# Patient Record
Sex: Male | Born: 1940
Health system: Southern US, Community
[De-identification: ages and names within clinical notes are randomized; demographics above are authoritative.]

## PROBLEM LIST (undated history)

## (undated) DIAGNOSIS — N289 Disorder of kidney and ureter, unspecified: Secondary | ICD-10-CM

## (undated) DIAGNOSIS — M109 Gout, unspecified: Secondary | ICD-10-CM

## (undated) DIAGNOSIS — F1910 Other psychoactive substance abuse, uncomplicated: Secondary | ICD-10-CM

## (undated) DIAGNOSIS — K469 Unspecified abdominal hernia without obstruction or gangrene: Secondary | ICD-10-CM

## (undated) DIAGNOSIS — Z950 Presence of cardiac pacemaker: Secondary | ICD-10-CM

## (undated) DIAGNOSIS — C801 Malignant (primary) neoplasm, unspecified: Secondary | ICD-10-CM

## (undated) DIAGNOSIS — K2270 Barrett's esophagus without dysplasia: Secondary | ICD-10-CM

## (undated) DIAGNOSIS — R011 Cardiac murmur, unspecified: Secondary | ICD-10-CM

## (undated) DIAGNOSIS — I4891 Unspecified atrial fibrillation: Secondary | ICD-10-CM

## (undated) DIAGNOSIS — J449 Chronic obstructive pulmonary disease, unspecified: Secondary | ICD-10-CM

## (undated) DIAGNOSIS — I10 Essential (primary) hypertension: Secondary | ICD-10-CM

## (undated) DIAGNOSIS — R51 Headache: Secondary | ICD-10-CM

## (undated) DIAGNOSIS — Z952 Presence of prosthetic heart valve: Secondary | ICD-10-CM

## (undated) DIAGNOSIS — N403 Nodular prostate with lower urinary tract symptoms: Secondary | ICD-10-CM

## (undated) DIAGNOSIS — E785 Hyperlipidemia, unspecified: Secondary | ICD-10-CM

## (undated) DIAGNOSIS — I499 Cardiac arrhythmia, unspecified: Secondary | ICD-10-CM

## (undated) DIAGNOSIS — R519 Headache, unspecified: Secondary | ICD-10-CM

## (undated) DIAGNOSIS — I509 Heart failure, unspecified: Secondary | ICD-10-CM

## (undated) DIAGNOSIS — I351 Nonrheumatic aortic (valve) insufficiency: Secondary | ICD-10-CM

## (undated) DIAGNOSIS — K5790 Diverticulosis of intestine, part unspecified, without perforation or abscess without bleeding: Secondary | ICD-10-CM

## (undated) DIAGNOSIS — R319 Hematuria, unspecified: Secondary | ICD-10-CM

## (undated) DIAGNOSIS — N138 Other obstructive and reflux uropathy: Secondary | ICD-10-CM

## (undated) DIAGNOSIS — Z95810 Presence of automatic (implantable) cardiac defibrillator: Secondary | ICD-10-CM

## (undated) DIAGNOSIS — K219 Gastro-esophageal reflux disease without esophagitis: Secondary | ICD-10-CM

## (undated) HISTORY — DX: Unspecified atrial fibrillation: I48.91

## (undated) HISTORY — DX: Gout, unspecified: M10.9

## (undated) HISTORY — DX: Essential (primary) hypertension: I10

## (undated) HISTORY — PX: INSERT / REPLACE / REMOVE PACEMAKER: SUR710

## (undated) HISTORY — DX: Barrett's esophagus without dysplasia: K22.70

## (undated) HISTORY — DX: Hematuria, unspecified: R31.9

## (undated) HISTORY — DX: Nodular prostate with lower urinary tract symptoms: N40.3

## (undated) HISTORY — DX: Chronic obstructive pulmonary disease, unspecified: J44.9

## (undated) HISTORY — PX: VASECTOMY: SHX75

## (undated) HISTORY — DX: Gastro-esophageal reflux disease without esophagitis: K21.9

## (undated) HISTORY — PX: CORONARY ANGIOPLASTY: SHX604

## (undated) HISTORY — DX: Diverticulosis of intestine, part unspecified, without perforation or abscess without bleeding: K57.90

## (undated) HISTORY — PX: EYE SURGERY: SHX253

## (undated) HISTORY — PX: FOOT SURGERY: SHX648

## (undated) HISTORY — DX: Other psychoactive substance abuse, uncomplicated: F19.10

## (undated) HISTORY — DX: Other obstructive and reflux uropathy: N13.8

## (undated) HISTORY — PX: FRACTURE SURGERY: SHX138

## (undated) HISTORY — DX: Nonrheumatic aortic (valve) insufficiency: I35.1

## (undated) HISTORY — DX: Hyperlipidemia, unspecified: E78.5

## (undated) HISTORY — DX: Presence of prosthetic heart valve: Z95.2

---

## 2002-11-29 HISTORY — PX: CARDIAC VALVE REPLACEMENT: SHX585

## 2007-02-15 ENCOUNTER — Ambulatory Visit (HOSPITAL_COMMUNITY): Admission: RE | Admit: 2007-02-15 | Discharge: 2007-02-15 | Payer: Self-pay | Admitting: *Deleted

## 2007-08-05 ENCOUNTER — Ambulatory Visit: Payer: Self-pay

## 2007-10-25 ENCOUNTER — Ambulatory Visit: Payer: Self-pay | Admitting: General Practice

## 2007-11-08 ENCOUNTER — Ambulatory Visit: Payer: Self-pay | Admitting: General Practice

## 2008-02-14 ENCOUNTER — Ambulatory Visit: Payer: Self-pay | Admitting: Unknown Physician Specialty

## 2008-09-23 ENCOUNTER — Inpatient Hospital Stay: Payer: Self-pay | Admitting: Cardiology

## 2009-01-06 ENCOUNTER — Inpatient Hospital Stay: Payer: Self-pay | Admitting: Cardiology

## 2009-08-29 ENCOUNTER — Ambulatory Visit: Payer: Self-pay | Admitting: Internal Medicine

## 2009-09-15 ENCOUNTER — Ambulatory Visit: Payer: Self-pay | Admitting: Internal Medicine

## 2009-09-29 ENCOUNTER — Ambulatory Visit: Payer: Self-pay | Admitting: Internal Medicine

## 2010-02-27 ENCOUNTER — Ambulatory Visit: Payer: Self-pay | Admitting: Internal Medicine

## 2010-03-16 ENCOUNTER — Ambulatory Visit: Payer: Self-pay | Admitting: Internal Medicine

## 2010-03-29 ENCOUNTER — Ambulatory Visit: Payer: Self-pay | Admitting: Internal Medicine

## 2011-03-17 ENCOUNTER — Ambulatory Visit: Payer: Self-pay | Admitting: Internal Medicine

## 2011-03-30 ENCOUNTER — Ambulatory Visit: Payer: Self-pay | Admitting: Internal Medicine

## 2011-09-06 ENCOUNTER — Ambulatory Visit: Payer: Self-pay | Admitting: Unknown Physician Specialty

## 2011-09-08 LAB — PATHOLOGY REPORT

## 2011-11-30 HISTORY — PX: PACEMAKER IMPLANT: EP1218

## 2011-12-02 DIAGNOSIS — H2510 Age-related nuclear cataract, unspecified eye: Secondary | ICD-10-CM | POA: Diagnosis not present

## 2011-12-09 DIAGNOSIS — M9981 Other biomechanical lesions of cervical region: Secondary | ICD-10-CM | POA: Diagnosis not present

## 2011-12-09 DIAGNOSIS — M5137 Other intervertebral disc degeneration, lumbosacral region: Secondary | ICD-10-CM | POA: Diagnosis not present

## 2011-12-09 DIAGNOSIS — M999 Biomechanical lesion, unspecified: Secondary | ICD-10-CM | POA: Diagnosis not present

## 2011-12-09 DIAGNOSIS — M5030 Other cervical disc degeneration, unspecified cervical region: Secondary | ICD-10-CM | POA: Diagnosis not present

## 2011-12-10 DIAGNOSIS — R609 Edema, unspecified: Secondary | ICD-10-CM | POA: Diagnosis not present

## 2011-12-10 DIAGNOSIS — D472 Monoclonal gammopathy: Secondary | ICD-10-CM | POA: Diagnosis not present

## 2011-12-10 DIAGNOSIS — I10 Essential (primary) hypertension: Secondary | ICD-10-CM | POA: Diagnosis not present

## 2011-12-13 DIAGNOSIS — M999 Biomechanical lesion, unspecified: Secondary | ICD-10-CM | POA: Diagnosis not present

## 2011-12-13 DIAGNOSIS — M5030 Other cervical disc degeneration, unspecified cervical region: Secondary | ICD-10-CM | POA: Diagnosis not present

## 2011-12-13 DIAGNOSIS — M5137 Other intervertebral disc degeneration, lumbosacral region: Secondary | ICD-10-CM | POA: Diagnosis not present

## 2011-12-13 DIAGNOSIS — M9981 Other biomechanical lesions of cervical region: Secondary | ICD-10-CM | POA: Diagnosis not present

## 2011-12-14 DIAGNOSIS — M9981 Other biomechanical lesions of cervical region: Secondary | ICD-10-CM | POA: Diagnosis not present

## 2011-12-14 DIAGNOSIS — M5030 Other cervical disc degeneration, unspecified cervical region: Secondary | ICD-10-CM | POA: Diagnosis not present

## 2011-12-14 DIAGNOSIS — M999 Biomechanical lesion, unspecified: Secondary | ICD-10-CM | POA: Diagnosis not present

## 2011-12-14 DIAGNOSIS — M5137 Other intervertebral disc degeneration, lumbosacral region: Secondary | ICD-10-CM | POA: Diagnosis not present

## 2011-12-15 DIAGNOSIS — M5030 Other cervical disc degeneration, unspecified cervical region: Secondary | ICD-10-CM | POA: Diagnosis not present

## 2011-12-15 DIAGNOSIS — M999 Biomechanical lesion, unspecified: Secondary | ICD-10-CM | POA: Diagnosis not present

## 2011-12-15 DIAGNOSIS — M5137 Other intervertebral disc degeneration, lumbosacral region: Secondary | ICD-10-CM | POA: Diagnosis not present

## 2011-12-15 DIAGNOSIS — M9981 Other biomechanical lesions of cervical region: Secondary | ICD-10-CM | POA: Diagnosis not present

## 2011-12-21 DIAGNOSIS — I4891 Unspecified atrial fibrillation: Secondary | ICD-10-CM | POA: Diagnosis not present

## 2011-12-21 DIAGNOSIS — Z954 Presence of other heart-valve replacement: Secondary | ICD-10-CM | POA: Diagnosis not present

## 2011-12-23 DIAGNOSIS — M5030 Other cervical disc degeneration, unspecified cervical region: Secondary | ICD-10-CM | POA: Diagnosis not present

## 2011-12-23 DIAGNOSIS — M5137 Other intervertebral disc degeneration, lumbosacral region: Secondary | ICD-10-CM | POA: Diagnosis not present

## 2011-12-23 DIAGNOSIS — M9981 Other biomechanical lesions of cervical region: Secondary | ICD-10-CM | POA: Diagnosis not present

## 2011-12-23 DIAGNOSIS — M999 Biomechanical lesion, unspecified: Secondary | ICD-10-CM | POA: Diagnosis not present

## 2011-12-24 DIAGNOSIS — M9981 Other biomechanical lesions of cervical region: Secondary | ICD-10-CM | POA: Diagnosis not present

## 2011-12-24 DIAGNOSIS — M5030 Other cervical disc degeneration, unspecified cervical region: Secondary | ICD-10-CM | POA: Diagnosis not present

## 2011-12-24 DIAGNOSIS — M5137 Other intervertebral disc degeneration, lumbosacral region: Secondary | ICD-10-CM | POA: Diagnosis not present

## 2011-12-24 DIAGNOSIS — M999 Biomechanical lesion, unspecified: Secondary | ICD-10-CM | POA: Diagnosis not present

## 2011-12-27 DIAGNOSIS — M999 Biomechanical lesion, unspecified: Secondary | ICD-10-CM | POA: Diagnosis not present

## 2011-12-27 DIAGNOSIS — M9981 Other biomechanical lesions of cervical region: Secondary | ICD-10-CM | POA: Diagnosis not present

## 2011-12-27 DIAGNOSIS — M5137 Other intervertebral disc degeneration, lumbosacral region: Secondary | ICD-10-CM | POA: Diagnosis not present

## 2011-12-27 DIAGNOSIS — M5030 Other cervical disc degeneration, unspecified cervical region: Secondary | ICD-10-CM | POA: Diagnosis not present

## 2011-12-29 DIAGNOSIS — M999 Biomechanical lesion, unspecified: Secondary | ICD-10-CM | POA: Diagnosis not present

## 2011-12-29 DIAGNOSIS — M9981 Other biomechanical lesions of cervical region: Secondary | ICD-10-CM | POA: Diagnosis not present

## 2011-12-29 DIAGNOSIS — M5030 Other cervical disc degeneration, unspecified cervical region: Secondary | ICD-10-CM | POA: Diagnosis not present

## 2011-12-29 DIAGNOSIS — M5137 Other intervertebral disc degeneration, lumbosacral region: Secondary | ICD-10-CM | POA: Diagnosis not present

## 2011-12-30 DIAGNOSIS — M5137 Other intervertebral disc degeneration, lumbosacral region: Secondary | ICD-10-CM | POA: Diagnosis not present

## 2011-12-30 DIAGNOSIS — M999 Biomechanical lesion, unspecified: Secondary | ICD-10-CM | POA: Diagnosis not present

## 2011-12-30 DIAGNOSIS — M5030 Other cervical disc degeneration, unspecified cervical region: Secondary | ICD-10-CM | POA: Diagnosis not present

## 2011-12-30 DIAGNOSIS — M9981 Other biomechanical lesions of cervical region: Secondary | ICD-10-CM | POA: Diagnosis not present

## 2012-01-03 DIAGNOSIS — M9981 Other biomechanical lesions of cervical region: Secondary | ICD-10-CM | POA: Diagnosis not present

## 2012-01-03 DIAGNOSIS — M5030 Other cervical disc degeneration, unspecified cervical region: Secondary | ICD-10-CM | POA: Diagnosis not present

## 2012-01-03 DIAGNOSIS — M5137 Other intervertebral disc degeneration, lumbosacral region: Secondary | ICD-10-CM | POA: Diagnosis not present

## 2012-01-03 DIAGNOSIS — M999 Biomechanical lesion, unspecified: Secondary | ICD-10-CM | POA: Diagnosis not present

## 2012-01-05 DIAGNOSIS — M999 Biomechanical lesion, unspecified: Secondary | ICD-10-CM | POA: Diagnosis not present

## 2012-01-05 DIAGNOSIS — M5030 Other cervical disc degeneration, unspecified cervical region: Secondary | ICD-10-CM | POA: Diagnosis not present

## 2012-01-05 DIAGNOSIS — M5137 Other intervertebral disc degeneration, lumbosacral region: Secondary | ICD-10-CM | POA: Diagnosis not present

## 2012-01-05 DIAGNOSIS — M9981 Other biomechanical lesions of cervical region: Secondary | ICD-10-CM | POA: Diagnosis not present

## 2012-01-06 DIAGNOSIS — M9981 Other biomechanical lesions of cervical region: Secondary | ICD-10-CM | POA: Diagnosis not present

## 2012-01-06 DIAGNOSIS — M999 Biomechanical lesion, unspecified: Secondary | ICD-10-CM | POA: Diagnosis not present

## 2012-01-06 DIAGNOSIS — M5137 Other intervertebral disc degeneration, lumbosacral region: Secondary | ICD-10-CM | POA: Diagnosis not present

## 2012-01-06 DIAGNOSIS — M5030 Other cervical disc degeneration, unspecified cervical region: Secondary | ICD-10-CM | POA: Diagnosis not present

## 2012-01-10 DIAGNOSIS — M5137 Other intervertebral disc degeneration, lumbosacral region: Secondary | ICD-10-CM | POA: Diagnosis not present

## 2012-01-10 DIAGNOSIS — M9981 Other biomechanical lesions of cervical region: Secondary | ICD-10-CM | POA: Diagnosis not present

## 2012-01-10 DIAGNOSIS — M5030 Other cervical disc degeneration, unspecified cervical region: Secondary | ICD-10-CM | POA: Diagnosis not present

## 2012-01-10 DIAGNOSIS — M999 Biomechanical lesion, unspecified: Secondary | ICD-10-CM | POA: Diagnosis not present

## 2012-01-10 DIAGNOSIS — Z95810 Presence of automatic (implantable) cardiac defibrillator: Secondary | ICD-10-CM | POA: Diagnosis not present

## 2012-01-12 DIAGNOSIS — M9981 Other biomechanical lesions of cervical region: Secondary | ICD-10-CM | POA: Diagnosis not present

## 2012-01-12 DIAGNOSIS — M999 Biomechanical lesion, unspecified: Secondary | ICD-10-CM | POA: Diagnosis not present

## 2012-01-12 DIAGNOSIS — M5137 Other intervertebral disc degeneration, lumbosacral region: Secondary | ICD-10-CM | POA: Diagnosis not present

## 2012-01-12 DIAGNOSIS — M5030 Other cervical disc degeneration, unspecified cervical region: Secondary | ICD-10-CM | POA: Diagnosis not present

## 2012-01-13 DIAGNOSIS — M5137 Other intervertebral disc degeneration, lumbosacral region: Secondary | ICD-10-CM | POA: Diagnosis not present

## 2012-01-13 DIAGNOSIS — M9981 Other biomechanical lesions of cervical region: Secondary | ICD-10-CM | POA: Diagnosis not present

## 2012-01-13 DIAGNOSIS — M5030 Other cervical disc degeneration, unspecified cervical region: Secondary | ICD-10-CM | POA: Diagnosis not present

## 2012-01-13 DIAGNOSIS — M999 Biomechanical lesion, unspecified: Secondary | ICD-10-CM | POA: Diagnosis not present

## 2012-01-17 DIAGNOSIS — M5030 Other cervical disc degeneration, unspecified cervical region: Secondary | ICD-10-CM | POA: Diagnosis not present

## 2012-01-17 DIAGNOSIS — M5137 Other intervertebral disc degeneration, lumbosacral region: Secondary | ICD-10-CM | POA: Diagnosis not present

## 2012-01-17 DIAGNOSIS — M9981 Other biomechanical lesions of cervical region: Secondary | ICD-10-CM | POA: Diagnosis not present

## 2012-01-17 DIAGNOSIS — M999 Biomechanical lesion, unspecified: Secondary | ICD-10-CM | POA: Diagnosis not present

## 2012-01-18 DIAGNOSIS — I4891 Unspecified atrial fibrillation: Secondary | ICD-10-CM | POA: Diagnosis not present

## 2012-01-18 DIAGNOSIS — Z954 Presence of other heart-valve replacement: Secondary | ICD-10-CM | POA: Diagnosis not present

## 2012-01-19 DIAGNOSIS — M5137 Other intervertebral disc degeneration, lumbosacral region: Secondary | ICD-10-CM | POA: Diagnosis not present

## 2012-01-19 DIAGNOSIS — M9981 Other biomechanical lesions of cervical region: Secondary | ICD-10-CM | POA: Diagnosis not present

## 2012-01-19 DIAGNOSIS — M5030 Other cervical disc degeneration, unspecified cervical region: Secondary | ICD-10-CM | POA: Diagnosis not present

## 2012-01-19 DIAGNOSIS — M999 Biomechanical lesion, unspecified: Secondary | ICD-10-CM | POA: Diagnosis not present

## 2012-01-20 DIAGNOSIS — M999 Biomechanical lesion, unspecified: Secondary | ICD-10-CM | POA: Diagnosis not present

## 2012-01-20 DIAGNOSIS — M5137 Other intervertebral disc degeneration, lumbosacral region: Secondary | ICD-10-CM | POA: Diagnosis not present

## 2012-01-20 DIAGNOSIS — M5030 Other cervical disc degeneration, unspecified cervical region: Secondary | ICD-10-CM | POA: Diagnosis not present

## 2012-01-20 DIAGNOSIS — M9981 Other biomechanical lesions of cervical region: Secondary | ICD-10-CM | POA: Diagnosis not present

## 2012-01-24 DIAGNOSIS — M5030 Other cervical disc degeneration, unspecified cervical region: Secondary | ICD-10-CM | POA: Diagnosis not present

## 2012-01-24 DIAGNOSIS — M9981 Other biomechanical lesions of cervical region: Secondary | ICD-10-CM | POA: Diagnosis not present

## 2012-01-24 DIAGNOSIS — M999 Biomechanical lesion, unspecified: Secondary | ICD-10-CM | POA: Diagnosis not present

## 2012-01-24 DIAGNOSIS — M5137 Other intervertebral disc degeneration, lumbosacral region: Secondary | ICD-10-CM | POA: Diagnosis not present

## 2012-01-26 DIAGNOSIS — M5137 Other intervertebral disc degeneration, lumbosacral region: Secondary | ICD-10-CM | POA: Diagnosis not present

## 2012-01-26 DIAGNOSIS — M9981 Other biomechanical lesions of cervical region: Secondary | ICD-10-CM | POA: Diagnosis not present

## 2012-01-26 DIAGNOSIS — M5030 Other cervical disc degeneration, unspecified cervical region: Secondary | ICD-10-CM | POA: Diagnosis not present

## 2012-01-26 DIAGNOSIS — M999 Biomechanical lesion, unspecified: Secondary | ICD-10-CM | POA: Diagnosis not present

## 2012-01-27 DIAGNOSIS — M5137 Other intervertebral disc degeneration, lumbosacral region: Secondary | ICD-10-CM | POA: Diagnosis not present

## 2012-01-27 DIAGNOSIS — M9981 Other biomechanical lesions of cervical region: Secondary | ICD-10-CM | POA: Diagnosis not present

## 2012-01-27 DIAGNOSIS — M5030 Other cervical disc degeneration, unspecified cervical region: Secondary | ICD-10-CM | POA: Diagnosis not present

## 2012-01-27 DIAGNOSIS — M999 Biomechanical lesion, unspecified: Secondary | ICD-10-CM | POA: Diagnosis not present

## 2012-01-31 DIAGNOSIS — M999 Biomechanical lesion, unspecified: Secondary | ICD-10-CM | POA: Diagnosis not present

## 2012-01-31 DIAGNOSIS — M5030 Other cervical disc degeneration, unspecified cervical region: Secondary | ICD-10-CM | POA: Diagnosis not present

## 2012-01-31 DIAGNOSIS — M9981 Other biomechanical lesions of cervical region: Secondary | ICD-10-CM | POA: Diagnosis not present

## 2012-01-31 DIAGNOSIS — M5137 Other intervertebral disc degeneration, lumbosacral region: Secondary | ICD-10-CM | POA: Diagnosis not present

## 2012-02-02 DIAGNOSIS — M999 Biomechanical lesion, unspecified: Secondary | ICD-10-CM | POA: Diagnosis not present

## 2012-02-02 DIAGNOSIS — M5030 Other cervical disc degeneration, unspecified cervical region: Secondary | ICD-10-CM | POA: Diagnosis not present

## 2012-02-02 DIAGNOSIS — M9981 Other biomechanical lesions of cervical region: Secondary | ICD-10-CM | POA: Diagnosis not present

## 2012-02-02 DIAGNOSIS — M5137 Other intervertebral disc degeneration, lumbosacral region: Secondary | ICD-10-CM | POA: Diagnosis not present

## 2012-02-03 DIAGNOSIS — M9981 Other biomechanical lesions of cervical region: Secondary | ICD-10-CM | POA: Diagnosis not present

## 2012-02-03 DIAGNOSIS — M999 Biomechanical lesion, unspecified: Secondary | ICD-10-CM | POA: Diagnosis not present

## 2012-02-03 DIAGNOSIS — M5137 Other intervertebral disc degeneration, lumbosacral region: Secondary | ICD-10-CM | POA: Diagnosis not present

## 2012-02-03 DIAGNOSIS — M5030 Other cervical disc degeneration, unspecified cervical region: Secondary | ICD-10-CM | POA: Diagnosis not present

## 2012-02-07 DIAGNOSIS — M999 Biomechanical lesion, unspecified: Secondary | ICD-10-CM | POA: Diagnosis not present

## 2012-02-07 DIAGNOSIS — M5030 Other cervical disc degeneration, unspecified cervical region: Secondary | ICD-10-CM | POA: Diagnosis not present

## 2012-02-07 DIAGNOSIS — M5137 Other intervertebral disc degeneration, lumbosacral region: Secondary | ICD-10-CM | POA: Diagnosis not present

## 2012-02-07 DIAGNOSIS — M9981 Other biomechanical lesions of cervical region: Secondary | ICD-10-CM | POA: Diagnosis not present

## 2012-02-09 DIAGNOSIS — M5137 Other intervertebral disc degeneration, lumbosacral region: Secondary | ICD-10-CM | POA: Diagnosis not present

## 2012-02-09 DIAGNOSIS — M999 Biomechanical lesion, unspecified: Secondary | ICD-10-CM | POA: Diagnosis not present

## 2012-02-09 DIAGNOSIS — M5030 Other cervical disc degeneration, unspecified cervical region: Secondary | ICD-10-CM | POA: Diagnosis not present

## 2012-02-09 DIAGNOSIS — M9981 Other biomechanical lesions of cervical region: Secondary | ICD-10-CM | POA: Diagnosis not present

## 2012-02-10 DIAGNOSIS — M5137 Other intervertebral disc degeneration, lumbosacral region: Secondary | ICD-10-CM | POA: Diagnosis not present

## 2012-02-10 DIAGNOSIS — M9981 Other biomechanical lesions of cervical region: Secondary | ICD-10-CM | POA: Diagnosis not present

## 2012-02-10 DIAGNOSIS — M999 Biomechanical lesion, unspecified: Secondary | ICD-10-CM | POA: Diagnosis not present

## 2012-02-10 DIAGNOSIS — M5030 Other cervical disc degeneration, unspecified cervical region: Secondary | ICD-10-CM | POA: Diagnosis not present

## 2012-02-14 DIAGNOSIS — M5137 Other intervertebral disc degeneration, lumbosacral region: Secondary | ICD-10-CM | POA: Diagnosis not present

## 2012-02-14 DIAGNOSIS — M9981 Other biomechanical lesions of cervical region: Secondary | ICD-10-CM | POA: Diagnosis not present

## 2012-02-14 DIAGNOSIS — M5030 Other cervical disc degeneration, unspecified cervical region: Secondary | ICD-10-CM | POA: Diagnosis not present

## 2012-02-14 DIAGNOSIS — M999 Biomechanical lesion, unspecified: Secondary | ICD-10-CM | POA: Diagnosis not present

## 2012-02-15 DIAGNOSIS — Z954 Presence of other heart-valve replacement: Secondary | ICD-10-CM | POA: Diagnosis not present

## 2012-02-15 DIAGNOSIS — I10 Essential (primary) hypertension: Secondary | ICD-10-CM | POA: Diagnosis not present

## 2012-02-16 DIAGNOSIS — M5030 Other cervical disc degeneration, unspecified cervical region: Secondary | ICD-10-CM | POA: Diagnosis not present

## 2012-02-16 DIAGNOSIS — M9981 Other biomechanical lesions of cervical region: Secondary | ICD-10-CM | POA: Diagnosis not present

## 2012-02-16 DIAGNOSIS — M999 Biomechanical lesion, unspecified: Secondary | ICD-10-CM | POA: Diagnosis not present

## 2012-02-16 DIAGNOSIS — M5137 Other intervertebral disc degeneration, lumbosacral region: Secondary | ICD-10-CM | POA: Diagnosis not present

## 2012-02-17 DIAGNOSIS — M999 Biomechanical lesion, unspecified: Secondary | ICD-10-CM | POA: Diagnosis not present

## 2012-02-17 DIAGNOSIS — M9981 Other biomechanical lesions of cervical region: Secondary | ICD-10-CM | POA: Diagnosis not present

## 2012-02-17 DIAGNOSIS — M5137 Other intervertebral disc degeneration, lumbosacral region: Secondary | ICD-10-CM | POA: Diagnosis not present

## 2012-02-17 DIAGNOSIS — M5030 Other cervical disc degeneration, unspecified cervical region: Secondary | ICD-10-CM | POA: Diagnosis not present

## 2012-02-28 DIAGNOSIS — E785 Hyperlipidemia, unspecified: Secondary | ICD-10-CM | POA: Diagnosis not present

## 2012-02-28 DIAGNOSIS — I10 Essential (primary) hypertension: Secondary | ICD-10-CM | POA: Diagnosis not present

## 2012-02-28 DIAGNOSIS — Z125 Encounter for screening for malignant neoplasm of prostate: Secondary | ICD-10-CM | POA: Diagnosis not present

## 2012-02-28 DIAGNOSIS — I4891 Unspecified atrial fibrillation: Secondary | ICD-10-CM | POA: Diagnosis not present

## 2012-02-28 DIAGNOSIS — Z0000 Encounter for general adult medical examination without abnormal findings: Secondary | ICD-10-CM | POA: Diagnosis not present

## 2012-03-15 ENCOUNTER — Ambulatory Visit: Payer: Self-pay | Admitting: Internal Medicine

## 2012-03-15 DIAGNOSIS — Z7982 Long term (current) use of aspirin: Secondary | ICD-10-CM | POA: Diagnosis not present

## 2012-03-15 DIAGNOSIS — I10 Essential (primary) hypertension: Secondary | ICD-10-CM | POA: Diagnosis not present

## 2012-03-15 DIAGNOSIS — D472 Monoclonal gammopathy: Secondary | ICD-10-CM | POA: Diagnosis not present

## 2012-03-15 DIAGNOSIS — E785 Hyperlipidemia, unspecified: Secondary | ICD-10-CM | POA: Diagnosis not present

## 2012-03-15 DIAGNOSIS — Z79899 Other long term (current) drug therapy: Secondary | ICD-10-CM | POA: Diagnosis not present

## 2012-03-15 DIAGNOSIS — Z87891 Personal history of nicotine dependence: Secondary | ICD-10-CM | POA: Diagnosis not present

## 2012-03-15 DIAGNOSIS — I2510 Atherosclerotic heart disease of native coronary artery without angina pectoris: Secondary | ICD-10-CM | POA: Diagnosis not present

## 2012-03-15 DIAGNOSIS — Z7901 Long term (current) use of anticoagulants: Secondary | ICD-10-CM | POA: Diagnosis not present

## 2012-03-15 DIAGNOSIS — Z8673 Personal history of transient ischemic attack (TIA), and cerebral infarction without residual deficits: Secondary | ICD-10-CM | POA: Diagnosis not present

## 2012-03-15 DIAGNOSIS — N289 Disorder of kidney and ureter, unspecified: Secondary | ICD-10-CM | POA: Diagnosis not present

## 2012-03-15 DIAGNOSIS — I4891 Unspecified atrial fibrillation: Secondary | ICD-10-CM | POA: Diagnosis not present

## 2012-03-15 LAB — CBC CANCER CENTER
Basophil #: 0.1 x10 3/mm (ref 0.0–0.1)
HCT: 37.3 % — ABNORMAL LOW (ref 40.0–52.0)
Lymphocyte #: 1.1 x10 3/mm (ref 1.0–3.6)
Lymphocyte %: 18.9 %
MCH: 31 pg (ref 26.0–34.0)
MCV: 97 fL (ref 80–100)
Platelet: 231 x10 3/mm (ref 150–440)
RDW: 14.3 % (ref 11.5–14.5)
WBC: 5.7 x10 3/mm (ref 3.8–10.6)

## 2012-03-29 ENCOUNTER — Ambulatory Visit: Payer: Self-pay | Admitting: Internal Medicine

## 2012-04-11 DIAGNOSIS — Z1159 Encounter for screening for other viral diseases: Secondary | ICD-10-CM | POA: Diagnosis not present

## 2012-04-11 DIAGNOSIS — I4891 Unspecified atrial fibrillation: Secondary | ICD-10-CM | POA: Diagnosis not present

## 2012-04-21 DIAGNOSIS — Z95810 Presence of automatic (implantable) cardiac defibrillator: Secondary | ICD-10-CM | POA: Diagnosis not present

## 2012-05-22 DIAGNOSIS — Z954 Presence of other heart-valve replacement: Secondary | ICD-10-CM | POA: Diagnosis not present

## 2012-05-22 DIAGNOSIS — I10 Essential (primary) hypertension: Secondary | ICD-10-CM | POA: Diagnosis not present

## 2012-05-22 DIAGNOSIS — E785 Hyperlipidemia, unspecified: Secondary | ICD-10-CM | POA: Diagnosis not present

## 2012-05-22 DIAGNOSIS — I4891 Unspecified atrial fibrillation: Secondary | ICD-10-CM | POA: Diagnosis not present

## 2012-06-06 DIAGNOSIS — I4891 Unspecified atrial fibrillation: Secondary | ICD-10-CM | POA: Diagnosis not present

## 2012-06-06 DIAGNOSIS — I10 Essential (primary) hypertension: Secondary | ICD-10-CM | POA: Diagnosis not present

## 2012-06-06 DIAGNOSIS — Z954 Presence of other heart-valve replacement: Secondary | ICD-10-CM | POA: Diagnosis not present

## 2012-06-06 DIAGNOSIS — E785 Hyperlipidemia, unspecified: Secondary | ICD-10-CM | POA: Diagnosis not present

## 2012-06-20 DIAGNOSIS — I4891 Unspecified atrial fibrillation: Secondary | ICD-10-CM | POA: Diagnosis not present

## 2012-06-20 DIAGNOSIS — Z954 Presence of other heart-valve replacement: Secondary | ICD-10-CM | POA: Diagnosis not present

## 2012-07-04 DIAGNOSIS — Z954 Presence of other heart-valve replacement: Secondary | ICD-10-CM | POA: Diagnosis not present

## 2012-07-04 DIAGNOSIS — I4891 Unspecified atrial fibrillation: Secondary | ICD-10-CM | POA: Diagnosis not present

## 2012-07-26 DIAGNOSIS — I519 Heart disease, unspecified: Secondary | ICD-10-CM | POA: Diagnosis not present

## 2012-07-26 DIAGNOSIS — I2510 Atherosclerotic heart disease of native coronary artery without angina pectoris: Secondary | ICD-10-CM | POA: Diagnosis not present

## 2012-07-26 DIAGNOSIS — I4891 Unspecified atrial fibrillation: Secondary | ICD-10-CM | POA: Diagnosis not present

## 2012-07-26 DIAGNOSIS — I359 Nonrheumatic aortic valve disorder, unspecified: Secondary | ICD-10-CM | POA: Diagnosis not present

## 2012-07-28 DIAGNOSIS — Z95810 Presence of automatic (implantable) cardiac defibrillator: Secondary | ICD-10-CM | POA: Diagnosis not present

## 2012-08-03 DIAGNOSIS — I4891 Unspecified atrial fibrillation: Secondary | ICD-10-CM | POA: Diagnosis not present

## 2012-08-03 DIAGNOSIS — Z954 Presence of other heart-valve replacement: Secondary | ICD-10-CM | POA: Diagnosis not present

## 2012-08-31 DIAGNOSIS — E785 Hyperlipidemia, unspecified: Secondary | ICD-10-CM | POA: Diagnosis not present

## 2012-08-31 DIAGNOSIS — Z125 Encounter for screening for malignant neoplasm of prostate: Secondary | ICD-10-CM | POA: Diagnosis not present

## 2012-08-31 DIAGNOSIS — Z954 Presence of other heart-valve replacement: Secondary | ICD-10-CM | POA: Diagnosis not present

## 2012-08-31 DIAGNOSIS — Z0000 Encounter for general adult medical examination without abnormal findings: Secondary | ICD-10-CM | POA: Diagnosis not present

## 2012-08-31 DIAGNOSIS — Z23 Encounter for immunization: Secondary | ICD-10-CM | POA: Diagnosis not present

## 2012-08-31 DIAGNOSIS — I10 Essential (primary) hypertension: Secondary | ICD-10-CM | POA: Diagnosis not present

## 2012-09-11 DIAGNOSIS — Z954 Presence of other heart-valve replacement: Secondary | ICD-10-CM | POA: Diagnosis not present

## 2012-09-25 DIAGNOSIS — Z954 Presence of other heart-valve replacement: Secondary | ICD-10-CM | POA: Diagnosis not present

## 2012-10-10 DIAGNOSIS — I10 Essential (primary) hypertension: Secondary | ICD-10-CM | POA: Diagnosis not present

## 2012-10-10 DIAGNOSIS — I4891 Unspecified atrial fibrillation: Secondary | ICD-10-CM | POA: Diagnosis not present

## 2012-10-10 DIAGNOSIS — Z954 Presence of other heart-valve replacement: Secondary | ICD-10-CM | POA: Diagnosis not present

## 2012-11-03 DIAGNOSIS — Z95810 Presence of automatic (implantable) cardiac defibrillator: Secondary | ICD-10-CM | POA: Diagnosis not present

## 2012-11-09 DIAGNOSIS — I4891 Unspecified atrial fibrillation: Secondary | ICD-10-CM | POA: Diagnosis not present

## 2012-11-13 DIAGNOSIS — Z954 Presence of other heart-valve replacement: Secondary | ICD-10-CM | POA: Diagnosis not present

## 2012-11-13 DIAGNOSIS — I4891 Unspecified atrial fibrillation: Secondary | ICD-10-CM | POA: Diagnosis not present

## 2012-11-15 DIAGNOSIS — L570 Actinic keratosis: Secondary | ICD-10-CM | POA: Diagnosis not present

## 2012-11-15 DIAGNOSIS — L219 Seborrheic dermatitis, unspecified: Secondary | ICD-10-CM | POA: Diagnosis not present

## 2012-11-15 DIAGNOSIS — Z85828 Personal history of other malignant neoplasm of skin: Secondary | ICD-10-CM | POA: Diagnosis not present

## 2012-11-20 DIAGNOSIS — I4891 Unspecified atrial fibrillation: Secondary | ICD-10-CM | POA: Diagnosis not present

## 2012-11-23 DIAGNOSIS — I4891 Unspecified atrial fibrillation: Secondary | ICD-10-CM | POA: Diagnosis not present

## 2012-11-23 DIAGNOSIS — Z954 Presence of other heart-valve replacement: Secondary | ICD-10-CM | POA: Diagnosis not present

## 2012-11-29 HISTORY — PX: COLONOSCOPY WITH PROPOFOL: SHX5780

## 2012-11-30 DIAGNOSIS — I4891 Unspecified atrial fibrillation: Secondary | ICD-10-CM | POA: Diagnosis not present

## 2012-12-01 DIAGNOSIS — I10 Essential (primary) hypertension: Secondary | ICD-10-CM | POA: Diagnosis not present

## 2012-12-01 DIAGNOSIS — R609 Edema, unspecified: Secondary | ICD-10-CM | POA: Diagnosis not present

## 2012-12-07 DIAGNOSIS — Z954 Presence of other heart-valve replacement: Secondary | ICD-10-CM | POA: Diagnosis not present

## 2012-12-07 DIAGNOSIS — I4891 Unspecified atrial fibrillation: Secondary | ICD-10-CM | POA: Diagnosis not present

## 2012-12-07 DIAGNOSIS — I10 Essential (primary) hypertension: Secondary | ICD-10-CM | POA: Diagnosis not present

## 2012-12-08 DIAGNOSIS — I4891 Unspecified atrial fibrillation: Secondary | ICD-10-CM | POA: Diagnosis not present

## 2012-12-08 DIAGNOSIS — I10 Essential (primary) hypertension: Secondary | ICD-10-CM | POA: Diagnosis not present

## 2013-01-10 DIAGNOSIS — I4891 Unspecified atrial fibrillation: Secondary | ICD-10-CM | POA: Diagnosis not present

## 2013-01-18 DIAGNOSIS — I2510 Atherosclerotic heart disease of native coronary artery without angina pectoris: Secondary | ICD-10-CM | POA: Diagnosis not present

## 2013-01-31 DIAGNOSIS — Z954 Presence of other heart-valve replacement: Secondary | ICD-10-CM | POA: Diagnosis not present

## 2013-02-06 DIAGNOSIS — Z954 Presence of other heart-valve replacement: Secondary | ICD-10-CM | POA: Diagnosis not present

## 2013-02-12 DIAGNOSIS — Z8673 Personal history of transient ischemic attack (TIA), and cerebral infarction without residual deficits: Secondary | ICD-10-CM | POA: Insufficient documentation

## 2013-02-13 DIAGNOSIS — Z954 Presence of other heart-valve replacement: Secondary | ICD-10-CM | POA: Diagnosis not present

## 2013-02-20 DIAGNOSIS — Z954 Presence of other heart-valve replacement: Secondary | ICD-10-CM | POA: Diagnosis not present

## 2013-02-27 DIAGNOSIS — E7800 Pure hypercholesterolemia, unspecified: Secondary | ICD-10-CM | POA: Diagnosis not present

## 2013-02-27 DIAGNOSIS — I2510 Atherosclerotic heart disease of native coronary artery without angina pectoris: Secondary | ICD-10-CM | POA: Diagnosis not present

## 2013-02-27 DIAGNOSIS — Z954 Presence of other heart-valve replacement: Secondary | ICD-10-CM | POA: Diagnosis not present

## 2013-02-27 DIAGNOSIS — I10 Essential (primary) hypertension: Secondary | ICD-10-CM | POA: Diagnosis not present

## 2013-02-27 DIAGNOSIS — E785 Hyperlipidemia, unspecified: Secondary | ICD-10-CM | POA: Diagnosis not present

## 2013-03-20 ENCOUNTER — Ambulatory Visit: Payer: Self-pay | Admitting: Internal Medicine

## 2013-03-26 DIAGNOSIS — Z954 Presence of other heart-valve replacement: Secondary | ICD-10-CM | POA: Diagnosis not present

## 2013-03-26 DIAGNOSIS — I10 Essential (primary) hypertension: Secondary | ICD-10-CM | POA: Diagnosis not present

## 2013-03-29 DIAGNOSIS — I4891 Unspecified atrial fibrillation: Secondary | ICD-10-CM | POA: Diagnosis not present

## 2013-03-29 DIAGNOSIS — I10 Essential (primary) hypertension: Secondary | ICD-10-CM | POA: Diagnosis not present

## 2013-04-12 DIAGNOSIS — I4891 Unspecified atrial fibrillation: Secondary | ICD-10-CM | POA: Diagnosis not present

## 2013-04-12 DIAGNOSIS — Z954 Presence of other heart-valve replacement: Secondary | ICD-10-CM | POA: Diagnosis not present

## 2013-04-19 ENCOUNTER — Ambulatory Visit: Payer: Self-pay | Admitting: Family Medicine

## 2013-04-19 DIAGNOSIS — J984 Other disorders of lung: Secondary | ICD-10-CM | POA: Diagnosis not present

## 2013-04-19 DIAGNOSIS — R0602 Shortness of breath: Secondary | ICD-10-CM | POA: Diagnosis not present

## 2013-04-19 DIAGNOSIS — J449 Chronic obstructive pulmonary disease, unspecified: Secondary | ICD-10-CM | POA: Diagnosis not present

## 2013-04-19 DIAGNOSIS — R911 Solitary pulmonary nodule: Secondary | ICD-10-CM | POA: Diagnosis not present

## 2013-04-19 DIAGNOSIS — I4891 Unspecified atrial fibrillation: Secondary | ICD-10-CM | POA: Diagnosis not present

## 2013-04-19 DIAGNOSIS — I10 Essential (primary) hypertension: Secondary | ICD-10-CM | POA: Diagnosis not present

## 2013-04-20 ENCOUNTER — Ambulatory Visit: Payer: Self-pay | Admitting: Family Medicine

## 2013-04-20 DIAGNOSIS — R918 Other nonspecific abnormal finding of lung field: Secondary | ICD-10-CM | POA: Diagnosis not present

## 2013-04-20 DIAGNOSIS — J90 Pleural effusion, not elsewhere classified: Secondary | ICD-10-CM | POA: Diagnosis not present

## 2013-04-24 DIAGNOSIS — R0989 Other specified symptoms and signs involving the circulatory and respiratory systems: Secondary | ICD-10-CM | POA: Diagnosis not present

## 2013-04-24 DIAGNOSIS — R609 Edema, unspecified: Secondary | ICD-10-CM | POA: Diagnosis not present

## 2013-04-24 DIAGNOSIS — I519 Heart disease, unspecified: Secondary | ICD-10-CM | POA: Diagnosis not present

## 2013-04-24 DIAGNOSIS — I359 Nonrheumatic aortic valve disorder, unspecified: Secondary | ICD-10-CM | POA: Diagnosis not present

## 2013-04-24 DIAGNOSIS — R0609 Other forms of dyspnea: Secondary | ICD-10-CM | POA: Diagnosis not present

## 2013-04-30 ENCOUNTER — Ambulatory Visit: Payer: Self-pay | Admitting: Internal Medicine

## 2013-04-30 DIAGNOSIS — K219 Gastro-esophageal reflux disease without esophagitis: Secondary | ICD-10-CM | POA: Diagnosis not present

## 2013-04-30 DIAGNOSIS — Z8673 Personal history of transient ischemic attack (TIA), and cerebral infarction without residual deficits: Secondary | ICD-10-CM | POA: Diagnosis not present

## 2013-04-30 DIAGNOSIS — N289 Disorder of kidney and ureter, unspecified: Secondary | ICD-10-CM | POA: Diagnosis not present

## 2013-04-30 DIAGNOSIS — I059 Rheumatic mitral valve disease, unspecified: Secondary | ICD-10-CM | POA: Diagnosis not present

## 2013-04-30 DIAGNOSIS — E785 Hyperlipidemia, unspecified: Secondary | ICD-10-CM | POA: Diagnosis not present

## 2013-04-30 DIAGNOSIS — I4891 Unspecified atrial fibrillation: Secondary | ICD-10-CM | POA: Diagnosis not present

## 2013-04-30 DIAGNOSIS — D472 Monoclonal gammopathy: Secondary | ICD-10-CM | POA: Diagnosis not present

## 2013-04-30 DIAGNOSIS — Z79899 Other long term (current) drug therapy: Secondary | ICD-10-CM | POA: Diagnosis not present

## 2013-04-30 DIAGNOSIS — I2510 Atherosclerotic heart disease of native coronary artery without angina pectoris: Secondary | ICD-10-CM | POA: Diagnosis not present

## 2013-04-30 DIAGNOSIS — Z954 Presence of other heart-valve replacement: Secondary | ICD-10-CM | POA: Diagnosis not present

## 2013-05-01 DIAGNOSIS — Z79899 Other long term (current) drug therapy: Secondary | ICD-10-CM | POA: Diagnosis not present

## 2013-05-01 DIAGNOSIS — N289 Disorder of kidney and ureter, unspecified: Secondary | ICD-10-CM | POA: Diagnosis not present

## 2013-05-01 DIAGNOSIS — I2510 Atherosclerotic heart disease of native coronary artery without angina pectoris: Secondary | ICD-10-CM | POA: Diagnosis not present

## 2013-05-01 DIAGNOSIS — E785 Hyperlipidemia, unspecified: Secondary | ICD-10-CM | POA: Diagnosis not present

## 2013-05-01 DIAGNOSIS — K219 Gastro-esophageal reflux disease without esophagitis: Secondary | ICD-10-CM | POA: Diagnosis not present

## 2013-05-01 DIAGNOSIS — I059 Rheumatic mitral valve disease, unspecified: Secondary | ICD-10-CM | POA: Diagnosis not present

## 2013-05-01 DIAGNOSIS — I4891 Unspecified atrial fibrillation: Secondary | ICD-10-CM | POA: Diagnosis not present

## 2013-05-01 DIAGNOSIS — D472 Monoclonal gammopathy: Secondary | ICD-10-CM | POA: Diagnosis not present

## 2013-05-01 LAB — CBC CANCER CENTER
MCHC: 32.7 g/dL (ref 32.0–36.0)
Neutrophil #: 4.2 x10 3/mm (ref 1.4–6.5)
RDW: 17 % — ABNORMAL HIGH (ref 11.5–14.5)
WBC: 6.3 x10 3/mm (ref 3.8–10.6)

## 2013-05-01 LAB — CREATININE, SERUM: Creatinine: 1.32 mg/dL — ABNORMAL HIGH (ref 0.60–1.30)

## 2013-05-04 DIAGNOSIS — I4891 Unspecified atrial fibrillation: Secondary | ICD-10-CM | POA: Diagnosis not present

## 2013-05-22 DIAGNOSIS — I472 Ventricular tachycardia: Secondary | ICD-10-CM | POA: Diagnosis not present

## 2013-05-29 ENCOUNTER — Ambulatory Visit: Payer: Self-pay | Admitting: Internal Medicine

## 2013-05-29 DIAGNOSIS — R0602 Shortness of breath: Secondary | ICD-10-CM | POA: Diagnosis not present

## 2013-05-31 DIAGNOSIS — Z954 Presence of other heart-valve replacement: Secondary | ICD-10-CM | POA: Diagnosis not present

## 2013-05-31 DIAGNOSIS — I4891 Unspecified atrial fibrillation: Secondary | ICD-10-CM | POA: Diagnosis not present

## 2013-06-05 DIAGNOSIS — I359 Nonrheumatic aortic valve disorder, unspecified: Secondary | ICD-10-CM | POA: Diagnosis not present

## 2013-06-05 DIAGNOSIS — I4891 Unspecified atrial fibrillation: Secondary | ICD-10-CM | POA: Diagnosis not present

## 2013-06-05 DIAGNOSIS — I2510 Atherosclerotic heart disease of native coronary artery without angina pectoris: Secondary | ICD-10-CM | POA: Diagnosis not present

## 2013-06-05 DIAGNOSIS — I519 Heart disease, unspecified: Secondary | ICD-10-CM | POA: Diagnosis not present

## 2013-06-28 DIAGNOSIS — Z954 Presence of other heart-valve replacement: Secondary | ICD-10-CM | POA: Diagnosis not present

## 2013-06-28 DIAGNOSIS — N401 Enlarged prostate with lower urinary tract symptoms: Secondary | ICD-10-CM | POA: Diagnosis not present

## 2013-06-28 DIAGNOSIS — I10 Essential (primary) hypertension: Secondary | ICD-10-CM | POA: Diagnosis not present

## 2013-07-06 DIAGNOSIS — Z954 Presence of other heart-valve replacement: Secondary | ICD-10-CM | POA: Diagnosis not present

## 2013-07-26 DIAGNOSIS — Z954 Presence of other heart-valve replacement: Secondary | ICD-10-CM | POA: Diagnosis not present

## 2013-07-27 DIAGNOSIS — J449 Chronic obstructive pulmonary disease, unspecified: Secondary | ICD-10-CM | POA: Diagnosis not present

## 2013-07-27 DIAGNOSIS — I10 Essential (primary) hypertension: Secondary | ICD-10-CM | POA: Diagnosis not present

## 2013-08-13 DIAGNOSIS — Z79899 Other long term (current) drug therapy: Secondary | ICD-10-CM | POA: Diagnosis not present

## 2013-08-13 DIAGNOSIS — I472 Ventricular tachycardia, unspecified: Secondary | ICD-10-CM | POA: Diagnosis not present

## 2013-08-13 DIAGNOSIS — Z954 Presence of other heart-valve replacement: Secondary | ICD-10-CM | POA: Diagnosis not present

## 2013-08-13 DIAGNOSIS — R9431 Abnormal electrocardiogram [ECG] [EKG]: Secondary | ICD-10-CM | POA: Diagnosis not present

## 2013-08-13 DIAGNOSIS — I4729 Other ventricular tachycardia: Secondary | ICD-10-CM | POA: Diagnosis not present

## 2013-08-13 DIAGNOSIS — Z4502 Encounter for adjustment and management of automatic implantable cardiac defibrillator: Secondary | ICD-10-CM | POA: Diagnosis not present

## 2013-08-13 DIAGNOSIS — I4891 Unspecified atrial fibrillation: Secondary | ICD-10-CM | POA: Diagnosis not present

## 2013-08-13 DIAGNOSIS — Z7901 Long term (current) use of anticoagulants: Secondary | ICD-10-CM | POA: Diagnosis not present

## 2013-08-13 DIAGNOSIS — I4510 Unspecified right bundle-branch block: Secondary | ICD-10-CM | POA: Diagnosis not present

## 2013-08-22 DIAGNOSIS — J449 Chronic obstructive pulmonary disease, unspecified: Secondary | ICD-10-CM | POA: Diagnosis not present

## 2013-08-22 DIAGNOSIS — I10 Essential (primary) hypertension: Secondary | ICD-10-CM | POA: Diagnosis not present

## 2013-08-22 DIAGNOSIS — I4891 Unspecified atrial fibrillation: Secondary | ICD-10-CM | POA: Diagnosis not present

## 2013-09-21 DIAGNOSIS — Z125 Encounter for screening for malignant neoplasm of prostate: Secondary | ICD-10-CM | POA: Diagnosis not present

## 2013-09-21 DIAGNOSIS — I10 Essential (primary) hypertension: Secondary | ICD-10-CM | POA: Diagnosis not present

## 2013-09-21 DIAGNOSIS — Z23 Encounter for immunization: Secondary | ICD-10-CM | POA: Diagnosis not present

## 2013-09-21 DIAGNOSIS — Z954 Presence of other heart-valve replacement: Secondary | ICD-10-CM | POA: Diagnosis not present

## 2013-09-21 DIAGNOSIS — I4891 Unspecified atrial fibrillation: Secondary | ICD-10-CM | POA: Diagnosis not present

## 2013-09-21 DIAGNOSIS — E785 Hyperlipidemia, unspecified: Secondary | ICD-10-CM | POA: Diagnosis not present

## 2013-09-21 DIAGNOSIS — Z0000 Encounter for general adult medical examination without abnormal findings: Secondary | ICD-10-CM | POA: Diagnosis not present

## 2013-09-21 DIAGNOSIS — J449 Chronic obstructive pulmonary disease, unspecified: Secondary | ICD-10-CM | POA: Diagnosis not present

## 2013-10-22 DIAGNOSIS — I4891 Unspecified atrial fibrillation: Secondary | ICD-10-CM | POA: Diagnosis not present

## 2013-10-22 DIAGNOSIS — Z954 Presence of other heart-valve replacement: Secondary | ICD-10-CM | POA: Diagnosis not present

## 2013-10-22 DIAGNOSIS — R7989 Other specified abnormal findings of blood chemistry: Secondary | ICD-10-CM | POA: Diagnosis not present

## 2013-10-26 ENCOUNTER — Other Ambulatory Visit: Payer: Self-pay | Admitting: Family Medicine

## 2013-10-26 DIAGNOSIS — D649 Anemia, unspecified: Secondary | ICD-10-CM | POA: Diagnosis not present

## 2013-10-26 DIAGNOSIS — Z952 Presence of prosthetic heart valve: Secondary | ICD-10-CM | POA: Diagnosis not present

## 2013-10-26 DIAGNOSIS — R5381 Other malaise: Secondary | ICD-10-CM | POA: Diagnosis not present

## 2013-10-26 LAB — CBC WITH DIFFERENTIAL/PLATELET
Basophil %: 1.7 %
HGB: 11.2 g/dL — ABNORMAL LOW (ref 13.0–18.0)
MCH: 26.7 pg (ref 26.0–34.0)
MCHC: 31.8 g/dL — ABNORMAL LOW (ref 32.0–36.0)
Monocyte %: 21 %
Neutrophil %: 56 %
Platelet: 231 10*3/uL (ref 150–440)
WBC: 5.5 10*3/uL (ref 3.8–10.6)

## 2013-10-26 LAB — PROTIME-INR: INR: 1.2

## 2013-10-29 DIAGNOSIS — I4891 Unspecified atrial fibrillation: Secondary | ICD-10-CM | POA: Diagnosis not present

## 2013-10-29 DIAGNOSIS — Z954 Presence of other heart-valve replacement: Secondary | ICD-10-CM | POA: Diagnosis not present

## 2013-10-30 DIAGNOSIS — Z954 Presence of other heart-valve replacement: Secondary | ICD-10-CM | POA: Diagnosis not present

## 2013-10-30 DIAGNOSIS — I4891 Unspecified atrial fibrillation: Secondary | ICD-10-CM | POA: Diagnosis not present

## 2013-10-31 ENCOUNTER — Other Ambulatory Visit: Payer: Self-pay | Admitting: Family Medicine

## 2013-10-31 DIAGNOSIS — Z7901 Long term (current) use of anticoagulants: Secondary | ICD-10-CM | POA: Diagnosis not present

## 2013-10-31 DIAGNOSIS — Z954 Presence of other heart-valve replacement: Secondary | ICD-10-CM | POA: Diagnosis not present

## 2013-10-31 LAB — PROTIME-INR
INR: 1.7
Prothrombin Time: 19.8 secs — ABNORMAL HIGH (ref 11.5–14.7)

## 2013-11-01 DIAGNOSIS — I4891 Unspecified atrial fibrillation: Secondary | ICD-10-CM | POA: Diagnosis not present

## 2013-11-08 DIAGNOSIS — I4891 Unspecified atrial fibrillation: Secondary | ICD-10-CM | POA: Diagnosis not present

## 2013-11-15 DIAGNOSIS — L570 Actinic keratosis: Secondary | ICD-10-CM | POA: Diagnosis not present

## 2013-11-15 DIAGNOSIS — Z85828 Personal history of other malignant neoplasm of skin: Secondary | ICD-10-CM | POA: Diagnosis not present

## 2013-11-16 DIAGNOSIS — I4891 Unspecified atrial fibrillation: Secondary | ICD-10-CM | POA: Diagnosis not present

## 2013-11-16 DIAGNOSIS — Z954 Presence of other heart-valve replacement: Secondary | ICD-10-CM | POA: Diagnosis not present

## 2013-11-26 DIAGNOSIS — I4891 Unspecified atrial fibrillation: Secondary | ICD-10-CM | POA: Diagnosis not present

## 2013-11-26 DIAGNOSIS — Z954 Presence of other heart-valve replacement: Secondary | ICD-10-CM | POA: Diagnosis not present

## 2013-11-27 DIAGNOSIS — I472 Ventricular tachycardia: Secondary | ICD-10-CM | POA: Diagnosis not present

## 2013-11-29 HISTORY — PX: CARPAL TUNNEL RELEASE: SHX101

## 2013-12-11 DIAGNOSIS — R0989 Other specified symptoms and signs involving the circulatory and respiratory systems: Secondary | ICD-10-CM | POA: Diagnosis not present

## 2013-12-11 DIAGNOSIS — I519 Heart disease, unspecified: Secondary | ICD-10-CM | POA: Diagnosis not present

## 2013-12-11 DIAGNOSIS — I5022 Chronic systolic (congestive) heart failure: Secondary | ICD-10-CM | POA: Diagnosis not present

## 2013-12-11 DIAGNOSIS — R0609 Other forms of dyspnea: Secondary | ICD-10-CM | POA: Diagnosis not present

## 2013-12-11 DIAGNOSIS — N183 Chronic kidney disease, stage 3 unspecified: Secondary | ICD-10-CM | POA: Diagnosis not present

## 2013-12-11 DIAGNOSIS — I359 Nonrheumatic aortic valve disorder, unspecified: Secondary | ICD-10-CM | POA: Diagnosis not present

## 2013-12-13 DIAGNOSIS — Z954 Presence of other heart-valve replacement: Secondary | ICD-10-CM | POA: Diagnosis not present

## 2013-12-13 DIAGNOSIS — I4891 Unspecified atrial fibrillation: Secondary | ICD-10-CM | POA: Diagnosis not present

## 2013-12-18 DIAGNOSIS — N182 Chronic kidney disease, stage 2 (mild): Secondary | ICD-10-CM | POA: Diagnosis not present

## 2013-12-18 DIAGNOSIS — I2510 Atherosclerotic heart disease of native coronary artery without angina pectoris: Secondary | ICD-10-CM | POA: Diagnosis not present

## 2013-12-18 DIAGNOSIS — I10 Essential (primary) hypertension: Secondary | ICD-10-CM | POA: Diagnosis not present

## 2014-01-14 DIAGNOSIS — I4891 Unspecified atrial fibrillation: Secondary | ICD-10-CM | POA: Diagnosis not present

## 2014-02-11 DIAGNOSIS — Z954 Presence of other heart-valve replacement: Secondary | ICD-10-CM | POA: Diagnosis not present

## 2014-02-11 DIAGNOSIS — I4891 Unspecified atrial fibrillation: Secondary | ICD-10-CM | POA: Diagnosis not present

## 2014-02-12 DIAGNOSIS — I10 Essential (primary) hypertension: Secondary | ICD-10-CM | POA: Diagnosis not present

## 2014-02-12 DIAGNOSIS — R609 Edema, unspecified: Secondary | ICD-10-CM | POA: Diagnosis not present

## 2014-02-12 DIAGNOSIS — N183 Chronic kidney disease, stage 3 unspecified: Secondary | ICD-10-CM | POA: Diagnosis not present

## 2014-02-12 DIAGNOSIS — D472 Monoclonal gammopathy: Secondary | ICD-10-CM | POA: Diagnosis not present

## 2014-02-12 DIAGNOSIS — Z954 Presence of other heart-valve replacement: Secondary | ICD-10-CM | POA: Diagnosis not present

## 2014-02-13 DIAGNOSIS — Z5181 Encounter for therapeutic drug level monitoring: Secondary | ICD-10-CM | POA: Diagnosis not present

## 2014-02-13 DIAGNOSIS — I4729 Other ventricular tachycardia: Secondary | ICD-10-CM | POA: Diagnosis not present

## 2014-02-13 DIAGNOSIS — I472 Ventricular tachycardia: Secondary | ICD-10-CM | POA: Diagnosis not present

## 2014-02-13 DIAGNOSIS — I452 Bifascicular block: Secondary | ICD-10-CM | POA: Diagnosis not present

## 2014-02-13 DIAGNOSIS — Z79899 Other long term (current) drug therapy: Secondary | ICD-10-CM | POA: Diagnosis not present

## 2014-02-13 DIAGNOSIS — Z954 Presence of other heart-valve replacement: Secondary | ICD-10-CM | POA: Diagnosis not present

## 2014-02-13 DIAGNOSIS — Z4502 Encounter for adjustment and management of automatic implantable cardiac defibrillator: Secondary | ICD-10-CM | POA: Diagnosis not present

## 2014-02-13 DIAGNOSIS — I4891 Unspecified atrial fibrillation: Secondary | ICD-10-CM | POA: Diagnosis not present

## 2014-02-19 DIAGNOSIS — I4891 Unspecified atrial fibrillation: Secondary | ICD-10-CM | POA: Diagnosis not present

## 2014-02-25 DIAGNOSIS — Z954 Presence of other heart-valve replacement: Secondary | ICD-10-CM | POA: Diagnosis not present

## 2014-02-25 DIAGNOSIS — N183 Chronic kidney disease, stage 3 unspecified: Secondary | ICD-10-CM | POA: Diagnosis not present

## 2014-02-25 DIAGNOSIS — I4891 Unspecified atrial fibrillation: Secondary | ICD-10-CM | POA: Diagnosis not present

## 2014-03-11 DIAGNOSIS — I4891 Unspecified atrial fibrillation: Secondary | ICD-10-CM | POA: Diagnosis not present

## 2014-03-13 DIAGNOSIS — I4891 Unspecified atrial fibrillation: Secondary | ICD-10-CM | POA: Diagnosis not present

## 2014-03-15 DIAGNOSIS — H2510 Age-related nuclear cataract, unspecified eye: Secondary | ICD-10-CM | POA: Diagnosis not present

## 2014-03-20 DIAGNOSIS — E785 Hyperlipidemia, unspecified: Secondary | ICD-10-CM | POA: Diagnosis not present

## 2014-03-20 DIAGNOSIS — I10 Essential (primary) hypertension: Secondary | ICD-10-CM | POA: Diagnosis not present

## 2014-03-20 DIAGNOSIS — Z954 Presence of other heart-valve replacement: Secondary | ICD-10-CM | POA: Diagnosis not present

## 2014-03-20 DIAGNOSIS — I4891 Unspecified atrial fibrillation: Secondary | ICD-10-CM | POA: Diagnosis not present

## 2014-04-15 DIAGNOSIS — I471 Supraventricular tachycardia: Secondary | ICD-10-CM | POA: Diagnosis not present

## 2014-04-15 DIAGNOSIS — I4891 Unspecified atrial fibrillation: Secondary | ICD-10-CM | POA: Diagnosis not present

## 2014-04-16 DIAGNOSIS — Z954 Presence of other heart-valve replacement: Secondary | ICD-10-CM | POA: Diagnosis not present

## 2014-04-17 DIAGNOSIS — Z954 Presence of other heart-valve replacement: Secondary | ICD-10-CM | POA: Diagnosis not present

## 2014-04-19 DIAGNOSIS — Z954 Presence of other heart-valve replacement: Secondary | ICD-10-CM | POA: Diagnosis not present

## 2014-04-23 DIAGNOSIS — Z954 Presence of other heart-valve replacement: Secondary | ICD-10-CM | POA: Diagnosis not present

## 2014-04-30 DIAGNOSIS — Z954 Presence of other heart-valve replacement: Secondary | ICD-10-CM | POA: Diagnosis not present

## 2014-05-01 ENCOUNTER — Ambulatory Visit: Payer: Self-pay | Admitting: Internal Medicine

## 2014-05-01 DIAGNOSIS — Z7982 Long term (current) use of aspirin: Secondary | ICD-10-CM | POA: Diagnosis not present

## 2014-05-01 DIAGNOSIS — Z79899 Other long term (current) drug therapy: Secondary | ICD-10-CM | POA: Diagnosis not present

## 2014-05-01 DIAGNOSIS — Z8673 Personal history of transient ischemic attack (TIA), and cerebral infarction without residual deficits: Secondary | ICD-10-CM | POA: Diagnosis not present

## 2014-05-01 DIAGNOSIS — E785 Hyperlipidemia, unspecified: Secondary | ICD-10-CM | POA: Diagnosis not present

## 2014-05-01 DIAGNOSIS — D472 Monoclonal gammopathy: Secondary | ICD-10-CM | POA: Diagnosis not present

## 2014-05-01 DIAGNOSIS — K219 Gastro-esophageal reflux disease without esophagitis: Secondary | ICD-10-CM | POA: Diagnosis not present

## 2014-05-01 DIAGNOSIS — I10 Essential (primary) hypertension: Secondary | ICD-10-CM | POA: Diagnosis not present

## 2014-05-01 DIAGNOSIS — I2510 Atherosclerotic heart disease of native coronary artery without angina pectoris: Secondary | ICD-10-CM | POA: Diagnosis not present

## 2014-05-01 LAB — CBC CANCER CENTER
BASOS ABS: 0.1 x10 3/mm (ref 0.0–0.1)
Basophil %: 2 %
EOS PCT: 1.6 %
Eosinophil #: 0.1 x10 3/mm (ref 0.0–0.7)
HCT: 44.6 % (ref 40.0–52.0)
HGB: 14.8 g/dL (ref 13.0–18.0)
LYMPHS ABS: 1.1 x10 3/mm (ref 1.0–3.6)
Lymphocyte %: 20.2 %
MCH: 36 pg — AB (ref 26.0–34.0)
MCHC: 33.2 g/dL (ref 32.0–36.0)
MCV: 108 fL — AB (ref 80–100)
MONO ABS: 0.7 x10 3/mm (ref 0.2–1.0)
MONOS PCT: 13.3 %
Neutrophil #: 3.4 x10 3/mm (ref 1.4–6.5)
Neutrophil %: 62.9 %
PLATELETS: 212 x10 3/mm (ref 150–440)
RBC: 4.11 10*6/uL — ABNORMAL LOW (ref 4.40–5.90)
RDW: 15.1 % — ABNORMAL HIGH (ref 11.5–14.5)
WBC: 5.5 x10 3/mm (ref 3.8–10.6)

## 2014-05-01 LAB — CREATININE, SERUM: CREATININE: 1.02 mg/dL (ref 0.60–1.30)

## 2014-05-01 LAB — CALCIUM: Calcium, Total: 9.6 mg/dL (ref 8.5–10.1)

## 2014-05-06 LAB — PROT IMMUNOELECTROPHORES(ARMC)

## 2014-05-14 DIAGNOSIS — Z954 Presence of other heart-valve replacement: Secondary | ICD-10-CM | POA: Diagnosis not present

## 2014-05-15 DIAGNOSIS — I4729 Other ventricular tachycardia: Secondary | ICD-10-CM | POA: Diagnosis not present

## 2014-05-15 DIAGNOSIS — I472 Ventricular tachycardia: Secondary | ICD-10-CM | POA: Diagnosis not present

## 2014-05-23 DIAGNOSIS — I4891 Unspecified atrial fibrillation: Secondary | ICD-10-CM | POA: Diagnosis not present

## 2014-05-23 DIAGNOSIS — Z954 Presence of other heart-valve replacement: Secondary | ICD-10-CM | POA: Diagnosis not present

## 2014-05-29 ENCOUNTER — Ambulatory Visit: Payer: Self-pay | Admitting: Internal Medicine

## 2014-06-05 DIAGNOSIS — Z954 Presence of other heart-valve replacement: Secondary | ICD-10-CM | POA: Diagnosis not present

## 2014-06-13 DIAGNOSIS — Z954 Presence of other heart-valve replacement: Secondary | ICD-10-CM | POA: Diagnosis not present

## 2014-06-24 DIAGNOSIS — Z954 Presence of other heart-valve replacement: Secondary | ICD-10-CM | POA: Diagnosis not present

## 2014-07-04 DIAGNOSIS — I4891 Unspecified atrial fibrillation: Secondary | ICD-10-CM | POA: Diagnosis not present

## 2014-07-05 DIAGNOSIS — I5020 Unspecified systolic (congestive) heart failure: Secondary | ICD-10-CM | POA: Insufficient documentation

## 2014-07-05 DIAGNOSIS — I639 Cerebral infarction, unspecified: Secondary | ICD-10-CM | POA: Insufficient documentation

## 2014-07-08 DIAGNOSIS — I5032 Chronic diastolic (congestive) heart failure: Secondary | ICD-10-CM | POA: Diagnosis not present

## 2014-07-08 DIAGNOSIS — I4891 Unspecified atrial fibrillation: Secondary | ICD-10-CM | POA: Diagnosis not present

## 2014-07-08 DIAGNOSIS — I10 Essential (primary) hypertension: Secondary | ICD-10-CM | POA: Diagnosis not present

## 2014-07-08 DIAGNOSIS — I2510 Atherosclerotic heart disease of native coronary artery without angina pectoris: Secondary | ICD-10-CM | POA: Diagnosis not present

## 2014-07-11 DIAGNOSIS — Z954 Presence of other heart-valve replacement: Secondary | ICD-10-CM | POA: Diagnosis not present

## 2014-07-11 DIAGNOSIS — Z7901 Long term (current) use of anticoagulants: Secondary | ICD-10-CM | POA: Diagnosis not present

## 2014-07-22 DIAGNOSIS — Z954 Presence of other heart-valve replacement: Secondary | ICD-10-CM | POA: Diagnosis not present

## 2014-07-26 DIAGNOSIS — N183 Chronic kidney disease, stage 3 unspecified: Secondary | ICD-10-CM | POA: Diagnosis not present

## 2014-08-01 DIAGNOSIS — I10 Essential (primary) hypertension: Secondary | ICD-10-CM | POA: Diagnosis not present

## 2014-08-01 DIAGNOSIS — R809 Proteinuria, unspecified: Secondary | ICD-10-CM | POA: Diagnosis not present

## 2014-08-01 DIAGNOSIS — N183 Chronic kidney disease, stage 3 unspecified: Secondary | ICD-10-CM | POA: Diagnosis not present

## 2014-08-01 DIAGNOSIS — D472 Monoclonal gammopathy: Secondary | ICD-10-CM | POA: Diagnosis not present

## 2014-08-01 DIAGNOSIS — I4891 Unspecified atrial fibrillation: Secondary | ICD-10-CM | POA: Diagnosis not present

## 2014-08-01 DIAGNOSIS — R609 Edema, unspecified: Secondary | ICD-10-CM | POA: Diagnosis not present

## 2014-08-06 DIAGNOSIS — Z954 Presence of other heart-valve replacement: Secondary | ICD-10-CM | POA: Diagnosis not present

## 2014-08-06 DIAGNOSIS — Z23 Encounter for immunization: Secondary | ICD-10-CM | POA: Diagnosis not present

## 2014-08-06 DIAGNOSIS — IMO0002 Reserved for concepts with insufficient information to code with codable children: Secondary | ICD-10-CM | POA: Diagnosis not present

## 2014-08-20 DIAGNOSIS — Z95810 Presence of automatic (implantable) cardiac defibrillator: Secondary | ICD-10-CM | POA: Diagnosis not present

## 2014-08-20 DIAGNOSIS — I472 Ventricular tachycardia: Secondary | ICD-10-CM | POA: Diagnosis not present

## 2014-08-20 DIAGNOSIS — I4891 Unspecified atrial fibrillation: Secondary | ICD-10-CM | POA: Diagnosis not present

## 2014-08-20 DIAGNOSIS — I4729 Other ventricular tachycardia: Secondary | ICD-10-CM | POA: Diagnosis not present

## 2014-08-27 DIAGNOSIS — M25539 Pain in unspecified wrist: Secondary | ICD-10-CM | POA: Diagnosis not present

## 2014-08-27 DIAGNOSIS — M5412 Radiculopathy, cervical region: Secondary | ICD-10-CM | POA: Diagnosis not present

## 2014-08-27 DIAGNOSIS — G5600 Carpal tunnel syndrome, unspecified upper limb: Secondary | ICD-10-CM | POA: Diagnosis not present

## 2014-08-30 DIAGNOSIS — G5601 Carpal tunnel syndrome, right upper limb: Secondary | ICD-10-CM | POA: Diagnosis not present

## 2014-08-30 DIAGNOSIS — G5602 Carpal tunnel syndrome, left upper limb: Secondary | ICD-10-CM | POA: Diagnosis not present

## 2014-09-03 DIAGNOSIS — Z95810 Presence of automatic (implantable) cardiac defibrillator: Secondary | ICD-10-CM | POA: Diagnosis not present

## 2014-09-03 DIAGNOSIS — I481 Persistent atrial fibrillation: Secondary | ICD-10-CM | POA: Diagnosis not present

## 2014-09-05 DIAGNOSIS — Z953 Presence of xenogenic heart valve: Secondary | ICD-10-CM | POA: Diagnosis not present

## 2014-09-05 DIAGNOSIS — Z7901 Long term (current) use of anticoagulants: Secondary | ICD-10-CM | POA: Diagnosis not present

## 2014-09-05 DIAGNOSIS — Z952 Presence of prosthetic heart valve: Secondary | ICD-10-CM | POA: Diagnosis not present

## 2014-09-17 DIAGNOSIS — G5601 Carpal tunnel syndrome, right upper limb: Secondary | ICD-10-CM | POA: Diagnosis not present

## 2014-09-19 DIAGNOSIS — I4891 Unspecified atrial fibrillation: Secondary | ICD-10-CM | POA: Diagnosis not present

## 2014-09-26 DIAGNOSIS — Z953 Presence of xenogenic heart valve: Secondary | ICD-10-CM | POA: Diagnosis not present

## 2014-10-02 DIAGNOSIS — E785 Hyperlipidemia, unspecified: Secondary | ICD-10-CM | POA: Diagnosis not present

## 2014-10-02 DIAGNOSIS — Z125 Encounter for screening for malignant neoplasm of prostate: Secondary | ICD-10-CM | POA: Diagnosis not present

## 2014-10-02 DIAGNOSIS — Z7901 Long term (current) use of anticoagulants: Secondary | ICD-10-CM | POA: Diagnosis not present

## 2014-10-02 DIAGNOSIS — N183 Chronic kidney disease, stage 3 (moderate): Secondary | ICD-10-CM | POA: Diagnosis not present

## 2014-10-02 DIAGNOSIS — Z0000 Encounter for general adult medical examination without abnormal findings: Secondary | ICD-10-CM | POA: Diagnosis not present

## 2014-10-02 DIAGNOSIS — I10 Essential (primary) hypertension: Secondary | ICD-10-CM | POA: Diagnosis not present

## 2014-10-02 DIAGNOSIS — Z23 Encounter for immunization: Secondary | ICD-10-CM | POA: Diagnosis not present

## 2014-10-02 DIAGNOSIS — I4891 Unspecified atrial fibrillation: Secondary | ICD-10-CM | POA: Diagnosis not present

## 2014-10-09 DIAGNOSIS — M109 Gout, unspecified: Secondary | ICD-10-CM | POA: Diagnosis not present

## 2014-10-09 DIAGNOSIS — I4891 Unspecified atrial fibrillation: Secondary | ICD-10-CM | POA: Diagnosis not present

## 2014-10-09 DIAGNOSIS — Z952 Presence of prosthetic heart valve: Secondary | ICD-10-CM | POA: Diagnosis not present

## 2014-10-22 ENCOUNTER — Ambulatory Visit: Payer: Self-pay | Admitting: General Practice

## 2014-10-22 DIAGNOSIS — Z01812 Encounter for preprocedural laboratory examination: Secondary | ICD-10-CM | POA: Diagnosis not present

## 2014-10-22 DIAGNOSIS — G5601 Carpal tunnel syndrome, right upper limb: Secondary | ICD-10-CM | POA: Diagnosis not present

## 2014-10-22 LAB — CBC WITH DIFFERENTIAL/PLATELET
BASOS PCT: 0.5 %
Basophil #: 0.1 10*3/uL (ref 0.0–0.1)
Eosinophil #: 0 10*3/uL (ref 0.0–0.7)
Eosinophil %: 0.1 %
HCT: 47.2 % (ref 40.0–52.0)
HGB: 15.6 g/dL (ref 13.0–18.0)
LYMPHS ABS: 0.4 10*3/uL — AB (ref 1.0–3.6)
LYMPHS PCT: 3.1 %
MCH: 34.7 pg — AB (ref 26.0–34.0)
MCHC: 33 g/dL (ref 32.0–36.0)
MCV: 105 fL — AB (ref 80–100)
MONO ABS: 1.1 x10 3/mm — AB (ref 0.2–1.0)
Monocyte %: 8 %
NEUTROS ABS: 12.6 10*3/uL — AB (ref 1.4–6.5)
NEUTROS PCT: 88.3 %
Platelet: 203 10*3/uL (ref 150–440)
RBC: 4.49 10*6/uL (ref 4.40–5.90)
RDW: 14.5 % (ref 11.5–14.5)
WBC: 14.3 10*3/uL — AB (ref 3.8–10.6)

## 2014-10-22 LAB — BASIC METABOLIC PANEL
ANION GAP: 7 (ref 7–16)
BUN: 21 mg/dL — AB (ref 7–18)
CALCIUM: 9 mg/dL (ref 8.5–10.1)
CO2: 32 mmol/L (ref 21–32)
Chloride: 96 mmol/L — ABNORMAL LOW (ref 98–107)
Creatinine: 1.21 mg/dL (ref 0.60–1.30)
EGFR (Non-African Amer.): >60
Glucose: 99 mg/dL (ref 65–99)
OSMOLALITY: 273 (ref 275–301)
POTASSIUM: 4.3 mmol/L (ref 3.5–5.1)
Sodium: 135 mmol/L — ABNORMAL LOW (ref 136–145)

## 2014-10-22 LAB — PROTIME-INR
INR: 6.7 — AB
PROTHROMBIN TIME: 55.9 s — AB (ref 11.5–14.7)

## 2014-10-25 ENCOUNTER — Ambulatory Visit: Payer: Self-pay | Admitting: Family Medicine

## 2014-10-25 DIAGNOSIS — R140 Abdominal distension (gaseous): Secondary | ICD-10-CM | POA: Diagnosis not present

## 2014-10-25 DIAGNOSIS — R6883 Chills (without fever): Secondary | ICD-10-CM | POA: Diagnosis not present

## 2014-10-25 DIAGNOSIS — I4891 Unspecified atrial fibrillation: Secondary | ICD-10-CM | POA: Diagnosis not present

## 2014-10-25 DIAGNOSIS — R1010 Upper abdominal pain, unspecified: Secondary | ICD-10-CM | POA: Diagnosis not present

## 2014-10-25 DIAGNOSIS — R1012 Left upper quadrant pain: Secondary | ICD-10-CM | POA: Diagnosis not present

## 2014-10-28 DIAGNOSIS — I4891 Unspecified atrial fibrillation: Secondary | ICD-10-CM | POA: Diagnosis not present

## 2014-10-31 ENCOUNTER — Ambulatory Visit: Payer: Self-pay | Admitting: Family Medicine

## 2014-10-31 DIAGNOSIS — R945 Abnormal results of liver function studies: Secondary | ICD-10-CM | POA: Diagnosis not present

## 2014-10-31 DIAGNOSIS — R740 Nonspecific elevation of levels of transaminase and lactic acid dehydrogenase [LDH]: Secondary | ICD-10-CM | POA: Diagnosis not present

## 2014-10-31 DIAGNOSIS — R32 Unspecified urinary incontinence: Secondary | ICD-10-CM | POA: Diagnosis not present

## 2014-10-31 DIAGNOSIS — R748 Abnormal levels of other serum enzymes: Secondary | ICD-10-CM | POA: Diagnosis not present

## 2014-10-31 DIAGNOSIS — R7989 Other specified abnormal findings of blood chemistry: Secondary | ICD-10-CM | POA: Diagnosis not present

## 2014-10-31 DIAGNOSIS — K7689 Other specified diseases of liver: Secondary | ICD-10-CM | POA: Diagnosis not present

## 2014-10-31 DIAGNOSIS — I4891 Unspecified atrial fibrillation: Secondary | ICD-10-CM | POA: Diagnosis not present

## 2014-10-31 DIAGNOSIS — K7460 Unspecified cirrhosis of liver: Secondary | ICD-10-CM | POA: Diagnosis not present

## 2014-11-04 ENCOUNTER — Ambulatory Visit: Payer: Self-pay | Admitting: General Practice

## 2014-11-04 ENCOUNTER — Inpatient Hospital Stay: Payer: Self-pay | Admitting: Internal Medicine

## 2014-11-04 DIAGNOSIS — R748 Abnormal levels of other serum enzymes: Secondary | ICD-10-CM | POA: Diagnosis present

## 2014-11-04 DIAGNOSIS — K859 Acute pancreatitis, unspecified: Secondary | ICD-10-CM | POA: Diagnosis not present

## 2014-11-04 DIAGNOSIS — K219 Gastro-esophageal reflux disease without esophagitis: Secondary | ICD-10-CM | POA: Diagnosis present

## 2014-11-04 DIAGNOSIS — Z7901 Long term (current) use of anticoagulants: Secondary | ICD-10-CM | POA: Diagnosis not present

## 2014-11-04 DIAGNOSIS — E876 Hypokalemia: Secondary | ICD-10-CM | POA: Diagnosis present

## 2014-11-04 DIAGNOSIS — I4891 Unspecified atrial fibrillation: Secondary | ICD-10-CM | POA: Diagnosis present

## 2014-11-04 DIAGNOSIS — G5601 Carpal tunnel syndrome, right upper limb: Secondary | ICD-10-CM | POA: Diagnosis not present

## 2014-11-04 DIAGNOSIS — M1990 Unspecified osteoarthritis, unspecified site: Secondary | ICD-10-CM | POA: Diagnosis present

## 2014-11-04 DIAGNOSIS — N390 Urinary tract infection, site not specified: Secondary | ICD-10-CM | POA: Diagnosis present

## 2014-11-04 DIAGNOSIS — D472 Monoclonal gammopathy: Secondary | ICD-10-CM | POA: Diagnosis not present

## 2014-11-04 DIAGNOSIS — I340 Nonrheumatic mitral (valve) insufficiency: Secondary | ICD-10-CM | POA: Diagnosis present

## 2014-11-04 DIAGNOSIS — I129 Hypertensive chronic kidney disease with stage 1 through stage 4 chronic kidney disease, or unspecified chronic kidney disease: Secondary | ICD-10-CM | POA: Diagnosis present

## 2014-11-04 DIAGNOSIS — Z801 Family history of malignant neoplasm of trachea, bronchus and lung: Secondary | ICD-10-CM | POA: Diagnosis not present

## 2014-11-04 DIAGNOSIS — Z952 Presence of prosthetic heart valve: Secondary | ICD-10-CM | POA: Diagnosis not present

## 2014-11-04 DIAGNOSIS — N182 Chronic kidney disease, stage 2 (mild): Secondary | ICD-10-CM | POA: Diagnosis not present

## 2014-11-04 DIAGNOSIS — Z8249 Family history of ischemic heart disease and other diseases of the circulatory system: Secondary | ICD-10-CM | POA: Diagnosis not present

## 2014-11-04 DIAGNOSIS — J449 Chronic obstructive pulmonary disease, unspecified: Secondary | ICD-10-CM | POA: Diagnosis present

## 2014-11-04 DIAGNOSIS — I2510 Atherosclerotic heart disease of native coronary artery without angina pectoris: Secondary | ICD-10-CM | POA: Diagnosis present

## 2014-11-04 DIAGNOSIS — E871 Hypo-osmolality and hyponatremia: Secondary | ICD-10-CM | POA: Diagnosis present

## 2014-11-04 DIAGNOSIS — B179 Acute viral hepatitis, unspecified: Secondary | ICD-10-CM | POA: Diagnosis not present

## 2014-11-04 DIAGNOSIS — K7030 Alcoholic cirrhosis of liver without ascites: Secondary | ICD-10-CM | POA: Diagnosis not present

## 2014-11-04 DIAGNOSIS — D759 Disease of blood and blood-forming organs, unspecified: Secondary | ICD-10-CM | POA: Diagnosis present

## 2014-11-04 DIAGNOSIS — N19 Unspecified kidney failure: Secondary | ICD-10-CM | POA: Diagnosis not present

## 2014-11-04 DIAGNOSIS — N179 Acute kidney failure, unspecified: Secondary | ICD-10-CM | POA: Diagnosis not present

## 2014-11-04 DIAGNOSIS — N281 Cyst of kidney, acquired: Secondary | ICD-10-CM | POA: Diagnosis not present

## 2014-11-04 DIAGNOSIS — D7589 Other specified diseases of blood and blood-forming organs: Secondary | ICD-10-CM | POA: Diagnosis present

## 2014-11-04 DIAGNOSIS — H269 Unspecified cataract: Secondary | ICD-10-CM | POA: Diagnosis present

## 2014-11-04 DIAGNOSIS — Z8673 Personal history of transient ischemic attack (TIA), and cerebral infarction without residual deficits: Secondary | ICD-10-CM | POA: Diagnosis not present

## 2014-11-04 DIAGNOSIS — R739 Hyperglycemia, unspecified: Secondary | ICD-10-CM | POA: Diagnosis present

## 2014-11-04 DIAGNOSIS — E785 Hyperlipidemia, unspecified: Secondary | ICD-10-CM | POA: Diagnosis present

## 2014-11-04 LAB — CBC WITH DIFFERENTIAL/PLATELET
BANDS NEUTROPHIL: 1 %
Eosinophil: 1 %
HCT: 37.1 % — ABNORMAL LOW (ref 40.0–52.0)
HGB: 12.4 g/dL — AB (ref 13.0–18.0)
LYMPHS PCT: 7 %
MCH: 35.2 pg — ABNORMAL HIGH (ref 26.0–34.0)
MCHC: 33.6 g/dL (ref 32.0–36.0)
MCV: 105 fL — ABNORMAL HIGH (ref 80–100)
MONOS PCT: 8 %
MYELOCYTE: 2 %
Metamyelocyte: 1 %
PLATELETS: 278 10*3/uL (ref 150–440)
RBC: 3.54 10*6/uL — ABNORMAL LOW (ref 4.40–5.90)
RDW: 14 % (ref 11.5–14.5)
Segmented Neutrophils: 79 %
VARIANT LYMPHOCYTE - H1-RLYMPH: 1 %
WBC: 9.4 10*3/uL (ref 3.8–10.6)

## 2014-11-04 LAB — URINALYSIS, COMPLETE
BILIRUBIN, UR: NEGATIVE
Glucose,UR: NEGATIVE mg/dL (ref 0–75)
KETONE: NEGATIVE
NITRITE: NEGATIVE
PH: 5 (ref 4.5–8.0)
PROTEIN: NEGATIVE
SPECIFIC GRAVITY: 1.011 (ref 1.003–1.030)
Squamous Epithelial: 1

## 2014-11-04 LAB — COMPREHENSIVE METABOLIC PANEL
AST: 33 U/L (ref 15–37)
Albumin: 2.5 g/dL — ABNORMAL LOW (ref 3.4–5.0)
Alkaline Phosphatase: 500 U/L — ABNORMAL HIGH
Anion Gap: 14 (ref 7–16)
BUN: 109 mg/dL — AB (ref 7–18)
Bilirubin,Total: 1 mg/dL (ref 0.2–1.0)
CHLORIDE: 94 mmol/L — AB (ref 98–107)
CREATININE: 6.61 mg/dL — AB (ref 0.60–1.30)
Calcium, Total: 8.4 mg/dL — ABNORMAL LOW (ref 8.5–10.1)
Co2: 22 mmol/L (ref 21–32)
EGFR (Non-African Amer.): 9 — ABNORMAL LOW
GFR CALC AF AMER: 11 — AB
GLUCOSE: 204 mg/dL — AB (ref 65–99)
OSMOLALITY: 301 (ref 275–301)
POTASSIUM: 3.2 mmol/L — AB (ref 3.5–5.1)
SGPT (ALT): 54 U/L
SODIUM: 130 mmol/L — AB (ref 136–145)
Total Protein: 6.7 g/dL (ref 6.4–8.2)

## 2014-11-04 LAB — LIPASE, BLOOD: LIPASE: 428 U/L — AB (ref 73–393)

## 2014-11-04 LAB — PROTIME-INR
INR: 1.1
Prothrombin Time: 14.2 secs (ref 11.5–14.7)

## 2014-11-04 LAB — CREATININE, URINE, RANDOM: CREATININE, URINE RANDOM: 78.6 mg/dL (ref 30.0–125.0)

## 2014-11-04 LAB — SODIUM, URINE, RANDOM: SODIUM, URINE RANDOM: 32 mmol/L (ref 20–110)

## 2014-11-05 LAB — CBC WITH DIFFERENTIAL/PLATELET
BASOS PCT: 0.4 %
Basophil #: 0 10*3/uL (ref 0.0–0.1)
Eosinophil #: 0.1 10*3/uL (ref 0.0–0.7)
Eosinophil %: 0.5 %
HCT: 35.5 % — AB (ref 40.0–52.0)
HGB: 11.7 g/dL — AB (ref 13.0–18.0)
LYMPHS ABS: 0.5 10*3/uL — AB (ref 1.0–3.6)
LYMPHS PCT: 4.7 %
MCH: 34.7 pg — AB (ref 26.0–34.0)
MCHC: 32.9 g/dL (ref 32.0–36.0)
MCV: 105 fL — ABNORMAL HIGH (ref 80–100)
MONO ABS: 1 x10 3/mm (ref 0.2–1.0)
MONOS PCT: 9.2 %
Neutrophil #: 9.4 10*3/uL — ABNORMAL HIGH (ref 1.4–6.5)
Neutrophil %: 85.2 %
PLATELETS: 244 10*3/uL (ref 150–440)
RBC: 3.37 10*6/uL — ABNORMAL LOW (ref 4.40–5.90)
RDW: 14.2 % (ref 11.5–14.5)
WBC: 11 10*3/uL — ABNORMAL HIGH (ref 3.8–10.6)

## 2014-11-05 LAB — BASIC METABOLIC PANEL
ANION GAP: 10 (ref 7–16)
BUN: 93 mg/dL — AB (ref 7–18)
CALCIUM: 8 mg/dL — AB (ref 8.5–10.1)
CHLORIDE: 102 mmol/L (ref 98–107)
CO2: 24 mmol/L (ref 21–32)
CREATININE: 5.07 mg/dL — AB (ref 0.60–1.30)
EGFR (African American): 14 — ABNORMAL LOW
EGFR (Non-African Amer.): 12 — ABNORMAL LOW
Glucose: 91 mg/dL (ref 65–99)
OSMOLALITY: 300 (ref 275–301)
POTASSIUM: 3.4 mmol/L — AB (ref 3.5–5.1)
Sodium: 136 mmol/L (ref 136–145)

## 2014-11-05 LAB — LIPASE, BLOOD: Lipase: 126 U/L (ref 73–393)

## 2014-11-05 LAB — APTT: ACTIVATED PTT: 33.8 s (ref 23.6–35.9)

## 2014-11-05 LAB — MAGNESIUM: Magnesium: 1.7 mg/dL — ABNORMAL LOW

## 2014-11-06 LAB — HEPARIN LEVEL (UNFRACTIONATED)
Anti-Xa(Unfractionated): 0.57 IU/mL (ref 0.30–0.70)
Anti-Xa(Unfractionated): 0.68 IU/mL (ref 0.30–0.70)

## 2014-11-06 LAB — URINALYSIS, COMPLETE
Bacteria: NONE SEEN
Bilirubin,UR: NEGATIVE
Glucose,UR: 50 mg/dL (ref 0–75)
Ketone: NEGATIVE
Leukocyte Esterase: NEGATIVE
Nitrite: NEGATIVE
Ph: 5 (ref 4.5–8.0)
Protein: NEGATIVE
RBC,UR: 1 /HPF (ref 0–5)
Specific Gravity: 1.005 (ref 1.003–1.030)
Squamous Epithelial: <1
WBC UR: 1 /HPF (ref 0–5)

## 2014-11-06 LAB — CBC WITH DIFFERENTIAL/PLATELET
Bands: 1 %
Basophil: 1 %
Eosinophil: 1 %
HCT: 34.5 % — AB (ref 40.0–52.0)
HGB: 11.3 g/dL — AB (ref 13.0–18.0)
Lymphocytes: 8 %
MCH: 34.6 pg — ABNORMAL HIGH (ref 26.0–34.0)
MCHC: 32.9 g/dL (ref 32.0–36.0)
MCV: 105 fL — AB (ref 80–100)
MONOS PCT: 10 %
MYELOCYTE: 3 %
Metamyelocyte: 3 %
PLATELETS: 265 10*3/uL (ref 150–440)
RBC: 3.28 10*6/uL — ABNORMAL LOW (ref 4.40–5.90)
RDW: 14.2 % (ref 11.5–14.5)
SEGMENTED NEUTROPHILS: 73 %
WBC: 9.5 10*3/uL (ref 3.8–10.6)

## 2014-11-06 LAB — PROTIME-INR
INR: 1.1
Prothrombin Time: 13.9 secs (ref 11.5–14.7)

## 2014-11-06 LAB — BASIC METABOLIC PANEL
ANION GAP: 10 (ref 7–16)
BUN: 66 mg/dL — AB (ref 7–18)
CALCIUM: 7.8 mg/dL — AB (ref 8.5–10.1)
CO2: 24 mmol/L (ref 21–32)
Chloride: 100 mmol/L (ref 98–107)
Creatinine: 3.79 mg/dL — ABNORMAL HIGH (ref 0.60–1.30)
EGFR (African American): 20 — ABNORMAL LOW
EGFR (Non-African Amer.): 17 — ABNORMAL LOW
GLUCOSE: 93 mg/dL (ref 65–99)
OSMOLALITY: 287 (ref 275–301)
Potassium: 4.1 mmol/L (ref 3.5–5.1)
Sodium: 134 mmol/L — ABNORMAL LOW (ref 136–145)

## 2014-11-06 LAB — MAGNESIUM: MAGNESIUM: 1.7 mg/dL — AB

## 2014-11-06 LAB — URINE CULTURE

## 2014-11-07 LAB — CBC WITH DIFFERENTIAL/PLATELET
BANDS NEUTROPHIL: 0 %
COMMENT - H1-COM1: NORMAL
Comment - H1-Com2: NORMAL
EOS PCT: 2 %
HCT: 35.1 % — AB (ref 40.0–52.0)
HGB: 11.6 g/dL — ABNORMAL LOW (ref 13.0–18.0)
Lymphocytes: 12 %
MCH: 35 pg — ABNORMAL HIGH (ref 26.0–34.0)
MCHC: 33 g/dL (ref 32.0–36.0)
MCV: 106 fL — ABNORMAL HIGH (ref 80–100)
METAMYELOCYTE: 3 %
Monocytes: 9 %
Myelocyte: 1 %
Platelet: 280 10*3/uL (ref 150–440)
RBC: 3.31 10*6/uL — AB (ref 4.40–5.90)
RDW: 14.2 % (ref 11.5–14.5)
Segmented Neutrophils: 73 %
WBC: 7.7 10*3/uL (ref 3.8–10.6)

## 2014-11-07 LAB — BASIC METABOLIC PANEL
Anion Gap: 7 (ref 7–16)
BUN: 42 mg/dL — AB (ref 7–18)
CREATININE: 2.5 mg/dL — AB (ref 0.60–1.30)
Calcium, Total: 8.2 mg/dL — ABNORMAL LOW (ref 8.5–10.1)
Chloride: 107 mmol/L (ref 98–107)
Co2: 24 mmol/L (ref 21–32)
EGFR (Non-African Amer.): 27 — ABNORMAL LOW
GFR CALC AF AMER: 33 — AB
GLUCOSE: 98 mg/dL (ref 65–99)
Osmolality: 286 (ref 275–301)
Potassium: 3.7 mmol/L (ref 3.5–5.1)
Sodium: 138 mmol/L (ref 136–145)

## 2014-11-07 LAB — PROTIME-INR
INR: 1.1
PROTHROMBIN TIME: 13.6 s (ref 11.5–14.7)

## 2014-11-07 LAB — HEPARIN LEVEL (UNFRACTIONATED): Anti-Xa(Unfractionated): 0.37 IU/mL (ref 0.30–0.70)

## 2014-11-07 LAB — MAGNESIUM: Magnesium: 2 mg/dL

## 2014-11-11 DIAGNOSIS — N179 Acute kidney failure, unspecified: Secondary | ICD-10-CM | POA: Diagnosis not present

## 2014-11-11 DIAGNOSIS — Z952 Presence of prosthetic heart valve: Secondary | ICD-10-CM | POA: Diagnosis not present

## 2014-11-14 DIAGNOSIS — N183 Chronic kidney disease, stage 3 (moderate): Secondary | ICD-10-CM | POA: Diagnosis not present

## 2014-11-14 DIAGNOSIS — I4891 Unspecified atrial fibrillation: Secondary | ICD-10-CM | POA: Diagnosis not present

## 2014-11-15 DIAGNOSIS — N183 Chronic kidney disease, stage 3 (moderate): Secondary | ICD-10-CM | POA: Diagnosis not present

## 2014-11-15 DIAGNOSIS — D472 Monoclonal gammopathy: Secondary | ICD-10-CM | POA: Diagnosis not present

## 2014-11-16 ENCOUNTER — Inpatient Hospital Stay: Payer: Self-pay | Admitting: Internal Medicine

## 2014-11-16 DIAGNOSIS — R0602 Shortness of breath: Secondary | ICD-10-CM | POA: Diagnosis not present

## 2014-11-16 DIAGNOSIS — Z4502 Encounter for adjustment and management of automatic implantable cardiac defibrillator: Secondary | ICD-10-CM | POA: Diagnosis not present

## 2014-11-16 DIAGNOSIS — I4891 Unspecified atrial fibrillation: Secondary | ICD-10-CM | POA: Diagnosis not present

## 2014-11-16 DIAGNOSIS — I472 Ventricular tachycardia: Secondary | ICD-10-CM | POA: Diagnosis not present

## 2014-11-16 DIAGNOSIS — R0600 Dyspnea, unspecified: Secondary | ICD-10-CM | POA: Diagnosis not present

## 2014-11-16 DIAGNOSIS — I214 Non-ST elevation (NSTEMI) myocardial infarction: Secondary | ICD-10-CM | POA: Diagnosis not present

## 2014-11-16 DIAGNOSIS — I482 Chronic atrial fibrillation: Secondary | ICD-10-CM | POA: Diagnosis not present

## 2014-11-16 DIAGNOSIS — Z9889 Other specified postprocedural states: Secondary | ICD-10-CM | POA: Diagnosis not present

## 2014-11-16 DIAGNOSIS — I10 Essential (primary) hypertension: Secondary | ICD-10-CM | POA: Diagnosis not present

## 2014-11-16 DIAGNOSIS — R079 Chest pain, unspecified: Secondary | ICD-10-CM | POA: Diagnosis not present

## 2014-11-16 LAB — CK-MB
CK-MB: 110.4 ng/mL — AB (ref 0.5–3.6)
CK-MB: 6.8 ng/mL — ABNORMAL HIGH (ref 0.5–3.6)
CK-MB: 69.8 ng/mL — ABNORMAL HIGH (ref 0.5–3.6)

## 2014-11-16 LAB — COMPREHENSIVE METABOLIC PANEL
ALBUMIN: 3.1 g/dL — AB (ref 3.4–5.0)
Alkaline Phosphatase: 614 U/L — ABNORMAL HIGH
Anion Gap: 7 (ref 7–16)
BUN: 14 mg/dL (ref 7–18)
Bilirubin,Total: 0.6 mg/dL (ref 0.2–1.0)
CO2: 27 mmol/L (ref 21–32)
CREATININE: 1.46 mg/dL — AB (ref 0.60–1.30)
Calcium, Total: 8.4 mg/dL — ABNORMAL LOW (ref 8.5–10.1)
Chloride: 106 mmol/L (ref 98–107)
EGFR (African American): >60
EGFR (Non-African Amer.): 50 — ABNORMAL LOW
GLUCOSE: 131 mg/dL — AB (ref 65–99)
Osmolality: 282 (ref 275–301)
Potassium: 4.1 mmol/L (ref 3.5–5.1)
SGOT(AST): 68 U/L — ABNORMAL HIGH (ref 15–37)
SGPT (ALT): 102 U/L — ABNORMAL HIGH
Sodium: 140 mmol/L (ref 136–145)
Total Protein: 7.2 g/dL (ref 6.4–8.2)

## 2014-11-16 LAB — PRO B NATRIURETIC PEPTIDE: B-Type Natriuretic Peptide: 4036 pg/mL — ABNORMAL HIGH (ref 0–125)

## 2014-11-16 LAB — TROPONIN I
Troponin-I: 0.92 ng/mL — ABNORMAL HIGH
Troponin-I: 16 ng/mL — ABNORMAL HIGH
Troponin-I: 29 ng/mL — ABNORMAL HIGH

## 2014-11-16 LAB — CBC
HCT: 35.9 % — ABNORMAL LOW (ref 40.0–52.0)
HGB: 11.8 g/dL — AB (ref 13.0–18.0)
MCH: 34.6 pg — ABNORMAL HIGH (ref 26.0–34.0)
MCHC: 32.9 g/dL (ref 32.0–36.0)
MCV: 105 fL — AB (ref 80–100)
Platelet: 274 10*3/uL (ref 150–440)
RBC: 3.41 10*6/uL — ABNORMAL LOW (ref 4.40–5.90)
RDW: 14.3 % (ref 11.5–14.5)
WBC: 10.1 10*3/uL (ref 3.8–10.6)

## 2014-11-16 LAB — PROTIME-INR
INR: 1.4
Prothrombin Time: 16.6 secs — ABNORMAL HIGH (ref 11.5–14.7)

## 2014-11-16 LAB — MAGNESIUM: MAGNESIUM: 1.4 mg/dL — AB

## 2014-11-17 DIAGNOSIS — I214 Non-ST elevation (NSTEMI) myocardial infarction: Secondary | ICD-10-CM | POA: Diagnosis not present

## 2014-11-17 DIAGNOSIS — I25110 Atherosclerotic heart disease of native coronary artery with unstable angina pectoris: Secondary | ICD-10-CM | POA: Diagnosis not present

## 2014-11-17 DIAGNOSIS — I2541 Coronary artery aneurysm: Secondary | ICD-10-CM | POA: Diagnosis not present

## 2014-11-17 DIAGNOSIS — I482 Chronic atrial fibrillation: Secondary | ICD-10-CM | POA: Diagnosis not present

## 2014-11-17 DIAGNOSIS — I209 Angina pectoris, unspecified: Secondary | ICD-10-CM | POA: Diagnosis not present

## 2014-11-17 DIAGNOSIS — I10 Essential (primary) hypertension: Secondary | ICD-10-CM | POA: Diagnosis not present

## 2014-11-17 LAB — LIPID PANEL
Cholesterol: 145 mg/dL (ref 0–200)
HDL Cholesterol: 33 mg/dL — ABNORMAL LOW (ref 40–60)
LDL CHOLESTEROL, CALC: 88 mg/dL (ref 0–100)
Triglycerides: 119 mg/dL (ref 0–200)
VLDL CHOLESTEROL, CALC: 24 mg/dL (ref 5–40)

## 2014-11-17 LAB — PROTIME-INR
INR: 1.5
Prothrombin Time: 18.2 secs — ABNORMAL HIGH (ref 11.5–14.7)

## 2014-11-17 LAB — MAGNESIUM: Magnesium: 1.8 mg/dL

## 2014-11-18 DIAGNOSIS — I472 Ventricular tachycardia: Secondary | ICD-10-CM | POA: Diagnosis not present

## 2014-11-18 DIAGNOSIS — I482 Chronic atrial fibrillation: Secondary | ICD-10-CM | POA: Diagnosis not present

## 2014-11-18 DIAGNOSIS — I10 Essential (primary) hypertension: Secondary | ICD-10-CM | POA: Diagnosis not present

## 2014-11-18 DIAGNOSIS — I214 Non-ST elevation (NSTEMI) myocardial infarction: Secondary | ICD-10-CM | POA: Diagnosis not present

## 2014-11-18 DIAGNOSIS — I25110 Atherosclerotic heart disease of native coronary artery with unstable angina pectoris: Secondary | ICD-10-CM | POA: Diagnosis not present

## 2014-11-18 LAB — CBC WITH DIFFERENTIAL/PLATELET
BASOS PCT: 1.2 %
Basophil #: 0.1 10*3/uL (ref 0.0–0.1)
EOS PCT: 1.2 %
Eosinophil #: 0.1 10*3/uL (ref 0.0–0.7)
HCT: 36.3 % — ABNORMAL LOW (ref 40.0–52.0)
HGB: 11.6 g/dL — AB (ref 13.0–18.0)
LYMPHS ABS: 1.3 10*3/uL (ref 1.0–3.6)
Lymphocyte %: 18.2 %
MCH: 34 pg (ref 26.0–34.0)
MCHC: 32 g/dL (ref 32.0–36.0)
MCV: 107 fL — AB (ref 80–100)
Monocyte #: 1.1 x10 3/mm — ABNORMAL HIGH (ref 0.2–1.0)
Monocyte %: 15 %
NEUTROS ABS: 4.7 10*3/uL (ref 1.4–6.5)
Neutrophil %: 64.4 %
Platelet: 237 10*3/uL (ref 150–440)
RBC: 3.41 10*6/uL — AB (ref 4.40–5.90)
RDW: 13.9 % (ref 11.5–14.5)
WBC: 7.3 10*3/uL (ref 3.8–10.6)

## 2014-11-18 LAB — PROTIME-INR
INR: 1.4
INR: 1.2
PROTHROMBIN TIME: 14.9 s — AB (ref 11.5–14.7)
Prothrombin Time: 16.7 secs — ABNORMAL HIGH (ref 11.5–14.7)

## 2014-11-18 LAB — BASIC METABOLIC PANEL
Anion Gap: 7 (ref 7–16)
BUN: 17 mg/dL (ref 7–18)
CHLORIDE: 106 mmol/L (ref 98–107)
CREATININE: 1.35 mg/dL — AB (ref 0.60–1.30)
Calcium, Total: 8.5 mg/dL (ref 8.5–10.1)
Co2: 27 mmol/L (ref 21–32)
EGFR (African American): >60
EGFR (Non-African Amer.): 55 — ABNORMAL LOW
Glucose: 93 mg/dL (ref 65–99)
OSMOLALITY: 281 (ref 275–301)
POTASSIUM: 4.2 mmol/L (ref 3.5–5.1)
SODIUM: 140 mmol/L (ref 136–145)

## 2014-11-18 LAB — APTT: Activated PTT: 42.6 secs — ABNORMAL HIGH (ref 23.6–35.9)

## 2014-11-19 DIAGNOSIS — I25110 Atherosclerotic heart disease of native coronary artery with unstable angina pectoris: Secondary | ICD-10-CM | POA: Diagnosis not present

## 2014-11-19 DIAGNOSIS — I472 Ventricular tachycardia: Secondary | ICD-10-CM | POA: Diagnosis not present

## 2014-11-19 DIAGNOSIS — I10 Essential (primary) hypertension: Secondary | ICD-10-CM | POA: Diagnosis not present

## 2014-11-19 DIAGNOSIS — I214 Non-ST elevation (NSTEMI) myocardial infarction: Secondary | ICD-10-CM | POA: Diagnosis not present

## 2014-11-19 DIAGNOSIS — I482 Chronic atrial fibrillation: Secondary | ICD-10-CM | POA: Diagnosis not present

## 2014-11-19 LAB — CBC WITH DIFFERENTIAL/PLATELET
Basophil #: 0.2 10*3/uL — ABNORMAL HIGH (ref 0.0–0.1)
Basophil %: 2.4 %
EOS ABS: 0.2 10*3/uL (ref 0.0–0.7)
Eosinophil %: 2.6 %
HCT: 32.6 % — ABNORMAL LOW (ref 40.0–52.0)
HGB: 10.6 g/dL — ABNORMAL LOW (ref 13.0–18.0)
Lymphocyte #: 1 10*3/uL (ref 1.0–3.6)
Lymphocyte %: 13.7 %
MCH: 35 pg — ABNORMAL HIGH (ref 26.0–34.0)
MCHC: 32.6 g/dL (ref 32.0–36.0)
MCV: 107 fL — AB (ref 80–100)
MONO ABS: 1 x10 3/mm (ref 0.2–1.0)
MONOS PCT: 13.8 %
Neutrophil #: 4.8 10*3/uL (ref 1.4–6.5)
Neutrophil %: 67.5 %
Platelet: 217 10*3/uL (ref 150–440)
RBC: 3.04 10*6/uL — ABNORMAL LOW (ref 4.40–5.90)
RDW: 14 % (ref 11.5–14.5)
WBC: 7.1 10*3/uL (ref 3.8–10.6)

## 2014-11-19 LAB — HEPARIN LEVEL (UNFRACTIONATED)
ANTI-XA(UNFRACTIONATED): 0.52 [IU]/mL (ref 0.30–0.70)
Anti-Xa(Unfractionated): 0.78 IU/mL — ABNORMAL HIGH (ref 0.30–0.70)

## 2014-11-20 DIAGNOSIS — I482 Chronic atrial fibrillation: Secondary | ICD-10-CM | POA: Diagnosis not present

## 2014-11-20 DIAGNOSIS — I214 Non-ST elevation (NSTEMI) myocardial infarction: Secondary | ICD-10-CM | POA: Diagnosis not present

## 2014-11-20 DIAGNOSIS — I10 Essential (primary) hypertension: Secondary | ICD-10-CM | POA: Diagnosis not present

## 2014-11-20 LAB — CBC WITH DIFFERENTIAL/PLATELET
BASOS PCT: 1.7 %
Basophil #: 0.1 10*3/uL (ref 0.0–0.1)
EOS ABS: 0.2 10*3/uL (ref 0.0–0.7)
Eosinophil %: 2.7 %
HCT: 31.3 % — AB (ref 40.0–52.0)
HGB: 10.2 g/dL — ABNORMAL LOW (ref 13.0–18.0)
Lymphocyte #: 1 10*3/uL (ref 1.0–3.6)
Lymphocyte %: 14.2 %
MCH: 34.8 pg — ABNORMAL HIGH (ref 26.0–34.0)
MCHC: 32.7 g/dL (ref 32.0–36.0)
MCV: 106 fL — ABNORMAL HIGH (ref 80–100)
MONO ABS: 1 x10 3/mm (ref 0.2–1.0)
Monocyte %: 14.2 %
Neutrophil #: 4.7 10*3/uL (ref 1.4–6.5)
Neutrophil %: 67.2 %
Platelet: 219 10*3/uL (ref 150–440)
RBC: 2.95 10*6/uL — ABNORMAL LOW (ref 4.40–5.90)
RDW: 13.8 % (ref 11.5–14.5)
WBC: 6.9 10*3/uL (ref 3.8–10.6)

## 2014-11-20 LAB — PROTIME-INR
INR: 1.2
Prothrombin Time: 15.2 secs — ABNORMAL HIGH (ref 11.5–14.7)

## 2014-11-20 LAB — HEPARIN LEVEL (UNFRACTIONATED): ANTI-XA(UNFRACTIONATED): 0.41 [IU]/mL (ref 0.30–0.70)

## 2014-11-25 DIAGNOSIS — Z952 Presence of prosthetic heart valve: Secondary | ICD-10-CM | POA: Diagnosis not present

## 2014-11-28 DIAGNOSIS — I2510 Atherosclerotic heart disease of native coronary artery without angina pectoris: Secondary | ICD-10-CM | POA: Diagnosis not present

## 2014-11-28 DIAGNOSIS — I5032 Chronic diastolic (congestive) heart failure: Secondary | ICD-10-CM | POA: Diagnosis not present

## 2014-11-28 DIAGNOSIS — Z952 Presence of prosthetic heart valve: Secondary | ICD-10-CM | POA: Diagnosis not present

## 2014-11-28 DIAGNOSIS — I10 Essential (primary) hypertension: Secondary | ICD-10-CM | POA: Diagnosis not present

## 2014-12-04 DIAGNOSIS — N183 Chronic kidney disease, stage 3 (moderate): Secondary | ICD-10-CM | POA: Diagnosis not present

## 2014-12-04 DIAGNOSIS — R609 Edema, unspecified: Secondary | ICD-10-CM | POA: Diagnosis not present

## 2014-12-04 DIAGNOSIS — Z7901 Long term (current) use of anticoagulants: Secondary | ICD-10-CM | POA: Diagnosis not present

## 2014-12-04 DIAGNOSIS — I10 Essential (primary) hypertension: Secondary | ICD-10-CM | POA: Diagnosis not present

## 2014-12-04 DIAGNOSIS — R600 Localized edema: Secondary | ICD-10-CM | POA: Diagnosis not present

## 2014-12-04 DIAGNOSIS — Z952 Presence of prosthetic heart valve: Secondary | ICD-10-CM | POA: Diagnosis not present

## 2014-12-04 DIAGNOSIS — J449 Chronic obstructive pulmonary disease, unspecified: Secondary | ICD-10-CM | POA: Diagnosis not present

## 2014-12-05 DIAGNOSIS — I481 Persistent atrial fibrillation: Secondary | ICD-10-CM | POA: Diagnosis not present

## 2014-12-09 DIAGNOSIS — Z952 Presence of prosthetic heart valve: Secondary | ICD-10-CM | POA: Diagnosis not present

## 2014-12-09 DIAGNOSIS — I4891 Unspecified atrial fibrillation: Secondary | ICD-10-CM | POA: Diagnosis not present

## 2014-12-16 DIAGNOSIS — Z952 Presence of prosthetic heart valve: Secondary | ICD-10-CM | POA: Diagnosis not present

## 2014-12-16 DIAGNOSIS — Z953 Presence of xenogenic heart valve: Secondary | ICD-10-CM | POA: Diagnosis not present

## 2014-12-23 DIAGNOSIS — Z953 Presence of xenogenic heart valve: Secondary | ICD-10-CM | POA: Diagnosis not present

## 2014-12-23 DIAGNOSIS — Z952 Presence of prosthetic heart valve: Secondary | ICD-10-CM | POA: Diagnosis not present

## 2014-12-31 ENCOUNTER — Encounter: Payer: Self-pay | Admitting: Cardiology

## 2014-12-31 DIAGNOSIS — Z95810 Presence of automatic (implantable) cardiac defibrillator: Secondary | ICD-10-CM | POA: Diagnosis not present

## 2014-12-31 DIAGNOSIS — I214 Non-ST elevation (NSTEMI) myocardial infarction: Secondary | ICD-10-CM | POA: Diagnosis not present

## 2014-12-31 DIAGNOSIS — J449 Chronic obstructive pulmonary disease, unspecified: Secondary | ICD-10-CM | POA: Diagnosis not present

## 2015-01-01 DIAGNOSIS — J449 Chronic obstructive pulmonary disease, unspecified: Secondary | ICD-10-CM | POA: Diagnosis not present

## 2015-01-01 DIAGNOSIS — I214 Non-ST elevation (NSTEMI) myocardial infarction: Secondary | ICD-10-CM | POA: Diagnosis not present

## 2015-01-01 DIAGNOSIS — Z95810 Presence of automatic (implantable) cardiac defibrillator: Secondary | ICD-10-CM | POA: Diagnosis not present

## 2015-01-02 DIAGNOSIS — Z95810 Presence of automatic (implantable) cardiac defibrillator: Secondary | ICD-10-CM | POA: Diagnosis not present

## 2015-01-02 DIAGNOSIS — I214 Non-ST elevation (NSTEMI) myocardial infarction: Secondary | ICD-10-CM | POA: Diagnosis not present

## 2015-01-02 DIAGNOSIS — J449 Chronic obstructive pulmonary disease, unspecified: Secondary | ICD-10-CM | POA: Diagnosis not present

## 2015-01-06 DIAGNOSIS — I214 Non-ST elevation (NSTEMI) myocardial infarction: Secondary | ICD-10-CM | POA: Diagnosis not present

## 2015-01-06 DIAGNOSIS — Z95810 Presence of automatic (implantable) cardiac defibrillator: Secondary | ICD-10-CM | POA: Diagnosis not present

## 2015-01-06 DIAGNOSIS — J449 Chronic obstructive pulmonary disease, unspecified: Secondary | ICD-10-CM | POA: Diagnosis not present

## 2015-01-06 DIAGNOSIS — Z953 Presence of xenogenic heart valve: Secondary | ICD-10-CM | POA: Diagnosis not present

## 2015-01-08 DIAGNOSIS — I214 Non-ST elevation (NSTEMI) myocardial infarction: Secondary | ICD-10-CM | POA: Diagnosis not present

## 2015-01-08 DIAGNOSIS — J449 Chronic obstructive pulmonary disease, unspecified: Secondary | ICD-10-CM | POA: Diagnosis not present

## 2015-01-08 DIAGNOSIS — Z95810 Presence of automatic (implantable) cardiac defibrillator: Secondary | ICD-10-CM | POA: Diagnosis not present

## 2015-01-09 DIAGNOSIS — Z95810 Presence of automatic (implantable) cardiac defibrillator: Secondary | ICD-10-CM | POA: Diagnosis not present

## 2015-01-09 DIAGNOSIS — I214 Non-ST elevation (NSTEMI) myocardial infarction: Secondary | ICD-10-CM | POA: Diagnosis not present

## 2015-01-09 DIAGNOSIS — J449 Chronic obstructive pulmonary disease, unspecified: Secondary | ICD-10-CM | POA: Diagnosis not present

## 2015-01-15 DIAGNOSIS — J449 Chronic obstructive pulmonary disease, unspecified: Secondary | ICD-10-CM | POA: Diagnosis not present

## 2015-01-15 DIAGNOSIS — I214 Non-ST elevation (NSTEMI) myocardial infarction: Secondary | ICD-10-CM | POA: Diagnosis not present

## 2015-01-15 DIAGNOSIS — Z95810 Presence of automatic (implantable) cardiac defibrillator: Secondary | ICD-10-CM | POA: Diagnosis not present

## 2015-01-16 DIAGNOSIS — I214 Non-ST elevation (NSTEMI) myocardial infarction: Secondary | ICD-10-CM | POA: Diagnosis not present

## 2015-01-16 DIAGNOSIS — Z95810 Presence of automatic (implantable) cardiac defibrillator: Secondary | ICD-10-CM | POA: Diagnosis not present

## 2015-01-16 DIAGNOSIS — J449 Chronic obstructive pulmonary disease, unspecified: Secondary | ICD-10-CM | POA: Diagnosis not present

## 2015-01-20 DIAGNOSIS — I214 Non-ST elevation (NSTEMI) myocardial infarction: Secondary | ICD-10-CM | POA: Diagnosis not present

## 2015-01-20 DIAGNOSIS — Z95810 Presence of automatic (implantable) cardiac defibrillator: Secondary | ICD-10-CM | POA: Diagnosis not present

## 2015-01-20 DIAGNOSIS — J449 Chronic obstructive pulmonary disease, unspecified: Secondary | ICD-10-CM | POA: Diagnosis not present

## 2015-01-22 DIAGNOSIS — Z95810 Presence of automatic (implantable) cardiac defibrillator: Secondary | ICD-10-CM | POA: Diagnosis not present

## 2015-01-22 DIAGNOSIS — I214 Non-ST elevation (NSTEMI) myocardial infarction: Secondary | ICD-10-CM | POA: Diagnosis not present

## 2015-01-22 DIAGNOSIS — J449 Chronic obstructive pulmonary disease, unspecified: Secondary | ICD-10-CM | POA: Diagnosis not present

## 2015-01-23 DIAGNOSIS — J449 Chronic obstructive pulmonary disease, unspecified: Secondary | ICD-10-CM | POA: Diagnosis not present

## 2015-01-23 DIAGNOSIS — Z95810 Presence of automatic (implantable) cardiac defibrillator: Secondary | ICD-10-CM | POA: Diagnosis not present

## 2015-01-23 DIAGNOSIS — I214 Non-ST elevation (NSTEMI) myocardial infarction: Secondary | ICD-10-CM | POA: Diagnosis not present

## 2015-01-27 DIAGNOSIS — Z95810 Presence of automatic (implantable) cardiac defibrillator: Secondary | ICD-10-CM | POA: Diagnosis not present

## 2015-01-27 DIAGNOSIS — I214 Non-ST elevation (NSTEMI) myocardial infarction: Secondary | ICD-10-CM | POA: Diagnosis not present

## 2015-01-27 DIAGNOSIS — J449 Chronic obstructive pulmonary disease, unspecified: Secondary | ICD-10-CM | POA: Diagnosis not present

## 2015-01-28 ENCOUNTER — Encounter
Admit: 2015-01-28 | Disposition: A | Discharge status: Home / Self Care | Payer: Self-pay | Attending: Cardiology | Admitting: Cardiology

## 2015-01-28 DIAGNOSIS — I214 Non-ST elevation (NSTEMI) myocardial infarction: Secondary | ICD-10-CM | POA: Diagnosis not present

## 2015-01-28 DIAGNOSIS — I4891 Unspecified atrial fibrillation: Secondary | ICD-10-CM | POA: Diagnosis not present

## 2015-01-29 DIAGNOSIS — I214 Non-ST elevation (NSTEMI) myocardial infarction: Secondary | ICD-10-CM | POA: Diagnosis not present

## 2015-01-29 DIAGNOSIS — I4891 Unspecified atrial fibrillation: Secondary | ICD-10-CM | POA: Diagnosis not present

## 2015-01-30 DIAGNOSIS — I214 Non-ST elevation (NSTEMI) myocardial infarction: Secondary | ICD-10-CM | POA: Diagnosis not present

## 2015-01-30 DIAGNOSIS — I4891 Unspecified atrial fibrillation: Secondary | ICD-10-CM | POA: Diagnosis not present

## 2015-02-03 DIAGNOSIS — I214 Non-ST elevation (NSTEMI) myocardial infarction: Secondary | ICD-10-CM | POA: Diagnosis not present

## 2015-02-03 DIAGNOSIS — I4891 Unspecified atrial fibrillation: Secondary | ICD-10-CM | POA: Diagnosis not present

## 2015-02-05 DIAGNOSIS — Z952 Presence of prosthetic heart valve: Secondary | ICD-10-CM | POA: Diagnosis not present

## 2015-02-05 DIAGNOSIS — D472 Monoclonal gammopathy: Secondary | ICD-10-CM | POA: Diagnosis not present

## 2015-02-05 DIAGNOSIS — I214 Non-ST elevation (NSTEMI) myocardial infarction: Secondary | ICD-10-CM | POA: Diagnosis not present

## 2015-02-05 DIAGNOSIS — I10 Essential (primary) hypertension: Secondary | ICD-10-CM | POA: Diagnosis not present

## 2015-02-05 DIAGNOSIS — R600 Localized edema: Secondary | ICD-10-CM | POA: Diagnosis not present

## 2015-02-05 DIAGNOSIS — I4891 Unspecified atrial fibrillation: Secondary | ICD-10-CM | POA: Diagnosis not present

## 2015-02-05 DIAGNOSIS — N183 Chronic kidney disease, stage 3 (moderate): Secondary | ICD-10-CM | POA: Diagnosis not present

## 2015-02-06 DIAGNOSIS — I214 Non-ST elevation (NSTEMI) myocardial infarction: Secondary | ICD-10-CM | POA: Diagnosis not present

## 2015-02-06 DIAGNOSIS — I4891 Unspecified atrial fibrillation: Secondary | ICD-10-CM | POA: Diagnosis not present

## 2015-02-10 DIAGNOSIS — I4891 Unspecified atrial fibrillation: Secondary | ICD-10-CM | POA: Diagnosis not present

## 2015-02-10 DIAGNOSIS — I214 Non-ST elevation (NSTEMI) myocardial infarction: Secondary | ICD-10-CM | POA: Diagnosis not present

## 2015-02-12 DIAGNOSIS — I255 Ischemic cardiomyopathy: Secondary | ICD-10-CM | POA: Diagnosis not present

## 2015-02-12 DIAGNOSIS — I10 Essential (primary) hypertension: Secondary | ICD-10-CM | POA: Diagnosis not present

## 2015-02-12 DIAGNOSIS — I214 Non-ST elevation (NSTEMI) myocardial infarction: Secondary | ICD-10-CM | POA: Diagnosis not present

## 2015-02-12 DIAGNOSIS — Z791 Long term (current) use of non-steroidal anti-inflammatories (NSAID): Secondary | ICD-10-CM | POA: Diagnosis not present

## 2015-02-12 DIAGNOSIS — I2510 Atherosclerotic heart disease of native coronary artery without angina pectoris: Secondary | ICD-10-CM | POA: Diagnosis not present

## 2015-02-12 DIAGNOSIS — I5032 Chronic diastolic (congestive) heart failure: Secondary | ICD-10-CM | POA: Diagnosis not present

## 2015-02-12 DIAGNOSIS — Z952 Presence of prosthetic heart valve: Secondary | ICD-10-CM | POA: Diagnosis not present

## 2015-02-12 DIAGNOSIS — I4891 Unspecified atrial fibrillation: Secondary | ICD-10-CM | POA: Diagnosis not present

## 2015-02-13 DIAGNOSIS — I214 Non-ST elevation (NSTEMI) myocardial infarction: Secondary | ICD-10-CM | POA: Diagnosis not present

## 2015-02-13 DIAGNOSIS — I4891 Unspecified atrial fibrillation: Secondary | ICD-10-CM | POA: Diagnosis not present

## 2015-02-14 ENCOUNTER — Emergency Department: Payer: Self-pay | Admitting: Emergency Medicine

## 2015-02-14 DIAGNOSIS — N179 Acute kidney failure, unspecified: Secondary | ICD-10-CM | POA: Diagnosis not present

## 2015-02-14 DIAGNOSIS — N189 Chronic kidney disease, unspecified: Secondary | ICD-10-CM | POA: Diagnosis not present

## 2015-02-14 DIAGNOSIS — R51 Headache: Secondary | ICD-10-CM | POA: Diagnosis not present

## 2015-02-14 DIAGNOSIS — R42 Dizziness and giddiness: Secondary | ICD-10-CM | POA: Diagnosis not present

## 2015-02-14 DIAGNOSIS — I129 Hypertensive chronic kidney disease with stage 1 through stage 4 chronic kidney disease, or unspecified chronic kidney disease: Secondary | ICD-10-CM | POA: Diagnosis not present

## 2015-02-14 DIAGNOSIS — Z952 Presence of prosthetic heart valve: Secondary | ICD-10-CM | POA: Diagnosis not present

## 2015-02-14 DIAGNOSIS — R791 Abnormal coagulation profile: Secondary | ICD-10-CM | POA: Diagnosis not present

## 2015-02-14 DIAGNOSIS — Z87891 Personal history of nicotine dependence: Secondary | ICD-10-CM | POA: Diagnosis not present

## 2015-02-17 DIAGNOSIS — I214 Non-ST elevation (NSTEMI) myocardial infarction: Secondary | ICD-10-CM | POA: Diagnosis not present

## 2015-02-17 DIAGNOSIS — Z952 Presence of prosthetic heart valve: Secondary | ICD-10-CM | POA: Diagnosis not present

## 2015-02-17 DIAGNOSIS — I4891 Unspecified atrial fibrillation: Secondary | ICD-10-CM | POA: Diagnosis not present

## 2015-02-18 DIAGNOSIS — M65332 Trigger finger, left middle finger: Secondary | ICD-10-CM | POA: Diagnosis not present

## 2015-02-18 DIAGNOSIS — Z9889 Other specified postprocedural states: Secondary | ICD-10-CM | POA: Diagnosis not present

## 2015-02-19 DIAGNOSIS — I214 Non-ST elevation (NSTEMI) myocardial infarction: Secondary | ICD-10-CM | POA: Diagnosis not present

## 2015-02-19 DIAGNOSIS — I4891 Unspecified atrial fibrillation: Secondary | ICD-10-CM | POA: Diagnosis not present

## 2015-02-20 DIAGNOSIS — Z952 Presence of prosthetic heart valve: Secondary | ICD-10-CM | POA: Diagnosis not present

## 2015-02-20 DIAGNOSIS — I4891 Unspecified atrial fibrillation: Secondary | ICD-10-CM | POA: Diagnosis not present

## 2015-02-20 DIAGNOSIS — I214 Non-ST elevation (NSTEMI) myocardial infarction: Secondary | ICD-10-CM | POA: Diagnosis not present

## 2015-02-24 DIAGNOSIS — Z952 Presence of prosthetic heart valve: Secondary | ICD-10-CM | POA: Diagnosis not present

## 2015-02-24 DIAGNOSIS — I214 Non-ST elevation (NSTEMI) myocardial infarction: Secondary | ICD-10-CM | POA: Diagnosis not present

## 2015-02-24 DIAGNOSIS — I4891 Unspecified atrial fibrillation: Secondary | ICD-10-CM | POA: Diagnosis not present

## 2015-02-28 ENCOUNTER — Encounter
Admit: 2015-02-28 | Disposition: A | Discharge status: Home / Self Care | Payer: Self-pay | Attending: Cardiology | Admitting: Cardiology

## 2015-02-28 DIAGNOSIS — Z953 Presence of xenogenic heart valve: Secondary | ICD-10-CM | POA: Diagnosis not present

## 2015-02-28 DIAGNOSIS — Z952 Presence of prosthetic heart valve: Secondary | ICD-10-CM | POA: Diagnosis not present

## 2015-03-10 DIAGNOSIS — Z953 Presence of xenogenic heart valve: Secondary | ICD-10-CM | POA: Diagnosis not present

## 2015-03-22 NOTE — Op Note (Signed)
PATIENT NAME:  Kirk Jones, Kirk Jones MR#:  [id number] DATE OF BIRTH:  02-Feb-1941  DATE OF PROCEDURE:  11/04/2014  PREOPERATIVE DIAGNOSIS: Right carpal tunnel syndrome.   POSTOPERATIVE DIAGNOSIS: Right carpal tunnel syndrome.   PROCEDURE PERFORMED: Right carpal tunnel release.   SURGEON: Laurice Record. Holley Bouche., MD   ANESTHESIA: General.   ESTIMATED BLOOD LOSS: Minimal.   FLUIDS REPLACED: 500 mL of crystalloid.   TOURNIQUET TIME: 24 minutes.   DRAINS: None.   INDICATIONS FOR SURGERY: The patient is a 74 year old male, who has been seen for complaints of numbness and paresthesias in both hands with the right more symptomatic than the left. EMG nerve conduction studies demonstrated findings consistent with severe carpal tunnel syndrome to the right and moderately severe carpal tunnel syndrome to the left. After discussion of the risks and benefits of surgical intervention, the patient expressed understanding of the risks, benefits, and agreed with plans for surgical intervention.   PROCEDURE IN DETAIL: The patient was brought to the operating room and after adequate general anesthesia was achieved, a tourniquet was placed on the patient's upper right arm. The patient's right hand and arm were cleaned and prepped with alcohol and DuraPrep, draped in the usual sterile fashion. A "timeout" was performed as per usual protocol. Loupe magnification was used throughout the procedure. The right upper extremity was exsanguinated using an Esmarch, and the tourniquet was inflated to 250 mmHg. A curvilinear incision was made just ulnar to the thenar palmar crease. Dissection was carried down through the palmar fascia to the transverse carpal ligament. The transverse carpal ligament was sharply incised, taking care to protect the underlying structures within the carpal tunnel. Complete release of the transverse carpal ligament was achieved. The median nerve initially had a fusiform appearance consistent with a  compressive neuropathy, No evidence of lipoma or ganglion cyst within the tunnel. The wound was irrigated with copious amounts of normal saline with antibiotic solution. The skin was reapproximated using interrupted sutures of 5-0 nylon. Then, 10 mL of 0.25% Marcaine was injected along the incision site. A sterile dressing was applied followed by application of a volar splint. Tourniquet was deflated after total tourniquet time of 24 minutes.   The patient tolerated the procedure well. He was transported to the recovery room in stable condition.    ____________________________ Laurice Record. Holley Bouche., MD jph:ap D: 11/04/2014 12:39:14 ET T: 11/04/2014 12:58:16 ET JOB#: [id number]  cc: Jeneen Rinks P. Holley Bouche., MD, <Dictator> Laurice Record Holley Bouche MD ELECTRONICALLY SIGNED 11/10/2014 21:53

## 2015-03-22 NOTE — Discharge Summary (Signed)
PATIENT NAME:  Kirk Jones, Kirk Jones MR#:  [id number] DATE OF BIRTH:  1941/09/20  DATE OF ADMISSION:  11/04/2014 DATE OF DISCHARGE:  11/07/2014  PRIMARY CARE PHYSICIAN: Dr. Jeananne Rama  DISCHARGE DIAGNOSES:  1.  Acute renal failure/chronic kidney disease.  2.  Acute pancreatitis.  3.  Alcohol abuse.  4.  Atrial fibrillation.  5.  Coronary artery disease.  6.  Monoclonal gammopathies of undetermined significance.  CONDITION: Stable.   CODE STATUS: Full code.   HOME MEDICATIONS: Please refer to the medication reconciliation list.   DIET: Low-sodium, low-fat, low-cholesterol diet.   ACTIVITY: As tolerated.   FOLLOW-UP CARE: Follow up with PCP within 1 to 2 days and follow up with Dr. Juleen China  within 1 to 2 weeks. The patient needs to follow up BMP with Dr. Juleen China or PCP. Patient's INR is subtherapeutic. He needs to continue Coumadin and his home medication Lovenox.   REASON FOR ADMISSION: Abnormal laboratories, sent by primary care physician.   HOSPITAL COURSE: The patient is a 74 years old Caucasian male with a history of atrial fibrillation on Coumadin, hypertension, CAD. Was sent by primary care physician due to elevated creatinine of 6. The patient had abdominal pain and diarrhea 1 week prior to this admission. The patient was also treated with Cipro for  UTI. His creatinine increased from 1.2 to 6.6. For detailed history and physical examination, please refer to the admission note dictated by Dr. Volanda Napoleon. Laboratory data on admission date showed sodium 130, potassium 3.2, chloride 94, bicarbonate 22, BUN 109, creatinine 6.61, glucose 204, lipase 428, AST and ALT normal, bilirubin 1. WBC 9.4, hemoglobin 12.4, INR 1.1.   1.  Acute renal failure, possibly due to dehydration and acute tubular necrosis. After admission, the patient has been treated with aggressive IV fluid support. The patient's creatinine gradually decreased to 2.5 today.  BUN decreased to 42. The patient has no complaints. Urine  output is fine. Dr. Juleen China evaluated the patient and suggested since the patient has MGUS patient needs SPEP and UPEP as outpatient.  2.  Acute pancreatitis, mild, resolved. The patient has no complains of abdominal pain, nausea, vomiting.  3.  Alcohol abuse. The patient has been on CPAP protocol. No signs of alcohol withdrawal.  4.  History of atrial fibrillation on Coumadin. The patient's INR is subtherapeutic. Has been treated with a heparin drip and Coumadin for the past 3 days. INR is still 1.1. The patient is taking Lovenox for bridging as outpatient. The patient needs to continue Lovenox and Coumadin. Follow up INR with PCP.   The patient has no complaints. Vital signs are stable. He is clinically stable. Will be discharged to home today. I discussed the patient's discharge plan with the patient, nurse, case manager.   TIME SPENT: About 38 minutes.   ____________________________ Demetrios Loll, MD qc:am D: 11/07/2014 11:53:41 ET T: 11/07/2014 22:24:19 ET JOB#: [id number]  cc: Demetrios Loll, MD, <Dictator> Demetrios Loll MD ELECTRONICALLY SIGNED 11/08/2014 14:04

## 2015-03-22 NOTE — H&P (Signed)
PATIENT NAME:  Kirk Jones, Kirk Jones MR#:  [id number] DATE OF BIRTH:  1941/08/02  DATE OF ADMISSION:  11/04/2014  REFERRING EMERGENCY ROOM PHYSICIAN: Dr. Archie Balboa   PRIMARY CARE PHYSICIAN: Guadalupe Maple, MD    CHIEF COMPLAINT: Abnormal laboratories, sent by primary care physician.   HISTORY OF PRESENT ILLNESS: This very pleasant 74 year old man with past medical history of atrial fibrillation on Coumadin, hypertension, coronary artery disease presents today at the request of his primary care provider upon receiving elevated creatinine of 6 from laboratories drawn on Friday, December 4th. The patient reports that he saw his primary care provider early last week for symptoms of abdominal pain and diarrhea. At that time he was given a medication to slow down the diarrhea, which seemed to help. He continues to have loose stool, and abdominal pain has improved. One week ago his appetite was very decreased, he had no nausea or vomiting. Currently his appetite has improved. On Friday, when he went in for further evaluation, he had blood work drawn, results of which have returned today. He also had a urinalysis concerning for urinary tract infection and at that time he was started on ciprofloxacin. Additionally, he reports that a few weeks ago he an episode of gout in the left hand and took a course of prednisone. On presentation to the Emergency Room, his creatinine is found to be 6.6, up from 1.2 on November 24th. He is admitted for further evaluation of acute renal failure.   PAST MEDICAL HISTORY:  1.  Hyperlipidemia.  2.  Gastroesophageal reflux disease.  3.  History of CVA with no residual defect.  4.  Atrial fibrillation, on Coumadin. 5.  Mitral regurgitation.  6.  Arthritis.  7.  Cataracts.  8.  History of migraines. 9.  Hypertension.  10.  Coronary artery disease, status post stent placement.  11.  History of monoclonal gammopathy of undetermined significance, released from oncology followup in  June 2015. 12.  Status post mechanical aortic valve replacement 2004. 13.  History of chronic renal insufficiency.  14.  COPD.  SOCIAL HISTORY: The patient lives with his wife. He does not use a cane or a walker for mobility. He does not smoke cigarettes. He does drink alcohol. He quit 1 week ago, but reports that prior to that he was drinking upwards of 5 alcoholic beverages daily. No illicit substance abuse.   FAMILY MEDICAL HISTORY: Positive for coronary artery disease in his mother and father. Congestive heart failure in his father. One sister with lung cancer.   ALLERGIES: No known allergies.   HOME MEDICATIONS:  1.  Ventolin CFC free 90 mcg/inhalations 2 puffs inhaled 4 times a day as needed for shortness of breath.  2.  Spiriva 18 mcg inhalation 1 capsule inhaled daily.  3.  Omeprazole 20 mg 1 capsule twice a day. 4.  Metoprolol succinate 50 mg extended release 1 tablet twice a day. 5.  Magnesium oxide 400 mg 1 tablet once a day. 6.  Lovenox 80 mg/0.8 mL injectable solution 1 injection twice a day for 5 days prior to carpal tunnel release surgery on December 7th. 7.  Losartan 25 mg 1 tablet once a day. 8.  Ketoconazole topical 2% shampoo twice a week.  9.  Furosemide 20 mg 1 tablet twice a day. 10.  Fluticasone nasal 50 mcg/inhalation 1 spray once a day. 11.  Fexofenadine 180 mg 1 tablet once a day. 12.  Ferrous sulfate 325 mg 1 tablet once a day. 13.  Diltiazem  180 mg 24 hour capsule 1 tablet once a day. 14.  Crestor 20 mg, 0.5 tablets once a day. 15.  Coumadin 4 mg 1 tablet once a day.  16.  Ciprofloxacin 500 mg 1 tablet twice a day. 17.  Centrum multivitamin once a day. 18.  Aspirin 81 mg 1 tablet once a day. 19.  Aleve sodium 220 mg 1 tablet twice a day. 20.  Acetaminophen/hydrocodone 325/5 mg 1 to 2 tablets every 4 hours as needed for pain.  21.  Acetaminophen 500 mg 1 tablet every 6 hours as needed for pain.   REVIEW OF SYSTEMS:  CONSTITUTIONAL: Negative for fevers,  chills, weakness or weight change.  HEENT: He does have a history of monocular vision and esotropia, no recent changes, no changes in hearing, no difficulty swallowing, no oral lesions, no sore throat.  PULMONARY: No recent shortness of breath, wheezing, cough, pleuritic-type chest pain.  CARDIOVASCULAR: No recent chest pain, palpitations, syncope. Possible trace edema over the past 2 to 3 weeks.  GASTROINTESTINAL: Positive as mentioned before for decreased appetite and diarrhea as well as abdominal pain. No nausea and vomiting, no hematochezia or melena.  MUSCULOSKELETAL: Negative for joint pain. Positive for episode of gout in the left hand within the past month. No current myalgias or arthralgias.  NEUROLOGIC: No recent focal numbness or weakness. Does have a history of CVA, but no residual defect, no seizure, confusion, dementia, headache.  PSYCHIATRIC: No recent uncontrolled anxiety or depression but positive for heavy alcohol use.   PHYSICAL EXAMINATION:  VITAL SIGNS: Temperature 97.6, pulse 78, respirations 17, blood pressure 101/72, oxygenation 97% on room air.  GENERAL: No acute distress.  HEENT: Pupils are equal, round and reactive to light. Eyes do not track together, but he has vision in both eyes. Conjunctivae are clear with no icterus or injection. Extraocular motion is intact. Oral mucous membranes are pink and moist. Posterior oropharynx is clear with no exudate, edema or erythema.  NECK: Trachea is midline. No cervical lymphadenopathy. Thyroid nontender.  PULMONARY: Lungs clear to auscultation bilaterally with good air movement.  CARDIOVASCULAR: Irregular, rate controlled. No murmurs, rubs or gallops. There is trace pedal edema bilaterally. Peripheral pulses +1.  ABDOMEN: Soft. Mild tenderness in the right upper quadrant. No guarding, no rebound. Bowel sounds are decreased.  MUSCULOSKELETAL: No joint effusion. Range of motion is normal. Strength is 5/5 throughout.  NEUROLOGIC:  Cranial nerves II-XII grossly intact with the exception of discord in vision, strength and sensation intact, nonfocal examination.  PSYCHIATRIC: The patient alert and oriented. No signs of uncontrolled depression or anxiety.   LABORATORY DATA: Sodium 130, potassium 3.2, chloride 94, bicarbonate 22, BUN 109, creatinine 6.61, glucose 204. Lipase 428, calcium 8.4, total protein 6.2, albumin 2.5, bilirubin 1.0, alkaline phosphatase 500. AST/ALT normal. White blood cells 9.4, hemoglobin 12.4, platelets 278,000, MCV 105. UA shows 49 white blood cells, 12 red blood cells. INR is 1.1.   IMAGING: None.   ASSESSMENT AND PLAN:  1.  Acute renal failure: Creatinine going from 1.2 to 6.6 over the past 3 weeks. This may be due to acute tubular necrosis with hypovolemia after diarrhea, decreased p.o. intake and continued administration of antihypertensive medications. He does have white blood cells in his urine and we will send UA for culture. This may also be acute interstitial nephritis. Ciprofloxacin was started on Friday, initial laboratory results are from Friday so this is not likely the culprit. I do not see any other medications on his list that would  have caused AIN. We will get a renal ultrasound. Hydrate. Order a nephrology consultation. Of note, the patient reports that he has had acute renal failure due to medications in the past with resolution, the cause was an antibiotic but the family does not remember which one. He does have a history of monoclonal gammopathy of undetermined significance, which may be contributing. If renal function does not improve, could consider SPEP and UPEP. 2.  Pancreatitis: This is likely alcoholic pancreatitis, which is resolving. His episode last week of acute abdominal pain and diarrhea is very suggestive of pancreatitis. He reports drinking greater than 5 alcoholic beverages daily prior to last week. He has since stopped drinking alcohol and I am guessing that his lipase is  actually lower today than it was prior. He is n.p.o. at this time. Recheck lipase in the morning.  3.  Alcohol abuse: Start CIWA protocol. The patient reports that it has been 7 days since his last drink. He understands that alcohol consumption is causing some of his medical conditions.  4.  Hypokalemia: We will replete and recheck.  5.  Macrocytosis: Likely due to chronic bone marrow suppression due to alcohol use.  6.  Urinary tract infection: For now, we will hydrate and send urine for culture. He does not have any dysuria, fevers or leukocytosis. If urine culture is positive for bacterial pathogen, we will start antibiotics at that time. For now, we will hold off.  7.  Carpal tunnel release surgery on the right today: He will have IV morphine p.r.n. for pain control.  8.  Hyponatremia: Likely due to decreased p.o. intake. We will start normal saline and recheck in the morning.  9.  Hyperglycemia: Check a hemoglobin A1c. 10.  History of coronary artery disease: He has no chest pain at this time, no other anginal equivalent. No ischemic workup at this time. We will continue with aspirin. Hold off on statin therapy. Continue beta blocker.  11.  Atrial fibrillation: Rate is controlled. Continue diltiazem and metoprolol.  12.  Gastroesophageal reflux disease: Continue PPI.  13.  Prophylaxis: Heparin for deep venous thrombosis prophylaxis as he resumes Coumadin therapy. Proton pump inhibitor for gastrointestinal prophylaxis.   TIME SPENT ON ADMISSION: Fifty-five minutes.   ____________________________ Earleen Newport. Volanda Napoleon, MD cpw:TT D: 11/04/2014 19:32:54 ET T: 11/04/2014 20:09:36 ET JOB#: [id number]  cc: Earleen Newport. Volanda Napoleon, MD, <Dictator> Aldean Jewett MD ELECTRONICALLY SIGNED 11/05/2014 12:40

## 2015-03-22 NOTE — H&P (Signed)
PATIENT NAME:  Kirk Jones, Kirk Jones MR#:  [id number] DATE OF BIRTH:  02/02/1941  DATE OF ADMISSION:  11/16/2014  REFERRING PHYSICIAN: Valli Glance. Owens Shark, MD   PRIMARY CARE PHYSICIAN: Guadalupe Maple, MD  CARDIOLOGIST: Isaias Cowman, MD, at [facility]   CHIEF COMPLAINT: Defibrillator discharge.   HISTORY OF PRESENT ILLNESS: A 74 year old Caucasian gentleman with history of coronary artery disease status post PCI and stent placement as well as atrial fibrillation,  mechanical aortic valve, AICD placement presenting after defibrillator discharge. He was recently discharged from [facility] on 11/07/2014  with discharge diagnoses of acute kidney on chronic kidney disease as well as acute alcoholic pancreatitis. States he has had no further alcohol for about 2 weeks now. He states he was in his usual state of health today with normal level of activity. While at rest at home states he had a "cold feeling running through my chest." Later described as pressure followed by his AICD discharge. After that he felt retrosternal chest pain; however, currently, he is symptom free. Of note, in the Emergency Department, he was started on amiodarone drip and a repeat run ventricular tachycardia lasting about 3 seconds in total. However, he is asymptomatic at this time.   REVIEW OF SYSTEMS:  CONSTITUTIONAL: No fevers, chills, fatigue.  EYES: Denies blurred vision, double vision, or eye pain.  EAR, NOSE, THROAT: Denies tinnitus, ear pain, or hearing loss. RESPIRATORY: Denies cough, wheeze, shortness of breath.  CARDIOVASCULAR: Positive for chest pain as described above. Denies any palpitations or edema.  GASTROINTESTINAL: Vomiting, diarrhea, abdominal pain. GENITOURINARY: Denies dysuria or hematuria.  ENDOCRINE: Denies nocturia or thyroid problems.  HEMATOLOGIC AND LYMPHATIC: Denies easy bruising and bleeding.  SKIN: Denies rashes, lesions.  MUSCULOSKELETAL: Denies pain in neck, back, shoulder,  knees, hips or arthritic symptoms.  NEUROLOGIC: Denies paralysis, paresthesias.  PSYCHIATRIC: Denies anxiety or depressive symptoms. Other a full review of systems performed by me is negative.   PAST MEDICAL HISTORY: Hyperlipidemia, unspecified; CVA without residual neurological deficit; chronic atrial fibrillation on warfarin for anticoagulation; history of TIA; hypertension, essential; coronary artery disease status post PCI and stent placement; mechanical aortic valve; COPD, non-oxygen dependent; as well as history of AICD placement.   SOCIAL HISTORY: Remote tobacco use. Recently stopped drinking alcohol as well, no alcohol in greater than 1 week.   FAMILY HISTORY: Positive for coronary artery disease.   ALLERGIES: No known drug allergies.   HOME MEDICATIONS: Include: Acetaminophen 500 mg p.o. q.6 hours as needed for pain; aspirin 81 mg p.o. daily; losartan 25 mg p.o. daily; diltiazem 180 mg p.o. daily; warfarin 4 mg p.o. daily; Lovenox 80 mg injections p.o. b.i.d. until warfarin therapeutic; fexofenadine 180 mg p.o. daily; Crestor 20 mg half tablet p.o. daily; metoprolol 50 mg p.o. b.i.d.; Spiriva 18 mcg inhalation once daily; Ventolin 90 mcg inhalation 2 puffs 4 times a day as needed for shortness of breath; Lasix 20 mg p.o. b.i.d.; Feosol 325 mg p.o. daily; magnesium 400 mg p.o. daily; Flonase 50 mcg inhalation nasal spray daily; Prilosec 20 mg p.o. b.i.d.; multivitamin 1 tablet p.o. daily.   PHYSICAL EXAMINATION:  VITAL SIGNS: Temperature 98, heart rate currently 93, respirations 16, blood pressure 142/86, saturating 92%. Currently in atrial fibrillation. Weight 78.7 kg, BMI of 28.9.  GENERAL: Chronically ill-appearing Caucasian gentleman, currently in no acute distress.  HEAD: Normocephalic, atraumatic.  EYES: Pupils equal, round, reactive to light. Extraocular muscles intact. No scleral icterus.  MOUTH: Moist mucosal membrane. Dentition intact. No abscess noted.  EAR, NOSE,  THROAT: Clear  without exudates. No external lesions.  NECK: Supple. No thyromegaly. No nodules. No JVD.  PULMONARY: Clear to auscultation bilaterally without wheezes, rales, rhonchi. No use of accessory muscle. Good respiratory effort.  CHEST: Nontender to palpation.  CARDIOVASCULAR: S1, S2, irregular rate, irregular rhythm without murmurs, rubs, or gallops. No edema. Pedal pulses 2+ bilaterally.  GASTROINTESTINAL: Soft, nontender, nondistended. No masses. Positive bowel sounds. No hepatosplenomegaly.  MUSCULOSKELETAL: No swelling, clubbing, or edema. Range of motion full in all extremities.  NEUROLOGIC: Cranial nerves II-XII intact. No gross focal neurological deficits. Sensation intact. Reflexes intact.  SKIN: No ulceration, lesions, rashes, or cyanosis. Skin warm, dry. Turgor intact.  PSYCHIATRIC: Mood and affect within normal limits. Patient awake, alert, oriented x 3. Insight and judgment intact.   LABORATORY AND RADIOGRAPHIC DATA: Sodium 140; potassium 4.1; chloride 106; bicarbonate 27; BUN 14; creatinine 1.46, which is trending downward from prior admission; glucose 131; magnesium of 1.4. LFTs: Albumin of 3.1, alkaline phosphatase 614, AST 68, ALT 102. Troponin 0.92. WBC 10.1, hemoglobin 11.8, platelets of 274,000. INR of 1.4. Chest x-ray performed reveals slight interstitial prominence, however, without any pulmonary edema. EKG performed: Multiple PACs, PVCs, however, appears to have underlying sinus rhythm despite the initial EKG. Currently, he is in atrial fibrillation on telemetry.   ASSESSMENT AND PLAN: A 74 year old Caucasian male with a history of coronary artery disease status post percutaneous coronary intervention and stent placement, atrial fibrillation on warfarin therapy, mechanical aortic valve, automatic implantable cardiac defibrillator placement presenting after his defibrillator discharged. 1.  Defibrillator discharge. Place on telemetry. We will initiate aspirin and statin therapy.  Cardiology consult; we will consult Dr. Saralyn Pilar. Correct electrolytes, potassium to 4-5, magnesium to 2. Continue with amiodarone drip as started in the Emergency Department.  2.  Hypomagnesemia replace to a magnesium goal of 2.  3.  Atrial fibrillation, chronic. Continue warfarin therapy. He currently is subtherapeutic. We will continue with Lovenox as well as Cardizem for rate control.  4.  Hypertension. Losartan and Toprol.  5.  Gastroesophageal reflux disease. Proton pump inhibitor therapy. 6.  Venous thromboembolism prophylaxis. Continue Lovenox while subtherapeutic on warfarin.  CODE STATUS: Full code.   TIME SPENT: 45 minutes.    ____________________________ Aaron Mose. Hower, MD dkh:bm D: 11/16/2014 02:45:33 ET T: 11/16/2014 03:13:58 ET JOB#: [id number]  cc: Aaron Mose. Hower, MD, <Dictator> DAVID Woodfin Ganja MD ELECTRONICALLY SIGNED 11/16/2014 20:28

## 2015-03-26 NOTE — Consult Note (Signed)
PATIENT NAME:  Kirk Jones MR#:  [id number] DATE OF BIRTH:  02/02/1941  DATE OF CONSULTATION:  11/16/2014  CONSULTING PHYSICIAN:  Dwayne D. Callwood, MD  INDICATION:  Non-Q-wave myocardial infarction with palpable ventricular tachycardia, AICD   PRIMARY CARE PHYSICIAN:  Dr. Jeananne Rama    CARDIOLOGIST:   HISTORY OF PRESENT ILLNESS:  [name] is a 74 year old white male with history of coronary artery disease, PCI and stent, atrial fibrillation and aortic valve replacement with a mechanical valve AICD placement after presented with a defibrillator discharge. The patient states he was at home the defibrillator went off.  The patient was recently in the hospital for acute kidney disease as well as alcoholic pancreatitis.  He states that began about 2 weeks ago now and he is doing better. He had some medication changes, which seemed to help. He was taken off of Crestor and at some point his aspirin was held.  is normal with activity at home. He had a feeling going to his chest followed by pressure and he  to be discharged. After that he  of substernal pain then felt reasonably well.  He called [employer] to have his defibrillator analyzed. He also called physician on call and the patient was sent to the Emergency Room for evaluation. The patient was placed on amiodarone and has had some short runs of ventricular tachycardia. The AICD was analyzed and he was found to be in  which accounted for the shock.  The patient has had dyspnea, shortness of breath with any activity and now presents for further evaluation. Troponins were elevated up to 16.    REVIEW OF SYSTEMS:  He has had no blackout spells or syncope. No nausea or vomiting. Denies fever, chills, sweats. Denies weight loss or weight gain.   discharge.  PAST MEDICAL HISTORY: Hyperlipidemia, possible CVA in the past, atrial fibrillation, aortic valve replacement with mechanical valve transient  , TIA, hypertension,  and COPD. Marland Kitchen     SOCIAL  HISTORY: Remote history of smoking, recently quit. Denies significant alcohol consumption, states he quit about a week ago which may have prompted his liver disease.   FAMILY HISTORY:  Coronary artery disease.  ALLERGIES:  None.  MEDICATIONS:  He was on Tylenol p.r.n., aspirin, which was discontinued, Losartan 25 daily, diltiazem 180 daily, Coumadin 4 mg daily, Lovenox mg injection twice a day   880 mg daily  which is 20 mg a day, metoprolol 50 mg twice a day, Spiriva inhalers once a day, Ventolin 2 puffs 4 times a day, Lasix 40 mg twice a day, iron 325 daily, magnesium 400 daily, Flonase  nasal spray daily, Prilosec 20 mg twice a day, multivitamin once a day.   PHYSICAL EXAMINATION:  VITAL SIGNS: Blood pressure 140/80, pulse rate of 90, respiratory rate of 16, afebrile.   HEENT:  Normocephalic, atraumatic LUNGS:  Bilateral rhonchi, no significant rales.  No significant wheezing.  HEART: Regular rate and rhythm without murmur at the left sternal border and aortic valve  mechanical valve.  ABDOMEN: Benign.   EXTREMITIES:  wnl NEUROLOGIC: Intact.  SKIN: Normal.  LABORATORY DATA:  Sodium 147  chloride of 106, bicarbonate 27, BUN 14, creatinine 1.46, glucose of 131, magnesium 1.4. LFTs: AST 68, ALT 102, troponin was 0.9, , white count 10, hemoglobin 11.8, platelet count OF 274.   CHEST X-RAY:  Essentially unremarkable.   EKG: Atrial fibrillation and multiple PVCs, nonspecific EKG changes.  IMPRESSION:  .  Atrial fibrillation.  ASSESSMENT:  AICD discharge,  myocardial infarction, hypomagnesemia, atrial fibrillation, coronary artery disease ventricular tachycardia with torsade, and hypertension, acute on chronic renal insufficiency, reflux, mild obesity, and history of alcohol abuse.   PLAN:  1.  He is on Lovenox therapy instead of heparin for anticoagulation. We will probably hold Coumadin for now. I recommend cardiac catheterization.  I do not believe a component of 16 is indicative just from  1 defibrillator discharge and this probably represents some sort of coronary event.  2.  I recommend cardiac catheterization prior to discharge.  We will bridge his anticoagulation with Lovenox because of the mechanical valve and switch to Coumadin after catheterization  agree that amiodarone to  torsades for now, switch to p.o. therapy to help with atrial fibrillation.   electrolytes, I doubt is possible, echocardiogram for overall LV function.  4.  Continue anticoagulation for atrial fibrillation, rate  control with metoprolol and Cardizem.  5.  Hypertension, continue blood pressure control on Losartan, Toprol and Cardizem as well, reflux therapy with Protonix. We will consider loading the patient with Plavix prior to cardiac catheterization.  6.  Continue to have the patient refrain from alcohol abuse.  7.  Continue inhalers for chronic obstructive pulmonary disease.  8.  We will recommend EP evaluation if no intervention needed for coronary standpoint.  Continue amiodarone  proceed with cardiac catheterization prior to discharge.    ____________________________ [name], MD ddc:at D: 11/17/2014 09:38:49 ET T: 11/17/2014 15:34:35 ET JOB#: [id number]  cc: Dwayne D. Clayborn Bigness, MD, <Dictator> Yolonda Kida MD ELECTRONICALLY SIGNED 12/06/2014 22:26

## 2015-03-30 NOTE — Consult Note (Signed)
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 with elevated troponins known history of coronary disease PCI and stent will prior proceed with cardiac catheterization on Monday   VITAL SIGNS/ANCILLARY NOTES: **Vital Signs.:   20-Dec-15 11:30  Vital Signs Type Routine  Temperature Temperature (F) 98.8  Celsius 37.1  Temperature Source oral  Pulse Pulse 85  Respirations Respirations 20  Systolic BP Systolic BP 259  Diastolic BP (mmHg) Diastolic BP (mmHg) 68  Mean BP 79  Pulse Ox % Pulse Ox % 92  Pulse Ox Activity Level  At rest  Oxygen Delivery Room Air/ 21 %  *Intake and Output.:   20-Dec-15 11:52  Grand Totals Intake:   Output:  0    Net:  0 24 Hr.:  -240  Urine ml     Out:  0   Brief Assessment:  GEN well developed, well nourished, no acute distress   Cardiac Irregular  murmur present  -- thrills  -- LE edema   Respiratory normal resp effort   Gastrointestinal Normal   Gastrointestinal details normal Soft  Nontender  Nondistended  No masses palpable   EXTR negative cyanosis/clubbing, negative edema   Lab Results: Routine Chem:  20-Dec-15 04:22   Magnesium, Serum 1.8 (1.8-2.4 THERAPEUTIC RANGE: 4-7 mg/dL TOXIC: > 10 mg/dL  -----------------------)  Cholesterol, Serum 145  Triglycerides, Serum 119  HDL (INHOUSE)  33  VLDL Cholesterol Calculated 24  LDL Cholesterol Calculated 88 (Result(s) reported on 17 Nov 2014 at 05:34AM.)  Routine Coag:  20-Dec-15 04:22   Prothrombin  18.2  INR 1.5 (INR reference interval applies to patients on anticoagulant therapy. A single INR therapeutic range for coumarins is not optimal for all indications; however, the suggested range for most indications is 2.0 - 3.0. Exceptions to the INR Reference Range may include: Prosthetic heart valves, acute myocardial infarction, prevention of myocardial infarction, and combinations of aspirin and  anticoagulant. The need for a higher or lower target INR must be assessed individually. Reference: The Pharmacology and Management of the Vitamin K  antagonists: the seventh ACCP Conference on Antithrombotic and Thrombolytic Therapy. DGLOV.5643 Sept:126 (3suppl): N9146842. A HCT value >55% may artifactually increase the PT.  In one study,  the increase was an average of 25%. Reference:  "Effect on Routine and Special Coagulation Testing Values of Citrate Anticoagulant Adjustment in Patients with High HCT Values." American Journal of Clinical Pathology 2006;126:400-405.)   Radiology Results: XRay:    19-Dec-15 00:28, Chest Portable Single View  Chest Portable Single View   REASON FOR EXAM:    chest pressure  COMMENTS:       PROCEDURE: DXR - DXR PORTABLE CHEST SINGLE VIEW  - Nov 16 2014 12:28AM     CLINICAL DATA:  Acute onset of chest pain and dyspnea. Status post  discharge of defibrillator tonight at 10:45 p.m. Initial encounter.    EXAM:  PORTABLE CHEST - 1 VIEW    COMPARISON:  Chest radiograph performed 10/25/2014    FINDINGS:  The lungs are well-aerated. Blunting is noted at the costophrenic  angles bilaterally. There is slight interstitial prominence. There  is no evidence of pleural effusion or pneumothorax.    The cardiomediastinal silhouette is borderline normal in size. The  patient is status post median sternotomy. An AICD is noted overlying  the left chest wall, with a single lead ending overlying the right  ventricle. No acute osseous abnormalities are seen.  IMPRESSION:  Slight interstitial prominence, without definite pulmonary edema.      Electronically Signed    By: Garald Balding M.D.    On: 11/16/2014 00:40     Verified By: JEFFREY . CHANG, M.D.,  Cardiology:    19-Dec-15 00:03, ED ECG  Ventricular Rate 99  Atrial Rate 99  P-R Interval 192  QRS Duration 128  QT 376  QTc 482  R Axis -39  T Axis 59  ECG interpretation   Sinus rhythm with  occasional , and consecutive Premature ventricular complexes and Premature atrial complexes  Left axis deviation  Non-specific intra-ventricular conduction block  Abnormal ECG  When compared with ECG of 06-Jan-2009 10:06,  Sinus rhythm has replaced Atrial fibrillation  Non-specific intra-ventricular conduction block has replaced Right bundle branch block  ----------unconfirmed----------  Confirmed by OVERREAD, NOT ([id number]), editor PEARSON, BARBARA ([id number]) on 11/18/2014 2:45:40 PM  ED ECG     19-Dec-15 13:45, Echo Doppler  Echo Doppler   REASON FOR EXAM:      COMMENTS:       PROCEDURE: [facility] - ECHO DOPPLER COMPLETE(TRANSTHOR)  - Nov 16 2014  1:45PM     RESULT: Echocardiogram Report    Patient Name:   Kirk Jones Date of Exam: 11/16/2014  Medical Rec #:  [id number]              Custom1:  Date of Birth:  02/02/1941            Height:       65.0 in  Patient Age:    74 years            Weight:       173.0 lb  Patient Gender: M                   BSA:          1.86 m??    Indications: MI  Sonographer:    Arville Go RDCS  Referring Phys: Valentino Nose, K    Sonographer Comments: Technically difficult study due to poor echo   windows.    Summary:   1. Left ventricular ejection fraction, by visual estimation, is 50 to   55%.   2. Mildly decreased global left ventricular systolic function.   3. Mildly increased left ventricular septal thickness.   4. Mildly dilated left atrium.   5. Mildly dilated right atrium.   6. Moderate mitral valve regurgitation.   7. Mild aortic valve stenosis.   8. Moderate to severe tricuspid regurgitation.   9. Pulmonary hypertension is present.  10. Severely elevated pulmonary artery systolic pressure.  11. Mildly increased left ventricular posterior wall thickness.  LV DIASTOLIC FUNCTION:  MV Peak E: 1.41 m/s  SPECTRAL DOPPLER ANALYSIS (where applicable):  Aortic Valve: AoV Max Vel: 2.57 m/s AoV Peak PG: 26.4 mmHg AoV Mean PG:   13.5 mmHg  Tricuspid  Valve and PA/RV Systolic Pressure: TR Max Velocity: 3.98 m/s RA   Pressure: 10 mmHg RVSP/PASP: 73.4 mmHg    PHYSICIAN INTERPRETATION:  Left Ventricle: Not well seen. LV septal wall thickness was mildly   increased. LV posterior wall thickness was mildly increased. Global LV   systolic function was mildly decreased. Left ventricular ejection     fraction, by visual estimation, is 50 to 55%.  Right Ventricle: The right ventricle was not well seen. The right   ventricular size is normal. Global RV systolic function is low normal.  Left Atrium: The left atrium is  mildly dilated.  Right Atrium: The right atrium is mildly dilated.  Pericardium: There is no evidence of pericardial effusion.  Mitral Valve: The mitral valve is normal in structure. Moderate mitral   valve regurgitation is seen.  Tricuspid Valve: The tricuspid valve is normal. Moderate to severe   tricuspid regurgitation is visualized. The tricuspid regurgitant velocity   is 3.98 m/s, and with an assumed right atrial pressure of 10 mmHg, the   estimated right ventricular systolic pressure is severely elevated at   73.4 mmHg.  Aortic Valve: The aortic valve is normal. Mild aortic stenosis is   present. No evidence of aortic valve regurgitation is seen.  Pulmonic Valve: The pulmonic valve is normal.  Pulmonary Artery: The pulmonary artery is not well visualized. Pulmonary   hypertension is present.  Additional Comments: A pacer wire is visualized.    Severy MD  Electronically signed by [place] MD  Signature Date/Time: 11/16/2014/2:36:13 PM    *** Final ***    IMPRESSION: .        Verified By: Yolonda Kida, M.D., MD   Assessment/Plan:  Assessment/Plan:  Assessment IMP  non STEMI  elevated troponin  hypertension  coronary disease   AFib  Chronic obstructive pulmonary disease  cardiomyopathy  history of coronary bypass surgery  AICD   hypermagnesemia  unstable angina .   Plan  PLAN  and continued telemetry  hold Coumadin in anticipation of the cardiac catheterization  continue Lovenox therapy for anticoagulation  AICD in place has not discharged  inhalers for Chronic obstructive pulmonary disease therapy  renal insufficiency continue to follow  possible and I could myocardial infarction proceed with invasive strategy  will have Dr. AP proceed with cardiac catheterization on Monday   Electronic Signatures: Yolonda Kida (MD)  (Signed 23-Jan-16 09:53)  Authored: Chief Complaint, VITAL SIGNS/ANCILLARY NOTES, Brief Assessment, Lab Results, Radiology Results, Assessment/Plan   Last Updated: 23-Jan-16 09:53 by Lujean Amel D (MD)

## 2015-03-30 NOTE — Discharge Summary (Signed)
PATIENT NAME:  Kirk Jones, Kirk Jones MR#:  [id number] DATE OF BIRTH:  March 01, 1941  DATE OF ADMISSION:  11/16/2014 DATE OF DISCHARGE:  11/20/2014  DISCHARGE DIAGNOSES:  1. Non-ST elevation myocardial infarction with a history of coronary artery disease  and stent.  2. Implantable cardiac defibrillator shock for ventricular tachycardia and ventricular fibrillation.  3. Atrial fibrillation. 4. History of cerebrovascular accident.  5. Chronic obstructive pulmonary disease.  6. Chronic kidney disease stage 3.  7. Monoclonal gammopathy of undetermined significance.   CONDITION: Stable.   CODE STATUS: Full code.   HOME MEDICATIONS: Please refer to the medication reconciliation list.   DIET: Low-sodium, low-fat, low-cholesterol diet.   ACTIVITY: As tolerated.   FOLLOW-UP CARE: Follow with PCP and Dr. Saralyn Pilar within 1 to 2 weeks. The patient also needs follow-up INR with PCP or cardiologist within 2 days.   REASON FOR ADMISSION: Defibrillator discharge.   HOSPITAL COURSE: The patient is a 75 year old Caucasian gentleman with a history of CAD status post PCI and stent placement and atrial fibrillation with a mechanical aortic wall ICD placement, presented to the ED with defibrillator discharge. For the details of the history and physical examination, please refer to the admission note dictated by Dr. Lavetta Nielsen on admission date. The patient had EKG that showed multiple PACs and PVCs and also in atrial fibrillation on telemetry.   1. Defibrillator discharge:  The patient was placed on telemetry monitor, was started with aspirin and statin. The patient was treated with amiodarone. The patient's troponin level was elevated from 0.92 to 16 to 29. Dr. Clayborn Bigness suggested the patient has non-STEMI heparin drip. The patient got cardiac catheterization which showed insignificant CAD with 60% stenosis, mid-RCA with a negative FFR.  2. The patient's ICD shocks for V. tach and V. fib, no recurrence, converted. Dr.  Saralyn Pilar suggested change to p.o. amiodarone 40 mg p.o. b.i.d. for 10 days, then reduce 40 mg daily. The patient was restarted on Coumadin before discharge, Since the patient's INR was nontherapeutic, the patient was discharged with Lovenox and Coumadin the patient needs follow-up INR as outpatient.  3. The patient's atrial fibrillation has been treated with heparin drip, Cardizem and Coumadin.  4. Hypertension, controlled with losartan and Toprol.  5. The patient has no complaints. The patient's vital signs were stable. He was clinically stable and discharged to home on 11/20/2014.   I discussed the patient's discharge plan with the patient, nurse, case manager.   TIME SPENT: About 39 minutes.    ____________________________ Demetrios Loll, MD qc:kl D: 12/18/2014 13:34:05 ET T: 12/18/2014 13:52:47 ET JOB#: [id number]  cc: Demetrios Loll, MD, <Dictator> Demetrios Loll MD ELECTRONICALLY SIGNED 12/18/2014 16:56

## 2015-03-30 NOTE — Consult Note (Signed)
Chief Complaint:  Subjective/Chief Complaint Patient states to be doing okay no further chest pain symptoms ready for cardiac catheterization tomorrow   VITAL SIGNS/ANCILLARY NOTES: **Vital Signs.:   21-Dec-15 08:51  Vital Signs Type Pre Medication  Pulse Pulse 89  Systolic BP Systolic BP 782  Diastolic BP (mmHg) Diastolic BP (mmHg) 76  Mean BP 89  *Intake and Output.:   21-Dec-15 09:00  Grand Totals Intake:   Output:      Net:   60 Hr.:  -800  Dietary Restrictions  NPO   Brief Assessment:  GEN well developed, well nourished, no acute distress   Cardiac Irregular  murmur present  -- JVD   Respiratory normal resp effort  clear BS  no use of accessory muscles   Gastrointestinal Normal   Gastrointestinal details normal Soft  Nontender  Nondistended  No masses palpable   EXTR negative cyanosis/clubbing, negative edema   Lab Results: Cardiac Catherization:  21-Dec-15 14:39   Cardiac Catheterization  [facility] [place], [place] [address] [phone]   Cardiovascular Catheterization Comprehensive Report   Patient: Kirk Jones Study date: 11/18/2014 MR number: [id number] Account number: [id number]   DOB: 02/02/1941 Age: 74 years Gender: Male Race: White Height: Weight:   Diagnostic Cardiologist:  Miquel Dunn, PhD, MD Interventional Cardiologist:  Miquel Dunn, PhD, MD   SUMMARY:   -Summary: Insiginificant CAD with 60-70% eccentric stenosis mid RCA with negative FFR 0.91.   CORONARY CIRCULATION: The coronary circulation is right dominant. Proximal LAD: There was a stenosis at the site of a prior stent. Mid LAD: There was a tubular 20 % stenosis. Proximal RCA: There was a 40 % stenosis. Mid RCA: There was a 60 % stenosis.   PROCEDURE: The risks and alternatives of the procedures and conscious sedation were explained to the patient and informed consent was obtained. The patient was brought to the cath lab and placed on  the table. The planned puncture sites were prepped and draped in the usual sterile fashion.   Prepared and signed by   Miquel Dunn, PhD, MD Signed 11/18/2014 16:05:02   STUDY DIAGRAM   Angiographic findings Native coronary lesions:  Mid UXL:KGMWNU 1: tubular, 20 % stenosis.  Proximal RCA: Lesion 1: 40 % stenosis.  Mid RCA: Lesion 1: 60 % stenosis.   HEMODYNAMIC TABLES   Outputs:  Baseline Outputs:  -- CALCULATIONS: Body Surface Area: 1.87  Routine Chem:  21-Dec-15 05:14   Glucose, Serum 93  BUN 17  Creatinine (comp)  1.35  Sodium, Serum 140  Potassium, Serum 4.2  Chloride, Serum 106  CO2, Serum 27  Calcium (Total), Serum 8.5  Anion Gap 7  Osmolality (calc) 281  eGFR (African American) >60  eGFR (Non-African American)  55 (eGFR values <72m/min/1.73 m2 may be an indication of chronic kidney disease (CKD). Calculated eGFR, using the MRDR Study equation, is useful in  patients with stable renal function. The eGFR calculation will not be reliable in acutely ill patients when serum creatinine is changing rapidly. It is not useful in patients on dialysis. The eGFR calculation may not be applicable to patients at the low and high extremes of body sizes, pregnant women, and vegetarians.)  Routine Coag:  21-Dec-15 05:14   Prothrombin  16.7  INR 1.4 (INR reference interval applies to patients on anticoagulant therapy. A single INR therapeutic range for coumarins is not optimal for all indications; however, the suggested range for most indications is 2.0 - 3.0. Exceptions to the INR Reference  Range may include: Prosthetic heart valves, acute myocardial infarction, prevention of myocardial infarction, and combinations of aspirin and anticoagulant. The need for a higher or lower target INR must be assessed individually. Reference: The Pharmacology and Management of the Vitamin K  antagonists: the seventh ACCP Conference on Antithrombotic and Thrombolytic Therapy.  [id number].1191 Sept:126 (3suppl): [id number]. A HCT value >55% may artifactually increase the PT.  In one study,  the increase was an average of 25%. Reference:  "Effect on Routine and Special Coagulation Testing Values of Citrate Anticoagulant Adjustment in Patients with High HCT Values." American Journal of Clinical Pathology 2006;126:400-405.)    18:00   Prothrombin  14.9  INR 1.2 (INR reference interval applies to patients on anticoagulant therapy. A single INR therapeutic range for coumarins is not optimal for all indications; however, the suggested range for most indications is 2.0 - 3.0. Exceptions to the INR Reference Range may include: Prosthetic heart valves, acute myocardial infarction, prevention of myocardial infarction, and combinations of aspirin and anticoagulant. The need for a higher or lower target INR must be assessed individually. Reference: The Pharmacology and Management of the Vitamin K  antagonists: the seventh ACCP Conference on Antithrombotic and Thrombolytic Therapy. YNWGN.5621 Sept:126 (3suppl): [id number]. A HCT value >55% may artifactually increase the PT.  In one study,  the increase was an average of 25%. Reference:  "Effect on Routine and Special Coagulation Testing Values of Citrate Anticoagulant Adjustment in Patients with High HCT Values." American Journal of Clinical Pathology 2006;126:400-405.)  Activated PTT (APTT)  42.6 (A HCT value >55% may artifactually increase the APTT. In one study, the increase was an average of 19%. Reference: "Effect on Routine and Special Coagulation Testing Values of Citrate Anticoagulant Adjustment in Patients with High HCT Values." American Journal of Clinical Pathology 2006;126:400-405.)  Routine Hem:  21-Dec-15 18:00   WBC (CBC) 7.3  RBC (CBC)  3.41  Hemoglobin (CBC)  11.6  Hematocrit (CBC)  36.3  Platelet Count (CBC) 237  MCV  107  MCH 34.0  MCHC 32.0  RDW 13.9  Neutrophil % 64.4  Lymphocyte % 18.2  Monocyte  % 15.0  Eosinophil % 1.2  Basophil % 1.2  Neutrophil # 4.7  Lymphocyte # 1.3  Monocyte #  1.1  Eosinophil # 0.1  Basophil # 0.1 (Result(s) reported on 18 Nov 2014 at 06:17PM.)   Radiology Results: XRay:    19-Dec-15 00:28, Chest Portable Single View  Chest Portable Single View   REASON FOR EXAM:    chest pressure  COMMENTS:       PROCEDURE: DXR - DXR PORTABLE CHEST SINGLE VIEW  - Nov 16 2014 12:28AM     CLINICAL DATA:  Acute onset of chest pain and dyspnea. Status post  discharge of defibrillator tonight at 10:45 p.m. Initial encounter.    EXAM:  PORTABLE CHEST - 1 VIEW    COMPARISON:  Chest radiograph performed 10/25/2014    FINDINGS:  The lungs are well-aerated. Blunting is noted at the costophrenic  angles bilaterally. There is slight interstitial prominence. There  is no evidence of pleural effusion or pneumothorax.    The cardiomediastinal silhouette is borderline normal in size. The  patient is status post median sternotomy. An AICD is noted overlying  the left chest wall, with a single lead ending overlying the right  ventricle. No acute osseous abnormalities are seen.     IMPRESSION:  Slight interstitial prominence, without definite pulmonary edema.      Electronically Signed    By: Jacqulynn Cadet  Chang M.D.    On: 11/16/2014 00:40     Verified By: JEFFREY . CHANG, M.D.,  Cardiology:    19-Dec-15 00:03, ED ECG  Ventricular Rate 99  Atrial Rate 99  P-R Interval 192  QRS Duration 128  QT 376  QTc 482  R Axis -39  T Axis 59  ECG interpretation   Sinus rhythm with occasional , and consecutive Premature ventricular complexes and Premature atrial complexes  Left axis deviation  Non-specific intra-ventricular conduction block  Abnormal ECG  When compared with ECG of 06-Jan-2009 10:06,  Sinus rhythm has replaced Atrial fibrillation  Non-specific intra-ventricular conduction block has replaced Right bundle branch  block  ----------unconfirmed----------  Confirmed by OVERREAD, NOT ([id number]), editor PEARSON, BARBARA ([id number]) on 11/18/2014 2:45:40 PM  ED ECG     19-Dec-15 13:45, Echo Doppler  Echo Doppler   REASON FOR EXAM:      COMMENTS:       PROCEDURE: [facility] - ECHO DOPPLER COMPLETE(TRANSTHOR)  - Nov 16 2014  1:45PM     RESULT: Echocardiogram Report    Patient Name:   Kirk Jones Date of Exam: 11/16/2014  Medical Rec #:  [id number]              Custom1:  Date of Birth:  02/02/1941            Height:       65.0 in  Patient Age:    69 years            Weight:       173.0 lb  Patient Gender: M                   BSA:          1.86 m??    Indications: MI  Sonographer:    Arville Go RDCS  Referring Phys: Valentino Nose, K    Sonographer Comments: Technically difficult study due to poor echo   windows.    Summary:   1. Left ventricular ejection fraction, by visual estimation, is 50 to   55%.   2. Mildly decreased global left ventricular systolic function.   3. Mildly increased left ventricular septal thickness.   4. Mildly dilated left atrium.   5. Mildly dilated right atrium.   6. Moderate mitral valve regurgitation.   7. Mild aortic valve stenosis.   8. Moderate to severe tricuspid regurgitation.   9. Pulmonary hypertension is present.  10. Severely elevated pulmonary artery systolic pressure.  11. Mildly increased left ventricular posterior wall thickness.  LV DIASTOLIC FUNCTION:  MV Peak E: 1.41 m/s  SPECTRAL DOPPLER ANALYSIS (where applicable):  Aortic Valve: AoV Max Vel: 2.57 m/s AoV Peak PG: 26.4 mmHg AoV Mean PG:   13.5 mmHg  Tricuspid Valve and PA/RV Systolic Pressure: TR Max Velocity: 3.98 m/s RA   Pressure: 10 mmHg RVSP/PASP: 73.4 mmHg    PHYSICIAN INTERPRETATION:  Left Ventricle: Not well seen. LV septal wall thickness was mildly   increased. LV posterior wall thickness was mildly increased. Global LV   systolic function was mildly decreased. Left ventricular ejection      fraction, by visual estimation, is 50 to 55%.  Right Ventricle: The right ventricle was not well seen. The right   ventricular size is normal. Global RV systolic function is low normal.  Left Atrium: The left atrium is mildly dilated.  Right Atrium: The right atrium is mildly dilated.  Pericardium: There is no evidence of pericardial effusion.  Mitral  Valve: The mitral valve is normal in structure. Moderate mitral   valve regurgitation is seen.  Tricuspid Valve: The tricuspid valve is normal. Moderate to severe   tricuspid regurgitation is visualized. The tricuspid regurgitant velocity   is 3.98 m/s, and with an assumed right atrial pressure of 10 mmHg, the   estimated right ventricular systolic pressure is severely elevated at   73.4 mmHg.  Aortic Valve: The aortic valve is normal. Mild aortic stenosis is   present. No evidence of aortic valve regurgitation is seen.  Pulmonic Valve: The pulmonic valve is normal.  Pulmonary Artery: The pulmonary artery is not well visualized. Pulmonary   hypertension is present.  Additional Comments: A pacer wire is visualized.    [place] MD  Electronically signed by [place] MD  Signature Date/Time: 11/16/2014/2:36:13 PM    *** Final ***    IMPRESSION: .        Verified By: Yolonda Kida, M.D., MD   Assessment/Plan:  Assessment/Plan:  Assessment IMP  non STEMI  coronary disease  elevated troponin  ischemic cardiomyopathy  hypertension  AFib  chronic renal insufficiency  Chronic obstructive pulmonary disease  history of CVA  AICD  history of coronary bypass surgery. .   Plan PLAN  cardiac catheterization for evaluation of elevated troponin  continue to hold Coumadin  medical therapy with Lovenox for anticoagulation until cardiac catheterization  rate control for atrial fibrillation with Coreg diltiazem p.r.n.  follow up renal insufficiency which is improved  continue hypertension control   Chronic obstructive pulmonary disease inhalers as necessary  correct electrolytes accordingly  stable for cardiac catheterization with AP   Electronic Signatures: Yolonda Kida (MD)  (Signed 23-Jan-16 09:58)  Authored: Chief Complaint, VITAL SIGNS/ANCILLARY NOTES, Brief Assessment, Lab Results, Radiology Results, Assessment/Plan   Last Updated: 23-Jan-16 09:58 by Lujean Amel D (MD)

## 2015-03-31 DIAGNOSIS — M109 Gout, unspecified: Secondary | ICD-10-CM | POA: Diagnosis not present

## 2015-03-31 DIAGNOSIS — Z952 Presence of prosthetic heart valve: Secondary | ICD-10-CM | POA: Diagnosis not present

## 2015-03-31 DIAGNOSIS — Z953 Presence of xenogenic heart valve: Secondary | ICD-10-CM | POA: Diagnosis not present

## 2015-03-31 DIAGNOSIS — E785 Hyperlipidemia, unspecified: Secondary | ICD-10-CM | POA: Diagnosis not present

## 2015-03-31 DIAGNOSIS — I10 Essential (primary) hypertension: Secondary | ICD-10-CM | POA: Diagnosis not present

## 2015-03-31 DIAGNOSIS — Z7901 Long term (current) use of anticoagulants: Secondary | ICD-10-CM | POA: Diagnosis not present

## 2015-03-31 DIAGNOSIS — R945 Abnormal results of liver function studies: Secondary | ICD-10-CM | POA: Diagnosis not present

## 2015-04-14 DIAGNOSIS — Z952 Presence of prosthetic heart valve: Secondary | ICD-10-CM | POA: Diagnosis not present

## 2015-04-14 DIAGNOSIS — I4891 Unspecified atrial fibrillation: Secondary | ICD-10-CM | POA: Diagnosis not present

## 2015-04-18 DIAGNOSIS — I70213 Atherosclerosis of native arteries of extremities with intermittent claudication, bilateral legs: Secondary | ICD-10-CM | POA: Diagnosis not present

## 2015-04-18 DIAGNOSIS — I10 Essential (primary) hypertension: Secondary | ICD-10-CM | POA: Diagnosis not present

## 2015-04-18 DIAGNOSIS — M79609 Pain in unspecified limb: Secondary | ICD-10-CM | POA: Diagnosis not present

## 2015-04-18 DIAGNOSIS — I509 Heart failure, unspecified: Secondary | ICD-10-CM | POA: Diagnosis not present

## 2015-04-18 DIAGNOSIS — M7989 Other specified soft tissue disorders: Secondary | ICD-10-CM | POA: Diagnosis not present

## 2015-04-18 DIAGNOSIS — E785 Hyperlipidemia, unspecified: Secondary | ICD-10-CM | POA: Diagnosis not present

## 2015-04-18 DIAGNOSIS — I2510 Atherosclerotic heart disease of native coronary artery without angina pectoris: Secondary | ICD-10-CM | POA: Diagnosis not present

## 2015-04-29 DIAGNOSIS — Z952 Presence of prosthetic heart valve: Secondary | ICD-10-CM | POA: Diagnosis not present

## 2015-04-29 DIAGNOSIS — Z7901 Long term (current) use of anticoagulants: Secondary | ICD-10-CM | POA: Diagnosis not present

## 2015-05-06 ENCOUNTER — Other Ambulatory Visit: Payer: Self-pay | Admitting: Family Medicine

## 2015-05-13 ENCOUNTER — Ambulatory Visit (INDEPENDENT_AMBULATORY_CARE_PROVIDER_SITE_OTHER): Payer: Medicare Other | Admitting: Family Medicine

## 2015-05-13 ENCOUNTER — Encounter: Payer: Self-pay | Admitting: Family Medicine

## 2015-05-13 VITALS — BP 114/80 | HR 81 | Temp 97.7°F | Ht 64.3 in | Wt 171.0 lb

## 2015-05-13 DIAGNOSIS — I4891 Unspecified atrial fibrillation: Secondary | ICD-10-CM | POA: Insufficient documentation

## 2015-05-13 DIAGNOSIS — Z952 Presence of prosthetic heart valve: Secondary | ICD-10-CM

## 2015-05-13 DIAGNOSIS — I481 Persistent atrial fibrillation: Secondary | ICD-10-CM

## 2015-05-13 DIAGNOSIS — I4819 Other persistent atrial fibrillation: Secondary | ICD-10-CM

## 2015-05-13 LAB — COAGUCHEK XS/INR WAIVED
INR: 4.4 — ABNORMAL HIGH (ref 0.9–1.1)
Prothrombin Time: 53.2 s

## 2015-05-13 NOTE — Progress Notes (Signed)
   BP 114/80 mmHg  Pulse 81  Temp(Src) 97.7 F (36.5 C)  Ht 5' 4.3" (1.633 m)  Wt 171 lb (77.565 kg)  BMI 29.09 kg/m2  SpO2 99%   Subjective:    [name], male    DOB: 04/12/1941, 74 y.o.   MRN: [id number]  HPI: Kirk Jones is a 74 y.o. male  Chief Complaint  Patient presents with  . Anticoagulation    This patient is a 74 year old male who presents for coumadin management. The expected duration of coumadin treatment is lifelong .    The reason for anticoagulation is  Mechanical heart valve .    Present Coumadin dose: '7mg'$  qd Goal:  2.5-3.5  Excessive bruising:   no   Nose bleeding:   no   Rectal bleeding:   no   Eating diet with consistent amounts of foods containing Vitamin K: yes    Any recent antibiotic use?  no   Relevant past medical, surgical, family and social history reviewed and updated as indicated. Interim medical history since our last visit reviewed. Allergies and medications reviewed and updated.  Review of Systems  Constitutional: Negative.   Respiratory: Negative.   Cardiovascular: Negative.     Per HPI unless specifically indicated above     Objective:    BP 114/80 mmHg  Pulse 81  Temp(Src) 97.7 F (36.5 C)  Ht 5' 4.3" (1.633 m)  Wt 171 lb (77.565 kg)  BMI 29.09 kg/m2  SpO2 99%  Wt Readings from Last 3 Encounters:  05/13/15 171 lb (77.565 kg)    Physical Exam  Constitutional: He is oriented to person, place, and time. He appears well-developed and well-nourished. No distress.  HENT:  Head: Normocephalic and atraumatic.  Right Ear: Hearing normal.  Left Ear: Hearing normal.  Nose: Nose normal.  Eyes: Conjunctivae and lids are normal. Right eye exhibits no discharge. Left eye exhibits no discharge. No scleral icterus.  Pulmonary/Chest: Effort normal. No respiratory distress.  Musculoskeletal: Normal range of motion.  Neurological: He is alert and oriented to person, place, and time.  Skin: Skin is intact.  No rash noted.  No brusing  Psychiatric: He has a normal mood and affect. His speech is normal and behavior is normal. Judgment and thought content normal. Cognition and memory are normal.        Assessment & Plan:   Problem List Items Addressed This Visit      Cardiovascular and Mediastinum   Atrial fibrillation (Chronic)    Discussed with diet modification INR not  Coming down as had in past. Will reduce dose to '6mg'$        Relevant Orders   Protime-INR     Other   History of prosthetic heart valve - Primary (Chronic)   Relevant Orders   CoaguChek XS/INR Waived   Protime-INR       Follow up plan: Return in about 2 weeks (around 05/27/2015) for INR check.

## 2015-05-13 NOTE — Assessment & Plan Note (Signed)
Discussed with diet modification INR not  Coming down as had in past. Will reduce dose to '6mg'$ 

## 2015-05-26 ENCOUNTER — Other Ambulatory Visit: Payer: Self-pay

## 2015-05-27 ENCOUNTER — Ambulatory Visit: Payer: Medicare Other | Admitting: Family Medicine

## 2015-05-28 ENCOUNTER — Ambulatory Visit (INDEPENDENT_AMBULATORY_CARE_PROVIDER_SITE_OTHER): Payer: Medicare Other | Admitting: Unknown Physician Specialty

## 2015-05-28 ENCOUNTER — Encounter: Payer: Self-pay | Admitting: Unknown Physician Specialty

## 2015-05-28 VITALS — BP 132/69 | HR 70 | Temp 97.8°F | Ht 64.5 in | Wt 175.4 lb

## 2015-05-28 DIAGNOSIS — I4819 Other persistent atrial fibrillation: Secondary | ICD-10-CM

## 2015-05-28 DIAGNOSIS — Z952 Presence of prosthetic heart valve: Secondary | ICD-10-CM | POA: Diagnosis not present

## 2015-05-28 DIAGNOSIS — E785 Hyperlipidemia, unspecified: Secondary | ICD-10-CM | POA: Diagnosis not present

## 2015-05-28 DIAGNOSIS — M109 Gout, unspecified: Secondary | ICD-10-CM | POA: Insufficient documentation

## 2015-05-28 DIAGNOSIS — J449 Chronic obstructive pulmonary disease, unspecified: Secondary | ICD-10-CM | POA: Insufficient documentation

## 2015-05-28 DIAGNOSIS — I509 Heart failure, unspecified: Secondary | ICD-10-CM | POA: Diagnosis not present

## 2015-05-28 DIAGNOSIS — I10 Essential (primary) hypertension: Secondary | ICD-10-CM | POA: Insufficient documentation

## 2015-05-28 DIAGNOSIS — M7989 Other specified soft tissue disorders: Secondary | ICD-10-CM | POA: Diagnosis not present

## 2015-05-28 DIAGNOSIS — N183 Chronic kidney disease, stage 3 unspecified: Secondary | ICD-10-CM | POA: Insufficient documentation

## 2015-05-28 DIAGNOSIS — M79609 Pain in unspecified limb: Secondary | ICD-10-CM | POA: Diagnosis not present

## 2015-05-28 DIAGNOSIS — I481 Persistent atrial fibrillation: Secondary | ICD-10-CM

## 2015-05-28 DIAGNOSIS — I70213 Atherosclerosis of native arteries of extremities with intermittent claudication, bilateral legs: Secondary | ICD-10-CM | POA: Diagnosis not present

## 2015-05-28 DIAGNOSIS — K2270 Barrett's esophagus without dysplasia: Secondary | ICD-10-CM | POA: Insufficient documentation

## 2015-05-28 DIAGNOSIS — I2510 Atherosclerotic heart disease of native coronary artery without angina pectoris: Secondary | ICD-10-CM | POA: Diagnosis not present

## 2015-05-28 DIAGNOSIS — K5790 Diverticulosis of intestine, part unspecified, without perforation or abscess without bleeding: Secondary | ICD-10-CM | POA: Insufficient documentation

## 2015-05-28 LAB — COAGUCHEK XS/INR WAIVED
INR: 3.3 — AB (ref 0.9–1.1)
Prothrombin Time: 39.6 s

## 2015-05-28 NOTE — Progress Notes (Signed)
   BP 132/69 mmHg  Pulse 70  Temp(Src) 97.8 F (36.6 C)  Ht 5' 4.5" (1.638 m)  Wt 175 lb 6.4 oz (79.561 kg)  BMI 29.65 kg/m2  SpO2 98%   Subjective:    [name], male    DOB: Jan 07, 1941, 74 y.o.   MRN: [id number]  CC: Coumadin management  HPI: This patient is a 74 y.o. male who presents for coumadin management. The expected duration of coumadin treatment is lifelong The reason for anticoagulation is  A. Fib.  Present Coumadin dose: Goal: 2.5-3.5 Excessive bruising: no Nose bleeding: no Rectal bleeding: no Prolonged menstrual cycles: N/A Eating diet with consistent amounts of foods containing Vitamin K:yes Any recent antibiotic use? no  ROS: Per HPI unless specifically indicated above     Objective:    BP 132/69 mmHg  Pulse 70  Temp(Src) 97.8 F (36.6 C)  Ht 5' 4.5" (1.638 m)  Wt 175 lb 6.4 oz (79.561 kg)  BMI 29.65 kg/m2  SpO2 98%  Wt Readings from Last 3 Encounters:  05/28/15 175 lb 6.4 oz (79.561 kg)  05/13/15 171 lb (77.565 kg)     General: Well appearing, well nourished in no distress.  Normal mood and affect. Skin: No excessive bruising or rash  Last INR: 3.8    Last CBC:  Lab Results  Component Value Date   WBC 6.9 11/20/2014   HGB 10.2* 11/20/2014   HCT 31.3* 11/20/2014   MCV 106* 11/20/2014   PLT 219 11/20/2014    Results for orders placed or performed in visit on 05/13/15  CoaguChek XS/INR Waived  Result Value Ref Range   INR 4.4 (H) 0.9 - 1.1   Prothrombin Time 53.2 sec       Assessment:     ICD-9-CM ICD-10-CM   1. History of prosthetic heart valve V43.3 Z95.2   2. Persistent atrial fibrillation 427.31 I48.1 Protime-INR     CoaguChek XS/INR Waived    Plan:   Discussed current plan face-to-face with patient. For coumadin dosing, elected to continue current dose. Will plan to recheck INR in 1 month.

## 2015-06-04 DIAGNOSIS — E119 Type 2 diabetes mellitus without complications: Secondary | ICD-10-CM | POA: Diagnosis not present

## 2015-06-04 DIAGNOSIS — N183 Chronic kidney disease, stage 3 (moderate): Secondary | ICD-10-CM | POA: Diagnosis not present

## 2015-06-04 DIAGNOSIS — R600 Localized edema: Secondary | ICD-10-CM | POA: Diagnosis not present

## 2015-06-04 DIAGNOSIS — D472 Monoclonal gammopathy: Secondary | ICD-10-CM | POA: Diagnosis not present

## 2015-06-04 DIAGNOSIS — I10 Essential (primary) hypertension: Secondary | ICD-10-CM | POA: Diagnosis not present

## 2015-06-04 DIAGNOSIS — R609 Edema, unspecified: Secondary | ICD-10-CM | POA: Diagnosis not present

## 2015-06-04 DIAGNOSIS — H2513 Age-related nuclear cataract, bilateral: Secondary | ICD-10-CM | POA: Diagnosis not present

## 2015-06-05 DIAGNOSIS — I480 Paroxysmal atrial fibrillation: Secondary | ICD-10-CM | POA: Diagnosis not present

## 2015-06-11 DIAGNOSIS — M79609 Pain in unspecified limb: Secondary | ICD-10-CM | POA: Diagnosis not present

## 2015-06-11 DIAGNOSIS — R609 Edema, unspecified: Secondary | ICD-10-CM | POA: Diagnosis not present

## 2015-06-11 DIAGNOSIS — I70213 Atherosclerosis of native arteries of extremities with intermittent claudication, bilateral legs: Secondary | ICD-10-CM | POA: Diagnosis not present

## 2015-06-11 DIAGNOSIS — I10 Essential (primary) hypertension: Secondary | ICD-10-CM | POA: Diagnosis not present

## 2015-06-11 DIAGNOSIS — M7989 Other specified soft tissue disorders: Secondary | ICD-10-CM | POA: Diagnosis not present

## 2015-06-11 DIAGNOSIS — E785 Hyperlipidemia, unspecified: Secondary | ICD-10-CM | POA: Diagnosis not present

## 2015-06-11 DIAGNOSIS — I2510 Atherosclerotic heart disease of native coronary artery without angina pectoris: Secondary | ICD-10-CM | POA: Diagnosis not present

## 2015-06-11 DIAGNOSIS — I509 Heart failure, unspecified: Secondary | ICD-10-CM | POA: Diagnosis not present

## 2015-06-11 DIAGNOSIS — I890 Lymphedema, not elsewhere classified: Secondary | ICD-10-CM | POA: Diagnosis not present

## 2015-06-11 DIAGNOSIS — N189 Chronic kidney disease, unspecified: Secondary | ICD-10-CM | POA: Diagnosis not present

## 2015-06-17 DIAGNOSIS — M9904 Segmental and somatic dysfunction of sacral region: Secondary | ICD-10-CM | POA: Diagnosis not present

## 2015-06-17 DIAGNOSIS — Q72811 Congenital shortening of right lower limb: Secondary | ICD-10-CM | POA: Diagnosis not present

## 2015-06-17 DIAGNOSIS — M170 Bilateral primary osteoarthritis of knee: Secondary | ICD-10-CM | POA: Diagnosis not present

## 2015-06-17 DIAGNOSIS — M9905 Segmental and somatic dysfunction of pelvic region: Secondary | ICD-10-CM | POA: Diagnosis not present

## 2015-06-17 DIAGNOSIS — M9903 Segmental and somatic dysfunction of lumbar region: Secondary | ICD-10-CM | POA: Diagnosis not present

## 2015-06-17 DIAGNOSIS — M4126 Other idiopathic scoliosis, lumbar region: Secondary | ICD-10-CM | POA: Diagnosis not present

## 2015-06-17 DIAGNOSIS — M25531 Pain in right wrist: Secondary | ICD-10-CM | POA: Diagnosis not present

## 2015-06-17 DIAGNOSIS — M5137 Other intervertebral disc degeneration, lumbosacral region: Secondary | ICD-10-CM | POA: Diagnosis not present

## 2015-06-17 DIAGNOSIS — M25562 Pain in left knee: Secondary | ICD-10-CM | POA: Diagnosis not present

## 2015-06-19 DIAGNOSIS — M170 Bilateral primary osteoarthritis of knee: Secondary | ICD-10-CM | POA: Diagnosis not present

## 2015-06-19 DIAGNOSIS — M25531 Pain in right wrist: Secondary | ICD-10-CM | POA: Diagnosis not present

## 2015-06-19 DIAGNOSIS — M9904 Segmental and somatic dysfunction of sacral region: Secondary | ICD-10-CM | POA: Diagnosis not present

## 2015-06-19 DIAGNOSIS — M5137 Other intervertebral disc degeneration, lumbosacral region: Secondary | ICD-10-CM | POA: Diagnosis not present

## 2015-06-19 DIAGNOSIS — M9903 Segmental and somatic dysfunction of lumbar region: Secondary | ICD-10-CM | POA: Diagnosis not present

## 2015-06-19 DIAGNOSIS — M9905 Segmental and somatic dysfunction of pelvic region: Secondary | ICD-10-CM | POA: Diagnosis not present

## 2015-06-19 DIAGNOSIS — M25562 Pain in left knee: Secondary | ICD-10-CM | POA: Diagnosis not present

## 2015-06-19 DIAGNOSIS — Q72811 Congenital shortening of right lower limb: Secondary | ICD-10-CM | POA: Diagnosis not present

## 2015-06-19 DIAGNOSIS — M4126 Other idiopathic scoliosis, lumbar region: Secondary | ICD-10-CM | POA: Diagnosis not present

## 2015-06-23 DIAGNOSIS — Q72811 Congenital shortening of right lower limb: Secondary | ICD-10-CM | POA: Diagnosis not present

## 2015-06-23 DIAGNOSIS — M4126 Other idiopathic scoliosis, lumbar region: Secondary | ICD-10-CM | POA: Diagnosis not present

## 2015-06-23 DIAGNOSIS — M25562 Pain in left knee: Secondary | ICD-10-CM | POA: Diagnosis not present

## 2015-06-23 DIAGNOSIS — M9904 Segmental and somatic dysfunction of sacral region: Secondary | ICD-10-CM | POA: Diagnosis not present

## 2015-06-23 DIAGNOSIS — M9903 Segmental and somatic dysfunction of lumbar region: Secondary | ICD-10-CM | POA: Diagnosis not present

## 2015-06-23 DIAGNOSIS — M5137 Other intervertebral disc degeneration, lumbosacral region: Secondary | ICD-10-CM | POA: Diagnosis not present

## 2015-06-23 DIAGNOSIS — M170 Bilateral primary osteoarthritis of knee: Secondary | ICD-10-CM | POA: Diagnosis not present

## 2015-06-23 DIAGNOSIS — M9905 Segmental and somatic dysfunction of pelvic region: Secondary | ICD-10-CM | POA: Diagnosis not present

## 2015-06-23 DIAGNOSIS — M25531 Pain in right wrist: Secondary | ICD-10-CM | POA: Diagnosis not present

## 2015-06-24 DIAGNOSIS — M4126 Other idiopathic scoliosis, lumbar region: Secondary | ICD-10-CM | POA: Diagnosis not present

## 2015-06-24 DIAGNOSIS — D472 Monoclonal gammopathy: Secondary | ICD-10-CM | POA: Diagnosis not present

## 2015-06-24 DIAGNOSIS — I10 Essential (primary) hypertension: Secondary | ICD-10-CM | POA: Diagnosis not present

## 2015-06-24 DIAGNOSIS — M5137 Other intervertebral disc degeneration, lumbosacral region: Secondary | ICD-10-CM | POA: Diagnosis not present

## 2015-06-24 DIAGNOSIS — M25562 Pain in left knee: Secondary | ICD-10-CM | POA: Diagnosis not present

## 2015-06-24 DIAGNOSIS — M170 Bilateral primary osteoarthritis of knee: Secondary | ICD-10-CM | POA: Diagnosis not present

## 2015-06-24 DIAGNOSIS — M9903 Segmental and somatic dysfunction of lumbar region: Secondary | ICD-10-CM | POA: Diagnosis not present

## 2015-06-24 DIAGNOSIS — M25531 Pain in right wrist: Secondary | ICD-10-CM | POA: Diagnosis not present

## 2015-06-24 DIAGNOSIS — Q72811 Congenital shortening of right lower limb: Secondary | ICD-10-CM | POA: Diagnosis not present

## 2015-06-24 DIAGNOSIS — N183 Chronic kidney disease, stage 3 (moderate): Secondary | ICD-10-CM | POA: Diagnosis not present

## 2015-06-24 DIAGNOSIS — R600 Localized edema: Secondary | ICD-10-CM | POA: Diagnosis not present

## 2015-06-24 DIAGNOSIS — M9904 Segmental and somatic dysfunction of sacral region: Secondary | ICD-10-CM | POA: Diagnosis not present

## 2015-06-24 DIAGNOSIS — M9905 Segmental and somatic dysfunction of pelvic region: Secondary | ICD-10-CM | POA: Diagnosis not present

## 2015-06-25 DIAGNOSIS — M170 Bilateral primary osteoarthritis of knee: Secondary | ICD-10-CM | POA: Diagnosis not present

## 2015-06-25 DIAGNOSIS — M9905 Segmental and somatic dysfunction of pelvic region: Secondary | ICD-10-CM | POA: Diagnosis not present

## 2015-06-25 DIAGNOSIS — M4126 Other idiopathic scoliosis, lumbar region: Secondary | ICD-10-CM | POA: Diagnosis not present

## 2015-06-25 DIAGNOSIS — M25562 Pain in left knee: Secondary | ICD-10-CM | POA: Diagnosis not present

## 2015-06-25 DIAGNOSIS — M9903 Segmental and somatic dysfunction of lumbar region: Secondary | ICD-10-CM | POA: Diagnosis not present

## 2015-06-25 DIAGNOSIS — M25531 Pain in right wrist: Secondary | ICD-10-CM | POA: Diagnosis not present

## 2015-06-25 DIAGNOSIS — M5137 Other intervertebral disc degeneration, lumbosacral region: Secondary | ICD-10-CM | POA: Diagnosis not present

## 2015-06-25 DIAGNOSIS — Q72811 Congenital shortening of right lower limb: Secondary | ICD-10-CM | POA: Diagnosis not present

## 2015-06-25 DIAGNOSIS — M9904 Segmental and somatic dysfunction of sacral region: Secondary | ICD-10-CM | POA: Diagnosis not present

## 2015-06-30 ENCOUNTER — Encounter: Payer: Self-pay | Admitting: Family Medicine

## 2015-06-30 ENCOUNTER — Ambulatory Visit (INDEPENDENT_AMBULATORY_CARE_PROVIDER_SITE_OTHER): Payer: Medicare Other | Admitting: Family Medicine

## 2015-06-30 VITALS — BP 103/57 | HR 41 | Temp 98.2°F | Wt 175.8 lb

## 2015-06-30 DIAGNOSIS — M9903 Segmental and somatic dysfunction of lumbar region: Secondary | ICD-10-CM | POA: Diagnosis not present

## 2015-06-30 DIAGNOSIS — Z85828 Personal history of other malignant neoplasm of skin: Secondary | ICD-10-CM | POA: Diagnosis not present

## 2015-06-30 DIAGNOSIS — M9905 Segmental and somatic dysfunction of pelvic region: Secondary | ICD-10-CM | POA: Diagnosis not present

## 2015-06-30 DIAGNOSIS — M5137 Other intervertebral disc degeneration, lumbosacral region: Secondary | ICD-10-CM | POA: Diagnosis not present

## 2015-06-30 DIAGNOSIS — R202 Paresthesia of skin: Secondary | ICD-10-CM | POA: Diagnosis not present

## 2015-06-30 DIAGNOSIS — L821 Other seborrheic keratosis: Secondary | ICD-10-CM | POA: Diagnosis not present

## 2015-06-30 DIAGNOSIS — Z954 Presence of other heart-valve replacement: Secondary | ICD-10-CM | POA: Diagnosis not present

## 2015-06-30 DIAGNOSIS — X32XXXA Exposure to sunlight, initial encounter: Secondary | ICD-10-CM | POA: Diagnosis not present

## 2015-06-30 DIAGNOSIS — Z952 Presence of prosthetic heart valve: Secondary | ICD-10-CM

## 2015-06-30 DIAGNOSIS — L570 Actinic keratosis: Secondary | ICD-10-CM | POA: Diagnosis not present

## 2015-06-30 DIAGNOSIS — M25531 Pain in right wrist: Secondary | ICD-10-CM | POA: Diagnosis not present

## 2015-06-30 DIAGNOSIS — M170 Bilateral primary osteoarthritis of knee: Secondary | ICD-10-CM | POA: Diagnosis not present

## 2015-06-30 DIAGNOSIS — Q72811 Congenital shortening of right lower limb: Secondary | ICD-10-CM | POA: Diagnosis not present

## 2015-06-30 DIAGNOSIS — D2272 Melanocytic nevi of left lower limb, including hip: Secondary | ICD-10-CM | POA: Diagnosis not present

## 2015-06-30 DIAGNOSIS — D225 Melanocytic nevi of trunk: Secondary | ICD-10-CM | POA: Diagnosis not present

## 2015-06-30 DIAGNOSIS — M25562 Pain in left knee: Secondary | ICD-10-CM | POA: Diagnosis not present

## 2015-06-30 DIAGNOSIS — M4126 Other idiopathic scoliosis, lumbar region: Secondary | ICD-10-CM | POA: Diagnosis not present

## 2015-06-30 DIAGNOSIS — D485 Neoplasm of uncertain behavior of skin: Secondary | ICD-10-CM | POA: Diagnosis not present

## 2015-06-30 DIAGNOSIS — M9904 Segmental and somatic dysfunction of sacral region: Secondary | ICD-10-CM | POA: Diagnosis not present

## 2015-06-30 LAB — COAGUCHEK XS/INR WAIVED
INR: 4.1 — ABNORMAL HIGH (ref 0.9–1.1)
PROTHROMBIN TIME: 49.5 s

## 2015-06-30 MED ORDER — WARFARIN SODIUM 5 MG PO TABS: 5.0000 mg | ORAL_TABLET | Freq: Every day | ORAL | Status: DC

## 2015-06-30 NOTE — Progress Notes (Signed)
HPI  BP 103/57 mmHg  Pulse 41  Temp(Src) 98.2 F (36.8 C)  Wt 175 lb 12.8 oz (79.742 kg)  SpO2 96%   Subjective:    [name], male    DOB: [birth year], 74 y.o.   MRN: [id number]  CC: Coumadin management  HPI: This patient is a 74 y.o. male who presents for coumadin management. The expected duration of coumadin treatment is lifelong The reason for anticoagulation is  mechanical heart valve.  Present Coumadin dose: '6mg'$  daily Goal: 2.5-3.5  Excessive bruising: no Nose bleeding: no Rectal bleeding: no Prolonged menstrual cycles: N/A Eating diet with consistent amounts of foods containing Vitamin K:no Any recent antibiotic use? no  ROS: Per HPI unless specifically indicated above     Objective:    BP 103/57 mmHg  Pulse 41  Temp(Src) 98.2 F (36.8 C)  Wt 175 lb 12.8 oz (79.742 kg)  SpO2 96%  Wt Readings from Last 3 Encounters:  06/30/15 175 lb 12.8 oz (79.742 kg)  05/28/15 175 lb 6.4 oz (79.561 kg)  05/13/15 171 lb (77.565 kg)     General: Well appearing, well nourished in no distress.  Normal mood and affect. Skin: No excessive bruising or rash  Last INR: 4.1 PT: 49.5    Last CBC:  Lab Results  Component Value Date   WBC 6.9 11/20/2014   HGB 10.2* 11/20/2014   HCT 31.3* 11/20/2014   MCV 106* 11/20/2014   PLT 219 11/20/2014    Results for orders placed or performed in visit on 05/28/15  CoaguChek XS/INR Waived  Result Value Ref Range   INR 3.3 (H) 0.9 - 1.1   Prothrombin Time 39.6 sec       Assessment:     ICD-9-CM ICD-10-CM   1. Mechanical heart valve present V43.3 Z95.4 CoaguChek XS/INR Waived    Plan:   Discussed current plan face-to-face with patient. For coumadin dosing, elected to change dose to 5 mg daily. Will plan to recheck INR in 3 days.   Review of Systems        Physical Exam

## 2015-07-01 DIAGNOSIS — M5137 Other intervertebral disc degeneration, lumbosacral region: Secondary | ICD-10-CM | POA: Diagnosis not present

## 2015-07-01 DIAGNOSIS — M25562 Pain in left knee: Secondary | ICD-10-CM | POA: Diagnosis not present

## 2015-07-01 DIAGNOSIS — M9904 Segmental and somatic dysfunction of sacral region: Secondary | ICD-10-CM | POA: Diagnosis not present

## 2015-07-01 DIAGNOSIS — Q72811 Congenital shortening of right lower limb: Secondary | ICD-10-CM | POA: Diagnosis not present

## 2015-07-01 DIAGNOSIS — M25531 Pain in right wrist: Secondary | ICD-10-CM | POA: Diagnosis not present

## 2015-07-01 DIAGNOSIS — M4126 Other idiopathic scoliosis, lumbar region: Secondary | ICD-10-CM | POA: Diagnosis not present

## 2015-07-01 DIAGNOSIS — M9903 Segmental and somatic dysfunction of lumbar region: Secondary | ICD-10-CM | POA: Diagnosis not present

## 2015-07-01 DIAGNOSIS — M170 Bilateral primary osteoarthritis of knee: Secondary | ICD-10-CM | POA: Diagnosis not present

## 2015-07-01 DIAGNOSIS — M9905 Segmental and somatic dysfunction of pelvic region: Secondary | ICD-10-CM | POA: Diagnosis not present

## 2015-07-02 ENCOUNTER — Ambulatory Visit (INDEPENDENT_AMBULATORY_CARE_PROVIDER_SITE_OTHER): Payer: Medicare Other | Admitting: Family Medicine

## 2015-07-02 ENCOUNTER — Encounter: Payer: Self-pay | Admitting: Family Medicine

## 2015-07-02 VITALS — BP 113/59 | HR 78 | Temp 98.7°F | Wt 175.0 lb

## 2015-07-02 DIAGNOSIS — I10 Essential (primary) hypertension: Secondary | ICD-10-CM | POA: Diagnosis not present

## 2015-07-02 DIAGNOSIS — I482 Chronic atrial fibrillation, unspecified: Secondary | ICD-10-CM

## 2015-07-02 DIAGNOSIS — D472 Monoclonal gammopathy: Secondary | ICD-10-CM | POA: Diagnosis not present

## 2015-07-02 DIAGNOSIS — N183 Chronic kidney disease, stage 3 (moderate): Secondary | ICD-10-CM | POA: Diagnosis not present

## 2015-07-02 DIAGNOSIS — R600 Localized edema: Secondary | ICD-10-CM | POA: Diagnosis not present

## 2015-07-02 LAB — COAGUCHEK XS/INR WAIVED
INR: 2.8 — AB (ref 0.9–1.1)
PROTHROMBIN TIME: 34.1 s

## 2015-07-02 NOTE — Progress Notes (Signed)
   Subjective:    [name], male    DOB: [birth year], 74 y.o.   MRN: [id number]  HPI    BP 113/59 mmHg  Pulse 78  Temp(Src) 98.7 F (37.1 C)  Wt 175 lb (79.379 kg)  SpO2 96%   Subjective:    [name], male    DOB: 04/14/1941, 74 y.o.   MRN: [id number]  CC: Coumadin management  HPI: This patient is a 74 y.o. male who presents for coumadin management. The expected duration of coumadin treatment is lifelong The reason for anticoagulation is  mechanical heart valve.  Present Coumadin dose:'5mg'$  daily, had held for 2 days due to elevated INR Goal: 2.5-3.5  Excessive bruising: no Nose bleeding: no Rectal bleeding: no Prolonged menstrual cycles: N/A Eating diet with consistent amounts of foods containing Vitamin K:yes Any recent antibiotic use? no  ROS: Per HPI unless specifically indicated above     Objective:    BP 113/59 mmHg  Pulse 78  Temp(Src) 98.7 F (37.1 C)  Wt 175 lb (79.379 kg)  SpO2 96%  Wt Readings from Last 3 Encounters:  07/02/15 175 lb (79.379 kg)  06/30/15 175 lb 12.8 oz (79.742 kg)  05/28/15 175 lb 6.4 oz (79.561 kg)     General: Well appearing, well nourished in no distress.  Normal mood and affect. Skin: No excessive bruising or rash  Last INR: 2.8 PT: 34.1    Last CBC:  Lab Results  Component Value Date   WBC 6.9 11/20/2014   HGB 10.2* 11/20/2014   HCT 31.3* 11/20/2014   MCV 106* 11/20/2014   PLT 219 11/20/2014    Results for orders placed or performed in visit on 06/30/15  CoaguChek XS/INR Waived  Result Value Ref Range   INR 4.1 (H) 0.9 - 1.1   Prothrombin Time 49.5 sec       Assessment:     ICD-9-CM ICD-10-CM   1. Chronic atrial fibrillation 427.31 I48.2 CoaguChek XS/INR Waived    Plan:   Discussed current plan face-to-face with patient. For coumadin dosing, elected to continue current dose. Will plan to recheck INR in Monday.   Review of Systems    Physical Exam

## 2015-07-03 DIAGNOSIS — Q72811 Congenital shortening of right lower limb: Secondary | ICD-10-CM | POA: Diagnosis not present

## 2015-07-03 DIAGNOSIS — M170 Bilateral primary osteoarthritis of knee: Secondary | ICD-10-CM | POA: Diagnosis not present

## 2015-07-03 DIAGNOSIS — M5137 Other intervertebral disc degeneration, lumbosacral region: Secondary | ICD-10-CM | POA: Diagnosis not present

## 2015-07-03 DIAGNOSIS — M4126 Other idiopathic scoliosis, lumbar region: Secondary | ICD-10-CM | POA: Diagnosis not present

## 2015-07-03 DIAGNOSIS — M9904 Segmental and somatic dysfunction of sacral region: Secondary | ICD-10-CM | POA: Diagnosis not present

## 2015-07-03 DIAGNOSIS — M25562 Pain in left knee: Secondary | ICD-10-CM | POA: Diagnosis not present

## 2015-07-03 DIAGNOSIS — M9903 Segmental and somatic dysfunction of lumbar region: Secondary | ICD-10-CM | POA: Diagnosis not present

## 2015-07-03 DIAGNOSIS — M9905 Segmental and somatic dysfunction of pelvic region: Secondary | ICD-10-CM | POA: Diagnosis not present

## 2015-07-03 DIAGNOSIS — M25531 Pain in right wrist: Secondary | ICD-10-CM | POA: Diagnosis not present

## 2015-07-07 ENCOUNTER — Ambulatory Visit (INDEPENDENT_AMBULATORY_CARE_PROVIDER_SITE_OTHER): Payer: Medicare Other | Admitting: Family Medicine

## 2015-07-07 ENCOUNTER — Encounter: Payer: Self-pay | Admitting: Family Medicine

## 2015-07-07 VITALS — BP 148/66 | HR 80 | Temp 98.4°F | Wt 174.4 lb

## 2015-07-07 DIAGNOSIS — M9904 Segmental and somatic dysfunction of sacral region: Secondary | ICD-10-CM | POA: Diagnosis not present

## 2015-07-07 DIAGNOSIS — M25531 Pain in right wrist: Secondary | ICD-10-CM | POA: Diagnosis not present

## 2015-07-07 DIAGNOSIS — Q72811 Congenital shortening of right lower limb: Secondary | ICD-10-CM | POA: Diagnosis not present

## 2015-07-07 DIAGNOSIS — Z954 Presence of other heart-valve replacement: Secondary | ICD-10-CM

## 2015-07-07 DIAGNOSIS — M9905 Segmental and somatic dysfunction of pelvic region: Secondary | ICD-10-CM | POA: Diagnosis not present

## 2015-07-07 DIAGNOSIS — I4891 Unspecified atrial fibrillation: Secondary | ICD-10-CM

## 2015-07-07 DIAGNOSIS — M170 Bilateral primary osteoarthritis of knee: Secondary | ICD-10-CM | POA: Diagnosis not present

## 2015-07-07 DIAGNOSIS — M4126 Other idiopathic scoliosis, lumbar region: Secondary | ICD-10-CM | POA: Diagnosis not present

## 2015-07-07 DIAGNOSIS — Z952 Presence of prosthetic heart valve: Secondary | ICD-10-CM

## 2015-07-07 DIAGNOSIS — M9903 Segmental and somatic dysfunction of lumbar region: Secondary | ICD-10-CM | POA: Diagnosis not present

## 2015-07-07 DIAGNOSIS — M5137 Other intervertebral disc degeneration, lumbosacral region: Secondary | ICD-10-CM | POA: Diagnosis not present

## 2015-07-07 DIAGNOSIS — M25562 Pain in left knee: Secondary | ICD-10-CM | POA: Diagnosis not present

## 2015-07-07 LAB — COAGUCHEK XS/INR WAIVED
INR: 3.8 — ABNORMAL HIGH (ref 0.9–1.1)
Prothrombin Time: 45.6 s

## 2015-07-07 NOTE — Progress Notes (Signed)
  BP 148/66 mmHg  Pulse 80  Temp(Src) 98.4 F (36.9 C)  Wt 174 lb 6.4 oz (79.107 kg)  SpO2 97%   Subjective:    [name], male    DOB: 1941-05-26, 74 y.o.   MRN: [id number]  CC: Coumadin management  HPI: This patient is a 74 y.o. male who presents for coumadin management. The expected duration of coumadin treatment is lifelong The reason for anticoagulation is  mechanical heart valve.  Present Coumadin dose: 5 mg daily Goal: 2.5-3.5  Excessive bruising: no Nose bleeding: no Rectal bleeding: no Prolonged menstrual cycles: N/A Eating diet with consistent amounts of foods containing Vitamin K:yes Any recent antibiotic use? no  ROS: Per HPI unless specifically indicated above     Objective:    BP 148/66 mmHg  Pulse 80  Temp(Src) 98.4 F (36.9 C)  Wt 174 lb 6.4 oz (79.107 kg)  SpO2 97%  Wt Readings from Last 3 Encounters:  07/07/15 174 lb 6.4 oz (79.107 kg)  07/02/15 175 lb (79.379 kg)  06/30/15 175 lb 12.8 oz (79.742 kg)     General: Well appearing, well nourished in no distress.  Normal mood and affect. Skin: No excessive bruising or rash  Last INR: 3.8 PT: 45.6    Last CBC:  Lab Results  Component Value Date   WBC 6.9 11/20/2014   HGB 10.2* 11/20/2014   HCT 31.3* 11/20/2014   MCV 106* 11/20/2014   PLT 219 11/20/2014    Results for orders placed or performed in visit on 07/02/15  CoaguChek XS/INR Waived  Result Value Ref Range   INR 2.8 (H) 0.9 - 1.1   Prothrombin Time 34.1 sec       Assessment:     ICD-9-CM ICD-10-CM   1. Mechanical heart valve present V43.3 Z95.4 CoaguChek XS/INR Waived (STAT)  2. Atrial fibrillation, unspecified 427.31 I48.91 CoaguChek XS/INR Waived (STAT)    Plan:   Discussed current plan face-to-face with patient. For coumadin dosing, elected to change dose to 4.5 daily. Will plan to recheck INR in 1 week.

## 2015-07-08 DIAGNOSIS — M9904 Segmental and somatic dysfunction of sacral region: Secondary | ICD-10-CM | POA: Diagnosis not present

## 2015-07-08 DIAGNOSIS — M9903 Segmental and somatic dysfunction of lumbar region: Secondary | ICD-10-CM | POA: Diagnosis not present

## 2015-07-08 DIAGNOSIS — M5137 Other intervertebral disc degeneration, lumbosacral region: Secondary | ICD-10-CM | POA: Diagnosis not present

## 2015-07-08 DIAGNOSIS — M170 Bilateral primary osteoarthritis of knee: Secondary | ICD-10-CM | POA: Diagnosis not present

## 2015-07-08 DIAGNOSIS — M9905 Segmental and somatic dysfunction of pelvic region: Secondary | ICD-10-CM | POA: Diagnosis not present

## 2015-07-08 DIAGNOSIS — M25562 Pain in left knee: Secondary | ICD-10-CM | POA: Diagnosis not present

## 2015-07-08 DIAGNOSIS — M25531 Pain in right wrist: Secondary | ICD-10-CM | POA: Diagnosis not present

## 2015-07-08 DIAGNOSIS — Q72811 Congenital shortening of right lower limb: Secondary | ICD-10-CM | POA: Diagnosis not present

## 2015-07-08 DIAGNOSIS — M4126 Other idiopathic scoliosis, lumbar region: Secondary | ICD-10-CM | POA: Diagnosis not present

## 2015-07-10 DIAGNOSIS — M25562 Pain in left knee: Secondary | ICD-10-CM | POA: Diagnosis not present

## 2015-07-10 DIAGNOSIS — M4126 Other idiopathic scoliosis, lumbar region: Secondary | ICD-10-CM | POA: Diagnosis not present

## 2015-07-10 DIAGNOSIS — M9904 Segmental and somatic dysfunction of sacral region: Secondary | ICD-10-CM | POA: Diagnosis not present

## 2015-07-10 DIAGNOSIS — M25531 Pain in right wrist: Secondary | ICD-10-CM | POA: Diagnosis not present

## 2015-07-10 DIAGNOSIS — M5137 Other intervertebral disc degeneration, lumbosacral region: Secondary | ICD-10-CM | POA: Diagnosis not present

## 2015-07-10 DIAGNOSIS — M9903 Segmental and somatic dysfunction of lumbar region: Secondary | ICD-10-CM | POA: Diagnosis not present

## 2015-07-10 DIAGNOSIS — M9905 Segmental and somatic dysfunction of pelvic region: Secondary | ICD-10-CM | POA: Diagnosis not present

## 2015-07-10 DIAGNOSIS — Q72811 Congenital shortening of right lower limb: Secondary | ICD-10-CM | POA: Diagnosis not present

## 2015-07-10 DIAGNOSIS — M170 Bilateral primary osteoarthritis of knee: Secondary | ICD-10-CM | POA: Diagnosis not present

## 2015-07-14 ENCOUNTER — Encounter: Payer: Self-pay | Admitting: Family Medicine

## 2015-07-14 ENCOUNTER — Ambulatory Visit (INDEPENDENT_AMBULATORY_CARE_PROVIDER_SITE_OTHER): Payer: Medicare Other | Admitting: Family Medicine

## 2015-07-14 VITALS — BP 150/81 | HR 78 | Temp 98.2°F | Wt 173.0 lb

## 2015-07-14 DIAGNOSIS — M9905 Segmental and somatic dysfunction of pelvic region: Secondary | ICD-10-CM | POA: Diagnosis not present

## 2015-07-14 DIAGNOSIS — Q72811 Congenital shortening of right lower limb: Secondary | ICD-10-CM | POA: Diagnosis not present

## 2015-07-14 DIAGNOSIS — I482 Chronic atrial fibrillation, unspecified: Secondary | ICD-10-CM

## 2015-07-14 DIAGNOSIS — M25531 Pain in right wrist: Secondary | ICD-10-CM | POA: Diagnosis not present

## 2015-07-14 DIAGNOSIS — M5137 Other intervertebral disc degeneration, lumbosacral region: Secondary | ICD-10-CM | POA: Diagnosis not present

## 2015-07-14 DIAGNOSIS — M25562 Pain in left knee: Secondary | ICD-10-CM | POA: Diagnosis not present

## 2015-07-14 DIAGNOSIS — M9904 Segmental and somatic dysfunction of sacral region: Secondary | ICD-10-CM | POA: Diagnosis not present

## 2015-07-14 DIAGNOSIS — M9903 Segmental and somatic dysfunction of lumbar region: Secondary | ICD-10-CM | POA: Diagnosis not present

## 2015-07-14 DIAGNOSIS — Z952 Presence of prosthetic heart valve: Secondary | ICD-10-CM | POA: Diagnosis not present

## 2015-07-14 DIAGNOSIS — M4126 Other idiopathic scoliosis, lumbar region: Secondary | ICD-10-CM | POA: Diagnosis not present

## 2015-07-14 DIAGNOSIS — M170 Bilateral primary osteoarthritis of knee: Secondary | ICD-10-CM | POA: Diagnosis not present

## 2015-07-14 LAB — COAGUCHEK XS/INR WAIVED
INR: 4.4 — ABNORMAL HIGH (ref 0.9–1.1)
PROTHROMBIN TIME: 52.3 s

## 2015-07-14 MED ORDER — WARFARIN SODIUM 4 MG PO TABS: 4.0000 mg | ORAL_TABLET | Freq: Every day | ORAL | Status: DC

## 2015-07-14 NOTE — Progress Notes (Signed)
   Subjective:    [name], male    DOB: 05-Apr-1941, 74 y.o.   MRN: [id number]  HPI    Review of Systems     Objective:   Physical Exam        Assessment & Plan:

## 2015-07-14 NOTE — Progress Notes (Signed)
  BP 150/81 mmHg  Pulse 78  Temp(Src) 98.2 F (36.8 C)  Wt 173 lb (78.472 kg)  SpO2 97%   Subjective:    [name], male    DOB: Apr 17, 1941, 74 y.o.   MRN: [id number]  CC: Coumadin management  HPI: This patient is a 74 y.o. male who presents for coumadin management. The expected duration of coumadin treatment is lifelong The reason for anticoagulation is  mechanical heart valve.  Present Coumadin dose: didn't get his 4s, so stayed on '5mg'$  daily Goal: 2.5-3.5  Excessive bruising: no Nose bleeding: no Rectal bleeding: no Prolonged menstrual cycles: N/A Eating diet with consistent amounts of foods containing Vitamin K:yes Any recent antibiotic use? no  ROS: Per HPI unless specifically indicated above     Objective:    BP 150/81 mmHg  Pulse 78  Temp(Src) 98.2 F (36.8 C)  Wt 173 lb (78.472 kg)  SpO2 97%  Wt Readings from Last 3 Encounters:  07/14/15 173 lb (78.472 kg)  07/07/15 174 lb 6.4 oz (79.107 kg)  07/02/15 175 lb (79.379 kg)     General: Well appearing, well nourished in no distress.  Normal mood and affect. Skin: No excessive bruising or rash  Last INR: 4.4 PT: 52.3    Last CBC:  Lab Results  Component Value Date   WBC 6.9 11/20/2014   HGB 10.2* 11/20/2014   HCT 31.3* 11/20/2014   MCV 106* 11/20/2014   PLT 219 11/20/2014    Results for orders placed or performed in visit on 07/07/15  CoaguChek XS/INR Waived (STAT)  Result Value Ref Range   INR 3.8 (H) 0.9 - 1.1   Prothrombin Time 45.6 sec       Assessment:     ICD-9-CM ICD-10-CM   1. History of prosthetic heart valve V43.3 Z95.2 CoaguChek XS/INR Waived  2. Chronic atrial fibrillation 427.31 I48.2 CoaguChek XS/INR Waived     CoaguChek XS/INR Waived    Plan:   Discussed current plan face-to-face with patient. For coumadin dosing, elected to decrease dose to 4.'5mg'$  daily. Will plan to recheck INR in 1 week.

## 2015-07-15 DIAGNOSIS — M25531 Pain in right wrist: Secondary | ICD-10-CM | POA: Diagnosis not present

## 2015-07-15 DIAGNOSIS — M4126 Other idiopathic scoliosis, lumbar region: Secondary | ICD-10-CM | POA: Diagnosis not present

## 2015-07-15 DIAGNOSIS — Q72811 Congenital shortening of right lower limb: Secondary | ICD-10-CM | POA: Diagnosis not present

## 2015-07-15 DIAGNOSIS — M9903 Segmental and somatic dysfunction of lumbar region: Secondary | ICD-10-CM | POA: Diagnosis not present

## 2015-07-15 DIAGNOSIS — M9905 Segmental and somatic dysfunction of pelvic region: Secondary | ICD-10-CM | POA: Diagnosis not present

## 2015-07-15 DIAGNOSIS — M170 Bilateral primary osteoarthritis of knee: Secondary | ICD-10-CM | POA: Diagnosis not present

## 2015-07-15 DIAGNOSIS — M5137 Other intervertebral disc degeneration, lumbosacral region: Secondary | ICD-10-CM | POA: Diagnosis not present

## 2015-07-15 DIAGNOSIS — M25562 Pain in left knee: Secondary | ICD-10-CM | POA: Diagnosis not present

## 2015-07-15 DIAGNOSIS — M9904 Segmental and somatic dysfunction of sacral region: Secondary | ICD-10-CM | POA: Diagnosis not present

## 2015-07-16 DIAGNOSIS — Q72811 Congenital shortening of right lower limb: Secondary | ICD-10-CM | POA: Diagnosis not present

## 2015-07-16 DIAGNOSIS — M9904 Segmental and somatic dysfunction of sacral region: Secondary | ICD-10-CM | POA: Diagnosis not present

## 2015-07-16 DIAGNOSIS — M9905 Segmental and somatic dysfunction of pelvic region: Secondary | ICD-10-CM | POA: Diagnosis not present

## 2015-07-16 DIAGNOSIS — M5137 Other intervertebral disc degeneration, lumbosacral region: Secondary | ICD-10-CM | POA: Diagnosis not present

## 2015-07-16 DIAGNOSIS — M9903 Segmental and somatic dysfunction of lumbar region: Secondary | ICD-10-CM | POA: Diagnosis not present

## 2015-07-16 DIAGNOSIS — M25531 Pain in right wrist: Secondary | ICD-10-CM | POA: Diagnosis not present

## 2015-07-16 DIAGNOSIS — M4126 Other idiopathic scoliosis, lumbar region: Secondary | ICD-10-CM | POA: Diagnosis not present

## 2015-07-16 DIAGNOSIS — M170 Bilateral primary osteoarthritis of knee: Secondary | ICD-10-CM | POA: Diagnosis not present

## 2015-07-16 DIAGNOSIS — M25562 Pain in left knee: Secondary | ICD-10-CM | POA: Diagnosis not present

## 2015-07-21 ENCOUNTER — Ambulatory Visit: Payer: Medicare Other | Admitting: Family Medicine

## 2015-07-23 DIAGNOSIS — M25562 Pain in left knee: Secondary | ICD-10-CM | POA: Diagnosis not present

## 2015-07-23 DIAGNOSIS — M9903 Segmental and somatic dysfunction of lumbar region: Secondary | ICD-10-CM | POA: Diagnosis not present

## 2015-07-23 DIAGNOSIS — M9904 Segmental and somatic dysfunction of sacral region: Secondary | ICD-10-CM | POA: Diagnosis not present

## 2015-07-23 DIAGNOSIS — M25531 Pain in right wrist: Secondary | ICD-10-CM | POA: Diagnosis not present

## 2015-07-23 DIAGNOSIS — Q72811 Congenital shortening of right lower limb: Secondary | ICD-10-CM | POA: Diagnosis not present

## 2015-07-23 DIAGNOSIS — M4126 Other idiopathic scoliosis, lumbar region: Secondary | ICD-10-CM | POA: Diagnosis not present

## 2015-07-23 DIAGNOSIS — M9905 Segmental and somatic dysfunction of pelvic region: Secondary | ICD-10-CM | POA: Diagnosis not present

## 2015-07-23 DIAGNOSIS — M170 Bilateral primary osteoarthritis of knee: Secondary | ICD-10-CM | POA: Diagnosis not present

## 2015-07-23 DIAGNOSIS — M5137 Other intervertebral disc degeneration, lumbosacral region: Secondary | ICD-10-CM | POA: Diagnosis not present

## 2015-07-24 ENCOUNTER — Encounter: Payer: Self-pay | Admitting: Family Medicine

## 2015-07-24 ENCOUNTER — Ambulatory Visit (INDEPENDENT_AMBULATORY_CARE_PROVIDER_SITE_OTHER): Payer: Medicare Other | Admitting: Family Medicine

## 2015-07-24 DIAGNOSIS — Z952 Presence of prosthetic heart valve: Secondary | ICD-10-CM | POA: Diagnosis not present

## 2015-07-24 DIAGNOSIS — I482 Chronic atrial fibrillation, unspecified: Secondary | ICD-10-CM

## 2015-07-24 DIAGNOSIS — Q72811 Congenital shortening of right lower limb: Secondary | ICD-10-CM | POA: Diagnosis not present

## 2015-07-24 DIAGNOSIS — M9903 Segmental and somatic dysfunction of lumbar region: Secondary | ICD-10-CM | POA: Diagnosis not present

## 2015-07-24 DIAGNOSIS — M9905 Segmental and somatic dysfunction of pelvic region: Secondary | ICD-10-CM | POA: Diagnosis not present

## 2015-07-24 DIAGNOSIS — M170 Bilateral primary osteoarthritis of knee: Secondary | ICD-10-CM | POA: Diagnosis not present

## 2015-07-24 DIAGNOSIS — M9904 Segmental and somatic dysfunction of sacral region: Secondary | ICD-10-CM | POA: Diagnosis not present

## 2015-07-24 DIAGNOSIS — M25531 Pain in right wrist: Secondary | ICD-10-CM | POA: Diagnosis not present

## 2015-07-24 DIAGNOSIS — M4126 Other idiopathic scoliosis, lumbar region: Secondary | ICD-10-CM | POA: Diagnosis not present

## 2015-07-24 DIAGNOSIS — M5137 Other intervertebral disc degeneration, lumbosacral region: Secondary | ICD-10-CM | POA: Diagnosis not present

## 2015-07-24 DIAGNOSIS — M25562 Pain in left knee: Secondary | ICD-10-CM | POA: Diagnosis not present

## 2015-07-24 LAB — COAGUCHEK XS/INR WAIVED
INR: 2.1 — AB (ref 0.9–1.1)
Prothrombin Time: 24.7 s

## 2015-07-24 NOTE — Progress Notes (Signed)
   Subjective:    [name], male    DOB: March 03, 1941, 74 y.o.   MRN: [id number]    BP 155/75 mmHg  Pulse 84  Temp(Src) 98.4 F (36.9 C)  Wt 175 lb (79.379 kg)  SpO2 96%   Subjective:    [name], male    DOB: 1941/07/30, 74 y.o.   MRN: [id number]  CC: Coumadin management  HPI: This patient is a 74 y.o. male who presents for coumadin management. The expected duration of coumadin treatment is lifelong The reason for anticoagulation is  mechanical heart valve.  Present Coumadin dose: 3 days of '5mg'$ , 4 days of '4mg'$  Goal: 2.5-3.5  Excessive bruising: yes Nose bleeding: yes Rectal bleeding: yes Prolonged menstrual cycles: N/A Eating diet with consistent amounts of foods containing Vitamin K:no Any recent antibiotic use? no  ROS: Per HPI unless specifically indicated above     Objective:    BP 155/75 mmHg  Pulse 84  Temp(Src) 98.4 F (36.9 C)  Wt 175 lb (79.379 kg)  SpO2 96%  Wt Readings from Last 3 Encounters:  07/24/15 175 lb (79.379 kg)  07/14/15 173 lb (78.472 kg)  07/07/15 174 lb 6.4 oz (79.107 kg)     General: Well appearing, well nourished in no distress.  Normal mood and affect. Skin: No excessive bruising or rash  Last INR: 2.1 Last PT: 24.7    Last CBC:  Lab Results  Component Value Date   WBC 6.9 11/20/2014   HGB 10.2* 11/20/2014   HCT 31.3* 11/20/2014   MCV 106* 11/20/2014   PLT 219 11/20/2014    Results for orders placed or performed in visit on 07/14/15  CoaguChek XS/INR Waived  Result Value Ref Range   INR 4.4 (H) 0.9 - 1.1   Prothrombin Time 52.3 sec       Assessment:     ICD-9-CM ICD-10-CM   1. Chronic atrial fibrillation 427.31 I48.2 CoaguChek XS/INR Waived  2. History of prosthetic heart valve V43.3 Z95.2 CoaguChek XS/INR Waived    Plan:   Discussed current plan face-to-face with patient. For coumadin dosing, elected to change dose to 4 days of '5mg'$  and 3 days of '4mg'$ . Will plan to recheck  INR in 1 week.  HPI    Review of Systems    Physical Exam

## 2015-07-25 DIAGNOSIS — M4126 Other idiopathic scoliosis, lumbar region: Secondary | ICD-10-CM | POA: Diagnosis not present

## 2015-07-25 DIAGNOSIS — M170 Bilateral primary osteoarthritis of knee: Secondary | ICD-10-CM | POA: Diagnosis not present

## 2015-07-25 DIAGNOSIS — M9905 Segmental and somatic dysfunction of pelvic region: Secondary | ICD-10-CM | POA: Diagnosis not present

## 2015-07-25 DIAGNOSIS — M9904 Segmental and somatic dysfunction of sacral region: Secondary | ICD-10-CM | POA: Diagnosis not present

## 2015-07-25 DIAGNOSIS — M25531 Pain in right wrist: Secondary | ICD-10-CM | POA: Diagnosis not present

## 2015-07-25 DIAGNOSIS — M5137 Other intervertebral disc degeneration, lumbosacral region: Secondary | ICD-10-CM | POA: Diagnosis not present

## 2015-07-25 DIAGNOSIS — Q72811 Congenital shortening of right lower limb: Secondary | ICD-10-CM | POA: Diagnosis not present

## 2015-07-25 DIAGNOSIS — M9903 Segmental and somatic dysfunction of lumbar region: Secondary | ICD-10-CM | POA: Diagnosis not present

## 2015-07-25 DIAGNOSIS — M25562 Pain in left knee: Secondary | ICD-10-CM | POA: Diagnosis not present

## 2015-07-28 DIAGNOSIS — M4126 Other idiopathic scoliosis, lumbar region: Secondary | ICD-10-CM | POA: Diagnosis not present

## 2015-07-28 DIAGNOSIS — M5137 Other intervertebral disc degeneration, lumbosacral region: Secondary | ICD-10-CM | POA: Diagnosis not present

## 2015-07-28 DIAGNOSIS — M25562 Pain in left knee: Secondary | ICD-10-CM | POA: Diagnosis not present

## 2015-07-28 DIAGNOSIS — M9905 Segmental and somatic dysfunction of pelvic region: Secondary | ICD-10-CM | POA: Diagnosis not present

## 2015-07-28 DIAGNOSIS — M25531 Pain in right wrist: Secondary | ICD-10-CM | POA: Diagnosis not present

## 2015-07-28 DIAGNOSIS — M9903 Segmental and somatic dysfunction of lumbar region: Secondary | ICD-10-CM | POA: Diagnosis not present

## 2015-07-28 DIAGNOSIS — M170 Bilateral primary osteoarthritis of knee: Secondary | ICD-10-CM | POA: Diagnosis not present

## 2015-07-28 DIAGNOSIS — M9904 Segmental and somatic dysfunction of sacral region: Secondary | ICD-10-CM | POA: Diagnosis not present

## 2015-07-28 DIAGNOSIS — Q72811 Congenital shortening of right lower limb: Secondary | ICD-10-CM | POA: Diagnosis not present

## 2015-07-29 DIAGNOSIS — Q72811 Congenital shortening of right lower limb: Secondary | ICD-10-CM | POA: Diagnosis not present

## 2015-07-29 DIAGNOSIS — M4126 Other idiopathic scoliosis, lumbar region: Secondary | ICD-10-CM | POA: Diagnosis not present

## 2015-07-29 DIAGNOSIS — M9904 Segmental and somatic dysfunction of sacral region: Secondary | ICD-10-CM | POA: Diagnosis not present

## 2015-07-29 DIAGNOSIS — M9905 Segmental and somatic dysfunction of pelvic region: Secondary | ICD-10-CM | POA: Diagnosis not present

## 2015-07-29 DIAGNOSIS — M25531 Pain in right wrist: Secondary | ICD-10-CM | POA: Diagnosis not present

## 2015-07-29 DIAGNOSIS — M170 Bilateral primary osteoarthritis of knee: Secondary | ICD-10-CM | POA: Diagnosis not present

## 2015-07-29 DIAGNOSIS — M5137 Other intervertebral disc degeneration, lumbosacral region: Secondary | ICD-10-CM | POA: Diagnosis not present

## 2015-07-29 DIAGNOSIS — M25562 Pain in left knee: Secondary | ICD-10-CM | POA: Diagnosis not present

## 2015-07-29 DIAGNOSIS — M9903 Segmental and somatic dysfunction of lumbar region: Secondary | ICD-10-CM | POA: Diagnosis not present

## 2015-07-31 ENCOUNTER — Encounter: Payer: Self-pay | Admitting: Family Medicine

## 2015-07-31 ENCOUNTER — Ambulatory Visit (INDEPENDENT_AMBULATORY_CARE_PROVIDER_SITE_OTHER): Payer: Medicare Other | Admitting: Family Medicine

## 2015-07-31 VITALS — BP 142/65 | HR 77 | Temp 98.7°F | Wt 174.0 lb

## 2015-07-31 DIAGNOSIS — M4126 Other idiopathic scoliosis, lumbar region: Secondary | ICD-10-CM | POA: Diagnosis not present

## 2015-07-31 DIAGNOSIS — I482 Chronic atrial fibrillation, unspecified: Secondary | ICD-10-CM

## 2015-07-31 DIAGNOSIS — Q72811 Congenital shortening of right lower limb: Secondary | ICD-10-CM | POA: Diagnosis not present

## 2015-07-31 DIAGNOSIS — M170 Bilateral primary osteoarthritis of knee: Secondary | ICD-10-CM | POA: Diagnosis not present

## 2015-07-31 DIAGNOSIS — M9904 Segmental and somatic dysfunction of sacral region: Secondary | ICD-10-CM | POA: Diagnosis not present

## 2015-07-31 DIAGNOSIS — Z952 Presence of prosthetic heart valve: Secondary | ICD-10-CM

## 2015-07-31 DIAGNOSIS — M9905 Segmental and somatic dysfunction of pelvic region: Secondary | ICD-10-CM | POA: Diagnosis not present

## 2015-07-31 DIAGNOSIS — M5137 Other intervertebral disc degeneration, lumbosacral region: Secondary | ICD-10-CM | POA: Diagnosis not present

## 2015-07-31 DIAGNOSIS — M25562 Pain in left knee: Secondary | ICD-10-CM | POA: Diagnosis not present

## 2015-07-31 DIAGNOSIS — M25531 Pain in right wrist: Secondary | ICD-10-CM | POA: Diagnosis not present

## 2015-07-31 DIAGNOSIS — M9903 Segmental and somatic dysfunction of lumbar region: Secondary | ICD-10-CM | POA: Diagnosis not present

## 2015-07-31 LAB — COAGUCHEK XS/INR WAIVED
INR: 3.6 — ABNORMAL HIGH (ref 0.9–1.1)
Prothrombin Time: 43.7 s

## 2015-07-31 NOTE — Progress Notes (Signed)
   BP 142/65 mmHg  Pulse 77  Temp(Src) 98.7 F (37.1 C)  Wt 174 lb (78.926 kg)  SpO2 99%   Subjective:    [name], male    DOB: 03/07/1941, 74 y.o.   MRN: [id number]  HPI: Kirk Jones is a 74 y.o. male  Chief Complaint  Patient presents with  . Anticoagulation   Patient taking warfarin 4 mg 1 day 5 mg in next doing well no complaints or problems. No unusual bruising or bleeding symptoms Patient's arthritis in his back is doing better has been going to a chiropractor.  Relevant past medical, surgical, family and social history reviewed and updated as indicated. Interim medical history since our last visit reviewed. Allergies and medications reviewed and updated.  Review of Systems  Per HPI unless specifically indicated above     Objective:    BP 142/65 mmHg  Pulse 77  Temp(Src) 98.7 F (37.1 C)  Wt 174 lb (78.926 kg)  SpO2 99%  Wt Readings from Last 3 Encounters:  07/31/15 174 lb (78.926 kg)  07/24/15 175 lb (79.379 kg)  07/14/15 173 lb (78.472 kg)    Physical Exam  Results for orders placed or performed in visit on 07/24/15  CoaguChek XS/INR Waived  Result Value Ref Range   INR 2.1 (H) 0.9 - 1.1   Prothrombin Time 24.7 sec      Assessment & Plan:   Problem List Items Addressed This Visit      Cardiovascular and Mediastinum   Atrial fibrillation - Primary (Chronic)   Relevant Orders   CoaguChek XS/INR Waived       Follow up plan: Return in about 4 weeks (around 08/28/2015), or if symptoms worsen or fail to improve, for Repeat INR 1 months.

## 2015-07-31 NOTE — Assessment & Plan Note (Signed)
INR today of 3.6 will continue current dosing of medications patient will increase his green foods slightly

## 2015-08-05 DIAGNOSIS — M25531 Pain in right wrist: Secondary | ICD-10-CM | POA: Diagnosis not present

## 2015-08-05 DIAGNOSIS — Q72811 Congenital shortening of right lower limb: Secondary | ICD-10-CM | POA: Diagnosis not present

## 2015-08-05 DIAGNOSIS — M170 Bilateral primary osteoarthritis of knee: Secondary | ICD-10-CM | POA: Diagnosis not present

## 2015-08-05 DIAGNOSIS — M25562 Pain in left knee: Secondary | ICD-10-CM | POA: Diagnosis not present

## 2015-08-05 DIAGNOSIS — M9905 Segmental and somatic dysfunction of pelvic region: Secondary | ICD-10-CM | POA: Diagnosis not present

## 2015-08-05 DIAGNOSIS — M5137 Other intervertebral disc degeneration, lumbosacral region: Secondary | ICD-10-CM | POA: Diagnosis not present

## 2015-08-05 DIAGNOSIS — M9904 Segmental and somatic dysfunction of sacral region: Secondary | ICD-10-CM | POA: Diagnosis not present

## 2015-08-05 DIAGNOSIS — M9903 Segmental and somatic dysfunction of lumbar region: Secondary | ICD-10-CM | POA: Diagnosis not present

## 2015-08-05 DIAGNOSIS — M4126 Other idiopathic scoliosis, lumbar region: Secondary | ICD-10-CM | POA: Diagnosis not present

## 2015-08-07 DIAGNOSIS — Q72811 Congenital shortening of right lower limb: Secondary | ICD-10-CM | POA: Diagnosis not present

## 2015-08-07 DIAGNOSIS — M25562 Pain in left knee: Secondary | ICD-10-CM | POA: Diagnosis not present

## 2015-08-07 DIAGNOSIS — M9903 Segmental and somatic dysfunction of lumbar region: Secondary | ICD-10-CM | POA: Diagnosis not present

## 2015-08-07 DIAGNOSIS — M9905 Segmental and somatic dysfunction of pelvic region: Secondary | ICD-10-CM | POA: Diagnosis not present

## 2015-08-07 DIAGNOSIS — M5137 Other intervertebral disc degeneration, lumbosacral region: Secondary | ICD-10-CM | POA: Diagnosis not present

## 2015-08-07 DIAGNOSIS — M25531 Pain in right wrist: Secondary | ICD-10-CM | POA: Diagnosis not present

## 2015-08-07 DIAGNOSIS — M170 Bilateral primary osteoarthritis of knee: Secondary | ICD-10-CM | POA: Diagnosis not present

## 2015-08-07 DIAGNOSIS — M4126 Other idiopathic scoliosis, lumbar region: Secondary | ICD-10-CM | POA: Diagnosis not present

## 2015-08-07 DIAGNOSIS — M9904 Segmental and somatic dysfunction of sacral region: Secondary | ICD-10-CM | POA: Diagnosis not present

## 2015-08-11 DIAGNOSIS — M25531 Pain in right wrist: Secondary | ICD-10-CM | POA: Diagnosis not present

## 2015-08-11 DIAGNOSIS — Q72811 Congenital shortening of right lower limb: Secondary | ICD-10-CM | POA: Diagnosis not present

## 2015-08-11 DIAGNOSIS — M25562 Pain in left knee: Secondary | ICD-10-CM | POA: Diagnosis not present

## 2015-08-11 DIAGNOSIS — M4126 Other idiopathic scoliosis, lumbar region: Secondary | ICD-10-CM | POA: Diagnosis not present

## 2015-08-11 DIAGNOSIS — M5137 Other intervertebral disc degeneration, lumbosacral region: Secondary | ICD-10-CM | POA: Diagnosis not present

## 2015-08-11 DIAGNOSIS — M170 Bilateral primary osteoarthritis of knee: Secondary | ICD-10-CM | POA: Diagnosis not present

## 2015-08-11 DIAGNOSIS — M9904 Segmental and somatic dysfunction of sacral region: Secondary | ICD-10-CM | POA: Diagnosis not present

## 2015-08-11 DIAGNOSIS — M9903 Segmental and somatic dysfunction of lumbar region: Secondary | ICD-10-CM | POA: Diagnosis not present

## 2015-08-11 DIAGNOSIS — M9905 Segmental and somatic dysfunction of pelvic region: Secondary | ICD-10-CM | POA: Diagnosis not present

## 2015-08-13 DIAGNOSIS — M9904 Segmental and somatic dysfunction of sacral region: Secondary | ICD-10-CM | POA: Diagnosis not present

## 2015-08-13 DIAGNOSIS — M170 Bilateral primary osteoarthritis of knee: Secondary | ICD-10-CM | POA: Diagnosis not present

## 2015-08-13 DIAGNOSIS — M9903 Segmental and somatic dysfunction of lumbar region: Secondary | ICD-10-CM | POA: Diagnosis not present

## 2015-08-13 DIAGNOSIS — Q72811 Congenital shortening of right lower limb: Secondary | ICD-10-CM | POA: Diagnosis not present

## 2015-08-13 DIAGNOSIS — M25531 Pain in right wrist: Secondary | ICD-10-CM | POA: Diagnosis not present

## 2015-08-13 DIAGNOSIS — M9905 Segmental and somatic dysfunction of pelvic region: Secondary | ICD-10-CM | POA: Diagnosis not present

## 2015-08-13 DIAGNOSIS — M25562 Pain in left knee: Secondary | ICD-10-CM | POA: Diagnosis not present

## 2015-08-13 DIAGNOSIS — M4126 Other idiopathic scoliosis, lumbar region: Secondary | ICD-10-CM | POA: Diagnosis not present

## 2015-08-13 DIAGNOSIS — M5137 Other intervertebral disc degeneration, lumbosacral region: Secondary | ICD-10-CM | POA: Diagnosis not present

## 2015-08-28 DIAGNOSIS — M9904 Segmental and somatic dysfunction of sacral region: Secondary | ICD-10-CM | POA: Diagnosis not present

## 2015-08-28 DIAGNOSIS — M5137 Other intervertebral disc degeneration, lumbosacral region: Secondary | ICD-10-CM | POA: Diagnosis not present

## 2015-08-28 DIAGNOSIS — M9903 Segmental and somatic dysfunction of lumbar region: Secondary | ICD-10-CM | POA: Diagnosis not present

## 2015-08-28 DIAGNOSIS — M25562 Pain in left knee: Secondary | ICD-10-CM | POA: Diagnosis not present

## 2015-08-28 DIAGNOSIS — M4126 Other idiopathic scoliosis, lumbar region: Secondary | ICD-10-CM | POA: Diagnosis not present

## 2015-08-28 DIAGNOSIS — M9905 Segmental and somatic dysfunction of pelvic region: Secondary | ICD-10-CM | POA: Diagnosis not present

## 2015-08-28 DIAGNOSIS — M25531 Pain in right wrist: Secondary | ICD-10-CM | POA: Diagnosis not present

## 2015-08-28 DIAGNOSIS — Q72811 Congenital shortening of right lower limb: Secondary | ICD-10-CM | POA: Diagnosis not present

## 2015-08-28 DIAGNOSIS — M170 Bilateral primary osteoarthritis of knee: Secondary | ICD-10-CM | POA: Diagnosis not present

## 2015-09-02 ENCOUNTER — Encounter: Payer: Self-pay | Admitting: Family Medicine

## 2015-09-02 ENCOUNTER — Ambulatory Visit (INDEPENDENT_AMBULATORY_CARE_PROVIDER_SITE_OTHER): Payer: Medicare Other | Admitting: Family Medicine

## 2015-09-02 ENCOUNTER — Other Ambulatory Visit: Payer: Medicare Other

## 2015-09-02 VITALS — BP 120/73 | HR 84 | Temp 98.5°F | Wt 171.0 lb

## 2015-09-02 DIAGNOSIS — Z23 Encounter for immunization: Secondary | ICD-10-CM

## 2015-09-02 DIAGNOSIS — I482 Chronic atrial fibrillation, unspecified: Secondary | ICD-10-CM

## 2015-09-02 DIAGNOSIS — Z952 Presence of prosthetic heart valve: Secondary | ICD-10-CM | POA: Diagnosis not present

## 2015-09-02 LAB — COAGUCHEK XS/INR WAIVED
INR: 3.4 — ABNORMAL HIGH (ref 0.9–1.1)
Prothrombin Time: 41 s

## 2015-09-02 NOTE — Assessment & Plan Note (Signed)
The current medical regimen is effective;  continue present plan and medications.  

## 2015-09-02 NOTE — Progress Notes (Signed)
   BP 120/73 mmHg  Pulse 84  Temp(Src) 98.5 F (36.9 C)  Wt 171 lb (77.565 kg)  SpO2 99%   Subjective:    [name], male    DOB: 14-May-1941, 74 y.o.   MRN: [id number]  HPI: Kirk Jones is a 74 y.o. male  Chief Complaint  Patient presents with  . Anticoagulation   Patient follow-up doing well no bruising no complaints from medications takes warfarin faithfully Relevant past medical, surgical, family and social history reviewed and updated as indicated. Interim medical history since our last visit reviewed. Allergies and medications reviewed and updated.  Review of Systems  Per HPI unless specifically indicated above     Objective:    BP 120/73 mmHg  Pulse 84  Temp(Src) 98.5 F (36.9 C)  Wt 171 lb (77.565 kg)  SpO2 99%  Wt Readings from Last 3 Encounters:  09/02/15 171 lb (77.565 kg)  07/31/15 174 lb (78.926 kg)  07/24/15 175 lb (79.379 kg)    Physical Exam  Results for orders placed or performed in visit on 09/02/15  CoaguChek XS/INR Waived  Result Value Ref Range   INR 3.4 (H) 0.9 - 1.1   Prothrombin Time 41.0 sec      Assessment & Plan:   Problem List Items Addressed This Visit      Cardiovascular and Mediastinum   Atrial fibrillation ([place]) - Primary (Chronic)    The current medical regimen is effective;  continue present plan and medications.       Relevant Orders   CoaguChek XS/INR Waived (Completed)   Type and screen     Other   History of prosthetic heart valve (Chronic)    The current medical regimen is effective;  continue present plan and medications.        Other Visit Diagnoses    Immunization due        Relevant Orders    Flu Vaccine QUAD 36+ mos PF IM (Fluarix & Fluzone Quad PF) (Completed)        Follow up plan: Return in about 4 weeks (around 09/30/2015) for PT INR, Physical Exam.

## 2015-09-03 ENCOUNTER — Encounter: Payer: Self-pay | Admitting: Family Medicine

## 2015-09-03 LAB — ABO/RH: Rh Factor: POSITIVE

## 2015-09-04 DIAGNOSIS — I472 Ventricular tachycardia: Secondary | ICD-10-CM | POA: Diagnosis not present

## 2015-09-04 DIAGNOSIS — N183 Chronic kidney disease, stage 3 (moderate): Secondary | ICD-10-CM | POA: Diagnosis not present

## 2015-09-04 DIAGNOSIS — Z8673 Personal history of transient ischemic attack (TIA), and cerebral infarction without residual deficits: Secondary | ICD-10-CM | POA: Diagnosis not present

## 2015-09-04 DIAGNOSIS — I5032 Chronic diastolic (congestive) heart failure: Secondary | ICD-10-CM | POA: Diagnosis not present

## 2015-09-04 DIAGNOSIS — Z9861 Coronary angioplasty status: Secondary | ICD-10-CM | POA: Diagnosis not present

## 2015-09-04 DIAGNOSIS — I255 Ischemic cardiomyopathy: Secondary | ICD-10-CM | POA: Diagnosis not present

## 2015-09-04 DIAGNOSIS — I129 Hypertensive chronic kidney disease with stage 1 through stage 4 chronic kidney disease, or unspecified chronic kidney disease: Secondary | ICD-10-CM | POA: Diagnosis not present

## 2015-09-04 DIAGNOSIS — I481 Persistent atrial fibrillation: Secondary | ICD-10-CM | POA: Diagnosis not present

## 2015-09-04 DIAGNOSIS — I10 Essential (primary) hypertension: Secondary | ICD-10-CM | POA: Diagnosis not present

## 2015-09-04 DIAGNOSIS — I2510 Atherosclerotic heart disease of native coronary artery without angina pectoris: Secondary | ICD-10-CM | POA: Diagnosis not present

## 2015-09-04 DIAGNOSIS — Z952 Presence of prosthetic heart valve: Secondary | ICD-10-CM | POA: Diagnosis not present

## 2015-09-04 DIAGNOSIS — Z7901 Long term (current) use of anticoagulants: Secondary | ICD-10-CM | POA: Diagnosis not present

## 2015-09-04 DIAGNOSIS — R600 Localized edema: Secondary | ICD-10-CM | POA: Diagnosis not present

## 2015-09-04 DIAGNOSIS — I480 Paroxysmal atrial fibrillation: Secondary | ICD-10-CM | POA: Diagnosis not present

## 2015-09-04 DIAGNOSIS — Z95810 Presence of automatic (implantable) cardiac defibrillator: Secondary | ICD-10-CM | POA: Diagnosis not present

## 2015-09-04 DIAGNOSIS — E7800 Pure hypercholesterolemia, unspecified: Secondary | ICD-10-CM | POA: Diagnosis not present

## 2015-09-05 ENCOUNTER — Other Ambulatory Visit: Payer: Self-pay

## 2015-09-05 MED ORDER — WARFARIN SODIUM 5 MG PO TABS: 5.0000 mg | ORAL_TABLET | Freq: Every day | ORAL | Status: DC

## 2015-09-05 NOTE — Telephone Encounter (Signed)
PATIENTMAHKAI Jones DOB: October 29, 1941 MRN: [id number]  Patient request refill for warfarin sodium 5 mg tab # 30. Pharmacist noted on request form "Patient states he is on both 4 mg and 5 mg alternating".

## 2015-10-06 ENCOUNTER — Ambulatory Visit (INDEPENDENT_AMBULATORY_CARE_PROVIDER_SITE_OTHER): Payer: Medicare Other | Admitting: Family Medicine

## 2015-10-06 ENCOUNTER — Telehealth: Payer: Self-pay | Admitting: Family Medicine

## 2015-10-06 ENCOUNTER — Encounter: Payer: Self-pay | Admitting: Family Medicine

## 2015-10-06 VITALS — BP 134/73 | HR 79 | Temp 98.0°F | Ht 63.1 in | Wt 174.0 lb

## 2015-10-06 DIAGNOSIS — Z125 Encounter for screening for malignant neoplasm of prostate: Secondary | ICD-10-CM

## 2015-10-06 DIAGNOSIS — M109 Gout, unspecified: Secondary | ICD-10-CM

## 2015-10-06 DIAGNOSIS — Z23 Encounter for immunization: Secondary | ICD-10-CM

## 2015-10-06 DIAGNOSIS — K22719 Barrett's esophagus with dysplasia, unspecified: Secondary | ICD-10-CM

## 2015-10-06 DIAGNOSIS — I10 Essential (primary) hypertension: Secondary | ICD-10-CM

## 2015-10-06 DIAGNOSIS — Z95810 Presence of automatic (implantable) cardiac defibrillator: Secondary | ICD-10-CM | POA: Diagnosis not present

## 2015-10-06 DIAGNOSIS — N403 Nodular prostate with lower urinary tract symptoms: Secondary | ICD-10-CM

## 2015-10-06 DIAGNOSIS — N401 Enlarged prostate with lower urinary tract symptoms: Secondary | ICD-10-CM | POA: Diagnosis not present

## 2015-10-06 DIAGNOSIS — I2510 Atherosclerotic heart disease of native coronary artery without angina pectoris: Secondary | ICD-10-CM | POA: Diagnosis not present

## 2015-10-06 DIAGNOSIS — D692 Other nonthrombocytopenic purpura: Secondary | ICD-10-CM

## 2015-10-06 DIAGNOSIS — N138 Other obstructive and reflux uropathy: Secondary | ICD-10-CM | POA: Insufficient documentation

## 2015-10-06 DIAGNOSIS — Z952 Presence of prosthetic heart valve: Secondary | ICD-10-CM | POA: Diagnosis not present

## 2015-10-06 DIAGNOSIS — Z950 Presence of cardiac pacemaker: Secondary | ICD-10-CM | POA: Insufficient documentation

## 2015-10-06 DIAGNOSIS — N183 Chronic kidney disease, stage 3 unspecified: Secondary | ICD-10-CM

## 2015-10-06 DIAGNOSIS — Z0000 Encounter for general adult medical examination without abnormal findings: Secondary | ICD-10-CM

## 2015-10-06 DIAGNOSIS — J449 Chronic obstructive pulmonary disease, unspecified: Secondary | ICD-10-CM

## 2015-10-06 LAB — URINALYSIS, ROUTINE W REFLEX MICROSCOPIC
Bilirubin, UA: NEGATIVE
Glucose, UA: NEGATIVE
Ketones, UA: NEGATIVE
LEUKOCYTES UA: NEGATIVE
Nitrite, UA: NEGATIVE
PH UA: 7 (ref 5.0–7.5)
PROTEIN UA: NEGATIVE
Specific Gravity, UA: 1.005 (ref 1.005–1.030)
Urobilinogen, Ur: 0.2 mg/dL (ref 0.2–1.0)

## 2015-10-06 LAB — COAGUCHEK XS/INR WAIVED
INR: 3.3 — ABNORMAL HIGH (ref 0.9–1.1)
Prothrombin Time: 40.2 s

## 2015-10-06 LAB — MICROSCOPIC EXAMINATION: WBC UA: NONE SEEN /HPF (ref 0–?)

## 2015-10-06 MED ORDER — WARFARIN SODIUM 6 MG PO TABS: 6.0000 mg | ORAL_TABLET | Freq: Every day | ORAL | Status: DC

## 2015-10-06 MED ORDER — METOPROLOL SUCCINATE ER 50 MG PO TB24
50.0000 mg | ORAL_TABLET | Freq: Two times a day (BID) | ORAL | Status: DC
Start: 2015-10-06 — End: 2016-10-11

## 2015-10-06 MED ORDER — TIOTROPIUM BROMIDE MONOHYDRATE 18 MCG IN CAPS: 18.0000 ug | ORAL_CAPSULE | Freq: Every day | RESPIRATORY_TRACT | Status: DC

## 2015-10-06 MED ORDER — LOSARTAN POTASSIUM 25 MG PO TABS: 25.0000 mg | ORAL_TABLET | Freq: Every day | ORAL | Status: DC

## 2015-10-06 MED ORDER — DILTIAZEM HCL ER 180 MG PO CP24: 180.0000 mg | ORAL_CAPSULE | Freq: Every day | ORAL | Status: DC

## 2015-10-06 MED ORDER — FLUTICASONE PROPIONATE 50 MCG/ACT NA SUSP: 2.0000 | Freq: Every day | NASAL | Status: DC

## 2015-10-06 MED ORDER — OMEPRAZOLE 20 MG PO CPDR: 20.0000 mg | DELAYED_RELEASE_CAPSULE | Freq: Every day | ORAL | Status: DC

## 2015-10-06 MED ORDER — METOPROLOL SUCCINATE ER 50 MG PO TB24
50.0000 mg | ORAL_TABLET | Freq: Two times a day (BID) | ORAL | Status: DC
Start: 2015-10-06 — End: 2015-10-06

## 2015-10-06 MED ORDER — WARFARIN SODIUM 5 MG PO TABS: 5.0000 mg | ORAL_TABLET | Freq: Every day | ORAL | Status: DC

## 2015-10-06 NOTE — Assessment & Plan Note (Signed)
Followed by cardiology 

## 2015-10-06 NOTE — Assessment & Plan Note (Signed)
The current medical regimen is effective;  continue present plan and medications.  

## 2015-10-06 NOTE — Progress Notes (Signed)
BP 134/73 mmHg  Pulse 79  Temp(Src) 98 F (36.7 C)  Ht 5' 3.1" (1.603 m)  Wt 174 lb (78.926 kg)  BMI 30.72 kg/m2  SpO2 97%   Subjective:    [name], male    DOB: 29-Sep-1941, 74 y.o.   MRN: [id number]  HPI: Kirk Jones is a 75 y.o. male  Chief Complaint  Patient presents with  . Annual Exam   doing well with blood pressure medications no complaints taking medications faithfully No problems with gout No side effects from any medications except for some expected bruising with warfarin that he takes faithfully. Breathing is doing stable Kidneys no issues or concerns   Relevant past medical, surgical, family and social history reviewed and updated as indicated. Interim medical history since our last visit reviewed. Allergies and medications reviewed and updated.  Review of Systems  Constitutional: Negative.   HENT: Negative.   Eyes: Negative.   Respiratory: Negative.   Cardiovascular: Negative.   Gastrointestinal: Negative.   Endocrine: Negative.   Genitourinary: Negative.   Musculoskeletal: Negative.   Skin: Negative.   Allergic/Immunologic: Negative.   Neurological: Negative.   Hematological: Negative.   Psychiatric/Behavioral: Negative.     Per HPI unless specifically indicated above     Objective:    BP 134/73 mmHg  Pulse 79  Temp(Src) 98 F (36.7 C)  Ht 5' 3.1" (1.603 m)  Wt 174 lb (78.926 kg)  BMI 30.72 kg/m2  SpO2 97%  Wt Readings from Last 3 Encounters:  10/06/15 174 lb (78.926 kg)  09/02/15 171 lb (77.565 kg)  07/31/15 174 lb (78.926 kg)    Physical Exam  Skin:  changes of senile purpura both arms    Results for orders placed or performed in visit on 09/02/15  ABO/Rh  Result Value Ref Range   ABO Grouping A    Rh Factor Positive       Assessment & Plan:   Problem List Items Addressed This Visit      Cardiovascular and Mediastinum   Senile purpura (HCC)   Relevant Medications   diltiazem (DILACOR  XR) 180 MG 24 hr capsule   losartan (COZAAR) 25 MG tablet   metoprolol succinate (TOPROL-XL) 50 MG 24 hr tablet   warfarin (COUMADIN) 6 MG tablet   warfarin (COUMADIN) 5 MG tablet   Hypertension    The current medical regimen is effective;  continue present plan and medications.       Relevant Medications   diltiazem (DILACOR XR) 180 MG 24 hr capsule   losartan (COZAAR) 25 MG tablet   metoprolol succinate (TOPROL-XL) 50 MG 24 hr tablet   warfarin (COUMADIN) 6 MG tablet   warfarin (COUMADIN) 5 MG tablet   Other Relevant Orders   Comprehensive metabolic panel   CBC with Differential/Platelet   Urinalysis, Routine w reflex microscopic (not at [facility])   TSH   CAD (coronary artery disease)    The current medical regimen is effective;  continue present plan and medications.       Relevant Medications   diltiazem (DILACOR XR) 180 MG 24 hr capsule   losartan (COZAAR) 25 MG tablet   metoprolol succinate (TOPROL-XL) 50 MG 24 hr tablet   warfarin (COUMADIN) 6 MG tablet   warfarin (COUMADIN) 5 MG tablet   Other Relevant Orders   Lipid panel   CBC with Differential/Platelet   Urinalysis, Routine w reflex microscopic (not at [facility])   TSH     Respiratory  COPD (chronic obstructive pulmonary disease) (HCC)    Has not needed albuterol inhaler for over a year breathing is otherwise okay but not great      Relevant Medications   fluticasone (FLONASE) 50 MCG/ACT nasal spray   tiotropium (SPIRIVA) 18 MCG inhalation capsule     Genitourinary   Nodular prostate with urinary obstruction   Relevant Orders   PSA   CKD (chronic kidney disease), stage III    stable followed by nephrology      Relevant Orders   Comprehensive metabolic panel   CBC with Differential/Platelet   Urinalysis, Routine w reflex microscopic (not at [facility])   TSH     Other   Pacemaker   History of prosthetic heart valve (Chronic)    Followed by cardiology      Relevant Orders   CoaguChek XS/INR Waived    Gout    Stable       Relevant Orders   Uric acid   Automatic implantable cardioverter-defibrillator in situ   Relevant Medications   warfarin (COUMADIN) 6 MG tablet   warfarin (COUMADIN) 5 MG tablet   Other Relevant Orders   CoaguChek XS/INR Waived    Other Visit Diagnoses    Immunization due    -  Primary    Relevant Orders    Pneumococcal conjugate vaccine 13-valent IM (Completed)    PE (physical exam), annual        Barrett's esophagus with dysplasia        Relevant Orders    Ambulatory referral to Gastroenterology        Follow up plan: Return in about 6 months (around 04/04/2016), or if symptoms worsen or fail to improve, for 1 mo INR 6 mo check up CBC, BMP, .

## 2015-10-06 NOTE — Assessment & Plan Note (Signed)
Has not needed albuterol inhaler for over a year breathing is otherwise okay but not great

## 2015-10-06 NOTE — Assessment & Plan Note (Signed)
Stable

## 2015-10-06 NOTE — Telephone Encounter (Signed)
Metoprolol sent in as 45 days instead of 90 days and [facility] wanted to verify if that was suppose to be for a 45 or 90 day supply like the other medications sent in.

## 2015-10-06 NOTE — Addendum Note (Signed)
Addended byGolden Pop on: 10/06/2015 09:59 AM   Modules accepted: Orders, SmartSet

## 2015-10-06 NOTE — Assessment & Plan Note (Signed)
stable followed by nephrology

## 2015-10-07 ENCOUNTER — Other Ambulatory Visit: Payer: Self-pay | Admitting: Family Medicine

## 2015-10-07 DIAGNOSIS — D649 Anemia, unspecified: Secondary | ICD-10-CM

## 2015-10-07 LAB — COMPREHENSIVE METABOLIC PANEL
ALK PHOS: 69 IU/L (ref 39–117)
ALT: 15 IU/L (ref 0–44)
AST: 41 IU/L — AB (ref 0–40)
Albumin/Globulin Ratio: 1.9 (ref 1.1–2.5)
Albumin: 4.4 g/dL (ref 3.5–4.8)
BUN/Creatinine Ratio: 15 (ref 10–22)
BUN: 18 mg/dL (ref 8–27)
Bilirubin Total: 0.4 mg/dL (ref 0.0–1.2)
CALCIUM: 9 mg/dL (ref 8.6–10.2)
CO2: 23 mmol/L (ref 18–29)
CREATININE: 1.21 mg/dL (ref 0.76–1.27)
Chloride: 95 mmol/L — ABNORMAL LOW (ref 97–106)
GFR calc Af Amer: 68 mL/min/{1.73_m2} (ref >59)
GFR, EST NON AFRICAN AMERICAN: 59 mL/min/{1.73_m2} — AB (ref >59)
GLOBULIN, TOTAL: 2.3 g/dL (ref 1.5–4.5)
GLUCOSE: 82 mg/dL (ref 65–99)
Potassium: 5.2 mmol/L (ref 3.5–5.2)
SODIUM: 137 mmol/L (ref 136–144)
Total Protein: 6.7 g/dL (ref 6.0–8.5)

## 2015-10-07 LAB — CBC WITH DIFFERENTIAL/PLATELET
BASOS ABS: 0.1 10*3/uL (ref 0.0–0.2)
BASOS: 2 %
EOS (ABSOLUTE): 0.1 10*3/uL (ref 0.0–0.4)
Eos: 3 %
Hematocrit: 38.2 % (ref 37.5–51.0)
Hemoglobin: 12.3 g/dL — ABNORMAL LOW (ref 12.6–17.7)
Immature Grans (Abs): 0 10*3/uL (ref 0.0–0.1)
Immature Granulocytes: 0 %
LYMPHS ABS: 1 10*3/uL (ref 0.7–3.1)
LYMPHS: 25 %
MCH: 33.7 pg — AB (ref 26.6–33.0)
MCHC: 32.2 g/dL (ref 31.5–35.7)
MCV: 105 fL — AB (ref 79–97)
Monocytes Absolute: 0.5 10*3/uL (ref 0.1–0.9)
Monocytes: 14 %
NEUTROS ABS: 2.1 10*3/uL (ref 1.4–7.0)
Neutrophils: 56 %
PLATELETS: 289 10*3/uL (ref 150–379)
RBC: 3.65 x10E6/uL — ABNORMAL LOW (ref 4.14–5.80)
RDW: 15.2 % (ref 12.3–15.4)
WBC: 3.8 10*3/uL (ref 3.4–10.8)

## 2015-10-07 LAB — TSH: TSH: 1.76 u[IU]/mL (ref 0.450–4.500)

## 2015-10-07 LAB — LIPID PANEL
CHOL/HDL RATIO: 2 ratio (ref 0.0–5.0)
CHOLESTEROL TOTAL: 235 mg/dL — AB (ref 100–199)
HDL: 119 mg/dL (ref >39)
LDL CALC: 102 mg/dL — AB (ref 0–99)
TRIGLYCERIDES: 70 mg/dL (ref 0–149)
VLDL Cholesterol Cal: 14 mg/dL (ref 5–40)

## 2015-10-07 LAB — PSA: Prostate Specific Ag, Serum: 2.9 ng/mL (ref 0.0–4.0)

## 2015-10-07 LAB — URIC ACID: Uric Acid: 10.5 mg/dL — ABNORMAL HIGH (ref 3.7–8.6)

## 2015-10-07 NOTE — Progress Notes (Signed)
Phone call Discussed with patient hemoglobin slightly low Will recheck CBC next office visit

## 2015-11-05 ENCOUNTER — Ambulatory Visit (INDEPENDENT_AMBULATORY_CARE_PROVIDER_SITE_OTHER): Payer: Medicare Other | Admitting: Family Medicine

## 2015-11-05 ENCOUNTER — Encounter: Payer: Self-pay | Admitting: Family Medicine

## 2015-11-05 VITALS — BP 129/73 | HR 96 | Temp 98.7°F | Ht 63.1 in | Wt 177.0 lb

## 2015-11-05 DIAGNOSIS — Z952 Presence of prosthetic heart valve: Secondary | ICD-10-CM

## 2015-11-05 DIAGNOSIS — I2510 Atherosclerotic heart disease of native coronary artery without angina pectoris: Secondary | ICD-10-CM | POA: Diagnosis not present

## 2015-11-05 LAB — COAGUCHEK XS/INR WAIVED
INR: 3 — ABNORMAL HIGH (ref 0.9–1.1)
PROTHROMBIN TIME: 36.4 s

## 2015-11-05 NOTE — Progress Notes (Signed)
BP 129/73 mmHg  Pulse 96  Temp(Src) 98.7 F (37.1 C)  Ht 5' 3.1" (1.603 m)  Wt 177 lb (80.287 kg)  BMI 31.24 kg/m2  SpO2 96%   Subjective:    [name], male    DOB: [birth year], 74 y.o.   MRN: [id number]  HPI: Kirk Jones is a 74 y.o. male  Chief Complaint  Patient presents with  . Atrial Fibrillation   Patient doing well with A. fib and valve replacement no complaints from warfarin no bruising no bleeding no issues with compliance Relevant past medical, surgical, family and social history reviewed and updated as indicated. Interim medical history since our last visit reviewed. Allergies and medications reviewed and updated.  Review of Systems  Per HPI unless specifically indicated above     Objective:    BP 129/73 mmHg  Pulse 96  Temp(Src) 98.7 F (37.1 C)  Ht 5' 3.1" (1.603 m)  Wt 177 lb (80.287 kg)  BMI 31.24 kg/m2  SpO2 96%  Wt Readings from Last 3 Encounters:  11/05/15 177 lb (80.287 kg)  10/06/15 174 lb (78.926 kg)  09/02/15 171 lb (77.565 kg)    Physical Exam  Constitutional: He is oriented to person, place, and time. He appears well-developed and well-nourished. No distress.  HENT:  Head: Normocephalic and atraumatic.  Right Ear: Hearing normal.  Left Ear: Hearing normal.  Nose: Nose normal.  Eyes: Conjunctivae and lids are normal. Right eye exhibits no discharge. Left eye exhibits no discharge. No scleral icterus.  Pulmonary/Chest: Effort normal. No respiratory distress.  Musculoskeletal: Normal range of motion.  Neurological: He is alert and oriented to person, place, and time.  Skin: Skin is intact. No rash noted.  No bruising  Psychiatric: He has a normal mood and affect. His speech is normal and behavior is normal. Judgment and thought content normal. Cognition and memory are normal.    Results for orders placed or performed in visit on 10/06/15  Microscopic Examination  Result Value Ref Range   WBC, UA None  seen 0 -  5 /hpf   RBC, UA 0-2 0 -  2 /hpf   Epithelial Cells (non renal) 0-10 0 - 10 /hpf  Comprehensive metabolic panel  Result Value Ref Range   Glucose 82 65 - 99 mg/dL   BUN 18 8 - 27 mg/dL   Creatinine, Ser 1.21 0.76 - 1.27 mg/dL   GFR calc non Af Amer 59 (L) >59 mL/min/1.73   GFR calc Af Amer 68 >59 mL/min/1.73   BUN/Creatinine Ratio 15 10 - 22   Sodium 137 136 - 144 mmol/L   Potassium 5.2 3.5 - 5.2 mmol/L   Chloride 95 (L) 97 - 106 mmol/L   CO2 23 18 - 29 mmol/L   Calcium 9.0 8.6 - 10.2 mg/dL   Total Protein 6.7 6.0 - 8.5 g/dL   Albumin 4.4 3.5 - 4.8 g/dL   Globulin, Total 2.3 1.5 - 4.5 g/dL   Albumin/Globulin Ratio 1.9 1.1 - 2.5   Bilirubin Total 0.4 0.0 - 1.2 mg/dL   Alkaline Phosphatase 69 39 - 117 IU/L   AST 41 (H) 0 - 40 IU/L   ALT 15 0 - 44 IU/L  Lipid panel  Result Value Ref Range   Cholesterol, Total 235 (H) 100 - 199 mg/dL   Triglycerides 70 0 - 149 mg/dL   HDL 119 >39 mg/dL   VLDL Cholesterol Cal 14 5 - 40 mg/dL   LDL Calculated 102 (  H) 0 - 99 mg/dL   Chol/HDL Ratio 2.0 0.0 - 5.0 ratio units  CBC with Differential/Platelet  Result Value Ref Range   WBC 3.8 3.4 - 10.8 x10E3/uL   RBC 3.65 (L) 4.14 - 5.80 x10E6/uL   Hemoglobin 12.3 (L) 12.6 - 17.7 g/dL   Hematocrit 38.2 37.5 - 51.0 %   MCV 105 (H) 79 - 97 fL   MCH 33.7 (H) 26.6 - 33.0 pg   MCHC 32.2 31.5 - 35.7 g/dL   RDW 15.2 12.3 - 15.4 %   Platelets 289 150 - 379 x10E3/uL   Neutrophils 56 %   Lymphs 25 %   Monocytes 14 %   Eos 3 %   Basos 2 %   Neutrophils Absolute 2.1 1.4 - 7.0 x10E3/uL   Lymphocytes Absolute 1.0 0.7 - 3.1 x10E3/uL   Monocytes Absolute 0.5 0.1 - 0.9 x10E3/uL   EOS (ABSOLUTE) 0.1 0.0 - 0.4 x10E3/uL   Basophils Absolute 0.1 0.0 - 0.2 x10E3/uL   Immature Granulocytes 0 %   Immature Grans (Abs) 0.0 0.0 - 0.1 x10E3/uL  PSA  Result Value Ref Range   Prostate Specific Ag, Serum 2.9 0.0 - 4.0 ng/mL  Urinalysis, Routine w reflex microscopic (not at [facility])  Result Value Ref Range    Specific Gravity, UA 1.005 1.005 - 1.030   pH, UA 7.0 5.0 - 7.5   Color, UA Yellow Yellow   Appearance Ur Clear Clear   Leukocytes, UA Negative Negative   Protein, UA Negative Negative/Trace   Glucose, UA Negative Negative   Ketones, UA Negative Negative   RBC, UA Trace (A) Negative   Bilirubin, UA Negative Negative   Urobilinogen, Ur 0.2 0.2 - 1.0 mg/dL   Nitrite, UA Negative Negative   Microscopic Examination See below:   TSH  Result Value Ref Range   TSH 1.760 0.450 - 4.500 uIU/mL  Uric acid  Result Value Ref Range   Uric Acid 10.5 (H) 3.7 - 8.6 mg/dL  CoaguChek XS/INR Waived  Result Value Ref Range   INR 3.3 (H) 0.9 - 1.1   Prothrombin Time 40.2 sec      Assessment & Plan:   Problem List Items Addressed This Visit      Other   History of prosthetic heart valve (Chronic)    The current medical regimen is effective;  continue present plan and medications.        Other Visit Diagnoses    Heart valve replaced by transplant    -  Primary    Relevant Orders    CoaguChek XS/INR Waived        Follow up plan: Return in about 4 weeks (around 12/03/2015) for PT INR.

## 2015-11-05 NOTE — Assessment & Plan Note (Signed)
The current medical regimen is effective;  continue present plan and medications.  

## 2015-11-10 DIAGNOSIS — D472 Monoclonal gammopathy: Secondary | ICD-10-CM | POA: Diagnosis not present

## 2015-11-10 DIAGNOSIS — R600 Localized edema: Secondary | ICD-10-CM | POA: Diagnosis not present

## 2015-11-10 DIAGNOSIS — N183 Chronic kidney disease, stage 3 (moderate): Secondary | ICD-10-CM | POA: Diagnosis not present

## 2015-11-10 DIAGNOSIS — I10 Essential (primary) hypertension: Secondary | ICD-10-CM | POA: Diagnosis not present

## 2015-11-14 ENCOUNTER — Other Ambulatory Visit: Payer: Self-pay | Admitting: Family Medicine

## 2015-11-27 DIAGNOSIS — I481 Persistent atrial fibrillation: Secondary | ICD-10-CM | POA: Diagnosis not present

## 2015-11-27 DIAGNOSIS — Z86010 Personal history of colonic polyps: Secondary | ICD-10-CM | POA: Diagnosis not present

## 2015-11-27 DIAGNOSIS — Z952 Presence of prosthetic heart valve: Secondary | ICD-10-CM | POA: Diagnosis not present

## 2015-11-27 DIAGNOSIS — R1031 Right lower quadrant pain: Secondary | ICD-10-CM | POA: Diagnosis not present

## 2015-11-27 DIAGNOSIS — Z7901 Long term (current) use of anticoagulants: Secondary | ICD-10-CM | POA: Diagnosis not present

## 2015-11-27 DIAGNOSIS — Z95810 Presence of automatic (implantable) cardiac defibrillator: Secondary | ICD-10-CM | POA: Diagnosis not present

## 2015-11-27 DIAGNOSIS — K2270 Barrett's esophagus without dysplasia: Secondary | ICD-10-CM | POA: Diagnosis not present

## 2015-11-30 HISTORY — PX: CATARACT EXTRACTION: SUR2

## 2015-12-09 ENCOUNTER — Ambulatory Visit: Payer: Medicare Other | Admitting: Family Medicine

## 2015-12-09 ENCOUNTER — Telehealth: Payer: Self-pay

## 2015-12-09 NOTE — Telephone Encounter (Signed)
[facility] and patient wanting clarification, historically Omeprazole '20mg'$  BID  Now written for QD, please change back to BID if appropriate

## 2015-12-10 ENCOUNTER — Encounter: Payer: Self-pay | Admitting: Family Medicine

## 2015-12-10 ENCOUNTER — Ambulatory Visit (INDEPENDENT_AMBULATORY_CARE_PROVIDER_SITE_OTHER): Payer: Medicare Other | Admitting: Family Medicine

## 2015-12-10 VITALS — BP 150/83 | HR 93 | Temp 98.6°F | Ht 63.1 in | Wt 175.0 lb

## 2015-12-10 DIAGNOSIS — Z952 Presence of prosthetic heart valve: Secondary | ICD-10-CM

## 2015-12-10 LAB — COAGUCHEK XS/INR WAIVED
INR: 4.5 — ABNORMAL HIGH (ref 0.9–1.1)
Prothrombin Time: 54.5 s

## 2015-12-10 MED ORDER — OMEPRAZOLE 20 MG PO CPDR: 20.0000 mg | DELAYED_RELEASE_CAPSULE | Freq: Two times a day (BID) | ORAL | Status: DC

## 2015-12-10 NOTE — Progress Notes (Signed)
BP 150/83 mmHg  Pulse 93  Temp(Src) 98.6 F (37 C)  Ht 5' 3.1" (1.603 m)  Wt 175 lb (79.379 kg)  BMI 30.89 kg/m2  SpO2 95%   Subjective:    [name], male    DOB: 07-13-1941, 75 y.o.   MRN: [id number]  CC: Coumadin management  HPI: This patient is a 75 y.o. male who presents for coumadin management. The expected duration of coumadin treatment is lifelong The reason for anticoagulation is  mechanical heart valve.  Present Coumadin dose: 4, 5 alternating Goal: 2.5-3.5  Excessive bruising: no Nose bleeding: yes Rectal bleeding: no Prolonged menstrual cycles: N/A Eating diet with consistent amounts of foods containing Vitamin K:yes Any recent antibiotic use? no  Relevant past medical, surgical, family and social history reviewed and updated as indicated. Interim medical history since our last visit reviewed. Allergies and medications reviewed and updated.  ROS: Per HPI unless specifically indicated above     Objective:    BP 150/83 mmHg  Pulse 93  Temp(Src) 98.6 F (37 C)  Ht 5' 3.1" (1.603 m)  Wt 175 lb (79.379 kg)  BMI 30.89 kg/m2  SpO2 95%  Wt Readings from Last 3 Encounters:  12/10/15 175 lb (79.379 kg)  11/05/15 177 lb (80.287 kg)  10/06/15 174 lb (78.926 kg)     General: Well appearing, well nourished in no distress.  Normal mood and affect. Skin: No excessive bruising or rash  Last INR: 4.5 Last PT: 54.5    Last CBC:  Lab Results  Component Value Date   WBC 3.8 10/06/2015   HGB 10.2* 11/20/2014   HCT 38.2 10/06/2015   MCV 105* 10/06/2015   PLT 289 10/06/2015    Results for orders placed or performed in visit on 11/05/15  CoaguChek XS/INR Waived  Result Value Ref Range   INR 3.0 (H) 0.9 - 1.1   Prothrombin Time 36.4 sec       Assessment:     ICD-9-CM ICD-10-CM   1. Heart valve replaced by transplant V42.2 Z95.2 CoaguChek XS/INR Waived    Plan:   Discussed current plan face-to-face with patient. For coumadin dosing,  elected to continue current dose to 4 mg daily. Will plan to recheck INR in Monday. Not going to have colonoscopy due to risks. Thinks that he might have a hernia, but it might be gas. Had it checked out by the surgeon, didn't feel like it was a hernia. He thinks that it might be gas, got better with gas-x.

## 2015-12-10 NOTE — Telephone Encounter (Signed)
Rx sent to his pharmacy. Dose confirmed with patient at appointment.

## 2015-12-15 ENCOUNTER — Ambulatory Visit (INDEPENDENT_AMBULATORY_CARE_PROVIDER_SITE_OTHER): Payer: Medicare Other | Admitting: Family Medicine

## 2015-12-15 ENCOUNTER — Encounter: Payer: Self-pay | Admitting: Family Medicine

## 2015-12-15 ENCOUNTER — Other Ambulatory Visit: Payer: Self-pay | Admitting: Family Medicine

## 2015-12-15 VITALS — BP 99/64 | HR 78 | Temp 98.4°F | Ht 62.3 in | Wt 173.0 lb

## 2015-12-15 DIAGNOSIS — D649 Anemia, unspecified: Secondary | ICD-10-CM

## 2015-12-15 DIAGNOSIS — Z952 Presence of prosthetic heart valve: Secondary | ICD-10-CM

## 2015-12-15 DIAGNOSIS — I4891 Unspecified atrial fibrillation: Secondary | ICD-10-CM | POA: Diagnosis not present

## 2015-12-15 DIAGNOSIS — I10 Essential (primary) hypertension: Secondary | ICD-10-CM

## 2015-12-15 LAB — CBC WITH DIFFERENTIAL/PLATELET
HEMATOCRIT: 41.6 % (ref 37.5–51.0)
HEMOGLOBIN: 14.4 g/dL (ref 12.6–17.7)
Lymphocytes Absolute: 1.1 10*3/uL (ref 0.7–3.1)
Lymphs: 22 %
MCH: 32.4 pg (ref 26.6–33.0)
MCHC: 34.6 g/dL (ref 31.5–35.7)
MCV: 94 fL (ref 79–97)
MID (Absolute): 0.5 10*3/uL (ref 0.1–1.6)
MID: 9 %
NEUTROS ABS: 3.2 10*3/uL (ref 1.4–7.0)
NEUTROS PCT: 69 %
Platelets: 220 10*3/uL (ref 150–379)
RBC: 4.44 x10E6/uL (ref 4.14–5.80)
RDW: 15.3 % (ref 12.3–15.4)
WBC: 4.8 10*3/uL (ref 3.4–10.8)

## 2015-12-15 LAB — COAGUCHEK XS/INR WAIVED
INR: 2.7 — AB (ref 0.9–1.1)
Prothrombin Time: 31.8 s

## 2015-12-15 NOTE — Assessment & Plan Note (Addendum)
BP very low today, getting dizzy when going from sitting to standing. Will check orthostatics. BP elevated on recheck, but pulse drops significantly when he stands up. Normalized after standing for about a minute Seeing Cardiology on Thursday. He states that this has been going on for a long time. Warning signs to go to ER discussed. Continue to monitor closely.

## 2015-12-15 NOTE — Progress Notes (Signed)
BP 99/64 mmHg  Pulse 78  Temp(Src) 98.4 F (36.9 C)  Ht 5' 2.3" (1.582 m)  Wt 173 lb (78.472 kg)  BMI 31.35 kg/m2  SpO2 98%   Subjective:    [name], male    DOB: [birth year], 75 y.o.   MRN: [id number]  CC: Coumadin management  HPI: This patient is a 75 y.o. male who presents for coumadin management. The expected duration of coumadin treatment is lifelong The reason for anticoagulation is  non-mechanical heart valve.  Present Coumadin dose: '4mg'$  daily Goal: 2.5-3.5  Excessive bruising: no Nose bleeding: no Rectal bleeding: no Prolonged menstrual cycles: N/A Eating diet with consistent amounts of foods containing Vitamin K:yes Any recent antibiotic use? no  HYPERTENSION Hypertension status: over controlled  Satisfied with current treatment? yes Duration of hypertension: chronic BP monitoring frequency:  not checking BP medication side effects:  no Medication compliance: excellent compliance Aspirin: no Recurrent headaches: no Visual changes: yes Palpitations: no Dyspnea: no Chest pain: no Lower extremity edema: no Dizzy/lightheaded: yes   Relevant past medical, surgical, family and social history reviewed and updated as indicated. Interim medical history since our last visit reviewed. Allergies and medications reviewed and updated.  Review of Systems  Constitutional: Negative.   HENT: Negative.   Respiratory: Negative.   Cardiovascular: Negative.   Skin: Negative.   Psychiatric/Behavioral: Negative.        Objective:    BP 99/64 mmHg  Pulse 78  Temp(Src) 98.4 F (36.9 C)  Ht 5' 2.3" (1.582 m)  Wt 173 lb (78.472 kg)  BMI 31.35 kg/m2  SpO2 98%  Wt Readings from Last 3 Encounters:  12/15/15 173 lb (78.472 kg)  12/10/15 175 lb (79.379 kg)  11/05/15 177 lb (80.287 kg)    Orthostatic VS for the past 24 hrs:  BP- Lying Pulse- Lying BP- Sitting Pulse- Sitting BP- Standing at 0 minutes Pulse- Standing at 0 minutes  12/15/15 1008 146/82  mmHg 87 153/80 mmHg 86 153/82 mmHg (!) 47   Physical Exam  Constitutional: He is oriented to person, place, and time. He appears well-developed and well-nourished. No distress.  HENT:  Head: Normocephalic and atraumatic.  Right Ear: External ear normal.  Left Ear: External ear normal.  Nose: Nose normal.  Mouth/Throat: No oropharyngeal exudate.  Eyes: Conjunctivae are normal. Pupils are equal, round, and reactive to light. Right eye exhibits no discharge. Left eye exhibits no discharge. No scleral icterus.  Neck: Normal range of motion. Neck supple.  Cardiovascular: Normal rate.  An irregularly irregular rhythm present.  Pulmonary/Chest: Effort normal.  Musculoskeletal: Normal range of motion.  Neurological: He is alert and oriented to person, place, and time.  Skin: Skin is warm and dry. No rash noted. He is not diaphoretic. No erythema. No pallor.  Psychiatric: He has a normal mood and affect. His behavior is normal. Judgment and thought content normal.  Nursing note and vitals reviewed.   Last INR: 2.7 Last PT: 31.8   Last CBC: (drawn today) Lab Results  Component Value Date   WBC 3.8 10/06/2015   HGB 10.2* 11/20/2014   HCT 38.2 10/06/2015   MCV 105* 10/06/2015   PLT 289 10/06/2015    Results for orders placed or performed in visit on 12/10/15  CoaguChek XS/INR Waived  Result Value Ref Range   INR 4.5 (H) 0.9 - 1.1   Prothrombin Time 54.5 sec       Assessment:     ICD-9-CM ICD-10-CM   1. Essential  hypertension 401.9 I10   2. History of prosthetic heart valve V43.3 Z95.2 CoaguChek XS/INR Waived  3. Anemia, unspecified anemia type 285.9 D64.9 CBC With Differential/Platelet   Under good control. Continue current regimen. Continue to monitor.     Plan:   Discussed current plan face-to-face with patient. For coumadin dosing, elected to continue current dose. Will plan to recheck INR in 1 week.  Problem List Items Addressed This Visit      Cardiovascular and  Mediastinum   Hypertension - Primary    BP very low today, getting dizzy when going from sitting to standing. Will check orthostatics. BP elevated on recheck, but pulse drops significantly when he stands up. Normalized after standing for about a minute Seeing Cardiology on Thursday. He states that this has been going on for a long time. Warning signs to go to ER discussed. Continue to monitor closely.        Other   History of prosthetic heart valve (Chronic)    Other Visit Diagnoses    Anemia, unspecified anemia type        Under good control. Continue current regimen. Continue to monitor.

## 2015-12-18 DIAGNOSIS — E7800 Pure hypercholesterolemia, unspecified: Secondary | ICD-10-CM | POA: Diagnosis not present

## 2015-12-18 DIAGNOSIS — I2510 Atherosclerotic heart disease of native coronary artery without angina pectoris: Secondary | ICD-10-CM | POA: Diagnosis not present

## 2015-12-18 DIAGNOSIS — I255 Ischemic cardiomyopathy: Secondary | ICD-10-CM | POA: Diagnosis not present

## 2015-12-18 DIAGNOSIS — I481 Persistent atrial fibrillation: Secondary | ICD-10-CM | POA: Diagnosis not present

## 2015-12-18 DIAGNOSIS — I4891 Unspecified atrial fibrillation: Secondary | ICD-10-CM | POA: Diagnosis not present

## 2015-12-18 DIAGNOSIS — Z952 Presence of prosthetic heart valve: Secondary | ICD-10-CM | POA: Diagnosis not present

## 2015-12-18 DIAGNOSIS — Z8673 Personal history of transient ischemic attack (TIA), and cerebral infarction without residual deficits: Secondary | ICD-10-CM | POA: Diagnosis not present

## 2015-12-18 DIAGNOSIS — I10 Essential (primary) hypertension: Secondary | ICD-10-CM | POA: Diagnosis not present

## 2015-12-18 DIAGNOSIS — I5032 Chronic diastolic (congestive) heart failure: Secondary | ICD-10-CM | POA: Diagnosis not present

## 2015-12-18 DIAGNOSIS — Z95810 Presence of automatic (implantable) cardiac defibrillator: Secondary | ICD-10-CM | POA: Diagnosis not present

## 2015-12-18 DIAGNOSIS — Z7901 Long term (current) use of anticoagulants: Secondary | ICD-10-CM | POA: Diagnosis not present

## 2015-12-18 DIAGNOSIS — I472 Ventricular tachycardia: Secondary | ICD-10-CM | POA: Diagnosis not present

## 2015-12-18 DIAGNOSIS — Z9861 Coronary angioplasty status: Secondary | ICD-10-CM | POA: Diagnosis not present

## 2015-12-22 ENCOUNTER — Other Ambulatory Visit: Payer: Self-pay | Admitting: Family Medicine

## 2015-12-22 ENCOUNTER — Ambulatory Visit: Payer: Medicare Other | Admitting: Family Medicine

## 2015-12-22 DIAGNOSIS — I4891 Unspecified atrial fibrillation: Secondary | ICD-10-CM

## 2015-12-24 ENCOUNTER — Ambulatory Visit (INDEPENDENT_AMBULATORY_CARE_PROVIDER_SITE_OTHER): Payer: Medicare Other | Admitting: Family Medicine

## 2015-12-24 ENCOUNTER — Encounter: Payer: Self-pay | Admitting: Family Medicine

## 2015-12-24 VITALS — BP 145/81 | HR 86 | Temp 98.9°F | Ht 62.3 in | Wt 173.0 lb

## 2015-12-24 DIAGNOSIS — I4891 Unspecified atrial fibrillation: Secondary | ICD-10-CM

## 2015-12-24 DIAGNOSIS — Z952 Presence of prosthetic heart valve: Secondary | ICD-10-CM

## 2015-12-24 LAB — COAGUCHEK XS/INR WAIVED
INR: 4.2 — AB (ref 0.9–1.1)
PROTHROMBIN TIME: 50.7 s

## 2015-12-24 MED ORDER — WARFARIN SODIUM 1 MG PO TABS: 1.0000 mg | ORAL_TABLET | Freq: Every day | ORAL | Status: DC

## 2015-12-24 MED ORDER — WARFARIN SODIUM 3 MG PO TABS: 3.0000 mg | ORAL_TABLET | Freq: Every day | ORAL | Status: DC

## 2015-12-24 NOTE — Progress Notes (Signed)
BP 145/81 mmHg  Pulse 86  Temp(Src) 98.9 F (37.2 C)  Ht 5' 2.3" (1.582 m)  Wt 173 lb (78.472 kg)  BMI 31.35 kg/m2  SpO2 97%   Subjective:    [name], male    DOB: 05-Sep-1941, 75 y.o.   MRN: [id number]  CC: Coumadin management  HPI: This patient is a 75 y.o. male who presents for coumadin management. The expected duration of coumadin treatment is lifelong The reason for anticoagulation is  mechanical heart valve.  Present Coumadin dose: 4.0 Goal: 2.5-3.5  Excessive bruising: no Nose bleeding: no Rectal bleeding: no Prolonged menstrual cycles: N/A Eating diet with consistent amounts of foods containing Vitamin K:no Any recent antibiotic use? no  Relevant past medical, surgical, family and social history reviewed and updated as indicated. Interim medical history since our last visit reviewed. Allergies and medications reviewed and updated.  ROS: Per HPI unless specifically indicated above     Objective:    BP 145/81 mmHg  Pulse 86  Temp(Src) 98.9 F (37.2 C)  Ht 5' 2.3" (1.582 m)  Wt 173 lb (78.472 kg)  BMI 31.35 kg/m2  SpO2 97%  Wt Readings from Last 3 Encounters:  12/24/15 173 lb (78.472 kg)  12/15/15 173 lb (78.472 kg)  12/10/15 175 lb (79.379 kg)     General: Well appearing, well nourished in no distress.  Normal mood and affect. Skin: No excessive bruising or rash  Last INR: 4.2 PT: 50.7    Last CBC:  Lab Results  Component Value Date   WBC 4.8 12/15/2015   HGB 10.2* 11/20/2014   HCT 41.6 12/15/2015   MCV 94 12/15/2015   PLT 220 12/15/2015    Results for orders placed or performed in visit on 12/15/15  CBC With Differential/Platelet  Result Value Ref Range   WBC 4.8 3.4 - 10.8 x10E3/uL   RBC 4.44 4.14 - 5.80 x10E6/uL   Hemoglobin 14.4 12.6 - 17.7 g/dL   Hematocrit 41.6 37.5 - 51.0 %   MCV 94 79 - 97 fL   MCH 32.4 26.6 - 33.0 pg   MCHC 34.6 31.5 - 35.7 g/dL   RDW 15.3 12.3 - 15.4 %   Platelets 220 150 - 379 x10E3/uL   Neutrophils 69 %   Lymphs 22 %   MID 9 %   Neutrophils Absolute 3.2 1.4 - 7.0 x10E3/uL   Lymphocytes Absolute 1.1 0.7 - 3.1 x10E3/uL   MID (Absolute) 0.5 0.1 - 1.6 X10E3/uL  CoaguChek XS/INR Waived  Result Value Ref Range   INR 2.7 (H) 0.9 - 1.1   Prothrombin Time 31.8 sec       Assessment:     ICD-9-CM ICD-10-CM   1. Atrial fibrillation, unspecified type ([place]) 427.31 I48.91 CoaguChek XS/INR Waived    Plan:   Discussed current plan face-to-face with patient. For coumadin dosing, elected to change dose to 3.'5mg'$  daily. Will plan to recheck INR in 1 week.

## 2015-12-31 ENCOUNTER — Ambulatory Visit (INDEPENDENT_AMBULATORY_CARE_PROVIDER_SITE_OTHER): Payer: Medicare Other | Admitting: Family Medicine

## 2015-12-31 ENCOUNTER — Encounter: Payer: Self-pay | Admitting: Family Medicine

## 2015-12-31 VITALS — BP 173/85 | HR 93 | Temp 98.4°F | Ht 63.7 in | Wt 169.0 lb

## 2015-12-31 DIAGNOSIS — I4891 Unspecified atrial fibrillation: Secondary | ICD-10-CM

## 2015-12-31 DIAGNOSIS — I10 Essential (primary) hypertension: Secondary | ICD-10-CM | POA: Diagnosis not present

## 2015-12-31 LAB — COAGUCHEK XS/INR WAIVED
INR: 1.4 — ABNORMAL HIGH (ref 0.9–1.1)
Prothrombin Time: 17.3 s

## 2015-12-31 NOTE — Progress Notes (Signed)
   BP 173/85 mmHg  Pulse 93  Temp(Src) 98.4 F (36.9 C)  Ht 5' 3.7" (1.618 m)  Wt 169 lb (76.658 kg)  BMI 29.28 kg/m2  SpO2 99%   Subjective:    [name], male    DOB: 04-Jan-1941, 75 y.o.   MRN: [id number]  HPI: Kirk Jones is a 75 y.o. male  Chief Complaint  Patient presents with  . Atrial Fibrillation   heart valve replacement Patient had been on for alternating 5 and INR was too high drop back to 3.5 INR now to low Patient's blood pressure last week was low with a normal pulse today units high with normal pulse No change in patient's medications  Relevant past medical, surgical, family and social history reviewed and updated as indicated. Interim medical history since our last visit reviewed. Allergies and medications reviewed and updated.  Review of Systems  Constitutional: Negative.   Respiratory: Negative.   Cardiovascular: Negative.     Per HPI unless specifically indicated above     Objective:    BP 173/85 mmHg  Pulse 93  Temp(Src) 98.4 F (36.9 C)  Ht 5' 3.7" (1.618 m)  Wt 169 lb (76.658 kg)  BMI 29.28 kg/m2  SpO2 99%  Wt Readings from Last 3 Encounters:  12/31/15 169 lb (76.658 kg)  12/24/15 173 lb (78.472 kg)  12/15/15 173 lb (78.472 kg)    Physical Exam  Constitutional: He is oriented to person, place, and time. He appears well-developed and well-nourished. No distress.  HENT:  Head: Normocephalic and atraumatic.  Right Ear: Hearing normal.  Left Ear: Hearing normal.  Nose: Nose normal.  Eyes: Conjunctivae and lids are normal. Right eye exhibits no discharge. Left eye exhibits no discharge. No scleral icterus.  Cardiovascular: Exam reveals no gallop and no friction rub.   Murmur heard. Pulmonary/Chest: Effort normal and breath sounds normal. No respiratory distress.  Musculoskeletal: Normal range of motion.  Neurological: He is alert and oriented to person, place, and time.  Skin: Skin is intact. No rash noted.   Psychiatric: He has a normal mood and affect. His speech is normal and behavior is normal. Judgment and thought content normal. Cognition and memory are normal.    Results for orders placed or performed in visit on 12/24/15  CoaguChek XS/INR Waived  Result Value Ref Range   INR 4.2 (H) 0.9 - 1.1   Prothrombin Time 50.7 sec      Assessment & Plan:   Problem List Items Addressed This Visit      Cardiovascular and Mediastinum   Hypertension    Blood pressure up and down for now because of risk of falling was getting too low will leave blood pressure medications alone just observe      Relevant Medications   warfarin (COUMADIN) 5 MG tablet   Atrial fibrillation ([facility]) - Primary (Chronic)    INR too low will increase warfarin to 4 mg discussed consistent diet with patient and recheck INR 2 weeks      Relevant Medications   warfarin (COUMADIN) 5 MG tablet   Other Relevant Orders   CoaguChek XS/INR Waived       Follow up plan: Return in about 2 weeks (around 01/14/2016) for PT INR.

## 2015-12-31 NOTE — Assessment & Plan Note (Signed)
INR too low will increase warfarin to 4 mg discussed consistent diet with patient and recheck INR 2 weeks

## 2015-12-31 NOTE — Assessment & Plan Note (Signed)
Blood pressure up and down for now because of risk of falling was getting too low will leave blood pressure medications alone just observe

## 2016-01-05 ENCOUNTER — Ambulatory Visit: Admit: 2016-01-05 | Payer: Self-pay | Admitting: Unknown Physician Specialty

## 2016-01-05 SURGERY — COLONOSCOPY WITH PROPOFOL
Anesthesia: General

## 2016-01-14 ENCOUNTER — Ambulatory Visit (INDEPENDENT_AMBULATORY_CARE_PROVIDER_SITE_OTHER): Payer: Medicare Other | Admitting: Family Medicine

## 2016-01-14 ENCOUNTER — Encounter: Payer: Self-pay | Admitting: Family Medicine

## 2016-01-14 VITALS — BP 120/61 | HR 57 | Temp 98.1°F | Ht 63.0 in | Wt 173.0 lb

## 2016-01-14 DIAGNOSIS — Z952 Presence of prosthetic heart valve: Secondary | ICD-10-CM

## 2016-01-14 DIAGNOSIS — I10 Essential (primary) hypertension: Secondary | ICD-10-CM

## 2016-01-14 DIAGNOSIS — I4891 Unspecified atrial fibrillation: Secondary | ICD-10-CM | POA: Diagnosis not present

## 2016-01-14 LAB — COAGUCHEK XS/INR WAIVED
INR: 2.5 — AB (ref 0.9–1.1)
Prothrombin Time: 29.9 s

## 2016-01-14 NOTE — Progress Notes (Signed)
   BP 120/61 mmHg  Pulse 57  Temp(Src) 98.1 F (36.7 C)  Ht '5\' 3"'$  (1.6 m)  Wt 173 lb (78.472 kg)  BMI 30.65 kg/m2  SpO2 97%   Subjective:    [name], male    DOB: 1941/07/05, 75 y.o.   MRN: [id number]  HPI: Kirk Jones is a 75 y.o. male  Chief Complaint  Patient presents with  . Atrial Fibrillation   is in follow-up atrial fibrillation valve replacement with INR goal 2.5-3.5 patient's now taking 4 mg a day no other changes. Patient doing well with no excessive bleeding bruising or complaints. Blood pressure doing well first time in a long time weight stable no side effects from medications and takes faithfully.  Relevant past medical, surgical, family and social history reviewed and updated as indicated. Interim medical history since our last visit reviewed. Allergies and medications reviewed and updated.  Review of Systems  Constitutional: Negative.   Respiratory: Negative.   Cardiovascular: Negative.   Skin: Negative.     Per HPI unless specifically indicated above     Objective:    BP 120/61 mmHg  Pulse 57  Temp(Src) 98.1 F (36.7 C)  Ht '5\' 3"'$  (1.6 m)  Wt 173 lb (78.472 kg)  BMI 30.65 kg/m2  SpO2 97%  Wt Readings from Last 3 Encounters:  01/14/16 173 lb (78.472 kg)  12/31/15 169 lb (76.658 kg)  12/24/15 173 lb (78.472 kg)    Physical Exam  Constitutional: He is oriented to person, place, and time. He appears well-developed and well-nourished. No distress.  HENT:  Head: Normocephalic and atraumatic.  Right Ear: Hearing normal.  Left Ear: Hearing normal.  Nose: Nose normal.  Eyes: Conjunctivae and lids are normal. Right eye exhibits no discharge. Left eye exhibits no discharge. No scleral icterus.  Cardiovascular: Normal heart sounds.   Pulmonary/Chest: Effort normal and breath sounds normal. No respiratory distress.  Musculoskeletal: Normal range of motion.  Neurological: He is alert and oriented to person, place, and time.   Skin: Skin is intact. No rash noted.  Psychiatric: He has a normal mood and affect. His speech is normal and behavior is normal. Judgment and thought content normal. Cognition and memory are normal.    Results for orders placed or performed in visit on 12/31/15  CoaguChek XS/INR Waived  Result Value Ref Range   INR 1.4 (H) 0.9 - 1.1   Prothrombin Time 17.3 sec      Assessment & Plan:   Problem List Items Addressed This Visit      Cardiovascular and Mediastinum   Hypertension    The current medical regimen is effective;  continue present plan and medications.       Atrial fibrillation ([place]) - Primary (Chronic)    Patient doing stable we'll continue 4 mg recheck INR in 1 month      Relevant Orders   CoaguChek XS/INR Waived     Other   History of prosthetic heart valve (Chronic)   Relevant Orders   CoaguChek XS/INR Waived       Follow up plan: Return in about 4 weeks (around 02/11/2016) for PT INR.

## 2016-01-14 NOTE — Assessment & Plan Note (Signed)
Patient doing stable we'll continue 4 mg recheck INR in 1 month

## 2016-01-14 NOTE — Assessment & Plan Note (Signed)
The current medical regimen is effective;  continue present plan and medications.  

## 2016-02-05 ENCOUNTER — Encounter: Payer: Self-pay | Admitting: *Deleted

## 2016-02-11 ENCOUNTER — Ambulatory Visit (INDEPENDENT_AMBULATORY_CARE_PROVIDER_SITE_OTHER): Payer: Medicare Other | Admitting: Family Medicine

## 2016-02-11 ENCOUNTER — Encounter: Payer: Self-pay | Admitting: Family Medicine

## 2016-02-11 VITALS — BP 139/82 | HR 97 | Temp 98.1°F | Ht 62.3 in | Wt 171.8 lb

## 2016-02-11 DIAGNOSIS — I4891 Unspecified atrial fibrillation: Secondary | ICD-10-CM

## 2016-02-11 DIAGNOSIS — Z952 Presence of prosthetic heart valve: Secondary | ICD-10-CM

## 2016-02-11 LAB — COAGUCHEK XS/INR WAIVED
INR: 3.8 — AB (ref 0.9–1.1)
Prothrombin Time: 45.6 s

## 2016-02-11 NOTE — Progress Notes (Signed)
   BP 139/82 mmHg  Pulse 97  Temp(Src) 98.1 F (36.7 C)  Ht 5' 2.3" (1.582 m)  Wt 171 lb 12.8 oz (77.928 kg)  BMI 31.14 kg/m2  SpO2 99%   Subjective:    [name], male    DOB: 31-Jul-1941, 75 y.o.   MRN: [id number]  HPI: Kirk Jones is a 75 y.o. male  Chief Complaint  Patient presents with  . Atrial Fibrillation   And valve replacement patient with some extra bruising INR of 3.8 today Patient's INR was increased to '4mg'$  from alternating 4 and 3 mg a month ago with INR of 1.4 then 2 weeks ago INR increased to 2.5 and 4 mg was continued. Patient's has maintained 4 mg but diet is changed somewhat with less vegetables and green stuff Also is noted some extra bruising.  Relevant past medical, surgical, family and social history reviewed and updated as indicated. Interim medical history since our last visit reviewed. Allergies and medications reviewed and updated.  Review of Systems  Constitutional: Negative.   Respiratory: Negative.   Cardiovascular: Negative.     Per HPI unless specifically indicated above     Objective:    BP 139/82 mmHg  Pulse 97  Temp(Src) 98.1 F (36.7 C)  Ht 5' 2.3" (1.582 m)  Wt 171 lb 12.8 oz (77.928 kg)  BMI 31.14 kg/m2  SpO2 99%  Wt Readings from Last 3 Encounters:  02/11/16 171 lb 12.8 oz (77.928 kg)  01/14/16 173 lb (78.472 kg)  12/31/15 169 lb (76.658 kg)    Physical Exam  Constitutional: He is oriented to person, place, and time. He appears well-developed and well-nourished. No distress.  HENT:  Head: Normocephalic and atraumatic.  Right Ear: Hearing normal.  Left Ear: Hearing normal.  Nose: Nose normal.  Eyes: Conjunctivae and lids are normal. Right eye exhibits no discharge. Left eye exhibits no discharge. No scleral icterus.  Cardiovascular: Normal rate, regular rhythm and normal heart sounds.   Pulmonary/Chest: Effort normal and breath sounds normal. No respiratory distress.  Musculoskeletal: Normal  range of motion.  Neurological: He is alert and oriented to person, place, and time.  Skin: Skin is intact. No rash noted.  Psychiatric: He has a normal mood and affect. His speech is normal and behavior is normal. Judgment and thought content normal. Cognition and memory are normal.    Results for orders placed or performed in visit on 01/14/16  CoaguChek XS/INR Waived  Result Value Ref Range   INR 2.5 (H) 0.9 - 1.1   Prothrombin Time 29.9 sec      Assessment & Plan:   Problem List Items Addressed This Visit      Cardiovascular and Mediastinum   Atrial fibrillation ([place]) - Primary (Chronic)   Relevant Orders   CoaguChek XS/INR Waived     Other   History of prosthetic heart valve (Chronic)    Continue current dose of 4 mg a day we will recheck INR 2 weeks Patient will increase his vegetables green stuff Patient will hold warfarin for today.        because of high risk of high-dose anticoagulation will recheck in 2 weeks for repeat INR.  Follow up plan: Return in about 2 weeks (around 02/25/2016) for PT INR.

## 2016-02-11 NOTE — Assessment & Plan Note (Signed)
Continue current dose of 4 mg a day we will recheck INR 2 weeks Patient will increase his vegetables green stuff Patient will hold warfarin for today.

## 2016-02-25 ENCOUNTER — Encounter: Payer: Self-pay | Admitting: Family Medicine

## 2016-02-25 ENCOUNTER — Ambulatory Visit (INDEPENDENT_AMBULATORY_CARE_PROVIDER_SITE_OTHER): Payer: Medicare Other | Admitting: Family Medicine

## 2016-02-25 VITALS — BP 148/69 | HR 94 | Temp 98.4°F | Ht 63.0 in | Wt 175.0 lb

## 2016-02-25 DIAGNOSIS — I4891 Unspecified atrial fibrillation: Secondary | ICD-10-CM

## 2016-02-25 DIAGNOSIS — I10 Essential (primary) hypertension: Secondary | ICD-10-CM

## 2016-02-25 DIAGNOSIS — Z952 Presence of prosthetic heart valve: Secondary | ICD-10-CM

## 2016-02-25 LAB — COAGUCHEK XS/INR WAIVED
INR: 4 — AB (ref 0.9–1.1)
PROTHROMBIN TIME: 48.1 s

## 2016-02-25 MED ORDER — WARFARIN SODIUM 2.5 MG PO TABS: ORAL_TABLET | ORAL | Status: DC

## 2016-02-25 MED ORDER — WARFARIN SODIUM 1 MG PO TABS: ORAL_TABLET | ORAL | Status: DC

## 2016-02-25 MED ORDER — WARFARIN SODIUM 4 MG PO TABS: ORAL_TABLET | ORAL | Status: DC

## 2016-02-25 NOTE — Assessment & Plan Note (Signed)
Discuss INR remaining high in spite of dietary changes will hold warfarin for one day changed to 3.5 alternating 4 mg recheck INR 2 weeks Cautions about bleeding

## 2016-02-25 NOTE — Assessment & Plan Note (Signed)
Discuss elevated blood pressure will observe if remains high will need to adjust medications.

## 2016-02-25 NOTE — Progress Notes (Signed)
   BP 148/69 mmHg  Pulse 94  Temp(Src) 98.4 F (36.9 C)  Ht '5\' 3"'$  (1.6 m)  Wt 175 lb (79.379 kg)  BMI 31.01 kg/m2  SpO2 94%   Subjective:    [name], male    DOB: 07-01-1941, 75 y.o.   MRN: [id number]  HPI: Kirk Jones is a 75 y.o. male  Chief Complaint  Patient presents with  . Atrial Fibrillation   doing well no complaints still has bruising on his arms but otherwise doing well taking warfarin trying to increase his greens held warfarin for one day. No blood in stool or urine  Relevant past medical, surgical, family and social history reviewed and updated as indicated. Interim medical history since our last visit reviewed. Allergies and medications reviewed and updated.  Review of Systems  Constitutional: Negative.   Respiratory: Negative.   Cardiovascular: Negative.     Per HPI unless specifically indicated above     Objective:    BP 148/69 mmHg  Pulse 94  Temp(Src) 98.4 F (36.9 C)  Ht '5\' 3"'$  (1.6 m)  Wt 175 lb (79.379 kg)  BMI 31.01 kg/m2  SpO2 94%  Wt Readings from Last 3 Encounters:  02/25/16 175 lb (79.379 kg)  02/11/16 171 lb 12.8 oz (77.928 kg)  01/14/16 173 lb (78.472 kg)    Physical Exam  Constitutional: He is oriented to person, place, and time. He appears well-developed and well-nourished. No distress.  HENT:  Head: Normocephalic and atraumatic.  Right Ear: Hearing normal.  Left Ear: Hearing normal.  Nose: Nose normal.  Eyes: Conjunctivae and lids are normal. Right eye exhibits no discharge. Left eye exhibits no discharge. No scleral icterus.  Pulmonary/Chest: Effort normal. No respiratory distress.  Musculoskeletal: Normal range of motion.  Neurological: He is alert and oriented to person, place, and time.  Skin: Skin is intact. No rash noted.  A lot of bruising on arms  Psychiatric: He has a normal mood and affect. His speech is normal and behavior is normal. Judgment and thought content normal. Cognition and  memory are normal.    Results for orders placed or performed in visit on 02/11/16  CoaguChek XS/INR Waived  Result Value Ref Range   INR 3.8 (H) 0.9 - 1.1   Prothrombin Time 45.6 sec      Assessment & Plan:   Problem List Items Addressed This Visit      Cardiovascular and Mediastinum   Atrial fibrillation ([facility]) (Chronic)   Relevant Medications   warfarin (COUMADIN) 4 MG tablet   warfarin (COUMADIN) 2.5 MG tablet   warfarin (COUMADIN) 1 MG tablet   Other Relevant Orders   CoaguChek XS/INR Waived   Hypertension    Discuss elevated blood pressure will observe if remains high will need to adjust medications.      Relevant Medications   warfarin (COUMADIN) 4 MG tablet   warfarin (COUMADIN) 2.5 MG tablet   warfarin (COUMADIN) 1 MG tablet     Other   History of prosthetic heart valve - Primary (Chronic)    Discuss INR remaining high in spite of dietary changes will hold warfarin for one day changed to 3.5 alternating 4 mg recheck INR 2 weeks Cautions about bleeding      Relevant Orders   CoaguChek XS/INR Waived       Follow up plan: Return in about 2 weeks (around 03/10/2016) for PT INR.

## 2016-03-10 ENCOUNTER — Ambulatory Visit (INDEPENDENT_AMBULATORY_CARE_PROVIDER_SITE_OTHER): Payer: Medicare Other | Admitting: Family Medicine

## 2016-03-10 ENCOUNTER — Encounter: Payer: Self-pay | Admitting: Family Medicine

## 2016-03-10 VITALS — BP 127/70 | HR 98 | Temp 98.1°F | Ht 63.0 in | Wt 172.0 lb

## 2016-03-10 DIAGNOSIS — Z952 Presence of prosthetic heart valve: Secondary | ICD-10-CM

## 2016-03-10 DIAGNOSIS — I4891 Unspecified atrial fibrillation: Secondary | ICD-10-CM

## 2016-03-10 LAB — COAGUCHEK XS/INR WAIVED
INR: 2.8 — AB (ref 0.9–1.1)
Prothrombin Time: 33.5 s

## 2016-03-10 NOTE — Assessment & Plan Note (Signed)
Patient doing very well on current regimen will continue which is 3-1/2 mg alternating with 4 mg

## 2016-03-10 NOTE — Progress Notes (Signed)
   BP 127/70 mmHg  Pulse 98  Temp(Src) 98.1 F (36.7 C)  Ht '5\' 3"'$  (1.6 m)  Wt 172 lb (78.019 kg)  BMI 30.48 kg/m2  SpO2 97%   Subjective:    [name], male    DOB: 16-Sep-1941, 75 y.o.   MRN: [id number]  HPI: Kirk Jones is a 75 y.o. male  Chief Complaint  Patient presents with  . Anticoagulation  Patient feeling much better bruising is starting to clear up no further new bruising taking medications faithfully is trying to eat a consistent diet  Relevant past medical, surgical, family and social history reviewed and updated as indicated. Interim medical history since our last visit reviewed. Allergies and medications reviewed and updated.  Review of Systems  Per HPI unless specifically indicated above     Objective:    BP 127/70 mmHg  Pulse 98  Temp(Src) 98.1 F (36.7 C)  Ht '5\' 3"'$  (1.6 m)  Wt 172 lb (78.019 kg)  BMI 30.48 kg/m2  SpO2 97%  Wt Readings from Last 3 Encounters:  03/10/16 172 lb (78.019 kg)  02/25/16 175 lb (79.379 kg)  02/11/16 171 lb 12.8 oz (77.928 kg)    Physical Exam  Results for orders placed or performed in visit on 02/25/16  CoaguChek XS/INR Waived  Result Value Ref Range   INR 4.0 (H) 0.9 - 1.1   Prothrombin Time 48.1 sec      Assessment & Plan:   Problem List Items Addressed This Visit      Cardiovascular and Mediastinum   Atrial fibrillation ([place]) - Primary (Chronic)   Relevant Orders   CoaguChek XS/INR Waived     Other   History of prosthetic heart valve (Chronic)    Patient doing very well on current regimen will continue which is 3-1/2 mg alternating with 4 mg      Relevant Orders   CoaguChek XS/INR Waived       Follow up plan: Return in about 4 weeks (around 04/07/2016) for PT INR.

## 2016-03-15 DIAGNOSIS — I10 Essential (primary) hypertension: Secondary | ICD-10-CM | POA: Diagnosis not present

## 2016-03-15 DIAGNOSIS — R600 Localized edema: Secondary | ICD-10-CM | POA: Diagnosis not present

## 2016-03-15 DIAGNOSIS — N183 Chronic kidney disease, stage 3 (moderate): Secondary | ICD-10-CM | POA: Diagnosis not present

## 2016-03-15 DIAGNOSIS — D472 Monoclonal gammopathy: Secondary | ICD-10-CM | POA: Diagnosis not present

## 2016-04-05 ENCOUNTER — Encounter: Payer: Self-pay | Admitting: Family Medicine

## 2016-04-05 ENCOUNTER — Ambulatory Visit (INDEPENDENT_AMBULATORY_CARE_PROVIDER_SITE_OTHER): Payer: Medicare Other | Admitting: Family Medicine

## 2016-04-05 VITALS — BP 142/72 | HR 98 | Temp 98.2°F | Wt 172.0 lb

## 2016-04-05 DIAGNOSIS — N183 Chronic kidney disease, stage 3 unspecified: Secondary | ICD-10-CM

## 2016-04-05 DIAGNOSIS — I10 Essential (primary) hypertension: Secondary | ICD-10-CM | POA: Diagnosis not present

## 2016-04-05 DIAGNOSIS — I482 Chronic atrial fibrillation, unspecified: Secondary | ICD-10-CM

## 2016-04-05 DIAGNOSIS — M25512 Pain in left shoulder: Secondary | ICD-10-CM | POA: Diagnosis not present

## 2016-04-05 DIAGNOSIS — Z952 Presence of prosthetic heart valve: Secondary | ICD-10-CM

## 2016-04-05 LAB — COAGUCHEK XS/INR WAIVED
INR: 4 — AB (ref 0.9–1.1)
Prothrombin Time: 47.6 s

## 2016-04-05 NOTE — Assessment & Plan Note (Signed)
The current medical regimen is effective;  continue present plan and medications.  

## 2016-04-05 NOTE — Assessment & Plan Note (Signed)
INR elevated today but will not change medications will continue 3-1/2 alternating 4 and patient will do better with diet is that's what's controlled the best on chart review. When lowering dose gets too low and states too low.

## 2016-04-05 NOTE — Progress Notes (Signed)
BP 142/72 mmHg  Pulse 98  Temp(Src) 98.2 F (36.8 C)  Wt 172 lb (78.019 kg)  SpO2 99%   Subjective:    [name], male    DOB: Oct 13, 1941, 75 y.o.   MRN: [id number]  HPI: Kirk Jones is a 75 y.o. male  Chief Complaint  Patient presents with  . Hypertension    follow up  . Atrial Fibrillation    currently on '4mg'$  x 4 days a week; 3.'5mg'$  x 3 days a week.  . Gout    he finished the colcrys  Pressure doing well no complaints takes medications faithfully without side effects Atrial fibrillation taken 3-1/2 alternating 4 mg with INR values elevated today but patient's been on a unusual diet is been at the beach. On review patient's mostly controlled on 3-1/2 alternating 4 with sometimes elevated excursions with change in diet. No bruising bleeding issues Patient concerned he might have some gout symptoms in his left shoulder over the last 3 months of left shoulder pain that on reflections more likely torn rotator cuff he had on his right shoulder years ago. Hasn't gotten better wakes him at night feels that it's time to do something as his not improving.   Relevant past medical, surgical, family and social history reviewed and updated as indicated. Interim medical history since our last visit reviewed. Allergies and medications reviewed and updated.  Other than noted above Review of Systems  Constitutional: Negative.   Respiratory: Negative.   Cardiovascular: Negative.     Per HPI unless specifically indicated above     Objective:    BP 142/72 mmHg  Pulse 98  Temp(Src) 98.2 F (36.8 C)  Wt 172 lb (78.019 kg)  SpO2 99%  Wt Readings from Last 3 Encounters:  04/05/16 172 lb (78.019 kg)  03/10/16 172 lb (78.019 kg)  02/25/16 175 lb (79.379 kg)    Physical Exam  Constitutional: He is oriented to person, place, and time. He appears well-developed and well-nourished. No distress.  HENT:  Head: Normocephalic and atraumatic.  Right Ear: Hearing  normal.  Left Ear: Hearing normal.  Nose: Nose normal.  Eyes: Conjunctivae and lids are normal. Right eye exhibits no discharge. Left eye exhibits no discharge. No scleral icterus.  Cardiovascular: Normal rate and normal heart sounds.   Pulmonary/Chest: Effort normal and breath sounds normal. No respiratory distress.  Musculoskeletal: Normal range of motion.  Left shoulder obvious tenderness with range of motion  Neurological: He is alert and oriented to person, place, and time.  Skin: Skin is intact. No rash noted.  Psychiatric: He has a normal mood and affect. His speech is normal and behavior is normal. Judgment and thought content normal. Cognition and memory are normal.    Results for orders placed or performed in visit on 03/10/16  CoaguChek XS/INR Waived  Result Value Ref Range   INR 2.8 (H) 0.9 - 1.1   Prothrombin Time 33.5 sec      Assessment & Plan:   Problem List Items Addressed This Visit      Cardiovascular and Mediastinum   Atrial fibrillation ([facility]) (Chronic)    INR elevated today but will not change medications will continue 3-1/2 alternating 4 and patient will do better with diet is that's what's controlled the best on chart review. When lowering dose gets too low and states too low.      Relevant Orders   CBC with Differential/Platelet   CoaguChek XS/INR Waived (STAT)   Hypertension -  Primary    The current medical regimen is effective;  continue present plan and medications.       Relevant Orders   Basic Metabolic Panel (BMET)     Genitourinary   CKD (chronic kidney disease), stage III   Relevant Orders   Basic Metabolic Panel (BMET)     Other   History of prosthetic heart valve (Chronic)   Relevant Orders   CBC with Differential/Platelet   CoaguChek XS/INR Waived (STAT)    Other Visit Diagnoses    Shoulder pain, acute, left        Because of persistent nature of shoulder pain will refer to orthopedics to further evaluate    Relevant Orders     Ambulatory referral to Orthopedic Surgery        Follow up plan: Return in about 4 weeks (around 05/03/2016) for PT INR.

## 2016-04-06 ENCOUNTER — Encounter: Payer: Self-pay | Admitting: Family Medicine

## 2016-04-06 DIAGNOSIS — I480 Paroxysmal atrial fibrillation: Secondary | ICD-10-CM | POA: Diagnosis not present

## 2016-04-06 LAB — CBC WITH DIFFERENTIAL/PLATELET
Basophils Absolute: 0.1 10*3/uL (ref 0.0–0.2)
Basos: 3 %
EOS (ABSOLUTE): 0.1 10*3/uL (ref 0.0–0.4)
EOS: 2 %
HEMATOCRIT: 41.6 % (ref 37.5–51.0)
HEMOGLOBIN: 13.8 g/dL (ref 12.6–17.7)
IMMATURE GRANS (ABS): 0 10*3/uL (ref 0.0–0.1)
IMMATURE GRANULOCYTES: 0 %
LYMPHS ABS: 1 10*3/uL (ref 0.7–3.1)
Lymphs: 22 %
MCH: 33.3 pg — ABNORMAL HIGH (ref 26.6–33.0)
MCHC: 33.2 g/dL (ref 31.5–35.7)
MCV: 100 fL — ABNORMAL HIGH (ref 79–97)
MONOS ABS: 0.5 10*3/uL (ref 0.1–0.9)
Monocytes: 13 %
Neutrophils Absolute: 2.6 10*3/uL (ref 1.4–7.0)
Neutrophils: 60 %
Platelets: 280 10*3/uL (ref 150–379)
RBC: 4.15 x10E6/uL (ref 4.14–5.80)
RDW: 14.4 % (ref 12.3–15.4)
WBC: 4.3 10*3/uL (ref 3.4–10.8)

## 2016-04-06 LAB — BASIC METABOLIC PANEL
BUN / CREAT RATIO: 16 (ref 10–24)
BUN: 17 mg/dL (ref 8–27)
CALCIUM: 8.8 mg/dL (ref 8.6–10.2)
CHLORIDE: 98 mmol/L (ref 96–106)
CO2: 26 mmol/L (ref 18–29)
CREATININE: 1.04 mg/dL (ref 0.76–1.27)
GFR calc non Af Amer: 70 mL/min/{1.73_m2} (ref >59)
GFR, EST AFRICAN AMERICAN: 81 mL/min/{1.73_m2} (ref >59)
GLUCOSE: 133 mg/dL — AB (ref 65–99)
POTASSIUM: 4.2 mmol/L (ref 3.5–5.2)
Sodium: 142 mmol/L (ref 134–144)

## 2016-04-15 DIAGNOSIS — I472 Ventricular tachycardia: Secondary | ICD-10-CM | POA: Diagnosis not present

## 2016-04-15 DIAGNOSIS — Z9861 Coronary angioplasty status: Secondary | ICD-10-CM | POA: Diagnosis not present

## 2016-04-15 DIAGNOSIS — I481 Persistent atrial fibrillation: Secondary | ICD-10-CM | POA: Diagnosis not present

## 2016-04-15 DIAGNOSIS — Z952 Presence of prosthetic heart valve: Secondary | ICD-10-CM | POA: Diagnosis not present

## 2016-04-15 DIAGNOSIS — I5022 Chronic systolic (congestive) heart failure: Secondary | ICD-10-CM | POA: Diagnosis not present

## 2016-04-15 DIAGNOSIS — I255 Ischemic cardiomyopathy: Secondary | ICD-10-CM | POA: Diagnosis not present

## 2016-04-15 DIAGNOSIS — Z8673 Personal history of transient ischemic attack (TIA), and cerebral infarction without residual deficits: Secondary | ICD-10-CM | POA: Diagnosis not present

## 2016-04-15 DIAGNOSIS — I10 Essential (primary) hypertension: Secondary | ICD-10-CM | POA: Diagnosis not present

## 2016-04-15 DIAGNOSIS — Z95810 Presence of automatic (implantable) cardiac defibrillator: Secondary | ICD-10-CM | POA: Diagnosis not present

## 2016-04-15 DIAGNOSIS — J410 Simple chronic bronchitis: Secondary | ICD-10-CM | POA: Diagnosis not present

## 2016-04-15 DIAGNOSIS — E7800 Pure hypercholesterolemia, unspecified: Secondary | ICD-10-CM | POA: Diagnosis not present

## 2016-04-15 DIAGNOSIS — I2510 Atherosclerotic heart disease of native coronary artery without angina pectoris: Secondary | ICD-10-CM | POA: Diagnosis not present

## 2016-04-16 DIAGNOSIS — M7542 Impingement syndrome of left shoulder: Secondary | ICD-10-CM | POA: Diagnosis not present

## 2016-04-19 ENCOUNTER — Telehealth: Payer: Self-pay | Admitting: Family Medicine

## 2016-04-19 ENCOUNTER — Encounter: Payer: Self-pay | Admitting: Family Medicine

## 2016-04-19 ENCOUNTER — Ambulatory Visit (INDEPENDENT_AMBULATORY_CARE_PROVIDER_SITE_OTHER): Payer: Medicare Other | Admitting: Family Medicine

## 2016-04-19 VITALS — BP 145/78 | HR 108 | Temp 98.2°F | Ht 63.0 in | Wt 166.0 lb

## 2016-04-19 DIAGNOSIS — I482 Chronic atrial fibrillation, unspecified: Secondary | ICD-10-CM

## 2016-04-19 DIAGNOSIS — Z952 Presence of prosthetic heart valve: Secondary | ICD-10-CM | POA: Diagnosis not present

## 2016-04-19 LAB — COAGUCHEK XS/INR WAIVED
INR: 4.9 — AB (ref 0.9–1.1)
PROTHROMBIN TIME: 58.7 s

## 2016-04-19 NOTE — Assessment & Plan Note (Signed)
Discuss warfarin will hold today and tomorrow [3.5 mg a day discuss bruising injections check an INR prior to any future injections. Recheck INR 2 weeks

## 2016-04-19 NOTE — Telephone Encounter (Signed)
Pt called stated he would like to have his PT INR ASAP. Pt stated he is bruising all down his arm and it's getting worse. Pt stated he got an injection in his shoulder and he is now bruised from his shoulder to his elbow. Please call pt ASAP. Pt received injection Friday afternoon. Thanks.

## 2016-04-19 NOTE — Telephone Encounter (Signed)
Have pt come in today plz  Pt called stated he would like to have his PT INR ASAP. Pt stated he is bruising all down his arm and it's getting worse. Pt stated he got an injection in his shoulder and he is now bruised from his shoulder to his elbow. Please call pt ASAP. Pt received injection Friday afternoon. Thanks.

## 2016-04-19 NOTE — Progress Notes (Signed)
BP 145/78 mmHg  Pulse 108  Temp(Src) 98.2 F (36.8 C)  Ht '5\' 3"'$  (1.6 m)  Wt 166 lb (75.297 kg)  BMI 29.41 kg/m2  SpO2 99%   Subjective:    [name], male    DOB: [birth year], 75 y.o.   MRN: [id number]  HPI: Kirk Jones is a 75 y.o. male  Chief Complaint  Patient presents with  . Atrial Fibrillation   Patient with atrial fibrillation taken taken warfarin with INR goal 2-1/2-3-1/2 got a shot in his left shoulder 3 days ago and INR was unknowingly high patient with marked bruising and pain developed shortly afterwards spreading down into his left medial elbow all through his left triceps area. Patient also with much more bruising on both lower arms. No noticed bleeding bowel or her bladder Patient with no change in warfarin taken 3-1/2 alternating 4 mg daily No change in diet Relevant past medical, surgical, family and social history reviewed and updated as indicated. Interim medical history since our last visit reviewed. Allergies and medications reviewed and updated.  Review of Systems  Constitutional: Negative.   Respiratory: Negative.   Cardiovascular: Negative.     Per HPI unless specifically indicated above     Objective:    BP 145/78 mmHg  Pulse 108  Temp(Src) 98.2 F (36.8 C)  Ht '5\' 3"'$  (1.6 m)  Wt 166 lb (75.297 kg)  BMI 29.41 kg/m2  SpO2 99%  Wt Readings from Last 3 Encounters:  04/19/16 166 lb (75.297 kg)  04/05/16 172 lb (78.019 kg)  03/10/16 172 lb (78.019 kg)    Physical Exam  Constitutional: He is oriented to person, place, and time. He appears well-developed and well-nourished. No distress.  HENT:  Head: Normocephalic and atraumatic.  Right Ear: Hearing normal.  Left Ear: Hearing normal.  Nose: Nose normal.  Eyes: Conjunctivae and lids are normal. Right eye exhibits no discharge. Left eye exhibits no discharge. No scleral icterus.  Pulmonary/Chest: Effort normal. No respiratory distress.  Musculoskeletal: Normal  range of motion.  Neurological: He is alert and oriented to person, place, and time.  Skin: Skin is intact. No rash noted.  Bruising is noted above  Psychiatric: He has a normal mood and affect. His speech is normal and behavior is normal. Judgment and thought content normal. Cognition and memory are normal.    Results for orders placed or performed in visit on 04/05/16  CBC with Differential/Platelet  Result Value Ref Range   WBC 4.3 3.4 - 10.8 x10E3/uL   RBC 4.15 4.14 - 5.80 x10E6/uL   Hemoglobin 13.8 12.6 - 17.7 g/dL   Hematocrit 41.6 37.5 - 51.0 %   MCV 100 (H) 79 - 97 fL   MCH 33.3 (H) 26.6 - 33.0 pg   MCHC 33.2 31.5 - 35.7 g/dL   RDW 14.4 12.3 - 15.4 %   Platelets 280 150 - 379 x10E3/uL   Neutrophils 60 %   Lymphs 22 %   Monocytes 13 %   Eos 2 %   Basos 3 %   Neutrophils Absolute 2.6 1.4 - 7.0 x10E3/uL   Lymphocytes Absolute 1.0 0.7 - 3.1 x10E3/uL   Monocytes Absolute 0.5 0.1 - 0.9 x10E3/uL   EOS (ABSOLUTE) 0.1 0.0 - 0.4 x10E3/uL   Basophils Absolute 0.1 0.0 - 0.2 x10E3/uL   Immature Granulocytes 0 %   Immature Grans (Abs) 0.0 0.0 - 0.1 G29B2/WU  Basic Metabolic Panel (BMET)  Result Value Ref Range   Glucose 133 (  H) 65 - 99 mg/dL   BUN 17 8 - 27 mg/dL   Creatinine, Ser 1.04 0.76 - 1.27 mg/dL   GFR calc non Af Amer 70 >59 mL/min/1.73   GFR calc Af Amer 81 >59 mL/min/1.73   BUN/Creatinine Ratio 16 10 - 24   Sodium 142 134 - 144 mmol/L   Potassium 4.2 3.5 - 5.2 mmol/L   Chloride 98 96 - 106 mmol/L   CO2 26 18 - 29 mmol/L   Calcium 8.8 8.6 - 10.2 mg/dL  CoaguChek XS/INR Waived (STAT)  Result Value Ref Range   INR 4.0 (H) 0.9 - 1.1   Prothrombin Time 47.6 sec      Assessment & Plan:   Problem List Items Addressed This Visit      Cardiovascular and Mediastinum   Atrial fibrillation (HCC) - Primary (Chronic)    Discuss warfarin will hold today and tomorrow [3.5 mg a day discuss bruising injections check an INR prior to any future injections. Recheck INR 2  weeks       Relevant Orders   CoaguChek XS/INR Waived     Other   History of prosthetic heart valve (Chronic)   Relevant Orders   CoaguChek XS/INR Waived       Follow up plan: Return in about 2 weeks (around 05/03/2016) for PT INR.

## 2016-05-05 ENCOUNTER — Ambulatory Visit (INDEPENDENT_AMBULATORY_CARE_PROVIDER_SITE_OTHER): Payer: Medicare Other | Admitting: Family Medicine

## 2016-05-05 ENCOUNTER — Encounter: Payer: Self-pay | Admitting: Family Medicine

## 2016-05-05 VITALS — BP 131/65 | HR 90 | Temp 98.6°F | Wt 168.0 lb

## 2016-05-05 DIAGNOSIS — I5022 Chronic systolic (congestive) heart failure: Secondary | ICD-10-CM | POA: Diagnosis not present

## 2016-05-05 DIAGNOSIS — Z952 Presence of prosthetic heart valve: Secondary | ICD-10-CM | POA: Diagnosis not present

## 2016-05-05 DIAGNOSIS — I482 Chronic atrial fibrillation, unspecified: Secondary | ICD-10-CM

## 2016-05-05 DIAGNOSIS — I255 Ischemic cardiomyopathy: Secondary | ICD-10-CM | POA: Diagnosis not present

## 2016-05-05 DIAGNOSIS — I10 Essential (primary) hypertension: Secondary | ICD-10-CM

## 2016-05-05 DIAGNOSIS — R0602 Shortness of breath: Secondary | ICD-10-CM | POA: Diagnosis not present

## 2016-05-05 DIAGNOSIS — I2510 Atherosclerotic heart disease of native coronary artery without angina pectoris: Secondary | ICD-10-CM | POA: Diagnosis not present

## 2016-05-05 LAB — COAGUCHEK XS/INR WAIVED
INR: 3.7 — AB (ref 0.9–1.1)
PROTHROMBIN TIME: 44.9 s

## 2016-05-05 NOTE — Assessment & Plan Note (Signed)
Patient doing well and much better INR down to 3.7 with medicine change down to 3.5 mg of warfarin a day we will continue 3.5 mg continue dietary modification and recheck 1 month sooner if problems.

## 2016-05-05 NOTE — Assessment & Plan Note (Signed)
The current medical regimen is effective;  continue present plan and medications.  

## 2016-05-05 NOTE — Progress Notes (Signed)
   BP 131/65 mmHg  Pulse 90  Temp(Src) 98.6 F (37 C)  Wt 168 lb (76.204 kg)  SpO2 95%   Subjective:    [name], male    DOB: November 21, 1941, 75 y.o.   MRN: [id number]  HPI: Kirk Jones is a 75 y.o. male  Chief Complaint  Patient presents with  . Atrial Fibrillation    pt/inr check   Patient all in all doing well bruising is largely resolved INR down below for further first time in several visits. Taking 3-1/2 mg of warfarin a day. Bruising is better and resolving. Patient with no known other bleeding issues. Blood pressure doing well no complaints from medications taking medications faithfully without problems side effects  Relevant past medical, surgical, family and social history reviewed and updated as indicated. Interim medical history since our last visit reviewed. Allergies and medications reviewed and updated.  Review of Systems  Constitutional: Negative.   Respiratory: Negative.   Cardiovascular: Negative.     Per HPI unless specifically indicated above     Objective:    BP 131/65 mmHg  Pulse 90  Temp(Src) 98.6 F (37 C)  Wt 168 lb (76.204 kg)  SpO2 95%  Wt Readings from Last 3 Encounters:  05/05/16 168 lb (76.204 kg)  04/19/16 166 lb (75.297 kg)  04/05/16 172 lb (78.019 kg)    Physical Exam  Constitutional: He is oriented to person, place, and time. He appears well-developed and well-nourished. No distress.  HENT:  Head: Normocephalic and atraumatic.  Right Ear: Hearing normal.  Left Ear: Hearing normal.  Nose: Nose normal.  Eyes: Conjunctivae and lids are normal. Right eye exhibits no discharge. Left eye exhibits no discharge. No scleral icterus.  Cardiovascular: Normal rate.   Pulmonary/Chest: Effort normal and breath sounds normal. No respiratory distress.  Musculoskeletal: Normal range of motion.  Neurological: He is alert and oriented to person, place, and time.  Skin: Skin is intact. No rash noted.  Psychiatric: He  has a normal mood and affect. His speech is normal and behavior is normal. Judgment and thought content normal. Cognition and memory are normal.    Results for orders placed or performed in visit on 04/19/16  CoaguChek XS/INR Waived  Result Value Ref Range   INR 4.9 (H) 0.9 - 1.1   Prothrombin Time 58.7 sec      Assessment & Plan:   Problem List Items Addressed This Visit      Cardiovascular and Mediastinum   Atrial fibrillation ([place]) - Primary (Chronic)   Relevant Orders   CoaguChek XS/INR Waived   Hypertension    The current medical regimen is effective;  continue present plan and medications.         Other   History of prosthetic heart valve (Chronic)    Patient doing well and much better INR down to 3.7 with medicine change down to 3.5 mg of warfarin a day we will continue 3.5 mg continue dietary modification and recheck 1 month sooner if problems.          Follow up plan: Return in about 4 weeks (around 06/02/2016) for PT INR.

## 2016-05-24 ENCOUNTER — Other Ambulatory Visit: Payer: Self-pay | Admitting: Family Medicine

## 2016-05-24 MED ORDER — WARFARIN SODIUM 1 MG PO TABS: ORAL_TABLET | ORAL | Status: DC

## 2016-05-24 NOTE — Telephone Encounter (Signed)
1 mg tablets ordered in separate encounter

## 2016-06-02 ENCOUNTER — Other Ambulatory Visit: Payer: Self-pay | Admitting: Family Medicine

## 2016-06-02 ENCOUNTER — Ambulatory Visit (INDEPENDENT_AMBULATORY_CARE_PROVIDER_SITE_OTHER): Payer: Medicare Other | Admitting: Family Medicine

## 2016-06-02 ENCOUNTER — Encounter: Payer: Self-pay | Admitting: Family Medicine

## 2016-06-02 DIAGNOSIS — Z952 Presence of prosthetic heart valve: Secondary | ICD-10-CM

## 2016-06-02 LAB — COAGUCHEK XS/INR WAIVED
INR: 3 — ABNORMAL HIGH (ref 0.9–1.1)
PROTHROMBIN TIME: 35.6 s

## 2016-06-02 NOTE — Progress Notes (Signed)
BP 149/80 mmHg  Pulse 98  Temp(Src) 98.2 F (36.8 C)  Ht '5\' 3"'$  (1.6 m)  Wt 168 lb (76.204 kg)  BMI 29.77 kg/m2  SpO2 99%   Subjective:    [name], male    DOB: 21-Jul-1941, 75 y.o.   MRN: [id number]  CC: Coumadin management  HPI: This patient is a 75 y.o. male who presents for coumadin management. The expected duration of coumadin treatment is lifelong The reason for anticoagulation is  mechanical heart valve.  Present Coumadin dose: 3.'5mg'$  daily Goal: 2.5-3.5  Excessive bruising: no Nose bleeding: no Rectal bleeding: no Prolonged menstrual cycles: N/A Eating diet with consistent amounts of foods containing Vitamin K:yes Any recent antibiotic use? no  Relevant past medical, surgical, family and social history reviewed and updated as indicated. Interim medical history since our last visit reviewed. Allergies and medications reviewed and updated.  ROS: Per HPI unless specifically indicated above     Objective:    BP 149/80 mmHg  Pulse 98  Temp(Src) 98.2 F (36.8 C)  Ht '5\' 3"'$  (1.6 m)  Wt 168 lb (76.204 kg)  BMI 29.77 kg/m2  SpO2 99%  Wt Readings from Last 3 Encounters:  06/02/16 168 lb (76.204 kg)  05/05/16 168 lb (76.204 kg)  04/19/16 166 lb (75.297 kg)    General: Well appearing, well nourished in no distress.  Normal mood and affect. Skin: No excessive bruising or rash  Last INR: 3.0 Last PT:  35.6    Last CBC:  Lab Results  Component Value Date   WBC 4.3 04/05/2016   HGB 10.2* 11/20/2014   HCT 41.6 04/05/2016   MCV 100* 04/05/2016   PLT 280 04/05/2016    Results for orders placed or performed in visit on 05/05/16  CoaguChek XS/INR Waived  Result Value Ref Range   INR 3.7 (H) 0.9 - 1.1   Prothrombin Time 44.9 sec       Assessment:     ICD-9-CM ICD-10-CM   1. History of prosthetic heart valve V43.3 Z95.2 CoaguChek XS/INR Waived    Plan:   Discussed current plan face-to-face with patient. For coumadin dosing, elected to  continue current dose. Will plan to recheck INR in 1 month.

## 2016-06-11 DIAGNOSIS — H401130 Primary open-angle glaucoma, bilateral, stage unspecified: Secondary | ICD-10-CM | POA: Diagnosis not present

## 2016-06-11 DIAGNOSIS — H2513 Age-related nuclear cataract, bilateral: Secondary | ICD-10-CM | POA: Diagnosis not present

## 2016-06-22 ENCOUNTER — Other Ambulatory Visit: Payer: Self-pay | Admitting: Family Medicine

## 2016-06-25 DIAGNOSIS — H25041 Posterior subcapsular polar age-related cataract, right eye: Secondary | ICD-10-CM | POA: Diagnosis not present

## 2016-06-25 DIAGNOSIS — H2511 Age-related nuclear cataract, right eye: Secondary | ICD-10-CM | POA: Diagnosis not present

## 2016-06-25 DIAGNOSIS — H25012 Cortical age-related cataract, left eye: Secondary | ICD-10-CM | POA: Diagnosis not present

## 2016-06-25 DIAGNOSIS — H25011 Cortical age-related cataract, right eye: Secondary | ICD-10-CM | POA: Diagnosis not present

## 2016-06-25 DIAGNOSIS — H2512 Age-related nuclear cataract, left eye: Secondary | ICD-10-CM | POA: Diagnosis not present

## 2016-06-25 DIAGNOSIS — H40013 Open angle with borderline findings, low risk, bilateral: Secondary | ICD-10-CM | POA: Diagnosis not present

## 2016-07-01 ENCOUNTER — Encounter: Payer: Self-pay | Admitting: Family Medicine

## 2016-07-01 ENCOUNTER — Ambulatory Visit (INDEPENDENT_AMBULATORY_CARE_PROVIDER_SITE_OTHER): Payer: Medicare Other | Admitting: Family Medicine

## 2016-07-01 VITALS — BP 121/74 | HR 97 | Wt 169.0 lb

## 2016-07-01 DIAGNOSIS — Z952 Presence of prosthetic heart valve: Secondary | ICD-10-CM

## 2016-07-01 DIAGNOSIS — I480 Paroxysmal atrial fibrillation: Secondary | ICD-10-CM

## 2016-07-01 DIAGNOSIS — I10 Essential (primary) hypertension: Secondary | ICD-10-CM

## 2016-07-01 LAB — COAGUCHEK XS/INR WAIVED
INR: 3.1 — AB (ref 0.9–1.1)
Prothrombin Time: 37.6 s

## 2016-07-01 NOTE — Assessment & Plan Note (Signed)
The current medical regimen is effective;  continue present plan and medications.  

## 2016-07-01 NOTE — Progress Notes (Signed)
   BP 121/74 (BP Location: Left Arm, Patient Position: Sitting, Cuff Size: Normal)   Pulse 97   Wt 169 lb (76.7 kg) Comment: with shoes  SpO2 97%   BMI 29.94 kg/m    Subjective:    [name], male    DOB: 28-Dec-1940, 75 y.o.   MRN: [id number]  HPI: Kirk Jones is a 75 y.o. male  Chief Complaint  Patient presents with  . Anticoagulation   Patient doing well with no excessive bruising no complaints from medications no obvious bleeding and taking medications faithfully without problems  Discussed patient with a lot of stress with his wife some memory loss and combative behavior discussed strategies HIPA form signed and have full evaluation for memory  Hypertension good control good reports from cardiology Relevant past medical, surgical, family and social history reviewed and updated as indicated. Interim medical history since our last visit reviewed. Allergies and medications reviewed and updated.  Review of Systems  Constitutional: Negative.   Respiratory: Negative.   Cardiovascular: Negative.     Per HPI unless specifically indicated above     Objective:    BP 121/74 (BP Location: Left Arm, Patient Position: Sitting, Cuff Size: Normal)   Pulse 97   Wt 169 lb (76.7 kg) Comment: with shoes  SpO2 97%   BMI 29.94 kg/m   Wt Readings from Last 3 Encounters:  07/01/16 169 lb (76.7 kg)  06/02/16 168 lb (76.2 kg)  05/05/16 168 lb (76.2 kg)    Physical Exam  Constitutional: He is oriented to person, place, and time. He appears well-developed and well-nourished. No distress.  HENT:  Head: Normocephalic and atraumatic.  Right Ear: Hearing normal.  Left Ear: Hearing normal.  Nose: Nose normal.  Eyes: Conjunctivae and lids are normal. Right eye exhibits no discharge. Left eye exhibits no discharge. No scleral icterus.  Cardiovascular: Exam reveals no gallop and no friction rub.   Pulmonary/Chest: Effort normal and breath sounds normal.    Musculoskeletal: Normal range of motion.  Neurological: He is alert and oriented to person, place, and time.  Skin: Skin is intact. No rash noted.  Psychiatric: He has a normal mood and affect. His speech is normal and behavior is normal. Judgment and thought content normal. Cognition and memory are normal.    Results for orders placed or performed in visit on 07/01/16  CoaguChek XS/INR Waived  Result Value Ref Range   INR 3.1 (H) 0.9 - 1.1   Prothrombin Time 37.6 sec      Assessment & Plan:   Problem List Items Addressed This Visit      Cardiovascular and Mediastinum   Atrial fibrillation ([place]) (Chronic)    The current medical regimen is effective;  continue present plan and medications.       Hypertension    The current medical regimen is effective;  continue present plan and medications.         Other   History of prosthetic heart valve - Primary (Chronic)   Relevant Orders   CoaguChek XS/INR Waived (Completed)    Other Visit Diagnoses   None.      Follow up plan: Return in about 4 weeks (around 07/29/2016) for PT INR.

## 2016-07-06 DIAGNOSIS — H25011 Cortical age-related cataract, right eye: Secondary | ICD-10-CM | POA: Diagnosis not present

## 2016-07-06 DIAGNOSIS — I480 Paroxysmal atrial fibrillation: Secondary | ICD-10-CM | POA: Diagnosis not present

## 2016-07-06 DIAGNOSIS — H25041 Posterior subcapsular polar age-related cataract, right eye: Secondary | ICD-10-CM | POA: Diagnosis not present

## 2016-07-06 DIAGNOSIS — H2511 Age-related nuclear cataract, right eye: Secondary | ICD-10-CM | POA: Diagnosis not present

## 2016-07-19 ENCOUNTER — Other Ambulatory Visit: Payer: Self-pay | Admitting: Unknown Physician Specialty

## 2016-07-19 NOTE — Telephone Encounter (Signed)
Your patient 

## 2016-07-30 ENCOUNTER — Encounter: Payer: Self-pay | Admitting: Family Medicine

## 2016-07-30 ENCOUNTER — Ambulatory Visit (INDEPENDENT_AMBULATORY_CARE_PROVIDER_SITE_OTHER): Payer: Medicare Other | Admitting: Family Medicine

## 2016-07-30 VITALS — BP 153/87 | HR 92 | Temp 98.8°F | Wt 166.0 lb

## 2016-07-30 DIAGNOSIS — I480 Paroxysmal atrial fibrillation: Secondary | ICD-10-CM

## 2016-07-30 DIAGNOSIS — Z952 Presence of prosthetic heart valve: Secondary | ICD-10-CM | POA: Diagnosis not present

## 2016-07-30 LAB — COAGUCHEK XS/INR WAIVED
INR: 4.2 — AB (ref 0.9–1.1)
Prothrombin Time: 49.9 s

## 2016-07-30 NOTE — Progress Notes (Signed)
BP (!) 153/87   Pulse 92   Temp 98.8 F (37.1 C)   Wt 166 lb (75.3 kg)   SpO2 96%   BMI 29.41 kg/m    Subjective:    [name], male    DOB: 10/13/1941, 75 y.o.   MRN: [id number]  HPI: Kirk Jones is a 75 y.o. male  Chief Complaint  Patient presents with  . Anticoagulation    taking 3.'5mg'$  qd   Patient presents for 1 month f/u PT/INR. On chronic coumadin therapy for mechanical heart valve.Taking 3.5 mg daily with no change in his bleeding/bruising. Had a recent episode where INR was too high and had a fair amount of bruising at that time. Was working in the yard yesterday and got a few new cuts on his extremities, but was able to control the bleeding with liquid bandaid. Only new medication is eye drops s/p cataract surgery a few weeks ago. Feels like diet has been stable.   Last INR was 3.1. Today it is 4.2.   Relevant past medical, surgical, family and social history reviewed and updated as indicated. Interim medical history since our last visit reviewed. Allergies and medications reviewed and updated.  Review of Systems  Constitutional: Negative.   HENT: Negative.   Respiratory: Negative.   Cardiovascular: Negative.   Gastrointestinal: Negative.   Genitourinary: Negative.   Musculoskeletal: Negative.   Skin: Negative.   Neurological: Negative.   Hematological: Bruises/bleeds easily (no more than normal).  Psychiatric/Behavioral: Negative.     Per HPI unless specifically indicated above     Objective:    BP (!) 153/87   Pulse 92   Temp 98.8 F (37.1 C)   Wt 166 lb (75.3 kg)   SpO2 96%   BMI 29.41 kg/m   Wt Readings from Last 3 Encounters:  07/30/16 166 lb (75.3 kg)  07/01/16 169 lb (76.7 kg)  06/02/16 168 lb (76.2 kg)    Physical Exam  Constitutional: He is oriented to person, place, and time. He appears well-developed and well-nourished.  HENT:  Head: Atraumatic.  Eyes: Conjunctivae are normal. No scleral icterus.    Neck: Normal range of motion. Neck supple.  Cardiovascular: Normal rate.   Pulmonary/Chest: Effort normal and breath sounds normal. No respiratory distress.  Musculoskeletal: Normal range of motion.  Neurological: He is alert and oriented to person, place, and time.  Skin: Skin is warm and dry.  Multiple large bruises on b/l arms, several stable healing cuts on legs. No active bleeding  Psychiatric: He has a normal mood and affect. His behavior is normal.  Nursing note and vitals reviewed.   Results for orders placed or performed in visit on 07/30/16  CoaguChek XS/INR Waived  Result Value Ref Range   INR 4.2 (H) 0.9 - 1.1   Prothrombin Time 49.9 sec      Assessment & Plan:   Problem List Items Addressed This Visit      Cardiovascular and Mediastinum   Atrial fibrillation ([place]) (Chronic)    lab      Relevant Orders   CoaguChek XS/INR Waived (Completed)     Other   History of prosthetic heart valve - Primary (Chronic)    Other Visit Diagnoses   None.     Will slightly modify dosing, 3.5 mg 5 days a week with 3 mg 2 days a week. Hold dose tonight, restart tomorrow. Follow up for recheck in 2 weeks to make sure there isn't overcorrection.  Follow up plan: Return in about 2 weeks (around 08/13/2016) for INR.

## 2016-07-30 NOTE — Assessment & Plan Note (Signed)
lab

## 2016-07-30 NOTE — Patient Instructions (Signed)
Follow up in 2 weeks, take 3.5 mg 5 days per week and 3 mg 2 days per week.

## 2016-08-04 DIAGNOSIS — H25012 Cortical age-related cataract, left eye: Secondary | ICD-10-CM | POA: Diagnosis not present

## 2016-08-04 DIAGNOSIS — H2512 Age-related nuclear cataract, left eye: Secondary | ICD-10-CM | POA: Diagnosis not present

## 2016-08-05 DIAGNOSIS — X32XXXA Exposure to sunlight, initial encounter: Secondary | ICD-10-CM | POA: Diagnosis not present

## 2016-08-05 DIAGNOSIS — R202 Paresthesia of skin: Secondary | ICD-10-CM | POA: Diagnosis not present

## 2016-08-05 DIAGNOSIS — Z85828 Personal history of other malignant neoplasm of skin: Secondary | ICD-10-CM | POA: Diagnosis not present

## 2016-08-05 DIAGNOSIS — D2261 Melanocytic nevi of right upper limb, including shoulder: Secondary | ICD-10-CM | POA: Diagnosis not present

## 2016-08-05 DIAGNOSIS — D225 Melanocytic nevi of trunk: Secondary | ICD-10-CM | POA: Diagnosis not present

## 2016-08-05 DIAGNOSIS — L570 Actinic keratosis: Secondary | ICD-10-CM | POA: Diagnosis not present

## 2016-08-09 ENCOUNTER — Ambulatory Visit (INDEPENDENT_AMBULATORY_CARE_PROVIDER_SITE_OTHER): Payer: Medicare Other | Admitting: Family Medicine

## 2016-08-09 ENCOUNTER — Encounter: Payer: Self-pay | Admitting: Family Medicine

## 2016-08-09 VITALS — BP 135/90 | HR 96 | Temp 98.3°F | Ht 64.6 in | Wt 165.6 lb

## 2016-08-09 DIAGNOSIS — Z952 Presence of prosthetic heart valve: Secondary | ICD-10-CM

## 2016-08-09 DIAGNOSIS — I480 Paroxysmal atrial fibrillation: Secondary | ICD-10-CM | POA: Diagnosis not present

## 2016-08-09 LAB — COAGUCHEK XS/INR WAIVED
INR: 2.2 — ABNORMAL HIGH (ref 0.9–1.1)
Prothrombin Time: 26.3 s

## 2016-08-09 NOTE — Assessment & Plan Note (Signed)
Continue 3.5 mg daily, limit greens. Recheck INR in 1 month

## 2016-08-09 NOTE — Progress Notes (Signed)
BP 135/90 (BP Location: Right Arm, Cuff Size: Normal)   Pulse 96   Temp 98.3 F (36.8 C)   Ht 5' 4.6" (1.641 m) Comment: pt had shoes on  Wt 165 lb 9.6 oz (75.1 kg) Comment: pt had shoes on  SpO2 92%   BMI 27.90 kg/m    Subjective:    [name], male    DOB: [birth year], 76 y.o.   MRN: [id number]  HPI: Cuthbert Turton is a 75 y.o. male  Chief Complaint  Patient presents with  . history of prosthetic heart valve  . Atrial Fibrillation    pt states he did not take warfarin the last day he was here, took 3 mg for the next 2 days, and has been taking 3.5 mg per day since then    Patient presents for 2 week INR f/u. INR previously was 4.2, so recommendation was to skip that night's dose and then begin 3 mg twice weekly and 3.5 mg daily the other 5 days. He misunderstood and did two days at 3 mg and since has been on 3.5 mg daily. Admits to eating much more greens than usual the past few weeks as well. No other medication or lifestyle changes. Bruising on forearms has improved some. No bleeding issues reported.   Relevant past medical, surgical, family and social history reviewed and updated as indicated. Interim medical history since our last visit reviewed. Allergies and medications reviewed and updated.  Review of Systems  Constitutional: Negative.   HENT: Negative.   Respiratory: Negative.   Cardiovascular: Negative.   Gastrointestinal: Negative.   Genitourinary: Negative.   Musculoskeletal: Negative.   Neurological: Negative.   Hematological: Does not bruise/bleed easily.  Psychiatric/Behavioral: Negative.     Per HPI unless specifically indicated above     Objective:    BP 135/90 (BP Location: Right Arm, Cuff Size: Normal)   Pulse 96   Temp 98.3 F (36.8 C)   Ht 5' 4.6" (1.641 m) Comment: pt had shoes on  Wt 165 lb 9.6 oz (75.1 kg) Comment: pt had shoes on  SpO2 92%   BMI 27.90 kg/m   Wt Readings from Last 3 Encounters:  08/09/16 165  lb 9.6 oz (75.1 kg)  07/30/16 166 lb (75.3 kg)  07/01/16 169 lb (76.7 kg)    Physical Exam  Constitutional: He is oriented to person, place, and time. He appears well-developed and well-nourished. No distress.  HENT:  Head: Atraumatic.  Eyes: Conjunctivae are normal. No scleral icterus.  Neck: Normal range of motion. Neck supple.  Cardiovascular: Normal rate.   Pulmonary/Chest: Effort normal. No respiratory distress.  Musculoskeletal: Normal range of motion.  Neurological: He is alert and oriented to person, place, and time.  Skin: Skin is warm and dry.  Psychiatric: He has a normal mood and affect. His behavior is normal.  Nursing note and vitals reviewed.   Results for orders placed or performed in visit on 08/09/16  CoaguChek XS/INR Waived  Result Value Ref Range   INR 2.2 (H) 0.9 - 1.1   Prothrombin Time 26.3 sec      Assessment & Plan:   Problem List Items Addressed This Visit      Cardiovascular and Mediastinum   Atrial fibrillation ([place]) (Chronic)   Relevant Orders   CoaguChek XS/INR Waived (Completed)     Other   History of prosthetic heart valve - Primary (Chronic)    Continue 3.5 mg daily, limit greens. Recheck INR in 1  month      Relevant Orders   CoaguChek XS/INR Waived (Completed)    Other Visit Diagnoses   None.      Follow up plan: Return in about 4 weeks (around 09/06/2016) for INRF.

## 2016-08-09 NOTE — Patient Instructions (Signed)
Follow up as needed. Reduce amount of greens in diet.

## 2016-08-10 DIAGNOSIS — H25812 Combined forms of age-related cataract, left eye: Secondary | ICD-10-CM | POA: Diagnosis not present

## 2016-08-10 DIAGNOSIS — H25012 Cortical age-related cataract, left eye: Secondary | ICD-10-CM | POA: Diagnosis not present

## 2016-08-10 DIAGNOSIS — H2512 Age-related nuclear cataract, left eye: Secondary | ICD-10-CM | POA: Diagnosis not present

## 2016-08-18 DIAGNOSIS — I255 Ischemic cardiomyopathy: Secondary | ICD-10-CM | POA: Diagnosis not present

## 2016-08-18 DIAGNOSIS — I10 Essential (primary) hypertension: Secondary | ICD-10-CM | POA: Diagnosis not present

## 2016-08-18 DIAGNOSIS — E7800 Pure hypercholesterolemia, unspecified: Secondary | ICD-10-CM | POA: Diagnosis not present

## 2016-08-18 DIAGNOSIS — Z95810 Presence of automatic (implantable) cardiac defibrillator: Secondary | ICD-10-CM | POA: Diagnosis not present

## 2016-08-18 DIAGNOSIS — Z9861 Coronary angioplasty status: Secondary | ICD-10-CM | POA: Diagnosis not present

## 2016-08-18 DIAGNOSIS — I472 Ventricular tachycardia: Secondary | ICD-10-CM | POA: Diagnosis not present

## 2016-08-18 DIAGNOSIS — Z8673 Personal history of transient ischemic attack (TIA), and cerebral infarction without residual deficits: Secondary | ICD-10-CM | POA: Diagnosis not present

## 2016-08-18 DIAGNOSIS — I2510 Atherosclerotic heart disease of native coronary artery without angina pectoris: Secondary | ICD-10-CM | POA: Diagnosis not present

## 2016-08-18 DIAGNOSIS — I481 Persistent atrial fibrillation: Secondary | ICD-10-CM | POA: Diagnosis not present

## 2016-08-18 DIAGNOSIS — Z952 Presence of prosthetic heart valve: Secondary | ICD-10-CM | POA: Diagnosis not present

## 2016-08-18 DIAGNOSIS — I5022 Chronic systolic (congestive) heart failure: Secondary | ICD-10-CM | POA: Diagnosis not present

## 2016-08-18 DIAGNOSIS — H269 Unspecified cataract: Secondary | ICD-10-CM | POA: Insufficient documentation

## 2016-09-06 ENCOUNTER — Ambulatory Visit (INDEPENDENT_AMBULATORY_CARE_PROVIDER_SITE_OTHER): Payer: Medicare Other | Admitting: Family Medicine

## 2016-09-06 ENCOUNTER — Encounter: Payer: Self-pay | Admitting: Family Medicine

## 2016-09-06 VITALS — BP 144/87 | HR 101 | Temp 98.4°F | Wt 168.4 lb

## 2016-09-06 DIAGNOSIS — I4891 Unspecified atrial fibrillation: Secondary | ICD-10-CM | POA: Diagnosis not present

## 2016-09-06 DIAGNOSIS — Z952 Presence of prosthetic heart valve: Secondary | ICD-10-CM | POA: Diagnosis not present

## 2016-09-06 LAB — COAGUCHEK XS/INR WAIVED
INR: 6.5 — AB (ref 0.9–1.1)
PROTHROMBIN TIME: 78.3 s

## 2016-09-06 NOTE — Progress Notes (Signed)
   BP (!) 144/87 (BP Location: Left Arm, Patient Position: Sitting, Cuff Size: Normal)   Pulse (!) 101   Temp 98.4 F (36.9 C)   Wt 168 lb 6.4 oz (76.4 kg)   SpO2 97%   BMI 28.37 kg/m    Subjective:    [name], male    DOB: [birth year], 75 y.o.   MRN: [id number]  HPI: Nymir Ringler is a 75 y.o. male  Chief Complaint  Patient presents with  . Coagulation Disorder   Doing well taking warfarin for artificial valves. Patient's noticed increased bruising these last couple weeks. There's been no change in diet except he has noticed this time of year or difficult to control anticoagulation. Is also noticed a little excessive bleeding when scraping himself while shaving.  Relevant past medical, surgical, family and social history reviewed and updated as indicated. Interim medical history since our last visit reviewed. Allergies and medications reviewed and updated.  Review of Systems  Constitutional: Negative.   Respiratory: Negative.   Cardiovascular: Negative.     Per HPI unless specifically indicated above     Objective:    BP (!) 144/87 (BP Location: Left Arm, Patient Position: Sitting, Cuff Size: Normal)   Pulse (!) 101   Temp 98.4 F (36.9 C)   Wt 168 lb 6.4 oz (76.4 kg)   SpO2 97%   BMI 28.37 kg/m   Wt Readings from Last 3 Encounters:  09/06/16 168 lb 6.4 oz (76.4 kg)  08/09/16 165 lb 9.6 oz (75.1 kg)  07/30/16 166 lb (75.3 kg)    Physical Exam  Constitutional: He is oriented to person, place, and time. He appears well-developed and well-nourished. No distress.  HENT:  Head: Normocephalic and atraumatic.  Right Ear: Hearing normal.  Left Ear: Hearing normal.  Nose: Nose normal.  Eyes: Conjunctivae and lids are normal. Right eye exhibits no discharge. Left eye exhibits no discharge. No scleral icterus.  Cardiovascular: Normal rate.   Pulmonary/Chest: Effort normal and breath sounds normal. No respiratory distress.  Musculoskeletal:  Normal range of motion.  Neurological: He is alert and oriented to person, place, and time.  Skin: Skin is intact. No rash noted.  Psychiatric: He has a normal mood and affect. His speech is normal and behavior is normal. Judgment and thought content normal. Cognition and memory are normal.    Results for orders placed or performed in visit on 08/09/16  CoaguChek XS/INR Waived  Result Value Ref Range   INR 2.2 (H) 0.9 - 1.1   Prothrombin Time 26.3 sec      Assessment & Plan:   Problem List Items Addressed This Visit      Cardiovascular and Mediastinum   Atrial fibrillation ([place]) - Primary (Chronic)   Relevant Orders   CoaguChek XS/INR Waived     Other   History of prosthetic heart valve (Chronic)    Gave written directions on warfarin and gave directions on cautions with activity and safety. If any trauma must proceed to the emergency room. Patient will eat lots of green to this week Not taking warfarin today and tomorrow then start with 3 mg alternating with 3.5 mg recheck INR 2 weeks.       Other Visit Diagnoses   None.      Follow up plan: Return in about 2 weeks (around 09/20/2016) for PT INR.

## 2016-09-06 NOTE — Assessment & Plan Note (Signed)
Gave written directions on warfarin and gave directions on cautions with activity and safety. If any trauma must proceed to the emergency room. Patient will eat lots of green to this week Not taking warfarin today and tomorrow then start with 3 mg alternating with 3.5 mg recheck INR 2 weeks.

## 2016-09-13 DIAGNOSIS — N183 Chronic kidney disease, stage 3 (moderate): Secondary | ICD-10-CM | POA: Diagnosis not present

## 2016-09-13 DIAGNOSIS — I10 Essential (primary) hypertension: Secondary | ICD-10-CM | POA: Diagnosis not present

## 2016-09-13 DIAGNOSIS — D472 Monoclonal gammopathy: Secondary | ICD-10-CM | POA: Diagnosis not present

## 2016-09-13 DIAGNOSIS — R600 Localized edema: Secondary | ICD-10-CM | POA: Diagnosis not present

## 2016-09-20 ENCOUNTER — Ambulatory Visit (INDEPENDENT_AMBULATORY_CARE_PROVIDER_SITE_OTHER): Payer: Medicare Other | Admitting: Family Medicine

## 2016-09-20 ENCOUNTER — Encounter: Payer: Self-pay | Admitting: Family Medicine

## 2016-09-20 VITALS — BP 137/79 | HR 97 | Temp 98.0°F | Wt 169.0 lb

## 2016-09-20 DIAGNOSIS — Z23 Encounter for immunization: Secondary | ICD-10-CM | POA: Diagnosis not present

## 2016-09-20 DIAGNOSIS — I4891 Unspecified atrial fibrillation: Secondary | ICD-10-CM

## 2016-09-20 DIAGNOSIS — Z952 Presence of prosthetic heart valve: Secondary | ICD-10-CM | POA: Diagnosis not present

## 2016-09-20 LAB — COAGUCHEK XS/INR WAIVED
INR: 3.1 — ABNORMAL HIGH (ref 0.9–1.1)
PROTHROMBIN TIME: 37.1 s

## 2016-09-20 NOTE — Assessment & Plan Note (Signed)
The current medical regimen is effective;  continue present plan and medications.  

## 2016-09-20 NOTE — Progress Notes (Signed)
   BP 137/79   Pulse 97   Temp 98 F (36.7 C)   Wt 169 lb (76.7 kg)   SpO2 99%   BMI 28.47 kg/m    Subjective:    [name], male    DOB: 05/08/1941, 75 y.o.   MRN: [id number]  HPI: Kirk Jones is a 75 y.o. male  Chief Complaint  Patient presents with  . Anticoagulation    alternating between 2.'5mg'$  and 3.'5mg'$ .  Marland Kitchen Immunizations    he wants to make sure it's safe for him to get his flu shot today with his INR level.   Patient doing well started in a consistent diet of vegetables and green stuff has quit going up and down. Is feeling  Better Discussed torn rotator cuff on the left and observing without Relevant past medical, surgical, family and social history reviewed and updated as indicated. Interim medical history since our last visit reviewed. Allergies and medications reviewed and updated.  Review of Systems  Constitutional: Negative.   Respiratory: Negative.   Cardiovascular: Negative.     Per HPI unless specifically indicated above     Objective:    BP 137/79   Pulse 97   Temp 98 F (36.7 C)   Wt 169 lb (76.7 kg)   SpO2 99%   BMI 28.47 kg/m   Wt Readings from Last 3 Encounters:  09/20/16 169 lb (76.7 kg)  09/06/16 168 lb 6.4 oz (76.4 kg)  08/09/16 165 lb 9.6 oz (75.1 kg)    Physical Exam  Constitutional: He is oriented to person, place, and time. He appears well-developed and well-nourished. No distress.  HENT:  Head: Normocephalic and atraumatic.  Right Ear: Hearing normal.  Left Ear: Hearing normal.  Nose: Nose normal.  Eyes: Conjunctivae and lids are normal. Right eye exhibits no discharge. Left eye exhibits no discharge. No scleral icterus.  Cardiovascular: Normal rate.   Pulmonary/Chest: Effort normal and breath sounds normal. No respiratory distress.  Musculoskeletal: Normal range of motion.  Neurological: He is alert and oriented to person, place, and time.  Skin: Skin is intact. No rash noted.  Psychiatric: He  has a normal mood and affect. His speech is normal and behavior is normal. Judgment and thought content normal. Cognition and memory are normal.    Results for orders placed or performed in visit on 09/06/16  CoaguChek XS/INR Waived  Result Value Ref Range   INR 6.5 (>) 0.9 - 1.1   Prothrombin Time 78.3 sec      Assessment & Plan:   Problem List Items Addressed This Visit      Cardiovascular and Mediastinum   Atrial fibrillation ([place]) - Primary (Chronic)    The current medical regimen is effective;  continue present plan and medications.       Relevant Orders   CoaguChek XS/INR Waived     Other   History of prosthetic heart valve (Chronic)   Relevant Orders   CoaguChek XS/INR Waived    Other Visit Diagnoses   None.     Reviewed consistent diet again with patient port and to maintain proper dosing Follow up plan: Return for As scheduled.

## 2016-09-30 ENCOUNTER — Other Ambulatory Visit: Payer: Self-pay | Admitting: Family Medicine

## 2016-09-30 NOTE — Telephone Encounter (Signed)
Routing to provider  

## 2016-10-01 ENCOUNTER — Telehealth: Payer: Self-pay | Admitting: Family Medicine

## 2016-10-01 NOTE — Telephone Encounter (Signed)
Patient states that he is going to see an oral surgeon on Monday, 10/04/16 at 1:30 and would like to know if it is okay to skip a dose of Warfarin prior to his appointment. Patient states that he is willing to come in for a PTINR before his appt if required. Please call patient to advise.

## 2016-10-04 NOTE — Telephone Encounter (Signed)
Routing to provider  

## 2016-10-04 NOTE — Telephone Encounter (Signed)
Patient is sick, his appointment has been pushed back for a couple weeks

## 2016-10-04 NOTE — Telephone Encounter (Signed)
Ok to skip until after visit

## 2016-10-05 DIAGNOSIS — I480 Paroxysmal atrial fibrillation: Secondary | ICD-10-CM | POA: Diagnosis not present

## 2016-10-06 ENCOUNTER — Encounter: Payer: Medicare Other | Admitting: Family Medicine

## 2016-10-06 ENCOUNTER — Encounter: Payer: Self-pay | Admitting: Family Medicine

## 2016-10-06 ENCOUNTER — Ambulatory Visit (INDEPENDENT_AMBULATORY_CARE_PROVIDER_SITE_OTHER): Payer: Medicare Other | Admitting: Family Medicine

## 2016-10-06 ENCOUNTER — Telehealth: Payer: Self-pay | Admitting: Family Medicine

## 2016-10-06 VITALS — BP 154/87 | HR 108 | Temp 99.1°F | Wt 167.0 lb

## 2016-10-06 DIAGNOSIS — J0100 Acute maxillary sinusitis, unspecified: Secondary | ICD-10-CM | POA: Diagnosis not present

## 2016-10-06 MED ORDER — AZITHROMYCIN 250 MG PO TABS: ORAL_TABLET | ORAL | 0 refills | Status: DC

## 2016-10-06 MED ORDER — BENZONATATE 100 MG PO CAPS: 200.0000 mg | ORAL_CAPSULE | Freq: Three times a day (TID) | ORAL | 0 refills | Status: DC | PRN

## 2016-10-06 NOTE — Progress Notes (Signed)
   BP (!) 154/87   Pulse (!) 108   Temp 99.1 F (37.3 C)   Wt 167 lb (75.8 kg)   SpO2 96%   BMI 28.14 kg/m    Subjective:    [name], male    DOB: [birth year], 75 y.o.   MRN: [id number]  HPI: Kirk Jones is a 75 y.o. male  Chief Complaint  Patient presents with  . URI    since Saturday, head congestion, some chest congestion, productive cough, drainage, runny nose, sore throat. No ear ache or fever.     Pressure in b/l maxillary sinuses, nasal congestion, sore throat, productive cough, low grade fever x 5 days. Denies ear pain, body aches. Has not been taking anything OTC for sxs. No sick contacts. Has a hernia, and states the constant coughing is really flaring up his pain related to that.   Relevant past medical, surgical, family and social history reviewed and updated as indicated. Interim medical history since our last visit reviewed. Allergies and medications reviewed and updated.  Review of Systems  Constitutional: Positive for fever.  HENT: Positive for congestion, rhinorrhea, sinus pain, sinus pressure and sore throat.   Respiratory: Positive for cough and wheezing.   Cardiovascular: Negative.   Gastrointestinal: Negative.   Genitourinary: Negative.   Musculoskeletal: Negative.   Skin: Negative.   Neurological: Negative.   Psychiatric/Behavioral: Negative.     Per HPI unless specifically indicated above     Objective:    BP (!) 154/87   Pulse (!) 108   Temp 99.1 F (37.3 C)   Wt 167 lb (75.8 kg)   SpO2 96%   BMI 28.14 kg/m   Wt Readings from Last 3 Encounters:  10/06/16 167 lb (75.8 kg)  09/20/16 169 lb (76.7 kg)  09/06/16 168 lb 6.4 oz (76.4 kg)    Physical Exam  Constitutional: He is oriented to person, place, and time. He appears well-developed and well-nourished. No distress.  HENT:  Head: Atraumatic.  Right Ear: External ear normal.  Left Ear: External ear normal.  Nose: Nose normal.  B/l maxillary sinuses  TTP Oropharynx erythematous  Eyes: Conjunctivae are normal. Pupils are equal, round, and reactive to light. No scleral icterus.  Neck: Normal range of motion. Neck supple.  Pulmonary/Chest: Effort normal. No respiratory distress. He has wheezes (mild wheezes R lower ). He has no rales.  Musculoskeletal: Normal range of motion.  Lymphadenopathy:    He has no cervical adenopathy.  Neurological: He is alert and oriented to person, place, and time.  Skin: Skin is warm and dry.  Psychiatric: He has a normal mood and affect. His behavior is normal.  Nursing note and vitals reviewed.     Assessment & Plan:   Problem List Items Addressed This Visit    None    Visit Diagnoses    Acute non-recurrent maxillary sinusitis    -  Primary   Azithromycin and tessalon perles sent. Use albuterol inhaler prn. Follow up if no improvement   Relevant Medications   azithromycin (ZITHROMAX) 250 MG tablet   benzonatate (TESSALON) 100 MG capsule       Follow up plan: Return if symptoms worsen or fail to improve.

## 2016-10-06 NOTE — Patient Instructions (Signed)
Follow up as needed

## 2016-10-06 NOTE — Telephone Encounter (Signed)
Pt would like to come in early Monday morning for his labs for his CPE. Can this be added before his appointment Monday. Thanks.

## 2016-10-07 ENCOUNTER — Other Ambulatory Visit: Payer: Self-pay | Admitting: Family Medicine

## 2016-10-07 DIAGNOSIS — D692 Other nonthrombocytopenic purpura: Secondary | ICD-10-CM

## 2016-10-07 DIAGNOSIS — I4891 Unspecified atrial fibrillation: Secondary | ICD-10-CM

## 2016-10-07 DIAGNOSIS — I10 Essential (primary) hypertension: Secondary | ICD-10-CM

## 2016-10-07 DIAGNOSIS — M1A9XX0 Chronic gout, unspecified, without tophus (tophi): Secondary | ICD-10-CM

## 2016-10-07 DIAGNOSIS — N183 Chronic kidney disease, stage 3 unspecified: Secondary | ICD-10-CM

## 2016-10-07 DIAGNOSIS — E7800 Pure hypercholesterolemia, unspecified: Secondary | ICD-10-CM

## 2016-10-11 ENCOUNTER — Encounter: Payer: Self-pay | Admitting: Family Medicine

## 2016-10-11 ENCOUNTER — Ambulatory Visit (INDEPENDENT_AMBULATORY_CARE_PROVIDER_SITE_OTHER): Payer: Medicare Other | Admitting: Family Medicine

## 2016-10-11 DIAGNOSIS — N403 Nodular prostate with lower urinary tract symptoms: Secondary | ICD-10-CM | POA: Diagnosis not present

## 2016-10-11 DIAGNOSIS — N138 Other obstructive and reflux uropathy: Secondary | ICD-10-CM | POA: Diagnosis not present

## 2016-10-11 DIAGNOSIS — I10 Essential (primary) hypertension: Secondary | ICD-10-CM

## 2016-10-11 DIAGNOSIS — N183 Chronic kidney disease, stage 3 unspecified: Secondary | ICD-10-CM

## 2016-10-11 DIAGNOSIS — M1A9XX0 Chronic gout, unspecified, without tophus (tophi): Secondary | ICD-10-CM | POA: Diagnosis not present

## 2016-10-11 DIAGNOSIS — E7800 Pure hypercholesterolemia, unspecified: Secondary | ICD-10-CM | POA: Diagnosis not present

## 2016-10-11 DIAGNOSIS — J449 Chronic obstructive pulmonary disease, unspecified: Secondary | ICD-10-CM | POA: Diagnosis not present

## 2016-10-11 DIAGNOSIS — I4891 Unspecified atrial fibrillation: Secondary | ICD-10-CM

## 2016-10-11 DIAGNOSIS — D692 Other nonthrombocytopenic purpura: Secondary | ICD-10-CM | POA: Diagnosis not present

## 2016-10-11 LAB — MICROSCOPIC EXAMINATION

## 2016-10-11 LAB — URINALYSIS, ROUTINE W REFLEX MICROSCOPIC
Bilirubin, UA: NEGATIVE
Glucose, UA: NEGATIVE
Ketones, UA: NEGATIVE
Leukocytes, UA: NEGATIVE
NITRITE UA: NEGATIVE
PH UA: 6 (ref 5.0–7.5)
Specific Gravity, UA: 1.01 (ref 1.005–1.030)
UUROB: 0.2 mg/dL (ref 0.2–1.0)

## 2016-10-11 LAB — COAGUCHEK XS/INR WAIVED
INR: 4.2 — ABNORMAL HIGH (ref 0.9–1.1)
PROTHROMBIN TIME: 50.4 s

## 2016-10-11 MED ORDER — TAMSULOSIN HCL 0.4 MG PO CAPS: 0.4000 mg | ORAL_CAPSULE | Freq: Every day | ORAL | 4 refills | Status: DC

## 2016-10-11 MED ORDER — TIOTROPIUM BROMIDE MONOHYDRATE 18 MCG IN CAPS: 18.0000 ug | ORAL_CAPSULE | Freq: Every day | RESPIRATORY_TRACT | 4 refills | Status: DC

## 2016-10-11 MED ORDER — FINASTERIDE 5 MG PO TABS: 5.0000 mg | ORAL_TABLET | Freq: Every day | ORAL | 4 refills | Status: DC

## 2016-10-11 MED ORDER — OMEPRAZOLE 20 MG PO CPDR: 20.0000 mg | DELAYED_RELEASE_CAPSULE | Freq: Two times a day (BID) | ORAL | 4 refills | Status: DC

## 2016-10-11 MED ORDER — DILTIAZEM HCL ER 180 MG PO CP24: 180.0000 mg | ORAL_CAPSULE | Freq: Every day | ORAL | 4 refills | Status: DC

## 2016-10-11 MED ORDER — FLUTICASONE PROPIONATE 50 MCG/ACT NA SUSP: 2.0000 | Freq: Every day | NASAL | 12 refills | Status: DC

## 2016-10-11 MED ORDER — LOSARTAN POTASSIUM 25 MG PO TABS: 25.0000 mg | ORAL_TABLET | Freq: Every day | ORAL | 4 refills | Status: DC

## 2016-10-11 MED ORDER — METOPROLOL SUCCINATE ER 50 MG PO TB24
50.0000 mg | ORAL_TABLET | Freq: Two times a day (BID) | ORAL | 4 refills | Status: DC
Start: 2016-10-11 — End: 2017-10-13

## 2016-10-11 MED ORDER — WARFARIN SODIUM 3 MG PO TABS: 3.0000 mg | ORAL_TABLET | Freq: Every day | ORAL | 1 refills | Status: DC

## 2016-10-11 NOTE — Assessment & Plan Note (Signed)
Poor control off medications otherwise is been good control

## 2016-10-11 NOTE — Assessment & Plan Note (Signed)
Discussed Will start Flomax and Proscar.

## 2016-10-11 NOTE — Assessment & Plan Note (Signed)
The current medical regimen is effective;  continue present plan and medications.  

## 2016-10-11 NOTE — Assessment & Plan Note (Signed)
Change warfarin to 3 mg constant diet recheck INR 2 weeks

## 2016-10-11 NOTE — Progress Notes (Signed)
BP (!) 180/76   Pulse 99   Temp 98.2 F (36.8 C)   Ht 5' 3.5" (1.613 m)   Wt 163 lb (73.9 kg)   SpO2 99%   BMI 28.42 kg/m    Subjective:    [name], male    DOB: Aug 14, 1941, 75 y.o.   MRN: [id number]  HPI: Chetan Mehring is a 75 y.o. male  Chief Complaint  Patient presents with  . Annual Exam  . Anticoagulation    3.63m, 2/524malternating  AWV metrics met  Patient chart review still having trouble adjusting warfarin taking 3-1/2 alternating with 2-1/2. Patient's been trying to eat more greens which he has been but his INR today was 4.2. Has had more misses. low. So will decrease dose to 3 mg a day.  Cold is better. Patient did not take his medications today this his blood pressures elevated but otherwise has good blood pressure. No issues with medications which he takes faithfully. Patient also having trouble with urinary hesitancy getting urine stream started and continuing flow. Used dialysis 5 mg a day which helped but was too expensive will stress something else. Relevant past medical, surgical, family and social history reviewed and updated as indicated. Interim medical history since our last visit reviewed. Allergies and medications reviewed and updated.  Review of Systems  Constitutional: Negative.   HENT: Negative.   Eyes: Negative.   Respiratory: Negative.   Cardiovascular: Negative.   Gastrointestinal: Negative.   Endocrine: Negative.   Genitourinary: Negative.   Musculoskeletal: Negative.   Skin: Negative.   Allergic/Immunologic: Negative.   Neurological: Negative.   Hematological: Negative.   Psychiatric/Behavioral: Negative.     Per HPI unless specifically indicated above     Objective:    BP (!) 180/76   Pulse 99   Temp 98.2 F (36.8 C)   Ht 5' 3.5" (1.613 m)   Wt 163 lb (73.9 kg)   SpO2 99%   BMI 28.42 kg/m   Wt Readings from Last 3 Encounters:  10/11/16 163 lb (73.9 kg)  10/06/16 167 lb (75.8 kg)  09/20/16  169 lb (76.7 kg)    Physical Exam  Constitutional: He is oriented to person, place, and time. He appears well-developed and well-nourished.  HENT:  Head: Normocephalic and atraumatic.  Right Ear: External ear normal.  Left Ear: External ear normal.  Eyes: Conjunctivae and EOM are normal. Pupils are equal, round, and reactive to light.  Neck: Normal range of motion. Neck supple.  Cardiovascular: Normal rate, regular rhythm, normal heart sounds and intact distal pulses.   Pulmonary/Chest: Effort normal and breath sounds normal.  Abdominal: Soft. Bowel sounds are normal. There is no splenomegaly or hepatomegaly.  Genitourinary: Rectum normal and penis normal.  Genitourinary Comments: Prostate enlarged  Musculoskeletal: Normal range of motion.  Neurological: He is alert and oriented to person, place, and time. He has normal reflexes.  Skin: No rash noted. No erythema.  Psychiatric: He has a normal mood and affect. His behavior is normal. Judgment and thought content normal.    Results for orders placed or performed in visit on 09/20/16  CoaguChek XS/INR Waived  Result Value Ref Range   INR 3.1 (H) 0.9 - 1.1   Prothrombin Time 37.1 sec      Assessment & Plan:   Problem List Items Addressed This Visit      Cardiovascular and Mediastinum   Atrial fibrillation ([facility]) (Chronic)    Change warfarin to 3 mg constant  diet recheck INR 2 weeks      Relevant Medications   diltiazem (DILACOR XR) 180 MG 24 hr capsule   losartan (COZAAR) 25 MG tablet   metoprolol succinate (TOPROL-XL) 50 MG 24 hr tablet   warfarin (COUMADIN) 3 MG tablet   Hypertension    Poor control off medications otherwise is been good control      Relevant Medications   diltiazem (DILACOR XR) 180 MG 24 hr capsule   losartan (COZAAR) 25 MG tablet   metoprolol succinate (TOPROL-XL) 50 MG 24 hr tablet   warfarin (COUMADIN) 3 MG tablet   Senile purpura (HCC)   Relevant Medications   diltiazem (DILACOR XR) 180 MG 24  hr capsule   losartan (COZAAR) 25 MG tablet   metoprolol succinate (TOPROL-XL) 50 MG 24 hr tablet   warfarin (COUMADIN) 3 MG tablet     Respiratory   COPD (chronic obstructive pulmonary disease) (HCC)   Relevant Medications   fluticasone (FLONASE) 50 MCG/ACT nasal spray   tiotropium (SPIRIVA) 18 MCG inhalation capsule     Genitourinary   CKD (chronic kidney disease), stage III   Nodular prostate with urinary obstruction    Discussed Will start Flomax and Proscar.      Relevant Medications   tamsulosin (FLOMAX) 0.4 MG CAPS capsule   finasteride (PROSCAR) 5 MG tablet     Other   Hyperlipidemia    The current medical regimen is effective;  continue present plan and medications.       Relevant Medications   diltiazem (DILACOR XR) 180 MG 24 hr capsule   losartan (COZAAR) 25 MG tablet   metoprolol succinate (TOPROL-XL) 50 MG 24 hr tablet   warfarin (COUMADIN) 3 MG tablet   Gout       Follow up plan: Return for PT INR and in 6 mo BMP PSA.

## 2016-10-12 ENCOUNTER — Encounter: Payer: Self-pay | Admitting: Family Medicine

## 2016-10-12 LAB — CBC WITH DIFFERENTIAL/PLATELET
BASOS: 1 %
Basophils Absolute: 0.1 10*3/uL (ref 0.0–0.2)
EOS (ABSOLUTE): 0.1 10*3/uL (ref 0.0–0.4)
EOS: 1 %
HEMATOCRIT: 42.6 % (ref 37.5–51.0)
HEMOGLOBIN: 14.4 g/dL (ref 12.6–17.7)
IMMATURE GRANS (ABS): 0 10*3/uL (ref 0.0–0.1)
Immature Granulocytes: 1 %
LYMPHS: 24 %
Lymphocytes Absolute: 1.2 10*3/uL (ref 0.7–3.1)
MCH: 32.7 pg (ref 26.6–33.0)
MCHC: 33.8 g/dL (ref 31.5–35.7)
MCV: 97 fL (ref 79–97)
MONOCYTES: 12 %
Monocytes Absolute: 0.6 10*3/uL (ref 0.1–0.9)
NEUTROS ABS: 3.1 10*3/uL (ref 1.4–7.0)
Neutrophils: 61 %
Platelets: 238 10*3/uL (ref 150–379)
RBC: 4.41 x10E6/uL (ref 4.14–5.80)
RDW: 15.6 % — ABNORMAL HIGH (ref 12.3–15.4)
WBC: 5.1 10*3/uL (ref 3.4–10.8)

## 2016-10-12 LAB — LIPID PANEL
CHOLESTEROL TOTAL: 228 mg/dL — AB (ref 100–199)
Chol/HDL Ratio: 2 ratio units (ref 0.0–5.0)
HDL: 116 mg/dL (ref >39)
LDL CALC: 97 mg/dL (ref 0–99)
TRIGLYCERIDES: 74 mg/dL (ref 0–149)
VLDL Cholesterol Cal: 15 mg/dL (ref 5–40)

## 2016-10-12 LAB — COMPREHENSIVE METABOLIC PANEL
ALK PHOS: 67 IU/L (ref 39–117)
ALT: 25 IU/L (ref 0–44)
AST: 47 IU/L — AB (ref 0–40)
Albumin/Globulin Ratio: 1.8 (ref 1.2–2.2)
Albumin: 4.4 g/dL (ref 3.5–4.8)
BUN/Creatinine Ratio: 15 (ref 10–24)
BUN: 17 mg/dL (ref 8–27)
Bilirubin Total: 0.6 mg/dL (ref 0.0–1.2)
CHLORIDE: 93 mmol/L — AB (ref 96–106)
CO2: 28 mmol/L (ref 18–29)
CREATININE: 1.12 mg/dL (ref 0.76–1.27)
Calcium: 9.7 mg/dL (ref 8.6–10.2)
GFR calc Af Amer: 74 mL/min/{1.73_m2} (ref >59)
GFR calc non Af Amer: 64 mL/min/{1.73_m2} (ref >59)
GLOBULIN, TOTAL: 2.5 g/dL (ref 1.5–4.5)
GLUCOSE: 94 mg/dL (ref 65–99)
POTASSIUM: 4.7 mmol/L (ref 3.5–5.2)
SODIUM: 139 mmol/L (ref 134–144)
Total Protein: 6.9 g/dL (ref 6.0–8.5)

## 2016-10-12 LAB — TSH: TSH: 1.28 u[IU]/mL (ref 0.450–4.500)

## 2016-10-12 LAB — URIC ACID: Uric Acid: 9.8 mg/dL — ABNORMAL HIGH (ref 3.7–8.6)

## 2016-10-13 ENCOUNTER — Encounter: Payer: Self-pay | Admitting: Family Medicine

## 2016-10-13 ENCOUNTER — Ambulatory Visit (INDEPENDENT_AMBULATORY_CARE_PROVIDER_SITE_OTHER): Payer: Medicare Other | Admitting: Family Medicine

## 2016-10-13 VITALS — BP 150/92 | HR 64 | Temp 98.2°F | Wt 167.0 lb

## 2016-10-13 DIAGNOSIS — N138 Other obstructive and reflux uropathy: Secondary | ICD-10-CM

## 2016-10-13 DIAGNOSIS — Z952 Presence of prosthetic heart valve: Secondary | ICD-10-CM | POA: Diagnosis not present

## 2016-10-13 DIAGNOSIS — I4891 Unspecified atrial fibrillation: Secondary | ICD-10-CM

## 2016-10-13 DIAGNOSIS — N403 Nodular prostate with lower urinary tract symptoms: Secondary | ICD-10-CM

## 2016-10-13 LAB — COAGUCHEK XS/INR WAIVED
INR: 3.2 — ABNORMAL HIGH (ref 0.9–1.1)
Prothrombin Time: 37.8 s

## 2016-10-13 MED ORDER — WARFARIN SODIUM 3 MG PO TABS: 3.0000 mg | ORAL_TABLET | Freq: Every day | ORAL | 1 refills | Status: DC

## 2016-10-13 NOTE — Progress Notes (Signed)
BP (!) 150/92   Pulse 64   Temp 98.2 F (36.8 C)   Wt 167 lb (75.8 kg)   SpO2 95%   BMI 29.12 kg/m    Subjective:    [name], male    DOB: November 02, 1941, 75 y.o.   MRN: [id number]  HPI: Kirk Jones is a 75 y.o. male  Chief Complaint  Patient presents with  . Anticoagulation    recheck. needs approval to get his tooth pulled.  Patient recheck INR has been too high has been holding his warfarin. Otherwise is been doing well. Did not start finasteride because of concern about side effects. Patient with no bleeding bruising issues or concerns.   Relevant past medical, surgical, family and social history reviewed and updated as indicated. Interim medical history since our last visit reviewed. Allergies and medications reviewed and updated.  Review of Systems  Constitutional: Negative.   Respiratory: Negative.   Cardiovascular: Negative.     Per HPI unless specifically indicated above     Objective:    BP (!) 150/92   Pulse 64   Temp 98.2 F (36.8 C)   Wt 167 lb (75.8 kg)   SpO2 95%   BMI 29.12 kg/m   Wt Readings from Last 3 Encounters:  10/13/16 167 lb (75.8 kg)  10/11/16 163 lb (73.9 kg)  10/06/16 167 lb (75.8 kg)    Physical Exam  Constitutional: He is oriented to person, place, and time. He appears well-developed and well-nourished. No distress.  HENT:  Head: Normocephalic and atraumatic.  Right Ear: Hearing normal.  Left Ear: Hearing normal.  Nose: Nose normal.  Eyes: Conjunctivae and lids are normal. Right eye exhibits no discharge. Left eye exhibits no discharge. No scleral icterus.  Cardiovascular: Normal rate.   Pulmonary/Chest: Effort normal and breath sounds normal. No respiratory distress.  Musculoskeletal: Normal range of motion.  Neurological: He is alert and oriented to person, place, and time.  Skin: Skin is intact. No rash noted.  Psychiatric: He has a normal mood and affect. His speech is normal and behavior is  normal. Judgment and thought content normal. Cognition and memory are normal.    Results for orders placed or performed in visit on 10/11/16  Microscopic Examination  Result Value Ref Range   WBC, UA 0-5 0 - 5 /hpf   RBC, UA 3-10 (A) 0 - 2 /hpf   Epithelial Cells (non renal) CANCELED    Bacteria, UA Few (A) None seen/Few  Comprehensive metabolic panel  Result Value Ref Range   Glucose 94 65 - 99 mg/dL   BUN 17 8 - 27 mg/dL   Creatinine, Ser 1.12 0.76 - 1.27 mg/dL   GFR calc non Af Amer 64 >59 mL/min/1.73   GFR calc Af Amer 74 >59 mL/min/1.73   BUN/Creatinine Ratio 15 10 - 24   Sodium 139 134 - 144 mmol/L   Potassium 4.7 3.5 - 5.2 mmol/L   Chloride 93 (L) 96 - 106 mmol/L   CO2 28 18 - 29 mmol/L   Calcium 9.7 8.6 - 10.2 mg/dL   Total Protein 6.9 6.0 - 8.5 g/dL   Albumin 4.4 3.5 - 4.8 g/dL   Globulin, Total 2.5 1.5 - 4.5 g/dL   Albumin/Globulin Ratio 1.8 1.2 - 2.2   Bilirubin Total 0.6 0.0 - 1.2 mg/dL   Alkaline Phosphatase 67 39 - 117 IU/L   AST 47 (H) 0 - 40 IU/L   ALT 25 0 - 44 IU/L  Lipid panel  Result Value Ref Range   Cholesterol, Total 228 (H) 100 - 199 mg/dL   Triglycerides 74 0 - 149 mg/dL   HDL 116 >39 mg/dL   VLDL Cholesterol Cal 15 5 - 40 mg/dL   LDL Calculated 97 0 - 99 mg/dL   Chol/HDL Ratio 2.0 0.0 - 5.0 ratio units  CBC with Differential/Platelet  Result Value Ref Range   WBC 5.1 3.4 - 10.8 x10E3/uL   RBC 4.41 4.14 - 5.80 x10E6/uL   Hemoglobin 14.4 12.6 - 17.7 g/dL   Hematocrit 42.6 37.5 - 51.0 %   MCV 97 79 - 97 fL   MCH 32.7 26.6 - 33.0 pg   MCHC 33.8 31.5 - 35.7 g/dL   RDW 15.6 (H) 12.3 - 15.4 %   Platelets 238 150 - 379 x10E3/uL   Neutrophils 61 Not Estab. %   Lymphs 24 Not Estab. %   Monocytes 12 Not Estab. %   Eos 1 Not Estab. %   Basos 1 Not Estab. %   Neutrophils Absolute 3.1 1.4 - 7.0 x10E3/uL   Lymphocytes Absolute 1.2 0.7 - 3.1 x10E3/uL   Monocytes Absolute 0.6 0.1 - 0.9 x10E3/uL   EOS (ABSOLUTE) 0.1 0.0 - 0.4 x10E3/uL   Basophils  Absolute 0.1 0.0 - 0.2 x10E3/uL   Immature Granulocytes 1 Not Estab. %   Immature Grans (Abs) 0.0 0.0 - 0.1 x10E3/uL  TSH  Result Value Ref Range   TSH 1.280 0.450 - 4.500 uIU/mL  Urinalysis, Routine w reflex microscopic (not at [facility])  Result Value Ref Range   Specific Gravity, UA 1.010 1.005 - 1.030   pH, UA 6.0 5.0 - 7.5   Color, UA Yellow Yellow   Appearance Ur Clear Clear   Leukocytes, UA Negative Negative   Protein, UA 1+ (A) Negative/Trace   Glucose, UA Negative Negative   Ketones, UA Negative Negative   RBC, UA 1+ (A) Negative   Bilirubin, UA Negative Negative   Urobilinogen, Ur 0.2 0.2 - 1.0 mg/dL   Nitrite, UA Negative Negative   Microscopic Examination See below:   CoaguChek XS/INR Waived  Result Value Ref Range   INR 4.2 (H) 0.9 - 1.1   Prothrombin Time 50.4 sec  Uric acid  Result Value Ref Range   Uric Acid 9.8 (H) 3.7 - 8.6 mg/dL      Assessment & Plan:   Problem List Items Addressed This Visit      Cardiovascular and Mediastinum   Atrial fibrillation (HCC) - Primary (Chronic)   Relevant Medications   warfarin (COUMADIN) 3 MG tablet   Other Relevant Orders   CoaguChek XS/INR Waived     Genitourinary   Nodular prostate with urinary obstruction    On review of side effects patient's ongoing symptoms and overall health we'll not add Proscar. And discontinue.        Other   History of prosthetic heart valve (Chronic)    Okay for dental surgery today.          Follow up plan: Return in about 2 weeks (around 10/27/2016) for PT INR.

## 2016-10-13 NOTE — Assessment & Plan Note (Signed)
Okay for dental surgery today.

## 2016-10-13 NOTE — Assessment & Plan Note (Signed)
On review of side effects patient's ongoing symptoms and overall health we'll not add Proscar. And discontinue.

## 2016-11-11 ENCOUNTER — Encounter: Payer: Self-pay | Admitting: Family Medicine

## 2016-11-11 ENCOUNTER — Ambulatory Visit (INDEPENDENT_AMBULATORY_CARE_PROVIDER_SITE_OTHER): Payer: Medicare Other | Admitting: Family Medicine

## 2016-11-11 VITALS — BP 151/77 | HR 93 | Temp 98.1°F | Ht 63.0 in | Wt 168.2 lb

## 2016-11-11 DIAGNOSIS — I4891 Unspecified atrial fibrillation: Secondary | ICD-10-CM

## 2016-11-11 DIAGNOSIS — Z952 Presence of prosthetic heart valve: Secondary | ICD-10-CM | POA: Diagnosis not present

## 2016-11-11 DIAGNOSIS — Z95810 Presence of automatic (implantable) cardiac defibrillator: Secondary | ICD-10-CM | POA: Diagnosis not present

## 2016-11-11 LAB — COAGUCHEK XS/INR WAIVED
INR: 1.9 — ABNORMAL HIGH (ref 0.9–1.1)
PROTHROMBIN TIME: 23.1 s

## 2016-11-11 NOTE — Assessment & Plan Note (Signed)
Reviewed risks benefits of warfarin therapy with valve replacement with goal of INR 2.5-3.5. Reviewed stroke risk will continue current dose of warfarin 3 mg a day recheck INR 1 week patient will eat a standard diet. Minimal greens especially this week. If INR not up next week will increase to 3-1/2 alternating 3.

## 2016-11-11 NOTE — Progress Notes (Signed)
   BP (!) 151/77 (BP Location: Left Arm, Patient Position: Sitting, Cuff Size: Normal)   Pulse 93   Temp 98.1 F (36.7 C)   Ht '5\' 3"'$  (1.6 m)   Wt 168 lb 3.2 oz (76.3 kg)   SpO2 97%   BMI 29.80 kg/m    Subjective:    [name], male    DOB: 05-22-1941, 75 y.o.   MRN: [id number]  HPI: Kirk Jones is a 75 y.o. male  Chief Complaint  Patient presents with  . Coagulation Disorder   Patient's remains on 30 g warfarin a day was taking that last time when INR was just fine. Patient's concerned has continued on 3 and not having any bruising on his arms but hasn't been outside and is wearing long sleeves. Chart review patient H times seems to be a big swing and warfarin patient's had dietary indiscretion with either a lot of green to very little greens. Patient did have salad yesterday.  Relevant past medical, surgical, family and social history reviewed and updated as indicated. Interim medical history since our last visit reviewed. Allergies and medications reviewed and updated.  Review of Systems  Constitutional: Negative.   Respiratory: Negative.   Cardiovascular: Negative.     Per HPI unless specifically indicated above     Objective:    BP (!) 151/77 (BP Location: Left Arm, Patient Position: Sitting, Cuff Size: Normal)   Pulse 93   Temp 98.1 F (36.7 C)   Ht '5\' 3"'$  (1.6 m)   Wt 168 lb 3.2 oz (76.3 kg)   SpO2 97%   BMI 29.80 kg/m   Wt Readings from Last 3 Encounters:  11/11/16 168 lb 3.2 oz (76.3 kg)  10/13/16 167 lb (75.8 kg)  10/11/16 163 lb (73.9 kg)    Physical Exam  Constitutional: He is oriented to person, place, and time. He appears well-developed and well-nourished. No distress.  HENT:  Head: Normocephalic and atraumatic.  Right Ear: Hearing normal.  Left Ear: Hearing normal.  Nose: Nose normal.  Eyes: Conjunctivae and lids are normal. Right eye exhibits no discharge. Left eye exhibits no discharge. No scleral icterus.    Pulmonary/Chest: Effort normal and breath sounds normal. No respiratory distress.  Musculoskeletal: Normal range of motion.  Neurological: He is alert and oriented to person, place, and time.  Skin: Skin is intact. No rash noted.  Psychiatric: He has a normal mood and affect. His speech is normal and behavior is normal. Judgment and thought content normal. Cognition and memory are normal.    Results for orders placed or performed in visit on 10/13/16  CoaguChek XS/INR Waived  Result Value Ref Range   INR 3.2 (H) 0.9 - 1.1   Prothrombin Time 37.8 sec      Assessment & Plan:   Problem List Items Addressed This Visit      Cardiovascular and Mediastinum   Atrial fibrillation ([place]) - Primary (Chronic)    Reviewed risks benefits of warfarin therapy with valve replacement with goal of INR 2.5-3.5. Reviewed stroke risk will continue current dose of warfarin 3 mg a day recheck INR 1 week patient will eat a standard diet. Minimal greens especially this week. If INR not up next week will increase to 3-1/2 alternating 3.      Relevant Orders   CoaguChek XS/INR Waived       Follow up plan: Return for One week, PT INR.

## 2016-11-16 ENCOUNTER — Ambulatory Visit (INDEPENDENT_AMBULATORY_CARE_PROVIDER_SITE_OTHER): Payer: Medicare Other | Admitting: Family Medicine

## 2016-11-16 ENCOUNTER — Encounter: Payer: Self-pay | Admitting: Family Medicine

## 2016-11-16 VITALS — BP 182/106 | HR 116 | Temp 98.4°F | Wt 167.0 lb

## 2016-11-16 DIAGNOSIS — I4891 Unspecified atrial fibrillation: Secondary | ICD-10-CM

## 2016-11-16 DIAGNOSIS — Z952 Presence of prosthetic heart valve: Secondary | ICD-10-CM | POA: Diagnosis not present

## 2016-11-16 LAB — COAGUCHEK XS/INR WAIVED
INR: 2.8 — AB (ref 0.9–1.1)
PROTHROMBIN TIME: 34 s

## 2016-11-16 NOTE — Progress Notes (Signed)
   BP (!) 182/106 (BP Location: Left Arm, Patient Position: Sitting, Cuff Size: Normal)   Pulse (!) 116   Temp 98.4 F (36.9 C)   Wt 167 lb (75.8 kg)   SpO2 95%   BMI 29.58 kg/m    Subjective:    [name], male    DOB: 06/04/1941, 75 y.o.   MRN: [id number]  HPI: Kirk Jones is a 75 y.o. male  Chief Complaint  Patient presents with  . Anticoagulation  Patient follow-up doing well with warfarin no issues INR of 2.8 today no bleeding bruising issues taking medicines faithfully and trying to moderate greens.  Relevant past medical, surgical, family and social history reviewed and updated as indicated. Interim medical history since our last visit reviewed. Allergies and medications reviewed and updated.  Review of Systems  Per HPI unless specifically indicated above     Objective:    BP (!) 182/106 (BP Location: Left Arm, Patient Position: Sitting, Cuff Size: Normal)   Pulse (!) 116   Temp 98.4 F (36.9 C)   Wt 167 lb (75.8 kg)   SpO2 95%   BMI 29.58 kg/m   Wt Readings from Last 3 Encounters:  11/16/16 167 lb (75.8 kg)  11/11/16 168 lb 3.2 oz (76.3 kg)  10/13/16 167 lb (75.8 kg)    Physical Exam  Constitutional: He is oriented to person, place, and time. He appears well-developed and well-nourished. No distress.  HENT:  Head: Normocephalic and atraumatic.  Right Ear: Hearing normal.  Left Ear: Hearing normal.  Nose: Nose normal.  Eyes: Conjunctivae and lids are normal. Right eye exhibits no discharge. Left eye exhibits no discharge. No scleral icterus.  Pulmonary/Chest: Effort normal. No respiratory distress.  Musculoskeletal: Normal range of motion.  Neurological: He is alert and oriented to person, place, and time.  Skin: Skin is intact. No rash noted.  Psychiatric: He has a normal mood and affect. His speech is normal and behavior is normal. Judgment and thought content normal. Cognition and memory are normal.    Results for orders  placed or performed in visit on 11/11/16  CoaguChek XS/INR Waived  Result Value Ref Range   INR 1.9 (H) 0.9 - 1.1   Prothrombin Time 23.1 sec      Assessment & Plan:   Problem List Items Addressed This Visit      Cardiovascular and Mediastinum   Atrial fibrillation ([place]) - Primary (Chronic)   Relevant Orders   CoaguChek XS/INR Waived     Other   History of prosthetic heart valve (Chronic)    The current medical regimen is effective;  continue present plan and medications.           Follow up plan: Return in about 4 weeks (around 12/14/2016) for PT INR.

## 2016-11-16 NOTE — Assessment & Plan Note (Signed)
The current medical regimen is effective;  continue present plan and medications.  

## 2016-11-25 ENCOUNTER — Other Ambulatory Visit: Payer: Self-pay | Admitting: Family Medicine

## 2016-11-25 DIAGNOSIS — I10 Essential (primary) hypertension: Secondary | ICD-10-CM

## 2016-12-14 ENCOUNTER — Ambulatory Visit (INDEPENDENT_AMBULATORY_CARE_PROVIDER_SITE_OTHER): Payer: Medicare Other | Admitting: Family Medicine

## 2016-12-14 ENCOUNTER — Encounter: Payer: Self-pay | Admitting: Family Medicine

## 2016-12-14 VITALS — BP 142/100 | HR 91 | Ht 64.0 in | Wt 169.0 lb

## 2016-12-14 DIAGNOSIS — I4891 Unspecified atrial fibrillation: Secondary | ICD-10-CM

## 2016-12-14 DIAGNOSIS — Z952 Presence of prosthetic heart valve: Secondary | ICD-10-CM

## 2016-12-14 LAB — COAGUCHEK XS/INR WAIVED
INR: 1.8 — AB (ref 0.9–1.1)
Prothrombin Time: 21.5 s

## 2016-12-14 MED ORDER — WARFARIN SODIUM 3 MG PO TABS: 3.0000 mg | ORAL_TABLET | Freq: Every day | ORAL | 1 refills | Status: DC

## 2016-12-14 NOTE — Progress Notes (Signed)
   BP (!) 142/100   Pulse 91   Ht '5\' 4"'$  (1.626 m)   Wt 169 lb (76.7 kg)   SpO2 99%   BMI 29.01 kg/m    Subjective:    [name], male    DOB: 03/06/1941, 77 y.o.   MRN: [id number]  HPI: Kirk Jones is a 76 y.o. male  Chief Complaint  Patient presents with  . Follow-up  . Coagulation Disorder  a fib with valve replacement Taking warfarin faithfully without problems has not really very his diet but is through the holiday season. Patient continues to take 3 mg a day and INR are consistently between 4 and 1.8 today. Patient seems to go up and down in spite of diet and nutrition.  Relevant past medical, surgical, family and social history reviewed and updated as indicated. Interim medical history since our last visit reviewed. Allergies and medications reviewed and updated.  Review of Systems  Per HPI unless specifically indicated above     Objective:    BP (!) 142/100   Pulse 91   Ht '5\' 4"'$  (1.626 m)   Wt 169 lb (76.7 kg)   SpO2 99%   BMI 29.01 kg/m   Wt Readings from Last 3 Encounters:  12/14/16 169 lb (76.7 kg)  11/16/16 167 lb (75.8 kg)  11/11/16 168 lb 3.2 oz (76.3 kg)    Physical Exam  Constitutional: He is oriented to person, place, and time. He appears well-developed and well-nourished. No distress.  HENT:  Head: Normocephalic and atraumatic.  Right Ear: Hearing normal.  Left Ear: Hearing normal.  Nose: Nose normal.  Eyes: Conjunctivae and lids are normal. Right eye exhibits no discharge. Left eye exhibits no discharge. No scleral icterus.  Pulmonary/Chest: Effort normal and breath sounds normal. No respiratory distress.  Musculoskeletal: Normal range of motion.  Neurological: He is alert and oriented to person, place, and time.  Skin: Skin is intact. No rash noted.  Psychiatric: He has a normal mood and affect. His speech is normal and behavior is normal. Judgment and thought content normal. Cognition and memory are normal.     Results for orders placed or performed in visit on 11/16/16  CoaguChek XS/INR Waived  Result Value Ref Range   INR 2.8 (H) 0.9 - 1.1   Prothrombin Time 34.0 sec      Assessment & Plan:   Problem List Items Addressed This Visit      Cardiovascular and Mediastinum   Atrial fibrillation (HCC) - Primary (Chronic)   Relevant Medications   warfarin (COUMADIN) 3 MG tablet   Other Relevant Orders   CoaguChek XS/INR Waived (STAT)     Other   History of prosthetic heart valve (Chronic)    Reviewed will continue current medication recheck 2 weeks for INR discussed diet and nutrition patient will be consistent.      Relevant Orders   CoaguChek XS/INR Waived (STAT)       Follow up plan: Return in about 2 weeks (around 12/28/2016) for PT INR.

## 2016-12-14 NOTE — Assessment & Plan Note (Signed)
Reviewed will continue current medication recheck 2 weeks for INR discussed diet and nutrition patient will be consistent.

## 2016-12-29 ENCOUNTER — Ambulatory Visit: Payer: Medicare Other | Admitting: Family Medicine

## 2016-12-30 ENCOUNTER — Encounter: Payer: Self-pay | Admitting: Family Medicine

## 2016-12-30 ENCOUNTER — Ambulatory Visit (INDEPENDENT_AMBULATORY_CARE_PROVIDER_SITE_OTHER): Payer: Medicare Other | Admitting: Family Medicine

## 2016-12-30 VITALS — BP 147/73 | HR 96 | Ht 64.0 in | Wt 166.4 lb

## 2016-12-30 DIAGNOSIS — I10 Essential (primary) hypertension: Secondary | ICD-10-CM

## 2016-12-30 DIAGNOSIS — I4891 Unspecified atrial fibrillation: Secondary | ICD-10-CM

## 2016-12-30 DIAGNOSIS — Z952 Presence of prosthetic heart valve: Secondary | ICD-10-CM

## 2016-12-30 LAB — COAGUCHEK XS/INR WAIVED
INR: 2.6 — ABNORMAL HIGH (ref 0.9–1.1)
PROTHROMBIN TIME: 30.9 s

## 2016-12-30 NOTE — Assessment & Plan Note (Signed)
The current medical regimen is effective;  continue present plan and medications.  

## 2016-12-30 NOTE — Progress Notes (Signed)
   BP (!) 147/73   Pulse 96   Ht '5\' 4"'$  (1.626 m)   Wt 166 lb 6.4 oz (75.5 kg)   SpO2 99%   BMI 28.56 kg/m    Subjective:    [name], male    DOB: 19-Aug-1941, 76 y.o.   MRN: [id number]  HPI: Kirk Jones is a 76 y.o. male  Chief Complaint  Patient presents with  . Follow-up  . Coagulation Disorder    2.6  30.9  Patient doing well bruising his back as he hopes for with INRs between 2.5 and 3.5. Patient's INR today 2.6. Patient with no other complaints issues isn't doing anything differently from other INR 7 to low to high. Our plan is been to trying to quit micromanage and discontinue this current dose as it swings through good control and doesn't get too bad or too low. HTN elevated but OK Cont meds Relevant past medical, surgical, family and social history reviewed and updated as indicated. Interim medical history since our last visit reviewed. Allergies and medications reviewed and updated.  Review of Systems  Constitutional: Negative.   Respiratory: Negative.   Cardiovascular: Negative.     Per HPI unless specifically indicated above     Objective:    BP (!) 147/73   Pulse 96   Ht '5\' 4"'$  (1.626 m)   Wt 166 lb 6.4 oz (75.5 kg)   SpO2 99%   BMI 28.56 kg/m   Wt Readings from Last 3 Encounters:  12/30/16 166 lb 6.4 oz (75.5 kg)  12/14/16 169 lb (76.7 kg)  11/16/16 167 lb (75.8 kg)    Physical Exam  Constitutional: He is oriented to person, place, and time. He appears well-developed and well-nourished. No distress.  HENT:  Head: Normocephalic and atraumatic.  Right Ear: Hearing normal.  Left Ear: Hearing normal.  Nose: Nose normal.  Eyes: Conjunctivae and lids are normal. Right eye exhibits no discharge. Left eye exhibits no discharge. No scleral icterus.  Cardiovascular:  stable  Pulmonary/Chest: Effort normal and breath sounds normal. No respiratory distress.  Musculoskeletal: Normal range of motion.  Neurological: He is alert  and oriented to person, place, and time.  Skin: Skin is intact. No rash noted.  Psychiatric: He has a normal mood and affect. His speech is normal and behavior is normal. Judgment and thought content normal. Cognition and memory are normal.    Results for orders placed or performed in visit on 12/14/16  CoaguChek XS/INR Waived (STAT)  Result Value Ref Range   INR 1.8 (H) 0.9 - 1.1   Prothrombin Time 21.5 sec      Assessment & Plan:   Problem List Items Addressed This Visit      Cardiovascular and Mediastinum   Atrial fibrillation ([place]) - Primary (Chronic)    The current medical regimen is effective;  continue present plan and medications.       Relevant Orders   CoaguChek XS/INR Waived   Hypertension    The current medical regimen is effective;  continue present plan and medications.         Other   History of prosthetic heart valve (Chronic)   Relevant Orders   CoaguChek XS/INR Waived       Follow up plan: Return in about 4 weeks (around 01/27/2017) for PT INR.

## 2017-01-04 DIAGNOSIS — I480 Paroxysmal atrial fibrillation: Secondary | ICD-10-CM | POA: Diagnosis not present

## 2017-01-27 ENCOUNTER — Encounter: Payer: Self-pay | Admitting: Family Medicine

## 2017-01-27 ENCOUNTER — Ambulatory Visit (INDEPENDENT_AMBULATORY_CARE_PROVIDER_SITE_OTHER): Payer: Medicare Other | Admitting: Family Medicine

## 2017-01-27 VITALS — BP 168/92 | HR 81 | Ht 64.0 in | Wt 166.0 lb

## 2017-01-27 DIAGNOSIS — I4891 Unspecified atrial fibrillation: Secondary | ICD-10-CM | POA: Diagnosis not present

## 2017-01-27 LAB — COAGUCHEK XS/INR WAIVED
INR: 3.5 — ABNORMAL HIGH (ref 0.9–1.1)
Prothrombin Time: 42.4 s

## 2017-01-27 NOTE — Progress Notes (Signed)
   BP (!) 168/92   Pulse 81   Ht '5\' 4"'$  (1.626 m)   Wt 166 lb (75.3 kg)   SpO2 97%   BMI 28.49 kg/m    Subjective:    [name], male    DOB: December 23, 1940, 76 y.o.   MRN: [id number]  HPI: Kirk Jones is a 76 y.o. male  Chief Complaint  Patient presents with  . Coagulation Disorder  . Leg Injury    Left leg, right below knee.    Small rash-like area on left lateral leg not inflamed. Patient's INR is in the normal range starting to have some bruising as he usually does when it's in the normal range. Taking medications faithfully without problems Relevant past medical, surgical, family and social history reviewed and updated as indicated. Interim medical history since our last visit reviewed. Allergies and medications reviewed and updated.  Review of Systems  Constitutional: Negative.   Respiratory: Negative.   Cardiovascular: Negative.     Per HPI unless specifically indicated above     Objective:    BP (!) 168/92   Pulse 81   Ht '5\' 4"'$  (1.626 m)   Wt 166 lb (75.3 kg)   SpO2 97%   BMI 28.49 kg/m   Wt Readings from Last 3 Encounters:  01/27/17 166 lb (75.3 kg)  12/30/16 166 lb 6.4 oz (75.5 kg)  12/14/16 169 lb (76.7 kg)    Physical Exam  Constitutional: He is oriented to person, place, and time. He appears well-developed and well-nourished.  HENT:  Head: Normocephalic and atraumatic.  Eyes: Conjunctivae and EOM are normal.  Neck: Normal range of motion.  Cardiovascular: Normal rate and regular rhythm.   Pulmonary/Chest: Effort normal and breath sounds normal.  Musculoskeletal: Normal range of motion.  Neurological: He is alert and oriented to person, place, and time.  Skin: No erythema.  Small eczema patch left leg  Psychiatric: He has a normal mood and affect. His behavior is normal. Judgment and thought content normal.    Results for orders placed or performed in visit on 12/30/16  CoaguChek XS/INR Waived  Result Value Ref Range    INR 2.6 (H) 0.9 - 1.1   Prothrombin Time 30.9 sec      Assessment & Plan:   Problem List Items Addressed This Visit      Cardiovascular and Mediastinum   Atrial fibrillation ([place]) - Primary (Chronic)    The current medical regimen is effective;  continue present plan and medications.       Relevant Orders   CoaguChek XS/INR Waived       Follow up plan: Return in about 4 weeks (around 02/24/2017) for PT INR.

## 2017-01-27 NOTE — Assessment & Plan Note (Signed)
The current medical regimen is effective;  continue present plan and medications.  

## 2017-02-10 DIAGNOSIS — Z9889 Other specified postprocedural states: Secondary | ICD-10-CM | POA: Diagnosis not present

## 2017-02-10 DIAGNOSIS — Z9861 Coronary angioplasty status: Secondary | ICD-10-CM | POA: Diagnosis not present

## 2017-02-10 DIAGNOSIS — I5022 Chronic systolic (congestive) heart failure: Secondary | ICD-10-CM | POA: Diagnosis not present

## 2017-02-10 DIAGNOSIS — I482 Chronic atrial fibrillation: Secondary | ICD-10-CM | POA: Diagnosis not present

## 2017-02-10 DIAGNOSIS — Z7901 Long term (current) use of anticoagulants: Secondary | ICD-10-CM | POA: Diagnosis not present

## 2017-02-10 DIAGNOSIS — Z95810 Presence of automatic (implantable) cardiac defibrillator: Secondary | ICD-10-CM | POA: Diagnosis not present

## 2017-02-10 DIAGNOSIS — I255 Ischemic cardiomyopathy: Secondary | ICD-10-CM | POA: Diagnosis not present

## 2017-02-10 DIAGNOSIS — I2510 Atherosclerotic heart disease of native coronary artery without angina pectoris: Secondary | ICD-10-CM | POA: Diagnosis not present

## 2017-02-10 DIAGNOSIS — I10 Essential (primary) hypertension: Secondary | ICD-10-CM | POA: Diagnosis not present

## 2017-02-10 DIAGNOSIS — E782 Mixed hyperlipidemia: Secondary | ICD-10-CM | POA: Diagnosis not present

## 2017-02-10 DIAGNOSIS — Z8673 Personal history of transient ischemic attack (TIA), and cerebral infarction without residual deficits: Secondary | ICD-10-CM | POA: Diagnosis not present

## 2017-02-10 DIAGNOSIS — N183 Chronic kidney disease, stage 3 (moderate): Secondary | ICD-10-CM | POA: Diagnosis not present

## 2017-02-18 DIAGNOSIS — Z86010 Personal history of colonic polyps: Secondary | ICD-10-CM | POA: Insufficient documentation

## 2017-02-18 DIAGNOSIS — K2270 Barrett's esophagus without dysplasia: Secondary | ICD-10-CM | POA: Diagnosis not present

## 2017-02-18 DIAGNOSIS — K219 Gastro-esophageal reflux disease without esophagitis: Secondary | ICD-10-CM | POA: Diagnosis not present

## 2017-02-22 ENCOUNTER — Other Ambulatory Visit: Payer: Self-pay | Admitting: Family Medicine

## 2017-02-22 NOTE — Telephone Encounter (Signed)
Pt requesting refill of Colchicine. Has appt with Merrie Roof tomorrow.

## 2017-02-22 NOTE — Telephone Encounter (Signed)
Phone call Discussed potential of drug interaction with colchicine and diltiazem. Patient also relates pain more above his knee and in the arch of his foot which he did atypical for gout. Patient also relates she's taken 5 pills over the last 2 days with no real relief of symptoms. Discussed not to take anymore colchicine keep appointment here tomorrow and observe symptoms.

## 2017-02-22 NOTE — Telephone Encounter (Signed)
Call pt 

## 2017-02-23 ENCOUNTER — Encounter: Payer: Self-pay | Admitting: Family Medicine

## 2017-02-23 ENCOUNTER — Ambulatory Visit (INDEPENDENT_AMBULATORY_CARE_PROVIDER_SITE_OTHER): Payer: Medicare Other | Admitting: Family Medicine

## 2017-02-23 VITALS — BP 119/79 | HR 121 | Temp 98.3°F | Wt 163.0 lb

## 2017-02-23 DIAGNOSIS — M79672 Pain in left foot: Secondary | ICD-10-CM | POA: Diagnosis not present

## 2017-02-23 DIAGNOSIS — Z952 Presence of prosthetic heart valve: Secondary | ICD-10-CM

## 2017-02-23 DIAGNOSIS — I4891 Unspecified atrial fibrillation: Secondary | ICD-10-CM

## 2017-02-23 LAB — COAGUCHEK XS/INR WAIVED
INR: 1.2 — ABNORMAL HIGH (ref 0.9–1.1)
PROTHROMBIN TIME: 13.9 s

## 2017-02-23 NOTE — Progress Notes (Signed)
BP 119/79   Pulse (!) 121   Temp 98.3 F (36.8 C)   Wt 163 lb (73.9 kg)   SpO2 96%   BMI 27.98 kg/m    Subjective:    [name], male    DOB: 11-11-1941, 76 y.o.   MRN: [id number]  HPI: Kirk Jones is a 76 y.o. male  Chief Complaint  Patient presents with  . Anticoagulation    taking '3mg'$  daily, skipped 2 doses last week due to bruising. Has been eating spinach.   Patient presents for INR f/u. Taking 3 mg daily, but started to have some significant forearm bruising so d/c'd his coumadin Thursday and Friday last week. Restarted on Saturday, but to help reduce the bruising he has been eating lots of spinach since Thursday.   Also c/o left foot pain x 2 days. Denies injury, hx of gout. Has taken a total of 6 colchicine tablets the past 2 days with fairly good relief. Still having some lingering pain.   Past Medical History:  Diagnosis Date  . Aortic valve regurgitation   . Atrial fibrillation ([place])   . Barrett's esophagus   . COPD (chronic obstructive pulmonary disease) ([place])   . Diverticulosis   . Gout   . Hematuria   . History of prosthetic heart valve   . Hyperlipidemia   . Hypertension   . Nodular prostate with urinary obstruction   . Substance abuse    alcohol   Social History   Social History  . Marital status: Married    Spouse name: N/A  . Number of children: N/A  . Years of education: N/A   Occupational History  . Not on file.   Social History Main Topics  . Smoking status: Former Smoker    Types: Cigarettes    Quit date: 04/24/1991  . Smokeless tobacco: Never Used  . Alcohol use Yes     Comment: quit December 2015/restarted  . Drug use: No  . Sexual activity: No   Other Topics Concern  . Not on file   Social History Narrative  . No narrative on file    Relevant past medical, surgical, family and social history reviewed and updated as indicated. Interim medical history since our last visit reviewed. Allergies and  medications reviewed and updated.  Review of Systems  Constitutional: Negative.   HENT: Negative.   Eyes: Negative.   Respiratory: Negative.   Cardiovascular: Negative.   Gastrointestinal: Negative.   Genitourinary: Negative.   Musculoskeletal: Positive for arthralgias.  Neurological: Negative.   Hematological: Bruises/bleeds easily.  Psychiatric/Behavioral: Negative.     Per HPI unless specifically indicated above     Objective:    BP 119/79   Pulse (!) 121   Temp 98.3 F (36.8 C)   Wt 163 lb (73.9 kg)   SpO2 96%   BMI 27.98 kg/m   Wt Readings from Last 3 Encounters:  02/23/17 163 lb (73.9 kg)  01/27/17 166 lb (75.3 kg)  12/30/16 166 lb 6.4 oz (75.5 kg)    Physical Exam  Constitutional: He is oriented to person, place, and time. He appears well-developed and well-nourished. No distress.  HENT:  Head: Atraumatic.  Eyes: Conjunctivae are normal. Pupils are equal, round, and reactive to light.  Neck: Normal range of motion. Neck supple.  Cardiovascular:  Tachycardic  Pulmonary/Chest: Breath sounds normal. No respiratory distress.  Musculoskeletal: Normal range of motion. He exhibits no edema.  Antalgic gait  Neurological: He is alert and  oriented to person, place, and time.  Skin: Skin is warm and dry.  Moderate bruising b/l forearms  Psychiatric: He has a normal mood and affect. His behavior is normal.    Results for orders placed or performed in visit on 01/27/17  CoaguChek XS/INR Waived  Result Value Ref Range   INR 3.5 (H) 0.9 - 1.1   Prothrombin Time 42.4 sec      Assessment & Plan:   Problem List Items Addressed This Visit      Cardiovascular and Mediastinum   Atrial fibrillation ([place]) - Primary (Chronic)    INR today not at goal, discussed resuming consistent use of 3 mg tablets and cutting out greens. Will have him f/u in 2 weeks to make sure things are returning to goal      Relevant Orders   CoaguChek XS/INR Waived     Other   History of  prosthetic heart valve (Chronic)   Relevant Orders   CoaguChek XS/INR Waived    Other Visit Diagnoses    Left foot pain       Will check uric acid level today, may need to consider some imaging if normal to r/o other potential causes. Continue 1-3 colchicine tabs daily in meantime   Relevant Orders   Uric acid       Follow up plan: Return in about 2 weeks (around 03/09/2017) for INR recheck.

## 2017-02-23 NOTE — Patient Instructions (Signed)
Take 3 mg daily

## 2017-02-23 NOTE — Assessment & Plan Note (Signed)
INR today not at goal, discussed resuming consistent use of 3 mg tablets and cutting out greens. Will have him f/u in 2 weeks to make sure things are returning to goal

## 2017-02-24 ENCOUNTER — Telehealth: Payer: Self-pay | Admitting: Family Medicine

## 2017-02-24 ENCOUNTER — Ambulatory Visit: Payer: Medicare Other | Admitting: Family Medicine

## 2017-02-24 LAB — URIC ACID: URIC ACID: 8.1 mg/dL (ref 3.7–8.6)

## 2017-02-24 MED ORDER — FEBUXOSTAT 40 MG PO TABS: 40.0000 mg | ORAL_TABLET | Freq: Every day | ORAL | 0 refills | Status: DC

## 2017-02-24 MED ORDER — COLCHICINE 0.6 MG PO TABS: ORAL_TABLET | ORAL | 0 refills | Status: DC

## 2017-02-24 NOTE — Telephone Encounter (Signed)
Patient notified of lab results and new medication and medication directions. He understood. Advised him we will recheck his labs in 2 weeks.

## 2017-02-24 NOTE — Telephone Encounter (Signed)
Please call pt and let him know that his uric acid is still elevated, so I want to start him on a new medicine called Uloric which is safe with all of his other medications. He should take 1 tablet daily of the 40 mg dose and when he comes back for his repeat INR in 2 weeks we will recheck uric acid to see if we need to increase to 80 mg. He should take 2 colchicine tablets daily for the next week while his body adjusts to the uloric.

## 2017-02-25 ENCOUNTER — Telehealth: Payer: Self-pay | Admitting: Family Medicine

## 2017-02-25 NOTE — Telephone Encounter (Signed)
Pharmacy called to let Dr Jeananne Rama know that the medications Colchicine and  Uloric had been approved for 1 year  Thanks

## 2017-02-25 NOTE — Telephone Encounter (Signed)
Routing to provider, FYI.  

## 2017-03-01 ENCOUNTER — Other Ambulatory Visit: Payer: Self-pay | Admitting: Family Medicine

## 2017-03-02 ENCOUNTER — Telehealth: Payer: Self-pay

## 2017-03-02 ENCOUNTER — Other Ambulatory Visit: Payer: Self-pay

## 2017-03-02 MED ORDER — COLCHICINE 0.6 MG PO TABS: ORAL_TABLET | ORAL | 0 refills | Status: DC

## 2017-03-02 NOTE — Telephone Encounter (Signed)
Uloric 40 mg approved until 02/24/18.

## 2017-03-02 NOTE — Telephone Encounter (Signed)
Patient just got his prescription for his Uloric today.  Needs a refill on his colchicine for 1 week to start with Uloric.

## 2017-03-03 ENCOUNTER — Other Ambulatory Visit: Payer: Self-pay | Admitting: Family Medicine

## 2017-03-09 ENCOUNTER — Encounter: Payer: Self-pay | Admitting: Family Medicine

## 2017-03-09 ENCOUNTER — Ambulatory Visit (INDEPENDENT_AMBULATORY_CARE_PROVIDER_SITE_OTHER): Payer: Medicare Other | Admitting: Family Medicine

## 2017-03-09 VITALS — BP 163/94 | HR 101 | Temp 98.4°F | Wt 159.0 lb

## 2017-03-09 DIAGNOSIS — I10 Essential (primary) hypertension: Secondary | ICD-10-CM

## 2017-03-09 DIAGNOSIS — M1A0720 Idiopathic chronic gout, left ankle and foot, without tophus (tophi): Secondary | ICD-10-CM

## 2017-03-09 DIAGNOSIS — I4891 Unspecified atrial fibrillation: Secondary | ICD-10-CM | POA: Diagnosis not present

## 2017-03-09 LAB — COAGUCHEK XS/INR WAIVED
INR: 3 — AB (ref 0.9–1.1)
PROTHROMBIN TIME: 36.5 s

## 2017-03-09 NOTE — Progress Notes (Signed)
BP (!) 163/94   Pulse (!) 101   Temp 98.4 F (36.9 C)   Wt 159 lb (72.1 kg)   SpO2 96%   BMI 27.29 kg/m    Subjective:    [name], male    DOB: 03/24/1941, 76 y.o.   MRN: [id number]  HPI: Kirk Jones is a 76 y.o. male  Chief Complaint  Patient presents with  . Coagulation Disorder   Patient presents for 2 week f/u INR. Has resumed daily 3 mg coumadin with no missed days, doing well with minimal bruising issues. Has been keeping diet stable. Previous INR was 1.2, and pt had been skipping doses and eating lots of greens to help with the increased bruising he was experiencing.   Notes that BP and pulse have been elevated the past 2 weeks despite faithful use of his heart medications. Pulse is consistently 110-120. Dr. Josefa Half had doubled the losartan to 50 mg several weeks ago but he became dizzy and fell shortly after this change, so the dose was backed down to 25 mg again. Not scheduled to go back for f/u for another 5 months or so.   Doing much better since starting Uloric for the left foot gout flare. Completed his 1 week colchicine bridge. No further pain or swelling in this area, and no noted side effects on the new medicine.   Past Medical History:  Diagnosis Date  . Aortic valve regurgitation   . Atrial fibrillation ([place])   . Barrett's esophagus   . COPD (chronic obstructive pulmonary disease) ([place])   . Diverticulosis   . Gout   . Hematuria   . History of prosthetic heart valve   . Hyperlipidemia   . Hypertension   . Nodular prostate with urinary obstruction   . Substance abuse    alcohol   Social History   Social History  . Marital status: Married    Spouse name: N/A  . Number of children: N/A  . Years of education: N/A   Occupational History  . Not on file.   Social History Main Topics  . Smoking status: Former Smoker    Types: Cigarettes    Quit date: 04/24/1991  . Smokeless tobacco: Never Used  . Alcohol use Yes   Comment: quit December 2015/restarted  . Drug use: No  . Sexual activity: No   Other Topics Concern  . Not on file   Social History Narrative  . No narrative on file    Relevant past medical, surgical, family and social history reviewed and updated as indicated. Interim medical history since our last visit reviewed. Allergies and medications reviewed and updated.  Review of Systems  Constitutional: Negative.   HENT: Negative.   Respiratory: Negative.  Negative for shortness of breath.   Cardiovascular: Negative.  Negative for palpitations.  Gastrointestinal: Negative.   Genitourinary: Negative.   Musculoskeletal: Negative.   Skin: Negative.   Neurological: Negative.   Hematological: Bruises/bleeds easily (stable, slightly improved from visit 2 weeks ago).  Psychiatric/Behavioral: Negative.     Per HPI unless specifically indicated above     Objective:    BP (!) 163/94   Pulse (!) 101   Temp 98.4 F (36.9 C)   Wt 159 lb (72.1 kg)   SpO2 96%   BMI 27.29 kg/m   Wt Readings from Last 3 Encounters:  03/09/17 159 lb (72.1 kg)  02/23/17 163 lb (73.9 kg)  01/27/17 166 lb (75.3 kg)    Physical  Exam  Constitutional: He is oriented to person, place, and time. He appears well-developed and well-nourished. No distress.  HENT:  Head: Atraumatic.  Eyes: Conjunctivae are normal. Pupils are equal, round, and reactive to light.  Neck: Normal range of motion. Neck supple.  Cardiovascular:  Tachycardic  Pulmonary/Chest: Effort normal and breath sounds normal. No respiratory distress.  Musculoskeletal: Normal range of motion. He exhibits no edema or tenderness.  Neurological: He is alert and oriented to person, place, and time.  Skin: Skin is warm and dry. No erythema.  Psychiatric: He has a normal mood and affect. His behavior is normal.  Nursing note and vitals reviewed.   Results for orders placed or performed in visit on 03/09/17  CoaguChek XS/INR Waived  Result Value  Ref Range   INR 3.0 (H) 0.9 - 1.1   Prothrombin Time 36.5 sec  Uric acid  Result Value Ref Range   Uric Acid 5.9 3.7 - 8.6 mg/dL  Basic Metabolic Panel (BMET)  Result Value Ref Range   Glucose 83 65 - 99 mg/dL   BUN 14 8 - 27 mg/dL   Creatinine, Ser 1.17 0.76 - 1.27 mg/dL   GFR calc non Af Amer 60 >59 mL/min/1.73   GFR calc Af Amer 70 >59 mL/min/1.73   BUN/Creatinine Ratio 12 10 - 24   Sodium 139 134 - 144 mmol/L   Potassium 5.3 (H) 3.5 - 5.2 mmol/L   Chloride 94 (L) 96 - 106 mmol/L   CO2 26 18 - 29 mmol/L   Calcium 10.0 8.6 - 10.2 mg/dL      Assessment & Plan:   Problem List Items Addressed This Visit      Cardiovascular and Mediastinum   Atrial fibrillation ([facility]CC) - Primary (Chronic)    INR back to goal at 3.0 today. Continue 3 mgs warfarin daily. Discussed elevated BP and HR, pt to call Cardiology and see about being evaluated soon given his recent dizziness and fall with medication adjustment and now persistently elevated pulse despite diltiazem and metoprolol. He will call if they cannot see him in the next week or two.       Relevant Orders   CoaguChek XS/INR Waived (Completed)   Hypertension    Not to goal, but pt fell when dose was increased recently. He will follow up with his Cardiologist in near future for this. Continue current regimen in meantime.         Other   Gout    Much improved, continue Uloric 40 mg. Recheck uric acid today. Continue lifestyle modifications to lower risks of gout flares additionally      Relevant Orders   Uric acid (Completed)   Basic Metabolic Panel (BMET) (Completed)       Follow up plan: Return in about 4 weeks (around 04/06/2017) for INR.

## 2017-03-10 ENCOUNTER — Telehealth: Payer: Self-pay | Admitting: Family Medicine

## 2017-03-10 LAB — BASIC METABOLIC PANEL
BUN / CREAT RATIO: 12 (ref 10–24)
BUN: 14 mg/dL (ref 8–27)
CO2: 26 mmol/L (ref 18–29)
CREATININE: 1.17 mg/dL (ref 0.76–1.27)
Calcium: 10 mg/dL (ref 8.6–10.2)
Chloride: 94 mmol/L — ABNORMAL LOW (ref 96–106)
GFR calc Af Amer: 70 mL/min/{1.73_m2} (ref >59)
GFR, EST NON AFRICAN AMERICAN: 60 mL/min/{1.73_m2} (ref >59)
GLUCOSE: 83 mg/dL (ref 65–99)
Potassium: 5.3 mmol/L — ABNORMAL HIGH (ref 3.5–5.2)
SODIUM: 139 mmol/L (ref 134–144)

## 2017-03-10 LAB — URIC ACID: Uric Acid: 5.9 mg/dL (ref 3.7–8.6)

## 2017-03-10 NOTE — Telephone Encounter (Signed)
Please call patient and let him know his labs came back great, his uric acid is now at goal on the uloric. Keep going with the 40 mg uloric daily

## 2017-03-11 NOTE — Patient Instructions (Signed)
Follow up with Cardiology as soon as possible

## 2017-03-11 NOTE — Assessment & Plan Note (Signed)
Much improved, continue Uloric 40 mg. Recheck uric acid today. Continue lifestyle modifications to lower risks of gout flares additionally

## 2017-03-11 NOTE — Assessment & Plan Note (Signed)
INR back to goal at 3.0 today. Continue 3 mgs warfarin daily. Discussed elevated BP and HR, pt to call Cardiology and see about being evaluated soon given his recent dizziness and fall with medication adjustment and now persistently elevated pulse despite diltiazem and metoprolol. He will call if they cannot see him in the next week or two.

## 2017-03-11 NOTE — Telephone Encounter (Signed)
Message relayed to patient. Verbalized understanding and denied questions.   

## 2017-03-11 NOTE — Assessment & Plan Note (Signed)
Not to goal, but pt fell when dose was increased recently. He will follow up with his Cardiologist in near future for this. Continue current regimen in meantime.

## 2017-03-13 ENCOUNTER — Other Ambulatory Visit: Payer: Self-pay | Admitting: Family Medicine

## 2017-03-23 DIAGNOSIS — I10 Essential (primary) hypertension: Secondary | ICD-10-CM | POA: Diagnosis not present

## 2017-03-23 DIAGNOSIS — R600 Localized edema: Secondary | ICD-10-CM | POA: Diagnosis not present

## 2017-03-23 DIAGNOSIS — N182 Chronic kidney disease, stage 2 (mild): Secondary | ICD-10-CM | POA: Diagnosis not present

## 2017-03-30 DIAGNOSIS — H401130 Primary open-angle glaucoma, bilateral, stage unspecified: Secondary | ICD-10-CM | POA: Diagnosis not present

## 2017-04-01 DIAGNOSIS — I5032 Chronic diastolic (congestive) heart failure: Secondary | ICD-10-CM | POA: Diagnosis not present

## 2017-04-01 DIAGNOSIS — I255 Ischemic cardiomyopathy: Secondary | ICD-10-CM | POA: Diagnosis not present

## 2017-04-01 DIAGNOSIS — I480 Paroxysmal atrial fibrillation: Secondary | ICD-10-CM | POA: Diagnosis not present

## 2017-04-01 DIAGNOSIS — Z952 Presence of prosthetic heart valve: Secondary | ICD-10-CM | POA: Diagnosis not present

## 2017-04-01 DIAGNOSIS — Z95810 Presence of automatic (implantable) cardiac defibrillator: Secondary | ICD-10-CM | POA: Diagnosis not present

## 2017-04-01 DIAGNOSIS — I2510 Atherosclerotic heart disease of native coronary artery without angina pectoris: Secondary | ICD-10-CM | POA: Diagnosis not present

## 2017-04-05 DIAGNOSIS — I480 Paroxysmal atrial fibrillation: Secondary | ICD-10-CM | POA: Diagnosis not present

## 2017-04-06 ENCOUNTER — Ambulatory Visit (INDEPENDENT_AMBULATORY_CARE_PROVIDER_SITE_OTHER): Payer: Medicare Other | Admitting: Family Medicine

## 2017-04-06 ENCOUNTER — Encounter: Payer: Self-pay | Admitting: Family Medicine

## 2017-04-06 VITALS — BP 159/87 | HR 107 | Temp 98.3°F | Wt 158.0 lb

## 2017-04-06 DIAGNOSIS — M1A9XX0 Chronic gout, unspecified, without tophus (tophi): Secondary | ICD-10-CM | POA: Diagnosis not present

## 2017-04-06 DIAGNOSIS — I4891 Unspecified atrial fibrillation: Secondary | ICD-10-CM

## 2017-04-06 DIAGNOSIS — Z952 Presence of prosthetic heart valve: Secondary | ICD-10-CM | POA: Diagnosis not present

## 2017-04-06 LAB — COAGUCHEK XS/INR WAIVED
INR: 2.2 — ABNORMAL HIGH (ref 0.9–1.1)
Prothrombin Time: 26.6 s

## 2017-04-06 MED ORDER — FEBUXOSTAT 40 MG PO TABS: 40.0000 mg | ORAL_TABLET | Freq: Every day | ORAL | 3 refills | Status: DC

## 2017-04-06 NOTE — Patient Instructions (Signed)
Alternate daily from 3 mg of coumadin to 3.5 mg.

## 2017-04-06 NOTE — Progress Notes (Signed)
BP (!) 159/87   Pulse (!) 107   Temp 98.3 F (36.8 C)   Wt 158 lb (71.7 kg)   SpO2 97%   BMI 27.12 kg/m    Subjective:    [name], male    DOB: Mar 07, 1941, 76 y.o.   MRN: [id number]  HPI: Kirk Jones is a 76 y.o. male  Chief Complaint  Patient presents with  . Anticoagulation    Patient taking '3mg'$  every day  . Medication Refill    He needs a refill on Uloric if he is supposed to take it everyday.   Patient presents for 1 month INR recheck. Taking 3 mg daily. No missed doses, bleeding or bruising issues, diet changes, or new medicines.   No gout flares since starting uloric. Doing well on the medication. No reported side effects, taking faithfully. Uric acid checked last month and at goal.   Relevant past medical, surgical, family and social history reviewed and updated as indicated. Interim medical history since our last visit reviewed. Allergies and medications reviewed and updated.  Review of Systems  Constitutional: Negative.   HENT: Negative.   Eyes: Negative.   Respiratory: Negative.   Cardiovascular: Negative.   Genitourinary: Negative.   Musculoskeletal: Negative.   Neurological: Negative.   Hematological: Does not bruise/bleed easily.  Psychiatric/Behavioral: Negative.    Per HPI unless specifically indicated above     Objective:    BP (!) 159/87   Pulse (!) 107   Temp 98.3 F (36.8 C)   Wt 158 lb (71.7 kg)   SpO2 97%   BMI 27.12 kg/m   Wt Readings from Last 3 Encounters:  04/06/17 158 lb (71.7 kg)  03/09/17 159 lb (72.1 kg)  02/23/17 163 lb (73.9 kg)    Physical Exam  Constitutional: He is oriented to person, place, and time. He appears well-developed and well-nourished. No distress.  HENT:  Head: Atraumatic.  Eyes: Conjunctivae are normal. No scleral icterus.  Neck: Normal range of motion. Neck supple.  Cardiovascular: Normal rate.   Pulmonary/Chest: Effort normal. No respiratory distress.  Musculoskeletal:  Normal range of motion.  Neurological: He is alert and oriented to person, place, and time.  Skin: Skin is warm and dry.  Psychiatric: He has a normal mood and affect. His behavior is normal.  Nursing note and vitals reviewed.   Results for orders placed or performed in visit on 04/06/17  CoaguChek XS/INR Waived  Result Value Ref Range   INR 2.2 (H) 0.9 - 1.1   Prothrombin Time 26.6 sec      Assessment & Plan:   Problem List Items Addressed This Visit      Cardiovascular and Mediastinum   Atrial fibrillation ([place]) - Primary (Chronic)   Relevant Orders   CoaguChek XS/INR Waived (Completed)     Other   History of prosthetic heart valve (Chronic)    INR not at goal today, 2.2 down from 3.0 previously. Will increase slightly to alternating doses of 3 mg and 3.5 mg daily. Pt states he has 1 mg tabs at home to split in half. Recheck in 2 weeks. Counseled to not adjust his greens in his diet and to let us know if any medication changes otherwise in meantime.       Gout    Continue uloric, no recurrences since being on it and uric acid is a goal.          Follow up plan: Return in about 2 weeks (  around 04/20/2017) for INR.

## 2017-04-06 NOTE — Assessment & Plan Note (Signed)
INR not at goal today, 2.2 down from 3.0 previously. Will increase slightly to alternating doses of 3 mg and 3.5 mg daily. Pt states he has 1 mg tabs at home to split in half. Recheck in 2 weeks. Counseled to not adjust his greens in his diet and to let us know if any medication changes otherwise in meantime.

## 2017-04-06 NOTE — Assessment & Plan Note (Signed)
Continue uloric, no recurrences since being on it and uric acid is a goal.

## 2017-04-18 DIAGNOSIS — Z86010 Personal history of colonic polyps: Secondary | ICD-10-CM | POA: Diagnosis not present

## 2017-04-20 ENCOUNTER — Ambulatory Visit (INDEPENDENT_AMBULATORY_CARE_PROVIDER_SITE_OTHER): Payer: Medicare Other | Admitting: Family Medicine

## 2017-04-20 ENCOUNTER — Encounter: Payer: Self-pay | Admitting: Family Medicine

## 2017-04-20 VITALS — BP 127/72 | HR 100 | Temp 98.5°F | Ht 63.0 in | Wt 157.0 lb

## 2017-04-20 DIAGNOSIS — Z952 Presence of prosthetic heart valve: Secondary | ICD-10-CM

## 2017-04-20 DIAGNOSIS — I4891 Unspecified atrial fibrillation: Secondary | ICD-10-CM | POA: Diagnosis not present

## 2017-04-20 LAB — COAGUCHEK XS/INR WAIVED
INR: 2.5 — AB (ref 0.9–1.1)
PROTHROMBIN TIME: 30.4 s

## 2017-04-20 NOTE — Progress Notes (Signed)
   BP 127/72   Pulse 100   Temp 98.5 F (36.9 C)   Ht '5\' 3"'$  (1.6 m)   Wt 157 lb (71.2 kg)   SpO2 94%   BMI 27.81 kg/m    Subjective:    [name], male    DOB: 06-29-1941, 76 y.o.   MRN: [id number]  HPI: Kirk Jones is a 76 y.o. male  Chief Complaint  Patient presents with  . Anticoagulation    alternating between '3mg'$  and 3.'5mg'$ .   Patient presents for 2 week f/u INR. Previous INR was slightly low at 2.2, dose changed to alternating 3 mg and 3.5 mg. Doing well with this dose, no bleeding or bruising issues noted. No recent medication changes, diet changes, CP, palpitations.   Relevant past medical, surgical, family and social history reviewed and updated as indicated. Interim medical history since our last visit reviewed. Allergies and medications reviewed and updated.  Review of Systems  Constitutional: Negative.   HENT: Negative.   Eyes: Negative.   Respiratory: Negative.   Cardiovascular: Negative.   Gastrointestinal: Negative.   Genitourinary: Negative.   Musculoskeletal: Negative.   Neurological: Negative.   Hematological: Does not bruise/bleed easily.  Psychiatric/Behavioral: Negative.     Per HPI unless specifically indicated above     Objective:    BP 127/72   Pulse 100   Temp 98.5 F (36.9 C)   Ht '5\' 3"'$  (1.6 m)   Wt 157 lb (71.2 kg)   SpO2 94%   BMI 27.81 kg/m   Wt Readings from Last 3 Encounters:  04/20/17 157 lb (71.2 kg)  04/06/17 158 lb (71.7 kg)  03/09/17 159 lb (72.1 kg)    Physical Exam  Constitutional: He is oriented to person, place, and time. He appears well-developed and well-nourished. No distress.  HENT:  Head: Atraumatic.  Eyes: Conjunctivae are normal. No scleral icterus.  Neck: Normal range of motion. Neck supple.  Cardiovascular: Normal rate and normal heart sounds.   Pulmonary/Chest: Effort normal and breath sounds normal. No respiratory distress.  Musculoskeletal: Normal range of motion.    Neurological: He is alert and oriented to person, place, and time.  Skin: Skin is warm and dry.  Psychiatric: He has a normal mood and affect. His behavior is normal.  Nursing note and vitals reviewed.   Results for orders placed or performed in visit on 04/20/17  CoaguChek XS/INR Waived  Result Value Ref Range   INR 2.5 (H) 0.9 - 1.1   Prothrombin Time 30.4 sec      Assessment & Plan:   Problem List Items Addressed This Visit      Cardiovascular and Mediastinum   Atrial fibrillation ([place]) - Primary (Chronic)    INR at goal today at 2.5. Continue current regimen of 3 mg and 3.5 mg alternating. F/u in 1 month for recheck      Relevant Orders   CoaguChek XS/INR Waived (Completed)     Other   History of prosthetic heart valve (Chronic)   Relevant Orders   CoaguChek XS/INR Waived (Completed)       Follow up plan: Return in about 4 weeks (around 05/18/2017) for INR.

## 2017-04-21 NOTE — Assessment & Plan Note (Signed)
INR at goal today at 2.5. Continue current regimen of 3 mg and 3.5 mg alternating. F/u in 1 month for recheck

## 2017-05-12 ENCOUNTER — Ambulatory Visit (INDEPENDENT_AMBULATORY_CARE_PROVIDER_SITE_OTHER): Payer: Medicare Other | Admitting: Family Medicine

## 2017-05-12 ENCOUNTER — Encounter: Payer: Self-pay | Admitting: Family Medicine

## 2017-05-12 ENCOUNTER — Ambulatory Visit: Payer: Medicare Other | Admitting: Family Medicine

## 2017-05-12 VITALS — BP 166/91 | HR 105 | Temp 98.5°F | Wt 155.0 lb

## 2017-05-12 DIAGNOSIS — I4891 Unspecified atrial fibrillation: Secondary | ICD-10-CM

## 2017-05-12 DIAGNOSIS — Z952 Presence of prosthetic heart valve: Secondary | ICD-10-CM | POA: Diagnosis not present

## 2017-05-12 DIAGNOSIS — R600 Localized edema: Secondary | ICD-10-CM | POA: Diagnosis not present

## 2017-05-12 LAB — COAGUCHEK XS/INR WAIVED
INR: 2 — ABNORMAL HIGH (ref 0.9–1.1)
Prothrombin Time: 23.8 s

## 2017-05-12 NOTE — Patient Instructions (Signed)
Take 3.5 mg coumadin daily Increase your furosemide to 40 mg twice daily for 3 days then return to regular dosing

## 2017-05-12 NOTE — Assessment & Plan Note (Signed)
Discussed to keep a close eye on his HR, which the past few days has been high 90s low 100s and to call Cardiology if not improving. INR is down to 2.0 today. Will increase to 3.5 daily and recheck in 2 weeks. Reviewed keeping diet as stable as possible in meantime. F/u with bleeding/bruising issues prior to next appt.

## 2017-05-12 NOTE — Progress Notes (Signed)
BP (!) 166/91   Pulse (!) 105   Temp 98.5 F (36.9 C)   Wt 155 lb (70.3 kg)   SpO2 99%   BMI 27.46 kg/m    Subjective:    [name], male    DOB: 05-11-1941, 76 y.o.   MRN: [id number]  HPI: Kirk Jones is a 76 y.o. male  Chief Complaint  Patient presents with  . Anticoagulation    alternating between 3mg  and 3.5mg .  . Foot Pain    bilateral, swelling and painful all over all the way up to his ankles. When he stands on it, he has tingling.   Patient presents for INR recheck. Alternating 3 mg and 3.5 mg with his coumadin. Diet has not been steady lately, but no medication changes. No bleeding or bruising issues noted. Previous INR was 2.5.   Having several days of b/l foot pain and swelling, much worse on the right. Trying foot soaks, elevation, and tylenol extra strength with some relief. Taking 40 mg lasix daily. This has happened to him before in the past and went away with time. Does have hx of gout but under good control with uloric.   Past Medical History:  Diagnosis Date  . Aortic valve regurgitation   . Atrial fibrillation ([place])   . Barrett's esophagus   . COPD (chronic obstructive pulmonary disease) ([place])   . Diverticulosis   . Gout   . Hematuria   . History of prosthetic heart valve   . Hyperlipidemia   . Hypertension   . Nodular prostate with urinary obstruction   . Substance abuse    alcohol   Social History   Social History  . Marital status: Married    Spouse name: N/A  . Number of children: N/A  . Years of education: N/A   Occupational History  . Not on file.   Social History Main Topics  . Smoking status: Former Smoker    Types: Cigarettes    Quit date: 04/24/1991  . Smokeless tobacco: Never Used  . Alcohol use Yes     Comment: quit December 2015/restarted  . Drug use: No  . Sexual activity: No   Other Topics Concern  . Not on file   Social History Narrative  . No narrative on file    Relevant past  medical, surgical, family and social history reviewed and updated as indicated. Interim medical history since our last visit reviewed. Allergies and medications reviewed and updated.  Review of Systems  Constitutional: Negative.   Respiratory: Negative.   Cardiovascular: Negative.   Gastrointestinal: Negative.   Genitourinary: Negative.   Musculoskeletal: Positive for arthralgias and joint swelling.  Skin: Positive for color change (tops of feet red - pt states he used too hot of water when doing a foot soak).  Neurological: Negative.   Psychiatric/Behavioral: Negative.    Per HPI unless specifically indicated above     Objective:    BP (!) 166/91   Pulse (!) 105   Temp 98.5 F (36.9 C)   Wt 155 lb (70.3 kg)   SpO2 99%   BMI 27.46 kg/m   Wt Readings from Last 3 Encounters:  05/12/17 155 lb (70.3 kg)  04/20/17 157 lb (71.2 kg)  04/06/17 158 lb (71.7 kg)    Physical Exam  Constitutional: He is oriented to person, place, and time. He appears well-developed and well-nourished. No distress.  HENT:  Head: Atraumatic.  Eyes: Conjunctivae are normal. Pupils are equal, round,  and reactive to light.  Neck: Normal range of motion. Neck supple.  Cardiovascular:  Tachycardic  Pulmonary/Chest: Effort normal and breath sounds normal. No respiratory distress.  Musculoskeletal: Normal range of motion. He exhibits edema (Right foot worse than left) and tenderness (Mild ttp over b/l feet).  - homans sign b/l LEs  Neurological: He is alert and oriented to person, place, and time.  Skin: Skin is warm and dry. There is erythema (patches of mild erythema on tops of feet - pt explained that he used too hot of water to soak feet).  Psychiatric: He has a normal mood and affect. His behavior is normal.  Nursing note and vitals reviewed.   Results for orders placed or performed in visit on 05/12/17  CoaguChek XS/INR Waived  Result Value Ref Range   INR 2.0 (H) 0.9 - 1.1   Prothrombin Time  23.8 sec      Assessment & Plan:   Problem List Items Addressed This Visit      Cardiovascular and Mediastinum   Atrial fibrillation ([place]) - Primary (Chronic)    Discussed to keep a close eye on his HR, which the past few days has been high 90s low 100s and to call Cardiology if not improving. INR is down to 2.0 today. Will increase to 3.5 daily and recheck in 2 weeks. Reviewed keeping diet as stable as possible in meantime. F/u with bleeding/bruising issues prior to next appt.       Relevant Orders   CoaguChek XS/INR Waived (Completed)     Other   History of prosthetic heart valve (Chronic)   Relevant Orders   CoaguChek XS/INR Waived (Completed)    Other Visit Diagnoses    Edema of foot       Increase lasix to 40 mg BID (as written) x 3 days, elevate legs, compression hose. Avoid salty foods. Tylenol prn. F/u if worsening or no improvement.     Discussed strict return precautions, pt understanding and agreeable to plan.   Follow up plan: Return in about 2 weeks (around 05/26/2017) for INR.

## 2017-05-23 DIAGNOSIS — H401130 Primary open-angle glaucoma, bilateral, stage unspecified: Secondary | ICD-10-CM | POA: Diagnosis not present

## 2017-05-26 ENCOUNTER — Encounter: Payer: Self-pay | Admitting: Family Medicine

## 2017-05-26 ENCOUNTER — Ambulatory Visit (INDEPENDENT_AMBULATORY_CARE_PROVIDER_SITE_OTHER): Payer: Medicare Other | Admitting: Family Medicine

## 2017-05-26 VITALS — BP 156/77 | HR 107 | Temp 98.6°F | Wt 157.0 lb

## 2017-05-26 DIAGNOSIS — Z952 Presence of prosthetic heart valve: Secondary | ICD-10-CM | POA: Diagnosis not present

## 2017-05-26 DIAGNOSIS — I4891 Unspecified atrial fibrillation: Secondary | ICD-10-CM | POA: Diagnosis not present

## 2017-05-26 NOTE — Progress Notes (Signed)
   BP (!) 156/77   Pulse (!) 107   Temp 98.6 F (37 C)   Wt 157 lb (71.2 kg)   SpO2 96%   BMI 27.81 kg/m    Subjective:    [name], male    DOB: 1940/12/16, 76 y.o.   MRN: [id number]  HPI: Garl Speigner is a 76 y.o. male  Chief Complaint  Patient presents with  . Anticoagulation    Taking 3.5mg  everyday.  . Foot Pain    recheck, he states they are feeling better   Patient presents today for 2 week INR f/u. Previous INR was 2.0. Taking 3.5 mg daily without bleeding or bruising issues. No diet or medication changes. Denies CP, SOB.   Relevant past medical, surgical, family and social history reviewed and updated as indicated. Interim medical history since our last visit reviewed. Allergies and medications reviewed and updated.  Review of Systems  Constitutional: Negative.   HENT: Negative.   Respiratory: Negative.   Cardiovascular: Negative.   Gastrointestinal: Negative.   Musculoskeletal: Negative.   Neurological: Negative.   Hematological: Bruises/bleeds easily (at baseline).  Psychiatric/Behavioral: Negative.    Per HPI unless specifically indicated above     Objective:    BP (!) 156/77   Pulse (!) 107   Temp 98.6 F (37 C)   Wt 157 lb (71.2 kg)   SpO2 96%   BMI 27.81 kg/m   Wt Readings from Last 3 Encounters:  05/26/17 157 lb (71.2 kg)  05/12/17 155 lb (70.3 kg)  04/20/17 157 lb (71.2 kg)    Physical Exam  Constitutional: He is oriented to person, place, and time. He appears well-developed and well-nourished. No distress.  HENT:  Head: Atraumatic.  Eyes: Conjunctivae are normal. Pupils are equal, round, and reactive to light.  Neck: Normal range of motion. Neck supple.  Cardiovascular:  Slightly tachycardic  Musculoskeletal: Normal range of motion.  Neurological: He is alert and oriented to person, place, and time.  Skin: Skin is warm and dry.  Bruising diffusely over UEs, baseline for patient  Psychiatric: He has a  normal mood and affect. His behavior is normal.  Nursing note and vitals reviewed.   Results for orders placed or performed in visit on 05/12/17  CoaguChek XS/INR Waived  Result Value Ref Range   INR 2.0 (H) 0.9 - 1.1   Prothrombin Time 23.8 sec      Assessment & Plan:   Problem List Items Addressed This Visit      Cardiovascular and Mediastinum   Atrial fibrillation ([place]) - Primary (Chronic)    INR machine down, will draw send out specimen. Await results. Will adjust as needed from there. Continue 3.5 mg daily until then.       Relevant Orders   INR/PT     Other   History of prosthetic heart valve (Chronic)   Relevant Orders   INR/PT       Follow up plan: Return for depending on results.

## 2017-05-26 NOTE — Assessment & Plan Note (Signed)
INR machine down, will draw send out specimen. Await results. Will adjust as needed from there. Continue 3.5 mg daily until then.

## 2017-05-27 ENCOUNTER — Telehealth: Payer: Self-pay | Admitting: Family Medicine

## 2017-05-27 LAB — PROTIME-INR
INR: 3.1 — AB (ref 0.8–1.2)
PROTHROMBIN TIME: 30.6 s — AB (ref 9.1–12.0)

## 2017-05-27 NOTE — Telephone Encounter (Signed)
Patient notified

## 2017-05-27 NOTE — Telephone Encounter (Signed)
Please call and let him know that his INR has normalized to 3.1, continue as he has been and follow up in 1 month unless he needs [place] sooner.

## 2017-06-05 ENCOUNTER — Encounter: Payer: Self-pay | Admitting: *Deleted

## 2017-06-05 ENCOUNTER — Ambulatory Visit: Payer: Medicare Other

## 2017-06-05 ENCOUNTER — Emergency Department
Admission: EM | Admit: 2017-06-05 | Discharge: 2017-06-05 | Disposition: A | Discharge status: Home / Self Care | Payer: Medicare Other | Attending: Emergency Medicine | Admitting: Emergency Medicine

## 2017-06-05 ENCOUNTER — Ambulatory Visit (INDEPENDENT_AMBULATORY_CARE_PROVIDER_SITE_OTHER)
Admission: EM | Admit: 2017-06-05 | Discharge: 2017-06-05 | Disposition: A | Discharge status: Home / Self Care | Payer: Medicare Other | Source: Home / Self Care | Attending: Family Medicine | Admitting: Family Medicine

## 2017-06-05 DIAGNOSIS — Z7982 Long term (current) use of aspirin: Secondary | ICD-10-CM | POA: Insufficient documentation

## 2017-06-05 DIAGNOSIS — S51012D Laceration without foreign body of left elbow, subsequent encounter: Secondary | ICD-10-CM | POA: Insufficient documentation

## 2017-06-05 DIAGNOSIS — W19XXXD Unspecified fall, subsequent encounter: Secondary | ICD-10-CM | POA: Diagnosis not present

## 2017-06-05 DIAGNOSIS — M79632 Pain in left forearm: Secondary | ICD-10-CM | POA: Diagnosis not present

## 2017-06-05 DIAGNOSIS — S50912D Unspecified superficial injury of left forearm, subsequent encounter: Secondary | ICD-10-CM | POA: Diagnosis present

## 2017-06-05 DIAGNOSIS — R936 Abnormal findings on diagnostic imaging of limbs: Secondary | ICD-10-CM

## 2017-06-05 DIAGNOSIS — Z87891 Personal history of nicotine dependence: Secondary | ICD-10-CM | POA: Insufficient documentation

## 2017-06-05 DIAGNOSIS — T148XXA Other injury of unspecified body region, initial encounter: Secondary | ICD-10-CM | POA: Insufficient documentation

## 2017-06-05 DIAGNOSIS — S59912A Unspecified injury of left forearm, initial encounter: Secondary | ICD-10-CM | POA: Diagnosis not present

## 2017-06-05 DIAGNOSIS — S51012A Laceration without foreign body of left elbow, initial encounter: Secondary | ICD-10-CM

## 2017-06-05 DIAGNOSIS — N183 Chronic kidney disease, stage 3 (moderate): Secondary | ICD-10-CM | POA: Insufficient documentation

## 2017-06-05 DIAGNOSIS — W19XXXA Unspecified fall, initial encounter: Secondary | ICD-10-CM

## 2017-06-05 DIAGNOSIS — Z952 Presence of prosthetic heart valve: Secondary | ICD-10-CM | POA: Insufficient documentation

## 2017-06-05 DIAGNOSIS — R937 Abnormal findings on diagnostic imaging of other parts of musculoskeletal system: Secondary | ICD-10-CM

## 2017-06-05 DIAGNOSIS — M25422 Effusion, left elbow: Secondary | ICD-10-CM | POA: Diagnosis not present

## 2017-06-05 DIAGNOSIS — I4891 Unspecified atrial fibrillation: Secondary | ICD-10-CM | POA: Insufficient documentation

## 2017-06-05 DIAGNOSIS — Z79899 Other long term (current) drug therapy: Secondary | ICD-10-CM | POA: Insufficient documentation

## 2017-06-05 DIAGNOSIS — S51812A Laceration without foreign body of left forearm, initial encounter: Secondary | ICD-10-CM

## 2017-06-05 DIAGNOSIS — Z7901 Long term (current) use of anticoagulants: Secondary | ICD-10-CM | POA: Insufficient documentation

## 2017-06-05 DIAGNOSIS — T07XXXA Unspecified multiple injuries, initial encounter: Secondary | ICD-10-CM

## 2017-06-05 DIAGNOSIS — J449 Chronic obstructive pulmonary disease, unspecified: Secondary | ICD-10-CM | POA: Diagnosis not present

## 2017-06-05 DIAGNOSIS — I2510 Atherosclerotic heart disease of native coronary artery without angina pectoris: Secondary | ICD-10-CM | POA: Insufficient documentation

## 2017-06-05 DIAGNOSIS — I129 Hypertensive chronic kidney disease with stage 1 through stage 4 chronic kidney disease, or unspecified chronic kidney disease: Secondary | ICD-10-CM | POA: Diagnosis not present

## 2017-06-05 DIAGNOSIS — S81012A Laceration without foreign body, left knee, initial encounter: Secondary | ICD-10-CM | POA: Diagnosis not present

## 2017-06-05 DIAGNOSIS — S8992XA Unspecified injury of left lower leg, initial encounter: Secondary | ICD-10-CM | POA: Diagnosis not present

## 2017-06-05 MED ORDER — CEPHALEXIN 500 MG PO CAPS: 500.0000 mg | ORAL_CAPSULE | Freq: Three times a day (TID) | ORAL | 0 refills | Status: DC

## 2017-06-05 MED ORDER — CEPHALEXIN 250 MG PO CAPS
250.0000 mg | ORAL_CAPSULE | Freq: Once | ORAL | Status: AC
  Administered 2017-06-05: 250 mg via ORAL
  Filled 2017-06-05: qty 1

## 2017-06-05 MED ORDER — MUPIROCIN 2 % EX OINT: 1.0000 "application " | TOPICAL_OINTMENT | Freq: Two times a day (BID) | CUTANEOUS | 0 refills | Status: DC

## 2017-06-05 NOTE — ED Triage Notes (Signed)
Pt reports having fallen last night after tripping. Pt denies LOC or hitting head. Pt has multiple skin tears to both forearms and knees. Pt is currently on Warfarin and reports he was still bleeding when he changes the dressing today. No deformities noted. Pt ambulatory without assistance.

## 2017-06-05 NOTE — ED Notes (Signed)
S/p fall on concrete. Pt on warfarin. Left forearm wound would not stop bleeding at home. Per pt skin tear. Dressing applied in triage with small amount blood leaking thru. Fingers distal warm. +2 pulse radial. Pt also with dressings left knee and right forearm with bleeding controlled.

## 2017-06-05 NOTE — Discharge Instructions (Signed)
Please see PCP next week for possible MRI of the elbow if is still causing pain and discomfort.

## 2017-06-05 NOTE — ED Provider Notes (Addendum)
[facility]    CSN: [id number] Arrival date & time: 06/05/17  0815     History   Chief Complaint Chief Complaint  Patient presents with  . Fall    HPI Kirk Jones is a 76 y.o. male.   Patient is a 76 year old white male who fell last night outside about 11:00. He states no head injury but he landed on his left knee elbow and left forearm. He had multiple abrasions there which he states that his wife trimmed off since then he is on a blood thinner because he has an artificial valve his PT was checked last 10 days and was therapeutic at 3.1 and he tells me the range was between 2.5 and 3.5. The wound has been oozing since the accident. And he has pain in his elbow and in his knee. He's had a history of COPD gout versus esophagitis 8 fibrillation and aortic valve replacement hyperlipidemia hypertension and hematuria. Surgery includes vasectomy and cataract extraction cough, malaise and open heart surgery.   The history is provided by the patient. No language interpreter was used.  Fall  This is a new problem. The current episode started yesterday. The problem has not changed since onset.Pertinent negatives include no chest pain, no abdominal pain, no headaches and no shortness of breath. Nothing relieves the symptoms. He has tried nothing for the symptoms. The treatment provided no relief.    Past Medical History:  Diagnosis Date  . Aortic valve regurgitation   . Atrial fibrillation ([place])   . Barrett's esophagus   . COPD (chronic obstructive pulmonary disease) ([place])   . Diverticulosis   . Gout   . Hematuria   . History of prosthetic heart valve   . Hyperlipidemia   . Hypertension   . Nodular prostate with urinary obstruction   . Substance abuse    alcohol    Patient Active Problem List   Diagnosis Date Noted  . CAD (coronary artery disease) 10/06/2015  . Pacemaker 10/06/2015  . Automatic implantable cardioverter-defibrillator in situ 10/06/2015  .  Nodular prostate with urinary obstruction 10/06/2015  . Senile purpura ([place]) 10/06/2015  . Barrett's esophagus 05/28/2015  . Hypertension 05/28/2015  . COPD (chronic obstructive pulmonary disease) ([place]) 05/28/2015  . Hyperlipidemia 05/28/2015  . Diverticulosis 05/28/2015  . CKD (chronic kidney disease), stage III 05/28/2015  . Gout 05/28/2015  . History of prosthetic heart valve 05/13/2015  . Atrial fibrillation ([place]) 05/13/2015    Past Surgical History:  Procedure Laterality Date  . CARPAL TUNNEL RELEASE Right 2015  . CATARACT EXTRACTION Right 2017  . VASECTOMY         Home Medications    Prior to Admission medications   Medication Sig Start Date End Date Taking? Authorizing Provider  acetaminophen (TYLENOL) 500 MG tablet Take 500 mg by mouth 3 (three) times daily.   Yes [provider]  aspirin EC 81 MG tablet Take 81 mg by mouth daily.   Yes [provider]  diltiazem (DILACOR XR) 180 MG 24 hr capsule TAKE 1 CAPSULE BY MOUTH ONCE DAILY 11/25/16  Yes Johnson, Megan P, DO  febuxostat (ULORIC) 40 MG tablet Take 1 tablet (40 mg total) by mouth daily. 04/06/17  Yes Volney American, PA-C  ferrous sulfate 325 (65 FE) MG tablet Take 325 mg by mouth once a week. Reported on 04/05/2016   Yes [provider]  fexofenadine (ALLEGRA) 180 MG tablet Take 180 mg by mouth daily.   Yes [provider]  fluticasone (FLONASE) 50 MCG/ACT nasal spray Place 2 sprays into both nostrils daily. 10/11/16  Yes Crissman, Jeannette How, MD  furosemide (LASIX) 40 MG tablet Take 40 mg by mouth daily.    Yes [provider]  losartan (COZAAR) 25 MG tablet Take 1 tablet (25 mg total) by mouth daily. 10/11/16  Yes Crissman, Jeannette How, MD  magnesium 30 MG tablet Take 30 mg by mouth daily.   Yes [provider]  metoprolol succinate (TOPROL-XL) 50 MG 24 hr tablet Take 1 tablet (50 mg total) by mouth 2 (two) times daily. Take with or immediately following a meal.  10/11/16  Yes Crissman, Jeannette How, MD  Multiple Vitamins-Minerals (CENTRUM ADULTS PO) Take by mouth daily.   Yes [provider]  omeprazole (PRILOSEC) 20 MG capsule Take 1 capsule (20 mg total) by mouth 2 (two) times daily before a meal. 10/11/16  Yes Crissman, Jeannette How, MD  tiotropium (SPIRIVA) 18 MCG inhalation capsule Place 1 capsule (18 mcg total) into inhaler and inhale daily. 10/11/16  Yes Crissman, Jeannette How, MD  warfarin (COUMADIN) 3 MG tablet TAKE 1 TABLET BY MOUTH ONCE DAILY 03/13/17  Yes Crissman, Jeannette How, MD  amoxicillin (AMOXIL) 500 MG capsule Take 500 mg by mouth as needed. 04/06/17   [provider]  ketoconazole (NIZORAL) 2 % shampoo Apply 1 application topically as needed for irritation.    [provider]  mupirocin ointment (BACTROBAN) 2 % Apply 1 application topically 2 (two) times daily. 06/05/17   Frederich Cha, MD  tamsulosin (FLOMAX) 0.4 MG CAPS capsule Take 1 capsule (0.4 mg total) by mouth daily. 10/11/16   Guadalupe Maple, MD    Family History Family History  Problem Relation Age of Onset  . Heart attack Mother   . Diabetes Maternal Grandmother   . Cancer Sister        lung    Social History Social History  Substance Use Topics  . Smoking status: Former Smoker    Types: Cigarettes    Quit date: 04/24/1991  . Smokeless tobacco: Never Used  . Alcohol use Yes     Comment: quit December 2015/restarted     Allergies   Prednisone   Review of Systems Review of Systems  Respiratory: Negative for shortness of breath.   Cardiovascular: Negative for chest pain.  Gastrointestinal: Negative for abdominal pain.  Musculoskeletal: Positive for joint swelling and myalgias.  Neurological: Negative for headaches.     Physical Exam Triage Vital Signs ED Triage Vitals  Enc Vitals Group     BP 06/05/17 0839 139/69     Pulse Rate 06/05/17 0839 87     Resp 06/05/17 0839 16     Temp 06/05/17 0839 98.5 F (36.9 C)     Temp Source 06/05/17 0839 Oral      SpO2 06/05/17 0839 98 %     Weight 06/05/17 0841 163 lb (73.9 kg)     Height 06/05/17 0841 5\' 3"  (1.6 m)     Head Circumference --      Peak Flow --      Pain Score --      Pain Loc --      Pain Edu? --      Excl. in [place]? --    No data found.   Updated Vital Signs BP 139/69 (BP Location: Right Arm)   Pulse 87   Temp 98.5 F (36.9 C) (Oral)   Resp 16   Ht 5\' 3"  (1.6 m)  Wt 163 lb (73.9 kg)   SpO2 98%   BMI 28.87 kg/m   Visual Acuity Right Eye Distance:   Left Eye Distance:   Bilateral Distance:    Right Eye Near:   Left Eye Near:    Bilateral Near:     Physical Exam  Constitutional: He appears well-developed. He is active.  Non-toxic appearance. He does not have a sickly appearance. He does not appear ill. No distress.  Eyes: Pupils are equal, round, and reactive to light.  Neck: Normal range of motion.  Pulmonary/Chest: Effort normal.  Musculoskeletal: Normal range of motion.       Left elbow: He exhibits laceration.       Left knee: He exhibits laceration and erythema.       Left forearm: He exhibits tenderness and laceration.       Arms: He had an abrasion over the left knee left forearm left elbow left elbow forearm is still losing blood in the knee is not actively bleeding anymore  Neurological: He is alert.  Skin: There is erythema.  Psychiatric: He has a normal mood and affect.  Vitals reviewed.    UC Treatments / Results  Labs (all labs ordered are listed, but only abnormal results are displayed) Labs Reviewed - No data to display  EKG  EKG Interpretation None       Radiology Dg Elbow Complete Left  Result Date: 06/05/2017 CLINICAL DATA:  Patient fell last night landing on concrete.  Pain. EXAM: LEFT ELBOW - COMPLETE 3+ VIEW COMPARISON:  None. FINDINGS: No acute fracture is identified. No subluxation or dislocation. Lateral film shows elevation of the anterior fat pad consistent with joint effusion. IMPRESSION: Joint effusion without acute  fracture evident. Occult nondisplaced fracture is a consideration. Follow-up MRI may prove helpful to further evaluate. Electronically Signed   By: Misty Stanley M.D.   On: 06/05/2017 09:53   Dg Forearm Left  Result Date: 06/05/2017 CLINICAL DATA:  Fall.  Pain. EXAM: LEFT FOREARM - 2 VIEW COMPARISON:  None. FINDINGS: Bones are diffusely demineralized. No evidence for fracture in the radius or ulna. Advanced degenerative changes are noted in the wrist. IMPRESSION: Degenerative changes in the wrist without acute bony abnormality. Electronically Signed   By: Misty Stanley M.D.   On: 06/05/2017 09:53   Dg Knee Complete 4 Views Left  Result Date: 06/05/2017 CLINICAL DATA:  Patient status post fall.  Initial encounter. EXAM: LEFT KNEE - COMPLETE 4+ VIEW COMPARISON:  None. FINDINGS: Lateral compartment joint space narrowing with subchondral sclerosis and osteophytosis. Normal anatomic alignment. No evidence for acute fracture or dislocation. Medial and patellofemoral compartment osteophytosis. No joint effusion. IMPRESSION: No acute osseous abnormality. Tricompartmental degenerative changes most pronounced within the lateral compartment. Electronically Signed   By: Lovey Newcomer M.D.   On: 06/05/2017 09:55    Procedures Procedures (including critical care time)  Medications Ordered in UC Medications - No data to display   Initial Impression / Assessment and Plan / UC Course  I have reviewed the triage vital signs and the nursing notes.  Pertinent labs & imaging results that were available during my care of the patient were reviewed by me and considered in my medical decision making (see chart for details).    Patient with multiple contusions multiple abrasions compression dressings were placed which stopped the bleeding. Bactroban ointment to apply after wearing the compression dressings for 24-36 hours. I'm not going to recommend he stop his Coumadin I recommend he follow-up with  his PCP early next  week. Also the left elbow shows a fat pad present so I explained to him that he could have an occult elbow fracture and radiologist wanted or concerns recommended possible MRI but as he reminded me he has a valve he cannot have MRIs done so he may need to have a CT scan or repeat x-ray next week if his elbow still bothering him. We'll place him in a sling for protection   Final Clinical Impressions(s) / UC Diagnoses   Final diagnoses:  Fall, initial encounter  Contusion, multiple sites  Abrasions of multiple sites  Abnormal x-ray of extremity    New Prescriptions New Prescriptions   MUPIROCIN OINTMENT (BACTROBAN) 2 %    Apply 1 application topically 2 (two) times daily.   Note: This dictation was prepared with Dragon dictation along with smaller phrase technology. Any transcriptional errors that result from this process are unintentional.   Frederich Cha, MD 06/05/17 1012    Frederich Cha, MD 06/05/17 1013

## 2017-06-05 NOTE — ED Provider Notes (Signed)
[facility] Emergency Department Provider Note  ____________________________________________  Time seen: Approximately 7:03 PM  I have reviewed the triage vital signs and the nursing notes.   HISTORY  Chief Complaint Laceration    HPI Kirk Jones is a 76 y.o. male who presents to emergency department complaining of bleeding to his left forearm. Patient sustained a fall yesterday including significant skin tear to the left forearm/elbow region. Patient is on warfarin and has bleeding through through his bandages. Patient was evaluated at [facility] urgent care this morning where he was evaluated with x-rays and wound care was performed. Patient had bleeding through his bandage to his left arm and presents to the emergency department concerned for continual bleeding. Patient is on warfarin but states that his INR is within therapeutic range with last result being 3.1. She did not hit his head or lose consciousness. X-ray of elbow at urgent care reveals positive fat pad sign consistent with possible elbow fracture. Patient denies any musculoskeletal pain at this time and denies any numbness or tingling of the left hand. Patient's only concern at this time is continual bleeding to the left forearm.  Patient sustained abrasions to bilateral upper extremities and bilateral lower extremities. These were cleaned and dressed at urgent care prior to arrival. No complaints with these abrasions. Patient was not placed on antibiotics prophylactically. He was advised to follow-up with primary care.  The patient does have a history significant for aortic valve regurgitation, A. fib, COPD, hypertension.   Past Medical History:  Diagnosis Date  . Aortic valve regurgitation   . Atrial fibrillation ([place])   . Barrett's esophagus   . COPD (chronic obstructive pulmonary disease) ([place])   . Diverticulosis   . Gout   . Hematuria   . History of prosthetic heart valve   .  Hyperlipidemia   . Hypertension   . Nodular prostate with urinary obstruction   . Substance abuse    alcohol    Patient Active Problem List   Diagnosis Date Noted  . CAD (coronary artery disease) 10/06/2015  . Pacemaker 10/06/2015  . Automatic implantable cardioverter-defibrillator in situ 10/06/2015  . Nodular prostate with urinary obstruction 10/06/2015  . Senile purpura ([place]) 10/06/2015  . Barrett's esophagus 05/28/2015  . Hypertension 05/28/2015  . COPD (chronic obstructive pulmonary disease) ([place]) 05/28/2015  . Hyperlipidemia 05/28/2015  . Diverticulosis 05/28/2015  . CKD (chronic kidney disease), stage III 05/28/2015  . Gout 05/28/2015  . History of prosthetic heart valve 05/13/2015  . Atrial fibrillation ([place]) 05/13/2015    Past Surgical History:  Procedure Laterality Date  . CARPAL TUNNEL RELEASE Right 2015  . CATARACT EXTRACTION Right 2017  . VASECTOMY      Prior to Admission medications   Medication Sig Start Date End Date Taking? Authorizing Provider  acetaminophen (TYLENOL) 500 MG tablet Take 500 mg by mouth 3 (three) times daily.    [provider]  amoxicillin (AMOXIL) 500 MG capsule Take 500 mg by mouth as needed. 04/06/17   [provider]  aspirin EC 81 MG tablet Take 81 mg by mouth daily.    [provider]  cephALEXin (KEFLEX) 500 MG capsule Take 1 capsule (500 mg total) by mouth 3 (three) times daily. 06/05/17   Cuthriell, Charline Bills, PA-C  diltiazem (DILACOR XR) 180 MG 24 hr capsule TAKE 1 CAPSULE BY MOUTH ONCE DAILY 11/25/16   Wynetta Emery, Megan P, DO  febuxostat (ULORIC) 40 MG tablet Take 1 tablet (40 mg total) by mouth  daily. 04/06/17   Volney American, PA-C  ferrous sulfate 325 (65 FE) MG tablet Take 325 mg by mouth once a week. Reported on 04/05/2016    [provider]  fexofenadine (ALLEGRA) 180 MG tablet Take 180 mg by mouth daily.    [provider]  fluticasone (FLONASE) 50 MCG/ACT nasal spray Place 2  sprays into both nostrils daily. 10/11/16   Guadalupe Maple, MD  furosemide (LASIX) 40 MG tablet Take 40 mg by mouth daily.     [provider]  ketoconazole (NIZORAL) 2 % shampoo Apply 1 application topically as needed for irritation.    [provider]  losartan (COZAAR) 25 MG tablet Take 1 tablet (25 mg total) by mouth daily. 10/11/16   Guadalupe Maple, MD  magnesium 30 MG tablet Take 30 mg by mouth daily.    [provider]  metoprolol succinate (TOPROL-XL) 50 MG 24 hr tablet Take 1 tablet (50 mg total) by mouth 2 (two) times daily. Take with or immediately following a meal. 10/11/16   Crissman, Jeannette How, MD  Multiple Vitamins-Minerals (CENTRUM ADULTS PO) Take by mouth daily.    [provider]  mupirocin ointment (BACTROBAN) 2 % Apply 1 application topically 2 (two) times daily. 06/05/17   Frederich Cha, MD  omeprazole (PRILOSEC) 20 MG capsule Take 1 capsule (20 mg total) by mouth 2 (two) times daily before a meal. 10/11/16   Crissman, Jeannette How, MD  tamsulosin (FLOMAX) 0.4 MG CAPS capsule Take 1 capsule (0.4 mg total) by mouth daily. 10/11/16   Guadalupe Maple, MD  tiotropium (SPIRIVA) 18 MCG inhalation capsule Place 1 capsule (18 mcg total) into inhaler and inhale daily. 10/11/16   Guadalupe Maple, MD  warfarin (COUMADIN) 3 MG tablet TAKE 1 TABLET BY MOUTH ONCE DAILY 03/13/17   Guadalupe Maple, MD    Allergies Prednisone  Family History  Problem Relation Age of Onset  . Heart attack Mother   . Diabetes Maternal Grandmother   . Cancer Sister        lung    Social History Social History  Substance Use Topics  . Smoking status: Former Smoker    Types: Cigarettes    Quit date: 04/24/1991  . Smokeless tobacco: Never Used  . Alcohol use Yes     Comment: quit December 2015/restarted     Review of Systems  Constitutional: No fever/chills Eyes: No visual changes.  Cardiovascular: no chest pain. Respiratory: no cough. No SOB. Gastrointestinal: No  abdominal pain.  No nausea, no vomiting.  Musculoskeletal: Negative for musculoskeletal pain. Skin: Positive for abrasion/skin tear. Lacerations to bilateral knees, right forearm, left forearm. Neurological: Negative for headaches, focal weakness or numbness. 10-point ROS otherwise negative.  ____________________________________________   PHYSICAL EXAM:  VITAL SIGNS: ED Triage Vitals [06/05/17 1713]  Enc Vitals Group     BP (!) 159/91     Pulse Rate 92     Resp 16     Temp 98.9 F (37.2 C)     Temp Source Oral     SpO2 96 %     Weight 155 lb (70.3 kg)     Height 5\' 3"  (1.6 m)     Head Circumference      Peak Flow      Pain Score      Pain Loc      Pain Edu?      Excl. in [place]?      Constitutional: Alert and oriented. Well  appearing and in no acute distress. Eyes: Conjunctivae are normal. PERRL. EOMI. Head: Atraumatic. Neck: No stridor.  No cervical spine tenderness to palpation  Cardiovascular: Normal rate, regular rhythm. Normal S1 and S2.  Good peripheral circulation. Respiratory: Normal respiratory effort without tachypnea or retractions. Lungs CTAB. Good air entry to the bases with no decreased or absent breath sounds. Musculoskeletal: Full range of motion to all extremities. No gross deformities appreciated. Neurologic:  Normal speech and language. No gross focal neurologic deficits are appreciated.  Skin:  Skin is warm, dry and intact. No rash noted. Patient has bandages to bilateral knees and right forearm. These are not removed to visualize underlying wounds. Patient does have bleeding through of bandage to the left forearm. This is removed to reveal significant skin tear. Patient has skin tear measuring approximately 12 cm x 5 cm. Wound is oozing blood at this time but no frank bleeding. Skin tear involves damage to the dermal tissue with no overlying epithelial tissue at this time. No surrounding erythema or edema consistent with infection. Psychiatric: Mood and affect  are normal. Speech and behavior are normal. Patient exhibits appropriate insight and judgement.   ____________________________________________   LABS (all labs ordered are listed, but only abnormal results are displayed)  Labs Reviewed - No data to display ____________________________________________  EKG   ____________________________________________  RADIOLOGY Diamantina Providence Cuthriell, personally viewed and evaluated these images from urgent care earlier in the day  as part of my medical decision making, as well as reviewing the written report by the radiologist.  Dg Elbow Complete Left  Result Date: 06/05/2017 CLINICAL DATA:  Patient fell last night landing on concrete.  Pain. EXAM: LEFT ELBOW - COMPLETE 3+ VIEW COMPARISON:  None. FINDINGS: No acute fracture is identified. No subluxation or dislocation. Lateral film shows elevation of the anterior fat pad consistent with joint effusion. IMPRESSION: Joint effusion without acute fracture evident. Occult nondisplaced fracture is a consideration. Follow-up MRI may prove helpful to further evaluate. Electronically Signed   By: Misty Stanley M.D.   On: 06/05/2017 09:53   Dg Forearm Left  Result Date: 06/05/2017 CLINICAL DATA:  Fall.  Pain. EXAM: LEFT FOREARM - 2 VIEW COMPARISON:  None. FINDINGS: Bones are diffusely demineralized. No evidence for fracture in the radius or ulna. Advanced degenerative changes are noted in the wrist. IMPRESSION: Degenerative changes in the wrist without acute bony abnormality. Electronically Signed   By: Misty Stanley M.D.   On: 06/05/2017 09:53   Dg Knee Complete 4 Views Left  Result Date: 06/05/2017 CLINICAL DATA:  Patient status post fall.  Initial encounter. EXAM: LEFT KNEE - COMPLETE 4+ VIEW COMPARISON:  None. FINDINGS: Lateral compartment joint space narrowing with subchondral sclerosis and osteophytosis. Normal anatomic alignment. No evidence for acute fracture or dislocation. Medial and patellofemoral  compartment osteophytosis. No joint effusion. IMPRESSION: No acute osseous abnormality. Tricompartmental degenerative changes most pronounced within the lateral compartment. Electronically Signed   By: Lovey Newcomer M.D.   On: 06/05/2017 09:55    ____________________________________________    PROCEDURES  Procedure(s) performed:    Marland KitchenMarland KitchenLaceration Repair Date/Time: 06/05/2017 7:57 PM Performed by: Betha Loa D Authorized by: Betha Loa D   Consent:    Consent obtained:  Verbal   Consent given by:  Patient   Risks discussed:  Infection, pain and poor wound healing   Alternatives discussed:  Referral Anesthesia (see MAR for exact dosages):    Anesthesia method:  None Laceration details:    Location:  Shoulder/arm  Shoulder/arm location:  L lower arm   Length (cm):  12 Repair type:    Repair type:  Intermediate Exploration:    Hemostasis achieved with:  Direct pressure (Surgicel hemostatic dressing)   Wound extent: no foreign bodies/material noted, no muscle damage noted, no tendon damage noted and no underlying fracture noted     Contaminated: no   Treatment:    Area cleansed with:  Shur-Clens   Amount of cleaning:  Standard   Irrigation solution:  Sterile saline Post-procedure details:    Dressing:  Non-adherent dressing and bulky dressing (Surgicel hemostatic dressing)   Patient tolerance of procedure:  Tolerated well, no immediate complications Comments:     Patient had significant skin tear to the left forearm. This measured approximately 12 cm x 5 cm. Area was still bleeding. This time, the area was again cleansed, dressed using Surgicel nonadhering dressing with bulky dressing applied over top. This is secured using Ace bandage. Patient is advised to leave dressing in place until he is seen by primary care for wound care.      Medications  cephALEXin (KEFLEX) capsule 250 mg (250 mg Oral Given 06/05/17 1928)      ____________________________________________   INITIAL IMPRESSION / ASSESSMENT AND PLAN / ED COURSE  Pertinent labs & imaging results that were available during my care of the patient were reviewed by me and considered in my medical decision making (see chart for details).  Review of the [place] CSRS was performed in accordance of the [place] prior to dispensing any controlled drugs.     Patient's diagnosis is consistent with skin tear status post a fall to the left forearm. Patient was evaluated earlier today at urgent care and had his wounds dressed and cleansed. Patient also had x-rays which returned with possible left elbow fracture. Patient was given a sling and advised to follow-up for possible elbow fracture. Patient did not hit his head or lose consciousness. His only complaint at this time was bleeding through from his left forearm/elbow skin tear. This was removed to the emergency Department, cleansed, dressed using Surgicel dressing, nonadhesive dressing, bulky dressing as described above. Patient tolerated this well. Wound care instructions are given to patient. At this time, due to the significant skin tear as well as multiple other abrasions, patient will be treated prophylactically with antibiotics. Patient is to have close follow-up with primary care or follow up with wound care to ensure proper healing of significant skin tear.. Patient will be discharged home with prescriptions for Keflex for prophylaxis. Patient is to follow up with primary care or wound care as needed or otherwise directed. Patient is given ED precautions to return to the ED for any worsening or new symptoms.     ____________________________________________  FINAL CLINICAL IMPRESSION(S) / ED DIAGNOSES  Final diagnoses:  Skin tear of left elbow without complication, initial encounter  Fall, initial encounter      NEW MEDICATIONS STARTED DURING THIS VISIT:  Discharge Medication List as of 06/05/2017   7:08 PM    START taking these medications   Details  cephALEXin (KEFLEX) 500 MG capsule Take 1 capsule (500 mg total) by mouth 3 (three) times daily., Starting Sun 06/05/2017, Print            This chart was dictated using voice recognition software/Dragon. Despite best efforts to proofread, errors can occur which can change the meaning. Any change was purely unintentional.    Darletta Moll, PA-C 06/05/17 2002    Darel Hong, MD  06/05/17 2200  

## 2017-06-05 NOTE — ED Notes (Signed)

## 2017-06-05 NOTE — ED Triage Notes (Signed)
Pt tripped and fell last night. Large skin tear/avulsion to left forearm, large abrasion to left knee, small skin tear to right forearm. Denies other injuries.

## 2017-06-06 ENCOUNTER — Ambulatory Visit (INDEPENDENT_AMBULATORY_CARE_PROVIDER_SITE_OTHER): Payer: Medicare Other | Admitting: Family Medicine

## 2017-06-06 ENCOUNTER — Encounter: Payer: Self-pay | Admitting: Family Medicine

## 2017-06-06 VITALS — BP 119/71 | HR 80 | Temp 98.9°F | Wt 155.0 lb

## 2017-06-06 DIAGNOSIS — Z952 Presence of prosthetic heart valve: Secondary | ICD-10-CM | POA: Diagnosis not present

## 2017-06-06 DIAGNOSIS — T148XXA Other injury of unspecified body region, initial encounter: Secondary | ICD-10-CM | POA: Diagnosis not present

## 2017-06-06 DIAGNOSIS — I4891 Unspecified atrial fibrillation: Secondary | ICD-10-CM | POA: Diagnosis not present

## 2017-06-06 MED ORDER — COLCHICINE 0.6 MG PO TABS: 0.6000 mg | ORAL_TABLET | Freq: Every day | ORAL | 1 refills | Status: DC

## 2017-06-06 NOTE — Progress Notes (Signed)
BP 119/71   Pulse 80   Temp 98.9 F (37.2 C)   Wt 155 lb (70.3 kg)   SpO2 98%   BMI 27.46 kg/m    Subjective:    [name], male    DOB: 1941-10-05, 76 y.o.   MRN: [id number]  HPI: Kirk Jones is a 76 y.o. male  Chief Complaint  Patient presents with  . UC/ED Follow Up    s/p fall the night of July 7th. Multiple cuts/bruises/scrapes. Possible left elbow fx seen on xray, but he states he isn't having any pain.  . Anticoagulation    taking 3.5mg  every day, skipped last nights dose due to bleeding from injuries.   Patient presents for ER f/u after sustaining several superficial abrasions from a fall that occurred 06/04/17. Wounds were irrigated and dressed in ER, keflex initiated. Pt states pain is under good control, and denies any fevers, chills, worsening redness or pain at sites. Worst area is left forearm, other abrasions on right forearm and left lateral knee.   Pt is chronically anticoagulated and currently taking 3.5 mg coumadin daily. Skipped last night's dose d/t significant bleeding from wounds.   Past Medical History:  Diagnosis Date  . Aortic valve regurgitation   . Atrial fibrillation ([place])   . Barrett's esophagus   . COPD (chronic obstructive pulmonary disease) ([place])   . Diverticulosis   . Gout   . Hematuria   . History of prosthetic heart valve   . Hyperlipidemia   . Hypertension   . Nodular prostate with urinary obstruction   . Substance abuse    alcohol   Social History   Social History  . Marital status: Married    Spouse name: N/A  . Number of children: N/A  . Years of education: N/A   Occupational History  . Not on file.   Social History Main Topics  . Smoking status: Former Smoker    Types: Cigarettes    Quit date: 04/24/1991  . Smokeless tobacco: Never Used  . Alcohol use Yes     Comment: quit December 2015/restarted  . Drug use: No  . Sexual activity: No   Other Topics Concern  . Not on file   Social  History Narrative  . No narrative on file   Relevant past medical, surgical, family and social history reviewed and updated as indicated. Interim medical history since our last visit reviewed. Allergies and medications reviewed and updated.  Review of Systems  Constitutional: Negative.   HENT: Negative.   Eyes: Negative.   Respiratory: Negative.   Cardiovascular: Negative.   Gastrointestinal: Negative.   Genitourinary: Negative.   Musculoskeletal: Positive for myalgias (related to fall).  Skin: Positive for wound.  Neurological: Negative.   Hematological: Bruises/bleeds easily.  Psychiatric/Behavioral: Negative.     Per HPI unless specifically indicated above     Objective:    BP 119/71   Pulse 80   Temp 98.9 F (37.2 C)   Wt 155 lb (70.3 kg)   SpO2 98%   BMI 27.46 kg/m   Wt Readings from Last 3 Encounters:  06/06/17 155 lb (70.3 kg)  06/05/17 155 lb (70.3 kg)  06/05/17 163 lb (73.9 kg)    Physical Exam  Constitutional: He is oriented to person, place, and time. He appears well-developed and well-nourished. No distress.  HENT:  Head: Atraumatic.  Eyes: Conjunctivae are normal. Pupils are equal, round, and reactive to light.  Neck: Normal range of motion. Neck  supple.  Cardiovascular: Normal rate and intact distal pulses.   Pulmonary/Chest: Effort normal. No respiratory distress.  Musculoskeletal: Normal range of motion. He exhibits edema (left hand and wrist edema below largest wound).  Neurological: He is alert and oriented to person, place, and time. No cranial nerve deficit. He exhibits normal muscle tone.  Skin: Skin is warm and dry.  Small 1 cm superficial skin tear of right forearm 3 cm superficial abrasion of lateral left knee 8-9 cm abrasion to left forearm Bleeding under fairly good control with each, wounds clean and without evidence of infection  Psychiatric: He has a normal mood and affect. His behavior is normal.  Nursing note and vitals  reviewed.  Results for orders placed or performed in visit on 06/06/17  CoaguChek XS/INR Waived  Result Value Ref Range   INR 2.5 (H) 0.9 - 1.1   Prothrombin Time 30.3 sec      Assessment & Plan:   Problem List Items Addressed This Visit      Cardiovascular and Mediastinum   Atrial fibrillation (HCC) (Chronic)    INR 2.5 today, hold one more evening of coumadin then restart 3.5 mg daily. F/u as scheduled for INR recheck unless problems in the meantime.       Relevant Orders   CoaguChek XS/INR Waived (Completed)     Other   History of prosthetic heart valve (Chronic)   Relevant Orders   CoaguChek XS/INR Waived (Completed)    Other Visit Diagnoses    Skin abrasion    -  Primary   Multiple sites. Wound dressings, antibiotic ointment applied. Wound care discussed. Elevate left hand to reduce swelling. Complete keflex. F/u in 3 days       Follow up plan: Return in about 3 days (around 06/09/2017) for Wound check.

## 2017-06-07 LAB — COAGUCHEK XS/INR WAIVED
INR: 2.5 — AB (ref 0.9–1.1)
PROTHROMBIN TIME: 30.3 s

## 2017-06-08 NOTE — Assessment & Plan Note (Signed)
INR 2.5 today, hold one more evening of coumadin then restart 3.5 mg daily. F/u as scheduled for INR recheck unless problems in the meantime.

## 2017-06-09 ENCOUNTER — Encounter: Payer: Self-pay | Admitting: Family Medicine

## 2017-06-09 ENCOUNTER — Ambulatory Visit (INDEPENDENT_AMBULATORY_CARE_PROVIDER_SITE_OTHER): Payer: Medicare Other | Admitting: Family Medicine

## 2017-06-09 VITALS — BP 148/65 | HR 93 | Temp 98.3°F | Wt 155.0 lb

## 2017-06-09 DIAGNOSIS — T148XXA Other injury of unspecified body region, initial encounter: Secondary | ICD-10-CM | POA: Diagnosis not present

## 2017-06-10 MED ORDER — CEPHALEXIN 500 MG PO CAPS: 500.0000 mg | ORAL_CAPSULE | Freq: Three times a day (TID) | ORAL | 0 refills | Status: DC

## 2017-06-10 NOTE — Patient Instructions (Signed)
Continue wound care as discussed

## 2017-06-10 NOTE — Progress Notes (Signed)
   BP (!) 148/65   Pulse 93   Temp 98.3 F (36.8 C)   Wt 155 lb (70.3 kg)   SpO2 96%   BMI 27.46 kg/m    Subjective:    [name], male    DOB: 1941-11-02, 76 y.o.   MRN: [id number]  HPI: Kirk Jones is a 76 y.o. male  Chief Complaint  Patient presents with  . Wound Check    he states the wounds are doing better.    Patient presents today for wound check. Doing well on the keflex, no side effects reported. Wounds healing fairly well. Denies fever, chills, N/V, persistent bleeding. Back on regular coumadin dose with no held doses. Pain under good control without any medications.   Relevant past medical, surgical, family and social history reviewed and updated as indicated. Interim medical history since our last visit reviewed. Allergies and medications reviewed and updated.  Review of Systems  Constitutional: Negative.   Respiratory: Negative.   Cardiovascular: Negative.   Gastrointestinal: Negative.   Musculoskeletal: Negative.   Hematological: Negative.   Psychiatric/Behavioral: Negative.    Per HPI unless specifically indicated above     Objective:    BP (!) 148/65   Pulse 93   Temp 98.3 F (36.8 C)   Wt 155 lb (70.3 kg)   SpO2 96%   BMI 27.46 kg/m   Wt Readings from Last 3 Encounters:  06/09/17 155 lb (70.3 kg)  06/06/17 155 lb (70.3 kg)  06/05/17 155 lb (70.3 kg)    Physical Exam  Constitutional: He is oriented to person, place, and time. He appears well-developed and well-nourished. No distress.  HENT:  Head: Atraumatic.  Eyes: Pupils are equal, round, and reactive to light. Conjunctivae are normal.  Neck: Normal range of motion. Neck supple.  Cardiovascular: Normal rate and normal heart sounds.   Pulmonary/Chest: Effort normal and breath sounds normal. No respiratory distress.  Musculoskeletal: Normal range of motion. He exhibits no edema (Left hand edema resolved).  Neurological: He is alert and oriented to person,  place, and time.  Skin: Skin is warm and dry.  Wounds in all 3 areas healing well without complication. Largest wound, left forearm, with fair amount of granulation tissue but no purulence or erythema  Psychiatric: He has a normal mood and affect. His behavior is normal.  Nursing note and vitals reviewed.  Results for orders placed or performed in visit on 06/06/17  CoaguChek XS/INR Waived  Result Value Ref Range   INR 2.5 (H) 0.9 - 1.1   Prothrombin Time 30.3 sec      Assessment & Plan:   Problem List Items Addressed This Visit    None    Visit Diagnoses    Skin abrasion    -  Primary   Wounds irrigated with saline and re-dressed. Continue wound care as before, another week of keflex called in. F/u in 3 days for wound check.        Follow up plan: Return in about 3 days (around 06/12/2017) for Wound check.

## 2017-06-13 ENCOUNTER — Ambulatory Visit (INDEPENDENT_AMBULATORY_CARE_PROVIDER_SITE_OTHER): Payer: Medicare Other | Admitting: Family Medicine

## 2017-06-13 ENCOUNTER — Encounter: Payer: Self-pay | Admitting: Family Medicine

## 2017-06-13 ENCOUNTER — Other Ambulatory Visit: Payer: Self-pay

## 2017-06-13 ENCOUNTER — Observation Stay
Admission: EM | Admit: 2017-06-13 | Discharge: 2017-06-15 | Disposition: A | Discharge status: Home / Self Care | Payer: Medicare Other | Attending: Internal Medicine | Admitting: Internal Medicine

## 2017-06-13 ENCOUNTER — Emergency Department: Payer: Medicare Other

## 2017-06-13 VITALS — BP 140/81 | HR 109 | Temp 98.3°F | Wt 155.0 lb

## 2017-06-13 DIAGNOSIS — E785 Hyperlipidemia, unspecified: Secondary | ICD-10-CM | POA: Diagnosis not present

## 2017-06-13 DIAGNOSIS — R4701 Aphasia: Principal | ICD-10-CM | POA: Diagnosis present

## 2017-06-13 DIAGNOSIS — J449 Chronic obstructive pulmonary disease, unspecified: Secondary | ICD-10-CM | POA: Diagnosis not present

## 2017-06-13 DIAGNOSIS — R29818 Other symptoms and signs involving the nervous system: Secondary | ICD-10-CM | POA: Diagnosis not present

## 2017-06-13 DIAGNOSIS — I2510 Atherosclerotic heart disease of native coronary artery without angina pectoris: Secondary | ICD-10-CM | POA: Diagnosis present

## 2017-06-13 DIAGNOSIS — I4891 Unspecified atrial fibrillation: Secondary | ICD-10-CM | POA: Diagnosis not present

## 2017-06-13 DIAGNOSIS — Z7901 Long term (current) use of anticoagulants: Secondary | ICD-10-CM | POA: Insufficient documentation

## 2017-06-13 DIAGNOSIS — Z79899 Other long term (current) drug therapy: Secondary | ICD-10-CM | POA: Diagnosis not present

## 2017-06-13 DIAGNOSIS — Z87891 Personal history of nicotine dependence: Secondary | ICD-10-CM | POA: Insufficient documentation

## 2017-06-13 DIAGNOSIS — Z952 Presence of prosthetic heart valve: Secondary | ICD-10-CM | POA: Insufficient documentation

## 2017-06-13 DIAGNOSIS — R29702 NIHSS score 2: Secondary | ICD-10-CM | POA: Diagnosis not present

## 2017-06-13 DIAGNOSIS — Z950 Presence of cardiac pacemaker: Secondary | ICD-10-CM | POA: Insufficient documentation

## 2017-06-13 DIAGNOSIS — I639 Cerebral infarction, unspecified: Secondary | ICD-10-CM

## 2017-06-13 DIAGNOSIS — I129 Hypertensive chronic kidney disease with stage 1 through stage 4 chronic kidney disease, or unspecified chronic kidney disease: Secondary | ICD-10-CM | POA: Insufficient documentation

## 2017-06-13 DIAGNOSIS — M109 Gout, unspecified: Secondary | ICD-10-CM | POA: Insufficient documentation

## 2017-06-13 DIAGNOSIS — I4510 Unspecified right bundle-branch block: Secondary | ICD-10-CM | POA: Insufficient documentation

## 2017-06-13 DIAGNOSIS — T148XXA Other injury of unspecified body region, initial encounter: Secondary | ICD-10-CM | POA: Diagnosis not present

## 2017-06-13 DIAGNOSIS — I10 Essential (primary) hypertension: Secondary | ICD-10-CM | POA: Diagnosis not present

## 2017-06-13 DIAGNOSIS — I480 Paroxysmal atrial fibrillation: Secondary | ICD-10-CM | POA: Insufficient documentation

## 2017-06-13 DIAGNOSIS — Z7951 Long term (current) use of inhaled steroids: Secondary | ICD-10-CM | POA: Insufficient documentation

## 2017-06-13 DIAGNOSIS — N183 Chronic kidney disease, stage 3 unspecified: Secondary | ICD-10-CM | POA: Diagnosis present

## 2017-06-13 LAB — COMPREHENSIVE METABOLIC PANEL
ALK PHOS: 62 U/L (ref 38–126)
ALT: 15 U/L — AB (ref 17–63)
ANION GAP: 11 (ref 5–15)
AST: 37 U/L (ref 15–41)
Albumin: 4.2 g/dL (ref 3.5–5.0)
BILIRUBIN TOTAL: 1.1 mg/dL (ref 0.3–1.2)
BUN: 20 mg/dL (ref 6–20)
CALCIUM: 9.6 mg/dL (ref 8.9–10.3)
CO2: 27 mmol/L (ref 22–32)
CREATININE: 1.23 mg/dL (ref 0.61–1.24)
Chloride: 99 mmol/L — ABNORMAL LOW (ref 101–111)
GFR calc non Af Amer: 55 mL/min — ABNORMAL LOW (ref >60)
GLUCOSE: 128 mg/dL — AB (ref 65–99)
Potassium: 4.1 mmol/L (ref 3.5–5.1)
Sodium: 137 mmol/L (ref 135–145)
TOTAL PROTEIN: 7.6 g/dL (ref 6.5–8.1)

## 2017-06-13 LAB — PROTIME-INR
INR: 1.42
Prothrombin Time: 17.5 seconds — ABNORMAL HIGH (ref 11.4–15.2)

## 2017-06-13 LAB — CBC
HEMATOCRIT: 40.7 % (ref 40.0–52.0)
HEMOGLOBIN: 14.1 g/dL (ref 13.0–18.0)
MCH: 35.5 pg — ABNORMAL HIGH (ref 26.0–34.0)
MCHC: 34.7 g/dL (ref 32.0–36.0)
MCV: 102.3 fL — ABNORMAL HIGH (ref 80.0–100.0)
Platelets: 247 10*3/uL (ref 150–440)
RBC: 3.98 MIL/uL — ABNORMAL LOW (ref 4.40–5.90)
RDW: 14.7 % — ABNORMAL HIGH (ref 11.5–14.5)
WBC: 6.4 10*3/uL (ref 3.8–10.6)

## 2017-06-13 LAB — DIFFERENTIAL
Basophils Absolute: 0.1 10*3/uL (ref 0–0.1)
Basophils Relative: 1 %
EOS PCT: 1 %
Eosinophils Absolute: 0.1 10*3/uL (ref 0–0.7)
LYMPHS ABS: 0.8 10*3/uL — AB (ref 1.0–3.6)
LYMPHS PCT: 12 %
MONO ABS: 1.1 10*3/uL — AB (ref 0.2–1.0)
Monocytes Relative: 17 %
NEUTROS ABS: 4.4 10*3/uL (ref 1.4–6.5)
Neutrophils Relative %: 69 %

## 2017-06-13 LAB — APTT: APTT: 29 s (ref 24–36)

## 2017-06-13 LAB — TROPONIN I: Troponin I: 0.03 ng/mL (ref <0.03)

## 2017-06-13 MED ORDER — WARFARIN SODIUM 4 MG PO TABS
4.0000 mg | ORAL_TABLET | Freq: Every day | ORAL | Status: DC
  Administered 2017-06-14: 4 mg via ORAL
  Filled 2017-06-13 (×2): qty 1

## 2017-06-13 MED ORDER — ACETAMINOPHEN 325 MG PO TABS: 650.0000 mg | ORAL_TABLET | ORAL | Status: DC | PRN

## 2017-06-13 MED ORDER — ASPIRIN 81 MG PO CHEW
324.0000 mg | CHEWABLE_TABLET | Freq: Once | ORAL | Status: AC
  Administered 2017-06-13: 324 mg via ORAL
  Filled 2017-06-13: qty 4

## 2017-06-13 MED ORDER — ACETAMINOPHEN 160 MG/5ML PO SOLN
650.0000 mg | ORAL | Status: DC | PRN
  Filled 2017-06-13: qty 20.3

## 2017-06-13 MED ORDER — WARFARIN - PHARMACIST DOSING INPATIENT: Freq: Every day | Status: DC

## 2017-06-13 MED ORDER — TIOTROPIUM BROMIDE MONOHYDRATE 18 MCG IN CAPS
18.0000 ug | ORAL_CAPSULE | Freq: Every day | RESPIRATORY_TRACT | Status: DC
  Administered 2017-06-14 – 2017-06-15 (×2): 18 ug via RESPIRATORY_TRACT
  Filled 2017-06-13: qty 5

## 2017-06-13 MED ORDER — HEPARIN BOLUS VIA INFUSION
4000.0000 [IU] | Freq: Once | INTRAVENOUS | Status: AC
  Administered 2017-06-14: 4000 [IU] via INTRAVENOUS
  Filled 2017-06-13: qty 4000

## 2017-06-13 MED ORDER — HEPARIN (PORCINE) IN NACL 100-0.45 UNIT/ML-% IJ SOLN
950.0000 [IU]/h | INTRAMUSCULAR | Status: DC
  Administered 2017-06-14: 800 [IU]/h via INTRAVENOUS
  Filled 2017-06-13: qty 250

## 2017-06-13 MED ORDER — PANTOPRAZOLE SODIUM 40 MG PO TBEC
40.0000 mg | DELAYED_RELEASE_TABLET | Freq: Every day | ORAL | Status: DC
  Administered 2017-06-14 – 2017-06-15 (×2): 40 mg via ORAL
  Filled 2017-06-13 (×2): qty 1

## 2017-06-13 MED ORDER — STROKE: EARLY STAGES OF RECOVERY BOOK
Freq: Once | Status: AC
  Administered 2017-06-13

## 2017-06-13 MED ORDER — FEBUXOSTAT 40 MG PO TABS
40.0000 mg | ORAL_TABLET | Freq: Every day | ORAL | Status: DC
  Administered 2017-06-14 – 2017-06-15 (×2): 40 mg via ORAL
  Filled 2017-06-13 (×2): qty 1

## 2017-06-13 MED ORDER — ASPIRIN EC 81 MG PO TBEC
81.0000 mg | DELAYED_RELEASE_TABLET | Freq: Every day | ORAL | Status: DC
  Administered 2017-06-14 – 2017-06-15 (×2): 81 mg via ORAL
  Filled 2017-06-13 (×2): qty 1

## 2017-06-13 MED ORDER — ACETAMINOPHEN 650 MG RE SUPP: 650.0000 mg | RECTAL | Status: DC | PRN

## 2017-06-13 NOTE — H&P (Signed)
[facility]    MR#:  [id number]  DATE OF BIRTH:  May 13, 1941  DATE OF ADMISSION:  06/13/2017  PRIMARY CARE PHYSICIAN: Kirk Maple, MD   REQUESTING/REFERRING PHYSICIAN: Joni Fears, MD  CHIEF COMPLAINT:   Chief Complaint  Patient presents with  . Aphasia  . Dizziness    HISTORY OF PRESENT ILLNESS:  Kirk Jones  is a 76 y.o. male who presents with Expressive aphasia. Patient states that he first noticed the symptoms about 10:00 in the morning, when he was talking to a gentleman who tends his lawn and found that he had difficulty finding the words he wanted to say. He had no difficulty understanding other people when they spoke. Throughout the day he had some recurrence of this, and tonight came be for evaluation. Initial workup is within normal limits, patient has a pacemaker is unsure if he be able to get an MRI. Hospitalists were called for admission and further management  PAST MEDICAL HISTORY:   Past Medical History:  Diagnosis Date  . Aortic valve regurgitation   . Atrial fibrillation ([place])   . Barrett's esophagus   . COPD (chronic obstructive pulmonary disease) ([place])   . Diverticulosis   . Gout   . Hematuria   . History of prosthetic heart valve   . Hyperlipidemia   . Hypertension   . Nodular prostate with urinary obstruction   . Substance abuse    alcohol    PAST SURGICAL HISTORY:   Past Surgical History:  Procedure Laterality Date  . CARPAL TUNNEL RELEASE Right 2015  . CATARACT EXTRACTION Right 2017  . VASECTOMY      SOCIAL HISTORY:   Social History  Substance Use Topics  . Smoking status: Former Smoker    Types: Cigarettes    Quit date: 04/24/1991  . Smokeless tobacco: Never Used  . Alcohol use Yes     Comment: quit December 2015/restarted    FAMILY HISTORY:   Family History  Problem Relation Age of Onset  . Heart attack Mother   . Diabetes Maternal Grandmother    . Cancer Sister        lung    DRUG ALLERGIES:   Allergies  Allergen Reactions  . Prednisone     MEDICATIONS AT HOME:   Prior to Admission medications   Medication Sig Start Date End Date Taking? Authorizing Provider  acetaminophen (TYLENOL) 500 MG tablet Take 500 mg by mouth 3 (three) times daily.   Yes [provider]  aspirin EC 81 MG tablet Take 81 mg by mouth daily.   Yes [provider]  cephALEXin (KEFLEX) 500 MG capsule Take 1 capsule (500 mg total) by mouth 3 (three) times daily. 06/10/17  Yes Volney American, PA-C  colchicine 0.6 MG tablet Take 1 tablet (0.6 mg total) by mouth daily. 06/06/17  Yes Volney American, PA-C  diltiazem (DILACOR XR) 180 MG 24 hr capsule TAKE 1 CAPSULE BY MOUTH ONCE DAILY 11/25/16  Yes Johnson, Megan P, DO  febuxostat (ULORIC) 40 MG tablet Take 1 tablet (40 mg total) by mouth daily. 04/06/17  Yes Volney American, PA-C  ferrous sulfate 325 (65 FE) MG tablet Take 325 mg by mouth once a week. Reported on 04/05/2016   Yes [provider]  fexofenadine (ALLEGRA) 180 MG tablet Take 180 mg by mouth daily.   Yes [provider]  fluticasone (FLONASE) 50 MCG/ACT nasal spray Place 2 sprays  into both nostrils daily. 10/11/16  Yes Crissman, Jeannette How, MD  furosemide (LASIX) 40 MG tablet Take 40 mg by mouth daily.    Yes [provider]  ketoconazole (NIZORAL) 2 % shampoo Apply 1 application topically as needed for irritation.   Yes [provider]  losartan (COZAAR) 25 MG tablet Take 1 tablet (25 mg total) by mouth daily. 10/11/16  Yes Crissman, Jeannette How, MD  magnesium 30 MG tablet Take 30 mg by mouth daily.   Yes [provider]  metoprolol succinate (TOPROL-XL) 50 MG 24 hr tablet Take 1 tablet (50 mg total) by mouth 2 (two) times daily. Take with or immediately following a meal. 10/11/16  Yes Crissman, Jeannette How, MD  Multiple Vitamins-Minerals (CENTRUM ADULTS PO) Take by mouth daily.   Yes  [provider]  mupirocin ointment (BACTROBAN) 2 % Apply 1 application topically 2 (two) times daily. 06/05/17  Yes Frederich Cha, MD  omeprazole (PRILOSEC) 20 MG capsule Take 1 capsule (20 mg total) by mouth 2 (two) times daily before a meal. 10/11/16  Yes Crissman, Jeannette How, MD  tiotropium (SPIRIVA) 18 MCG inhalation capsule Place 1 capsule (18 mcg total) into inhaler and inhale daily. 10/11/16  Yes Crissman, Jeannette How, MD  warfarin (COUMADIN) 3 MG tablet TAKE 1 TABLET BY MOUTH ONCE DAILY 03/13/17   Kirk Maple, MD    REVIEW OF SYSTEMS:  Review of Systems  Constitutional: Negative for chills, fever, malaise/fatigue and weight loss.  HENT: Negative for ear pain, hearing loss and tinnitus.   Eyes: Negative for blurred vision, double vision, pain and redness.  Respiratory: Negative for cough, hemoptysis and shortness of breath.   Cardiovascular: Negative for chest pain, palpitations, orthopnea and leg swelling.  Gastrointestinal: Negative for abdominal pain, constipation, diarrhea, nausea and vomiting.  Genitourinary: Negative for dysuria, frequency and hematuria.  Musculoskeletal: Negative for back pain, joint pain and neck pain.  Skin:       No acne, rash, or lesions  Neurological: Positive for speech change. Negative for dizziness, tremors, focal weakness and weakness.  Endo/Heme/Allergies: Negative for polydipsia. Does not bruise/bleed easily.  Psychiatric/Behavioral: Negative for depression. The patient is not nervous/anxious and does not have insomnia.      VITAL SIGNS:   Vitals:   06/13/17 1935 06/13/17 1944 06/13/17 2030 06/13/17 2056  BP:   (!) 152/88   Pulse:   92   Resp:   13   Temp:    97.8 F (36.6 C)  TempSrc:      SpO2:   98%   Weight: 68 kg (150 lb) 68 kg (150 lb)    Height: 5\' 3"  (1.6 m) 5\' 3"  (1.6 m)     Wt Readings from Last 3 Encounters:  06/13/17 68 kg (150 lb)  06/13/17 70.3 kg (155 lb)  06/09/17 70.3 kg (155 lb)    PHYSICAL EXAMINATION:   Physical Exam  Vitals reviewed. Constitutional: He is oriented to person, place, and time. He appears well-developed and well-nourished. No distress.  HENT:  Head: Normocephalic and atraumatic.  Mouth/Throat: Oropharynx is clear and moist.  Eyes: Pupils are equal, round, and reactive to light. Conjunctivae and EOM are normal. No scleral icterus.  Neck: Normal range of motion. Neck supple. No JVD present. No thyromegaly present.  Cardiovascular: Normal rate, regular rhythm and intact distal pulses.  Exam reveals no gallop and no friction rub.   No murmur heard. Respiratory: Effort normal and breath sounds normal. No respiratory distress. He has no  wheezes. He has no rales.  GI: Soft. Bowel sounds are normal. He exhibits no distension. There is no tenderness.  Musculoskeletal: Normal range of motion. He exhibits no edema.  No arthritis, no gout  Lymphadenopathy:    He has no cervical adenopathy.  Neurological: He is alert and oriented to person, place, and time. No cranial nerve deficit.  No focal deficit, neurological exam is within normal limits  Skin: Skin is warm and dry. No rash noted. No erythema.  Psychiatric: He has a normal mood and affect. His behavior is normal. Judgment and thought content normal.    LABORATORY PANEL:   CBC  Recent Labs Lab 06/13/17 1950  WBC 6.4  HGB 14.1  HCT 40.7  PLT 247   ------------------------------------------------------------------------------------------------------------------  Chemistries   Recent Labs Lab 06/13/17 1950  NA 137  K 4.1  CL 99*  CO2 27  GLUCOSE 128*  BUN 20  CREATININE 1.23  CALCIUM 9.6  AST 37  ALT 15*  ALKPHOS 62  BILITOT 1.1   ------------------------------------------------------------------------------------------------------------------  Cardiac Enzymes  Recent Labs Lab 06/13/17 1950  TROPONINI 0.03*    ------------------------------------------------------------------------------------------------------------------  RADIOLOGY:  Ct Head Code Stroke W/o Cm  Result Date: 06/13/2017 CLINICAL DATA:  Code stroke.  Slurred speech and confusion EXAM: CT HEAD WITHOUT CONTRAST TECHNIQUE: Contiguous axial images were obtained from the base of the skull through the vertex without intravenous contrast. COMPARISON:  Head CT 02/14/2015 FINDINGS: Brain: No mass lesion or acute hemorrhage. No focal hypoattenuation of the basal ganglia or cortex to indicate infarcted tissue. No hydrocephalus or age advanced atrophy. There is periventricular hypoattenuation compatible with chronic microvascular disease. Vascular: No hyperdense vessel. Atherosclerotic calcification of the internal carotid and vertebral arteries at the skull base. Skull: Normal visualized skull base, calvarium and extracranial soft tissues. Sinuses/Orbits: No sinus fluid levels or advanced mucosal thickening. No mastoid effusion. Normal orbits. ASPECTS [facility] Stroke Program Early CT Score) - Ganglionic level infarction (caudate, lentiform nuclei, internal capsule, insula, M1-M3 cortex): 7 - Supraganglionic infarction (M4-M6 cortex): 3 Total score (0-10 with 10 being normal): 10 IMPRESSION: 1. No hemorrhage or mass lesion. 2. ASPECTS is 10. These results were called by telephone at the time of interpretation on 06/13/2017 at 7:50 pm to Dr. Mable Paris, who verbally acknowledged these results. Electronically Signed   By: Ulyses Jarred M.D.   On: 06/13/2017 19:50    EKG:   Orders placed or performed during the hospital encounter of 06/13/17  . ED EKG  . ED EKG    IMPRESSION AND PLAN:  Principal Problem:   Expressive aphasia - patient is currently not demonstrating the symptom. However, we will admit him per stroke order set with corresponding imaging, labs, and consults. Active Problems:   Atrial fibrillation (HCC) - continue home meds including  anticoagulation   Hypertension - continue home medications   COPD (chronic obstructive pulmonary disase) (HCC) - home dose inhalers   CAD (coronary artery disease) - continue home meds   CKD (chronic kidney disease), stage III - avoid nephrotoxins and monitor  All the records are reviewed and case discussed with ED provider. Management plans discussed with the patient and/or family.  DVT PROPHYLAXIS: Systemic anticoagulation  GI PROPHYLAXIS: PPI  ADMISSION STATUS: Inpatient  CODE STATUS: Full Code Status History    This patient does not have a recorded code status. Please follow your organizational policy for patients in this situation.      TOTAL TIME TAKING CARE OF THIS PATIENT: 45 minutes.   Kirk Jones,  Kirk Jones FIELDING 06/13/2017, 9:20 PM  [facility]  Office  [phone]  CC: Primary care physician; Kirk Maple, MD  Note:  This document was prepared using Dragon voice recognition software and may include unintentional dictation errors.

## 2017-06-13 NOTE — ED Triage Notes (Signed)
Pt arrived via POV for c/o aphasia/dizziness/slurred speech that started at 9am - pt denies any pain - pt reports vision changes - pt told PCP what was going on and he referred him to the er

## 2017-06-13 NOTE — ED Notes (Signed)
Paged neuro to give lab results as he requested

## 2017-06-13 NOTE — ED Notes (Signed)
Elevated troponin of 0.03 reported to Dr Joni Fears - no new orders at this time

## 2017-06-13 NOTE — ED Provider Notes (Signed)
[facility] Emergency Department Provider Note  ____________________________________________  Time seen: Approximately 8:49 PM  I have reviewed the triage vital signs and the nursing notes.   HISTORY  Chief Complaint Aphasia and Dizziness    HPI Kirk Jones is a 76 y.o. male who reports speech difficulty and word finding difficulty that started around 9 or 9:30 AM today. The patient reports that bedtime last night he was feeling normal. When he woke up this morning he felt normal, but within an hour he had noticed these language deficits. A neighbor spoke with him at 9:30 AM and noticed the same issues. His family member spoke with him at 4:30 PM and noticed the same issues. Patient denies any chest pain or neck pain. He does feel dizzy and have a mild generalized headache. No recent head trauma. No fevers chills or neck stiffness. No aggravating or alleviating factors. Symptoms have been constant all day. Never had anything like this before, no history of stroke. Takes a baby aspirin daily. He is on warfarin which he recently held for a few days in the setting of some minor trauma 2 weeks ago, but is now back on it..     Past Medical History:  Diagnosis Date  . Aortic valve regurgitation   . Atrial fibrillation ([place])   . Barrett's esophagus   . COPD (chronic obstructive pulmonary disease) ([place])   . Diverticulosis   . Gout   . Hematuria   . History of prosthetic heart valve   . Hyperlipidemia   . Hypertension   . Nodular prostate with urinary obstruction   . Substance abuse    alcohol     Patient Active Problem List   Diagnosis Date Noted  . Expressive aphasia 06/13/2017  . CAD (coronary artery disease) 10/06/2015  . Pacemaker 10/06/2015  . Automatic implantable cardioverter-defibrillator in situ 10/06/2015  . Nodular prostate with urinary obstruction 10/06/2015  . Senile purpura ([place]) 10/06/2015  . Barrett's esophagus 05/28/2015  .  Hypertension 05/28/2015  . COPD (chronic obstructive pulmonary disease) ([place]) 05/28/2015  . Hyperlipidemia 05/28/2015  . Diverticulosis 05/28/2015  . CKD (chronic kidney disease), stage III 05/28/2015  . Gout 05/28/2015  . History of prosthetic heart valve 05/13/2015  . Atrial fibrillation ([place]) 05/13/2015     Past Surgical History:  Procedure Laterality Date  . CARPAL TUNNEL RELEASE Right 2015  . CATARACT EXTRACTION Right 2017  . VASECTOMY       Prior to Admission medications   Medication Sig Start Date End Date Taking? Authorizing Provider  acetaminophen (TYLENOL) 500 MG tablet Take 500 mg by mouth 3 (three) times daily.    [provider]  amoxicillin (AMOXIL) 500 MG capsule Take 500 mg by mouth as needed. 04/06/17   [provider]  aspirin EC 81 MG tablet Take 81 mg by mouth daily.    [provider]  cephALEXin (KEFLEX) 500 MG capsule Take 1 capsule (500 mg total) by mouth 3 (three) times daily. 06/10/17   Volney American, PA-C  colchicine 0.6 MG tablet Take 1 tablet (0.6 mg total) by mouth daily. 06/06/17   Volney American, PA-C  diltiazem (DILACOR XR) 180 MG 24 hr capsule TAKE 1 CAPSULE BY MOUTH ONCE DAILY 11/25/16   Wynetta Emery, Megan P, DO  febuxostat (ULORIC) 40 MG tablet Take 1 tablet (40 mg total) by mouth daily. 04/06/17   Volney American, PA-C  ferrous sulfate 325 (65 FE) MG tablet Take 325 mg by mouth once  a week. Reported on 04/05/2016    [provider]  fexofenadine (ALLEGRA) 180 MG tablet Take 180 mg by mouth daily.    [provider]  fluticasone (FLONASE) 50 MCG/ACT nasal spray Place 2 sprays into both nostrils daily. 10/11/16   Guadalupe Maple, MD  furosemide (LASIX) 40 MG tablet Take 40 mg by mouth daily.     [provider]  ketoconazole (NIZORAL) 2 % shampoo Apply 1 application topically as needed for irritation.    [provider]  losartan (COZAAR) 25 MG tablet Take 1 tablet (25 mg  total) by mouth daily. 10/11/16   Guadalupe Maple, MD  magnesium 30 MG tablet Take 30 mg by mouth daily.    [provider]  metoprolol succinate (TOPROL-XL) 50 MG 24 hr tablet Take 1 tablet (50 mg total) by mouth 2 (two) times daily. Take with or immediately following a meal. 10/11/16   Crissman, Jeannette How, MD  Multiple Vitamins-Minerals (CENTRUM ADULTS PO) Take by mouth daily.    [provider]  mupirocin ointment (BACTROBAN) 2 % Apply 1 application topically 2 (two) times daily. 06/05/17   Frederich Cha, MD  omeprazole (PRILOSEC) 20 MG capsule Take 1 capsule (20 mg total) by mouth 2 (two) times daily before a meal. 10/11/16   Guadalupe Maple, MD  tiotropium (SPIRIVA) 18 MCG inhalation capsule Place 1 capsule (18 mcg total) into inhaler and inhale daily. 10/11/16   Guadalupe Maple, MD  warfarin (COUMADIN) 3 MG tablet TAKE 1 TABLET BY MOUTH ONCE DAILY 03/13/17   Guadalupe Maple, MD     Allergies Prednisone   Family History  Problem Relation Age of Onset  . Heart attack Mother   . Diabetes Maternal Grandmother   . Cancer Sister        lung    Social History Social History  Substance Use Topics  . Smoking status: Former Smoker    Types: Cigarettes    Quit date: 04/24/1991  . Smokeless tobacco: Never Used  . Alcohol use Yes     Comment: quit December 2015/restarted    Review of Systems  Constitutional:   No fever or chills.  ENT:   No sore throat. No rhinorrhea. Cardiovascular:   No chest pain or syncope. Respiratory:   No dyspnea or cough. Gastrointestinal:   Negative for abdominal pain, vomiting and diarrhea.  Musculoskeletal:   Negative for focal pain or swelling All other systems reviewed and are negative except as documented above in ROS and HPI.  ____________________________________________   PHYSICAL EXAM:  VITAL SIGNS: ED Triage Vitals  Enc Vitals Group     BP 06/13/17 1934 (!) 143/83     Pulse Rate 06/13/17 1934 68     Resp 06/13/17 1934 16      Temp 06/13/17 1934 98.2 F (36.8 C)     Temp Source 06/13/17 1934 Oral     SpO2 06/13/17 1934 98 %     Weight 06/13/17 1935 150 lb (68 kg)     Height 06/13/17 1935 5\' 3"  (1.6 m)     Head Circumference --      Peak Flow --      Pain Score 06/13/17 1943 0     Pain Loc --      Pain Edu? --      Excl. in [place]? --     Vital signs reviewed, nursing assessments reviewed.   Constitutional:   Alert and oriented.Not in distress. Eyes:   No  scleral icterus.  EOMI. No nystagmus. No conjunctival pallor. PERRL. disconjugate gaze, chronic per patient ENT   Head:   Normocephalic and atraumatic.   Nose:   No congestion/rhinnorhea.    Mouth/Throat:   MMM, no pharyngeal erythema. No peritonsillar mass.    Neck:   No meningismus. Full ROM Hematological/Lymphatic/Immunilogical:   No cervical lymphadenopathy. Cardiovascular:   RRR. Symmetric bilateral radial and DP pulses.  No murmurs.  Respiratory:   Normal respiratory effort without tachypnea/retractions. Breath sounds are clear and equal bilaterally. No wheezes/rales/rhonchi. Gastrointestinal:   Soft and nontender. Non distended. There is no CVA tenderness.  No rebound, rigidity, or guarding. Genitourinary:   deferred Musculoskeletal:   Normal range of motion in all extremities. No joint effusions.  No lower extremity tenderness.  No edema. Neurologic:   Mild dysarthria. Hesitancy with word finding.  Motor grossly intact. 5/5 in large muscle groups of bilateral upper and lower extremities Normal finger to nose. No pronator drift. NIH stroke scale 2 No gross focal neurologic deficits are appreciated.  Skin:    Skin is warm, dry and intact. No rash noted.  No petechiae, purpura, or bullae. Healing superficial wounds on the left forearm and left knee.  ____________________________________________    LABS (pertinent positives/negatives) (all labs ordered are listed, but only abnormal results are displayed) Labs Reviewed   PROTIME-INR - Abnormal; Notable for the following:       Result Value   Prothrombin Time 17.5 (*)    All other components within normal limits  CBC - Abnormal; Notable for the following:    RBC 3.98 (*)    MCV 102.3 (*)    MCH 35.5 (*)    RDW 14.7 (*)    All other components within normal limits  DIFFERENTIAL - Abnormal; Notable for the following:    Lymphs Abs 0.8 (*)    Monocytes Absolute 1.1 (*)    All other components within normal limits  COMPREHENSIVE METABOLIC PANEL - Abnormal; Notable for the following:    Chloride 99 (*)    Glucose, Bld 128 (*)    ALT 15 (*)    GFR calc non Af Amer 55 (*)    All other components within normal limits  TROPONIN I - Abnormal; Notable for the following:    Troponin I 0.03 (*)    All other components within normal limits  APTT  CBG MONITORING, ED   ____________________________________________   EKG  Interpreted by me Atrial fibrillation rate of 93, rightward axis. Right bundle branch block. No acute ischemic changes.  ____________________________________________    RADIOLOGY  Ct Head Code Stroke W/o Cm  Result Date: 06/13/2017 CLINICAL DATA:  Code stroke.  Slurred speech and confusion EXAM: CT HEAD WITHOUT CONTRAST TECHNIQUE: Contiguous axial images were obtained from the base of the skull through the vertex without intravenous contrast. COMPARISON:  Head CT 02/14/2015 FINDINGS: Brain: No mass lesion or acute hemorrhage. No focal hypoattenuation of the basal ganglia or cortex to indicate infarcted tissue. No hydrocephalus or age advanced atrophy. There is periventricular hypoattenuation compatible with chronic microvascular disease. Vascular: No hyperdense vessel. Atherosclerotic calcification of the internal carotid and vertebral arteries at the skull base. Skull: Normal visualized skull base, calvarium and extracranial soft tissues. Sinuses/Orbits: No sinus fluid levels or advanced mucosal thickening. No mastoid effusion. Normal  orbits. ASPECTS [facility] Stroke Program Early CT Score) - Ganglionic level infarction (caudate, lentiform nuclei, internal capsule, insula, M1-M3 cortex): 7 - Supraganglionic infarction (M4-M6 cortex): 3 Total score (0-10 with 10  being normal): 10 IMPRESSION: 1. No hemorrhage or mass lesion. 2. ASPECTS is 10. These results were called by telephone at the time of interpretation on 06/13/2017 at 7:50 pm to Dr. Mable Paris, who verbally acknowledged these results. Electronically Signed   By: Ulyses Jarred M.D.   On: 06/13/2017 19:50    ____________________________________________   PROCEDURES Procedures CRITICAL CARE Performed by: Joni Fears, Trek Kimball   Total critical care time: 35 minutes  Critical care time was exclusive of separately billable procedures and treating other patients.  Critical care was necessary to treat or prevent imminent or life-threatening deterioration.  Critical care was time spent personally by me on the following activities: development of treatment plan with patient and/or surrogate as well as nursing, discussions with consultants, evaluation of patient's response to treatment, examination of patient, obtaining history from patient or surrogate, ordering and performing treatments and interventions, ordering and review of laboratory studies, ordering and review of radiographic studies, pulse oximetry and re-evaluation of patient's condition.  ____________________________________________   INITIAL IMPRESSION / ASSESSMENT AND PLAN / ED COURSE  Pertinent labs & imaging results that were available during my care of the patient were reviewed by me and considered in my medical decision making (see chart for details).  Patient presents within 12 hours of symptom onset, code stroke initiated. NIH stroke scale is low with mild deficits. Possibly TPA candidate, will follow up neurology recommendations.  At about 8:00 PM Dr. Zenia Resides from neurology called, wondering if INR result was  available so that he could provide a full discussion and advised to the patient and family regarding TPA. He considers the patient a TPA candidate.  Clinical Course as of Jun 13 2057  Mon Jun 13, 2017  2035 INR resulted. Neuro updated, who will continue TPA discussion with patient and family.   [PS]  2047 Follow up call from Neuro Dr. Zenia Resides.  Together with pt and family, they have decided against TPA.  Full dose aspirin, admit for stroke workup.  [PS]    Clinical Course User Index [PS] Carrie Mew, MD     ____________________________________________   FINAL CLINICAL IMPRESSION(S) / ED DIAGNOSES  Final diagnoses:  Acute ischemic stroke [facility])      New Prescriptions   No medications on file     Portions of this note were generated with dragon dictation software. Dictation errors may occur despite best attempts at proofreading.    Carrie Mew, MD 06/13/17 2059

## 2017-06-13 NOTE — Progress Notes (Signed)
ANTICOAGULATION CONSULT NOTE - Initial Consult  Pharmacy Consult for heparin Indication: atrial fibrillation  Allergies  Allergen Reactions  . Prednisone Other (See Comments)    Reaction: "organs shutting down"    Patient Measurements: Height: 5\' 3"  (160 cm) Weight: 150 lb (68 kg) IBW/kg (Calculated) : 56.9 Heparin Dosing Weight: 68 kg  Vital Signs: Temp: 97.9 F (36.6 C) (07/16 2315) Temp Source: Oral (07/16 2315) BP: 146/82 (07/16 2315) Pulse Rate: 60 (07/16 2315)  Labs:  Recent Labs  06/13/17 1950  HGB 14.1  HCT 40.7  PLT 247  APTT 29  LABPROT 17.5*  INR 1.42  CREATININE 1.23  TROPONINI 0.03*    Estimated Creatinine Clearance: 41.1 mL/min (by C-G formula based on SCr of 1.23 mg/dL).   Medical History: Past Medical History:  Diagnosis Date  . Aortic valve regurgitation   . Atrial fibrillation ([place])   . Barrett's esophagus   . COPD (chronic obstructive pulmonary disease) ([place])   . Diverticulosis   . Gout   . Hematuria   . History of prosthetic heart valve   . Hyperlipidemia   . Hypertension   . Nodular prostate with urinary obstruction   . Substance abuse    alcohol    Medications:  Scheduled:  . [START ON 06/14/2017] aspirin EC  81 mg Oral Daily  . [START ON 06/14/2017] febuxostat  40 mg Oral Daily  . [START ON 06/14/2017] heparin  4,000 Units Intravenous Once  . [START ON 06/14/2017] pantoprazole  40 mg Oral Daily  . [START ON 06/14/2017] tiotropium  18 mcg Inhalation Daily  . [START ON 06/14/2017] warfarin  4 mg Oral q1800    Assessment: Patient admitted for slurred speech and is anticoagulated w/ warfarin. Is being started on heparin drip INR currently subtherapeutic at 1.42 Warfarin home dose of 3.5 mg daily  INR 7/16 1.42  Goal of Therapy:  Heparin level 0.3-0.7 units/ml Monitor platelets by anticoagulation protocol: Yes   Plan:  Give 4000 units bolus x 1  Will start heparin drip at 800 unit/hr Will check Hl @ 0800 7/17 Baseline  labs ordered.  Tobie Lords, PharmD, BCPS Clinical Pharmacist 06/13/2017

## 2017-06-13 NOTE — Progress Notes (Addendum)
BP 140/81   Pulse (!) 109   Temp 98.3 F (36.8 C)   Wt 155 lb (70.3 kg)   BMI 27.46 kg/m    Subjective:    [name], male    DOB: 01-11-1941, 76 y.o.   MRN: [id number]  HPI: Kirk Jones is a 76 y.o. male  Chief Complaint  Patient presents with  . Wound Check   Patient presents today for wound check. Wounds healing well, without complication. Denies redness, purulence, bleeding, fevers, chills. Has kept bandages in place since last dressed Friday.   Noted that pt was quieter than usual, and on questioning, states he had some word finding difficulty earlier this morning that he was really concerned about. Back to baseline for the most part now with no other deficits noted.   Past Medical History:  Diagnosis Date  . Aortic valve regurgitation   . Atrial fibrillation ([place])   . Barrett's esophagus   . COPD (chronic obstructive pulmonary disease) ([place])   . Diverticulosis   . Gout   . Hematuria   . History of prosthetic heart valve   . Hyperlipidemia   . Hypertension   . Nodular prostate with urinary obstruction   . Substance abuse    alcohol   Social History   Social History  . Marital status: Married    Spouse name: N/A  . Number of children: N/A  . Years of education: N/A   Occupational History  . Not on file.   Social History Main Topics  . Smoking status: Former Smoker    Types: Cigarettes    Quit date: 04/24/1991  . Smokeless tobacco: Never Used  . Alcohol use Yes     Comment: quit December 2015/restarted  . Drug use: No  . Sexual activity: No   Other Topics Concern  . Not on file   Social History Narrative  . No narrative on file    Relevant past medical, surgical, family and social history reviewed and updated as indicated. Interim medical history since our last visit reviewed. Allergies and medications reviewed and updated.  Review of Systems  Constitutional: Negative.   Respiratory: Negative.   Cardiovascular:  Negative.   Gastrointestinal: Negative.   Musculoskeletal: Negative.   Skin: Positive for wound.  Neurological: Negative.   Psychiatric/Behavioral: Negative.    Per HPI unless specifically indicated above     Objective:    BP 140/81   Pulse (!) 109   Temp 98.3 F (36.8 C)   Wt 155 lb (70.3 kg)   BMI 27.46 kg/m   Wt Readings from Last 3 Encounters:  06/13/17 150 lb (68 kg)  06/13/17 155 lb (70.3 kg)  06/09/17 155 lb (70.3 kg)    Physical Exam  Constitutional: He is oriented to person, place, and time. He appears well-developed and well-nourished.  HENT:  Head: Atraumatic.  Eyes: Pupils are equal, round, and reactive to light. Conjunctivae are normal.  Neck: Normal range of motion. Neck supple.  Cardiovascular: Normal rate.   Pulmonary/Chest: Effort normal. No respiratory distress.  Musculoskeletal: Normal range of motion.  Neurological: He is alert and oriented to person, place, and time. No cranial nerve deficit. He exhibits normal muscle tone. Coordination normal.  Skin: Skin is warm and dry.  Small abrasion of right forearm almost completely closed up, healing very well.   Abrasion of left knee and left forearm both healing well, still with an open area on each but no evidence of infection  Psychiatric: He has a normal mood and affect. His behavior is normal.  Much quieter and more reserved than usual, appears worried but alert  Nursing note and vitals reviewed.  Results for orders placed or performed in visit on 06/06/17  CoaguChek XS/INR Waived  Result Value Ref Range   INR 2.5 (H) 0.9 - 1.1   Prothrombin Time 30.3 sec      Assessment & Plan:   Problem List Items Addressed This Visit      Other   Expressive aphasia    Discussed with patient that he should go to the ER for further eval of these sxs despite being mostly back to baseline. Pt agreeable       Other Visit Diagnoses    Skin abrasion    -  Primary   Wounds healing well. All were cleaned with  sterile saline and re-dressed with neosporin and non-stick gauze, wrapped with ace wrap. F/u in 3-4 days       Follow up plan: Return in about 3 days (around 06/16/2017) for Wound check.

## 2017-06-13 NOTE — Patient Instructions (Signed)
Continue wound care with neosporin, non-stick gauze, ace wrap

## 2017-06-13 NOTE — Progress Notes (Signed)
ANTICOAGULATION CONSULT NOTE - Initial Consult  Pharmacy Consult for warfarin Indication: atrial fibrillation  Allergies  Allergen Reactions  . Prednisone Other (See Comments)    Reaction: "organs shutting down"    Patient Measurements: Height: 5\' 3"  (160 cm) Weight: 150 lb (68 kg) IBW/kg (Calculated) : 56.9 Heparin Dosing Weight: 68 kg  Vital Signs: Temp: 97.9 F (36.6 C) (07/16 2315) Temp Source: Oral (07/16 2315) BP: 146/82 (07/16 2315) Pulse Rate: 60 (07/16 2315)  Labs:  Recent Labs  06/13/17 1950  HGB 14.1  HCT 40.7  PLT 247  APTT 29  LABPROT 17.5*  INR 1.42  CREATININE 1.23  TROPONINI 0.03*    Estimated Creatinine Clearance: 41.1 mL/min (by C-G formula based on SCr of 1.23 mg/dL).   Medical History: Past Medical History:  Diagnosis Date  . Aortic valve regurgitation   . Atrial fibrillation ([place])   . Barrett's esophagus   . COPD (chronic obstructive pulmonary disease) ([place])   . Diverticulosis   . Gout   . Hematuria   . History of prosthetic heart valve   . Hyperlipidemia   . Hypertension   . Nodular prostate with urinary obstruction   . Substance abuse    alcohol    Medications:  Scheduled:  . aspirin EC  81 mg Oral Daily  . febuxostat  40 mg Oral Daily  . heparin  4,000 Units Intravenous Once  . pantoprazole  40 mg Oral Daily  . tiotropium  18 mcg Inhalation Daily  . warfarin  4 mg Oral q1800    Assessment: Patient admitted for slurred speech is anticoagulated w/ warfarin for chronic afib. Patient's home dose:  Warfarin 3.5 mg daily.  INR 7/17 1.42  Goal of Therapy:  INR 2-3 Monitor platelets by anticoagulation protocol: Yes   Plan:  INR is currently subtherapeutic from goal range of INR 2 - 3. Will empirically start a dose of warfarin 4 mg daily and will follow INRs daily. Will start heparin bridge until two consecutive INRs of 2 - 3 for at least 5 days.  Tobie Lords, PharmD, BCPS Clinical Pharmacist 06/14/2017

## 2017-06-14 ENCOUNTER — Inpatient Hospital Stay: Payer: Medicare Other

## 2017-06-14 ENCOUNTER — Inpatient Hospital Stay
Admit: 2017-06-14 | Discharge: 2017-06-14 | Disposition: A | Discharge status: Home / Self Care | Payer: Medicare Other | Attending: Internal Medicine | Admitting: Internal Medicine

## 2017-06-14 DIAGNOSIS — I10 Essential (primary) hypertension: Secondary | ICD-10-CM | POA: Diagnosis not present

## 2017-06-14 DIAGNOSIS — I6523 Occlusion and stenosis of bilateral carotid arteries: Secondary | ICD-10-CM | POA: Diagnosis not present

## 2017-06-14 DIAGNOSIS — G459 Transient cerebral ischemic attack, unspecified: Secondary | ICD-10-CM | POA: Diagnosis not present

## 2017-06-14 DIAGNOSIS — I4891 Unspecified atrial fibrillation: Secondary | ICD-10-CM | POA: Diagnosis not present

## 2017-06-14 DIAGNOSIS — I638 Other cerebral infarction: Secondary | ICD-10-CM | POA: Diagnosis not present

## 2017-06-14 DIAGNOSIS — J449 Chronic obstructive pulmonary disease, unspecified: Secondary | ICD-10-CM | POA: Diagnosis not present

## 2017-06-14 DIAGNOSIS — R4701 Aphasia: Secondary | ICD-10-CM | POA: Diagnosis not present

## 2017-06-14 LAB — LIPID PANEL
CHOLESTEROL: 189 mg/dL (ref 0–200)
HDL: 75 mg/dL (ref >40)
LDL Cholesterol: 99 mg/dL (ref 0–99)
Total CHOL/HDL Ratio: 2.5 RATIO
Triglycerides: 77 mg/dL (ref <150)
VLDL: 15 mg/dL (ref 0–40)

## 2017-06-14 LAB — HEPARIN LEVEL (UNFRACTIONATED)
HEPARIN UNFRACTIONATED: 0.42 [IU]/mL (ref 0.30–0.70)
HEPARIN UNFRACTIONATED: 0.17 [IU]/mL — AB (ref 0.30–0.70)

## 2017-06-14 LAB — PROTIME-INR
INR: 1.46
Prothrombin Time: 17.9 seconds — ABNORMAL HIGH (ref 11.4–15.2)

## 2017-06-14 LAB — ECHOCARDIOGRAM COMPLETE
HEIGHTINCHES: 63 in
Weight: 2400 oz

## 2017-06-14 MED ORDER — MUPIROCIN CALCIUM 2 % EX CREA
TOPICAL_CREAM | Freq: Every day | CUTANEOUS | Status: DC
  Administered 2017-06-14 – 2017-06-15 (×2): via TOPICAL
  Filled 2017-06-14: qty 15

## 2017-06-14 MED ORDER — HEPARIN BOLUS VIA INFUSION
2050.0000 [IU] | Freq: Once | INTRAVENOUS | Status: AC
  Administered 2017-06-15: 2050 [IU] via INTRAVENOUS
  Filled 2017-06-14: qty 2050

## 2017-06-14 MED ORDER — DILTIAZEM HCL ER COATED BEADS 180 MG PO CP24
180.0000 mg | ORAL_CAPSULE | Freq: Every day | ORAL | Status: DC
  Administered 2017-06-14 – 2017-06-15 (×2): 180 mg via ORAL
  Filled 2017-06-14 (×2): qty 1

## 2017-06-14 MED ORDER — METOPROLOL SUCCINATE ER 50 MG PO TB24
50.0000 mg | ORAL_TABLET | Freq: Two times a day (BID) | ORAL | Status: DC
  Administered 2017-06-14 – 2017-06-15 (×3): 50 mg via ORAL
  Filled 2017-06-14 (×3): qty 1

## 2017-06-14 NOTE — Progress Notes (Signed)
ANTICOAGULATION CONSULT NOTE - Follow UP   Pharmacy Consult for heparin Indication: atrial fibrillation  Allergies  Allergen Reactions  . Prednisone Other (See Comments)    Reaction: "organs shutting down"    Patient Measurements: Height: 5\' 3"  (160 cm) Weight: 150 lb (68 kg) IBW/kg (Calculated) : 56.9 Heparin Dosing Weight: 68 kg  Vital Signs: Temp: 97.6 F (36.4 C) (07/17 1059) Temp Source: Oral (07/17 1059) BP: 155/90 (07/17 1059) Pulse Rate: 88 (07/17 1059)  Labs:  Recent Labs  06/13/17 1950 06/14/17 0453 06/14/17 1117  HGB 14.1  --   --   HCT 40.7  --   --   PLT 247  --   --   APTT 29  --   --   LABPROT 17.5* 17.9*  --   INR 1.42 1.46  --   HEPARINUNFRC  --   --  0.42  CREATININE 1.23  --   --   TROPONINI 0.03*  --   --     Estimated Creatinine Clearance: 41.1 mL/min (by C-G formula based on SCr of 1.23 mg/dL).   Medical History: Past Medical History:  Diagnosis Date  . Aortic valve regurgitation   . Atrial fibrillation ([place])   . Barrett's esophagus   . COPD (chronic obstructive pulmonary disease) ([place])   . Diverticulosis   . Gout   . Hematuria   . History of prosthetic heart valve   . Hyperlipidemia   . Hypertension   . Nodular prostate with urinary obstruction   . Substance abuse    alcohol    Medications:  Scheduled:  . aspirin EC  81 mg Oral Daily  . febuxostat  40 mg Oral Daily  . pantoprazole  40 mg Oral Daily  . tiotropium  18 mcg Inhalation Daily  . warfarin  4 mg Oral q1800    Assessment: Patient admitted for slurred speech and is anticoagulated w/ warfarin. Is being started on heparin drip INR currently subtherapeutic at 1.42 Warfarin home dose of 3.5 mg daily  INR 7/16 1.42  Goal of Therapy:  Heparin level 0.3-0.7 units/ml Monitor platelets by anticoagulation protocol: Yes   Plan:  1st Heparin level within goal range. Will continue current rate. Will recheck heparin level @1930 .   Larene Beach,  PharmD  Clinical Pharmacist 06/14/2017

## 2017-06-14 NOTE — Progress Notes (Signed)
SLP Cancellation Note  Patient Details Name: Sirius Woodford MRN: [id number] DOB: 1941-09-24   Cancelled treatment:       Reason Eval/Treat Not Completed: SLP screened, no needs identified, will sign off (chart reviewed; consulted NSG then met w/ pt and wife)  Pt denied any difficulty swallowing and is currently on a regular diet; tolerates swallowing pills w/ water per NSG. Pt conversed at conversational level w/out deficits noted this morning - pt stated he was getting his words out "fine now"; pt and family denied any speech-language deficits. Pt was reading his menu and ordering his lunch appropriately. No further skilled ST services indicated as pt appears at his baseline. Pt agreed. NSG to reconsult if any change in status.    Orinda Kenner, [place], CCC-SLP Watson,Katherine 06/14/2017, 12:25 PM

## 2017-06-14 NOTE — Assessment & Plan Note (Signed)
Discussed with patient that he should go to the ER for further eval of these sxs despite being mostly back to baseline. Pt agreeable

## 2017-06-14 NOTE — Consult Note (Signed)
Referring Physician: Posey Pronto    Chief Complaint: Difficulty with speech  HPI: Kirk Jones is an 76 y.o. male who reports that he was speaking with the yard man at about 1000 and noted that he could understand what the man was saying but he could not get his words out.  He reports that these symptoms lasted for about an hour then resolved spontaneously but later in the day at about 1700 the symptoms recurred.  Family urged his presentation at that time.  Again his symptoms have resolved spontaneously.  Patient reports that currently he is at baseline.  Initial NIHSS of 2  Date last known well: Date: 06/13/2017 Time last known well: Time: 17:00 tPA Given: No: Resolution of symptoms  Past Medical History:  Diagnosis Date  . Aortic valve regurgitation   . Atrial fibrillation ([place])   . Barrett's esophagus   . COPD (chronic obstructive pulmonary disease) ([place])   . Diverticulosis   . Gout   . Hematuria   . History of prosthetic heart valve   . Hyperlipidemia   . Hypertension   . Nodular prostate with urinary obstruction   . Substance abuse    alcohol    Past Surgical History:  Procedure Laterality Date  . CARPAL TUNNEL RELEASE Right 2015  . CATARACT EXTRACTION Right 2017  . VASECTOMY      Family History  Problem Relation Age of Onset  . Heart attack Mother   . Diabetes Maternal Grandmother   . Cancer Sister        lung   Social History:  reports that he quit smoking about 26 years ago. His smoking use included Cigarettes. He has never used smokeless tobacco. He reports that he drinks alcohol. He reports that he does not use drugs.  Allergies:  Allergies  Allergen Reactions  . Prednisone Other (See Comments)    Reaction: "organs shutting down"    Medications:  I have reviewed the patient's current medications. Prior to Admission:  Prescriptions Prior to Admission  Medication Sig Dispense Refill Last Dose  . acetaminophen (TYLENOL) 500 MG tablet Take 500 mg by  mouth 2 (two) times daily.    06/13/2017 at Unknown time  . aspirin EC 81 MG tablet Take 81 mg by mouth daily.   06/13/2017 at Unknown time  . cephALEXin (KEFLEX) 500 MG capsule Take 1 capsule (500 mg total) by mouth 3 (three) times daily. 21 capsule 0 06/13/2017 at Unknown time  . colchicine 0.6 MG tablet Take 1 tablet (0.6 mg total) by mouth daily. (Patient taking differently: Take 0.6 mg by mouth daily as needed (gout). ) 30 tablet 1 prn at prn  . diltiazem (DILACOR XR) 180 MG 24 hr capsule TAKE 1 CAPSULE BY MOUTH ONCE DAILY 90 capsule 1 06/13/2017 at Unknown time  . febuxostat (ULORIC) 40 MG tablet Take 1 tablet (40 mg total) by mouth daily. 90 tablet 3 06/13/2017 at Unknown time  . ferrous sulfate 325 (65 FE) MG tablet Take 325 mg by mouth once a week. Patient takes on Wednesday   Past Week at Unknown time  . fexofenadine (ALLEGRA) 180 MG tablet Take 180 mg by mouth daily.   06/13/2017 at Unknown time  . fluticasone (FLONASE) 50 MCG/ACT nasal spray Place 2 sprays into both nostrils daily. (Patient taking differently: Place 1 spray into both nostrils daily. ) 16 g 12 06/13/2017 at Unknown time  . furosemide (LASIX) 40 MG tablet Take 40 mg by mouth daily.    06/13/2017 at  Unknown time  . ketoconazole (NIZORAL) 2 % shampoo Apply 1 application topically as needed for irritation.   prn at prn  . losartan (COZAAR) 25 MG tablet Take 1 tablet (25 mg total) by mouth daily. 90 tablet 4 06/13/2017 at Unknown time  . magnesium 30 MG tablet Take 30 mg by mouth daily.   06/13/2017 at Unknown time  . metoprolol succinate (TOPROL-XL) 50 MG 24 hr tablet Take 1 tablet (50 mg total) by mouth 2 (two) times daily. Take with or immediately following a meal. 180 tablet 4 06/13/2017 at 1900  . Multiple Vitamins-Minerals (CENTRUM ADULTS PO) Take 1 tablet by mouth daily.    06/13/2017 at Unknown time  . mupirocin ointment (BACTROBAN) 2 % Apply 1 application topically 2 (two) times daily. 22 g 0 06/13/2017 at Unknown time  .  omeprazole (PRILOSEC) 20 MG capsule Take 1 capsule (20 mg total) by mouth 2 (two) times daily before a meal. 180 capsule 4 06/13/2017 at Unknown time  . tiotropium (SPIRIVA) 18 MCG inhalation capsule Place 1 capsule (18 mcg total) into inhaler and inhale daily. (Patient taking differently: Place 18 mcg into inhaler and inhale daily as needed (chest congestion). ) 90 capsule 4 06/13/2017 at Unknown time  . warfarin (COUMADIN) 1 MG tablet Take 0.5 mg by mouth daily. Take with 3 mg tablet for a total of 3.5 mg   06/13/2017 at Unknown time  . warfarin (COUMADIN) 3 MG tablet Take 3 mg by mouth daily. Take with 0.5 mg tablet for a total of 3.5 mg   06/13/2017 at Unknown time   Scheduled: . aspirin EC  81 mg Oral Daily  . diltiazem  180 mg Oral Daily  . febuxostat  40 mg Oral Daily  . metoprolol succinate  50 mg Oral BID  . mupirocin cream   Topical Daily  . pantoprazole  40 mg Oral Daily  . tiotropium  18 mcg Inhalation Daily  . warfarin  4 mg Oral q1800    ROS: History obtained from the patient  General ROS: negative for - chills, fatigue, fever, night sweats, weight gain or weight loss Psychological ROS: negative for - behavioral disorder, hallucinations, memory difficulties, mood swings or suicidal ideation Ophthalmic ROS: negative for - blurry vision, double vision, eye pain or loss of vision ENT ROS: negative for - epistaxis, nasal discharge, oral lesions, sore throat, tinnitus or vertigo Allergy and Immunology ROS: negative for - hives or itchy/watery eyes Hematological and Lymphatic ROS: negative for - bleeding problems, bruising or swollen lymph nodes Endocrine ROS: negative for - galactorrhea, hair pattern changes, polydipsia/polyuria or temperature intolerance Respiratory ROS: negative for - cough, hemoptysis, shortness of breath or wheezing Cardiovascular ROS: negative for - chest pain, dyspnea on exertion, edema or irregular heartbeat Gastrointestinal ROS: negative for - abdominal pain,  diarrhea, hematemesis, nausea/vomiting or stool incontinence Genito-Urinary ROS: negative for - dysuria, hematuria, incontinence or urinary frequency/urgency Musculoskeletal ROS: left shoulder pain Neurological ROS: as noted in HPI Dermatological ROS: negative for rash and skin lesion changes  Physical Examination: Blood pressure 129/64, pulse (!) 41, temperature (!) 97.4 F (36.3 C), temperature source Oral, resp. rate 16, height 5\' 3"  (1.6 m), weight 68 kg (150 lb), SpO2 97 %.  HEENT-  Normocephalic, no lesions, without obvious abnormality.  Normal external eye and conjunctiva.  Normal TM's bilaterally.  Normal auditory canals and external ears. Normal external nose, mucus membranes and septum.  Normal pharynx. Cardiovascular- S1, S2 normal, pulses palpable throughout   Lungs- chest  clear, no wheezing, rales, normal symmetric air entry Abdomen- soft, non-tender; bowel sounds normal; no masses,  no organomegaly Extremities- no edema Lymph-no adenopathy palpable Musculoskeletal-no joint tenderness, deformity or swelling Skin-bruising left side of the body  Neurological Examination   Mental Status: Alert, oriented, thought content appropriate.  Speech fluent without evidence of aphasia.  Able to follow 3 step commands without difficulty. Cranial Nerves: II: Discs flat bilaterally; Visual fields grossly normal, pupils equal, round, reactive to light and accommodation III,IV, VI: ptosis not present, extra-ocular motions intact bilaterally but asynchronous V,VII: smile symmetric, facial light touch sensation normal bilaterally VIII: hearing normal bilaterally IX,X: gag reflex present XI: bilateral shoulder shrug XII: midline tongue extension Motor: Right : Upper extremity   5/5    Left:     Upper extremity   5/5 limited due to pain  Lower extremity   5/5     Lower extremity   5/5 Tone and bulk:normal tone throughout; no atrophy noted Sensory: Pinprick and light touch intact throughout,  bilaterally Deep Tendon Reflexes: 1+ and symmetric with absent  AJ's bilaterally Plantars: Right: upgoing   Left: mute Cerebellar: Normal finger-to-nose and normal heel-to-shin testing bilaterally Gait: not tested due to safety concerns   Laboratory Studies:  Basic Metabolic Panel:  Recent Labs Lab 06/13/17 1950  NA 137  K 4.1  CL 99*  CO2 27  GLUCOSE 128*  BUN 20  CREATININE 1.23  CALCIUM 9.6    Liver Function Tests:  Recent Labs Lab 06/13/17 1950  AST 37  ALT 15*  ALKPHOS 62  BILITOT 1.1  PROT 7.6  ALBUMIN 4.2   No results for input(s): LIPASE, AMYLASE in the last 168 hours. No results for input(s): AMMONIA in the last 168 hours.  CBC:  Recent Labs Lab 06/13/17 1950  WBC 6.4  NEUTROABS 4.4  HGB 14.1  HCT 40.7  MCV 102.3*  PLT 247    Cardiac Enzymes:  Recent Labs Lab 06/13/17 1950  TROPONINI 0.03*    BNP: Invalid input(s): POCBNP  CBG: No results for input(s): GLUCAP in the last 168 hours.  Microbiology: Results for orders placed or performed in visit on 10/11/16  Microscopic Examination     Status: Abnormal   Collection Time: 10/11/16 10:50 AM  Result Value Ref Range Status   WBC, UA 0-5 0 - 5 /hpf Final   RBC, UA 3-10 (A) 0 - 2 /hpf Final   Epithelial Cells (non renal) CANCELED      Comment: Test not performed  Result canceled by the ancillary    Bacteria, UA Few (A) None seen/Few Final    Coagulation Studies:  Recent Labs  06/13/17 1950 06/14/17 0453  LABPROT 17.5* 17.9*  INR 1.42 1.46    Urinalysis: No results for input(s): COLORURINE, LABSPEC, PHURINE, GLUCOSEU, HGBUR, BILIRUBINUR, KETONESUR, PROTEINUR, UROBILINOGEN, NITRITE, LEUKOCYTESUR in the last 168 hours.  Invalid input(s): APPERANCEUR  Lipid Panel:    Component Value Date/Time   CHOL 189 06/14/2017 0453   CHOL 228 (H) 10/11/2016 1051   CHOL 145 11/17/2014 0422   TRIG 77 06/14/2017 0453   TRIG 119 11/17/2014 0422   HDL 75 06/14/2017 0453   HDL 116  10/11/2016 1051   HDL 33 (L) 11/17/2014 0422   CHOLHDL 2.5 06/14/2017 0453   VLDL 15 06/14/2017 0453   VLDL 24 11/17/2014 0422   LDLCALC 99 06/14/2017 0453   LDLCALC 97 10/11/2016 1051   LDLCALC 88 11/17/2014 0422    HgbA1C: No results found for: HGBA1C  Urine Drug Screen:  No results found for: LABOPIA, COCAINSCRNUR, LABBENZ, AMPHETMU, THCU, LABBARB  Alcohol Level: No results for input(s): ETH in the last 168 hours.  Other results: EKG: atrial fibrillation, rate 93bpm.  Imaging: US Carotid Bilateral (at [facility] Only)  Result Date: 06/14/2017 CLINICAL DATA:  Aphasia. EXAM: BILATERAL CAROTID DUPLEX ULTRASOUND TECHNIQUE: Pearline Cables scale imaging, color Doppler and duplex ultrasound were performed of bilateral carotid and vertebral arteries in the neck. COMPARISON:  CT 06/13/2017. FINDINGS: Criteria: Quantification of carotid stenosis is based on velocity parameters that correlate the residual internal carotid diameter with NASCET-based stenosis levels, using the diameter of the distal internal carotid lumen as the denominator for stenosis measurement. The following velocity measurements were obtained: RIGHT ICA:  80.6/24.5 cm/sec CCA:  44.1/ 9.0 cm/sec SYSTOLIC ICA/CCA RATIO:  1.8 DIASTOLIC ICA/CCA RATIO:  2.7 ECA:  103.4 cm/sec LEFT ICA:  6.2/ 16.8 cm/sec CCA:  36.6/4.4 cm/sec SYSTOLIC ICA/CCA RATIO:  1.7 DIASTOLIC ICA/CCA RATIO:  2.5 ECA:  75.6 cm/sec RIGHT CAROTID ARTERY: Diffuse mild right carotid atherosclerotic vascular disease with mild to moderate plaque right carotid bifurcation proximal ICA. Degree of stenosis less than 50%. RIGHT VERTEBRAL ARTERY:  Patent with antegrade flow. LEFT CAROTID ARTERY: Left carotid bifurcation and proximal ICA mild to moderate atherosclerotic vascular plaque. Degree of stenosis less than 50%. LEFT VERTEBRAL ARTERY:  Patent with antegrade flow. IMPRESSION: 1. Bilateral carotid atherosclerotic vascular disease with degree of stenosis less than 50% bilaterally. 2.  Vertebrals are patent with antegrade flow bilaterally . Electronically Signed   By: Marcello Moores  Register   On: 06/14/2017 10:41   Ct Head Code Stroke W/o Cm  Result Date: 06/13/2017 CLINICAL DATA:  Code stroke.  Slurred speech and confusion EXAM: CT HEAD WITHOUT CONTRAST TECHNIQUE: Contiguous axial images were obtained from the base of the skull through the vertex without intravenous contrast. COMPARISON:  Head CT 02/14/2015 FINDINGS: Brain: No mass lesion or acute hemorrhage. No focal hypoattenuation of the basal ganglia or cortex to indicate infarcted tissue. No hydrocephalus or age advanced atrophy. There is periventricular hypoattenuation compatible with chronic microvascular disease. Vascular: No hyperdense vessel. Atherosclerotic calcification of the internal carotid and vertebral arteries at the skull base. Skull: Normal visualized skull base, calvarium and extracranial soft tissues. Sinuses/Orbits: No sinus fluid levels or advanced mucosal thickening. No mastoid effusion. Normal orbits. ASPECTS [facility] Stroke Program Early CT Score) - Ganglionic level infarction (caudate, lentiform nuclei, internal capsule, insula, M1-M3 cortex): 7 - Supraganglionic infarction (M4-M6 cortex): 3 Total score (0-10 with 10 being normal): 10 IMPRESSION: 1. No hemorrhage or mass lesion. 2. ASPECTS is 10. These results were called by telephone at the time of interpretation on 06/13/2017 at 7:50 pm to Dr. Mable Paris, who verbally acknowledged these results. Electronically Signed   By: Ulyses Jarred M.D.   On: 06/13/2017 19:50    Assessment: 76 y.o. male presenting after two episodes of expressive aphasia likely representing TIA.  Has a history of atrial fibrillation and s/p valve replacement on ASA and Coumadin.   Initial INR of 1.42.  Head CT reviewed and shows no acute changes.  Patient unable to have MRI due to pacer.  Carotid dopplers show no evidence of hemodynamically significant stenosis.  Echocardiogram pending.  A1c  pending, LDL 99.  Stroke Risk Factors - atrial fibrillation, hyperlipidemia and hypertension  Plan: 1. PT consult, OT consult, Speech consult 2. Prophylactic therapy-Continue ASA and Coumadin with target INR between 2.5 and 3.5.   3. Frequent neuro checks 4. Statin for  lipid management with target LDL<70.    Alexis Goodell, MD Neurology [phone] 06/14/2017, 8:59 PM

## 2017-06-14 NOTE — Progress Notes (Signed)
Dr Posey Pronto notified that patient had 12 beat run of Vtach. MD acknowledged, no new orders given.

## 2017-06-14 NOTE — Progress Notes (Signed)
[place] [facility] [place] NAME: Kirk Jones    MR#:  [id number]  DATE OF BIRTH:  August 21, 1941  SUBJECTIVE:  Came in with difficulty producing words No issues today   REVIEW OF SYSTEMS:   Review of Systems  Constitutional: Negative for chills, fever and weight loss.  HENT: Negative for ear discharge, ear pain and nosebleeds.   Eyes: Negative for blurred vision, pain and discharge.  Respiratory: Negative for sputum production, shortness of breath, wheezing and stridor.   Cardiovascular: Negative for chest pain, palpitations, orthopnea and PND.  Gastrointestinal: Negative for abdominal pain, diarrhea, nausea and vomiting.  Genitourinary: Negative for frequency and urgency.  Musculoskeletal: Negative for back pain and joint pain.  Neurological: Positive for weakness. Negative for sensory change, speech change and focal weakness.  Psychiatric/Behavioral: Negative for depression and hallucinations. The patient is not nervous/anxious.    Tolerating Diet:yes Tolerating PT: out pt PT  DRUG ALLERGIES:   Allergies  Allergen Reactions  . Prednisone Other (See Comments)    Reaction: "organs shutting down"    VITALS:  Blood pressure (!) 148/74, pulse 94, temperature 98.5 F (36.9 C), temperature source Oral, resp. rate 16, height 5\' 3"  (1.6 m), weight 68 kg (150 lb), SpO2 98 %.  PHYSICAL EXAMINATION:   Physical Exam  GENERAL:  76 y.o.-year-old patient lying in the bed with no acute distress.  EYES: Pupils equal, round, reactive to light and accommodation. No scleral icterus. Extraocular muscles intact.  HEENT: Head atraumatic, normocephalic. Oropharynx and nasopharynx clear.  NECK:  Supple, no jugular venous distention. No thyroid enlargement, no tenderness.  LUNGS: Normal breath sounds bilaterally, no wheezing, rales, rhonchi. No use of accessory muscles of respiration.  CARDIOVASCULAR: S1, S2 normal. No murmurs, rubs, or gallops.   ABDOMEN: Soft, nontender, nondistended. Bowel sounds present. No organomegaly or mass.  EXTREMITIES: No cyanosis, clubbing or edema b/l.    NEUROLOGIC: Cranial nerves II through XII are intact. No focal Motor or sensory deficits b/l.   PSYCHIATRIC:  patient is alert and oriented x 3.  SKIN: No obvious rash, lesion, or ulcer.   LABORATORY PANEL:  CBC  Recent Labs Lab 06/13/17 1950  WBC 6.4  HGB 14.1  HCT 40.7  PLT 247    Chemistries   Recent Labs Lab 06/13/17 1950  NA 137  K 4.1  CL 99*  CO2 27  GLUCOSE 128*  BUN 20  CREATININE 1.23  CALCIUM 9.6  AST 37  ALT 15*  ALKPHOS 62  BILITOT 1.1   Cardiac Enzymes  Recent Labs Lab 06/13/17 1950  TROPONINI 0.03*   RADIOLOGY:  US Carotid Bilateral (at [facility] And [facility] Only)  Result Date: 06/14/2017 CLINICAL DATA:  Aphasia. EXAM: BILATERAL CAROTID DUPLEX ULTRASOUND TECHNIQUE: Pearline Cables scale imaging, color Doppler and duplex ultrasound were performed of bilateral carotid and vertebral arteries in the neck. COMPARISON:  CT 06/13/2017. FINDINGS: Criteria: Quantification of carotid stenosis is based on velocity parameters that correlate the residual internal carotid diameter with NASCET-based stenosis levels, using the diameter of the distal internal carotid lumen as the denominator for stenosis measurement. The following velocity measurements were obtained: RIGHT ICA:  80.6/24.5 cm/sec CCA:  44.1/ 9.0 cm/sec SYSTOLIC ICA/CCA RATIO:  1.8 DIASTOLIC ICA/CCA RATIO:  2.7 ECA:  103.4 cm/sec LEFT ICA:  6.2/ 16.8 cm/sec CCA:  99.3/7.1 cm/sec SYSTOLIC ICA/CCA RATIO:  1.7 DIASTOLIC ICA/CCA RATIO:  2.5 ECA:  75.6 cm/sec RIGHT CAROTID ARTERY: Diffuse mild right carotid atherosclerotic vascular disease with mild  to moderate plaque right carotid bifurcation proximal ICA. Degree of stenosis less than 50%. RIGHT VERTEBRAL ARTERY:  Patent with antegrade flow. LEFT CAROTID ARTERY: Left carotid bifurcation and proximal ICA mild to moderate atherosclerotic  vascular plaque. Degree of stenosis less than 50%. LEFT VERTEBRAL ARTERY:  Patent with antegrade flow. IMPRESSION: 1. Bilateral carotid atherosclerotic vascular disease with degree of stenosis less than 50% bilaterally. 2. Vertebrals are patent with antegrade flow bilaterally . Electronically Signed   By: Marcello Moores  Register   On: 06/14/2017 10:41   Ct Head Code Stroke W/o Cm  Result Date: 06/13/2017 CLINICAL DATA:  Code stroke.  Slurred speech and confusion EXAM: CT HEAD WITHOUT CONTRAST TECHNIQUE: Contiguous axial images were obtained from the base of the skull through the vertex without intravenous contrast. COMPARISON:  Head CT 02/14/2015 FINDINGS: Brain: No mass lesion or acute hemorrhage. No focal hypoattenuation of the basal ganglia or cortex to indicate infarcted tissue. No hydrocephalus or age advanced atrophy. There is periventricular hypoattenuation compatible with chronic microvascular disease. Vascular: No hyperdense vessel. Atherosclerotic calcification of the internal carotid and vertebral arteries at the skull base. Skull: Normal visualized skull base, calvarium and extracranial soft tissues. Sinuses/Orbits: No sinus fluid levels or advanced mucosal thickening. No mastoid effusion. Normal orbits. ASPECTS [facility] Stroke Program Early CT Score) - Ganglionic level infarction (caudate, lentiform nuclei, internal capsule, insula, M1-M3 cortex): 7 - Supraganglionic infarction (M4-M6 cortex): 3 Total score (0-10 with 10 being normal): 10 IMPRESSION: 1. No hemorrhage or mass lesion. 2. ASPECTS is 10. These results were called by telephone at the time of interpretation on 06/13/2017 at 7:50 pm to Dr. Mable Paris, who verbally acknowledged these results. Electronically Signed   By: Ulyses Jarred M.D.   On: 06/13/2017 19:50   ASSESSMENT AND PLAN:  Kirk Jones  is a 76 y.o. male who presents with Expressive aphasia. Patient states that he first noticed the symptoms about 10:00 in the morning, when he was  talking to a gentleman who tends his lawn and found that he had difficulty finding the words he wanted to say. He had no difficulty understanding other people when they spoke. Throughout the day he had some recurrence of this, and tonight came be for evaluation.  * Expressive aphasia - patient is currently not demonstrating the symptom.  -no neuro deficits. Pt's coumadin was on hold for 2 days after a laceration on his arm with bleeding. His INR was subtherapeutic when he was admitted with 1.42---Heparin gtt and coumadin---1.46 -CT head negatvie.  -MRI unable to do due to pacemaker. -Seen by Dr reynolds---recommends above rx  *  Atrial fibrillation ([place]) - continue home meds including anticoagulation  *  Hypertension - continue home medications   * COPD (chronic obstructive pulmonary disase) ([facility]) - home dose inhalers   * CAD (coronary artery disease) - continue home meds    CKD (chronic kidney disease), stage III - avoid nephrotoxins and monitor  Case discussed with Care Management/Social Worker. Management plans discussed with the patient, family and they are in agreement.  CODE STATUS: Full  DVT Prophylaxis: coumadin + heparin gtt  TOTAL TIME TAKING CARE OF THIS PATIENT: 30 minutes.  >50% time spent on counselling and coordination of care pt and wife in the room  POSSIBLE D/C IN 1-2 DAYS, DEPENDING ON CLINICAL CONDITION.  Note: This dictation was prepared with Dragon dictation along with smaller phrase technology. Any transcriptional errors that result from this process are unintentional.  Mylin Hirano M.D on 06/14/2017 at 6:21 PM  Between 7am to 6pm - Pager - [phone]  After 6pm go to [email or web address] - password [email or web address] [facility]  Office  [phone]  CC: Primary care physician; Guadalupe Maple, MD

## 2017-06-14 NOTE — Care Management Note (Signed)
Case Management Note  Patient Details  Name: Kirk Jones MRN: [id number] Date of Birth: August 09, 1941  Subjective/Objective:                 Spoke to patient and wife at bedside. Patient from home with wife, admitted w CVA, no HH needs anticipated. Patient has rollator, RW, 2 canes available at home for use. Patient on coumadin pta.    Action/Plan:  CM will continue to follow.   Expected Discharge Date:                  Expected Discharge Plan:  Home/Self Care  In-House Referral:     Discharge planning Services  CM Consult  Post Acute Care Choice:    Choice offered to:     DME Arranged:    DME Agency:     HH Arranged:    HH Agency:     Status of Service:  In process, will continue to follow  If discussed at Long Length of Stay Meetings, dates discussed:    Additional Comments:  Carles Collet, RN 06/14/2017, 3:26 PM

## 2017-06-14 NOTE — Progress Notes (Signed)
OT Cancellation Note  Patient Details Name: Kirk Jones MRN: [id number] DOB: [birth year]   Cancelled Treatment:    Reason Eval/Treat Not Completed: Patient at procedure or test/ unavailable. Order received, chart reviewed. Pt out of room for testing. Will re-attempt OT evaluation at later time as pt is available.  Jeni Salles, MPH, MS, OTR/L ascom [phone] 06/14/17, 9:34 AM

## 2017-06-14 NOTE — Consult Note (Signed)
[facility] Nurse wound consult note Reason for Consult:Patient had fall over a week ago and requires wound care to left forearm, left knee and right forearm.  Left forearm is nonapproximated and MD has placed a skin substitute per patient.   Wound type: trauma wounds, on blood thinners Pressure Injury POA: NA Measurement: Left forearm 8 cm x 4.3 cm ruddy red wound bed, skin substiture per patient.   Patient indicates that wound care to left knee and right arm should not be performed until Thursday.  Dressings left in place.  Wound XQK:SKSHN red, left arm Drainage (amount, consistency, odor) Minimal serosanguinous Periwound:Ecchymosis, thin skin Dressing procedure/placement/frequency:Cleanse wound to left forearm with NS and pat gently dry.  Apply Bactroban to wound bed.  Cover with nonadherent telfa pad.  Cover with kerilx and tape.  Change daily.  Will not follow at this time.  Please re-consult if needed.  Domenic Moras RN BSN [place] Pager [phone]

## 2017-06-14 NOTE — Progress Notes (Signed)
ANTICOAGULATION CONSULT NOTE - Follow UP   Pharmacy Consult for heparin Indication: atrial fibrillation  Allergies  Allergen Reactions  . Prednisone Other (See Comments)    Reaction: "organs shutting down"    Patient Measurements: Height: 5\' 3"  (160 cm) Weight: 150 lb (68 kg) IBW/kg (Calculated) : 56.9 Heparin Dosing Weight: 68 kg  Vital Signs: Temp: 98.2 F (36.8 C) (07/17 2200) Temp Source: Oral (07/17 2200) BP: 117/57 (07/17 2200) Pulse Rate: 81 (07/17 2200)  Labs:  Recent Labs  06/13/17 1950 06/14/17 0453 06/14/17 1117 06/14/17 2011  HGB 14.1  --   --   --   HCT 40.7  --   --   --   PLT 247  --   --   --   APTT 29  --   --   --   LABPROT 17.5* 17.9*  --   --   INR 1.42 1.46  --   --   HEPARINUNFRC  --   --  0.42 0.17*  CREATININE 1.23  --   --   --   TROPONINI 0.03*  --   --   --     Estimated Creatinine Clearance: 41.1 mL/min (by C-G formula based on SCr of 1.23 mg/dL).   Medical History: Past Medical History:  Diagnosis Date  . Aortic valve regurgitation   . Atrial fibrillation ([place])   . Barrett's esophagus   . COPD (chronic obstructive pulmonary disease) ([place])   . Diverticulosis   . Gout   . Hematuria   . History of prosthetic heart valve   . Hyperlipidemia   . Hypertension   . Nodular prostate with urinary obstruction   . Substance abuse    alcohol    Medications:  Scheduled:  . aspirin EC  81 mg Oral Daily  . diltiazem  180 mg Oral Daily  . febuxostat  40 mg Oral Daily  . heparin  2,050 Units Intravenous Once  . metoprolol succinate  50 mg Oral BID  . mupirocin cream   Topical Daily  . pantoprazole  40 mg Oral Daily  . tiotropium  18 mcg Inhalation Daily  . warfarin  4 mg Oral q1800    Assessment: Patient admitted for slurred speech and is anticoagulated w/ warfarin. Is being started on heparin drip INR currently subtherapeutic at 1.42 Warfarin home dose of 3.5 mg daily  INR 7/16 1.42  Goal of Therapy:  Heparin level  0.3-0.7 units/ml Monitor platelets by anticoagulation protocol: Yes   Plan:  1st Heparin level within goal range. Will continue current rate. Will recheck heparin level @1930 .   7/17 @ 2000 HL 0.17 subtherapeutic. Will rebolus w/ heparin 2050 units IV x 1 and increase rate to 950 units/hr and recheck HL @ 0700.  Tobie Lords, PharmD, BCPS Clinical Pharmacist 06/14/2017

## 2017-06-14 NOTE — Evaluation (Signed)
Physical Therapy Evaluation Patient Details Name: Kirk Jones MRN: [id number] DOB: 12-Feb-1941 Today's Date: 06/14/2017   History of Present Illness  76 y.o.malewho presented to ED on 7/16 with expressive aphasia and dizziness. Head CT negative for hemorrhage or mass lesion.  Admitted for further work up. PMHx includes aortic valve regurgitation, A-fib, Barrett's esophagus, COPD, diverticulosis, gout, hematuria, hx prosthetic heart valve, HLD, HTN, nodular prostate w/ urinary obstruction, hx substance abuse (alcohol). Pt has pacemaker.    Clinical Impression  Pt admitted with above diagnosis. Pt currently with functional limitations due to the deficits listed below (see PT Problem List). Pt presents with BLE strength, coordination, sensation all WNL.  Pt scored a 42/56 on the [employer] and an 11/24 on the DGI, both indicating that pt is at an increased risk of falling.  Results of outcome measures explained to the patient and recommending OPPT to address these impairments. Pt will benefit from skilled PT to increase their independence and safety with mobility to allow discharge to the venue listed below.      Follow Up Recommendations Outpatient PT    Equipment Recommendations  None recommended by PT    Recommendations for Other Services       Precautions / Restrictions Precautions Precautions: None Restrictions Weight Bearing Restrictions: No      Mobility  Bed Mobility Overal bed mobility: Independent             General bed mobility comments: No physical assist or cues needed.  Pt performs independently.  Transfers Overall transfer level: Needs assistance Equipment used: None Transfers: Sit to/from Stand Sit to Stand: Supervision         General transfer comment: Supervision for safety as pt demonstrates mild instability with sit<>stand.    Ambulation/Gait Ambulation/Gait assistance: Min guard;Supervision Ambulation Distance (Feet): 200  Feet Assistive device: None Gait Pattern/deviations: Decreased weight shift to right;Trunk flexed Gait velocity: decreased Gait velocity interpretation: Below normal speed for age/gender General Gait Details: Pt usually wears built up shoe due to LLD and therefore demonstrates dec weight shift to the RLE when ambulating.  Min guard provided due to mild instability noted.  Pt is unable to attempt higher level balance activities when ambulating due to fear of falling (see DGI below).    Stairs Stairs: Yes Stairs assistance: Min guard Stair Management: One rail Right;Two rails;Step to pattern;Forwards Number of Stairs: 5 General stair comments: Pt holds onto R railing during ascent (as he does at home) and Bil rails during descent.  No signs of instability but min guard provided for pt's safety.  Wheelchair Mobility    Modified Rankin (Stroke Patients Only)       Balance Overall balance assessment: Needs assistance Sitting-balance support: No upper extremity supported;Feet supported Sitting balance-Leahy Scale: Good     Standing balance support: No upper extremity supported;During functional activity Standing balance-Leahy Scale: Fair Standing balance comment: Pt able to perform static and dynamic activities without UE support but would likely lose his balance with perturbations.                 Standardized Balance Assessment Standardized Balance Assessment : Dynamic Gait Index;Berg Balance Test Berg Balance Test Sit to Stand: Able to stand  independently using hands Standing Unsupported: Able to stand safely 2 minutes Sitting with Back Unsupported but Feet Supported on Floor or Stool: Able to sit safely and securely 2 minutes Stand to Sit: Sits safely with minimal use of hands Transfers: Able to transfer safely, minor  use of hands Standing Unsupported with Eyes Closed: Able to stand 10 seconds with supervision Standing Ubsupported with Feet Together: Able to place feet  together independently and stand 1 minute safely From Standing, Reach Forward with Outstretched Arm: Can reach forward >12 cm safely (5") From Standing Position, Pick up Object from Floor: Able to pick up shoe, needs supervision From Standing Position, Turn to Look Behind Over each Shoulder: Looks behind one side only/other side shows less weight shift Turn 360 Degrees: Able to turn 360 degrees safely but slowly Standing Unsupported, Alternately Place Feet on Step/Stool: Able to stand independently and complete 8 steps >20 seconds Standing Unsupported, One Foot in Front: Able to take small step independently and hold 30 seconds Standing on One Leg: Unable to try or needs assist to prevent fall Total Score: 42 Dynamic Gait Index Level Surface: Mild Impairment Change in Gait Speed: Severe Impairment (unable) Gait with Horizontal Head Turns: Severe Impairment (unable) Gait with Vertical Head Turns: Severe Impairment (unable) Gait and Pivot Turn: Mild Impairment Step Over Obstacle: Normal Step Around Obstacles: Normal Steps: Moderate Impairment Total Score: 11       Pertinent Vitals/Pain Pain Assessment: No/denies pain    Home Living Family/patient expects to be discharged to:: Private residence Living Arrangements: Spouse/significant other Available Help at Discharge: Family;Available 24 hours/day Type of Home: House Home Access: Stairs to enter Entrance Stairs-Rails: Chemical engineer of Steps: 4 Home Layout: Two level;Able to live on main level with bedroom/bathroom Home Equipment: Grab bars - tub/shower;Cane - single point Additional Comments: spouse has 2WW and rollator    Prior Function Level of Independence: Independent         Comments: indep with ADL, IADL including driving, PRN use of SPC when having a gout flare up. Endorses 3 falls in past 12 months due to dizziness upon standing (had increase in BP med dose which has since been adjusted) and another  fall due to low lighting.      Hand Dominance   Dominant Hand: Right    Extremity/Trunk Assessment   Upper Extremity Assessment Upper Extremity Assessment: Defer to OT evaluation    Lower Extremity Assessment Lower Extremity Assessment: Overall WFL for tasks assessed (bandage on L knee where pt recently fell)    Cervical / Trunk Assessment Cervical / Trunk Assessment: Normal  Communication   Communication: No difficulties  Cognition Arousal/Alertness: Awake/alert Behavior During Therapy: WFL for tasks assessed/performed Overall Cognitive Status: Within Functional Limits for tasks assessed                                 General Comments: alert and oriented, follows all commands, no word finding difficulties       General Comments General comments (skin integrity, edema, etc.): Pt scored a 42/56 on the [employer] and an 11/24 on the DGI, both indicating that pt is at an increased risk of falling.  Results of outcome measures explained to the patient and recommending OPPT to address these impairments.    Exercises Other Exercises Other Exercises: Reviewed signs and symptoms of a stroke as pt only able to report slurred speech and facial droop with the pt.  Additionally reviewed the importance of calling 911 immediately if any of the symptoms/signs are noted.  Pt verbalized understanding.   Assessment/Plan    PT Assessment Patient needs continued PT services  PT Problem List Decreased balance;Decreased safety awareness  PT Treatment Interventions DME instruction;Gait training;Stair training;Functional mobility training;Therapeutic activities;Therapeutic exercise;Balance training;Neuromuscular re-education;Patient/family education    PT Goals (Current goals can be found in the Care Plan section)  Acute Rehab PT Goals Patient Stated Goal: to go home PT Goal Formulation: With patient Time For Goal Achievement: 06/28/17 Potential to Achieve Goals:  Good    Frequency 7X/week   Barriers to discharge        Co-evaluation               AM-PAC PT "6 Clicks" Daily Activity  Outcome Measure Difficulty turning over in bed (including adjusting bedclothes, sheets and blankets)?: None Difficulty moving from lying on back to sitting on the side of the bed? : None Difficulty sitting down on and standing up from a chair with arms (e.g., wheelchair, bedside commode, etc,.)?: A Little Help needed moving to and from a bed to chair (including a wheelchair)?: A Little Help needed walking in hospital room?: A Little Help needed climbing 3-5 steps with a railing? : A Little 6 Click Score: 20    End of Session Equipment Utilized During Treatment: Gait belt Activity Tolerance: Patient tolerated treatment well Patient left: in bed;with call bell/phone within reach;with bed alarm set;Other (comment) (with lab staff at pt's bedside) Nurse Communication: Mobility status;Other (comment) (OPPT recommendation) PT Visit Diagnosis: Unsteadiness on feet (R26.81);Other abnormalities of gait and mobility (R26.89)    Time: 5329-9242 PT Time Calculation (min) (ACUTE ONLY): 25 min   Charges:   PT Evaluation $PT Eval Low Complexity: 1 Procedure PT Treatments $Gait Training: 8-22 mins   PT G Codes:        Collie Siad PT, DPT 06/14/2017, 1:23 PM

## 2017-06-14 NOTE — Addendum Note (Signed)
Addended by: Merrie Roof E on: 06/14/2017 08:21 AM   Modules accepted: Level of Service

## 2017-06-14 NOTE — Progress Notes (Signed)
*  PRELIMINARY RESULTS* Echocardiogram 2D Echocardiogram has been performed.  Kirk Jones 06/14/2017, 2:39 PM

## 2017-06-14 NOTE — Evaluation (Signed)
Occupational Therapy Evaluation Patient Details Name: Kirk Jones MRN: [id number] DOB: Nov 05, 1941 Today's Date: 06/14/2017    History of Present Illness 76 y.o.malewho presented to ED on 7/16 with expressive aphasia and dizziness. Admitted for further work up. PMHx includes aortic valve regurgitation, A-fib, Barrett's esophagus, COPD, diverticulosis, gout, hematuria, hx prosthetic heart valve, HLD, HTN, nodular prostate w/ urinary obstruction, hx substance abuse (alcohol). Pt has pacemaker.   Clinical Impression   Pt seen for OT evaluation this date. Pt was independent at baseline, occasionally using SPC for ambulation when he experienced a gout flare up. Pt presents with the expressive aphasia completely resolved and no additional functional deficits noted based on clinical observation, pt/spouse report, and RN report. Pt endorses 3 falls in past 12 months. One occurring after becoming dizzy (had recently had a change in BP medication, which has since been addressed) and another fall occurring after tripping over something in a dark garage (he had not turned the lights on). Pt educated in falls prevention strategies including OH/BP, low lighting, and home/routines modifications to minimize the risk of future falls and risk of injury and rehospitalization. Pt/spouse verbalized understanding. No additional OT needs at this time. Will sign off. Please re-consult if additional needs arise.     Follow Up Recommendations  No OT follow up    Equipment Recommendations  None recommended by OT    Recommendations for Other Services       Precautions / Restrictions Precautions Precautions: None Restrictions Weight Bearing Restrictions: No      Mobility Bed Mobility                  Transfers                      Balance                                           ADL either performed or assessed with clinical judgement   ADL Overall ADL's : At  baseline                                             Vision Baseline Vision/History: Wears glasses (hx of divergent strabismus) Wears Glasses: At all times Patient Visual Report: No change from baseline Vision Assessment?: No apparent visual deficits     Perception     Praxis      Pertinent Vitals/Pain Pain Assessment: No/denies pain     Hand Dominance     Extremity/Trunk Assessment Upper Extremity Assessment Upper Extremity Assessment: Overall WFL for tasks assessed   Lower Extremity Assessment Lower Extremity Assessment: Overall WFL for tasks assessed   Cervical / Trunk Assessment Cervical / Trunk Assessment: Normal   Communication Communication Communication: No difficulties   Cognition Arousal/Alertness: Awake/alert Behavior During Therapy: WFL for tasks assessed/performed Overall Cognitive Status: Within Functional Limits for tasks assessed                                 General Comments: alert and oriented, follows all commands, no word finding difficulties    General Comments       Exercises Other Exercises Other Exercises: pt educated in falls prevention strategies including  OH, lighting, and home/routines modifications to maximize safety and minimize risk of future falls/injury/rehospitalization. Pt/spouse verbalized understanding.   Shoulder Instructions      Home Living Family/patient expects to be discharged to:: Private residence Living Arrangements: Spouse/significant other Available Help at Discharge: Family;Available 24 hours/day Type of Home: House Home Access: Stairs to enter [employer] of Steps: 4 Entrance Stairs-Rails: Left;Right Home Layout: Two level;Able to live on main level with bedroom/bathroom     Bathroom Shower/Tub: Occupational psychologist: Handicapped height     Home Equipment: [place];Cane - single point   Additional Comments: spouse has 2WW and  rollator      Prior Functioning/Environment Level of Independence: Independent        Comments: indep with ADL, IADL including driving, PRN use of SPC when having a gout flare up. Endorses 3 falls in past 12 months due to dizziness upon standing (had increase in BP med dose which has since been adjusted) and another fall due to low lighting.         OT Problem List:        OT Treatment/Interventions:      OT Goals(Current goals can be found in the care plan section) Acute Rehab OT Goals Patient Stated Goal: go home OT Goal Formulation: All assessment and education complete, DC therapy  OT Frequency:     Barriers to D/C:            Co-evaluation              AM-PAC PT "6 Clicks" Daily Activity     Outcome Measure Help from another person eating meals?: None Help from another person taking care of personal grooming?: None Help from another person toileting, which includes using toliet, bedpan, or urinal?: None Help from another person bathing (including washing, rinsing, drying)?: None Help from another person to put on and taking off regular upper body clothing?: None Help from another person to put on and taking off regular lower body clothing?: None 6 Click Score: 24   End of Session    Activity Tolerance: Patient tolerated treatment well Patient left: in bed;with call bell/phone within reach;with bed alarm set;with family/visitor present  OT Visit Diagnosis: Cognitive communication deficit (R41.841) Symptoms and signs involving cognitive functions: Other cerebrovascular disease                Time: 1030-1053 OT Time Calculation (min): 23 min Charges:  OT General Charges $OT Visit: 1 Procedure OT Evaluation $OT Eval Low Complexity: 1 Procedure OT Treatments $Therapeutic Activity: 8-22 mins G-Codes: OT G-codes **NOT FOR INPATIENT CLASS** Functional Assessment Tool Used: AM-PAC 6 Clicks Daily Activity;Clinical judgement Functional Limitation: Self  care Self Care Current Status (G2542): 0 percent impaired, limited or restricted Self Care Goal Status (H0623): 0 percent impaired, limited or restricted Self Care Discharge Status (J6283): 0 percent impaired, limited or restricted   Jeni Salles, MPH, MS, OTR/L ascom [phone] 06/14/17, 11:17 AM

## 2017-06-15 ENCOUNTER — Telehealth: Payer: Self-pay | Admitting: Unknown Physician Specialty

## 2017-06-15 DIAGNOSIS — R4701 Aphasia: Secondary | ICD-10-CM | POA: Diagnosis not present

## 2017-06-15 DIAGNOSIS — I4891 Unspecified atrial fibrillation: Secondary | ICD-10-CM | POA: Diagnosis not present

## 2017-06-15 DIAGNOSIS — I10 Essential (primary) hypertension: Secondary | ICD-10-CM | POA: Diagnosis not present

## 2017-06-15 DIAGNOSIS — J449 Chronic obstructive pulmonary disease, unspecified: Secondary | ICD-10-CM | POA: Diagnosis not present

## 2017-06-15 LAB — PROTIME-INR
INR: 1.38
Prothrombin Time: 17.1 seconds — ABNORMAL HIGH (ref 11.4–15.2)

## 2017-06-15 LAB — HEPARIN LEVEL (UNFRACTIONATED): Heparin Unfractionated: 0.53 IU/mL (ref 0.30–0.70)

## 2017-06-15 LAB — HEMOGLOBIN A1C
Hgb A1c MFr Bld: 5.2 % (ref 4.8–5.6)
Mean Plasma Glucose: 103 mg/dL

## 2017-06-15 MED ORDER — ENOXAPARIN SODIUM 80 MG/0.8ML ~~LOC~~ SOLN
1.0000 mg/kg | Freq: Two times a day (BID) | SUBCUTANEOUS | Status: DC
  Administered 2017-06-15: 10:00:00 70 mg via SUBCUTANEOUS
  Filled 2017-06-15: qty 0.8

## 2017-06-15 MED ORDER — ENOXAPARIN SODIUM 80 MG/0.8ML ~~LOC~~ SOLN: 1.0000 mg/kg | Freq: Two times a day (BID) | SUBCUTANEOUS | 0 refills | Status: DC

## 2017-06-15 MED ORDER — WARFARIN SODIUM 4 MG PO TABS: 4.0000 mg | ORAL_TABLET | Freq: Every day | ORAL | 0 refills | Status: DC

## 2017-06-15 MED ORDER — ENOXAPARIN SODIUM 80 MG/0.8ML ~~LOC~~ SOLN: 1.0000 mg/kg | Freq: Two times a day (BID) | SUBCUTANEOUS | Status: DC

## 2017-06-15 NOTE — Care Management Obs Status (Signed)
[facility] NOTIFICATION   Patient Details  Name: Kirk Jones MRN: [id number] Date of Birth: 1940-12-01   Medicare Observation Status Notification Given:  Yes    Shelbie Ammons, RN 06/15/2017, 11:35 AM

## 2017-06-15 NOTE — Discharge Summary (Signed)
[facility] NAME: Kirk Jones    MR#:  [id number]  DATE OF BIRTH:  December 07, 1940  DATE OF ADMISSION:  06/13/2017 ADMITTING PHYSICIAN: Lance Coon, MD  DATE OF DISCHARGE: 06/15/17  PRIMARY CARE PHYSICIAN: Guadalupe Maple, MD    ADMISSION DIAGNOSIS:  Acute ischemic stroke ([place]) [I63.9]  DISCHARGE DIAGNOSIS:  Expressive aphasia suspected due to CVA. Unable to do MRI due to pacemaker. CT head negative Subtherapeutic INR currently on Lovenox and Coumadin until INR therapeutic between 2.5 and 3.5 Paroxysmal A. fib and history of St. Jude's mechanical aortic valve replacement--- on Coumadin  SECONDARY DIAGNOSIS:   Past Medical History:  Diagnosis Date  . Aortic valve regurgitation   . Atrial fibrillation ([place])   . Barrett's esophagus   . COPD (chronic obstructive pulmonary disease) ([place])   . Diverticulosis   . Gout   . Hematuria   . History of prosthetic heart valve   . Hyperlipidemia   . Hypertension   . Nodular prostate with urinary obstruction   . Substance abuse    alcohol    HOSPITAL COURSE:   HenryBayliffis a 76 y.o.malewho presents with Expressive aphasia. Patient states that he first noticed the symptoms about 10:00 in the morning, when he was talking to a gentleman who tends his lawn and found that he had difficulty finding the words he wanted to say. He had no difficulty understanding other people when they spoke. Throughout the day he had some recurrence of this, and tonight came be for evaluation.  * Expressive aphasia - patient is currently not demonstrating the symptom.  -no neuro deficits. Pt's coumadin was on hold for 2 days after a laceration on his arm with bleeding. His INR was subtherapeutic when he was admitted with 1.42---Heparin gtt and coumadin---1.46---1.38--now on Lovenox plus Coumadin---patient will get INR draw at primary care physician's office tomorrow and we will thereby managed by  PCP. Spoke with Rozann Lesches with PCPs office. -CT head negatvie.  -MRI unable to do due to pacemaker. -Seen by Dr reynolds---recommends above rx  *Atrial fibrillation ([place]) and history of mechanical St. Jude's aortic valve - continue home meds including anticoagulation -Patient currently on Lovenox plus Coumadin  *Hypertension - continue home medications  *COPD (chronic obstructive pulmonary disase) (HCC) - home dose inhalers  *CAD (coronary artery disease) - continue home meds  CKD (chronic kidney disease), stage III - avoid nephrotoxins and monitor  Patient doing well. Discharge to home.  Case discussed with Care Management/Social Worker. Management plans discussed with the patient, family and they are in agreement.  CODE STATUS: Full  DVT Prophylaxis: coumadin + heparin gtt CONSULTS OBTAINED:  Treatment Team:  Alexis Goodell, MD  DRUG ALLERGIES:   Allergies  Allergen Reactions  . Prednisone Other (See Comments)    Reaction: "organs shutting down"    DISCHARGE MEDICATIONS:   Current Discharge Medication List    START taking these medications   Details  enoxaparin (LOVENOX) 80 MG/0.8ML injection Inject 0.7 mLs (70 mg total) into the skin every 12 (twelve) hours. Qty: 8 Syringe, Refills: 0      CONTINUE these medications which have CHANGED   Details  warfarin (COUMADIN) 4 MG tablet Take 1 tablet (4 mg total) by mouth daily at 6 PM. Qty: 25 tablet, Refills: 0      CONTINUE these medications which have NOT CHANGED   Details  acetaminophen (TYLENOL) 500 MG tablet Take 500 mg by mouth 2 (two) times  daily.     aspirin EC 81 MG tablet Take 81 mg by mouth daily.    cephALEXin (KEFLEX) 500 MG capsule Take 1 capsule (500 mg total) by mouth 3 (three) times daily. Qty: 21 capsule, Refills: 0    colchicine 0.6 MG tablet Take 1 tablet (0.6 mg total) by mouth daily. Qty: 30 tablet, Refills: 1    diltiazem (DILACOR XR) 180 MG 24 hr capsule TAKE 1  CAPSULE BY MOUTH ONCE DAILY Qty: 90 capsule, Refills: 1   Associated Diagnoses: Essential hypertension    febuxostat (ULORIC) 40 MG tablet Take 1 tablet (40 mg total) by mouth daily. Qty: 90 tablet, Refills: 3    ferrous sulfate 325 (65 FE) MG tablet Take 325 mg by mouth once a week. Patient takes on Wednesday    fexofenadine (ALLEGRA) 180 MG tablet Take 180 mg by mouth daily.    fluticasone (FLONASE) 50 MCG/ACT nasal spray Place 2 sprays into both nostrils daily. Qty: 16 g, Refills: 12    furosemide (LASIX) 40 MG tablet Take 40 mg by mouth daily.     ketoconazole (NIZORAL) 2 % shampoo Apply 1 application topically as needed for irritation.    losartan (COZAAR) 25 MG tablet Take 1 tablet (25 mg total) by mouth daily. Qty: 90 tablet, Refills: 4   Associated Diagnoses: Essential hypertension    magnesium 30 MG tablet Take 30 mg by mouth daily.    metoprolol succinate (TOPROL-XL) 50 MG 24 hr tablet Take 1 tablet (50 mg total) by mouth 2 (two) times daily. Take with or immediately following a meal. Qty: 180 tablet, Refills: 4   Associated Diagnoses: Essential hypertension    Multiple Vitamins-Minerals (CENTRUM ADULTS PO) Take 1 tablet by mouth daily.     omeprazole (PRILOSEC) 20 MG capsule Take 1 capsule (20 mg total) by mouth 2 (two) times daily before a meal. Qty: 180 capsule, Refills: 4    tiotropium (SPIRIVA) 18 MCG inhalation capsule Place 1 capsule (18 mcg total) into inhaler and inhale daily. Qty: 90 capsule, Refills: 4   Associated Diagnoses: Chronic obstructive pulmonary disease, unspecified COPD type ([place])      STOP taking these medications     mupirocin ointment (BACTROBAN) 2 %         If you experience worsening of your admission symptoms, develop shortness of breath, life threatening emergency, suicidal or homicidal thoughts you must seek medical attention immediately by calling 911 or calling your MD immediately  if symptoms less severe.  You Must read  complete instructions/literature along with all the possible adverse reactions/side effects for all the Medicines you take and that have been prescribed to you. Take any new Medicines after you have completely understood and accept all the possible adverse reactions/side effects.   Please note  You were cared for by a hospitalist during your hospital stay. If you have any questions about your discharge medications or the care you received while you were in the hospital after you are discharged, you can call the unit and asked to speak with the hospitalist on call if the hospitalist that took care of you is not available. Once you are discharged, your primary care physician will handle any further medical issues. Please note that NO REFILLS for any discharge medications will be authorized once you are discharged, as it is imperative that you return to your primary care physician (or establish a relationship with a primary care physician if you do not have one) for your aftercare needs so  that they can reassess your need for medications and monitor your lab values. Today   SUBJECTIVE   Patient doing well  VITAL SIGNS:  Blood pressure 138/86, pulse 94, temperature 97.8 F (36.6 C), temperature source Oral, resp. rate 14, height 5\' 3"  (1.6 m), weight 68 kg (150 lb), SpO2 97 %.  I/O:    Intake/Output Summary (Last 24 hours) at 06/15/17 0921 Last data filed at 06/14/17 1800  Gross per 24 hour  Intake           598.27 ml  Output                0 ml  Net           598.27 ml    PHYSICAL EXAMINATION:  GENERAL:  76 y.o.-year-old patient lying in the bed with no acute distress.  EYES: Pupils equal, round, reactive to light and accommodation. No scleral icterus. Extraocular muscles intact.  HEENT: Head atraumatic, normocephalic. Oropharynx and nasopharynx clear.  NECK:  Supple, no jugular venous distention. No thyroid enlargement, no tenderness.  LUNGS: Normal breath sounds bilaterally, no  wheezing, rales,rhonchi or crepitation. No use of accessory muscles of respiration.  CARDIOVASCULAR: S1, S2 normal. No murmurs, rubs, or gallops.  ABDOMEN: Soft, non-tender, non-distended. Bowel sounds present. No organomegaly or mass.  EXTREMITIES: No pedal edema, cyanosis, or clubbing. Laceration over the left elbow dressing present no active bleeding NEUROLOGIC: Cranial nerves II through XII are intact. Muscle strength 5/5 in all extremities. Sensation intact. Gait not checked. Speech clear  PSYCHIATRIC: The patient is alert and oriented x 3.  SKIN: No obvious rash, lesion, or ulcer.   DATA REVIEW:   CBC   Recent Labs Lab 06/13/17 1950  WBC 6.4  HGB 14.1  HCT 40.7  PLT 247    Chemistries   Recent Labs Lab 06/13/17 1950  NA 137  K 4.1  CL 99*  CO2 27  GLUCOSE 128*  BUN 20  CREATININE 1.23  CALCIUM 9.6  AST 37  ALT 15*  ALKPHOS 62  BILITOT 1.1    Microbiology Results   No results found for this or any previous visit (from the past 240 hour(s)).  RADIOLOGY:  US Carotid Bilateral (at [facility] Only)  Result Date: 06/14/2017 CLINICAL DATA:  Aphasia. EXAM: BILATERAL CAROTID DUPLEX ULTRASOUND TECHNIQUE: Pearline Cables scale imaging, color Doppler and duplex ultrasound were performed of bilateral carotid and vertebral arteries in the neck. COMPARISON:  CT 06/13/2017. FINDINGS: Criteria: Quantification of carotid stenosis is based on velocity parameters that correlate the residual internal carotid diameter with NASCET-based stenosis levels, using the diameter of the distal internal carotid lumen as the denominator for stenosis measurement. The following velocity measurements were obtained: RIGHT ICA:  80.6/24.5 cm/sec CCA:  44.1/ 9.0 cm/sec SYSTOLIC ICA/CCA RATIO:  1.8 DIASTOLIC ICA/CCA RATIO:  2.7 ECA:  103.4 cm/sec LEFT ICA:  6.2/ 16.8 cm/sec CCA:  95.6/2.1 cm/sec SYSTOLIC ICA/CCA RATIO:  1.7 DIASTOLIC ICA/CCA RATIO:  2.5 ECA:  75.6 cm/sec RIGHT CAROTID ARTERY: Diffuse mild right  carotid atherosclerotic vascular disease with mild to moderate plaque right carotid bifurcation proximal ICA. Degree of stenosis less than 50%. RIGHT VERTEBRAL ARTERY:  Patent with antegrade flow. LEFT CAROTID ARTERY: Left carotid bifurcation and proximal ICA mild to moderate atherosclerotic vascular plaque. Degree of stenosis less than 50%. LEFT VERTEBRAL ARTERY:  Patent with antegrade flow. IMPRESSION: 1. Bilateral carotid atherosclerotic vascular disease with degree of stenosis less than 50% bilaterally. 2. Vertebrals are patent with antegrade flow  bilaterally . Electronically Signed   By: Marcello Moores  Register   On: 06/14/2017 10:41   Ct Head Code Stroke W/o Cm  Result Date: 06/13/2017 CLINICAL DATA:  Code stroke.  Slurred speech and confusion EXAM: CT HEAD WITHOUT CONTRAST TECHNIQUE: Contiguous axial images were obtained from the base of the skull through the vertex without intravenous contrast. COMPARISON:  Head CT 02/14/2015 FINDINGS: Brain: No mass lesion or acute hemorrhage. No focal hypoattenuation of the basal ganglia or cortex to indicate infarcted tissue. No hydrocephalus or age advanced atrophy. There is periventricular hypoattenuation compatible with chronic microvascular disease. Vascular: No hyperdense vessel. Atherosclerotic calcification of the internal carotid and vertebral arteries at the skull base. Skull: Normal visualized skull base, calvarium and extracranial soft tissues. Sinuses/Orbits: No sinus fluid levels or advanced mucosal thickening. No mastoid effusion. Normal orbits. ASPECTS [facility] Stroke Program Early CT Score) - Ganglionic level infarction (caudate, lentiform nuclei, internal capsule, insula, M1-M3 cortex): 7 - Supraganglionic infarction (M4-M6 cortex): 3 Total score (0-10 with 10 being normal): 10 IMPRESSION: 1. No hemorrhage or mass lesion. 2. ASPECTS is 10. These results were called by telephone at the time of interpretation on 06/13/2017 at 7:50 pm to Dr. Mable Paris, who  verbally acknowledged these results. Electronically Signed   By: Ulyses Jarred M.D.   On: 06/13/2017 19:50     Management plans discussed with the patient, family and they are in agreement.  CODE STATUS:     Code Status Orders        Start     Ordered   06/13/17 2328  Full code  Continuous     06/13/17 2328    Code Status History    Date Active Date Inactive Code Status Order ID Comments User Context   This patient has a current code status but no historical code status.    Advance Directive Documentation     Most Recent Value  Type of Advance Directive  Healthcare Power of Attorney, Living will  Pre-existing out of facility DNR order (yellow form or pink MOST form)  -  "MOST" Form in Place?  -      TOTAL TIME TAKING CARE OF THIS PATIENT: *40* minutes.    Lacara Dunsworth M.D on 06/15/2017 at 9:21 AM  Between 7am to 6pm - Pager - [phone] After 6pm go to [email or web address] - password [email or web address] [facility]  Office  [phone]  CC: Primary care physician; Guadalupe Maple, MD

## 2017-06-15 NOTE — Care Management CC44 (Signed)
Condition Code 44 Documentation Completed  Patient Details  Name: Kirk Jones MRN: [id number] Date of Birth: [birth year]   Condition Code 44 given:    Patient signature on Condition Code 44 notice:    Documentation of 2 MD's agreement:   yes Code 44 added to claim:   yes    Beau Fanny, RN 06/15/2017, 10:02 AM

## 2017-06-15 NOTE — Telephone Encounter (Signed)
Call from ER (Dr. Posey Pronto) as pt came in for aphasia.  Restarted on anti-coagulants that was stopped for 2 days based on a laceration.  Needs a PT/INR .  INR 1.38 on Coumadin 4 mg and Lovenox sub Q.  Ideal range 2.5-3.5  Wants him checked tomorrow for a dressing change and INR check

## 2017-06-15 NOTE — Discharge Instructions (Signed)
Patient instructed to take Lovenox shots 2 times a day until INR is between 2.5-3.5 His INR will be managed by [facility] family practice.

## 2017-06-15 NOTE — Progress Notes (Addendum)
ANTICOAGULATION CONSULT NOTE - Follow UP   Pharmacy Consult for heparin Indication: atrial fibrillation, heart valve  Allergies  Allergen Reactions  . Prednisone Other (See Comments)    Reaction: "organs shutting down"    Patient Measurements: Height: 5\' 3"  (160 cm) Weight: 150 lb (68 kg) IBW/kg (Calculated) : 56.9 Heparin Dosing Weight: 68 kg  Vital Signs: Temp: 97.8 F (36.6 C) (07/18 0604) Temp Source: Oral (07/18 0604) BP: 138/86 (07/18 0604) Pulse Rate: 94 (07/18 0604)  Labs:  Recent Labs  06/13/17 1950 06/14/17 0453 06/14/17 1117 06/14/17 2011 06/15/17 0443 06/15/17 0702  HGB 14.1  --   --   --   --   --   HCT 40.7  --   --   --   --   --   PLT 247  --   --   --   --   --   APTT 29  --   --   --   --   --   LABPROT 17.5* 17.9*  --   --  17.1*  --   INR 1.42 1.46  --   --  1.38  --   HEPARINUNFRC  --   --  0.42 0.17*  --  0.53  CREATININE 1.23  --   --   --   --   --   TROPONINI 0.03*  --   --   --   --   --     Estimated Creatinine Clearance: 41.1 mL/min (by C-G formula based on SCr of 1.23 mg/dL).   Medical History: Past Medical History:  Diagnosis Date  . Aortic valve regurgitation   . Atrial fibrillation ([place])   . Barrett's esophagus   . COPD (chronic obstructive pulmonary disease) ([place])   . Diverticulosis   . Gout   . Hematuria   . History of prosthetic heart valve   . Hyperlipidemia   . Hypertension   . Nodular prostate with urinary obstruction   . Substance abuse    alcohol    Medications:  Scheduled:  . aspirin EC  81 mg Oral Daily  . diltiazem  180 mg Oral Daily  . enoxaparin (LOVENOX) injection  1 mg/kg Subcutaneous Q12H  . febuxostat  40 mg Oral Daily  . metoprolol succinate  50 mg Oral BID  . mupirocin cream   Topical Daily  . pantoprazole  40 mg Oral Daily  . tiotropium  18 mcg Inhalation Daily  . warfarin  4 mg Oral q1800    Assessment: Patient takes warfarin PTA for atrial fibrillation and mechanical aortic valve. INR  subtherapeutic on admission after being held for several days 2/2 a laceration.  Warfarin home dose is 3.5 mg daily Per outpatient cardiology notes, targeted INR is 2.5 - 3.5  Goal of Therapy:  INR 2.5 - 3.5   Plan:  HL = 0.53 was therapeutic this morning. Per discussion with MD, patient will be transitioned from heparin drip to enoxaparin injections for bridge therapy.  Heparin drip to stop at 0900 this morning. Enoxaparin 1 mg/kg subQ q12h to begin at 1000  Continue warfarin 4 mg PO daily this evening. INR 1.38.  Lenis Noon, PharmD, BCPS Clinical Pharmacist 06/15/2017

## 2017-06-15 NOTE — Progress Notes (Signed)
Pt has been discharge home. Discharge papers given and explained to pt, verbalized understanding. Meds and f/u appointments reviewed with Pt. Lovenox and warfarin to be picked up from pharmacy. Pt is aware. Escorted in a wheelchair.

## 2017-06-16 ENCOUNTER — Ambulatory Visit: Payer: Medicare Other | Admitting: Family Medicine

## 2017-06-17 ENCOUNTER — Ambulatory Visit (INDEPENDENT_AMBULATORY_CARE_PROVIDER_SITE_OTHER): Payer: Medicare Other | Admitting: Family Medicine

## 2017-06-17 ENCOUNTER — Telehealth: Payer: Self-pay

## 2017-06-17 ENCOUNTER — Encounter: Payer: Self-pay | Admitting: Family Medicine

## 2017-06-17 ENCOUNTER — Ambulatory Visit: Payer: Medicare Other | Admitting: Family Medicine

## 2017-06-17 VITALS — BP 136/84 | HR 88 | Temp 98.2°F | Wt 158.0 lb

## 2017-06-17 DIAGNOSIS — Z952 Presence of prosthetic heart valve: Secondary | ICD-10-CM | POA: Diagnosis not present

## 2017-06-17 DIAGNOSIS — I4891 Unspecified atrial fibrillation: Secondary | ICD-10-CM

## 2017-06-17 DIAGNOSIS — I639 Cerebral infarction, unspecified: Secondary | ICD-10-CM | POA: Diagnosis not present

## 2017-06-17 DIAGNOSIS — R4701 Aphasia: Secondary | ICD-10-CM

## 2017-06-17 DIAGNOSIS — T148XXA Other injury of unspecified body region, initial encounter: Secondary | ICD-10-CM | POA: Diagnosis not present

## 2017-06-17 NOTE — Telephone Encounter (Signed)
I have made the 1st attempt to contact the patient or family member in charge, in order to follow up from recently being discharged from the hospital. I left a message on voicemail but I will make another attempt at a different time.  

## 2017-06-17 NOTE — Progress Notes (Signed)
BP 136/84 (BP Location: Right Arm, Patient Position: Sitting, Cuff Size: Normal)   Pulse 88   Temp 98.2 F (36.8 C)   Wt 158 lb (71.7 kg)   SpO2 98%   BMI 27.99 kg/m    Subjective:    [name], male    DOB: 1941-08-18, 76 y.o.   MRN: [id number]  HPI: Kirk Jones is a 76 y.o. male  Chief Complaint  Patient presents with  . Coagulation Disorder  . Abrasion  . Wound Check  . Hospitalization Follow-up   Patient presents today for hospital follow up for possible ischemic CVA with expressive aphasia. Sxs have fully resolved at this time. CT head negative, unable to do MRI d/t pacemaker. Currently on 4 mg coumadin daily and 7 units of lovenox. INR was low in ER at 1.42.   Also here to redress abrasions from recent fall. Wounds doing well, no further bleeding episodes or worsening redness in the areas. Denies fever, chills, sweats. Finishing keflex today which he has been on now for 2 weeks.   Past Medical History:  Diagnosis Date  . Aortic valve regurgitation   . Atrial fibrillation ([place])   . Barrett's esophagus   . COPD (chronic obstructive pulmonary disease) ([place])   . Diverticulosis   . Gout   . Hematuria   . History of prosthetic heart valve   . Hyperlipidemia   . Hypertension   . Nodular prostate with urinary obstruction   . Substance abuse    alcohol   Social History   Social History  . Marital status: Married    Spouse name: N/A  . Number of children: N/A  . Years of education: N/A   Occupational History  . Not on file.   Social History Main Topics  . Smoking status: Former Smoker    Types: Cigarettes    Quit date: 04/24/1991  . Smokeless tobacco: Never Used  . Alcohol use Yes     Comment: quit December 2015/restarted  . Drug use: No  . Sexual activity: No   Other Topics Concern  . Not on file   Social History Narrative  . No narrative on file   Relevant past medical, surgical, family and social history reviewed and  updated as indicated. Interim medical history since our last visit reviewed. Allergies and medications reviewed and updated.  Review of Systems  Constitutional: Negative.   HENT: Negative.   Respiratory: Negative.   Cardiovascular: Negative.   Gastrointestinal: Negative.   Genitourinary: Negative.   Musculoskeletal: Negative.   Skin: Positive for wound.  Neurological:       Expressive aphasia - now resolved  Hematological: Does not bruise/bleed easily.  Psychiatric/Behavioral: Negative.    Per HPI unless specifically indicated above     Objective:    BP 136/84 (BP Location: Right Arm, Patient Position: Sitting, Cuff Size: Normal)   Pulse 88   Temp 98.2 F (36.8 C)   Wt 158 lb (71.7 kg)   SpO2 98%   BMI 27.99 kg/m   Wt Readings from Last 3 Encounters:  06/17/17 158 lb (71.7 kg)  06/13/17 150 lb (68 kg)  06/13/17 155 lb (70.3 kg)    Physical Exam  Constitutional: He is oriented to person, place, and time. He appears well-developed and well-nourished. No distress.  HENT:  Head: Atraumatic.  Eyes: Pupils are equal, round, and reactive to light. Conjunctivae are normal.  Neck: Normal range of motion. Neck supple.  Cardiovascular: Normal rate.  Pulmonary/Chest: Effort normal and breath sounds normal. No respiratory distress.  Musculoskeletal: Normal range of motion. He exhibits no edema or tenderness.  Neurological: He is alert and oriented to person, place, and time. No cranial nerve deficit.  Skin: Skin is warm and dry.  All three wounds healing well with no signs of infection.   Psychiatric: He has a normal mood and affect. His behavior is normal.  Nursing note and vitals reviewed.  Results for orders placed or performed during the hospital encounter of 06/13/17  Protime-INR  Result Value Ref Range   Prothrombin Time 17.5 (H) 11.4 - 15.2 seconds   INR 1.42   APTT  Result Value Ref Range   aPTT 29 24 - 36 seconds  CBC  Result Value Ref Range   WBC 6.4 3.8 -  10.6 K/uL   RBC 3.98 (L) 4.40 - 5.90 MIL/uL   Hemoglobin 14.1 13.0 - 18.0 g/dL   HCT 40.7 40.0 - 52.0 %   MCV 102.3 (H) 80.0 - 100.0 fL   MCH 35.5 (H) 26.0 - 34.0 pg   MCHC 34.7 32.0 - 36.0 g/dL   RDW 14.7 (H) 11.5 - 14.5 %   Platelets 247 150 - 440 K/uL  Differential  Result Value Ref Range   Neutrophils Relative % 69 %   Neutro Abs 4.4 1.4 - 6.5 K/uL   Lymphocytes Relative 12 %   Lymphs Abs 0.8 (L) 1.0 - 3.6 K/uL   Monocytes Relative 17 %   Monocytes Absolute 1.1 (H) 0.2 - 1.0 K/uL   Eosinophils Relative 1 %   Eosinophils Absolute 0.1 0 - 0.7 K/uL   Basophils Relative 1 %   Basophils Absolute 0.1 0 - 0.1 K/uL  Comprehensive metabolic panel  Result Value Ref Range   Sodium 137 135 - 145 mmol/L   Potassium 4.1 3.5 - 5.1 mmol/L   Chloride 99 (L) 101 - 111 mmol/L   CO2 27 22 - 32 mmol/L   Glucose, Bld 128 (H) 65 - 99 mg/dL   BUN 20 6 - 20 mg/dL   Creatinine, Ser 1.23 0.61 - 1.24 mg/dL   Calcium 9.6 8.9 - 10.3 mg/dL   Total Protein 7.6 6.5 - 8.1 g/dL   Albumin 4.2 3.5 - 5.0 g/dL   AST 37 15 - 41 U/L   ALT 15 (L) 17 - 63 U/L   Alkaline Phosphatase 62 38 - 126 U/L   Total Bilirubin 1.1 0.3 - 1.2 mg/dL   GFR calc non Af Amer 55 (L) >60 mL/min   GFR calc Af Amer >60 >60 mL/min   Anion gap 11 5 - 15  Troponin I  Result Value Ref Range   Troponin I 0.03 (HH) <0.03 ng/mL  Hemoglobin A1c  Result Value Ref Range   Hgb A1c MFr Bld 5.2 4.8 - 5.6 %   Mean Plasma Glucose 103 mg/dL  Lipid panel  Result Value Ref Range   Cholesterol 189 0 - 200 mg/dL   Triglycerides 77 <150 mg/dL   HDL 75 >40 mg/dL   Total CHOL/HDL Ratio 2.5 RATIO   VLDL 15 0 - 40 mg/dL   LDL Cholesterol 99 0 - 99 mg/dL  Protime-INR  Result Value Ref Range   Prothrombin Time 17.9 (H) 11.4 - 15.2 seconds   INR 1.46   Heparin level (unfractionated)  Result Value Ref Range   Heparin Unfractionated 0.42 0.30 - 0.70 IU/mL  Heparin level (unfractionated)  Result Value Ref Range   Heparin Unfractionated 0.17  (  L) 0.30 - 0.70 IU/mL  Protime-INR  Result Value Ref Range   Prothrombin Time 17.1 (H) 11.4 - 15.2 seconds   INR 1.38   Heparin level (unfractionated)  Result Value Ref Range   Heparin Unfractionated 0.53 0.30 - 0.70 IU/mL  ECHOCARDIOGRAM COMPLETE  Result Value Ref Range   Weight 2,400 oz   Height 63 in   BP 155/90 mmHg      Assessment & Plan:   Problem List Items Addressed This Visit      Cardiovascular and Mediastinum   Atrial fibrillation (HCC) - Primary (Chronic)    INR remains subtherapeutic at 1.5 today. Likely resistant d/t abx. Will watch carefully now that course has been completed. Complete remaining lovenox injections and increase coumadin to 4.5. Hopefully will be able to return to previous dosing once abx are out of system. Return for bleeding or bruising issues in meantime      Relevant Orders   CoaguChek XS/INR Waived     Other   History of prosthetic heart valve (Chronic)   Relevant Orders   CoaguChek XS/INR Waived   Expressive aphasia    Suspected to be from acute ischemic CVA. Sxs have resolved at this time and pt has undergone full workup in hospital. F/u with any further sxs.        Other Visit Diagnoses    Abrasion       Completed abx, watch carefully for infection now that off of them. Continue wound care with vaseline or neosporin, will recheck in 5 days.        Follow up plan: Return in about 5 days (around 06/22/2017) for Wound check, INR.

## 2017-06-17 NOTE — Telephone Encounter (Signed)
Transition Care Management Follow-up Telephone Call  Date discharged? 06/15/2017  How have you been since you were released from the hospital? "Im doing Fine"  Do you understand why you were in the hospital? Yes  Do you understand the discharge instructions? Yes  Where were you discharged to? Home   Items Reviewed: Medications reviewed: YES Allergies reviewed: YES Dietary changes reviewed: YES Referrals reviewed: YES   Functional Questionnaire:  Activities of Daily Living (ADLs):   states they are independent in the following:  feeding, transferring, bathing, transportation  States they require assistance with the following: none  Any transportation issues/concerns?: No  Any patient concerns? No  Confirmed importance and date/time of follow-up visits scheduled:  Provider Appointment booked with Merrie Roof on 06/17/2017 at 1:30 pm   Confirmed with patient if condition begins to worsen call PCP or go to the ER.  Patient was given the office number and encouraged to call back with question or concerns.  : YES

## 2017-06-17 NOTE — Patient Instructions (Signed)
4.5 mg daily. Continue lovenox as before

## 2017-06-18 ENCOUNTER — Other Ambulatory Visit: Payer: Self-pay | Admitting: Internal Medicine

## 2017-06-18 MED ORDER — ENOXAPARIN SODIUM 80 MG/0.8ML ~~LOC~~ SOLN: 1.0000 mg/kg | Freq: Two times a day (BID) | SUBCUTANEOUS | 0 refills | Status: DC

## 2017-06-20 ENCOUNTER — Ambulatory Visit: Payer: Self-pay | Admitting: Family Medicine

## 2017-06-20 NOTE — Assessment & Plan Note (Signed)
INR remains subtherapeutic at 1.5 today. Likely resistant d/t abx. Will watch carefully now that course has been completed. Complete remaining lovenox injections and increase coumadin to 4.5. Hopefully will be able to return to previous dosing once abx are out of system. Return for bleeding or bruising issues in meantime

## 2017-06-20 NOTE — Assessment & Plan Note (Signed)
Suspected to be from acute ischemic CVA. Sxs have resolved at this time and pt has undergone full workup in hospital. F/u with any further sxs.

## 2017-06-22 ENCOUNTER — Encounter: Payer: Self-pay | Admitting: Family Medicine

## 2017-06-22 ENCOUNTER — Ambulatory Visit (INDEPENDENT_AMBULATORY_CARE_PROVIDER_SITE_OTHER): Payer: Medicare Other | Admitting: Family Medicine

## 2017-06-22 ENCOUNTER — Other Ambulatory Visit: Payer: Self-pay | Admitting: Family Medicine

## 2017-06-22 VITALS — BP 156/81 | HR 99 | Temp 98.4°F | Wt 155.0 lb

## 2017-06-22 DIAGNOSIS — Z952 Presence of prosthetic heart valve: Secondary | ICD-10-CM | POA: Diagnosis not present

## 2017-06-22 DIAGNOSIS — I4891 Unspecified atrial fibrillation: Secondary | ICD-10-CM

## 2017-06-22 DIAGNOSIS — T148XXA Other injury of unspecified body region, initial encounter: Secondary | ICD-10-CM

## 2017-06-22 DIAGNOSIS — R4701 Aphasia: Secondary | ICD-10-CM | POA: Diagnosis not present

## 2017-06-22 DIAGNOSIS — I639 Cerebral infarction, unspecified: Secondary | ICD-10-CM

## 2017-06-22 NOTE — Assessment & Plan Note (Addendum)
INR at goal today at 2.7. Will monitor closely over the next week as the antibiotic comes out of his system and adjust his dose down to 3.5 mg alternating with 4 mg.

## 2017-06-22 NOTE — Patient Instructions (Signed)
Alternate 3.5 mg and 4 mg tablets

## 2017-06-22 NOTE — Progress Notes (Signed)
BP (!) 156/81   Pulse 99   Temp 98.4 F (36.9 C)   Wt 155 lb (70.3 kg)   SpO2 97%   BMI 27.46 kg/m    Subjective:    [name], male    DOB: 06-09-1941, 76 y.o.   MRN: [id number]  HPI: Kirk Jones is a 76 y.o. male  Chief Complaint  Patient presents with  . Wound Check    doing better, finished the abx last night  . Anticoagulation    still taking Coumadin 4.5mg  and Lovenox injections   Patient presents today for INR recheck and wound check. Taking 4.5 mg coumadin daily and .7 units of lovenox with persistently subtherapeutic INR. Has been on several weeks of keflex due to some significant skin abrasions from a fall. Has finished the keflex and lovenox as of last night. No bleeding or bruising issues, continued neurologic deficits (given recent hx of expressive aphasia), CP, palpitations. Wounds doing well, pain remains under good control and no fevers, chills, drainage.   Past Medical History:  Diagnosis Date  . Aortic valve regurgitation   . Atrial fibrillation ([place])   . Barrett's esophagus   . COPD (chronic obstructive pulmonary disease) ([place])   . Diverticulosis   . Gout   . Hematuria   . History of prosthetic heart valve   . Hyperlipidemia   . Hypertension   . Nodular prostate with urinary obstruction   . Substance abuse    alcohol   Social History   Social History  . Marital status: Married    Spouse name: N/A  . Number of children: N/A  . Years of education: N/A   Occupational History  . Not on file.   Social History Main Topics  . Smoking status: Former Smoker    Types: Cigarettes    Quit date: 04/24/1991  . Smokeless tobacco: Never Used  . Alcohol use Yes     Comment: quit December 2015/restarted  . Drug use: No  . Sexual activity: No   Other Topics Concern  . Not on file   Social History Narrative  . No narrative on file   Relevant past medical, surgical, family and social history reviewed and updated as  indicated. Interim medical history since our last visit reviewed. Allergies and medications reviewed and updated.  Review of Systems  Constitutional: Negative.   HENT: Negative.   Respiratory: Negative.   Cardiovascular: Negative.   Gastrointestinal: Negative.   Musculoskeletal: Negative.   Skin: Positive for wound.  Neurological: Negative.   Hematological: Does not bruise/bleed easily.  Psychiatric/Behavioral: Negative.    Per HPI unless specifically indicated above     Objective:    BP (!) 156/81   Pulse 99   Temp 98.4 F (36.9 C)   Wt 155 lb (70.3 kg)   SpO2 97%   BMI 27.46 kg/m   Wt Readings from Last 3 Encounters:  06/22/17 155 lb (70.3 kg)  06/17/17 158 lb (71.7 kg)  06/13/17 150 lb (68 kg)    Physical Exam  Constitutional: He is oriented to person, place, and time. He appears well-developed and well-nourished. No distress.  HENT:  Head: Atraumatic.  Eyes: Pupils are equal, round, and reactive to light. Conjunctivae are normal. No scleral icterus.  Neck: Normal range of motion. Neck supple.  Cardiovascular: Normal rate.   Pulmonary/Chest: Effort normal and breath sounds normal. No respiratory distress.  Musculoskeletal: Normal range of motion.  Neurological: He is alert and oriented to  person, place, and time.  Skin: Skin is warm and dry.  Wounds continue to heal well. No evidence of local skin infection at any of the 3 sites. Still a 7-8 mm wound bed on right forearm but others have now dried up  Psychiatric: He has a normal mood and affect. His behavior is normal.  Nursing note and vitals reviewed.  Results for orders placed or performed in visit on 06/17/17  CoaguChek XS/INR Waived  Result Value Ref Range   INR 1.5 (H) 0.9 - 1.1   Prothrombin Time 17.6 sec      Assessment & Plan:   Problem List Items Addressed This Visit      Cardiovascular and Mediastinum   Atrial fibrillation ([place]) - Primary (Chronic)    INR at goal today at 2.7. Will monitor  closely over the next week as the antibiotic comes out of his system and adjust his dose down to 3.5 mg alternating with 4 mg.       Relevant Orders   CoaguChek XS/INR Waived     Other   History of prosthetic heart valve (Chronic)   Relevant Orders   CoaguChek XS/INR Waived   Expressive aphasia    No persistent sxs. Continue anticoagulation as noted       Other Visit Diagnoses    Skin abrasion       Antibiotics completed. Wounds cleaned with saline and redressed today. Healing very well. Continue good wound care. Return precautions reviewed       Follow up plan: Return in about 5 days (around 06/27/2017) for INR, wound check.

## 2017-06-23 ENCOUNTER — Ambulatory Visit: Payer: Medicare Other | Admitting: Family Medicine

## 2017-06-23 LAB — COAGUCHEK XS/INR WAIVED
INR: 1.5 — AB (ref 0.9–1.1)
INR: 2.7 — AB (ref 0.9–1.1)
PROTHROMBIN TIME: 17.6 s
Prothrombin Time: 32.8 s

## 2017-06-23 NOTE — Assessment & Plan Note (Signed)
No persistent sxs. Continue anticoagulation as noted

## 2017-06-24 ENCOUNTER — Ambulatory Visit: Payer: Self-pay | Admitting: Family Medicine

## 2017-06-27 ENCOUNTER — Encounter: Payer: Self-pay | Admitting: Family Medicine

## 2017-06-27 ENCOUNTER — Ambulatory Visit (INDEPENDENT_AMBULATORY_CARE_PROVIDER_SITE_OTHER): Payer: Medicare Other | Admitting: Family Medicine

## 2017-06-27 VITALS — BP 139/77 | HR 91 | Temp 98.4°F | Wt 155.0 lb

## 2017-06-27 DIAGNOSIS — Z952 Presence of prosthetic heart valve: Secondary | ICD-10-CM | POA: Diagnosis not present

## 2017-06-27 DIAGNOSIS — I4891 Unspecified atrial fibrillation: Secondary | ICD-10-CM | POA: Diagnosis not present

## 2017-06-27 DIAGNOSIS — T148XXA Other injury of unspecified body region, initial encounter: Secondary | ICD-10-CM | POA: Diagnosis not present

## 2017-06-27 DIAGNOSIS — I639 Cerebral infarction, unspecified: Secondary | ICD-10-CM

## 2017-06-27 LAB — COAGUCHEK XS/INR WAIVED
INR: 2.4 — ABNORMAL HIGH (ref 0.9–1.1)
PROTHROMBIN TIME: 28.4 s

## 2017-06-27 NOTE — Patient Instructions (Signed)
Follow up in 4 days

## 2017-06-27 NOTE — Assessment & Plan Note (Signed)
INR near goal at 2.4. Will keep current dose of 3.5 and 4 mg alternating. Was previously stable on 3.5 mg daily prior to fall and long course of abx.

## 2017-06-27 NOTE — Progress Notes (Signed)
BP 139/77   Pulse 91   Temp 98.4 F (36.9 C)   Wt 155 lb (70.3 kg)   SpO2 98%   BMI 27.46 kg/m    Subjective:    [name], male    DOB: 08-25-1941, 76 y.o.   MRN: [id number]  HPI: Kirk Jones is a 76 y.o. male  Chief Complaint  Patient presents with  . Anticoagulation    alternating between 3.5 and 4mg   . Wound Check   Patient presents today for INR f/u and wound dressing changes. Currently alternating between 3.5 and 4 mg coumadin daily. Watching INR closely as the antibiotic was recently completed. No bleeding or bruising issues. Wounds doing well, no drainage, redness, fevers. Has kept dressings in place since previous visit.   Relevant past medical, surgical, family and social history reviewed and updated as indicated. Interim medical history since our last visit reviewed. Allergies and medications reviewed and updated.  Review of Systems  Constitutional: Negative.   HENT: Negative.   Respiratory: Negative.   Cardiovascular: Negative.   Musculoskeletal: Negative.   Neurological: Negative.   Hematological: Does not bruise/bleed easily.  Psychiatric/Behavioral: Negative.    Per HPI unless specifically indicated above     Objective:    BP 139/77   Pulse 91   Temp 98.4 F (36.9 C)   Wt 155 lb (70.3 kg)   SpO2 98%   BMI 27.46 kg/m   Wt Readings from Last 3 Encounters:  06/27/17 155 lb (70.3 kg)  06/22/17 155 lb (70.3 kg)  06/17/17 158 lb (71.7 kg)    Physical Exam  Constitutional: He is oriented to person, place, and time. He appears well-developed and well-nourished. No distress.  HENT:  Head: Atraumatic.  Eyes: Pupils are equal, round, and reactive to light. Conjunctivae are normal. No scleral icterus.  Neck: Normal range of motion. Neck supple.  Cardiovascular: Normal rate.   Pulmonary/Chest: Effort normal and breath sounds normal. No respiratory distress.  Musculoskeletal: Normal range of motion.  Neurological: He is  alert and oriented to person, place, and time.  Skin: Skin is warm and dry.  Right forearm and left knee abrasions almost completely healed, left forearm healing well with no evidence of infection  Psychiatric: He has a normal mood and affect. His behavior is normal.  Nursing note and vitals reviewed.  Results for orders placed or performed in visit on 06/22/17  CoaguChek XS/INR Waived  Result Value Ref Range   INR 2.7 (H) 0.9 - 1.1   Prothrombin Time 32.8 sec      Assessment & Plan:   Problem List Items Addressed This Visit      Cardiovascular and Mediastinum   Atrial fibrillation ([place]) - Primary (Chronic)    INR near goal at 2.4. Will keep current dose of 3.5 and 4 mg alternating. Was previously stable on 3.5 mg daily prior to fall and long course of abx.       Relevant Orders   CoaguChek XS/INR Waived     Other   History of prosthetic heart valve (Chronic)   Relevant Orders   CoaguChek XS/INR Waived    Other Visit Diagnoses    Abrasion of skin       Left forearm irrigated with saline, dressed with antibiotic ointment, wrapped with nonstick gauze and ace wrap. Continue wound care, dressing change in 4 days       Follow up plan: Return in about 4 days (around 07/01/2017) for Wound check,  INR.

## 2017-07-01 ENCOUNTER — Ambulatory Visit (INDEPENDENT_AMBULATORY_CARE_PROVIDER_SITE_OTHER): Payer: Medicare Other | Admitting: Family Medicine

## 2017-07-01 ENCOUNTER — Encounter: Payer: Self-pay | Admitting: Family Medicine

## 2017-07-01 VITALS — BP 155/66 | HR 80 | Temp 98.7°F | Wt 156.0 lb

## 2017-07-01 DIAGNOSIS — I639 Cerebral infarction, unspecified: Secondary | ICD-10-CM

## 2017-07-01 DIAGNOSIS — I4891 Unspecified atrial fibrillation: Secondary | ICD-10-CM | POA: Diagnosis not present

## 2017-07-01 DIAGNOSIS — T148XXA Other injury of unspecified body region, initial encounter: Secondary | ICD-10-CM | POA: Diagnosis not present

## 2017-07-01 DIAGNOSIS — Z952 Presence of prosthetic heart valve: Secondary | ICD-10-CM | POA: Diagnosis not present

## 2017-07-01 LAB — COAGUCHEK XS/INR WAIVED
INR: 2.6 — AB (ref 0.9–1.1)
Prothrombin Time: 31.1 s

## 2017-07-01 NOTE — Patient Instructions (Signed)
Follow up for wound check in 4 days

## 2017-07-01 NOTE — Progress Notes (Signed)
   BP (!) 155/66   Pulse 80   Temp 98.7 F (37.1 C)   Wt 156 lb (70.8 kg)   SpO2 98%   BMI 27.63 kg/m    Subjective:    [name], male    DOB: 12/13/1940, 76 y.o.   MRN: [id number]  HPI: Kirk Jones is a 76 y.o. male  Chief Complaint  Patient presents with  . Anticoagulation    alternating between 3.5 and 4mg   . Wound Check   Patient presents today for INR recheck and wound redress. Alternating his coumadin between 3.5 mg and 4 mg daily. No bleeding or bruising issues. Wound on left forearm healing well without complication. No fevers, chills, CP, SOB, palpitations.   Relevant past medical, surgical, family and social history reviewed and updated as indicated. Interim medical history since our last visit reviewed. Allergies and medications reviewed and updated.  Review of Systems  Constitutional: Negative.   Respiratory: Negative.   Cardiovascular: Negative.   Gastrointestinal: Negative.   Genitourinary: Negative.   Musculoskeletal: Negative.   Neurological: Negative.   Hematological: Does not bruise/bleed easily.  Psychiatric/Behavioral: Negative.    Per HPI unless specifically indicated above     Objective:    BP (!) 155/66   Pulse 80   Temp 98.7 F (37.1 C)   Wt 156 lb (70.8 kg)   SpO2 98%   BMI 27.63 kg/m   Wt Readings from Last 3 Encounters:  07/01/17 156 lb (70.8 kg)  06/27/17 155 lb (70.3 kg)  06/22/17 155 lb (70.3 kg)    Physical Exam  Constitutional: He is oriented to person, place, and time. He appears well-developed and well-nourished. No distress.  HENT:  Head: Atraumatic.  Eyes: Pupils are equal, round, and reactive to light. Conjunctivae are normal.  Neck: Normal range of motion. Neck supple.  Cardiovascular: Normal rate.   Pulmonary/Chest: Effort normal and breath sounds normal. No respiratory distress.  Musculoskeletal: Normal range of motion.  Neurological: He is alert and oriented to person, place, and  time. He has normal reflexes.  Skin: Skin is warm and dry.  Left forearm wound continues to improve, healing well with no evidence of infection  Psychiatric: He has a normal mood and affect. His behavior is normal.  Nursing note and vitals reviewed.  Results for orders placed or performed in visit on 06/27/17  CoaguChek XS/INR Waived  Result Value Ref Range   INR 2.4 (H) 0.9 - 1.1   Prothrombin Time 28.4 sec      Assessment & Plan:   Problem List Items Addressed This Visit      Cardiovascular and Mediastinum   Atrial fibrillation ([place]) - Primary (Chronic)    INR at goal today at 2.6. Continue current regimen. F/u in 2 weeks for INR recheck      Relevant Orders   CoaguChek XS/INR Waived     Other   History of prosthetic heart valve (Chronic)   Relevant Orders   CoaguChek XS/INR Waived    Other Visit Diagnoses    Skin abrasion       Wound irrigated with saline and dressed with antibiotic ointment, nonstick gauze, and ace wrap. F/u in 4 days for re-dress       Follow up plan: Return in about 4 days (around 07/05/2017) for Wound check.

## 2017-07-01 NOTE — Assessment & Plan Note (Signed)
INR at goal today at 2.6. Continue current regimen. F/u in 2 weeks for INR recheck

## 2017-07-05 ENCOUNTER — Encounter: Payer: Self-pay | Admitting: Family Medicine

## 2017-07-05 ENCOUNTER — Ambulatory Visit (INDEPENDENT_AMBULATORY_CARE_PROVIDER_SITE_OTHER): Payer: Medicare Other | Admitting: Family Medicine

## 2017-07-05 VITALS — BP 158/75 | HR 85 | Temp 98.4°F | Wt 159.0 lb

## 2017-07-05 DIAGNOSIS — I639 Cerebral infarction, unspecified: Secondary | ICD-10-CM

## 2017-07-05 DIAGNOSIS — T148XXA Other injury of unspecified body region, initial encounter: Secondary | ICD-10-CM

## 2017-07-05 NOTE — Progress Notes (Signed)
   BP (!) 158/75   Pulse 85   Temp 98.4 F (36.9 C)   Wt 159 lb (72.1 kg)   SpO2 99%   BMI 28.17 kg/m    Subjective:    [name], male    DOB: 07/25/1941, 76 y.o.   MRN: [id number]  HPI: Kirk Jones is a 76 y.o. male  Chief Complaint  Patient presents with  . Wound Check    doing much better   Patient presents today for wound follow up for left arm abrasion. Doing well, no pain, fevers, drainage, bleeding or bruising issues. Dressings remain in place from previous visit.   Relevant past medical, surgical, family and social history reviewed and updated as indicated. Interim medical history since our last visit reviewed. Allergies and medications reviewed and updated.  Review of Systems  Constitutional: Negative.   Respiratory: Negative.   Cardiovascular: Negative.   Musculoskeletal: Negative.   Skin: Positive for wound.  Neurological: Negative.   Psychiatric/Behavioral: Negative.    Per HPI unless specifically indicated above     Objective:    BP (!) 158/75   Pulse 85   Temp 98.4 F (36.9 C)   Wt 159 lb (72.1 kg)   SpO2 99%   BMI 28.17 kg/m   Wt Readings from Last 3 Encounters:  07/05/17 159 lb (72.1 kg)  07/01/17 156 lb (70.8 kg)  06/27/17 155 lb (70.3 kg)    Physical Exam  Constitutional: He is oriented to person, place, and time. He appears well-developed and well-nourished. No distress.  HENT:  Head: Atraumatic.  Eyes: Pupils are equal, round, and reactive to light. Conjunctivae are normal.  Neck: Normal range of motion. Neck supple.  Cardiovascular: Normal rate.   Pulmonary/Chest: Effort normal and breath sounds normal. No respiratory distress.  Musculoskeletal: Normal range of motion.  Neurological: He is alert and oriented to person, place, and time.  Skin: Skin is warm and dry.  Left forearm abrasion healing well, no evidence of infection  Psychiatric: He has a normal mood and affect. His behavior is normal.    Nursing note and vitals reviewed.   Results for orders placed or performed in visit on 07/01/17  CoaguChek XS/INR Waived  Result Value Ref Range   INR 2.6 (H) 0.9 - 1.1   Prothrombin Time 31.1 sec      Assessment & Plan:   Problem List Items Addressed This Visit    None    Visit Diagnoses    Skin abrasion    -  Primary   Left forearm healing very well, nearly closed up. Irrigated and wrapped today, supplies and instructions given for home wound care until INR f/u next week       Follow up plan: Return for as scheduled.

## 2017-07-05 NOTE — Patient Instructions (Signed)
Follow up as needed

## 2017-07-15 ENCOUNTER — Encounter: Payer: Self-pay | Admitting: Family Medicine

## 2017-07-15 ENCOUNTER — Ambulatory Visit (INDEPENDENT_AMBULATORY_CARE_PROVIDER_SITE_OTHER): Payer: Medicare Other | Admitting: Family Medicine

## 2017-07-15 VITALS — BP 141/82 | HR 106 | Temp 98.8°F | Wt 154.2 lb

## 2017-07-15 DIAGNOSIS — I482 Chronic atrial fibrillation, unspecified: Secondary | ICD-10-CM

## 2017-07-15 DIAGNOSIS — T148XXA Other injury of unspecified body region, initial encounter: Secondary | ICD-10-CM | POA: Diagnosis not present

## 2017-07-15 DIAGNOSIS — I639 Cerebral infarction, unspecified: Secondary | ICD-10-CM

## 2017-07-15 LAB — COAGUCHEK XS/INR WAIVED
INR: 3 — ABNORMAL HIGH (ref 0.9–1.1)
Prothrombin Time: 36.2 s

## 2017-07-15 NOTE — Patient Instructions (Signed)
Follow up in 1 month   

## 2017-07-15 NOTE — Progress Notes (Signed)
   BP (!) 141/82 (BP Location: Right Arm, Patient Position: Sitting, Cuff Size: Normal)   Pulse (!) 106   Temp 98.8 F (37.1 C)   Wt 154 lb 3.2 oz (69.9 kg)   SpO2 97%   BMI 27.32 kg/m    Subjective:    [name], male    DOB: 01-30-1941, 76 y.o.   MRN: [id number]  HPI: Kirk Jones is a 76 y.o. male  Chief Complaint  Patient presents with  . Wound Check  . Coagulation Disorder   Patient presents for 2 week f/u INR and wound check for left forearm abrasion. Doing well, no concerns today. Alternating 3.5 mg and 4 mg with his coumadin. No bleeding or bruising issues. No new medication changes, dietary changes. Has been doing home wound care as directed with no concerns there. Denies fever, chills, N/V, fatigue.  Relevant past medical, surgical, family and social history reviewed and updated as indicated. Interim medical history since our last visit reviewed. Allergies and medications reviewed and updated.  Review of Systems  Constitutional: Negative.   HENT: Negative.   Respiratory: Negative.   Cardiovascular: Negative.   Musculoskeletal: Negative.   Skin: Positive for wound.  Neurological: Negative.   Hematological: Does not bruise/bleed easily.  Psychiatric/Behavioral: Negative.    Per HPI unless specifically indicated above     Objective:    BP (!) 141/82 (BP Location: Right Arm, Patient Position: Sitting, Cuff Size: Normal)   Pulse (!) 106   Temp 98.8 F (37.1 C)   Wt 154 lb 3.2 oz (69.9 kg)   SpO2 97%   BMI 27.32 kg/m   Wt Readings from Last 3 Encounters:  07/15/17 154 lb 3.2 oz (69.9 kg)  07/05/17 159 lb (72.1 kg)  07/01/17 156 lb (70.8 kg)    Physical Exam  Constitutional: He is oriented to person, place, and time. He appears well-developed and well-nourished. No distress.  HENT:  Head: Atraumatic.  Eyes: Pupils are equal, round, and reactive to light. Conjunctivae are normal.  Neck: Normal range of motion. Neck supple.    Cardiovascular: Normal rate.   Pulmonary/Chest: Effort normal. No respiratory distress.  Musculoskeletal: Normal range of motion.  Neurological: He is alert and oriented to person, place, and time.  Skin: Skin is warm and dry.  Left forearm abrasion continues to heal well, no evidence of infection  Psychiatric: He has a normal mood and affect. His behavior is normal.  Nursing note and vitals reviewed.  Results for orders placed or performed in visit on 07/01/17  CoaguChek XS/INR Waived  Result Value Ref Range   INR 2.6 (H) 0.9 - 1.1   Prothrombin Time 31.1 sec      Assessment & Plan:   Problem List Items Addressed This Visit      Cardiovascular and Mediastinum   Atrial fibrillation ([place]) - Primary (Chronic)    INR today at goal at 3.0. Continue current regimen of 3.5 mg and 4 mg alternating. F/u in 1 month for recheck      Relevant Orders   CoaguChek XS/INR Waived    Other Visit Diagnoses    Skin abrasion       Wound irrigated with sterile saline and dressed with antibiotic ointment and nonstick gauze, wrapped with ace wrap. Continue home wound care      Follow up plan: Return in about 4 weeks (around 08/12/2017) for INR.

## 2017-07-15 NOTE — Assessment & Plan Note (Signed)
INR today at goal at 3.0. Continue current regimen of 3.5 mg and 4 mg alternating. F/u in 1 month for recheck

## 2017-07-22 ENCOUNTER — Ambulatory Visit (INDEPENDENT_AMBULATORY_CARE_PROVIDER_SITE_OTHER): Payer: Medicare Other | Admitting: Family Medicine

## 2017-07-22 DIAGNOSIS — T148XXA Other injury of unspecified body region, initial encounter: Secondary | ICD-10-CM

## 2017-07-22 NOTE — Progress Notes (Signed)
Wound healing well. Bandaged changed and wound dressed with triple antibiotic ointment and coban. Call with any concerns.

## 2017-07-27 ENCOUNTER — Ambulatory Visit (INDEPENDENT_AMBULATORY_CARE_PROVIDER_SITE_OTHER): Payer: Medicare Other | Admitting: Family Medicine

## 2017-07-27 ENCOUNTER — Encounter: Payer: Self-pay | Admitting: Family Medicine

## 2017-07-27 VITALS — BP 155/101 | HR 82 | Wt 163.0 lb

## 2017-07-27 DIAGNOSIS — T148XXA Other injury of unspecified body region, initial encounter: Secondary | ICD-10-CM

## 2017-07-27 NOTE — Progress Notes (Signed)
   BP (!) 155/101   Pulse 82   Wt 163 lb (73.9 kg)   SpO2 98%   BMI 28.87 kg/m    Subjective:    [name], male    DOB: 1941-05-26, 76 y.o.   MRN: [id number]  HPI: Kirk Jones is a 76 y.o. male  Chief Complaint  Patient presents with  . Follow-up  . Dressing Change   Patient presents today for dressing change. Wounds healing well with no issues or pain. Had been doing home dressings but wife has been placed into a rehab facility so he does not have help to change dressings currently.   Relevant past medical, surgical, family and social history reviewed and updated as indicated. Interim medical history since our last visit reviewed. Allergies and medications reviewed and updated.  Review of Systems  Per HPI unless specifically indicated above     Objective:    BP (!) 155/101   Pulse 82   Wt 163 lb (73.9 kg)   SpO2 98%   BMI 28.87 kg/m   Wt Readings from Last 3 Encounters:  07/27/17 163 lb (73.9 kg)  07/15/17 154 lb 3.2 oz (69.9 kg)  07/05/17 159 lb (72.1 kg)    Physical Exam  Results for orders placed or performed in visit on 07/15/17  CoaguChek XS/INR Waived  Result Value Ref Range   INR 3.0 (H) 0.9 - 1.1   Prothrombin Time 36.2 sec      Assessment & Plan:   Problem List Items Addressed This Visit    None    Visit Diagnoses    Abrasion    -  Primary   Left forearm abrasion continues to heal well. Irrigated with sterile saline, dressed with neosporin, and covered with nonstick gauze and wrap. F/u in 1 week       Follow up plan: Return in about 1 week (around 08/03/2017) for Wound check.

## 2017-07-28 ENCOUNTER — Other Ambulatory Visit: Payer: Self-pay | Admitting: Family Medicine

## 2017-07-28 DIAGNOSIS — N183 Chronic kidney disease, stage 3 (moderate): Secondary | ICD-10-CM | POA: Diagnosis not present

## 2017-07-28 DIAGNOSIS — Z9861 Coronary angioplasty status: Secondary | ICD-10-CM | POA: Diagnosis not present

## 2017-07-28 DIAGNOSIS — I10 Essential (primary) hypertension: Secondary | ICD-10-CM | POA: Diagnosis not present

## 2017-07-28 DIAGNOSIS — J449 Chronic obstructive pulmonary disease, unspecified: Secondary | ICD-10-CM | POA: Diagnosis not present

## 2017-07-28 DIAGNOSIS — Z95810 Presence of automatic (implantable) cardiac defibrillator: Secondary | ICD-10-CM | POA: Diagnosis not present

## 2017-07-28 DIAGNOSIS — I6300 Cerebral infarction due to thrombosis of unspecified precerebral artery: Secondary | ICD-10-CM | POA: Diagnosis not present

## 2017-07-28 DIAGNOSIS — I480 Paroxysmal atrial fibrillation: Secondary | ICD-10-CM | POA: Diagnosis not present

## 2017-07-28 DIAGNOSIS — I472 Ventricular tachycardia: Secondary | ICD-10-CM | POA: Diagnosis not present

## 2017-07-28 DIAGNOSIS — I5022 Chronic systolic (congestive) heart failure: Secondary | ICD-10-CM | POA: Diagnosis not present

## 2017-07-28 DIAGNOSIS — I2510 Atherosclerotic heart disease of native coronary artery without angina pectoris: Secondary | ICD-10-CM | POA: Diagnosis not present

## 2017-07-28 DIAGNOSIS — I5032 Chronic diastolic (congestive) heart failure: Secondary | ICD-10-CM | POA: Diagnosis not present

## 2017-07-28 DIAGNOSIS — Z952 Presence of prosthetic heart valve: Secondary | ICD-10-CM | POA: Diagnosis not present

## 2017-07-28 NOTE — Patient Instructions (Signed)
Follow up for dressing change

## 2017-08-04 ENCOUNTER — Ambulatory Visit (INDEPENDENT_AMBULATORY_CARE_PROVIDER_SITE_OTHER): Payer: Medicare Other

## 2017-08-04 DIAGNOSIS — T148XXA Other injury of unspecified body region, initial encounter: Secondary | ICD-10-CM

## 2017-08-11 ENCOUNTER — Ambulatory Visit (INDEPENDENT_AMBULATORY_CARE_PROVIDER_SITE_OTHER): Payer: Medicare Other | Admitting: Family Medicine

## 2017-08-11 ENCOUNTER — Encounter: Payer: Self-pay | Admitting: Family Medicine

## 2017-08-11 VITALS — BP 126/77 | HR 97 | Temp 98.4°F | Wt 156.6 lb

## 2017-08-11 DIAGNOSIS — Z952 Presence of prosthetic heart valve: Secondary | ICD-10-CM

## 2017-08-11 DIAGNOSIS — T148XXA Other injury of unspecified body region, initial encounter: Secondary | ICD-10-CM | POA: Diagnosis not present

## 2017-08-11 DIAGNOSIS — K4090 Unilateral inguinal hernia, without obstruction or gangrene, not specified as recurrent: Secondary | ICD-10-CM

## 2017-08-11 DIAGNOSIS — I482 Chronic atrial fibrillation, unspecified: Secondary | ICD-10-CM

## 2017-08-11 DIAGNOSIS — I639 Cerebral infarction, unspecified: Secondary | ICD-10-CM

## 2017-08-11 LAB — COAGUCHEK XS/INR WAIVED
INR: 2.5 — ABNORMAL HIGH (ref 0.9–1.1)
Prothrombin Time: 29.9 s

## 2017-08-11 NOTE — Progress Notes (Signed)
BP 126/77   Pulse 97   Temp 98.4 F (36.9 C)   Wt 156 lb 9.6 oz (71 kg)   SpO2 95%   BMI 27.74 kg/m    Subjective:    [name], male    DOB: 09-Jun-1941, 76 y.o.   MRN: [id number]  HPI: Kirk Jones is a 76 y.o. male  Chief Complaint  Patient presents with  . Atrial Fibrillation    alternating 3.5 and 4 mg of warfarin  . Wound Check   Patient presents today for repeat INR and wound care on left forearm skin abrasion. Alternating 3.5 and 4 mg tablets of coumadin with no bleeding or bruising issues. Diet stable, no new medications.   Abrasion continues to heal well, keeping covered and applying neosporin regularly. No redness, warmth, drainage, fever, chills.   Pt notes he has a right inguinal hernia that has been monitored for a while now but is becoming increasingly symptomatic the past 10 days or so. Having to reduce the area often, sometimes by laying down on the bed and relaxing everything fully. Wanting to know if he should go forward with a repair or leave it be.   Past Medical History:  Diagnosis Date  . Aortic valve regurgitation   . Atrial fibrillation ([place])   . Barrett's esophagus   . COPD (chronic obstructive pulmonary disease) ([place])   . Diverticulosis   . Gout   . Hematuria   . History of prosthetic heart valve   . Hyperlipidemia   . Hypertension   . Nodular prostate with urinary obstruction   . Substance abuse    alcohol   Social History   Social History  . Marital status: Married    Spouse name: N/A  . Number of children: N/A  . Years of education: N/A   Occupational History  . Not on file.   Social History Main Topics  . Smoking status: Former Smoker    Types: Cigarettes    Quit date: 04/24/1991  . Smokeless tobacco: Never Used  . Alcohol use Yes     Comment: quit December 2015/restarted  . Drug use: No  . Sexual activity: No   Other Topics Concern  . Not on file   Social History Narrative  . No narrative  on file   Relevant past medical, surgical, family and social history reviewed and updated as indicated. Interim medical history since our last visit reviewed. Allergies and medications reviewed and updated.  Review of Systems  Constitutional: Negative.   HENT: Negative.   Respiratory: Negative.   Cardiovascular: Negative.   Gastrointestinal:       Hernia  Musculoskeletal: Negative.   Skin: Positive for wound.  Neurological: Negative.   Hematological: Does not bruise/bleed easily.  Psychiatric/Behavioral: Negative.    Per HPI unless specifically indicated above     Objective:    BP 126/77   Pulse 97   Temp 98.4 F (36.9 C)   Wt 156 lb 9.6 oz (71 kg)   SpO2 95%   BMI 27.74 kg/m   Wt Readings from Last 3 Encounters:  08/11/17 156 lb 9.6 oz (71 kg)  07/27/17 163 lb (73.9 kg)  07/15/17 154 lb 3.2 oz (69.9 kg)    Physical Exam  Constitutional: He is oriented to person, place, and time. He appears well-developed and well-nourished. No distress.  HENT:  Head: Atraumatic.  Eyes: Pupils are equal, round, and reactive to light. Conjunctivae are normal.  Neck: Normal range  of motion. Neck supple.  Cardiovascular: Normal rate.   Pulmonary/Chest: Effort normal and breath sounds normal.  Abdominal: Soft. Bowel sounds are normal. There is no guarding. A hernia is present. Hernia confirmed positive in the right inguinal area (reducible).  Musculoskeletal: Normal range of motion.  Neurological: He is alert and oriented to person, place, and time.  Skin: Skin is warm and dry.  Left forearm abrasion well healed, covered in flaky dried skin. No evidence of infection  Psychiatric: He has a normal mood and affect. His behavior is normal.  Nursing note and vitals reviewed.  Results for orders placed or performed in visit on 07/15/17  CoaguChek XS/INR Waived  Result Value Ref Range   INR 3.0 (H) 0.9 - 1.1   Prothrombin Time 36.2 sec      Assessment & Plan:   Problem List Items  Addressed This Visit      Cardiovascular and Mediastinum   Atrial fibrillation ([place]) - Primary (Chronic)   Relevant Orders   CoaguChek XS/INR Waived     Other   History of prosthetic heart valve (Chronic)    INR stable today at 2.5 on 3.5 mg and 4 mg alternating coumadin. Continue current regimen       Other Visit Diagnoses    Right inguinal hernia       Discomfort worsening, having to reduce the mass frequently the past week or so. Surgery referral placed. Strict return precautions reviewed   Relevant Orders   Ambulatory referral to General Surgery   Skin abrasion       Wound irrigated with saline and dressed with antibiotic ointment. Ok to keep unwrapped from here on out. Return precautions reviewed       Follow up plan: Return in about 4 weeks (around 09/08/2017) for INR.

## 2017-08-11 NOTE — Assessment & Plan Note (Signed)
INR stable today at 2.5 on 3.5 mg and 4 mg alternating coumadin. Continue current regimen

## 2017-08-11 NOTE — Patient Instructions (Signed)
Follow up in 1 month   

## 2017-08-12 ENCOUNTER — Ambulatory Visit: Payer: Medicare Other | Admitting: Family Medicine

## 2017-08-16 ENCOUNTER — Ambulatory Visit (INDEPENDENT_AMBULATORY_CARE_PROVIDER_SITE_OTHER): Payer: Medicare Other | Admitting: General Surgery

## 2017-08-16 ENCOUNTER — Encounter: Payer: Self-pay | Admitting: General Surgery

## 2017-08-16 VITALS — BP 118/72 | HR 88 | Resp 16 | Ht 63.0 in | Wt 154.0 lb

## 2017-08-16 DIAGNOSIS — I639 Cerebral infarction, unspecified: Secondary | ICD-10-CM

## 2017-08-16 DIAGNOSIS — K4090 Unilateral inguinal hernia, without obstruction or gangrene, not specified as recurrent: Secondary | ICD-10-CM | POA: Diagnosis not present

## 2017-08-16 DIAGNOSIS — Z952 Presence of prosthetic heart valve: Secondary | ICD-10-CM | POA: Diagnosis not present

## 2017-08-16 DIAGNOSIS — Z95810 Presence of automatic (implantable) cardiac defibrillator: Secondary | ICD-10-CM

## 2017-08-16 DIAGNOSIS — I4891 Unspecified atrial fibrillation: Secondary | ICD-10-CM

## 2017-08-16 NOTE — Progress Notes (Signed)
[name] [facility], male   DOB: 10/07/1941, 76 y.o.   MRN: [id number]  Chief Complaint  Patient presents with  . Other    HPI Kirk Jones is a 76 y.o. male here today for a evaluation of a right inguinal hernia. Patient noticed this area about a year ago. In the last two week the area has been swollen and he is becoming increasingly symptomatic upon exertion. Denies history of prior hernias. He has a history of Afib, a prosthetic heart valve replacement and a pacemaker and defibrillator and is on Coumadin, being closely monitored by PCP and cardiology. Last INR 2.5 5 days ago. Cardiologist is Dr. Saralyn Pilar, last visit on 07/28/2017.   HPI  Past Medical History:  Diagnosis Date  . Aortic valve regurgitation   . Atrial fibrillation ([place])   . Barrett's esophagus   . COPD (chronic obstructive pulmonary disease) ([place])   . Diverticulosis   . Gout   . Hematuria   . History of prosthetic heart valve   . Hyperlipidemia   . Hypertension   . Nodular prostate with urinary obstruction   . Substance abuse    alcohol    Past Surgical History:  Procedure Laterality Date  . CARDIAC VALVE REPLACEMENT N/A 2004  . CARPAL TUNNEL RELEASE Right 2015  . CATARACT EXTRACTION Right 2017  . COLONOSCOPY WITH PROPOFOL N/A 2014  . VASECTOMY      Family History  Problem Relation Age of Onset  . Heart attack Mother   . Diabetes Maternal Grandmother   . Cancer Sister        lung    Social History Social History  Substance Use Topics  . Smoking status: Former Smoker    Types: Cigarettes    Quit date: 04/24/1991  . Smokeless tobacco: Never Used  . Alcohol use Yes     Comment: quit December 2015/restarted    Allergies  Allergen Reactions  . Prednisone Other (See Comments)    Reaction: "organs shutting down"    Current Outpatient Prescriptions  Medication Sig Dispense Refill  . acetaminophen (TYLENOL) 500 MG tablet Take 500 mg by mouth 2 (two) times daily.     Marland Kitchen aspirin EC  81 MG tablet Take 81 mg by mouth daily.    . colchicine 0.6 MG tablet Take 1 tablet (0.6 mg total) by mouth daily. (Patient taking differently: Take 0.6 mg by mouth daily as needed (gout). ) 30 tablet 1  . diltiazem (DILACOR XR) 180 MG 24 hr capsule TAKE 1 CAPSULE BY MOUTH ONCE DAILY 90 capsule 1  . empagliflozin (JARDIANCE) 10 MG TABS tablet Take 10 mg by mouth daily.    . febuxostat (ULORIC) 40 MG tablet Take 1 tablet (40 mg total) by mouth daily. 90 tablet 3  . ferrous sulfate 325 (65 FE) MG tablet Take 325 mg by mouth once a week. Patient takes on Wednesday    . fexofenadine (ALLEGRA) 180 MG tablet Take 180 mg by mouth daily.    . finasteride (PROSCAR) 5 MG tablet Take 5 mg by mouth daily.    . fluticasone (FLONASE) 50 MCG/ACT nasal spray Place 2 sprays into both nostrils daily. (Patient taking differently: Place 1 spray into both nostrils daily. ) 16 g 12  . furosemide (LASIX) 40 MG tablet Take 40 mg by mouth daily.     Marland Kitchen ketoconazole (NIZORAL) 2 % shampoo Apply 1 application topically as needed for irritation.    Marland Kitchen losartan (COZAAR) 25 MG tablet Take 1  tablet (25 mg total) by mouth daily. 90 tablet 4  . magnesium 30 MG tablet Take 400 mg by mouth daily.     . metoprolol succinate (TOPROL-XL) 50 MG 24 hr tablet Take 1 tablet (50 mg total) by mouth 2 (two) times daily. Take with or immediately following a meal. 180 tablet 4  . Multiple Vitamins-Minerals (CENTRUM ADULTS PO) Take 1 tablet by mouth daily.     Marland Kitchen omeprazole (PRILOSEC) 20 MG capsule Take 1 capsule (20 mg total) by mouth 2 (two) times daily before a meal. 180 capsule 4  . tamsulosin (FLOMAX) 0.4 MG CAPS capsule Take 0.4 mg by mouth daily.    Marland Kitchen tiotropium (SPIRIVA) 18 MCG inhalation capsule Place 1 capsule (18 mcg total) into inhaler and inhale daily. (Patient taking differently: Place 18 mcg into inhaler and inhale daily as needed (chest congestion). ) 90 capsule 4  . warfarin (COUMADIN) 4 MG tablet Take 1 tablet (4 mg total) by mouth  daily at 6 PM. 25 tablet 0  . enoxaparin (LOVENOX) 80 MG/0.8ML injection Inject 80 mg into the skin as needed.     No current facility-administered medications for this visit.     Review of Systems Review of Systems  Constitutional: Negative.   Respiratory: Negative.   Cardiovascular: Negative.     Blood pressure 118/72, pulse 88, resp. rate 16, height 5\' 3"  (1.6 m), weight 154 lb (69.9 kg).  Physical Exam Physical Exam  Constitutional: He is oriented to person, place, and time. He appears well-developed and well-nourished.  Eyes: Conjunctivae are normal. No scleral icterus.  Neck: Neck supple. No thyromegaly present.  Cardiovascular: Normal rate and normal heart sounds.  An irregular rhythm present.  Pulmonary/Chest: Effort normal and breath sounds normal.  Abdominal: Soft. Normal appearance and bowel sounds are normal. There is no hepatomegaly. There is no tenderness. A hernia is present. Hernia confirmed positive in the right inguinal area.    Lymphadenopathy:    He has no cervical adenopathy.  Neurological: He is alert and oriented to person, place, and time.  Skin: Skin is warm and dry.    Data Reviewed Notes reviewed   Assessment    Right inguinal hernia- large. Reducible. By history it is symptomatic of recent with enlargement.    Plan   If pt is stable from cardiac status operative repair can be achieved with local anesthesia and sedation. Will consult his cardiologist, Dr. Saralyn Pilar, for cardiac clearance and for perioperative anticoagulation management. Patient will need bridge therapy with Lovenox for the operation and cardiology's assistance will be needed for dosing management.   Hernia precautions and incarceration were discussed with the patient. If they develop symptoms of an incarcerated hernia, they were encouraged to seek prompt medical attention.  I have recommended repair of the hernia using mesh on an outpatient basis in the near future. The risk of  infection was reviewed. The role of prosthetic mesh to minimize the risk of recurrence was reviewed.  HPI, Physical Exam, Assessment and Plan have been scribed under the direction and in the presence of Mckinley Jewel, MD  Kirk Jones, CMA  The patient is scheduled for surgery at [facility] on 09/12/17. He will pre admit at the hospital. He will need Cardiac Clearance as well as Lovenox bridge therapy. This will be requested thru Dr Saralyn Pilar office. The patient is aware of date and instructions.  Documented by Lesly Rubenstein LPN    I have completed the exam and reviewed the above documentation  for accuracy and completeness.  I agree with the above.  Haematologist has been used and any errors in dictation or transcription are unintentional.  Shaquon Gropp G. Jamal Collin, M.D., F.A.C.S.   Junie Panning G 08/16/2017, 3:00 PM

## 2017-08-16 NOTE — Patient Instructions (Addendum)
Inguinal Hernia, Adult An inguinal hernia is when fat or the intestines push through the area where the leg meets the lower belly (groin) and make a rounded lump (bulge). This condition happens over time. There are three types of inguinal hernias. These types include:  Hernias that can be pushed back into the belly (are reducible).  Hernias that cannot be pushed back into the belly (are incarcerated).  Hernias that cannot be pushed back into the belly and lose their blood supply (get strangulated). This type needs emergency surgery.  Follow these instructions at home: Lifestyle  Drink enough fluid to keep your urine (pee) clear or pale yellow.  Eat plenty of fruits, vegetables, and whole grains. These have a lot of fiber. Talk with your doctor if you have questions.  Avoid lifting heavy objects.  Avoid standing for long periods of time.  Do not use tobacco products. These include cigarettes, chewing tobacco, or e-cigarettes. If you need help quitting, ask your doctor.  Try to stay at a healthy weight. General instructions  Do not try to force the hernia back in.  Watch your hernia for any changes in color or size. Let your doctor know if there are any changes.  Take over-the-counter and prescription medicines only as told by your doctor.  Keep all follow-up visits as told by your doctor. This is important. Contact a doctor if:  You have a fever.  You have new symptoms.  Your symptoms get worse. Get help right away if:  The area where the legs meets the lower belly has: ? Pain that gets worse suddenly. ? A bulge that gets bigger suddenly and does not go down. ? A bulge that turns red or purple. ? A bulge that is painful to the touch.  You are a man and your scrotum: ? Suddenly feels painful. ? Suddenly changes in size.  You feel sick to your stomach (nauseous) and this feeling does not go away.  You throw up (vomit) and this keeps happening.  You feel your heart  beating a lot more quickly than normal.  You cannot poop (have a bowel movement) or pass gas. This information is not intended to replace advice given to you by your health care provider. Make sure you discuss any questions you have with your health care provider. Document Released: 12/16/2006 Document Revised: 04/22/2016 Document Reviewed: 09/25/2014 Elsevier Interactive Patient Education  Kirk Jones.  If pt is stable from cardiac status operative repair can be achieved with local anesthesia and sedation. Will consult his cardiologist, Dr. Saralyn Pilar, for cardiac clearance and for perioperative anticoagulation management.   The patient is scheduled for surgery at [facility] on 09/12/17. He will pre admit at the hospital. He will need Cardiac Clearance as well as Lovenox bridge therapy. This will be requested thru Dr Saralyn Pilar office. The patient is aware of date and instructions.

## 2017-08-17 ENCOUNTER — Telehealth: Payer: Self-pay | Admitting: *Deleted

## 2017-08-17 NOTE — Telephone Encounter (Signed)
Message left on home number for patient to call the office.   We need to inform patient of Pre-admission Testing appointment date and time.

## 2017-08-18 NOTE — Telephone Encounter (Signed)
Another message left on home and cell numbers.   Also, need to inform patient that Dr. Saralyn Pilar' office is aware of the need for cardiac clearance and bridging therapy with Lovenox. Their office is working on and will be in touch with the patient.

## 2017-08-18 NOTE — Telephone Encounter (Signed)
Patient called the office back and was notified as instructed. He verbalizes understanding.

## 2017-08-19 NOTE — Telephone Encounter (Signed)
Message left for patient to make sure he is aware of his Lovenox instructions from Dr Saralyn Pilar office

## 2017-08-24 NOTE — Telephone Encounter (Signed)
Spoke with the patient today and he is aware of Lovenox instructions from Dr. Saralyn Pilar' office.

## 2017-08-29 ENCOUNTER — Other Ambulatory Visit: Payer: Self-pay

## 2017-08-29 MED ORDER — WARFARIN SODIUM 4 MG PO TABS: 4.0000 mg | ORAL_TABLET | Freq: Every day | ORAL | 0 refills | Status: DC

## 2017-08-29 NOTE — Telephone Encounter (Signed)
Routing to provider. Appt on 09/09/17.

## 2017-09-01 ENCOUNTER — Encounter
Admission: RE | Admit: 2017-09-01 | Discharge: 2017-09-01 | Disposition: A | Discharge status: Home / Self Care | Payer: Medicare Other | Source: Ambulatory Visit | Attending: General Surgery | Admitting: General Surgery

## 2017-09-01 ENCOUNTER — Other Ambulatory Visit: Payer: Self-pay | Admitting: General Surgery

## 2017-09-01 DIAGNOSIS — Z01812 Encounter for preprocedural laboratory examination: Secondary | ICD-10-CM | POA: Diagnosis not present

## 2017-09-01 DIAGNOSIS — K4090 Unilateral inguinal hernia, without obstruction or gangrene, not specified as recurrent: Secondary | ICD-10-CM | POA: Diagnosis not present

## 2017-09-01 HISTORY — DX: Headache: R51

## 2017-09-01 HISTORY — DX: Headache, unspecified: R51.9

## 2017-09-01 LAB — BASIC METABOLIC PANEL
ANION GAP: 12 (ref 5–15)
BUN: 24 mg/dL — ABNORMAL HIGH (ref 6–20)
CO2: 30 mmol/L (ref 22–32)
Calcium: 9.5 mg/dL (ref 8.9–10.3)
Chloride: 94 mmol/L — ABNORMAL LOW (ref 101–111)
Creatinine, Ser: 1.06 mg/dL (ref 0.61–1.24)
GFR calc Af Amer: >60 mL/min (ref >60)
Glucose, Bld: 98 mg/dL (ref 65–99)
POTASSIUM: 4.5 mmol/L (ref 3.5–5.1)
SODIUM: 136 mmol/L (ref 135–145)

## 2017-09-01 LAB — CBC
HEMATOCRIT: 43 % (ref 40.0–52.0)
Hemoglobin: 14.5 g/dL (ref 13.0–18.0)
MCH: 34.6 pg — ABNORMAL HIGH (ref 26.0–34.0)
MCHC: 33.7 g/dL (ref 32.0–36.0)
MCV: 102.5 fL — ABNORMAL HIGH (ref 80.0–100.0)
PLATELETS: 212 10*3/uL (ref 150–440)
RBC: 4.2 MIL/uL — AB (ref 4.40–5.90)
RDW: 14 % (ref 11.5–14.5)
WBC: 7.5 10*3/uL (ref 3.8–10.6)

## 2017-09-01 LAB — DIFFERENTIAL
BASOS ABS: 0.1 10*3/uL (ref 0–0.1)
BASOS PCT: 1 %
Eosinophils Absolute: 0.1 10*3/uL (ref 0–0.7)
Eosinophils Relative: 1 %
Lymphocytes Relative: 12 %
Lymphs Abs: 0.9 10*3/uL — ABNORMAL LOW (ref 1.0–3.6)
MONOS PCT: 10 %
Monocytes Absolute: 0.8 10*3/uL (ref 0.2–1.0)
NEUTROS ABS: 5.7 10*3/uL (ref 1.4–6.5)
Neutrophils Relative %: 76 %

## 2017-09-01 NOTE — Patient Instructions (Signed)
Your procedure is scheduled on: Mon. 09/12/17 Report to Day Surgery. To find out your arrival time please call [phone] between 1PM - 3PM on Friday 09/10/17 .  Remember: Instructions that are not followed completely may result in serious medical risk, up to and including death, or upon the discretion of your surgeon and anesthesiologist your surgery may need to be rescheduled.     _X__ 1. Do not eat food after midnight the night before your procedure.                 No gum chewing or hard candies. You may drink clear liquids up to 2 hours                 before you are scheduled to arrive for your surgery- DO not drink clear                 liquids within 2 hours of the start of your surgery.                 Clear Liquids include:  water, apple juice without pulp, clear carbohydrate                 drink such as Clearfast of Gartorade, Black Coffee or Tea (Do not add                 anything to coffee or tea).     _X__ 2.  No Alcohol for 24 hours before or after surgery.   ___ 3.  Do Not Smoke or use e-cigarettes For 24 Hours Prior to Your Surgery.                 Do not use any chewable tobacco products for at least 6 hours prior to                 surgery.  ____  4.  Bring all medications with you on the day of surgery if instructed.   __x__  5.  Notify your doctor if there is any change in your medical condition      (cold, fever, infections).     Do not wear jewelry, make-up, hairpins, clips or nail polish. Do not wear lotions, powders, or perfumes. You may wear deodorant. Do not shave 48 hours prior to surgery. Men may shave face and neck. Do not bring valuables to the hospital.    [facility] is not responsible for any belongings or valuables.  Contacts, dentures or bridgework may not be worn into surgery. Leave your suitcase in the car. After surgery it may be brought to your room. For patients admitted to the hospital, discharge time is  determined by your treatment team.   Patients discharged the day of surgery will not be allowed to drive home.   Please read over the following fact sheets that you were given:    __x__ Take these medicines the morning of surgery with A SIP OF WATER:    1. acetaminophen (TYLENOL) 650 MG CR tablet if needed  2. albuterol (PROVENTIL HFA;VENTOLIN HFA) 108 (90 Base) MCG/ACT inhaler and bring to hospital  3. tiotropium (SPIRIVA) 18 MCG inhalation capsule  4.fluticasone (FLONASE) 50 MCG/ACT nasal spray if needed              5.metoprolol succinate (TOPROL-XL) 50 MG 24 hr tablet              6.omeprazole (PRILOSEC) 20 MG capsule extra dose in morning ____  Fleet Enema (as directed)   __x__ Use CHG Soap as directed  __x__ Use inhalers on the day of surgery  ____ Stop metformin 2 days prior to surgery    ____ Take 1/2 of usual insulin dose the night before surgery. No insulin the morning          of surgery.   __x__ Stop aspirin on per cardiologist recommendation  ____ Stop Anti-inflammatories on    ____ Stop supplements until after surgery.    ____ Bring C-Pap to the hospital.

## 2017-09-02 NOTE — Pre-Procedure Instructions (Signed)
CBC and Met B results sent to Dr. Jamal Collin and Anesthesia for review.

## 2017-09-06 ENCOUNTER — Telehealth: Payer: Self-pay | Admitting: General Surgery

## 2017-09-06 NOTE — Telephone Encounter (Signed)
PATIENT CALLED STATING HE HAD HIS PRE-OP FOR SURGERY ON 09-12-17 WITH DR Jamal Collin.THEY ASK HIM IF HE HAD SOMEONE AT HOME TO HELP TAKE CARE OF HIM & HE SAID YES. AFTER FURTHER THOUGHT HIS WIFE USES  A WALKER & WILL BE UNABLE TO ASSIST HIM. PLEASE ADVISE.

## 2017-09-07 NOTE — Telephone Encounter (Signed)
Spoke with patient's wife this morning and notified her that [facility] will be calling them today or tomorrow to see if they can provide their services to them. She verbalizes understanding.

## 2017-09-09 ENCOUNTER — Ambulatory Visit: Payer: Medicare Other | Admitting: Family Medicine

## 2017-09-12 ENCOUNTER — Ambulatory Visit
Admission: RE | Admit: 2017-09-12 | Discharge: 2017-09-12 | Disposition: A | Discharge status: Home / Self Care | Payer: Medicare Other | Source: Ambulatory Visit | Attending: General Surgery | Admitting: General Surgery

## 2017-09-12 ENCOUNTER — Telehealth: Payer: Self-pay | Admitting: *Deleted

## 2017-09-12 ENCOUNTER — Ambulatory Visit: Payer: Medicare Other | Admitting: Anesthesiology

## 2017-09-12 ENCOUNTER — Encounter
Admission: RE | Disposition: A | Discharge status: Home / Self Care | Payer: Self-pay | Source: Ambulatory Visit | Attending: General Surgery

## 2017-09-12 ENCOUNTER — Encounter: Payer: Self-pay | Admitting: *Deleted

## 2017-09-12 DIAGNOSIS — Z801 Family history of malignant neoplasm of trachea, bronchus and lung: Secondary | ICD-10-CM | POA: Insufficient documentation

## 2017-09-12 DIAGNOSIS — Z9841 Cataract extraction status, right eye: Secondary | ICD-10-CM | POA: Diagnosis not present

## 2017-09-12 DIAGNOSIS — Z5309 Procedure and treatment not carried out because of other contraindication: Secondary | ICD-10-CM | POA: Diagnosis not present

## 2017-09-12 DIAGNOSIS — Z79899 Other long term (current) drug therapy: Secondary | ICD-10-CM | POA: Insufficient documentation

## 2017-09-12 DIAGNOSIS — Z7982 Long term (current) use of aspirin: Secondary | ICD-10-CM | POA: Insufficient documentation

## 2017-09-12 DIAGNOSIS — Z7901 Long term (current) use of anticoagulants: Secondary | ICD-10-CM | POA: Insufficient documentation

## 2017-09-12 DIAGNOSIS — I351 Nonrheumatic aortic (valve) insufficiency: Secondary | ICD-10-CM | POA: Diagnosis not present

## 2017-09-12 DIAGNOSIS — Z888 Allergy status to other drugs, medicaments and biological substances status: Secondary | ICD-10-CM | POA: Insufficient documentation

## 2017-09-12 DIAGNOSIS — I4891 Unspecified atrial fibrillation: Secondary | ICD-10-CM | POA: Insufficient documentation

## 2017-09-12 DIAGNOSIS — Z87891 Personal history of nicotine dependence: Secondary | ICD-10-CM | POA: Diagnosis not present

## 2017-09-12 DIAGNOSIS — K4090 Unilateral inguinal hernia, without obstruction or gangrene, not specified as recurrent: Secondary | ICD-10-CM | POA: Diagnosis not present

## 2017-09-12 DIAGNOSIS — Z833 Family history of diabetes mellitus: Secondary | ICD-10-CM | POA: Insufficient documentation

## 2017-09-12 DIAGNOSIS — Z8249 Family history of ischemic heart disease and other diseases of the circulatory system: Secondary | ICD-10-CM | POA: Diagnosis not present

## 2017-09-12 DIAGNOSIS — I10 Essential (primary) hypertension: Secondary | ICD-10-CM | POA: Diagnosis not present

## 2017-09-12 DIAGNOSIS — J449 Chronic obstructive pulmonary disease, unspecified: Secondary | ICD-10-CM | POA: Insufficient documentation

## 2017-09-12 DIAGNOSIS — K5790 Diverticulosis of intestine, part unspecified, without perforation or abscess without bleeding: Secondary | ICD-10-CM | POA: Diagnosis not present

## 2017-09-12 DIAGNOSIS — Z952 Presence of prosthetic heart valve: Secondary | ICD-10-CM | POA: Diagnosis not present

## 2017-09-12 DIAGNOSIS — E785 Hyperlipidemia, unspecified: Secondary | ICD-10-CM | POA: Insufficient documentation

## 2017-09-12 LAB — PROTIME-INR
INR: 1.16
PROTHROMBIN TIME: 14.7 s (ref 11.4–15.2)

## 2017-09-12 SURGERY — REPAIR, HERNIA, INGUINAL, ADULT
Anesthesia: General | Laterality: Right

## 2017-09-12 MED ORDER — CHLORHEXIDINE GLUCONATE CLOTH 2 % EX PADS: 6.0000 | MEDICATED_PAD | Freq: Once | CUTANEOUS | Status: DC

## 2017-09-12 MED ORDER — PHENYLEPHRINE HCL 10 MG/ML IJ SOLN
INTRAMUSCULAR | Status: AC
  Filled 2017-09-12: qty 1

## 2017-09-12 MED ORDER — LACTATED RINGERS IV SOLN: INTRAVENOUS | Status: DC

## 2017-09-12 MED ORDER — FENTANYL CITRATE (PF) 100 MCG/2ML IJ SOLN
INTRAMUSCULAR | Status: AC
  Filled 2017-09-12: qty 2

## 2017-09-12 MED ORDER — MIDAZOLAM HCL 2 MG/2ML IJ SOLN
INTRAMUSCULAR | Status: AC
  Filled 2017-09-12: qty 2

## 2017-09-12 MED ORDER — PROPOFOL 10 MG/ML IV BOLUS
INTRAVENOUS | Status: AC
  Filled 2017-09-12: qty 40

## 2017-09-12 MED ORDER — LIDOCAINE HCL (PF) 2 % IJ SOLN
INTRAMUSCULAR | Status: AC
  Filled 2017-09-12: qty 4

## 2017-09-12 MED ORDER — CEFAZOLIN SODIUM-DEXTROSE 2-4 GM/100ML-% IV SOLN
INTRAVENOUS | Status: AC
  Filled 2017-09-12: qty 100

## 2017-09-12 MED ORDER — BUPIVACAINE HCL (PF) 0.5 % IJ SOLN
INTRAMUSCULAR | Status: AC
  Filled 2017-09-12: qty 30

## 2017-09-12 MED ORDER — LIDOCAINE HCL (PF) 1 % IJ SOLN
INTRAMUSCULAR | Status: AC
  Filled 2017-09-12: qty 30

## 2017-09-12 MED ORDER — CEFAZOLIN SODIUM-DEXTROSE 2-4 GM/100ML-% IV SOLN: 2.0000 g | INTRAVENOUS | Status: AC

## 2017-09-12 SURGICAL SUPPLY — 30 items
ADH SKN CLS APL DERMABOND .7 (GAUZE/BANDAGES/DRESSINGS) ×1
BLADE SURG 15 STRL SS SAFETY (BLADE) ×3 IMPLANT
CANISTER SUCT 1200ML W/VALVE (MISCELLANEOUS) ×3 IMPLANT
CHLORAPREP W/TINT 26ML (MISCELLANEOUS) ×3 IMPLANT
DECANTER SPIKE VIAL GLASS SM (MISCELLANEOUS) ×6 IMPLANT
DERMABOND ADVANCED (GAUZE/BANDAGES/DRESSINGS) ×2
DERMABOND ADVANCED .7 DNX12 (GAUZE/BANDAGES/DRESSINGS) ×1 IMPLANT
DRAIN PENROSE 1/4X12 LTX (DRAIN) ×3 IMPLANT
DRAPE LAPAROTOMY 100X77 ABD (DRAPES) ×3 IMPLANT
ELECT REM PT RETURN 9FT ADLT (ELECTROSURGICAL) ×3
ELECTRODE REM PT RTRN 9FT ADLT (ELECTROSURGICAL) ×1 IMPLANT
GLOVE BIO SURGEON STRL SZ7 (GLOVE) ×3 IMPLANT
GOWN STRL REUS W/ TWL LRG LVL3 (GOWN DISPOSABLE) ×2 IMPLANT
GOWN STRL REUS W/TWL LRG LVL3 (GOWN DISPOSABLE) ×6
KIT RM TURNOVER STRD PROC AR (KITS) ×3 IMPLANT
LABEL OR SOLS (LABEL) ×3 IMPLANT
NDL HYPO 25X1 1.5 SAFETY (NEEDLE) ×1 IMPLANT
NEEDLE HYPO 22GX1.5 SAFETY (NEEDLE) ×3 IMPLANT
NEEDLE HYPO 25X1 1.5 SAFETY (NEEDLE) ×3 IMPLANT
NS IRRIG 500ML POUR BTL (IV SOLUTION) ×3 IMPLANT
PACK BASIN MINOR ARMC (MISCELLANEOUS) ×3 IMPLANT
SUT PDS 2-0 27IN (SUTURE) ×6 IMPLANT
SUT VIC AB 2-0 SH 27 (SUTURE) ×3
SUT VIC AB 2-0 SH 27XBRD (SUTURE) ×1 IMPLANT
SUT VIC AB 3-0 54X BRD REEL (SUTURE) ×1 IMPLANT
SUT VIC AB 3-0 BRD 54 (SUTURE) ×3
SUT VIC AB 3-0 SH 27 (SUTURE) ×3
SUT VIC AB 3-0 SH 27X BRD (SUTURE) ×1 IMPLANT
SUT VIC AB 4-0 FS2 27 (SUTURE) ×3 IMPLANT
SYR CONTROL 10ML (SYRINGE) ×3 IMPLANT

## 2017-09-12 NOTE — H&P (Signed)
  Pt was scheduled for his inguinal hernia repair. He mistakenly took his full dose of Lovenox this am. Because of potebntial bleeding risks will reschedule surgery for later this week. Pt and his family advised.

## 2017-09-12 NOTE — Anesthesia Preprocedure Evaluation (Deleted)
Anesthesia Evaluation  Patient identified by MRN, date of birth, ID band  Reviewed: Allergy & Precautions, NPO status , Patient's Chart, lab work & pertinent test results  Airway Mallampati: II       Dental  (+) Partial Lower, Poor Dentition   Pulmonary COPD,  COPD inhaler, former smoker,     + decreased breath sounds      Cardiovascular Exercise Tolerance: Poor hypertension, Pt. on medications and Pt. on home beta blockers + CAD and + Peripheral Vascular Disease  + pacemaker + Cardiac Defibrillator  Rhythm:Irregular     Neuro/Psych  Headaches,    GI/Hepatic negative GI ROS, (+)     substance abuse  alcohol use,   Endo/Other  negative endocrine ROS  Renal/GU CRFRenal disease     Musculoskeletal negative musculoskeletal ROS (+)   Abdominal Normal abdominal exam  (+)   Peds negative pediatric ROS (+)  Hematology negative hematology ROS (+)   Anesthesia Other Findings   Reproductive/Obstetrics                             Anesthesia Physical Anesthesia Plan  ASA: III  Anesthesia Plan: General   Post-op Pain Management:    Induction: Intravenous  PONV Risk Score and Plan:   Airway Management Planned: LMA  Additional Equipment:   Intra-op Plan:   Post-operative Plan: Extubation in OR  Informed Consent: I have reviewed the patients History and Physical, chart, labs and discussed the procedure including the risks, benefits and alternatives for the proposed anesthesia with the patient or authorized representative who has indicated his/her understanding and acceptance.     Plan Discussed with: CRNA  Anesthesia Plan Comments:         Anesthesia Quick Evaluation

## 2017-09-12 NOTE — Telephone Encounter (Signed)
Message left on home number for patient to call the office.   Patient was scheduled for right inguinal hernia repair today but unable to have surgery done since he took his Lovenox injection today.   Per Dr. Jamal Collin, patient will be rescheduled to Friday, 09-16-17. He will not take Lovenox injection the day of surgery.

## 2017-09-12 NOTE — Telephone Encounter (Signed)
Spoke with patient and advised him of such. He is also aware to call the day before to get his arrival time.

## 2017-09-15 MED ORDER — CEFAZOLIN SODIUM-DEXTROSE 2-4 GM/100ML-% IV SOLN
2.0000 g | Freq: Once | INTRAVENOUS | Status: AC
  Administered 2017-09-16: 2 g via INTRAVENOUS

## 2017-09-16 ENCOUNTER — Encounter: Payer: Self-pay | Admitting: *Deleted

## 2017-09-16 ENCOUNTER — Ambulatory Visit: Payer: Medicare Other | Admitting: Anesthesiology

## 2017-09-16 ENCOUNTER — Ambulatory Visit
Admission: RE | Admit: 2017-09-16 | Discharge: 2017-09-16 | Disposition: A | Discharge status: Home / Self Care | Payer: Medicare Other | Source: Ambulatory Visit | Attending: General Surgery | Admitting: General Surgery

## 2017-09-16 ENCOUNTER — Encounter
Admission: RE | Disposition: A | Discharge status: Home / Self Care | Payer: Self-pay | Source: Ambulatory Visit | Attending: General Surgery

## 2017-09-16 DIAGNOSIS — K4090 Unilateral inguinal hernia, without obstruction or gangrene, not specified as recurrent: Secondary | ICD-10-CM

## 2017-09-16 DIAGNOSIS — I10 Essential (primary) hypertension: Secondary | ICD-10-CM | POA: Insufficient documentation

## 2017-09-16 DIAGNOSIS — J449 Chronic obstructive pulmonary disease, unspecified: Secondary | ICD-10-CM | POA: Diagnosis not present

## 2017-09-16 DIAGNOSIS — M109 Gout, unspecified: Secondary | ICD-10-CM | POA: Diagnosis not present

## 2017-09-16 DIAGNOSIS — Z7951 Long term (current) use of inhaled steroids: Secondary | ICD-10-CM | POA: Diagnosis not present

## 2017-09-16 DIAGNOSIS — Z950 Presence of cardiac pacemaker: Secondary | ICD-10-CM | POA: Diagnosis not present

## 2017-09-16 DIAGNOSIS — I4891 Unspecified atrial fibrillation: Secondary | ICD-10-CM | POA: Insufficient documentation

## 2017-09-16 DIAGNOSIS — Z7982 Long term (current) use of aspirin: Secondary | ICD-10-CM | POA: Insufficient documentation

## 2017-09-16 DIAGNOSIS — Z7984 Long term (current) use of oral hypoglycemic drugs: Secondary | ICD-10-CM | POA: Insufficient documentation

## 2017-09-16 DIAGNOSIS — Z952 Presence of prosthetic heart valve: Secondary | ICD-10-CM | POA: Diagnosis not present

## 2017-09-16 DIAGNOSIS — I739 Peripheral vascular disease, unspecified: Secondary | ICD-10-CM | POA: Insufficient documentation

## 2017-09-16 DIAGNOSIS — Z7902 Long term (current) use of antithrombotics/antiplatelets: Secondary | ICD-10-CM | POA: Diagnosis not present

## 2017-09-16 DIAGNOSIS — I2510 Atherosclerotic heart disease of native coronary artery without angina pectoris: Secondary | ICD-10-CM | POA: Insufficient documentation

## 2017-09-16 DIAGNOSIS — Z87891 Personal history of nicotine dependence: Secondary | ICD-10-CM | POA: Insufficient documentation

## 2017-09-16 DIAGNOSIS — Z79899 Other long term (current) drug therapy: Secondary | ICD-10-CM | POA: Insufficient documentation

## 2017-09-16 DIAGNOSIS — Z7901 Long term (current) use of anticoagulants: Secondary | ICD-10-CM | POA: Diagnosis not present

## 2017-09-16 HISTORY — PX: INGUINAL HERNIA REPAIR: SHX194

## 2017-09-16 LAB — PROTIME-INR
INR: 0.92
Prothrombin Time: 12.3 seconds (ref 11.4–15.2)

## 2017-09-16 SURGERY — REPAIR, HERNIA, INGUINAL, ADULT
Anesthesia: General | Laterality: Right

## 2017-09-16 MED ORDER — SUCCINYLCHOLINE CHLORIDE 20 MG/ML IJ SOLN
INTRAMUSCULAR | Status: AC
  Filled 2017-09-16: qty 1

## 2017-09-16 MED ORDER — PROPOFOL 500 MG/50ML IV EMUL
INTRAVENOUS | Status: AC
  Filled 2017-09-16: qty 50

## 2017-09-16 MED ORDER — SODIUM CHLORIDE 0.9 % IR SOLN
Status: DC | PRN
  Administered 2017-09-16: 50 mL

## 2017-09-16 MED ORDER — PROPOFOL 10 MG/ML IV BOLUS
INTRAVENOUS | Status: DC | PRN
  Administered 2017-09-16: 20 mg via INTRAVENOUS

## 2017-09-16 MED ORDER — ONDANSETRON HCL 4 MG/2ML IJ SOLN: 4.0000 mg | Freq: Once | INTRAMUSCULAR | Status: DC | PRN

## 2017-09-16 MED ORDER — PROPOFOL 500 MG/50ML IV EMUL
INTRAVENOUS | Status: DC | PRN
  Administered 2017-09-16: 50 ug/kg/min via INTRAVENOUS

## 2017-09-16 MED ORDER — PROPOFOL 10 MG/ML IV BOLUS
INTRAVENOUS | Status: AC
  Filled 2017-09-16: qty 20

## 2017-09-16 MED ORDER — EPHEDRINE SULFATE 50 MG/ML IJ SOLN
INTRAMUSCULAR | Status: AC
  Filled 2017-09-16: qty 1

## 2017-09-16 MED ORDER — LIDOCAINE HCL 1 % IJ SOLN
INTRAMUSCULAR | Status: DC | PRN
  Administered 2017-09-16 (×2): 60 mL

## 2017-09-16 MED ORDER — LIDOCAINE HCL (PF) 2 % IJ SOLN
INTRAMUSCULAR | Status: AC
  Filled 2017-09-16: qty 10

## 2017-09-16 MED ORDER — MIDAZOLAM HCL 2 MG/2ML IJ SOLN
INTRAMUSCULAR | Status: DC | PRN
  Administered 2017-09-16: 0.5 mg via INTRAVENOUS
  Administered 2017-09-16: .5 mg via INTRAVENOUS

## 2017-09-16 MED ORDER — FENTANYL CITRATE (PF) 100 MCG/2ML IJ SOLN
INTRAMUSCULAR | Status: DC | PRN
Start: 2017-09-16 — End: 2017-09-16
  Administered 2017-09-16: 50 ug via INTRAVENOUS

## 2017-09-16 MED ORDER — CEFAZOLIN SODIUM-DEXTROSE 2-4 GM/100ML-% IV SOLN
INTRAVENOUS | Status: AC
  Filled 2017-09-16: qty 100

## 2017-09-16 MED ORDER — BUPIVACAINE HCL (PF) 0.5 % IJ SOLN
INTRAMUSCULAR | Status: AC
  Filled 2017-09-16: qty 30

## 2017-09-16 MED ORDER — ACETAMINOPHEN 10 MG/ML IV SOLN
INTRAVENOUS | Status: AC
  Filled 2017-09-16: qty 100

## 2017-09-16 MED ORDER — OXYCODONE HCL 5 MG PO TABS: 5.0000 mg | ORAL_TABLET | Freq: Four times a day (QID) | ORAL | 0 refills | Status: DC | PRN

## 2017-09-16 MED ORDER — FENTANYL CITRATE (PF) 100 MCG/2ML IJ SOLN: 25.0000 ug | INTRAMUSCULAR | Status: DC | PRN

## 2017-09-16 MED ORDER — EPHEDRINE SULFATE 50 MG/ML IJ SOLN
INTRAMUSCULAR | Status: DC | PRN
  Administered 2017-09-16: 10 mg via INTRAVENOUS

## 2017-09-16 MED ORDER — LIDOCAINE HCL (PF) 1 % IJ SOLN
INTRAMUSCULAR | Status: AC
  Filled 2017-09-16: qty 30

## 2017-09-16 MED ORDER — MIDAZOLAM HCL 2 MG/2ML IJ SOLN
INTRAMUSCULAR | Status: AC
  Filled 2017-09-16: qty 2

## 2017-09-16 MED ORDER — GLYCOPYRROLATE 0.2 MG/ML IJ SOLN
INTRAMUSCULAR | Status: AC
  Filled 2017-09-16: qty 1

## 2017-09-16 MED ORDER — ACETAMINOPHEN 10 MG/ML IV SOLN
INTRAVENOUS | Status: DC | PRN
  Administered 2017-09-16: 1000 mg via INTRAVENOUS

## 2017-09-16 MED ORDER — FENTANYL CITRATE (PF) 100 MCG/2ML IJ SOLN
INTRAMUSCULAR | Status: AC
  Filled 2017-09-16: qty 2

## 2017-09-16 MED ORDER — ATROPINE SULFATE 0.4 MG/ML IJ SOLN
INTRAMUSCULAR | Status: AC
  Filled 2017-09-16: qty 1

## 2017-09-16 MED ORDER — ENOXAPARIN SODIUM 80 MG/0.8ML ~~LOC~~ SOLN: 70.0000 mg | Freq: Two times a day (BID) | SUBCUTANEOUS | 0 refills | Status: DC

## 2017-09-16 MED ORDER — LACTATED RINGERS IV SOLN
INTRAVENOUS | Status: DC
  Administered 2017-09-16: 07:00:00 via INTRAVENOUS

## 2017-09-16 SURGICAL SUPPLY — 35 items
ADH SKN CLS APL DERMABOND .7 (GAUZE/BANDAGES/DRESSINGS) ×1
BLADE CLIPPER SURG (BLADE) ×2 IMPLANT
BLADE SURG 15 STRL SS SAFETY (BLADE) ×3 IMPLANT
CANISTER SUCT 1200ML W/VALVE (MISCELLANEOUS) ×3 IMPLANT
CHLORAPREP W/TINT 26ML (MISCELLANEOUS) ×3 IMPLANT
DECANTER SPIKE VIAL GLASS SM (MISCELLANEOUS) ×6 IMPLANT
DERMABOND ADVANCED (GAUZE/BANDAGES/DRESSINGS) ×2
DERMABOND ADVANCED .7 DNX12 (GAUZE/BANDAGES/DRESSINGS) ×1 IMPLANT
DRAIN PENROSE 1/4X12 LTX (DRAIN) ×3 IMPLANT
DRAPE LAPAROTOMY 100X77 ABD (DRAPES) ×1 IMPLANT
DRAPE LAPAROTOMY TRNSV 106X77 (MISCELLANEOUS) ×2 IMPLANT
ELECT REM PT RETURN 9FT ADLT (ELECTROSURGICAL) ×3
ELECTRODE REM PT RTRN 9FT ADLT (ELECTROSURGICAL) ×1 IMPLANT
GLOVE BIO SURGEON STRL SZ7 (GLOVE) ×7 IMPLANT
GLOVE BIOGEL PI IND STRL 7.0 (GLOVE) IMPLANT
GLOVE BIOGEL PI INDICATOR 7.0 (GLOVE) ×4
GOWN STRL REUS W/ TWL LRG LVL3 (GOWN DISPOSABLE) ×2 IMPLANT
GOWN STRL REUS W/TWL LRG LVL3 (GOWN DISPOSABLE) ×9
KIT RM TURNOVER STRD PROC AR (KITS) ×3 IMPLANT
LABEL OR SOLS (LABEL) ×3 IMPLANT
MESH PARIETEX PROGRIP RIGHT (Mesh General) ×3 IMPLANT
NDL HYPO 25X1 1.5 SAFETY (NEEDLE) ×1 IMPLANT
NEEDLE HYPO 22GX1.5 SAFETY (NEEDLE) ×3 IMPLANT
NEEDLE HYPO 25X1 1.5 SAFETY (NEEDLE) ×3 IMPLANT
NS IRRIG 500ML POUR BTL (IV SOLUTION) ×3 IMPLANT
PACK BASIN MINOR ARMC (MISCELLANEOUS) ×3 IMPLANT
SUT PDS 2-0 27IN (SUTURE) ×6 IMPLANT
SUT VIC AB 2-0 SH 27 (SUTURE) ×6
SUT VIC AB 2-0 SH 27XBRD (SUTURE) ×1 IMPLANT
SUT VIC AB 3-0 54X BRD REEL (SUTURE) ×1 IMPLANT
SUT VIC AB 3-0 BRD 54 (SUTURE) ×3
SUT VIC AB 3-0 SH 27 (SUTURE) ×3
SUT VIC AB 3-0 SH 27X BRD (SUTURE) ×1 IMPLANT
SUT VIC AB 4-0 FS2 27 (SUTURE) ×3 IMPLANT
SYR CONTROL 10ML (SYRINGE) ×3 IMPLANT

## 2017-09-16 NOTE — Discharge Instructions (Signed)

## 2017-09-16 NOTE — Interval H&P Note (Signed)
History and Physical Interval Note:  09/16/2017 7:16 AM  Kirk Jones [id number]  has presented today for surgery, with the diagnosis of RIGHT INGUINAL HERNIA  The various methods of treatment have been discussed with the patient and family. After consideration of risks, benefits and other options for treatment, the patient has consented to  Procedure(s): HERNIA REPAIR INGUINAL ADULT (Right) as a surgical intervention .  The patient's history has been reviewed, patient examined, no change in status, stable for surgery.  I have reviewed the patient's chart and labs.  Questions were answered to the patient's satisfaction.     SANKAR,SEEPLAPUTHUR G

## 2017-09-16 NOTE — Interval H&P Note (Signed)
History and Physical Interval Note:  09/16/2017 7:22 AM  Kirk Jones  has presented today for surgery, with the diagnosis of RIGHT INGUINAL HERNIA  The various methods of treatment have been discussed with the patient and family. After consideration of risks, benefits and other options for treatment, the patient has consented to  Procedure(s): HERNIA REPAIR INGUINAL ADULT (Right) as a surgical intervention .  The patient's history has been reviewed, patient examined, no change in status, stable for surgery.  I have reviewed the patient's chart and labs.  Questions were answered to the patient's satisfaction.     Carmel Garfield G

## 2017-09-16 NOTE — Anesthesia Postprocedure Evaluation (Signed)
Anesthesia Post Note  Patient: Ronit Marczak [facility]  Procedure(s) Performed: HERNIA REPAIR INGUINAL ADULT (Right )  Patient location during evaluation: PACU Anesthesia Type: General Level of consciousness: awake and alert Pain management: pain level controlled Vital Signs Assessment: post-procedure vital signs reviewed and stable Respiratory status: spontaneous breathing, nonlabored ventilation, respiratory function stable and patient connected to nasal cannula oxygen Cardiovascular status: stable and blood pressure returned to baseline Postop Assessment: no apparent nausea or vomiting Anesthetic complications: no     Last Vitals:  Vitals:   09/16/17 0945 09/16/17 1011  BP: (!) 159/71 (!) 145/66  Pulse: 71 74  Resp: 18 18  Temp: (!) 36.1 C 36.4 C  SpO2: 98% 98%    Last Pain:  Vitals:   09/16/17 1011  TempSrc: Temporal  PainSc:                  Haidan Nhan S

## 2017-09-16 NOTE — Anesthesia Post-op Follow-up Note (Signed)
Anesthesia QCDR form completed.        

## 2017-09-16 NOTE — Anesthesia Preprocedure Evaluation (Addendum)
Anesthesia Evaluation  Patient identified by MRN, date of birth, ID band Patient awake    Reviewed: Allergy & Precautions, NPO status , Patient's Chart, lab work & pertinent test results, reviewed documented beta blocker date and time   Airway Mallampati: II  TM Distance: >3 FB     Dental  (+) Chipped, Partial Lower, Partial Upper   Pulmonary COPD, former smoker,           Cardiovascular hypertension, Pt. on medications and Pt. on home beta blockers + CAD and + Peripheral Vascular Disease  + dysrhythmias Atrial Fibrillation + pacemaker + Cardiac Defibrillator      Neuro/Psych  Headaches,    GI/Hepatic   Endo/Other    Renal/GU Renal disease     Musculoskeletal   Abdominal   Peds  Hematology   Anesthesia Other Findings Aortic valve replacement.  Reproductive/Obstetrics                            Anesthesia Physical Anesthesia Plan  ASA: III  Anesthesia Plan: MAC   Post-op Pain Management:    Induction:   PONV Risk Score and Plan:   Airway Management Planned:   Additional Equipment:   Intra-op Plan:   Post-operative Plan:   Informed Consent: I have reviewed the patients History and Physical, chart, labs and discussed the procedure including the risks, benefits and alternatives for the proposed anesthesia with the patient or authorized representative who has indicated his/her understanding and acceptance.     Plan Discussed with: CRNA  Anesthesia Plan Comments:        Anesthesia Quick Evaluation

## 2017-09-16 NOTE — Transfer of Care (Signed)
Immediate Anesthesia Transfer of Care Note  Patient: Kirk Jones [facility]  Procedure(s) Performed: HERNIA REPAIR INGUINAL ADULT (Right )  Patient Location: PACU  Anesthesia Type:MAC  Level of Consciousness: awake  Airway & Oxygen Therapy: Patient Spontanous Breathing and Patient connected to nasal cannula oxygen  Post-op Assessment: Report given to RN and Post -op Vital signs reviewed and stable  Post vital signs: Reviewed and stable  Last Vitals:  Vitals:   09/16/17 0614  BP: 136/81  Pulse: 79  Resp: 18  Temp: 36.6 C  SpO2: 99%    Last Pain:  Vitals:   09/16/17 0614  TempSrc: Oral  PainSc: 4          Complications: No apparent anesthesia complications

## 2017-09-16 NOTE — H&P (Signed)
Kirk Jones is an 76 y.o. male.   Chief Complaint:hernia repair NFA:[id number] Kirk Jones is a 76 y.o. male here today for a evaluation of a right inguinal hernia. Patient noticed this area about a year ago. In the last two week the area has been swollen and he is becoming increasingly symptomatic upon exertion. Denies history of prior hernias. He has a history of Afib, a prosthetic heart valve replacement and a pacemaker and defibrillator and is on Coumadin, being closely monitored by PCP and cardiology.   Past Medical History:  Diagnosis Date  . Aortic valve regurgitation   . Atrial fibrillation ([place])   . Barrett's esophagus   . COPD (chronic obstructive pulmonary disease) ([place])   . Diverticulosis   . Gout   . Headache    aura only  . Hematuria   . History of prosthetic heart valve   . Hyperlipidemia   . Hypertension   . Nodular prostate with urinary obstruction   . Substance abuse ([place])    alcohol    Past Surgical History:  Procedure Laterality Date  . CARDIAC VALVE REPLACEMENT N/A 2004  . CARPAL TUNNEL RELEASE Right 2015  . CATARACT EXTRACTION Right 2017  . COLONOSCOPY WITH PROPOFOL N/A 2014  . CORONARY ANGIOPLASTY     stints x2  . EYE SURGERY     cross eyed  . FOOT SURGERY Bilateral    [place]  . FRACTURE SURGERY Right    femur  . INSERT / REPLACE / [place]     ICD  . PACEMAKER IMPLANT Left 2013   and ICD  . VASECTOMY      Family History  Problem Relation Age of Onset  . Heart attack Mother   . Diabetes Maternal Grandmother   . Cancer Sister        lung   Social History:  reports that he quit smoking about 26 years ago. His smoking use included Cigarettes. He has never used smokeless tobacco. He reports that he drinks alcohol. He reports that he does not use drugs.  Allergies:  Allergies  Allergen Reactions  . Prednisone Other (See Comments)    Reaction: "organs shutting down"    Medications Prior to Admission  Medication Sig Dispense  Refill  . acetaminophen (TYLENOL) 650 MG CR tablet Take 650 mg by mouth 2 (two) times daily.    Marland Kitchen desonide (DESOWEN) 0.05 % lotion Apply 1 application topically daily.    Marland Kitchen diltiazem (DILACOR XR) 180 MG 24 hr capsule TAKE 1 CAPSULE BY MOUTH ONCE DAILY (Patient taking differently: Take 180 mg by mouth daily) 90 capsule 1  . empagliflozin (JARDIANCE) 10 MG TABS tablet Take 10 mg by mouth daily.    . febuxostat (ULORIC) 40 MG tablet Take 1 tablet (40 mg total) by mouth daily. 90 tablet 3  . fexofenadine (ALLEGRA) 180 MG tablet Take 180 mg by mouth daily.    . finasteride (PROSCAR) 5 MG tablet Take 5 mg by mouth daily.    . fluticasone (FLONASE) 50 MCG/ACT nasal spray Place 2 sprays into both nostrils daily. 16 g 12  . furosemide (LASIX) 40 MG tablet Take 40 mg by mouth daily.     Marland Kitchen ketoconazole (NIZORAL) 2 % shampoo Apply 1 application topically 2 (two) times a week.     . losartan (COZAAR) 25 MG tablet Take 1 tablet (25 mg total) by mouth daily. 90 tablet 4  . Magnesium 400 MG TABS Take 400 mg by mouth daily.     Marland Kitchen  metoprolol succinate (TOPROL-XL) 50 MG 24 hr tablet Take 1 tablet (50 mg total) by mouth 2 (two) times daily. Take with or immediately following a meal. 180 tablet 4  . Multiple Vitamins-Minerals (CENTRUM ADULTS PO) Take 1 tablet by mouth daily.     Marland Kitchen omeprazole (PRILOSEC) 20 MG capsule Take 1 capsule (20 mg total) by mouth 2 (two) times daily before a meal. (Patient taking differently: Take 20 mg by mouth daily. ) 180 capsule 4  . Polyethyl Glycol-Propyl Glycol (SYSTANE OP) Place 1 drop into both eyes as needed (for dry eyes).    . tamsulosin (FLOMAX) 0.4 MG CAPS capsule Take 0.4 mg by mouth daily.    Marland Kitchen tiotropium (SPIRIVA) 18 MCG inhalation capsule Place 1 capsule (18 mcg total) into inhaler and inhale daily. (Patient taking differently: Place 18 mcg into inhaler and inhale daily as needed (chest congestion). ) 90 capsule 4  . albuterol (PROVENTIL HFA;VENTOLIN HFA) 108 (90 Base) MCG/ACT  inhaler Inhale 1 puff into the lungs every 6 (six) hours as needed for wheezing or shortness of breath.    Marland Kitchen aspirin EC 81 MG tablet Take 81 mg by mouth daily.    . colchicine 0.6 MG tablet Take 1 tablet (0.6 mg total) by mouth daily. (Patient taking differently: Take 0.6 mg by mouth daily as needed (for gout). ) 30 tablet 1  . enoxaparin (LOVENOX) 80 MG/0.8ML injection Inject 70 mg into the skin every 12 (twelve) hours.     . ferrous sulfate 325 (65 FE) MG tablet Take 325 mg by mouth every Wednesday.     . warfarin (COUMADIN) 4 MG tablet Take 1 tablet (4 mg total) by mouth daily at 6 PM. (Patient taking differently: Take 3.5-4 mg by mouth See admin instructions. Take 3.5 mg by mouth every other day alternating with 4 mg by mouth every other day) 25 tablet 0    Results for orders placed or performed during the hospital encounter of 09/16/17 (from the past 48 hour(s))  PT- INR at PAT visit (Pre-admission Testing)     Status: None   Collection Time: 09/16/17  6:18 AM  Result Value Ref Range   Prothrombin Time 12.3 11.4 - 15.2 seconds   INR 0.92    No results found.  Review of Systems  Constitutional: Negative.   Respiratory: Negative.   Cardiovascular: Negative.   Gastrointestinal: Negative.   Genitourinary: Negative.     Blood pressure 136/81, pulse 79, temperature 97.9 F (36.6 C), temperature source Oral, resp. rate 18, height 5\' 3"  (1.6 m), weight 154 lb (69.9 kg), SpO2 99 %. Physical Exam  Physical Exam Physical Exam  Constitutional: He is oriented to person, place, and time. He appears well-developed and well-nourished.  Eyes: Conjunctivae are normal. No scleral icterus.  Neck: Neck supple. No thyromegaly present.  Cardiovascular: Normal rate and normal heart sounds.  An irregular rhythm present.  Pulmonary/Chest: Effort normal and breath sounds normal.  Abdominal: Soft. Normal appearance and bowel sounds are normal. There is no hepatomegaly. There is no tenderness. A hernia is  present. Hernia confirmed positive in the right inguinal area.    Lymphadenopathy:    He has no cervical adenopathy.  Neurological: He is alert and oriented to person, place, and time.  Skin: Skin is warm and dry.    Data Reviewed Notes reviewed   Assessment    Right inguinal hernia- large. Reducible. By history it is symptomatic of recent with enlargement.  Plan   If pt is stable  from cardiac status operative repair can be achieved with local anesthesia and sedation. Will consult his cardiologist, Dr. Saralyn Pilar, for cardiac clearance and for perioperative anticoagulation management. Patient will need bridge therapy with Lovenox for the operation and cardiology's assistance will be needed for dosing management.   Hernia precautions and incarceration were discussed with the patient. If they develop symptoms of an incarcerated hernia, they were encouraged to seek prompt medical attention.  I have recommended repair of the hernia using mesh on an outpatient basis in the near future. The risk of infection was reviewed. The role of prosthetic mesh to minimize the risk of recurrence was reviewed.  HPI, Physical Exam, Assessment and Plan have been scribed under the direction and in the presence of Mckinley Jewel, MD  Gaspar Cola, CMA  The patient is scheduled for surgery at [facility] on 09/12/17. He will pre admit at the hospital. He will need Cardiac Clearance as well as Lovenox bridge therapy. This will be requested thru Dr Saralyn Pilar office. The patient is aware of date and instructions.  Documented by Lesly Rubenstein LPN    I have completed the exam and reviewed the above documentation for accuracy and completeness.  I agree with the above.  Haematologist has been used and any errors in dictation or transcription are unintentional.  Kyliegh Jester G. Jamal Collin, M.D., F.A.C.S.   Junie Panning G 08/16/2017, 3:00 PM Assessment/Plan Pt has not taken Lovenox as advised.  OK to proceed with surgery  Christene Lye, MD 09/16/2017, 7:19 AM

## 2017-09-16 NOTE — Op Note (Signed)
Preop diagnosis right inguinal hernia   Post op diagnosis: Same both indirect and direct  Operation: Repair right inguinal hernia with Progrip mesh  Surgeon: SPG. Sankar R  Assistant:     Anesthesia: monitored anesthesia care  Complications: none  EBL: minimal  Drains: none  Description: patient was placed supine on the operating table in the right inguinal region was prepped and draped as sterile field. With adequate sedation and monitoring ocal anesthetic containing 0.5% Marcaine mixed with 1% Xylocaine was instilled in the inguinal region. Total of 60 mL was used.Skin incision was made along the medial two thirds of the inguinal canal and deepened through the layers down the external oblique.Bleeding was controlled cautery and ligatures of 3-0 Vicryl. External oblique was opened along the line of its fibers and inguinal canal explored.patient had a 2 cm direct hernia located in the midportion of the posterior wall. Ilioinguinal nerve was not identified in the field.fascia over the cord which was freed and opened to reveal the presence of a long indirect sac with the some adhesion to surrounding structures from chronicity. No contents within the sac at this time. The sac was completely freed down to the internal ring area. It was opened and under direct vision was suture ligated at the neck with 2-0 Vicryl and amputated. The suture was used to tack the stump to the undersurface of the internal oblique muscle. Progrip mesh was brought uplaced around the cord and laid down against the posterior wall and the lateral ends tucked underneath the external oblique.the medial end of the mesh was tacked to the pubic tubercle with 2 stitches of 2-0 PDS. Repair was satisfactory. Wound was irrigated. External oblique closed with a running 2-0 PDS. Subcutaneous tissue closed with 3-0 Vicryl. Skin closed with subcuticular 4-0 Vicryl covered with Dermabond. Patient tolerated the procedure well with no immediate  problems noted and he was returned recovery room stable condition.

## 2017-09-16 NOTE — H&P (View-Only) (Signed)
  Pt was scheduled for his inguinal hernia repair. He mistakenly took his full dose of Lovenox this am. Because of potebntial bleeding risks will reschedule surgery for later this week. Pt and his family advised.

## 2017-09-16 NOTE — Anesthesia Procedure Notes (Signed)
Procedure Name: MAC Date/Time: 09/16/2017 7:35 AM Performed by: Allean Found Pre-anesthesia Checklist: Emergency Drugs available, Patient identified, Suction available, Patient being monitored and Timeout performed Patient Re-evaluated:Patient Re-evaluated prior to induction Oxygen Delivery Method: Nasal cannula Placement Confirmation: positive ETCO2

## 2017-09-19 ENCOUNTER — Encounter: Payer: Self-pay | Admitting: General Surgery

## 2017-09-27 ENCOUNTER — Ambulatory Visit (INDEPENDENT_AMBULATORY_CARE_PROVIDER_SITE_OTHER): Payer: Medicare Other | Admitting: General Surgery

## 2017-09-27 ENCOUNTER — Encounter: Payer: Self-pay | Admitting: General Surgery

## 2017-09-27 VITALS — BP 122/70 | HR 92 | Resp 14 | Ht 66.0 in | Wt 156.0 lb

## 2017-09-27 DIAGNOSIS — K4090 Unilateral inguinal hernia, without obstruction or gangrene, not specified as recurrent: Secondary | ICD-10-CM

## 2017-09-27 NOTE — Progress Notes (Signed)
[name], male   DOB: 07-27-1941, 76 y.o.   MRN: [id number]  No chief complaint on file.   HPI Kirk Jones is a 76 y.o. male here today for his post op right inguinal hernia repair with Progrip mesh done on 09/16/2017. Patient states he is doing well.  HPI  Past Medical History:  Diagnosis Date  . Aortic valve regurgitation   . Atrial fibrillation ([place])   . Barrett's esophagus   . COPD (chronic obstructive pulmonary disease) ([place])   . Diverticulosis   . Gout   . Headache    aura only  . Hematuria   . History of prosthetic heart valve   . Hyperlipidemia   . Hypertension   . Nodular prostate with urinary obstruction   . Substance abuse ([place])    alcohol    Past Surgical History:  Procedure Laterality Date  . CARDIAC VALVE REPLACEMENT N/A 2004  . CARPAL TUNNEL RELEASE Right 2015  . CATARACT EXTRACTION Right 2017  . COLONOSCOPY WITH PROPOFOL N/A 2014  . CORONARY ANGIOPLASTY     stints x2  . EYE SURGERY     cross eyed  . FOOT SURGERY Bilateral    [place]  . FRACTURE SURGERY Right    [place]  . INGUINAL HERNIA REPAIR Right 09/16/2017   Procedure: HERNIA REPAIR INGUINAL ADULT;  Surgeon: Christene Lye, MD;  Location: [facility] ORS;  Service: General;  Laterality: Right;  . INSERT / REPLACE / [place]     ICD  . PACEMAKER IMPLANT Left 2013   and ICD  . VASECTOMY      Family History  Problem Relation Age of Onset  . Heart attack Mother   . Diabetes Maternal Grandmother   . Cancer Sister        lung    Social History Social History  Substance Use Topics  . Smoking status: Former Smoker    Types: Cigarettes    Quit date: 04/24/1991  . Smokeless tobacco: Never Used  . Alcohol use Yes     Comment: quit December 2015/restarted    Allergies  Allergen Reactions  . Prednisone Other (See Comments)    Reaction: "organs shutting down"    Current Outpatient Prescriptions  Medication Sig Dispense Refill  . acetaminophen  (TYLENOL) 650 MG CR tablet Take 650 mg by mouth 2 (two) times daily.    Marland Kitchen albuterol (PROVENTIL HFA;VENTOLIN HFA) 108 (90 Base) MCG/ACT inhaler Inhale 1 puff into the lungs every 6 (six) hours as needed for wheezing or shortness of breath.    Marland Kitchen aspirin EC 81 MG tablet Take 81 mg by mouth daily.    . colchicine 0.6 MG tablet Take 1 tablet (0.6 mg total) by mouth daily. (Patient taking differently: Take 0.6 mg by mouth daily as needed (for gout). ) 30 tablet 1  . desonide (DESOWEN) 0.05 % lotion Apply 1 application topically daily.    Marland Kitchen diltiazem (DILACOR XR) 180 MG 24 hr capsule TAKE 1 CAPSULE BY MOUTH ONCE DAILY (Patient taking differently: Take 180 mg by mouth daily) 90 capsule 1  . empagliflozin (JARDIANCE) 10 MG TABS tablet Take 10 mg by mouth daily.    . febuxostat (ULORIC) 40 MG tablet Take 1 tablet (40 mg total) by mouth daily. 90 tablet 3  . ferrous sulfate 325 (65 FE) MG tablet Take 325 mg by mouth every Wednesday.     . fexofenadine (ALLEGRA) 180 MG tablet Take 180 mg by mouth daily.    Marland Kitchen  finasteride (PROSCAR) 5 MG tablet Take 5 mg by mouth daily.    . fluticasone (FLONASE) 50 MCG/ACT nasal spray Place 2 sprays into both nostrils daily. 16 g 12  . furosemide (LASIX) 40 MG tablet Take 40 mg by mouth daily.     Marland Kitchen ketoconazole (NIZORAL) 2 % shampoo Apply 1 application topically 2 (two) times a week.     . losartan (COZAAR) 25 MG tablet Take 1 tablet (25 mg total) by mouth daily. 90 tablet 4  . Magnesium 400 MG TABS Take 400 mg by mouth daily.     . metoprolol succinate (TOPROL-XL) 50 MG 24 hr tablet Take 1 tablet (50 mg total) by mouth 2 (two) times daily. Take with or immediately following a meal. 180 tablet 4  . Multiple Vitamins-Minerals (CENTRUM ADULTS PO) Take 1 tablet by mouth daily.     Marland Kitchen omeprazole (PRILOSEC) 20 MG capsule Take 1 capsule (20 mg total) by mouth 2 (two) times daily before a meal. (Patient taking differently: Take 20 mg by mouth daily. ) 180 capsule 4  . Polyethyl  Glycol-Propyl Glycol (SYSTANE OP) Place 1 drop into both eyes as needed (for dry eyes).    . tamsulosin (FLOMAX) 0.4 MG CAPS capsule Take 0.4 mg by mouth daily.    Marland Kitchen tiotropium (SPIRIVA) 18 MCG inhalation capsule Place 1 capsule (18 mcg total) into inhaler and inhale daily. (Patient taking differently: Place 18 mcg into inhaler and inhale daily as needed (chest congestion). ) 90 capsule 4  . enoxaparin (LOVENOX) 80 MG/0.8ML injection Inject 0.7 mLs (70 mg total) into the skin every 12 (twelve) hours. 9.8 mL 0   No current facility-administered medications for this visit.     Review of Systems Review of Systems  Constitutional: Negative.   Respiratory: Negative.   Cardiovascular: Negative.     Blood pressure 122/70, pulse 92, resp. rate 14, height 5\' 6"  (1.676 m), weight 156 lb (70.8 kg).  Physical Exam Physical Exam  Constitutional: He is oriented to person, place, and time. He appears well-developed and well-nourished.  Abdominal:  Right inguinal hernia repair intact.   Neurological: He is alert and oriented to person, place, and time.  Skin: Skin is warm and dry.   Mild swelling of the inguinal area from recent surgery consistent with a healing process, no sign of infection Data Reviewed Op note reviewed   Assessment    Right inguinal hernia repair intact.  Incision clean and healing well    Plan    Patient to return in one month. The patient is aware to call back for any questions or concerns.  HPI, Physical Exam, Assessment and Plan have been scribed under the direction and in the presence of Mckinley Jewel, MD  Gaspar Cola, CMA I have completed the exam and reviewed the above documentation for accuracy and completeness.  I agree with the above.  Haematologist has been used and any errors in dictation or transcription are unintentional.  Seeplaputhur G. Jamal Collin, M.D., F.A.C.S.        Junie Panning G 09/28/2017, 10:11 AM

## 2017-09-27 NOTE — Patient Instructions (Addendum)
Patient to return in one month. The patient is aware to call back for any questions or concerns. 

## 2017-09-29 DIAGNOSIS — N182 Chronic kidney disease, stage 2 (mild): Secondary | ICD-10-CM | POA: Diagnosis not present

## 2017-09-29 DIAGNOSIS — R600 Localized edema: Secondary | ICD-10-CM | POA: Diagnosis not present

## 2017-09-29 DIAGNOSIS — D472 Monoclonal gammopathy: Secondary | ICD-10-CM | POA: Diagnosis not present

## 2017-09-29 DIAGNOSIS — I10 Essential (primary) hypertension: Secondary | ICD-10-CM | POA: Diagnosis not present

## 2017-10-04 DIAGNOSIS — H401130 Primary open-angle glaucoma, bilateral, stage unspecified: Secondary | ICD-10-CM | POA: Diagnosis not present

## 2017-10-04 DIAGNOSIS — H26493 Other secondary cataract, bilateral: Secondary | ICD-10-CM | POA: Diagnosis not present

## 2017-10-07 ENCOUNTER — Ambulatory Visit (INDEPENDENT_AMBULATORY_CARE_PROVIDER_SITE_OTHER): Payer: Medicare Other | Admitting: Family Medicine

## 2017-10-07 ENCOUNTER — Encounter: Payer: Self-pay | Admitting: Family Medicine

## 2017-10-07 VITALS — BP 148/76 | HR 83 | Temp 98.8°F | Wt 157.8 lb

## 2017-10-07 DIAGNOSIS — I482 Chronic atrial fibrillation, unspecified: Secondary | ICD-10-CM

## 2017-10-07 DIAGNOSIS — I639 Cerebral infarction, unspecified: Secondary | ICD-10-CM

## 2017-10-07 DIAGNOSIS — Z952 Presence of prosthetic heart valve: Secondary | ICD-10-CM

## 2017-10-07 DIAGNOSIS — Z23 Encounter for immunization: Secondary | ICD-10-CM | POA: Diagnosis not present

## 2017-10-07 LAB — COAGUCHEK XS/INR WAIVED
INR: 2.5 — ABNORMAL HIGH (ref 0.9–1.1)
PROTHROMBIN TIME: 29.5 s

## 2017-10-07 NOTE — Progress Notes (Signed)
   BP (!) 148/76 (BP Location: Left Arm, Patient Position: Sitting, Cuff Size: Normal)   Pulse 83   Temp 98.8 F (37.1 C) (Oral)   Wt 157 lb 12.8 oz (71.6 kg)   SpO2 96%   BMI 25.47 kg/m    Subjective:    [name], male    DOB: May 02, 1941, 76 y.o.   MRN: [id number]  HPI: Kirk Jones is a 77 y.o. male  Chief Complaint  Patient presents with  . Atrial Fibrillation  Patient presents today for INR f/u. Alternating between 3.5 and 4 mg coumadin daily. No bleeding or bruising issues. Had hernia repair about a month ago, healing well. No dietary changes. Denies CP, SOB, palpitations.   Relevant past medical, surgical, family and social history reviewed and updated as indicated. Interim medical history since our last visit reviewed. Allergies and medications reviewed and updated.  Review of Systems  Constitutional: Negative.   Respiratory: Negative.   Cardiovascular: Negative.   Gastrointestinal: Negative.   Genitourinary: Negative.   Musculoskeletal: Negative.   Neurological: Negative.   Hematological: Does not bruise/bleed easily.  Psychiatric/Behavioral: Negative.    Per HPI unless specifically indicated above     Objective:    BP (!) 148/76 (BP Location: Left Arm, Patient Position: Sitting, Cuff Size: Normal)   Pulse 83   Temp 98.8 F (37.1 C) (Oral)   Wt 157 lb 12.8 oz (71.6 kg)   SpO2 96%   BMI 25.47 kg/m   Wt Readings from Last 3 Encounters:  10/07/17 157 lb 12.8 oz (71.6 kg)  09/27/17 156 lb (70.8 kg)  09/16/17 154 lb (69.9 kg)    Physical Exam  Constitutional: He is oriented to person, place, and time. He appears well-developed and well-nourished. No distress.  HENT:  Head: Atraumatic.  Eyes: Conjunctivae are normal. Pupils are equal, round, and reactive to light. No scleral icterus.  Neck: Normal range of motion. Neck supple.  Cardiovascular: Normal rate.  Pulmonary/Chest: Effort normal. No respiratory distress.    Musculoskeletal: Normal range of motion.  Neurological: He is alert and oriented to person, place, and time.  Skin: Skin is warm and dry.  Psychiatric: He has a normal mood and affect. His behavior is normal.  Nursing note and vitals reviewed.  Results for orders placed or performed in visit on 10/07/17  CoaguChek XS/INR Waived  Result Value Ref Range   INR 2.5 (H) 0.9 - 1.1   Prothrombin Time 29.5 sec      Assessment & Plan:   Problem List Items Addressed This Visit      Cardiovascular and Mediastinum   Atrial fibrillation ([place]) - Primary (Chronic)    INR at goal today at 2.5. Continue current regimen of 3.5 and 4 mg alternating. F/u in 1 month.       Relevant Medications   warfarin (COUMADIN) 3 MG tablet   warfarin (COUMADIN) 4 MG tablet   Other Relevant Orders   CoaguChek XS/INR Waived (Completed)     Other   History of prosthetic heart valve (Chronic)    Other Visit Diagnoses    Need for influenza vaccination       Relevant Orders   Flu vaccine HIGH DOSE PF (Fluzone High dose) (Completed)       Follow up plan: Return in about 4 weeks (around 11/04/2017) for INR.

## 2017-10-07 NOTE — Patient Instructions (Signed)

## 2017-10-10 NOTE — Assessment & Plan Note (Signed)
INR at goal today at 2.5. Continue current regimen of 3.5 and 4 mg alternating. F/u in 1 month.

## 2017-10-12 ENCOUNTER — Other Ambulatory Visit: Payer: Self-pay | Admitting: Family Medicine

## 2017-10-12 ENCOUNTER — Other Ambulatory Visit: Payer: Self-pay

## 2017-10-12 DIAGNOSIS — D649 Anemia, unspecified: Secondary | ICD-10-CM

## 2017-10-13 ENCOUNTER — Ambulatory Visit (INDEPENDENT_AMBULATORY_CARE_PROVIDER_SITE_OTHER): Payer: Medicare Other

## 2017-10-13 ENCOUNTER — Other Ambulatory Visit: Payer: Self-pay

## 2017-10-13 ENCOUNTER — Other Ambulatory Visit: Payer: Self-pay | Admitting: Family Medicine

## 2017-10-13 VITALS — BP 115/67 | HR 84 | Temp 97.8°F | Resp 16 | Ht 64.0 in | Wt 157.5 lb

## 2017-10-13 DIAGNOSIS — Z0000 Encounter for general adult medical examination without abnormal findings: Secondary | ICD-10-CM

## 2017-10-13 DIAGNOSIS — I10 Essential (primary) hypertension: Secondary | ICD-10-CM

## 2017-10-13 NOTE — Telephone Encounter (Signed)
Pt has not seen MD in over a year for HTN and is requesting refill for metoproplol

## 2017-10-13 NOTE — Patient Instructions (Signed)
[name] , Thank you for taking time to come for your Medicare Wellness Visit. I appreciate your ongoing commitment to your health goals. Please review the following plan we discussed and let me know if I can assist you in the future.   Screening recommendations/referrals: Colonoscopy: completed 09/09/2011 Recommended yearly ophthalmology/optometry visit for glaucoma screening and checkup Recommended yearly dental visit for hygiene and checkup  Vaccinations: Influenza vaccine: up to date, last done 10/07/2017 Pneumococcal vaccine: up to date ,completed series  Tdap vaccine: up to date , done 08/06/2014 Shingles vaccine: up to date, done 07/27/2013  Advanced directives: Please bring a copy of your health care power of attorney and living will to the office at your convenience.  Risks: none  Next appointment: Follow up on 10/19/2017 at 10:00am with Dr.Crissman. Follow up in one year for your annual wellness exam.  Preventive Care 65 Years and Older, Male Preventive care refers to lifestyle choices and visits with your health care provider that can promote health and wellness. What does preventive care include?  A yearly physical exam. This is also called an annual well check.  Dental exams once or twice a year.  Routine eye exams. Ask your health care provider how often you should have your eyes checked.  Personal lifestyle choices, including:  Daily care of your teeth and gums.  Regular physical activity.  Eating a healthy diet.  Avoiding tobacco and drug use.  Limiting alcohol use.  Practicing safe sex.  Taking low doses of aspirin every day.  Taking vitamin and mineral supplements as recommended by your health care provider. What happens during an annual well check? The services and screenings done by your health care provider during your annual well check will depend on your age, overall health, lifestyle risk factors, and family history of disease. Counseling  Your  health care provider may ask you questions about your:  Alcohol use.  Tobacco use.  Drug use.  Emotional well-being.  Home and relationship well-being.  Sexual activity.  Eating habits.  History of falls.  Memory and ability to understand (cognition).  Work and work Statistician. Screening  You may have the following tests or measurements:  Height, weight, and BMI.  Blood pressure.  Lipid and cholesterol levels. These may be checked every 5 years, or more frequently if you are over 18 years old.  Skin check.  Lung cancer screening. You may have this screening every year starting at age 50 if you have a 30-pack-year history of smoking and currently smoke or have quit within the past 15 years.  Fecal occult blood test (FOBT) of the stool. You may have this test every year starting at age 98.  Flexible sigmoidoscopy or colonoscopy. You may have a sigmoidoscopy every 5 years or a colonoscopy every 10 years starting at age 52.  Prostate cancer screening. Recommendations will vary depending on your family history and other risks.  Hepatitis C blood test.  Hepatitis B blood test.  Sexually transmitted disease (STD) testing.  Diabetes screening. This is done by checking your blood sugar (glucose) after you have not eaten for a while (fasting). You may have this done every 1-3 years.  Abdominal aortic aneurysm (AAA) screening. You may need this if you are a current or former smoker.  Osteoporosis. You may be screened starting at age 30 if you are at high risk. Talk with your health care provider about your test results, treatment options, and if necessary, the need for more tests. Vaccines  Your  health care provider may recommend certain vaccines, such as:  Influenza vaccine. This is recommended every year.  Tetanus, diphtheria, and acellular pertussis (Tdap, Td) vaccine. You may need a Td booster every 10 years.  Zoster vaccine. You may need this after age  9.  Pneumococcal 13-valent conjugate (PCV13) vaccine. One dose is recommended after age 54.  Pneumococcal polysaccharide (PPSV23) vaccine. One dose is recommended after age 68. Talk to your health care provider about which screenings and vaccines you need and how often you need them. This information is not intended to replace advice given to you by your health care provider. Make sure you discuss any questions you have with your health care provider. Document Released: 12/12/2015 Document Revised: 08/04/2016 Document Reviewed: 09/16/2015 Elsevier Interactive Patient Education  2017 [place] Prevention in the Home Falls can cause injuries. They can happen to people of all ages. There are many things you can do to make your home safe and to help prevent falls. What can I do on the outside of my home?  Regularly fix the edges of walkways and driveways and fix any cracks.  Remove anything that might make you trip as you walk through a door, such as a raised step or threshold.  Trim any bushes or trees on the path to your home.  Use bright outdoor lighting.  Clear any walking paths of anything that might make someone trip, such as rocks or tools.  Regularly check to see if handrails are loose or broken. Make sure that both sides of any steps have handrails.  Any raised decks and porches should have guardrails on the edges.  Have any leaves, snow, or ice cleared regularly.  Use sand or salt on walking paths during winter.  Clean up any spills in your garage right away. This includes oil or grease spills. What can I do in the bathroom?  Use night lights.  Install grab bars by the toilet and in the tub and shower. Do not use towel bars as grab bars.  Use non-skid mats or decals in the tub or shower.  If you need to sit down in the shower, use a plastic, non-slip stool.  Keep the floor dry. Clean up any water that spills on the floor as soon as it happens.  Remove  soap buildup in the tub or shower regularly.  Attach bath mats securely with double-sided non-slip rug tape.  Do not have throw rugs and other things on the floor that can make you trip. What can I do in the bedroom?  Use night lights.  Make sure that you have a light by your bed that is easy to reach.  Do not use any sheets or blankets that are too big for your bed. They should not hang down onto the floor.  Have a firm chair that has side arms. You can use this for support while you get dressed.  Do not have throw rugs and other things on the floor that can make you trip. What can I do in the kitchen?  Clean up any spills right away.  Avoid walking on wet floors.  Keep items that you use a lot in easy-to-reach places.  If you need to reach something above you, use a strong step stool that has a grab bar.  Keep electrical cords out of the way.  Do not use floor polish or wax that makes floors slippery. If you must use wax, use non-skid floor wax.  Do not  have throw rugs and other things on the floor that can make you trip. What can I do with my stairs?  Do not leave any items on the stairs.  Make sure that there are handrails on both sides of the stairs and use them. Fix handrails that are broken or loose. Make sure that handrails are as long as the stairways.  Check any carpeting to make sure that it is firmly attached to the stairs. Fix any carpet that is loose or worn.  Avoid having throw rugs at the top or bottom of the stairs. If you do have throw rugs, attach them to the floor with carpet tape.  Make sure that you have a light switch at the top of the stairs and the bottom of the stairs. If you do not have them, ask someone to add them for you. What else can I do to help prevent falls?  Wear shoes that:  Do not have high heels.  Have rubber bottoms.  Are comfortable and fit you well.  Are closed at the toe. Do not wear sandals.  If you use a  stepladder:  Make sure that it is fully opened. Do not climb a closed stepladder.  Make sure that both sides of the stepladder are locked into place.  Ask someone to hold it for you, if possible.  Clearly mark and make sure that you can see:  Any grab bars or handrails.  First and last steps.  Where the edge of each step is.  Use tools that help you move around (mobility aids) if they are needed. These include:  Canes.  Walkers.  Scooters.  Crutches.  Turn on the lights when you go into a dark area. Replace any light bulbs as soon as they burn out.  Set up your furniture so you have a clear path. Avoid moving your furniture around.  If any of your floors are uneven, fix them.  If there are any pets around you, be aware of where they are.  Review your medicines with your doctor. Some medicines can make you feel dizzy. This can increase your chance of falling. Ask your doctor what other things that you can do to help prevent falls. This information is not intended to replace advice given to you by your health care provider. Make sure you discuss any questions you have with your health care provider. Document Released: 09/11/2009 Document Revised: 04/22/2016 Document Reviewed: 12/20/2014 Elsevier Interactive Patient Education  2017 [employer].

## 2017-10-13 NOTE — Progress Notes (Signed)
Subjective:   Kirk Jones is a 76 y.o. male who presents for Medicare Annual/Subsequent preventive examination.  Review of Systems:  Cardiac Risk Factors include: hypertension;advanced age (>45men, >11 women);male gender;dyslipidemia;sedentary lifestyle     Objective:    Vitals: BP 115/67 (BP Location: Left Arm, Patient Position: Sitting)   Pulse 84   Temp 97.8 F (36.6 C) (Oral)   Resp 16   Ht 5\' 4"  (1.626 m)   Wt 157 lb 8 oz (71.4 kg)   BMI 27.03 kg/m   Body mass index is 27.03 kg/m.  Tobacco Social History   Tobacco Use  Smoking Status Former Smoker  . Types: Cigarettes  . Last attempt to quit: 04/24/1991  . Years since quitting: 26.4  Smokeless Tobacco Never Used     Counseling given: Not Answered   Past Medical History:  Diagnosis Date  . Aortic valve regurgitation   . Atrial fibrillation ([place])   . Barrett's esophagus   . COPD (chronic obstructive pulmonary disease) ([place])   . Diverticulosis   . Gout   . Headache    aura only  . Hematuria   . History of prosthetic heart valve   . Hyperlipidemia   . Hypertension   . Nodular prostate with urinary obstruction   . Substance abuse ([place])    alcohol   Past Surgical History:  Procedure Laterality Date  . CARDIAC VALVE REPLACEMENT N/A 2004  . CARPAL TUNNEL RELEASE Right 2015  . CATARACT EXTRACTION Right 2017  . COLONOSCOPY WITH PROPOFOL N/A 2014  . CORONARY ANGIOPLASTY     stints x2  . EYE SURGERY     cross eyed  . FOOT SURGERY Bilateral    [place]  . FRACTURE SURGERY Right    [place]  . INGUINAL HERNIA REPAIR Right 09/16/2017   Procedure: HERNIA REPAIR INGUINAL ADULT;  Surgeon: Christene Lye, MD;  Location: [facility] ORS;  Service: General;  Laterality: Right;  . INSERT / REPLACE / [place]     ICD  . PACEMAKER IMPLANT Left 2013   and ICD  . VASECTOMY     Family History  Problem Relation Age of Onset  . Heart attack Mother   . Diabetes Maternal Grandmother   . Lung cancer  Sister    Social History   Substance and Sexual Activity  Sexual Activity No    Outpatient Encounter Medications as of 10/13/2017  Medication Sig  . acetaminophen (TYLENOL) 650 MG CR tablet Take 650 mg by mouth 2 (two) times daily.  Marland Kitchen aspirin EC 81 MG tablet Take 81 mg by mouth daily.  . colchicine 0.6 MG tablet Take 1 tablet (0.6 mg total) by mouth daily. (Patient taking differently: Take 0.6 mg by mouth daily as needed (for gout). )  . desonide (DESOWEN) 0.05 % lotion Apply 1 application topically daily.  Marland Kitchen diltiazem (DILACOR XR) 180 MG 24 hr capsule TAKE 1 CAPSULE BY MOUTH ONCE DAILY (Patient taking differently: Take 180 mg by mouth daily)  . empagliflozin (JARDIANCE) 10 MG TABS tablet Take 10 mg by mouth daily.  . febuxostat (ULORIC) 40 MG tablet Take 1 tablet (40 mg total) by mouth daily.  . ferrous sulfate 325 (65 FE) MG tablet Take 325 mg by mouth every Wednesday.   . fexofenadine (ALLEGRA) 180 MG tablet Take 180 mg by mouth daily.  . finasteride (PROSCAR) 5 MG tablet Take 5 mg by mouth daily.  . fluticasone (FLONASE) 50 MCG/ACT nasal spray Place 2 sprays into both nostrils daily.  Marland Kitchen  furosemide (LASIX) 40 MG tablet Take 40 mg by mouth daily.   Marland Kitchen ketoconazole (NIZORAL) 2 % shampoo Apply 1 application topically 2 (two) times a week.   . losartan (COZAAR) 25 MG tablet Take 1 tablet (25 mg total) by mouth daily.  . Magnesium 400 MG TABS Take 400 mg by mouth daily.   . metoprolol succinate (TOPROL-XL) 50 MG 24 hr tablet Take 1 tablet (50 mg total) by mouth 2 (two) times daily. Take with or immediately following a meal.  . Multiple Vitamins-Minerals (CENTRUM ADULTS PO) Take 1 tablet by mouth daily.   Marland Kitchen omeprazole (PRILOSEC) 20 MG capsule Take 1 capsule (20 mg total) by mouth 2 (two) times daily before a meal. (Patient taking differently: Take 20 mg by mouth daily. )  . Polyethyl Glycol-Propyl Glycol (SYSTANE OP) Place 1 drop into both eyes as needed (for dry eyes).  . tamsulosin  (FLOMAX) 0.4 MG CAPS capsule Take 0.4 mg by mouth daily.  Marland Kitchen tiotropium (SPIRIVA) 18 MCG inhalation capsule Place 1 capsule (18 mcg total) into inhaler and inhale daily. (Patient taking differently: Place 18 mcg into inhaler and inhale daily as needed (chest congestion). )  . warfarin (COUMADIN) 3 MG tablet   . warfarin (COUMADIN) 4 MG tablet    No facility-administered encounter medications on file as of 10/13/2017.     Activities of Daily Living In your present state of health, do you have any difficulty performing the following activities: 10/13/2017 09/01/2017  Hearing? Y N  Comment has tinnitus  -  Vision? Y N  Difficulty concentrating or making decisions? N N  Walking or climbing stairs? Y N  Dressing or bathing? N N  Doing errands, shopping? N N  Preparing Food and eating ? N -  Using the Toilet? N -  In the past six months, have you accidently leaked urine? N -  Do you have problems with loss of bowel control? N -  Managing your Medications? N -  Managing your Finances? N -  Housekeeping or managing your Housekeeping? N -  Some recent data might be hidden    Patient Care Team: Guadalupe Maple, MD as PCP - General (Family Medicine) Murlean Iba, MD (Internal Medicine) Isaias Cowman, MD as Consulting Physician (Cardiology) Christene Lye, MD (General Surgery)   Assessment:     Exercise Activities and Dietary recommendations Current Exercise Habits: The patient does not participate in regular exercise at present, Exercise limited by: None identified  Goals    None     Fall Risk Fall Risk  10/13/2017 07/27/2017 01/27/2017 12/30/2016 12/14/2016  Falls in the past year? Yes Yes Yes Yes Yes  Number falls in past yr: 2 or more 1 1 1 1   Injury with Fall? No No No No No  Risk Factor Category  High Fall Risk - - - -  Risk for fall due to : History of fall(s);Impaired balance/gait History of fall(s) - - -  Follow up Falls prevention discussed - - - -    Depression Screen PHQ 2/9 Scores 10/13/2017 07/27/2017 12/14/2016 10/11/2016  PHQ - 2 Score 0 0 0 0  PHQ- 9 Score 2 - - -    Cognitive Function     6CIT Screen 10/13/2017  What Year? 0 points  What month? 0 points  What time? 0 points  Count back from 20 0 points  Months in reverse 0 points  Repeat phrase 0 points  Total Score 0    Immunization History  Administered Date(s) Administered  . Influenza, High Dose Seasonal PF 09/20/2016, 10/07/2017  . Influenza,inj,Quad PF,6+ Mos 09/02/2015  . Pneumococcal Conjugate-13 10/06/2015  . Pneumococcal-Unspecified 08/07/2009, 09/30/2011  . Tdap 08/06/2014  . Zoster 07/27/2013   Screening Tests Health Maintenance  Topic Date Due  . TETANUS/TDAP  08/06/2024  . INFLUENZA VACCINE  Completed  . PNA vac Low Risk Adult  Completed      Plan:     I have personally reviewed and addressed the Medicare Annual Wellness questionnaire and have noted the following in the patient's chart:  A. Medical and social history B. Use of alcohol, tobacco or illicit drugs  C. Current medications and supplements D. Functional ability and status E.  Nutritional status F.  Physical activity G. Advance directives H. List of other physicians I.  Hospitalizations, surgeries, and ER visits in previous 12 months J.  [place] such as hearing and vision if needed, cognitive and depression L. Referrals and appointments   In addition, I have reviewed and discussed with patient certain preventive protocols, quality metrics, and best practice recommendations. A written personalized care plan for preventive services as well as general preventive health recommendations were provided to patient.   Signed,  Tyler Aas, LPN Nurse Health Advisor   Nurse Notes:none

## 2017-10-14 MED ORDER — FLUTICASONE PROPIONATE 50 MCG/ACT NA SUSP: 2.0000 | Freq: Every day | NASAL | 12 refills | Status: AC

## 2017-10-19 ENCOUNTER — Ambulatory Visit (INDEPENDENT_AMBULATORY_CARE_PROVIDER_SITE_OTHER): Payer: Medicare Other | Admitting: Family Medicine

## 2017-10-19 ENCOUNTER — Encounter: Payer: Self-pay | Admitting: Family Medicine

## 2017-10-19 VITALS — BP 144/77 | HR 65 | Wt 157.0 lb

## 2017-10-19 DIAGNOSIS — E7800 Pure hypercholesterolemia, unspecified: Secondary | ICD-10-CM | POA: Diagnosis not present

## 2017-10-19 DIAGNOSIS — Z0001 Encounter for general adult medical examination with abnormal findings: Secondary | ICD-10-CM

## 2017-10-19 DIAGNOSIS — J449 Chronic obstructive pulmonary disease, unspecified: Secondary | ICD-10-CM

## 2017-10-19 DIAGNOSIS — Z952 Presence of prosthetic heart valve: Secondary | ICD-10-CM

## 2017-10-19 DIAGNOSIS — Z7189 Other specified counseling: Secondary | ICD-10-CM | POA: Insufficient documentation

## 2017-10-19 DIAGNOSIS — D692 Other nonthrombocytopenic purpura: Secondary | ICD-10-CM

## 2017-10-19 DIAGNOSIS — N138 Other obstructive and reflux uropathy: Secondary | ICD-10-CM | POA: Diagnosis not present

## 2017-10-19 DIAGNOSIS — I2510 Atherosclerotic heart disease of native coronary artery without angina pectoris: Secondary | ICD-10-CM

## 2017-10-19 DIAGNOSIS — Z0000 Encounter for general adult medical examination without abnormal findings: Secondary | ICD-10-CM

## 2017-10-19 DIAGNOSIS — I10 Essential (primary) hypertension: Secondary | ICD-10-CM

## 2017-10-19 DIAGNOSIS — N403 Nodular prostate with lower urinary tract symptoms: Secondary | ICD-10-CM | POA: Diagnosis not present

## 2017-10-19 MED ORDER — DILTIAZEM HCL ER 180 MG PO CP24: ORAL_CAPSULE | ORAL | 4 refills | Status: DC

## 2017-10-19 MED ORDER — LOSARTAN POTASSIUM 25 MG PO TABS: 25.0000 mg | ORAL_TABLET | Freq: Every day | ORAL | 4 refills | Status: DC

## 2017-10-19 MED ORDER — OMEPRAZOLE 20 MG PO CPDR: 20.0000 mg | DELAYED_RELEASE_CAPSULE | Freq: Every day | ORAL | 4 refills | Status: DC

## 2017-10-19 MED ORDER — TIOTROPIUM BROMIDE MONOHYDRATE 18 MCG IN CAPS: 18.0000 ug | ORAL_CAPSULE | Freq: Every day | RESPIRATORY_TRACT | 4 refills | Status: DC

## 2017-10-19 MED ORDER — FEBUXOSTAT 40 MG PO TABS: 40.0000 mg | ORAL_TABLET | Freq: Every day | ORAL | 4 refills | Status: DC

## 2017-10-19 MED ORDER — METOPROLOL SUCCINATE ER 50 MG PO TB24: ORAL_TABLET | ORAL | 4 refills | Status: DC

## 2017-10-19 NOTE — Assessment & Plan Note (Signed)
Stable

## 2017-10-19 NOTE — Assessment & Plan Note (Signed)
The current medical regimen is effective;  continue present plan and medications.  

## 2017-10-19 NOTE — Progress Notes (Signed)
BP (!) 144/77   Pulse 65   Wt 157 lb (71.2 kg)   SpO2 99%   BMI 26.95 kg/m    Subjective:    [name], male    DOB: 09/28/1941, 76 y.o.   MRN: [id number]  HPI: Kirk Jones is a 76 y.o. male  Annual exam  Patient follow-up all in all doing well reviewed medications and stable first of all taking warfarin 3.5 mg with 4 mg alternating. This is done by taking a 3 mg and a half of a1 mg pill.. INR seem to be stable. Taking Jardiance through cardiology and the research study.Blood pressure medications doing well prostate medications doing well. Breathing also doing okay.  Relevant past medical, surgical, family and social history reviewed and updated as indicated. Interim medical history since our last visit reviewed. Allergies and medications reviewed and updated.  Review of Systems  Constitutional: Negative.   HENT: Negative.   Eyes: Negative.   Respiratory: Negative.   Cardiovascular: Negative.   Gastrointestinal: Negative.   Endocrine: Negative.   Genitourinary: Negative.   Musculoskeletal: Negative.   Skin: Negative.   Allergic/Immunologic: Negative.   Neurological: Negative.   Hematological: Negative.   Psychiatric/Behavioral: Negative.     Per HPI unless specifically indicated above     Objective:    BP (!) 144/77   Pulse 65   Wt 157 lb (71.2 kg)   SpO2 99%   BMI 26.95 kg/m   Wt Readings from Last 3 Encounters:  10/19/17 157 lb (71.2 kg)  10/13/17 157 lb 8 oz (71.4 kg)  10/07/17 157 lb 12.8 oz (71.6 kg)    Physical Exam  Constitutional: He is oriented to person, place, and time. He appears well-developed and well-nourished.  HENT:  Head: Normocephalic and atraumatic.  Right Ear: External ear normal.  Left Ear: External ear normal.  Eyes: Conjunctivae and EOM are normal. Pupils are equal, round, and reactive to light.  Neck: Normal range of motion. Neck supple.  Cardiovascular: Normal rate, regular rhythm, normal heart  sounds and intact distal pulses.  Pulmonary/Chest: Effort normal and breath sounds normal.  Abdominal: Soft. Bowel sounds are normal. There is no splenomegaly or hepatomegaly.  Genitourinary: Rectum normal, prostate normal and penis normal.  Musculoskeletal: Normal range of motion.  Neurological: He is alert and oriented to person, place, and time. He has normal reflexes.  Skin: No rash noted. No erythema.  Psychiatric: He has a normal mood and affect. His behavior is normal. Judgment and thought content normal.    Results for orders placed or performed in visit on 10/07/17  CoaguChek XS/INR Waived  Result Value Ref Range   INR 2.5 (H) 0.9 - 1.1   Prothrombin Time 29.5 sec      Assessment & Plan:   Problem List Items Addressed This Visit      Cardiovascular and Mediastinum   Hypertension    The current medical regimen is effective;  continue present plan and medications.       CAD (coronary artery disease)    The current medical regimen is effective;  continue present plan and medications.       Senile purpura (HCC)    Stable        Respiratory   COPD (chronic obstructive pulmonary disease) ([place])    The current medical regimen is effective;  continue present plan and medications.         Genitourinary   Nodular prostate with urinary obstruction  The current medical regimen is effective;  continue present plan and medications.         Other   History of prosthetic heart valve (Chronic)    Table on warfarin with INR 2.5-3.5.      Hyperlipidemia    The current medical regimen is effective;  continue present plan and medications.       Advanced care planning/counseling discussion    A voluntary discussion about advance care planning including the explanation and discussion of advance directives was extensively discussed  with the patient.  Explanation about the health care proxy and Living will was reviewed and packet with forms with explanation of how to  fill them out was given.        Other Visit Diagnoses    PE (physical exam), annual    -  Primary     patient also follow-up for his monthly INR checks  Follow up plan: Return in about 6 months (around 04/18/2018) for BMP,  Lipids, ALT, AST.

## 2017-10-19 NOTE — Assessment & Plan Note (Signed)
Table on warfarin with INR 2.5-3.5.

## 2017-10-19 NOTE — Assessment & Plan Note (Signed)
A voluntary discussion about advance care planning including the explanation and discussion of advance directives was extensively discussed  with the patient.  Explanation about the health care proxy and Living will was reviewed and packet with forms with explanation of how to fill them out was given.    

## 2017-10-24 ENCOUNTER — Other Ambulatory Visit: Payer: Medicare Other

## 2017-10-24 DIAGNOSIS — J449 Chronic obstructive pulmonary disease, unspecified: Secondary | ICD-10-CM | POA: Diagnosis not present

## 2017-10-24 DIAGNOSIS — D692 Other nonthrombocytopenic purpura: Secondary | ICD-10-CM | POA: Diagnosis not present

## 2017-10-24 DIAGNOSIS — I10 Essential (primary) hypertension: Secondary | ICD-10-CM | POA: Diagnosis not present

## 2017-10-24 DIAGNOSIS — I2510 Atherosclerotic heart disease of native coronary artery without angina pectoris: Secondary | ICD-10-CM | POA: Diagnosis not present

## 2017-10-24 LAB — URINALYSIS, ROUTINE W REFLEX MICROSCOPIC
BILIRUBIN UA: NEGATIVE
GLUCOSE, UA: NEGATIVE
NITRITE UA: NEGATIVE
Protein, UA: NEGATIVE
Specific Gravity, UA: 1.01 (ref 1.005–1.030)
UUROB: 0.2 mg/dL (ref 0.2–1.0)
pH, UA: 5.5 (ref 5.0–7.5)

## 2017-10-24 LAB — MICROSCOPIC EXAMINATION: BACTERIA UA: NONE SEEN

## 2017-10-25 LAB — CBC WITH DIFFERENTIAL/PLATELET
BASOS ABS: 0.1 10*3/uL (ref 0.0–0.2)
BASOS: 2 %
EOS (ABSOLUTE): 0.1 10*3/uL (ref 0.0–0.4)
Eos: 2 %
Hematocrit: 38.2 % (ref 37.5–51.0)
Hemoglobin: 12.9 g/dL — ABNORMAL LOW (ref 13.0–17.7)
IMMATURE GRANS (ABS): 0 10*3/uL (ref 0.0–0.1)
IMMATURE GRANULOCYTES: 0 %
LYMPHS: 27 %
Lymphocytes Absolute: 1.4 10*3/uL (ref 0.7–3.1)
MCH: 33.6 pg — ABNORMAL HIGH (ref 26.6–33.0)
MCHC: 33.8 g/dL (ref 31.5–35.7)
MCV: 100 fL — ABNORMAL HIGH (ref 79–97)
Monocytes Absolute: 0.6 10*3/uL (ref 0.1–0.9)
Monocytes: 12 %
NEUTROS PCT: 57 %
Neutrophils Absolute: 3 10*3/uL (ref 1.4–7.0)
PLATELETS: 281 10*3/uL (ref 150–379)
RBC: 3.84 x10E6/uL — ABNORMAL LOW (ref 4.14–5.80)
RDW: 14.3 % (ref 12.3–15.4)
WBC: 5.2 10*3/uL (ref 3.4–10.8)

## 2017-10-25 LAB — COMPREHENSIVE METABOLIC PANEL
A/G RATIO: 1.9 (ref 1.2–2.2)
ALT: 12 IU/L (ref 0–44)
AST: 33 IU/L (ref 0–40)
Albumin: 4.4 g/dL (ref 3.5–4.8)
Alkaline Phosphatase: 62 IU/L (ref 39–117)
BUN/Creatinine Ratio: 20 (ref 10–24)
BUN: 28 mg/dL — ABNORMAL HIGH (ref 8–27)
Bilirubin Total: 0.5 mg/dL (ref 0.0–1.2)
CALCIUM: 9.6 mg/dL (ref 8.6–10.2)
CHLORIDE: 89 mmol/L — AB (ref 96–106)
CO2: 22 mmol/L (ref 20–29)
Creatinine, Ser: 1.43 mg/dL — ABNORMAL HIGH (ref 0.76–1.27)
GFR calc Af Amer: 55 mL/min/{1.73_m2} — ABNORMAL LOW (ref >59)
GFR, EST NON AFRICAN AMERICAN: 47 mL/min/{1.73_m2} — AB (ref >59)
GLUCOSE: 77 mg/dL (ref 65–99)
Globulin, Total: 2.3 g/dL (ref 1.5–4.5)
POTASSIUM: 4.4 mmol/L (ref 3.5–5.2)
Sodium: 131 mmol/L — ABNORMAL LOW (ref 134–144)
Total Protein: 6.7 g/dL (ref 6.0–8.5)

## 2017-10-25 LAB — LIPID PANEL
CHOLESTEROL TOTAL: 187 mg/dL (ref 100–199)
Chol/HDL Ratio: 1.6 ratio (ref 0.0–5.0)
HDL: 118 mg/dL (ref >39)
LDL Calculated: 57 mg/dL (ref 0–99)
TRIGLYCERIDES: 61 mg/dL (ref 0–149)
VLDL CHOLESTEROL CAL: 12 mg/dL (ref 5–40)

## 2017-10-25 LAB — TSH: TSH: 2.4 u[IU]/mL (ref 0.450–4.500)

## 2017-10-25 NOTE — Telephone Encounter (Signed)
Phone call Discussed with patient slight decline in renal function after surgery along with slight decline of hemoglobin will recheck in a month at next INR with CBC, and BMP

## 2017-10-27 ENCOUNTER — Ambulatory Visit (INDEPENDENT_AMBULATORY_CARE_PROVIDER_SITE_OTHER): Payer: Medicare Other | Admitting: General Surgery

## 2017-10-27 ENCOUNTER — Encounter: Payer: Self-pay | Admitting: General Surgery

## 2017-10-27 VITALS — BP 122/68 | HR 61 | Resp 14 | Ht 63.0 in | Wt 161.0 lb

## 2017-10-27 DIAGNOSIS — Z95810 Presence of automatic (implantable) cardiac defibrillator: Secondary | ICD-10-CM | POA: Diagnosis not present

## 2017-10-27 DIAGNOSIS — I6300 Cerebral infarction due to thrombosis of unspecified precerebral artery: Secondary | ICD-10-CM | POA: Diagnosis not present

## 2017-10-27 DIAGNOSIS — Z952 Presence of prosthetic heart valve: Secondary | ICD-10-CM | POA: Diagnosis not present

## 2017-10-27 DIAGNOSIS — I2510 Atherosclerotic heart disease of native coronary artery without angina pectoris: Secondary | ICD-10-CM | POA: Diagnosis not present

## 2017-10-27 DIAGNOSIS — I472 Ventricular tachycardia: Secondary | ICD-10-CM | POA: Diagnosis not present

## 2017-10-27 DIAGNOSIS — K4090 Unilateral inguinal hernia, without obstruction or gangrene, not specified as recurrent: Secondary | ICD-10-CM

## 2017-10-27 DIAGNOSIS — I480 Paroxysmal atrial fibrillation: Secondary | ICD-10-CM | POA: Diagnosis not present

## 2017-10-27 DIAGNOSIS — Z8673 Personal history of transient ischemic attack (TIA), and cerebral infarction without residual deficits: Secondary | ICD-10-CM | POA: Diagnosis not present

## 2017-10-27 DIAGNOSIS — I5022 Chronic systolic (congestive) heart failure: Secondary | ICD-10-CM | POA: Diagnosis not present

## 2017-10-27 DIAGNOSIS — J449 Chronic obstructive pulmonary disease, unspecified: Secondary | ICD-10-CM | POA: Diagnosis not present

## 2017-10-27 DIAGNOSIS — Z7901 Long term (current) use of anticoagulants: Secondary | ICD-10-CM | POA: Diagnosis not present

## 2017-10-27 NOTE — Patient Instructions (Signed)
Return as needed

## 2017-10-27 NOTE — Progress Notes (Signed)
[name] [facility], male   DOB: July 25, 1941, 76 y.o.   MRN: [id number]  Chief Complaint  Patient presents with  . Routine Post Op    HPI Kirk Jones is a 76 y.o. male.  Here today for postoperative visit, right inguinal hernia 09-16-17 he states he is doing well. Denies any gastrointestinal issues, bowels are moving regular.  HPI  Past Medical History:  Diagnosis Date  . Aortic valve regurgitation   . Atrial fibrillation ([place])   . Barrett's esophagus   . COPD (chronic obstructive pulmonary disease) ([place])   . Diverticulosis   . Gout   . Headache    aura only  . Hematuria   . History of prosthetic heart valve   . Hyperlipidemia   . Hypertension   . Nodular prostate with urinary obstruction   . Substance abuse ([place])    alcohol    Past Surgical History:  Procedure Laterality Date  . CARDIAC VALVE REPLACEMENT N/A 2004  . CARPAL TUNNEL RELEASE Right 2015  . CATARACT EXTRACTION Right 2017  . COLONOSCOPY WITH PROPOFOL N/A 2014  . CORONARY ANGIOPLASTY     stints x2  . EYE SURGERY     cross eyed  . FOOT SURGERY Bilateral    [place]  . FRACTURE SURGERY Right    fem[place]  . INGUINAL HERNIA REPAIR Right 09/16/2017   Procedure: HERNIA REPAIR INGUINAL ADULT;  Surgeon: Christene Lye, MD;  Location: [facility] ORS;  Service: General;  Laterality: Right;  . INSERT / REPLACE / [place]     ICD  . PACEMAKER IMPLANT Left 2013   and ICD  . VASECTOMY      Family History  Problem Relation Age of Onset  . Heart attack Mother   . Diabetes Maternal Grandmother   . Lung cancer Sister     Social History Social History   Tobacco Use  . Smoking status: Former Smoker    Types: Cigarettes    Last attempt to quit: 04/24/1991    Years since quitting: 26.5  . Smokeless tobacco: Never Used  Substance Use Topics  . Alcohol use: Yes    Alcohol/week: 14.4 oz    Types: 24 Shots of liquor per week    Comment: quit December 2015/restarted  . Drug use: No     Allergies  Allergen Reactions  . Prednisone Other (See Comments)    Reaction: "organs shutting down"    Current Outpatient Medications  Medication Sig Dispense Refill  . acetaminophen (TYLENOL) 650 MG CR tablet Take 650 mg by mouth 2 (two) times daily.    Marland Kitchen aspirin EC 81 MG tablet Take 81 mg by mouth daily.    . colchicine 0.6 MG tablet Take 1 tablet (0.6 mg total) by mouth daily. (Patient taking differently: Take 0.6 mg by mouth daily as needed (for gout). ) 30 tablet 1  . desonide (DESOWEN) 0.05 % lotion Apply 1 application topically daily.    Marland Kitchen diltiazem (DILACOR XR) 180 MG 24 hr capsule Take 180 mg by mouth daily 90 capsule 4  . empagliflozin (JARDIANCE) 10 MG TABS tablet Take 10 mg by mouth daily.    . febuxostat (ULORIC) 40 MG tablet Take 1 tablet (40 mg total) by mouth daily. 90 tablet 4  . ferrous sulfate 325 (65 FE) MG tablet Take 325 mg by mouth every Wednesday.     . fexofenadine (ALLEGRA) 180 MG tablet Take 180 mg by mouth daily.    . finasteride (PROSCAR) 5 MG  tablet Take 5 mg by mouth daily.    . fluticasone (FLONASE) 50 MCG/ACT nasal spray Place 2 sprays daily into both nostrils. 16 g 12  . furosemide (LASIX) 40 MG tablet Take 40 mg by mouth daily.     Marland Kitchen ketoconazole (NIZORAL) 2 % shampoo Apply 1 application topically 2 (two) times a week.     . losartan (COZAAR) 25 MG tablet Take 1 tablet (25 mg total) by mouth daily. 90 tablet 4  . Magnesium 400 MG TABS Take 400 mg by mouth daily.     . metoprolol succinate (TOPROL-XL) 50 MG 24 hr tablet TAKE 1 TABLET BY MOUTH TWICE DAILY WITH MEALS OR IMMEDIATELY FOLLOWING MEAL 180 tablet 4  . Multiple Vitamins-Minerals (CENTRUM ADULTS PO) Take 1 tablet by mouth daily.     Marland Kitchen omeprazole (PRILOSEC) 20 MG capsule Take 1 capsule (20 mg total) by mouth daily. 90 capsule 4  . Polyethyl Glycol-Propyl Glycol (SYSTANE OP) Place 1 drop into both eyes as needed (for dry eyes).    . tamsulosin (FLOMAX) 0.4 MG CAPS capsule Take 0.4 mg by mouth  daily.    Marland Kitchen tiotropium (SPIRIVA) 18 MCG inhalation capsule Place 1 capsule (18 mcg total) into inhaler and inhale daily. 90 capsule 4  . warfarin (COUMADIN) 3 MG tablet   2  . warfarin (COUMADIN) 4 MG tablet   0   No current facility-administered medications for this visit.     Review of Systems Review of Systems  Constitutional: Negative.   Respiratory: Negative.   Cardiovascular: Negative.   Gastrointestinal: Negative for constipation and diarrhea.    Blood pressure 122/68, pulse 61, resp. rate 14, height 5\' 3"  (1.6 m), weight 161 lb (73 kg).  Physical Exam Physical Exam  Constitutional: He is oriented to person, place, and time. He appears well-developed and well-nourished.  Eyes: Conjunctivae are normal. No scleral icterus.  Pulmonary/Chest: Effort normal.  Abdominal: Soft. Bowel sounds are normal. He exhibits no mass. There is no tenderness. No hernia. Hernia confirmed negative in the right inguinal area and confirmed negative in the left inguinal area.    Neurological: He is alert and oriented to person, place, and time.  Skin: Skin is warm and dry.    Data Reviewed  Prior notes, op note reviewed  Assessment    S/p right inguinal hernia repair with Progrip mesh done on 09/16/2017. Incision is healing appropriately without evidence of recurrence. Good post-op course.     Plan       Patient to return as needed. May gradually return to normal physical activity. The patient is aware to call back for any questions or concerns.   HPI, Physical Exam, Assessment and Plan have been scribed under the direction and in the presence of Mckinley Jewel, MD Karie Fetch, RN   I have completed the exam and reviewed the above documentation for accuracy and completeness.  I agree with the above.  Haematologist has been used and any errors in dictation or transcription are unintentional.  Shelbee Apgar G. Jamal Collin, M.D., F.A.C.S.  Junie Panning G 10/27/2017, 10:02  AM

## 2017-11-03 ENCOUNTER — Other Ambulatory Visit: Payer: Self-pay | Admitting: Unknown Physician Specialty

## 2017-11-04 NOTE — Telephone Encounter (Signed)
Your patient.  Thanks 

## 2017-11-08 ENCOUNTER — Ambulatory Visit: Payer: Medicare Other | Admitting: Family Medicine

## 2017-11-10 ENCOUNTER — Ambulatory Visit (INDEPENDENT_AMBULATORY_CARE_PROVIDER_SITE_OTHER): Payer: Medicare Other | Admitting: Family Medicine

## 2017-11-10 ENCOUNTER — Encounter: Payer: Self-pay | Admitting: Family Medicine

## 2017-11-10 VITALS — BP 122/88 | HR 77 | Wt 160.0 lb

## 2017-11-10 DIAGNOSIS — D649 Anemia, unspecified: Secondary | ICD-10-CM | POA: Diagnosis not present

## 2017-11-10 DIAGNOSIS — I482 Chronic atrial fibrillation, unspecified: Secondary | ICD-10-CM

## 2017-11-10 DIAGNOSIS — I10 Essential (primary) hypertension: Secondary | ICD-10-CM

## 2017-11-10 DIAGNOSIS — I639 Cerebral infarction, unspecified: Secondary | ICD-10-CM

## 2017-11-10 LAB — COAGUCHEK XS/INR WAIVED
INR: 2.9 — ABNORMAL HIGH (ref 0.9–1.1)
Prothrombin Time: 34.4 s

## 2017-11-10 NOTE — Assessment & Plan Note (Signed)
The current medical regimen is effective;  continue present plan and medications.  

## 2017-11-10 NOTE — Progress Notes (Signed)
BP 122/88 (BP Location: Left Arm)   Pulse 77   Wt 160 lb (72.6 kg)   SpO2 99%   BMI 28.34 kg/m    Subjective:    [name], male    DOB: [birth year], 76 y.o.   MRN: [id number]  HPI: Kirk Jones is a 76 y.o. male  Chief Complaint  Patient presents with  . Follow-up  . Blister    ON back  Patient follow-up doing well not eating as much but taking warfarin without problems 3-1/2 mg alternating 4 mg without problems bleeding bruising episodes. INR seems to be stable. Has small blister on itchy spots on his back developed from heating pad has been healing well. Reviewed blister healing nicely with no secondary infection will observe.. BP stable no problems Also rechecking lab work because of low electrolytes hemoglobin andrenal function after surgery. Relevant past medical, surgical, family and social history reviewed and updated as indicated. Interim medical history since our last visit reviewed. Allergies and medications reviewed and updated.  Review of Systems  Constitutional: Negative.   Respiratory: Negative.   Cardiovascular: Negative.     Per HPI unless specifically indicated above     Objective:    BP 122/88 (BP Location: Left Arm)   Pulse 77   Wt 160 lb (72.6 kg)   SpO2 99%   BMI 28.34 kg/m   Wt Readings from Last 3 Encounters:  11/10/17 160 lb (72.6 kg)  10/27/17 161 lb (73 kg)  10/19/17 157 lb (71.2 kg)    Physical Exam  Constitutional: He is oriented to person, place, and time. He appears well-developed and well-nourished.  HENT:  Head: Normocephalic and atraumatic.  Eyes: Conjunctivae and EOM are normal.  Neck: Normal range of motion.  Cardiovascular: Normal rate, regular rhythm and normal heart sounds.  Pulmonary/Chest: Effort normal and breath sounds normal.  Musculoskeletal: Normal range of motion.  Neurological: He is alert and oriented to person, place, and time.  Skin: No erythema.  Healing back lesion    Psychiatric: He has a normal mood and affect. His behavior is normal. Judgment and thought content normal.    Results for orders placed or performed in visit on 10/19/17  Microscopic Examination  Result Value Ref Range   WBC, UA 0-5 0 - 5 /hpf   RBC, UA 0-2 0 - 2 /hpf   Epithelial Cells (non renal) 0-10 0 - 10 /hpf   Renal Epithel, UA 0-10 (A) None seen /hpf   Bacteria, UA None seen None seen/Few  Comprehensive metabolic panel  Result Value Ref Range   Glucose 77 65 - 99 mg/dL   BUN 28 (H) 8 - 27 mg/dL   Creatinine, Ser 1.43 (H) 0.76 - 1.27 mg/dL   GFR calc non Af Amer 47 (L) >59 mL/min/1.73   GFR calc Af Amer 55 (L) >59 mL/min/1.73   BUN/Creatinine Ratio 20 10 - 24   Sodium 131 (L) 134 - 144 mmol/L   Potassium 4.4 3.5 - 5.2 mmol/L   Chloride 89 (L) 96 - 106 mmol/L   CO2 22 20 - 29 mmol/L   Calcium 9.6 8.6 - 10.2 mg/dL   Total Protein 6.7 6.0 - 8.5 g/dL   Albumin 4.4 3.5 - 4.8 g/dL   Globulin, Total 2.3 1.5 - 4.5 g/dL   Albumin/Globulin Ratio 1.9 1.2 - 2.2   Bilirubin Total 0.5 0.0 - 1.2 mg/dL   Alkaline Phosphatase 62 39 - 117 IU/L   AST 33 0 -  40 IU/L   ALT 12 0 - 44 IU/L  CBC with Differential/Platelet  Result Value Ref Range   WBC 5.2 3.4 - 10.8 x10E3/uL   RBC 3.84 (L) 4.14 - 5.80 x10E6/uL   Hemoglobin 12.9 (L) 13.0 - 17.7 g/dL   Hematocrit 38.2 37.5 - 51.0 %   MCV 100 (H) 79 - 97 fL   MCH 33.6 (H) 26.6 - 33.0 pg   MCHC 33.8 31.5 - 35.7 g/dL   RDW 14.3 12.3 - 15.4 %   Platelets 281 150 - 379 x10E3/uL   Neutrophils 57 Not Estab. %   Lymphs 27 Not Estab. %   Monocytes 12 Not Estab. %   Eos 2 Not Estab. %   Basos 2 Not Estab. %   Neutrophils Absolute 3.0 1.4 - 7.0 x10E3/uL   Lymphocytes Absolute 1.4 0.7 - 3.1 x10E3/uL   Monocytes Absolute 0.6 0.1 - 0.9 x10E3/uL   EOS (ABSOLUTE) 0.1 0.0 - 0.4 x10E3/uL   Basophils Absolute 0.1 0.0 - 0.2 x10E3/uL   Immature Granulocytes 0 Not Estab. %   Immature Grans (Abs) 0.0 0.0 - 0.1 x10E3/uL  Lipid panel  Result Value Ref  Range   Cholesterol, Total 187 100 - 199 mg/dL   Triglycerides 61 0 - 149 mg/dL   HDL 118 >39 mg/dL   VLDL Cholesterol Cal 12 5 - 40 mg/dL   LDL Calculated 57 0 - 99 mg/dL   Chol/HDL Ratio 1.6 0.0 - 5.0 ratio  TSH  Result Value Ref Range   TSH 2.400 0.450 - 4.500 uIU/mL  Urinalysis, Routine w reflex microscopic  Result Value Ref Range   Specific Gravity, UA 1.010 1.005 - 1.030   pH, UA 5.5 5.0 - 7.5   Color, UA Yellow Yellow   Appearance Ur Clear Clear   Leukocytes, UA Trace (A) Negative   Protein, UA Negative Negative/Trace   Glucose, UA Negative Negative   Ketones, UA Trace (A) Negative   RBC, UA Trace (A) Negative   Bilirubin, UA Negative Negative   Urobilinogen, Ur 0.2 0.2 - 1.0 mg/dL   Nitrite, UA Negative Negative   Microscopic Examination See below:       Assessment & Plan:   Problem List Items Addressed This Visit      Cardiovascular and Mediastinum   Atrial fibrillation (H[facility]) - Primary (Chronic)    The current medical regimen is effective;  continue present plan and medications.       Relevant Orders   CoaguChek XS/INR Waived   Hypertension    The current medical regimen is effective;  continue present plan and medications.        Other Visit Diagnoses    Anemia, unspecified type       Relevant Orders   CoaguChek XS/INR Waived       Follow up plan: Return in about 4 weeks (around 12/08/2017) for PT INR.

## 2017-11-11 ENCOUNTER — Encounter: Payer: Self-pay | Admitting: Family Medicine

## 2017-11-11 LAB — BASIC METABOLIC PANEL
BUN/Creatinine Ratio: 20 (ref 10–24)
BUN: 21 mg/dL (ref 8–27)
CHLORIDE: 94 mmol/L — AB (ref 96–106)
CO2: 25 mmol/L (ref 20–29)
Calcium: 9.5 mg/dL (ref 8.6–10.2)
Creatinine, Ser: 1.03 mg/dL (ref 0.76–1.27)
GFR calc Af Amer: 81 mL/min/{1.73_m2} (ref >59)
GFR, EST NON AFRICAN AMERICAN: 70 mL/min/{1.73_m2} (ref >59)
GLUCOSE: 128 mg/dL — AB (ref 65–99)
POTASSIUM: 4.3 mmol/L (ref 3.5–5.2)
SODIUM: 136 mmol/L (ref 134–144)

## 2017-11-11 LAB — CBC WITH DIFFERENTIAL/PLATELET
BASOS ABS: 0.1 10*3/uL (ref 0.0–0.2)
BASOS: 1 %
EOS (ABSOLUTE): 0.1 10*3/uL (ref 0.0–0.4)
Eos: 1 %
Hematocrit: 40.8 % (ref 37.5–51.0)
Hemoglobin: 13.6 g/dL (ref 13.0–17.7)
IMMATURE GRANULOCYTES: 0 %
Immature Grans (Abs): 0 10*3/uL (ref 0.0–0.1)
Lymphocytes Absolute: 0.8 10*3/uL (ref 0.7–3.1)
Lymphs: 11 %
MCH: 34.3 pg — ABNORMAL HIGH (ref 26.6–33.0)
MCHC: 33.3 g/dL (ref 31.5–35.7)
MCV: 103 fL — AB (ref 79–97)
MONOS ABS: 0.8 10*3/uL (ref 0.1–0.9)
Monocytes: 12 %
NEUTROS PCT: 75 %
Neutrophils Absolute: 5.1 10*3/uL (ref 1.4–7.0)
PLATELETS: 255 10*3/uL (ref 150–379)
RBC: 3.97 x10E6/uL — ABNORMAL LOW (ref 4.14–5.80)
RDW: 14.8 % (ref 12.3–15.4)
WBC: 6.7 10*3/uL (ref 3.4–10.8)

## 2017-11-12 ENCOUNTER — Other Ambulatory Visit: Payer: Self-pay | Admitting: Family Medicine

## 2017-11-12 DIAGNOSIS — N138 Other obstructive and reflux uropathy: Secondary | ICD-10-CM

## 2017-11-12 DIAGNOSIS — N403 Nodular prostate with lower urinary tract symptoms: Principal | ICD-10-CM

## 2017-11-14 DIAGNOSIS — H401130 Primary open-angle glaucoma, bilateral, stage unspecified: Secondary | ICD-10-CM | POA: Diagnosis not present

## 2017-11-14 DIAGNOSIS — H35372 Puckering of macula, left eye: Secondary | ICD-10-CM | POA: Diagnosis not present

## 2017-11-14 DIAGNOSIS — H26493 Other secondary cataract, bilateral: Secondary | ICD-10-CM | POA: Diagnosis not present

## 2017-11-23 ENCOUNTER — Telehealth: Payer: Self-pay | Admitting: Family Medicine

## 2017-11-23 NOTE — Telephone Encounter (Signed)
Copied from [place] [id number]. Topic: Quick Communication - Rx Refill/Question >> Nov 23, 2017  3:18 PM Yvette Rack wrote: Has the patient contacted their pharmacy? yes   (Agent: If no, request that the patient contact the pharmacy for the refill.) Finasteride 5mg    Preferred Pharmacy (with phone number or street name): [place], [place]. [phone] (Phone) [phone] (Fax)     Agent: Please be advised that RX refills may take up to 3 business days. We ask that you follow-up with your pharmacy.

## 2017-11-23 NOTE — Telephone Encounter (Signed)
LOV 11/10/17 with Dr. Jeananne Rama / Medication refill for Finasteride / Not on medication list / Will route to Dr. Jeananne Rama

## 2017-11-23 NOTE — Telephone Encounter (Signed)
     Copied from [place] [id number]. Topic: Quick Communication - Rx Refill/Question >> Nov 23, 2017  3:18 PM Yvette Rack wrote: Has the patient contacted their pharmacy? yes   (Agent: If no, request that the patient contact the pharmacy for the refill.) Finasteride 5mg    Preferred Pharmacy (with phone number or street name): [place], [place]. [phone] (Phone) [phone] (Fax)     Agent: Please be advised that RX refills may take up to 3 business days. We ask that you follow-up with your pharmacy. >> Nov 23, 2017  3:21 PM Celedonio Savage L wrote: Finasteride 5mg  90 day supply see message

## 2017-11-30 MED ORDER — FINASTERIDE 5 MG PO TABS: 5.0000 mg | ORAL_TABLET | Freq: Every day | ORAL | 0 refills | Status: DC

## 2017-11-30 NOTE — Telephone Encounter (Signed)
Patient states Dr Rometta Emery prescribe in 2017. Please advise

## 2017-11-30 NOTE — Addendum Note (Signed)
Addended by: Merrie Roof E on: 11/30/2017 04:21 PM   Modules accepted: Orders

## 2017-11-30 NOTE — Telephone Encounter (Signed)
Sent in 1 month supply until MAC returns - will leave in his box for review and further refills if desired

## 2017-12-05 MED ORDER — FINASTERIDE 5 MG PO TABS: 5.0000 mg | ORAL_TABLET | Freq: Every day | ORAL | 2 refills | Status: DC

## 2017-12-05 NOTE — Addendum Note (Signed)
Addended by: Golden Pop A on: 12/05/2017 10:07 PM   Modules accepted: Orders

## 2017-12-13 ENCOUNTER — Ambulatory Visit (INDEPENDENT_AMBULATORY_CARE_PROVIDER_SITE_OTHER): Payer: Medicare Other | Admitting: Family Medicine

## 2017-12-13 ENCOUNTER — Encounter: Payer: Self-pay | Admitting: Family Medicine

## 2017-12-13 VITALS — BP 122/78 | HR 59 | Ht 64.0 in | Wt 160.0 lb

## 2017-12-13 DIAGNOSIS — I482 Chronic atrial fibrillation, unspecified: Secondary | ICD-10-CM

## 2017-12-13 DIAGNOSIS — I10 Essential (primary) hypertension: Secondary | ICD-10-CM | POA: Diagnosis not present

## 2017-12-13 DIAGNOSIS — M1A9XX0 Chronic gout, unspecified, without tophus (tophi): Secondary | ICD-10-CM

## 2017-12-13 NOTE — Assessment & Plan Note (Signed)
The current medical regimen is effective;  continue present plan and medications.  

## 2017-12-13 NOTE — Assessment & Plan Note (Signed)
The current medical regimen is effective;  continue present plan and medications. Follow-up PT/INR 1 month

## 2017-12-13 NOTE — Progress Notes (Signed)
BP 122/78   Pulse (!) 59   Ht 5\' 4"  (1.626 m)   Wt 160 lb (72.6 kg)   SpO2 99%   BMI 27.46 kg/m    Subjective:    [name], male    DOB: December 14, 1940, 77 y.o.   MRN: [id number]  HPI: Kirk Jones is a 77 y.o. male  Chief Complaint  Patient presents with  . Coagulation Disorder   Patient doing well taking medications without problems or issues no bleeding bruising or other concerns.  Last several visits patient's been right on the money at 2.6. Other conditions blood pressure good control no issues with medications. BPH all stable. Breathing stable. Recovered from hernia repair without problems Relevant past medical, surgical, family and social history reviewed and updated as indicated. Interim medical history since our last visit reviewed. Allergies and medications reviewed and updated.  Review of Systems  Constitutional: Negative.   Respiratory: Negative.   Cardiovascular: Negative.     Per HPI unless specifically indicated above     Objective:    BP 122/78   Pulse (!) 59   Ht 5\' 4"  (1.626 m)   Wt 160 lb (72.6 kg)   SpO2 99%   BMI 27.46 kg/m   Wt Readings from Last 3 Encounters:  12/13/17 160 lb (72.6 kg)  11/10/17 160 lb (72.6 kg)  10/27/17 161 lb (73 kg)    Physical Exam  Constitutional: He is oriented to person, place, and time. He appears well-developed and well-nourished.  HENT:  Head: Normocephalic and atraumatic.  Eyes: Conjunctivae and EOM are normal.  Neck: Normal range of motion.  Cardiovascular: Normal rate, regular rhythm and normal heart sounds.  Pulmonary/Chest: Effort normal and breath sounds normal.  Musculoskeletal: Normal range of motion.  Neurological: He is alert and oriented to person, place, and time.  Skin: No erythema.  Psychiatric: He has a normal mood and affect. His behavior is normal. Judgment and thought content normal.    Results for orders placed or performed in visit on 95/63/87  Basic  metabolic panel  Result Value Ref Range   Glucose 128 (H) 65 - 99 mg/dL   BUN 21 8 - 27 mg/dL   Creatinine, Ser 1.03 0.76 - 1.27 mg/dL   GFR calc non Af Amer 70 >59 mL/min/1.73   GFR calc Af Amer 81 >59 mL/min/1.73   BUN/Creatinine Ratio 20 10 - 24   Sodium 136 134 - 144 mmol/L   Potassium 4.3 3.5 - 5.2 mmol/L   Chloride 94 (L) 96 - 106 mmol/L   CO2 25 20 - 29 mmol/L   Calcium 9.5 8.6 - 10.2 mg/dL  CBC with Differential/Platelet  Result Value Ref Range   WBC 6.7 3.4 - 10.8 x10E3/uL   RBC 3.97 (L) 4.14 - 5.80 x10E6/uL   Hemoglobin 13.6 13.0 - 17.7 g/dL   Hematocrit 40.8 37.5 - 51.0 %   MCV 103 (H) 79 - 97 fL   MCH 34.3 (H) 26.6 - 33.0 pg   MCHC 33.3 31.5 - 35.7 g/dL   RDW 14.8 12.3 - 15.4 %   Platelets 255 150 - 379 x10E3/uL   Neutrophils 75 Not Estab. %   Lymphs 11 Not Estab. %   Monocytes 12 Not Estab. %   Eos 1 Not Estab. %   Basos 1 Not Estab. %   Neutrophils Absolute 5.1 1.4 - 7.0 x10E3/uL   Lymphocytes Absolute 0.8 0.7 - 3.1 x10E3/uL   Monocytes Absolute 0.8 0.1 -  0.9 x10E3/uL   EOS (ABSOLUTE) 0.1 0.0 - 0.4 x10E3/uL   Basophils Absolute 0.1 0.0 - 0.2 x10E3/uL   Immature Granulocytes 0 Not Estab. %   Immature Grans (Abs) 0.0 0.0 - 0.1 x10E3/uL  CoaguChek XS/INR Waived  Result Value Ref Range   INR 2.9 (H) 0.9 - 1.1   Prothrombin Time 34.4 sec      Assessment & Plan:   Problem List Items Addressed This Visit      Cardiovascular and Mediastinum   Atrial fibrillation ([place]) - Primary (Chronic)    The current medical regimen is effective;  continue present plan and medications. Follow-up PT/INR 1 month      Relevant Orders   CoaguChek XS/INR Waived   Hypertension    The current medical regimen is effective;  continue present plan and medications.         Other   Gout    The current medical regimen is effective;  continue present plan and medications.           Follow up plan: Return in about 4 weeks (around 01/10/2018) for PT INR.

## 2017-12-14 LAB — COAGUCHEK XS/INR WAIVED
INR: 2.6 — ABNORMAL HIGH (ref 0.9–1.1)
Prothrombin Time: 31.3 s

## 2018-01-05 DIAGNOSIS — H26493 Other secondary cataract, bilateral: Secondary | ICD-10-CM | POA: Diagnosis not present

## 2018-01-05 DIAGNOSIS — H26491 Other secondary cataract, right eye: Secondary | ICD-10-CM | POA: Diagnosis not present

## 2018-01-05 DIAGNOSIS — H35033 Hypertensive retinopathy, bilateral: Secondary | ICD-10-CM | POA: Diagnosis not present

## 2018-01-05 DIAGNOSIS — H5319 Other subjective visual disturbances: Secondary | ICD-10-CM | POA: Diagnosis not present

## 2018-01-05 DIAGNOSIS — Z961 Presence of intraocular lens: Secondary | ICD-10-CM | POA: Diagnosis not present

## 2018-01-05 DIAGNOSIS — H35373 Puckering of macula, bilateral: Secondary | ICD-10-CM | POA: Diagnosis not present

## 2018-01-10 ENCOUNTER — Encounter (INDEPENDENT_AMBULATORY_CARE_PROVIDER_SITE_OTHER): Payer: Medicare Other | Admitting: Ophthalmology

## 2018-01-10 ENCOUNTER — Ambulatory Visit: Payer: Medicare Other | Admitting: Family Medicine

## 2018-01-10 DIAGNOSIS — I10 Essential (primary) hypertension: Secondary | ICD-10-CM | POA: Diagnosis not present

## 2018-01-10 DIAGNOSIS — H35033 Hypertensive retinopathy, bilateral: Secondary | ICD-10-CM | POA: Diagnosis not present

## 2018-01-10 DIAGNOSIS — H43813 Vitreous degeneration, bilateral: Secondary | ICD-10-CM

## 2018-01-10 DIAGNOSIS — H26492 Other secondary cataract, left eye: Secondary | ICD-10-CM | POA: Diagnosis not present

## 2018-01-10 DIAGNOSIS — H35372 Puckering of macula, left eye: Secondary | ICD-10-CM

## 2018-01-11 ENCOUNTER — Ambulatory Visit (INDEPENDENT_AMBULATORY_CARE_PROVIDER_SITE_OTHER): Payer: Medicare Other | Admitting: Family Medicine

## 2018-01-11 ENCOUNTER — Encounter: Payer: Self-pay | Admitting: Family Medicine

## 2018-01-11 VITALS — BP 145/85 | HR 58 | Wt 160.0 lb

## 2018-01-11 DIAGNOSIS — I482 Chronic atrial fibrillation, unspecified: Secondary | ICD-10-CM

## 2018-01-11 DIAGNOSIS — I10 Essential (primary) hypertension: Secondary | ICD-10-CM | POA: Diagnosis not present

## 2018-01-11 LAB — COAGUCHEK XS/INR WAIVED
INR: 3.2 — AB (ref 0.9–1.1)
Prothrombin Time: 38.5 s

## 2018-01-11 NOTE — Assessment & Plan Note (Signed)
The current medical regimen is effective;  continue present plan and medications.  

## 2018-01-11 NOTE — Progress Notes (Signed)
   BP (!) 145/85   Pulse (!) 58   Wt 160 lb (72.6 kg)   SpO2 99%   BMI 27.46 kg/m    Subjective:    [name], male    DOB: 1941/09/01, 77 y.o.   MRN: [id number]  HPI: Kirk Jones is a 77 y.o. male  Chief Complaint  Patient presents with  . Atrial Fibrillation  Patient recheck INR with goal between 2.5 and 3.5.  Has been consistently good for the last 3+ months.  Taking 4 mg 1 day and 3-1/2 mg the next.  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 Patient's breathing has been doing okay using Spiriva little more frequently which seems to help with his breathing. Blood pressure also doing okay.'s up a little bit but otherwise doing well.  No issues with any other medicines taken faithfully.  Relevant past medical, surgical, family and social history reviewed and updated as indicated. Interim medical history since our last visit reviewed. Allergies and medications reviewed and updated.  Review of Systems  Constitutional: Negative.   Respiratory: Negative.   Cardiovascular: Negative.     Per HPI unless specifically indicated above     Objective:    BP (!) 145/85   Pulse (!) 58   Wt 160 lb (72.6 kg)   SpO2 99%   BMI 27.46 kg/m   Wt Readings from Last 3 Encounters:  01/11/18 160 lb (72.6 kg)  12/13/17 160 lb (72.6 kg)  11/10/17 160 lb (72.6 kg)    Physical Exam  Constitutional: He is oriented to person, place, and time. He appears well-developed and well-nourished.  HENT:  Head: Normocephalic and atraumatic.  Eyes: Conjunctivae and EOM are normal.  Neck: Normal range of motion.  Cardiovascular: Normal rate, regular rhythm and normal heart sounds.  Pulmonary/Chest: Effort normal and breath sounds normal.  Musculoskeletal: Normal range of motion.  Neurological: He is alert and oriented to person, place, and time.  Skin: No erythema.  Psychiatric: He has a normal mood and affect. His behavior is normal.  Judgment and thought content normal.    Results for orders placed or performed in visit on 12/13/17  CoaguChek XS/INR Waived  Result Value Ref Range   INR 2.6 (H) 0.9 - 1.1   Prothrombin Time 31.3 sec      Assessment & Plan:   Problem List Items Addressed This Visit      Cardiovascular and Mediastinum   Atrial fibrillation ([place]) - Primary (Chronic)    The current medical regimen is effective;  continue present plan and medications.       Relevant Orders   CoaguChek XS/INR Waived   Hypertension    The current medical regimen is effective;  continue present plan and medications.          Patient also having issues with his eyes working with eye doctors has had some laser treatment on both eyes recently.  But otherwise doing well.  Follow up plan: Return in about 4 weeks (around 02/08/2018) for PT INR.

## 2018-01-24 ENCOUNTER — Encounter: Payer: Self-pay | Admitting: Family Medicine

## 2018-02-01 ENCOUNTER — Encounter (INDEPENDENT_AMBULATORY_CARE_PROVIDER_SITE_OTHER): Payer: Medicare Other | Admitting: Ophthalmology

## 2018-02-01 ENCOUNTER — Other Ambulatory Visit: Payer: Self-pay | Admitting: Family Medicine

## 2018-02-01 DIAGNOSIS — H2702 Aphakia, left eye: Secondary | ICD-10-CM

## 2018-02-01 NOTE — Telephone Encounter (Signed)
LOV 01/11/18 with Dr. Jeananne Rama INR done on 01/11/18 / Refill request for Coumadin / Refilled per protocol

## 2018-02-07 ENCOUNTER — Ambulatory Visit: Payer: Medicare Other | Admitting: Family Medicine

## 2018-02-08 ENCOUNTER — Encounter (INDEPENDENT_AMBULATORY_CARE_PROVIDER_SITE_OTHER): Payer: Medicare Other | Admitting: Ophthalmology

## 2018-02-09 ENCOUNTER — Encounter: Payer: Self-pay | Admitting: Family Medicine

## 2018-02-09 ENCOUNTER — Ambulatory Visit (INDEPENDENT_AMBULATORY_CARE_PROVIDER_SITE_OTHER): Payer: Medicare Other | Admitting: Family Medicine

## 2018-02-09 VITALS — BP 152/80 | HR 85 | Temp 98.3°F | Wt 169.2 lb

## 2018-02-09 DIAGNOSIS — J309 Allergic rhinitis, unspecified: Secondary | ICD-10-CM | POA: Diagnosis not present

## 2018-02-09 DIAGNOSIS — R195 Other fecal abnormalities: Secondary | ICD-10-CM | POA: Diagnosis not present

## 2018-02-09 DIAGNOSIS — Z952 Presence of prosthetic heart valve: Secondary | ICD-10-CM | POA: Diagnosis not present

## 2018-02-09 DIAGNOSIS — I4891 Unspecified atrial fibrillation: Secondary | ICD-10-CM

## 2018-02-09 LAB — COAGUCHEK XS/INR WAIVED
INR: 2.3 — ABNORMAL HIGH (ref 0.9–1.1)
Prothrombin Time: 27.5 s

## 2018-02-09 NOTE — Progress Notes (Signed)
BP (!) 152/80   Pulse 85   Temp 98.3 F (36.8 C) (Oral)   Wt 169 lb 3.2 oz (76.7 kg)   SpO2 94%   BMI 29.04 kg/m    Subjective:    [name], male    DOB: 05/27/1941, 77 y.o.   MRN: [id number]  HPI: Dirk Vanaman is a 77 y.o. male  Chief Complaint  Patient presents with  . Atrial Fibrillation    pt alternating 4 mg and 3.5 mg of warfarin   Pt here today for 1 month INR check. Taking 4 mg and 3.5 mg alternating, INRs have been stable and at goal the past few months on this regimen. The past 2 weeks has been taking 4 mg 3 times per week, 3.5s 4 times per week as his arms have been bruising a bit more than usual. Denies CP, SOB, palpitations.   No other bleeding or bruising issues other than some slightly dark stools the past week. Taking 1 iron pill per week, usually notes only a few days of dark stools after the tablets. This past week stools stayed dark for a week. Has been checked in the past with stool testing to determine if this is blood and testing came out negative. Wants another take home kit to test this.   Also having several weeks of worsening rhinorrhea, sneezing, watering eyes. Known hx of allergies, with worst sxs in this season. Has been taking allegra daily. Denies fevers, chills, aches, facial pain, productive cough.   Past Medical History:  Diagnosis Date  . Aortic valve regurgitation   . Atrial fibrillation ([place])   . Barrett's esophagus   . COPD (chronic obstructive pulmonary disease) ([place])   . Diverticulosis   . Gout   . Headache    aura only  . Hematuria   . History of prosthetic heart valve   . Hyperlipidemia   . Hypertension   . Nodular prostate with urinary obstruction   . Substance abuse ([place])    alcohol   Social History   Socioeconomic History  . Marital status: Married    Spouse name: Not on file  . Number of children: Not on file  . Years of education: Not on file  . Highest education level: Not on file    Social Needs  . Financial resource strain: Not hard at all  . Food insecurity - worry: Never true  . Food insecurity - inability: Never true  . Transportation needs - medical: No  . Transportation needs - non-medical: No  Occupational History  . Not on file  Tobacco Use  . Smoking status: Former Smoker    Types: Cigarettes    Last attempt to quit: 04/24/1991    Years since quitting: 26.8  . Smokeless tobacco: Never Used  Substance and Sexual Activity  . Alcohol use: Yes    Alcohol/week: 14.4 oz    Types: 24 Shots of liquor per week    Comment: quit December 2015/restarted  . Drug use: No  . Sexual activity: No  Other Topics Concern  . Not on file  Social History Narrative  . Not on file   Relevant past medical, surgical, family and social history reviewed and updated as indicated. Interim medical history since our last visit reviewed. Allergies and medications reviewed and updated.  Review of Systems  Per HPI unless specifically indicated above     Objective:    BP (!) 152/80   Pulse 85   Temp  98.3 F (36.8 C) (Oral)   Wt 169 lb 3.2 oz (76.7 kg)   SpO2 94%   BMI 29.04 kg/m   Wt Readings from Last 3 Encounters:  02/09/18 169 lb 3.2 oz (76.7 kg)  01/11/18 160 lb (72.6 kg)  12/13/17 160 lb (72.6 kg)    Physical Exam  Constitutional: He is oriented to person, place, and time. He appears well-developed and well-nourished. No distress.  HENT:  Head: Atraumatic.  Right Ear: External ear normal.  Left Ear: External ear normal.  Mouth/Throat: Oropharynx is clear and moist. No oropharyngeal exudate.  Nasal mucosa boggy and injected, rhinorrhea present  Eyes: Pupils are equal, round, and reactive to light.  Conjunctiva mildly injected, watery drainage from b/l eyes  Neck: Normal range of motion. Neck supple.  Cardiovascular: Normal rate.  Pulmonary/Chest: Effort normal and breath sounds normal. No respiratory distress.  Musculoskeletal: Normal range of motion.   Neurological: He is alert and oriented to person, place, and time.  Skin: Skin is warm and dry.  Bruising of forearms  Psychiatric: He has a normal mood and affect. His behavior is normal.  Nursing note and vitals reviewed.  Results for orders placed or performed in visit on 02/09/18  CoaguChek XS/INR Waived  Result Value Ref Range   INR 2.3 (H) 0.9 - 1.1   Prothrombin Time 27.5 sec      Assessment & Plan:   Problem List Items Addressed This Visit      Cardiovascular and Mediastinum   Atrial fibrillation ([place]) - Primary (Chronic)   Relevant Orders   CoaguChek XS/INR Waived (Completed)     Respiratory   Allergic rhinitis    Increase allegra to BID, as well as add flonase BID - reviewed proper use. Pt had stopped due to septum irritation but was not pointing applicator in correctly which was likely the cause of bleeding. Sinus rinses, humidifier, vicks.         Other   History of prosthetic heart valve (Chronic)    INR slightly below goal today at 2.3, but will not change regimen as he's been stable on it for quite some time. Likely due to pt self-dose adjusting due to bruising. Does have a hx of doing these types of adjustments to his regimen. Recommended sticking with the regimen regardless, keeping diet stable.        Other Visit Diagnoses    Dark stools       Declined in house rectal exam w/ stool testing, office currently out of home kits. Will let him know as soon as restocked. Hold iron for now, monitor closely       Follow up plan: Return in about 4 weeks (around 03/09/2018) for INR.

## 2018-02-12 DIAGNOSIS — J309 Allergic rhinitis, unspecified: Secondary | ICD-10-CM | POA: Insufficient documentation

## 2018-02-12 NOTE — Patient Instructions (Signed)
Follow up in 1 month   

## 2018-02-12 NOTE — Assessment & Plan Note (Signed)
INR slightly below goal today at 2.3, but will not change regimen as he's been stable on it for quite some time. Likely due to pt self-dose adjusting due to bruising. Does have a hx of doing these types of adjustments to his regimen. Recommended sticking with the regimen regardless, keeping diet stable.

## 2018-02-12 NOTE — Assessment & Plan Note (Signed)
Increase allegra to BID, as well as add flonase BID - reviewed proper use. Pt had stopped due to septum irritation but was not pointing applicator in correctly which was likely the cause of bleeding. Sinus rinses, humidifier, vicks.

## 2018-02-21 DIAGNOSIS — I472 Ventricular tachycardia: Secondary | ICD-10-CM | POA: Diagnosis not present

## 2018-02-21 DIAGNOSIS — I5022 Chronic systolic (congestive) heart failure: Secondary | ICD-10-CM | POA: Diagnosis not present

## 2018-02-21 DIAGNOSIS — Z9861 Coronary angioplasty status: Secondary | ICD-10-CM | POA: Diagnosis not present

## 2018-02-21 DIAGNOSIS — I6300 Cerebral infarction due to thrombosis of unspecified precerebral artery: Secondary | ICD-10-CM | POA: Diagnosis not present

## 2018-02-21 DIAGNOSIS — J449 Chronic obstructive pulmonary disease, unspecified: Secondary | ICD-10-CM | POA: Diagnosis not present

## 2018-02-21 DIAGNOSIS — I2510 Atherosclerotic heart disease of native coronary artery without angina pectoris: Secondary | ICD-10-CM | POA: Diagnosis not present

## 2018-02-21 DIAGNOSIS — I10 Essential (primary) hypertension: Secondary | ICD-10-CM | POA: Diagnosis not present

## 2018-02-21 DIAGNOSIS — I480 Paroxysmal atrial fibrillation: Secondary | ICD-10-CM | POA: Diagnosis not present

## 2018-02-21 DIAGNOSIS — Z7901 Long term (current) use of anticoagulants: Secondary | ICD-10-CM | POA: Diagnosis not present

## 2018-02-21 DIAGNOSIS — Z952 Presence of prosthetic heart valve: Secondary | ICD-10-CM | POA: Diagnosis not present

## 2018-02-21 DIAGNOSIS — Z95810 Presence of automatic (implantable) cardiac defibrillator: Secondary | ICD-10-CM | POA: Diagnosis not present

## 2018-02-21 DIAGNOSIS — I5032 Chronic diastolic (congestive) heart failure: Secondary | ICD-10-CM | POA: Diagnosis not present

## 2018-03-02 ENCOUNTER — Telehealth: Payer: Self-pay | Admitting: Family Medicine

## 2018-03-02 NOTE — Telephone Encounter (Signed)
Copied from [place]. Topic: Quick Communication - Rx Refill/Question >> Mar 02, 2018  3:27 PM Robina Ade, Helene Kelp D wrote: Medication: febuxostat (ULORIC) 40 MG tablet Has the patient contacted their pharmacy?Yes (Agent: If no, request that the patient contact the pharmacy for the refill.) Preferred Pharmacy (with phone number or street name): [facility], [place]. Agent: Please be advised that RX refills may take up to 3 business days. We ask that you follow-up with your pharmacy.  Patient would like to talk to [facility] CMA about why his medication was denied. Please call patient back, thanks.

## 2018-03-03 NOTE — Telephone Encounter (Signed)
Waiting for fax to see why medication was denied.

## 2018-03-03 NOTE — Telephone Encounter (Signed)
Spoke with patient. I have not received fax as to why medication was denied or number to do an appeal yet.   Asked patient if we could send in an alternative and he stated colchicine was too expensive. Explained to patient we haven't received the fax as to why, I would call his insurance company for a possible appeal. Patient said he didn't need the uloric every day but he only has 1 pill left but patient was okay with plan.   Routing to provider as Juluis Rainier.

## 2018-03-06 NOTE — Telephone Encounter (Signed)
Let me know if there's anything I can do to help appeal his denial

## 2018-03-06 NOTE — Telephone Encounter (Signed)
Still no fax from pharmacy.  Going to redo a Prior Authorization.

## 2018-03-07 ENCOUNTER — Telehealth: Payer: Self-pay | Admitting: Family Medicine

## 2018-03-07 NOTE — Telephone Encounter (Signed)
PA submitted. Key: Atlee Abide

## 2018-03-07 NOTE — Telephone Encounter (Signed)
Copied from [place] [id number] [phone]. Topic: Quick Communication - See Telephone Encounter >> Mar 07, 2018  8:57 AM Arletha Grippe wrote: CRM for notification. See Telephone encounter for: 03/07/18. [employer] - uloric 40 mg has been approved for 1 year from today.  Cb is [phone] option 5

## 2018-03-07 NOTE — Telephone Encounter (Signed)
Patient notified

## 2018-03-07 NOTE — Telephone Encounter (Signed)
Medication approved. Patient notified.

## 2018-03-09 ENCOUNTER — Encounter: Payer: Self-pay | Admitting: Family Medicine

## 2018-03-09 ENCOUNTER — Ambulatory Visit (INDEPENDENT_AMBULATORY_CARE_PROVIDER_SITE_OTHER): Payer: Medicare Other | Admitting: Family Medicine

## 2018-03-09 VITALS — BP 125/74 | HR 80 | Temp 97.9°F | Ht 64.0 in | Wt 170.5 lb

## 2018-03-09 DIAGNOSIS — M79605 Pain in left leg: Secondary | ICD-10-CM | POA: Diagnosis not present

## 2018-03-09 DIAGNOSIS — I4891 Unspecified atrial fibrillation: Secondary | ICD-10-CM | POA: Diagnosis not present

## 2018-03-09 DIAGNOSIS — Z952 Presence of prosthetic heart valve: Secondary | ICD-10-CM | POA: Diagnosis not present

## 2018-03-09 DIAGNOSIS — R600 Localized edema: Secondary | ICD-10-CM

## 2018-03-09 LAB — COAGUCHEK XS/INR WAIVED
INR: 5.1 — AB (ref 0.9–1.1)
PROTHROMBIN TIME: 61.7 s

## 2018-03-09 NOTE — Patient Instructions (Signed)
Hold dose tonight, 3.5 mg daily until recheck in 2 weeks

## 2018-03-09 NOTE — Assessment & Plan Note (Signed)
Hold coumadin dose tonight, start 3.5 mg daily and recheck in 2 weeks

## 2018-03-09 NOTE — Progress Notes (Signed)
BP 125/74   Pulse 80   Temp 97.9 F (36.6 C) (Oral)   Ht 5\' 4"  (1.626 m)   Wt 170 lb 8 oz (77.3 kg)   SpO2 94%   BMI 29.27 kg/m    Subjective:    [name], male    DOB: 1941-07-19, 77 y.o.   MRN: [id number]  HPI: Kirk Jones is a 77 y.o. male  Chief Complaint  Patient presents with  . Atrial Fibrillation    PT/ INR- pt alternating 3.5 mg and 4 mg of warfarin    Pt here today for 1 month INR f/u. Alternates 3.5 and 4 mg coumadin. No bleeding or bruising issues, diet stable. Denies CP, palpitations, SOB. Only medication change is he's doubled his lasix to 80 mg as his weight was up 8 lb from fluid retention, directed by Cardiology.  Increased swelling x 1 month b/l LEs, with pain in left lower extremity diffusely along b/l sides of leg. Has had this issue several times in the past from his LE edema, typically resolves with increasing lasix. Some improvement slowly noted since increase. Denies focal tenderness, redness, chest discomfort, SOB.   Past Medical History:  Diagnosis Date  . Aortic valve regurgitation   . Atrial fibrillation ([place])   . Barrett's esophagus   . COPD (chronic obstructive pulmonary disease) ([place])   . Diverticulosis   . Gout   . Headache    aura only  . Hematuria   . History of prosthetic heart valve   . Hyperlipidemia   . Hypertension   . Nodular prostate with urinary obstruction   . Substance abuse ([place])    alcohol   Social History   Socioeconomic History  . Marital status: Married    Spouse name: Not on file  . Number of children: Not on file  . Years of education: Not on file  . Highest education level: Not on file  Occupational History  . Not on file  Social Needs  . Financial resource strain: Not hard at all  . Food insecurity:    Worry: Never true    Inability: Never true  . Transportation needs:    Medical: No    Non-medical: No  Tobacco Use  . Smoking status: Former Smoker    Types: Cigarettes      Last attempt to quit: 04/24/1991    Years since quitting: 26.9  . Smokeless tobacco: Never Used  Substance and Sexual Activity  . Alcohol use: Yes    Alcohol/week: 14.4 oz    Types: 24 Shots of liquor per week    Comment: quit December 2015/restarted  . Drug use: No  . Sexual activity: Never  Lifestyle  . Physical activity:    Days per week: 0 days    Minutes per session: 0 min  . Stress: Not at all  Relationships  . Social connections:    Talks on phone: Once a week    Gets together: Once a week    Attends religious service: More than 4 times per year    Active member of club or organization: No    Attends meetings of clubs or organizations: Never    Relationship status: Married  . Intimate partner violence:    Fear of current or ex partner: No    Emotionally abused: No    Physically abused: No    Forced sexual activity: No  Other Topics Concern  . Not on file  Social History Narrative  .  Not on file   Relevant past medical, surgical, family and social history reviewed and updated as indicated. Interim medical history since our last visit reviewed. Allergies and medications reviewed and updated.  Review of Systems  Per HPI unless specifically indicated above     Objective:    BP 125/74   Pulse 80   Temp 97.9 F (36.6 C) (Oral)   Ht 5\' 4"  (1.626 m)   Wt 170 lb 8 oz (77.3 kg)   SpO2 94%   BMI 29.27 kg/m   Wt Readings from Last 3 Encounters:  03/09/18 170 lb 8 oz (77.3 kg)  02/09/18 169 lb 3.2 oz (76.7 kg)  01/11/18 160 lb (72.6 kg)    Physical Exam  Constitutional: He is oriented to person, place, and time. He appears well-developed and well-nourished. No distress.  HENT:  Head: Atraumatic.  Eyes: Pupils are equal, round, and reactive to light. EOM are normal.  Neck: Normal range of motion. Neck supple.  Cardiovascular: Normal rate.  Pulmonary/Chest: Effort normal and breath sounds normal. No respiratory distress.  Musculoskeletal: Normal range of  motion. He exhibits edema (B/l LEs, slightly worse in the left) and tenderness (diffuse ttp distal LLE).  No focal ttp on exam, tenderness appears to b mostly the lateral and medial leg and not posterior or behind knee - homans sign  Neurological: He is alert and oriented to person, place, and time.  Skin: Skin is warm and dry. There is erythema (Diffusely across distal LLE).  Nursing note and vitals reviewed.   Results for orders placed or performed in visit on 03/09/18  CoaguChek XS/INR Waived  Result Value Ref Range   INR 5.1 (HH) 0.9 - 1.1   Prothrombin Time 61.7 sec      Assessment & Plan:   Problem List Items Addressed This Visit      Cardiovascular and Mediastinum   Atrial fibrillation ([place]) - Primary (Chronic)    INR above goal today at 5.1, will hold tonight's dose and then start tomorrow with 3.5 mg daily. Recheck in 2 weeks. F/u in meantime if any new sxs or issues arise      Relevant Orders   CoaguChek XS/INR Waived (Completed)     Other   History of prosthetic heart valve (Chronic)    Hold coumadin dose tonight, start 3.5 mg daily and recheck in 2 weeks       Other Visit Diagnoses    Bilateral leg edema       Continue increased lasix dose per Cardiology recommendation. Compression stockings, elevation, reduce sodium intake. Monitor closely   Left leg pain       Suspect from edema as this has happened in the past, pt declines DVT r/o U/S at this time and wishes to continue to monitor. F/u if worsening or no better       Follow up plan: Return in about 2 weeks (around 03/23/2018) for INR.

## 2018-03-12 NOTE — Assessment & Plan Note (Signed)
INR above goal today at 5.1, will hold tonight's dose and then start tomorrow with 3.5 mg daily. Recheck in 2 weeks. F/u in meantime if any new sxs or issues arise

## 2018-03-23 ENCOUNTER — Encounter: Payer: Self-pay | Admitting: Family Medicine

## 2018-03-23 ENCOUNTER — Ambulatory Visit (INDEPENDENT_AMBULATORY_CARE_PROVIDER_SITE_OTHER): Payer: Medicare Other | Admitting: Family Medicine

## 2018-03-23 VITALS — BP 119/74 | HR 89 | Temp 98.4°F | Wt 165.6 lb

## 2018-03-23 DIAGNOSIS — R600 Localized edema: Secondary | ICD-10-CM

## 2018-03-23 DIAGNOSIS — I4891 Unspecified atrial fibrillation: Secondary | ICD-10-CM | POA: Diagnosis not present

## 2018-03-23 DIAGNOSIS — L309 Dermatitis, unspecified: Secondary | ICD-10-CM

## 2018-03-23 DIAGNOSIS — Z952 Presence of prosthetic heart valve: Secondary | ICD-10-CM

## 2018-03-23 LAB — COAGUCHEK XS/INR WAIVED
INR: 2.2 — ABNORMAL HIGH (ref 0.9–1.1)
Prothrombin Time: 26 s

## 2018-03-23 MED ORDER — DESONIDE 0.05 % EX LOTN: 1.0000 "application " | TOPICAL_LOTION | Freq: Every day | CUTANEOUS | 3 refills | Status: DC

## 2018-03-23 NOTE — Progress Notes (Signed)
BP 119/74 (BP Location: Right Arm, Patient Position: Sitting, Cuff Size: Normal)   Pulse 89   Temp 98.4 F (36.9 C) (Oral)   Wt 165 lb 9.6 oz (75.1 kg)   SpO2 96%   BMI 28.43 kg/m    Subjective:    [name], male    DOB: Jul 22, 1941, 77 y.o.   MRN: [id number]  HPI: Kirk Jones is a 77 y.o. male  Chief Complaint  Patient presents with  . Coagulation Disorder    PT/INR  . Edema    Patient states he's still having some sweeling  . Medication Management    Wants to know if he can be prescribed desonide lotion that his Dermatologist originally prescribed that worked.   Pt here today for 2 week INR recheck. Last INR was 5.1. Held one dose, then decreased to 3.5 mg daily. States his bruising and nosebleeds improved with change. Denies Cp, palpitations, SOB.   Itching and scaly skin on face, desonide lotion has worked well in the past for him. Has seen dermatologist in the past for this.   Still having some LE edema. Has been using compression on occasion, elevating legs. LLE tenderness and redness slowly improving. States he doubles his lasix to 80 mg occasionally.   Past Medical History:  Diagnosis Date  . Aortic valve regurgitation   . Atrial fibrillation ([place])   . Barrett's esophagus   . COPD (chronic obstructive pulmonary disease) ([place])   . Diverticulosis   . Gout   . Headache    aura only  . Hematuria   . History of prosthetic heart valve   . Hyperlipidemia   . Hypertension   . Nodular prostate with urinary obstruction   . Substance abuse ([place])    alcohol   Social History   Socioeconomic History  . Marital status: Married    Spouse name: Not on file  . Number of children: Not on file  . Years of education: Not on file  . Highest education level: Not on file  Occupational History  . Not on file  Social Needs  . Financial resource strain: Not hard at all  . Food insecurity:    Worry: Never true    Inability: Never true  .  Transportation needs:    Medical: No    Non-medical: No  Tobacco Use  . Smoking status: Former Smoker    Types: Cigarettes    Last attempt to quit: 04/24/1991    Years since quitting: 26.9  . Smokeless tobacco: Never Used  Substance and Sexual Activity  . Alcohol use: Yes    Alcohol/week: 14.4 oz    Types: 24 Shots of liquor per week    Comment: quit December 2015/restarted  . Drug use: No  . Sexual activity: Never  Lifestyle  . Physical activity:    Days per week: 0 days    Minutes per session: 0 min  . Stress: Not at all  Relationships  . Social connections:    Talks on phone: Once a week    Gets together: Once a week    Attends religious service: More than 4 times per year    Active member of club or organization: No    Attends meetings of clubs or organizations: Never    Relationship status: Married  . Intimate partner violence:    Fear of current or ex partner: No    Emotionally abused: No    Physically abused: No  Forced sexual activity: No  Other Topics Concern  . Not on file  Social History Narrative  . Not on file   Relevant past medical, surgical, family and social history reviewed and updated as indicated. Interim medical history since our last visit reviewed. Allergies and medications reviewed and updated.  Review of Systems  Per HPI unless specifically indicated above     Objective:    BP 119/74 (BP Location: Right Arm, Patient Position: Sitting, Cuff Size: Normal)   Pulse 89   Temp 98.4 F (36.9 C) (Oral)   Wt 165 lb 9.6 oz (75.1 kg)   SpO2 96%   BMI 28.43 kg/m   Wt Readings from Last 3 Encounters:  03/23/18 165 lb 9.6 oz (75.1 kg)  03/09/18 170 lb 8 oz (77.3 kg)  02/09/18 169 lb 3.2 oz (76.7 kg)    Physical Exam  Constitutional: He is oriented to person, place, and time. He appears well-developed and well-nourished.  HENT:  Head: Atraumatic.  Eyes: Pupils are equal, round, and reactive to light. Conjunctivae are normal.  Neck: Normal  range of motion. Neck supple.  Cardiovascular: Normal rate.  Pulmonary/Chest: Effort normal and breath sounds normal.  Musculoskeletal: Normal range of motion. He exhibits edema (trace edema b/l LEs) and tenderness (mild ttp diffusely left lower leg).  Neurological: He is alert and oriented to person, place, and time.  Skin: Skin is warm and dry.  Psychiatric: He has a normal mood and affect. His behavior is normal.  Nursing note and vitals reviewed.   Results for orders placed or performed in visit on 03/23/18  CoaguChek XS/INR Waived  Result Value Ref Range   INR 2.2 (H) 0.9 - 1.1   Prothrombin Time 26.0 sec  Basic Metabolic Panel (BMET)  Result Value Ref Range   Glucose 95 65 - 99 mg/dL   BUN 26 8 - 27 mg/dL   Creatinine, Ser 1.24 0.76 - 1.27 mg/dL   GFR calc non Af Amer 56 (L) >59 mL/min/1.73   GFR calc Af Amer 64 >59 mL/min/1.73   BUN/Creatinine Ratio 21 10 - 24   Sodium 133 (L) 134 - 144 mmol/L   Potassium 4.4 3.5 - 5.2 mmol/L   Chloride 89 (L) 96 - 106 mmol/L   CO2 25 20 - 29 mmol/L   Calcium 9.4 8.6 - 10.2 mg/dL      Assessment & Plan:   Problem List Items Addressed This Visit      Cardiovascular and Mediastinum   Atrial fibrillation ([facility]) - Primary (Chronic)   Relevant Orders   CoaguChek XS/INR Waived (Completed)     Other   History of prosthetic heart valve (Chronic)    INR improved but slightly below goal today at 2.2. Will start 3.5 mg coumadin 4 days a week and 4 mg coumadin 3 days weekly. Recheck in 1 month.        Other Visit Diagnoses    Bilateral leg edema       On 40-80 mg coumadin daily. Will recheck renal function and electrolytes today to ensure safety. Push fluids, compression, elevation.    Relevant Orders   Basic Metabolic Panel (BMET) (Completed)   Dermatitis       Desonide lotion refilled, moisturize with good quality cream BID.        Follow up plan: Return in about 1 month (around 04/20/2018) for INR.

## 2018-03-23 NOTE — Patient Instructions (Addendum)
3.5 mg 4 times weekly, 4 mg 3 times weekly

## 2018-03-24 LAB — BASIC METABOLIC PANEL
BUN / CREAT RATIO: 21 (ref 10–24)
BUN: 26 mg/dL (ref 8–27)
CO2: 25 mmol/L (ref 20–29)
CREATININE: 1.24 mg/dL (ref 0.76–1.27)
Calcium: 9.4 mg/dL (ref 8.6–10.2)
Chloride: 89 mmol/L — ABNORMAL LOW (ref 96–106)
GFR calc Af Amer: 64 mL/min/{1.73_m2} (ref >59)
GFR calc non Af Amer: 56 mL/min/{1.73_m2} — ABNORMAL LOW (ref >59)
GLUCOSE: 95 mg/dL (ref 65–99)
Potassium: 4.4 mmol/L (ref 3.5–5.2)
SODIUM: 133 mmol/L — AB (ref 134–144)

## 2018-03-26 NOTE — Assessment & Plan Note (Signed)
INR improved but slightly below goal today at 2.2. Will start 3.5 mg coumadin 4 days a week and 4 mg coumadin 3 days weekly. Recheck in 1 month.

## 2018-03-29 ENCOUNTER — Telehealth: Payer: Self-pay | Admitting: Family Medicine

## 2018-03-29 NOTE — Telephone Encounter (Signed)
Copied from [place] [id number]. Topic: Inquiry >> Mar 28, 2018  9:21 AM Pricilla Handler wrote: Reason for CRM: Norberto Sorenson with [facility] [phone], Option 5) called requesting medical information for patient's prescriptions. Norberto Sorenson would like to speak with a nurse. Please call Norberto Sorenson at [place], Option 5.       Thank You!!!    Becky with [facility] [employer] called on 03/29/18 to speak with a CMA as well for information on the desonide (DESOWEN) 0.05 % lotion  PA

## 2018-03-30 MED ORDER — DESONIDE 0.05 % EX CREA: TOPICAL_CREAM | Freq: Two times a day (BID) | CUTANEOUS | 0 refills | Status: DC

## 2018-03-30 NOTE — Telephone Encounter (Signed)
PA has been denied per [facility], has not had 2 failed alternatives meds, hydrocortinsone 2.5 lotion or desonide cream. For appeal please call, [phone] Opt 5

## 2018-03-30 NOTE — Telephone Encounter (Signed)
Called and spoke to [facility] about a tier exception. It was completed and sent for provider review. Spoke with Porshia C 03/30/18 @ 1130

## 2018-03-30 NOTE — Addendum Note (Signed)
Addended by: Merrie Roof E on: 03/30/2018 04:48 PM   Modules accepted: Orders

## 2018-03-30 NOTE — Telephone Encounter (Signed)
Will try to get the desonide cream covered instead - he can blend it with some moisturizer to help with ease of application if this formulation is covered

## 2018-03-30 NOTE — Telephone Encounter (Signed)
Please see message and advise 

## 2018-04-11 DIAGNOSIS — I482 Chronic atrial fibrillation: Secondary | ICD-10-CM | POA: Diagnosis not present

## 2018-04-17 ENCOUNTER — Encounter: Payer: Self-pay | Admitting: Family Medicine

## 2018-04-17 ENCOUNTER — Ambulatory Visit (INDEPENDENT_AMBULATORY_CARE_PROVIDER_SITE_OTHER): Payer: Medicare Other | Admitting: Family Medicine

## 2018-04-17 VITALS — BP 139/70 | HR 99 | Temp 98.0°F | Wt 166.9 lb

## 2018-04-17 DIAGNOSIS — I482 Chronic atrial fibrillation, unspecified: Secondary | ICD-10-CM

## 2018-04-17 DIAGNOSIS — Z952 Presence of prosthetic heart valve: Secondary | ICD-10-CM

## 2018-04-17 DIAGNOSIS — R609 Edema, unspecified: Secondary | ICD-10-CM | POA: Diagnosis not present

## 2018-04-17 DIAGNOSIS — I5022 Chronic systolic (congestive) heart failure: Secondary | ICD-10-CM | POA: Diagnosis not present

## 2018-04-17 DIAGNOSIS — D692 Other nonthrombocytopenic purpura: Secondary | ICD-10-CM | POA: Diagnosis not present

## 2018-04-17 LAB — COAGUCHEK XS/INR WAIVED
INR: 4.8 — AB (ref 0.9–1.1)
PROTHROMBIN TIME: 57.2 s

## 2018-04-17 NOTE — Assessment & Plan Note (Signed)
Continues with swelling. Lungs clear. Checking BNP today. Await results.

## 2018-04-17 NOTE — Assessment & Plan Note (Signed)
Supratherapeutic INR- will cut down to 3.5mg  daily and recheck 2 weeks.

## 2018-04-17 NOTE — Assessment & Plan Note (Signed)
INR up- will get back down to therapeutic level.

## 2018-04-17 NOTE — Progress Notes (Signed)
BP 139/70 (BP Location: Left Arm, Patient Position: Sitting, Cuff Size: Normal)   Pulse 99   Temp 98 F (36.7 C) (Oral)   Wt 166 lb 14.4 oz (75.7 kg)   SpO2 94%   BMI 28.65 kg/m    Subjective:    [name], male    DOB: 12-02-1940, 77 y.o.   MRN: [id number]  HPI: Kirk Jones is a 77 y.o. male  Chief Complaint  Patient presents with  . Anticoagulation   Coumadin Management.  The expected duration of coumadin treatment is lifelong The reason for anticoagulation is  mechanical heart valve.  Present Coumadin dose: 4, 3.5 alternating Goal: 2.5-3.5  Excessive bruising: yes Nose bleeding: no Rectal bleeding: no Prolonged menstrual cycles: N/A Eating diet with consistent amounts of foods containing Vitamin K:no- has cut down Any recent antibiotic use? no  Has been having a lot more swelling. Has been taking more of his lasix- has recently been able to cut down to taking them every other day since Thursday. He has been wearing his compression socks. Has been having some joint pain in the L foot and the L knee and hip and his back. Has been taking a bit more ibuprofen.   Relevant past medical, surgical, family and social history reviewed and updated as indicated. Interim medical history since our last visit reviewed. Allergies and medications reviewed and updated.  Review of Systems  Constitutional: Negative.   HENT: Negative.   Respiratory: Positive for shortness of breath. Negative for apnea, cough, choking, chest tightness, wheezing and stridor.   Cardiovascular: Positive for leg swelling. Negative for chest pain.  Gastrointestinal: Negative.   Skin: Negative.   Neurological: Negative.   Hematological: Negative for adenopathy. Bruises/bleeds easily.  Psychiatric/Behavioral: Negative.     Per HPI unless specifically indicated above     Objective:    BP 139/70 (BP Location: Left Arm, Patient Position: Sitting, Cuff Size: Normal)   Pulse 99    Temp 98 F (36.7 C) (Oral)   Wt 166 lb 14.4 oz (75.7 kg)   SpO2 94%   BMI 28.65 kg/m   Wt Readings from Last 3 Encounters:  04/17/18 166 lb 14.4 oz (75.7 kg)  03/23/18 165 lb 9.6 oz (75.1 kg)  03/09/18 170 lb 8 oz (77.3 kg)    Physical Exam  Constitutional: He is oriented to person, place, and time. He appears well-developed and well-nourished. No distress.  HENT:  Head: Normocephalic and atraumatic.  Right Ear: Hearing normal.  Left Ear: Hearing normal.  Nose: Nose normal.  Eyes: Conjunctivae and lids are normal. Right eye exhibits no discharge. Left eye exhibits no discharge. No scleral icterus.  Cardiovascular: An irregularly irregular rhythm present. Exam reveals no gallop and no friction rub.  No murmur heard. Pulmonary/Chest: Effort normal and breath sounds normal. No stridor. No respiratory distress. He has no wheezes. He has no rales. He exhibits no tenderness.  Musculoskeletal: Normal range of motion.  Neurological: He is alert and oriented to person, place, and time.  Skin: Skin is warm, dry and intact. Capillary refill takes less than 2 seconds. No rash noted. He is not diaphoretic. No erythema. No pallor.  Psychiatric: He has a normal mood and affect. His speech is normal and behavior is normal. Judgment and thought content normal. Cognition and memory are normal.  Nursing note and vitals reviewed.   Results for orders placed or performed in visit on 03/23/18  CoaguChek XS/INR Waived  Result Value Ref  Range   INR 2.2 (H) 0.9 - 1.1   Prothrombin Time 26.0 sec  Basic Metabolic Panel (BMET)  Result Value Ref Range   Glucose 95 65 - 99 mg/dL   BUN 26 8 - 27 mg/dL   Creatinine, Ser 1.24 0.76 - 1.27 mg/dL   GFR calc non Af Amer 56 (L) >59 mL/min/1.73   GFR calc Af Amer 64 >59 mL/min/1.73   BUN/Creatinine Ratio 21 10 - 24   Sodium 133 (L) 134 - 144 mmol/L   Potassium 4.4 3.5 - 5.2 mmol/L   Chloride 89 (L) 96 - 106 mmol/L   CO2 25 20 - 29 mmol/L   Calcium 9.4 8.6 -  10.2 mg/dL      Assessment & Plan:   Problem List Items Addressed This Visit      Cardiovascular and Mediastinum   Atrial fibrillation (HCC) - Primary (Chronic)    Currently in a. Fib, rate a little fast. Will see how he's doing in 2 weeks, may need metoprolol increased.       Relevant Orders   CoaguChek XS/INR Waived   Senile purpura ([place])    INR up- will get back down to therapeutic level.      Systolic congestive heart failure ([place])    Continues with swelling. Lungs clear. Checking BNP today. Await results.       Relevant Orders   B Nat Peptide     Other   History of prosthetic heart valve (Chronic)    Supratherapeutic INR- will cut down to 3.5mg  daily and recheck 2 weeks.       Relevant Orders   CoaguChek XS/INR Waived    Other Visit Diagnoses    Peripheral edema       Elevate feet, Compression stockings. Continue lasix. Recheck 2 weeks.    Relevant Orders   Basic metabolic panel   B Nat Peptide       Follow up plan: Return in about 2 weeks (around 05/01/2018) for recheck INR and edema.

## 2018-04-17 NOTE — Assessment & Plan Note (Signed)
Currently in a. Fib, rate a little fast. Will see how he's doing in 2 weeks, may need metoprolol increased.

## 2018-04-18 ENCOUNTER — Ambulatory Visit: Payer: Medicare Other | Admitting: Family Medicine

## 2018-04-18 ENCOUNTER — Telehealth: Payer: Self-pay | Admitting: Family Medicine

## 2018-04-18 LAB — BASIC METABOLIC PANEL
BUN/Creatinine Ratio: 22 (ref 10–24)
BUN: 24 mg/dL (ref 8–27)
CO2: 22 mmol/L (ref 20–29)
CREATININE: 1.09 mg/dL (ref 0.76–1.27)
Calcium: 9.6 mg/dL (ref 8.6–10.2)
Chloride: 94 mmol/L — ABNORMAL LOW (ref 96–106)
GFR calc Af Amer: 75 mL/min/{1.73_m2} (ref >59)
GFR calc non Af Amer: 65 mL/min/{1.73_m2} (ref >59)
GLUCOSE: 158 mg/dL — AB (ref 65–99)
Potassium: 4.5 mmol/L (ref 3.5–5.2)
Sodium: 135 mmol/L (ref 134–144)

## 2018-04-18 LAB — BRAIN NATRIURETIC PEPTIDE: BNP: 1882.8 pg/mL — AB (ref 0.0–100.0)

## 2018-04-18 NOTE — Telephone Encounter (Signed)
Patient wondering about the BNAT lab. It was released on mychart and patient stated it came back very high with range being 100. Patient just wondering what the lab meant. Patient also wanted to know about his glucose and does this mean he needs to start eating more dark green vegetables.  Explained to patient I'd call him back as soon as I could after Dr. Wynetta Emery advises.

## 2018-04-18 NOTE — Telephone Encounter (Signed)
Please let him know that he is in a CHF flare. His kidneys are OK right now, so i'd like him to go back to taking the lasix every day until I see him.

## 2018-04-18 NOTE — Telephone Encounter (Signed)
Patient called back and said that he would like to talk to Dr. Wynetta Emery or his CMA about his lab results. He has questions on a couple of the results.

## 2018-04-18 NOTE — Telephone Encounter (Signed)
That's the marker that says he's in a CHF flare. His glucose was ok because he had eaten. He just needs to eat a consistent amount of green leafies because of his coumadin. Thanks!

## 2018-04-18 NOTE — Telephone Encounter (Signed)
Patient notified

## 2018-04-20 ENCOUNTER — Ambulatory Visit: Payer: Medicare Other | Admitting: Family Medicine

## 2018-05-02 ENCOUNTER — Ambulatory Visit (INDEPENDENT_AMBULATORY_CARE_PROVIDER_SITE_OTHER): Payer: Medicare Other | Admitting: Family Medicine

## 2018-05-02 ENCOUNTER — Encounter: Payer: Self-pay | Admitting: Family Medicine

## 2018-05-02 VITALS — BP 158/67 | HR 97 | Temp 98.4°F | Wt 164.1 lb

## 2018-05-02 DIAGNOSIS — R609 Edema, unspecified: Secondary | ICD-10-CM | POA: Diagnosis not present

## 2018-05-02 DIAGNOSIS — J441 Chronic obstructive pulmonary disease with (acute) exacerbation: Secondary | ICD-10-CM

## 2018-05-02 DIAGNOSIS — I482 Chronic atrial fibrillation, unspecified: Secondary | ICD-10-CM

## 2018-05-02 DIAGNOSIS — Z952 Presence of prosthetic heart valve: Secondary | ICD-10-CM | POA: Diagnosis not present

## 2018-05-02 LAB — COAGUCHEK XS/INR WAIVED
INR: 3.3 — ABNORMAL HIGH (ref 0.9–1.1)
Prothrombin Time: 39.1 s

## 2018-05-02 MED ORDER — AZITHROMYCIN 250 MG PO TABS: ORAL_TABLET | ORAL | 0 refills | Status: DC

## 2018-05-02 MED ORDER — PREDNISONE 10 MG PO TABS: ORAL_TABLET | ORAL | 0 refills | Status: DC

## 2018-05-02 NOTE — Progress Notes (Signed)
BP (!) 158/67 (BP Location: Left Arm, Patient Position: Sitting, Cuff Size: Normal)   Pulse 97   Temp 98.4 F (36.9 C)   Wt 164 lb 2 oz (74.4 kg)   SpO2 96%   BMI 28.17 kg/m    Subjective:    [name], male    DOB: Feb 26, 1941, 77 y.o.   MRN: [id number]  HPI: Kirk Jones is a 77 y.o. male  Chief Complaint  Patient presents with  . PTINR  . Edema   Coumadin Management.  The expected duration of coumadin treatment is lifelong The reason for anticoagulation is  mechanical heart valve.  Present Coumadin dose: 3.5 daily Goal: 2.5-3.5  Excessive bruising: no Nose bleeding: no Rectal bleeding: no Prolonged menstrual cycles: N/A Eating diet with consistent amounts of foods containing Vitamin K:yes Any recent antibiotic use? no  Swelling is doing better. He notes that he has been doing well in terms of that.   Fell on Thursday and cut his legs and his head, thinks that he scraped his head on a hassock, but didn't hit his head.   UPPER RESPIRATORY TRACT INFECTION Duration: 4-5 days Worst symptom: cough Fever: no Cough: yes Shortness of breath: baseline Wheezing: yes Chest pain: yes, with cough Chest tightness: yes Chest congestion: yes Nasal congestion: yes Runny nose: yes Post nasal drip: yes Sneezing: no Sore throat: no Swollen glands: no Sinus pressure: yes Headache: no Face pain: no Toothache: no Ear pain: no  Ear pressure: yes, bilateral Eyes red/itching:yes Eye drainage/crusting: yes  Vomiting: no Rash: no Fatigue: yes Sick contacts: no Strep contacts: no  Context: worse Recurrent sinusitis: no Relief with OTC cold/cough medications: no  Treatments attempted: none  Relevant past medical, surgical, family and social history reviewed and updated as indicated. Interim medical history since our last visit reviewed. Allergies and medications reviewed and updated.  Review of Systems  Constitutional: Negative.   HENT:  Positive for congestion, hearing loss, postnasal drip, rhinorrhea and sinus pressure. Negative for dental problem, drooling, ear discharge, ear pain, facial swelling, mouth sores, nosebleeds, sinus pain, sneezing, sore throat, tinnitus, trouble swallowing and voice change.   Eyes: Negative.   Respiratory: Positive for cough, chest tightness, shortness of breath and wheezing. Negative for apnea, choking and stridor.   Cardiovascular: Negative.   Musculoskeletal: Negative.   Neurological: Negative.   Psychiatric/Behavioral: Negative.     Per HPI unless specifically indicated above     Objective:    BP (!) 158/67 (BP Location: Left Arm, Patient Position: Sitting, Cuff Size: Normal)   Pulse 97   Temp 98.4 F (36.9 C)   Wt 164 lb 2 oz (74.4 kg)   SpO2 96%   BMI 28.17 kg/m   Wt Readings from Last 3 Encounters:  05/02/18 164 lb 2 oz (74.4 kg)  04/17/18 166 lb 14.4 oz (75.7 kg)  03/23/18 165 lb 9.6 oz (75.1 kg)    Physical Exam  Constitutional: He is oriented to person, place, and time. He appears well-developed and well-nourished. No distress.  HENT:  Head: Normocephalic and atraumatic.  Right Ear: Hearing and external ear normal.  Left Ear: Hearing and external ear normal.  Nose: Nose normal.  Mouth/Throat: Oropharynx is clear and moist. No oropharyngeal exudate.  Eyes: Pupils are equal, round, and reactive to light. Conjunctivae, EOM and lids are normal. Right eye exhibits no discharge. Left eye exhibits no discharge. No scleral icterus.  Neck: Normal range of motion. Neck supple. No JVD present.  No tracheal deviation present. No thyromegaly present.  Cardiovascular: Normal rate, normal heart sounds and intact distal pulses. An irregularly irregular rhythm present. Exam reveals no gallop and no friction rub.  No murmur heard. Pulmonary/Chest: Effort normal. No stridor. No respiratory distress. He has wheezes. He has no rales. He exhibits no tenderness.  Musculoskeletal: Normal  range of motion.  Lymphadenopathy:    He has no cervical adenopathy.  Neurological: He is alert and oriented to person, place, and time.  Skin: Skin is warm, dry and intact. Capillary refill takes less than 2 seconds. No rash noted. He is not diaphoretic. No erythema. No pallor.  Psychiatric: He has a normal mood and affect. His speech is normal and behavior is normal. Judgment and thought content normal. Cognition and memory are normal.  Nursing note and vitals reviewed.   Results for orders placed or performed in visit on 04/17/18  CoaguChek XS/INR Waived  Result Value Ref Range   INR 4.8 (H) 0.9 - 1.1   Prothrombin Time 57.2 sec  Basic metabolic panel  Result Value Ref Range   Glucose 158 (H) 65 - 99 mg/dL   BUN 24 8 - 27 mg/dL   Creatinine, Ser 1.09 0.76 - 1.27 mg/dL   GFR calc non Af Amer 65 >59 mL/min/1.73   GFR calc Af Amer 75 >59 mL/min/1.73   BUN/Creatinine Ratio 22 10 - 24   Sodium 135 134 - 144 mmol/L   Potassium 4.5 3.5 - 5.2 mmol/L   Chloride 94 (L) 96 - 106 mmol/L   CO2 22 20 - 29 mmol/L   Calcium 9.6 8.6 - 10.2 mg/dL  B Nat Peptide  Result Value Ref Range   BNP 1,882.8 (H) 0.0 - 100.0 pg/mL      Assessment & Plan:   Problem List Items Addressed This Visit      Cardiovascular and Mediastinum   Atrial fibrillation (HCC) - Primary (Chronic)    Doing well with INR of 3.3. Continue current regimen. Continue to monitor. Recheck 1 month.       Relevant Orders   CoaguChek XS/INR Waived     Other   History of prosthetic heart valve (Chronic)    Doing well with INR of 3.3. Continue current regimen. Continue to monitor. Recheck 1 month.       Relevant Orders   CoaguChek XS/INR Waived    Other Visit Diagnoses    Peripheral edema       No rales. No edema. Continue lasix. Rechecking BMP today. Await results.    Relevant Orders   Basic metabolic panel   COPD exacerbation ([place])       Will treat with prednisone and azithromycin. Recheck with PCP next week. Call  with any concerns.    Relevant Medications   azithromycin (ZITHROMAX) 250 MG tablet   predniSONE (DELTASONE) 10 MG tablet       Follow up plan: Return in about 1 month (around 05/30/2018) for INR.

## 2018-05-02 NOTE — Assessment & Plan Note (Signed)
Doing well with INR of 3.3. Continue current regimen. Continue to monitor. Recheck 1 month.

## 2018-05-03 LAB — BASIC METABOLIC PANEL
BUN/Creatinine Ratio: 16 (ref 10–24)
BUN: 17 mg/dL (ref 8–27)
CHLORIDE: 89 mmol/L — AB (ref 96–106)
CO2: 24 mmol/L (ref 20–29)
Calcium: 9.5 mg/dL (ref 8.6–10.2)
Creatinine, Ser: 1.04 mg/dL (ref 0.76–1.27)
GFR, EST AFRICAN AMERICAN: 80 mL/min/{1.73_m2} (ref >59)
GFR, EST NON AFRICAN AMERICAN: 69 mL/min/{1.73_m2} (ref >59)
Glucose: 89 mg/dL (ref 65–99)
POTASSIUM: 4.4 mmol/L (ref 3.5–5.2)
SODIUM: 135 mmol/L (ref 134–144)

## 2018-05-08 ENCOUNTER — Encounter: Payer: Self-pay | Admitting: Family Medicine

## 2018-05-08 ENCOUNTER — Ambulatory Visit (INDEPENDENT_AMBULATORY_CARE_PROVIDER_SITE_OTHER): Payer: Medicare Other | Admitting: Family Medicine

## 2018-05-08 VITALS — BP 139/84 | HR 100 | Wt 158.0 lb

## 2018-05-08 DIAGNOSIS — D692 Other nonthrombocytopenic purpura: Secondary | ICD-10-CM | POA: Diagnosis not present

## 2018-05-08 DIAGNOSIS — E7800 Pure hypercholesterolemia, unspecified: Secondary | ICD-10-CM

## 2018-05-08 DIAGNOSIS — I10 Essential (primary) hypertension: Secondary | ICD-10-CM | POA: Diagnosis not present

## 2018-05-08 DIAGNOSIS — I482 Chronic atrial fibrillation, unspecified: Secondary | ICD-10-CM

## 2018-05-08 LAB — LP+ALT+AST PICCOLO, WAIVED
ALT (SGPT) Piccolo, Waived: 23 U/L (ref 10–47)
AST (SGOT) PICCOLO, WAIVED: 40 U/L — AB (ref 11–38)
CHOL/HDL RATIO PICCOLO,WAIVE: 2 mg/dL
CHOLESTEROL PICCOLO, WAIVED: 166 mg/dL (ref <200)
HDL Chol Piccolo, Waived: 84 mg/dL (ref >59)
LDL Chol Calc Piccolo Waived: 66 mg/dL (ref <100)
TRIGLYCERIDES PICCOLO,WAIVED: 75 mg/dL (ref <150)
VLDL Chol Calc Piccolo,Waive: 15 mg/dL (ref <30)

## 2018-05-08 NOTE — Progress Notes (Signed)
BP 139/84   Pulse 100   Wt 158 lb (71.7 kg)   SpO2 92%   BMI 27.12 kg/m    Subjective:    [name], male    DOB: 09-23-1941, 77 y.o.   MRN: [id number]  HPI: Kirk Jones is a 77 y.o. male  Chief Complaint  Patient presents with  . Follow-up  . Hypertension  . Hyperlipidemia  Patient accompanied by his wife who assists with history. Patient's primary concern is getting over his bronchitis which is very slow.  Patient had an orthostatic event 1 week ago has recovered but still has some abrasions from his fall.  Otherwise is doing okay his wife is concerned about him being lightheaded.  Reviewed medications and hydration status. Patient with no chest pain chest tightness or other cardiac type symptoms.   Relevant past medical, surgical, family and social history reviewed and updated as indicated. Interim medical history since our last visit reviewed. Allergies and medications reviewed and updated.  Review of Systems  Constitutional: Negative.   Respiratory: Negative.   Cardiovascular: Negative.     Per HPI unless specifically indicated above     Objective:    BP 139/84   Pulse 100   Wt 158 lb (71.7 kg)   SpO2 92%   BMI 27.12 kg/m   Wt Readings from Last 3 Encounters:  05/08/18 158 lb (71.7 kg)  05/02/18 164 lb 2 oz (74.4 kg)  04/17/18 166 lb 14.4 oz (75.7 kg)    Physical Exam  Constitutional: He is oriented to person, place, and time. He appears well-developed and well-nourished.  HENT:  Head: Normocephalic and atraumatic.  Eyes: Conjunctivae and EOM are normal.  Neck: Normal range of motion.  Cardiovascular: Normal rate, regular rhythm and normal heart sounds.  Pulmonary/Chest: Effort normal and breath sounds normal.  Musculoskeletal: Normal range of motion.  Neurological: He is alert and oriented to person, place, and time.  Skin: No erythema.  Psychiatric: He has a normal mood and affect. His behavior is normal. Judgment and  thought content normal.    Results for orders placed or performed in visit on 05/02/18  CoaguChek XS/INR Waived  Result Value Ref Range   INR 3.3 (H) 0.9 - 1.1   Prothrombin Time 39.1 sec  Basic metabolic panel  Result Value Ref Range   Glucose 89 65 - 99 mg/dL   BUN 17 8 - 27 mg/dL   Creatinine, Ser 1.04 0.76 - 1.27 mg/dL   GFR calc non Af Amer 69 >59 mL/min/1.73   GFR calc Af Amer 80 >59 mL/min/1.73   BUN/Creatinine Ratio 16 10 - 24   Sodium 135 134 - 144 mmol/L   Potassium 4.4 3.5 - 5.2 mmol/L   Chloride 89 (L) 96 - 106 mmol/L   CO2 24 20 - 29 mmol/L   Calcium 9.5 8.6 - 10.2 mg/dL      Assessment & Plan:   Problem List Items Addressed This Visit      Cardiovascular and Mediastinum   Atrial fibrillation (HCC) (Chronic)    Discussed medications blood thinner cautions with falling.  Using a cane.      Relevant Medications   diltiazem (CARDIZEM CD) 180 MG 24 hr capsule   Hypertension - Primary    The current medical regimen is effective;  continue present plan and medications. Discuss hypertension medications fluid status and orthostatic changes with Valsalva maneuver       Relevant Medications   diltiazem (CARDIZEM  CD) 180 MG 24 hr capsule   Other Relevant Orders   Basic metabolic panel   LP+ALT+AST Piccolo, Waived   Senile purpura (HCC)    Stable       Relevant Medications   diltiazem (CARDIZEM CD) 180 MG 24 hr capsule     Other   Hyperlipidemia    The current medical regimen is effective;  continue present plan and medications.       Relevant Medications   diltiazem (CARDIZEM CD) 180 MG 24 hr capsule   Other Relevant Orders   Basic metabolic panel   LP+ALT+AST Piccolo, Waived       Follow up plan: Return in about 6 months (around 11/07/2018) for Physical Exam.

## 2018-05-08 NOTE — Assessment & Plan Note (Signed)
Stable

## 2018-05-08 NOTE — Assessment & Plan Note (Signed)
The current medical regimen is effective;  continue present plan and medications.  

## 2018-05-08 NOTE — Assessment & Plan Note (Addendum)
The current medical regimen is effective;  continue present plan and medications. Discuss hypertension medications fluid status and orthostatic changes with Valsalva maneuver

## 2018-05-08 NOTE — Assessment & Plan Note (Signed)
Discussed medications blood thinner cautions with falling.  Using a cane.

## 2018-05-09 ENCOUNTER — Encounter: Payer: Self-pay | Admitting: Family Medicine

## 2018-05-09 LAB — BASIC METABOLIC PANEL
BUN/Creatinine Ratio: 22 (ref 10–24)
BUN: 21 mg/dL (ref 8–27)
CO2: 27 mmol/L (ref 20–29)
CREATININE: 0.96 mg/dL (ref 0.76–1.27)
Calcium: 9.7 mg/dL (ref 8.6–10.2)
Chloride: 90 mmol/L — ABNORMAL LOW (ref 96–106)
GFR, EST AFRICAN AMERICAN: 88 mL/min/{1.73_m2} (ref >59)
GFR, EST NON AFRICAN AMERICAN: 76 mL/min/{1.73_m2} (ref >59)
Glucose: 116 mg/dL — ABNORMAL HIGH (ref 65–99)
Potassium: 4.3 mmol/L (ref 3.5–5.2)
Sodium: 136 mmol/L (ref 134–144)

## 2018-05-24 DIAGNOSIS — I6300 Cerebral infarction due to thrombosis of unspecified precerebral artery: Secondary | ICD-10-CM | POA: Diagnosis not present

## 2018-05-24 DIAGNOSIS — Z8673 Personal history of transient ischemic attack (TIA), and cerebral infarction without residual deficits: Secondary | ICD-10-CM | POA: Diagnosis not present

## 2018-05-24 DIAGNOSIS — J449 Chronic obstructive pulmonary disease, unspecified: Secondary | ICD-10-CM | POA: Diagnosis not present

## 2018-05-24 DIAGNOSIS — I480 Paroxysmal atrial fibrillation: Secondary | ICD-10-CM | POA: Diagnosis not present

## 2018-05-24 DIAGNOSIS — Z952 Presence of prosthetic heart valve: Secondary | ICD-10-CM | POA: Diagnosis not present

## 2018-05-24 DIAGNOSIS — E782 Mixed hyperlipidemia: Secondary | ICD-10-CM | POA: Diagnosis not present

## 2018-05-24 DIAGNOSIS — I2510 Atherosclerotic heart disease of native coronary artery without angina pectoris: Secondary | ICD-10-CM | POA: Diagnosis not present

## 2018-05-24 DIAGNOSIS — I472 Ventricular tachycardia: Secondary | ICD-10-CM | POA: Diagnosis not present

## 2018-05-24 DIAGNOSIS — Z95810 Presence of automatic (implantable) cardiac defibrillator: Secondary | ICD-10-CM | POA: Diagnosis not present

## 2018-05-24 DIAGNOSIS — I5032 Chronic diastolic (congestive) heart failure: Secondary | ICD-10-CM | POA: Diagnosis not present

## 2018-05-24 DIAGNOSIS — I5022 Chronic systolic (congestive) heart failure: Secondary | ICD-10-CM | POA: Diagnosis not present

## 2018-05-24 DIAGNOSIS — Z7901 Long term (current) use of anticoagulants: Secondary | ICD-10-CM | POA: Diagnosis not present

## 2018-06-06 ENCOUNTER — Ambulatory Visit (INDEPENDENT_AMBULATORY_CARE_PROVIDER_SITE_OTHER): Payer: Medicare Other | Admitting: Family Medicine

## 2018-06-06 ENCOUNTER — Encounter: Payer: Self-pay | Admitting: Family Medicine

## 2018-06-06 VITALS — BP 116/65 | HR 73 | Ht 63.0 in | Wt 155.0 lb

## 2018-06-06 DIAGNOSIS — Z952 Presence of prosthetic heart valve: Secondary | ICD-10-CM | POA: Diagnosis not present

## 2018-06-06 DIAGNOSIS — I5022 Chronic systolic (congestive) heart failure: Secondary | ICD-10-CM

## 2018-06-06 DIAGNOSIS — I482 Chronic atrial fibrillation, unspecified: Secondary | ICD-10-CM

## 2018-06-06 LAB — COAGUCHEK XS/INR WAIVED
INR: 2.6 — ABNORMAL HIGH (ref 0.9–1.1)
Prothrombin Time: 31.1 s

## 2018-06-06 NOTE — Assessment & Plan Note (Signed)
The current medical regimen is effective;  continue present plan and medications. Followed by cardiology and stable especially with change in fluid pills

## 2018-06-06 NOTE — Assessment & Plan Note (Signed)
The current medical regimen is effective;  continue present plan and medications.  

## 2018-06-06 NOTE — Progress Notes (Signed)
BP 116/65   Pulse 73   Ht 5\' 3"  (1.6 m)   Wt 155 lb (70.3 kg)   SpO2 98%   BMI 27.46 kg/m    Subjective:    [name], male    DOB: 05-06-1941, 77 y.o.   MRN: [id number]  HPI: Kirk Jones is a 77 y.o. male  Chief Complaint  Patient presents with  . Coagulation Disorder  Patient taking warfarin without problems no bleeding bruising issues except for his usual senile purpura on his arms.  Has had some fragile skin changes also but that is nothing new. Cardiology is changed his diuretic somewhat and is been able to lose 10 pounds and is feeling better.   INR has been good for 2 months in a row.   Relevant past medical, surgical, family and social history reviewed and updated as indicated. Interim medical history since our last visit reviewed. Allergies and medications reviewed and updated.  Review of Systems  Constitutional: Negative.   Respiratory: Negative.   Cardiovascular: Negative.     Per HPI unless specifically indicated above     Objective:    BP 116/65   Pulse 73   Ht 5\' 3"  (1.6 m)   Wt 155 lb (70.3 kg)   SpO2 98%   BMI 27.46 kg/m   Wt Readings from Last 3 Encounters:  06/06/18 155 lb (70.3 kg)  05/08/18 158 lb (71.7 kg)  05/02/18 164 lb 2 oz (74.4 kg)    Physical Exam  Constitutional: He is oriented to person, place, and time. He appears well-developed and well-nourished.  HENT:  Head: Normocephalic and atraumatic.  Eyes: Conjunctivae and EOM are normal.  Neck: Normal range of motion.  Cardiovascular: Normal rate, regular rhythm and normal heart sounds.  Pulmonary/Chest: Effort normal and breath sounds normal.  Musculoskeletal: Normal range of motion.  Neurological: He is alert and oriented to person, place, and time.  Skin: No erythema.  Psychiatric: He has a normal mood and affect. His behavior is normal. Judgment and thought content normal.    Results for orders placed or performed in visit on 09/24/24  Basic  metabolic panel  Result Value Ref Range   Glucose 116 (H) 65 - 99 mg/dL   BUN 21 8 - 27 mg/dL   Creatinine, Ser 0.96 0.76 - 1.27 mg/dL   GFR calc non Af Amer 76 >59 mL/min/1.73   GFR calc Af Amer 88 >59 mL/min/1.73   BUN/Creatinine Ratio 22 10 - 24   Sodium 136 134 - 144 mmol/L   Potassium 4.3 3.5 - 5.2 mmol/L   Chloride 90 (L) 96 - 106 mmol/L   CO2 27 20 - 29 mmol/L   Calcium 9.7 8.6 - 10.2 mg/dL  LP+ALT+AST Piccolo, Waived  Result Value Ref Range   ALT (SGPT) Piccolo, Waived 23 10 - 47 U/L   AST (SGOT) Piccolo, Waived 40 (H) 11 - 38 U/L   Cholesterol Piccolo, Waived 166 <200 mg/dL   HDL Chol Piccolo, Waived 84 >59 mg/dL   Triglycerides Piccolo,Waived 75 <150 mg/dL   Chol/HDL Ratio Piccolo,Waive 2.0 mg/dL   LDL Chol Calc Piccolo Waived 66 <100 mg/dL   VLDL Chol Calc Piccolo,Waive 15 <30 mg/dL      Assessment & Plan:   Problem List Items Addressed This Visit      Cardiovascular and Mediastinum   Atrial fibrillation (HCC) - Primary (Chronic)    The current medical regimen is effective;  continue present plan and medications.  Relevant Orders   CoaguChek XS/INR Waived   Systolic congestive heart failure ([place])    The current medical regimen is effective;  continue present plan and medications. Followed by cardiology and stable especially with change in fluid pills        Other   History of prosthetic heart valve (Chronic)    The current medical regimen is effective;  continue present plan and medications.           Follow up plan: Return in about 1 month (around 07/04/2018) for PT INR.

## 2018-06-07 DIAGNOSIS — E785 Hyperlipidemia, unspecified: Secondary | ICD-10-CM | POA: Diagnosis not present

## 2018-06-07 DIAGNOSIS — Z7901 Long term (current) use of anticoagulants: Secondary | ICD-10-CM | POA: Diagnosis not present

## 2018-06-07 DIAGNOSIS — R0602 Shortness of breath: Secondary | ICD-10-CM | POA: Diagnosis not present

## 2018-06-07 DIAGNOSIS — I10 Essential (primary) hypertension: Secondary | ICD-10-CM | POA: Diagnosis not present

## 2018-06-07 DIAGNOSIS — I472 Ventricular tachycardia: Secondary | ICD-10-CM | POA: Diagnosis not present

## 2018-06-07 DIAGNOSIS — R609 Edema, unspecified: Secondary | ICD-10-CM | POA: Diagnosis not present

## 2018-06-07 DIAGNOSIS — Z952 Presence of prosthetic heart valve: Secondary | ICD-10-CM | POA: Diagnosis not present

## 2018-06-07 DIAGNOSIS — Z79899 Other long term (current) drug therapy: Secondary | ICD-10-CM | POA: Diagnosis not present

## 2018-06-07 DIAGNOSIS — I480 Paroxysmal atrial fibrillation: Secondary | ICD-10-CM | POA: Diagnosis not present

## 2018-06-07 DIAGNOSIS — Z95810 Presence of automatic (implantable) cardiac defibrillator: Secondary | ICD-10-CM | POA: Diagnosis not present

## 2018-06-07 DIAGNOSIS — I2510 Atherosclerotic heart disease of native coronary artery without angina pectoris: Secondary | ICD-10-CM | POA: Diagnosis not present

## 2018-06-07 DIAGNOSIS — I5022 Chronic systolic (congestive) heart failure: Secondary | ICD-10-CM | POA: Diagnosis not present

## 2018-06-12 DIAGNOSIS — Z79899 Other long term (current) drug therapy: Secondary | ICD-10-CM | POA: Diagnosis not present

## 2018-06-19 ENCOUNTER — Other Ambulatory Visit: Payer: Self-pay | Admitting: Family Medicine

## 2018-06-20 DIAGNOSIS — H35372 Puckering of macula, left eye: Secondary | ICD-10-CM | POA: Diagnosis not present

## 2018-06-20 DIAGNOSIS — H401131 Primary open-angle glaucoma, bilateral, mild stage: Secondary | ICD-10-CM | POA: Diagnosis not present

## 2018-06-20 DIAGNOSIS — H26493 Other secondary cataract, bilateral: Secondary | ICD-10-CM | POA: Diagnosis not present

## 2018-06-20 DIAGNOSIS — Z79899 Other long term (current) drug therapy: Secondary | ICD-10-CM | POA: Diagnosis not present

## 2018-06-21 NOTE — Telephone Encounter (Signed)
Warfarin 3 mg tab refill Last Refill:02/01/18 # 30 3 RF Last OV: 06/06/18 PCP: Dr Jeananne Rama Pharmacy:[facility]

## 2018-07-05 ENCOUNTER — Encounter: Payer: Self-pay | Admitting: Family Medicine

## 2018-07-05 ENCOUNTER — Ambulatory Visit (INDEPENDENT_AMBULATORY_CARE_PROVIDER_SITE_OTHER): Payer: Medicare Other | Admitting: Family Medicine

## 2018-07-05 VITALS — BP 134/82 | HR 105 | Temp 98.7°F | Ht 63.0 in | Wt 162.3 lb

## 2018-07-05 DIAGNOSIS — I4891 Unspecified atrial fibrillation: Secondary | ICD-10-CM

## 2018-07-05 DIAGNOSIS — N138 Other obstructive and reflux uropathy: Secondary | ICD-10-CM

## 2018-07-05 DIAGNOSIS — N403 Nodular prostate with lower urinary tract symptoms: Secondary | ICD-10-CM | POA: Diagnosis not present

## 2018-07-05 DIAGNOSIS — K4090 Unilateral inguinal hernia, without obstruction or gangrene, not specified as recurrent: Secondary | ICD-10-CM | POA: Diagnosis not present

## 2018-07-05 LAB — COAGUCHEK XS/INR WAIVED
INR: 4.2 — ABNORMAL HIGH (ref 0.9–1.1)
Prothrombin Time: 50.3 s

## 2018-07-05 NOTE — Progress Notes (Signed)
BP 134/82   Pulse (!) 105   Temp 98.7 F (37.1 C) (Oral)   Ht 5\' 3"  (1.6 m)   Wt 162 lb 4.8 oz (73.6 kg)   SpO2 95%   BMI 28.75 kg/m    Subjective:    Patient ID: Nash Shearer, male    DOB: Mar 07, 1941, 77 y.o.   MRN: [id number]  HPI: Kiyaan Haq is a 77 y.o. male  Chief Complaint  Patient presents with  . Atrial Fibrillation   Pt here today for INR f/u.Taking 3.5 daily. No bleeding or bruising issues other than his usual bruises on forearms which are stable. No new medications other than recently adding CBD oil for his joint pains. Diet has not been very stable.   Left inguinal hernia noted the past month or so, becoming increasing painful but still reducible. Recent right inguinal hernia repair last year.   Having worsening urinary stream pressure despite being on flomax and proscar. Denies prostate pain, dysuria, hematuria.   Past Medical History:  Diagnosis Date  . Aortic valve regurgitation   . Atrial fibrillation ([place])   . Barrett's esophagus   . COPD (chronic obstructive pulmonary disease) ([place])   . Diverticulosis   . Gout   . Headache    aura only  . Hematuria   . History of prosthetic heart valve   . Hyperlipidemia   . Hypertension   . Nodular prostate with urinary obstruction   . Substance abuse ([place])    alcohol   Social History   Socioeconomic History  . Marital status: Married    Spouse name: Not on file  . Number of children: Not on file  . Years of education: Not on file  . Highest education level: Not on file  Occupational History  . Not on file  Social Needs  . Financial resource strain: Not hard at all  . Food insecurity:    Worry: Never true    Inability: Never true  . Transportation needs:    Medical: No    Non-medical: No  Tobacco Use  . Smoking status: Former Smoker    Types: Cigarettes    Last attempt to quit: 04/24/1991    Years since quitting: 27.2  . Smokeless tobacco: Never Used  Substance and Sexual Activity   . Alcohol use: Yes    Alcohol/week: 14.4 oz    Types: 24 Shots of liquor per week    Comment: quit December 2015/restarted  . Drug use: No  . Sexual activity: Never  Lifestyle  . Physical activity:    Days per week: 0 days    Minutes per session: 0 min  . Stress: Not at all  Relationships  . Social connections:    Talks on phone: Once a week    Gets together: Once a week    Attends religious service: More than 4 times per year    Active member of club or organization: No    Attends meetings of clubs or organizations: Never    Relationship status: Married  . Intimate partner violence:    Fear of current or ex partner: No    Emotionally abused: No    Physically abused: No    Forced sexual activity: No  Other Topics Concern  . Not on file  Social History Narrative  . Not on file   Relevant past medical, surgical, family and social history reviewed and updated as indicated. Interim medical history since our last visit reviewed. Allergies and medications  reviewed and updated.  Review of Systems  Per HPI unless specifically indicated above     Objective:    BP 134/82   Pulse (!) 105   Temp 98.7 F (37.1 C) (Oral)   Ht 5\' 3"  (1.6 m)   Wt 162 lb 4.8 oz (73.6 kg)   SpO2 95%   BMI 28.75 kg/m   Wt Readings from Last 3 Encounters:  07/05/18 162 lb 4.8 oz (73.6 kg)  06/06/18 155 lb (70.3 kg)  05/08/18 158 lb (71.7 kg)    Physical Exam  Constitutional: He is oriented to person, place, and time. He appears well-developed and well-nourished.  HENT:  Head: Atraumatic.  Eyes: EOM are normal.  Neck: Normal range of motion. Neck supple.  Cardiovascular: Normal rate and regular rhythm.  Pulmonary/Chest: Effort normal and breath sounds normal.  Abdominal: A hernia is present. Hernia confirmed positive in the left inguinal area (ttp, reducible).  Musculoskeletal: He exhibits no edema.  Gait at baseline  Neurological: He is alert and oriented to person, place, and time.    Skin: Skin is warm and dry.  Stable bruising b/l forearms  Psychiatric: He has a normal mood and affect. His behavior is normal.  Nursing note and vitals reviewed.   Results for orders placed or performed in visit on 06/06/18  CoaguChek XS/INR Waived  Result Value Ref Range   INR 2.6 (H) 0.9 - 1.1   Prothrombin Time 31.1 sec      Assessment & Plan:   Problem List Items Addressed This Visit      Cardiovascular and Mediastinum   Atrial fibrillation ([place]) - Primary (Chronic)    INR not at goal today, 4.4. Will recheck in 2 weeks, work on making diet more stable in meantime. Continue 3.5 mg dose. F/u sooner with bleeding or bruising issues.       Relevant Orders   CoaguChek XS/INR Waived     Genitourinary   Nodular prostate with urinary obstruction    Will increase to 0.8 mg flomax for the next 2 weeks in addition to the proscar, if benefiting from that will stay on that dose. F/u in 2 weeks       Other Visit Diagnoses    Left inguinal hernia       Already established with surgeon from previous hernia. Pt aware to call when ready to be evaluated. Return precautions reviewed for emergent hernia sxs       Follow up plan: Return in about 2 weeks (around 07/19/2018) for INR.

## 2018-07-05 NOTE — Patient Instructions (Signed)
Take 2 tamsulosin tabs daily (0.8 mg)

## 2018-07-05 NOTE — Assessment & Plan Note (Signed)
Will increase to 0.8 mg flomax for the next 2 weeks in addition to the proscar, if benefiting from that will stay on that dose. F/u in 2 weeks

## 2018-07-05 NOTE — Assessment & Plan Note (Signed)
INR not at goal today, 4.4. Will recheck in 2 weeks, work on making diet more stable in meantime. Continue 3.5 mg dose. F/u sooner with bleeding or bruising issues.

## 2018-07-06 DIAGNOSIS — Z95810 Presence of automatic (implantable) cardiac defibrillator: Secondary | ICD-10-CM | POA: Diagnosis not present

## 2018-07-06 DIAGNOSIS — I5022 Chronic systolic (congestive) heart failure: Secondary | ICD-10-CM | POA: Diagnosis not present

## 2018-07-06 DIAGNOSIS — Z7901 Long term (current) use of anticoagulants: Secondary | ICD-10-CM | POA: Diagnosis not present

## 2018-07-06 DIAGNOSIS — Z9861 Coronary angioplasty status: Secondary | ICD-10-CM | POA: Diagnosis not present

## 2018-07-06 DIAGNOSIS — I480 Paroxysmal atrial fibrillation: Secondary | ICD-10-CM | POA: Diagnosis not present

## 2018-07-06 DIAGNOSIS — I5032 Chronic diastolic (congestive) heart failure: Secondary | ICD-10-CM | POA: Diagnosis not present

## 2018-07-06 DIAGNOSIS — E782 Mixed hyperlipidemia: Secondary | ICD-10-CM | POA: Diagnosis not present

## 2018-07-06 DIAGNOSIS — I2510 Atherosclerotic heart disease of native coronary artery without angina pectoris: Secondary | ICD-10-CM | POA: Diagnosis not present

## 2018-07-06 DIAGNOSIS — I6300 Cerebral infarction due to thrombosis of unspecified precerebral artery: Secondary | ICD-10-CM | POA: Diagnosis not present

## 2018-07-06 DIAGNOSIS — J449 Chronic obstructive pulmonary disease, unspecified: Secondary | ICD-10-CM | POA: Diagnosis not present

## 2018-07-06 DIAGNOSIS — Z952 Presence of prosthetic heart valve: Secondary | ICD-10-CM | POA: Diagnosis not present

## 2018-07-06 DIAGNOSIS — I472 Ventricular tachycardia: Secondary | ICD-10-CM | POA: Diagnosis not present

## 2018-07-11 DIAGNOSIS — I472 Ventricular tachycardia: Secondary | ICD-10-CM | POA: Diagnosis not present

## 2018-07-12 ENCOUNTER — Other Ambulatory Visit: Payer: Self-pay | Admitting: Family Medicine

## 2018-07-12 NOTE — Telephone Encounter (Signed)
finasteride refill Last Refill:12/05/17 # 90 2 RF Last OV: 07/05/18 PCP: Dr Jeananne Rama Pharmacy:[facility]

## 2018-07-20 ENCOUNTER — Telehealth: Payer: Self-pay

## 2018-07-20 NOTE — Telephone Encounter (Signed)
Copied from [place] [id number]. Topic: Medicare AWV >> Jul 20, 2018  1:35 PM [place], [place] A, LPN wrote: Reason for CRM:  called to reschedule medicare wellness visit from 10/16/2018 to a Thursday after 10/13/2018. Can be same day as Dr. Jeananne Rama or 7 days prior (11/02/2018) if patient wants labs drawn.  Note: Nurse Health Advisor-Anslie Spadafora,LPN with be on Maternity Leave 09/29/2018-12/22/2018, All Medicare Annual Wellness visits between 09/29/2018 and 12/22/2018 need to be scheduled on a Thursday with the substitute Ambia. Please call or skype Jill Alexanders with any questions. [phone].

## 2018-07-26 ENCOUNTER — Ambulatory Visit: Payer: Medicare Other | Admitting: Family Medicine

## 2018-07-27 ENCOUNTER — Encounter: Payer: Self-pay | Admitting: Family Medicine

## 2018-07-27 ENCOUNTER — Other Ambulatory Visit: Payer: Self-pay

## 2018-07-27 ENCOUNTER — Ambulatory Visit (INDEPENDENT_AMBULATORY_CARE_PROVIDER_SITE_OTHER): Payer: Medicare Other | Admitting: Family Medicine

## 2018-07-27 VITALS — BP 142/80 | HR 98 | Temp 98.4°F | Ht 63.0 in | Wt 164.1 lb

## 2018-07-27 DIAGNOSIS — Z952 Presence of prosthetic heart valve: Secondary | ICD-10-CM

## 2018-07-27 DIAGNOSIS — I4891 Unspecified atrial fibrillation: Secondary | ICD-10-CM | POA: Diagnosis not present

## 2018-07-27 DIAGNOSIS — N403 Nodular prostate with lower urinary tract symptoms: Secondary | ICD-10-CM

## 2018-07-27 DIAGNOSIS — N138 Other obstructive and reflux uropathy: Secondary | ICD-10-CM | POA: Diagnosis not present

## 2018-07-27 DIAGNOSIS — L309 Dermatitis, unspecified: Secondary | ICD-10-CM

## 2018-07-27 LAB — COAGUCHEK XS/INR WAIVED
INR: 3.7 — ABNORMAL HIGH (ref 0.9–1.1)
PROTHROMBIN TIME: 44.2 s

## 2018-07-27 MED ORDER — TAMSULOSIN HCL 0.4 MG PO CAPS: 0.8000 mg | ORAL_CAPSULE | Freq: Every day | ORAL | 4 refills | Status: DC

## 2018-07-27 MED ORDER — LORATADINE 10 MG PO TABS: 10.0000 mg | ORAL_TABLET | Freq: Every day | ORAL | 3 refills | Status: DC

## 2018-07-27 NOTE — Progress Notes (Signed)
BP (!) 142/80   Pulse 98   Temp 98.4 F (36.9 C) (Oral)   Ht 5\' 3"  (1.6 m)   Wt 164 lb 1.6 oz (74.4 kg)   SpO2 93%   BMI 29.07 kg/m    Subjective:    [name], male    DOB: May 22, 1941, 77 y.o.   MRN: [id number]  HPI: Kirk Jones is a 77 y.o. male  Chief Complaint  Patient presents with  . Coagulation Disorder    pt would like to discuss about skin and dificulty breathing   Here today for 2 week INR recheck. Still taking 3.5 mg coumadin daily. No bleeding or bruising issues, CP, palpitations. Diet stable, no new medications.   Using desonide and goldbond lotion on face but still having dryness and itchiness. Does admit to poor compliance, using medications once daily maximum rather than twice daily.   Increased flomax over the past 2 weeks, not seen much improvement on it. No side effects with the increase. Still urinating several times a night and having poor stream.   Past Medical History:  Diagnosis Date  . Aortic valve regurgitation   . Atrial fibrillation ([place])   . Barrett's esophagus   . COPD (chronic obstructive pulmonary disease) ([place])   . Diverticulosis   . Gout   . Headache    aura only  . Hematuria   . History of prosthetic heart valve   . Hyperlipidemia   . Hypertension   . Nodular prostate with urinary obstruction   . Substance abuse ([place])    alcohol   Social History   Socioeconomic History  . Marital status: Married    Spouse name: Not on file  . Number of children: Not on file  . Years of education: Not on file  . Highest education level: Not on file  Occupational History  . Not on file  Social Needs  . Financial resource strain: Not hard at all  . Food insecurity:    Worry: Never true    Inability: Never true  . Transportation needs:    Medical: No    Non-medical: No  Tobacco Use  . Smoking status: Former Smoker    Types: Cigarettes    Last attempt to quit: 04/24/1991    Years since quitting: 27.2  .  Smokeless tobacco: Never Used  Substance and Sexual Activity  . Alcohol use: Yes    Alcohol/week: 24.0 standard drinks    Types: 24 Shots of liquor per week    Comment: quit December 2015/restarted  . Drug use: No  . Sexual activity: Never  Lifestyle  . Physical activity:    Days per week: 0 days    Minutes per session: 0 min  . Stress: Not at all  Relationships  . Social connections:    Talks on phone: Once a week    Gets together: Once a week    Attends religious service: More than 4 times per year    Active member of club or organization: No    Attends meetings of clubs or organizations: Never    Relationship status: Married  . Intimate partner violence:    Fear of current or ex partner: No    Emotionally abused: No    Physically abused: No    Forced sexual activity: No  Other Topics Concern  . Not on file  Social History Narrative  . Not on file    Relevant past medical, surgical, family and social history  reviewed and updated as indicated. Interim medical history since our last visit reviewed. Allergies and medications reviewed and updated.  Review of Systems  Per HPI unless specifically indicated above     Objective:    BP (!) 142/80   Pulse 98   Temp 98.4 F (36.9 C) (Oral)   Ht 5\' 3"  (1.6 m)   Wt 164 lb 1.6 oz (74.4 kg)   SpO2 93%   BMI 29.07 kg/m   Wt Readings from Last 3 Encounters:  07/27/18 164 lb 1.6 oz (74.4 kg)  07/05/18 162 lb 4.8 oz (73.6 kg)  06/06/18 155 lb (70.3 kg)    Physical Exam  Constitutional: He is oriented to person, place, and time. He appears well-developed and well-nourished.  HENT:  Head: Atraumatic.  Eyes: Conjunctivae and EOM are normal.  Neck: Normal range of motion. Neck supple.  Cardiovascular: Normal rate and intact distal pulses.  Pulmonary/Chest: Effort normal and breath sounds normal.  Musculoskeletal: Normal range of motion.  Neurological: He is alert and oriented to person, place, and time.  Skin: Skin is  warm and dry. Rash (erythematous dry skin or face, worst around chin) noted.  Psychiatric: He has a normal mood and affect. His behavior is normal.  Nursing note and vitals reviewed.   Results for orders placed or performed in visit on 07/27/18  CoaguChek XS/INR Waived  Result Value Ref Range   INR 3.7 (H) 0.9 - 1.1   Prothrombin Time 44.2 sec      Assessment & Plan:   Problem List Items Addressed This Visit      Cardiovascular and Mediastinum   Atrial fibrillation ([place]) - Primary (Chronic)   Relevant Orders   CoaguChek XS/INR Waived (Completed)     Genitourinary   Nodular prostate with urinary obstruction    Minimal improvement noted with dose increase of flomax, will stay on for longer and monitor closely. Continue current regimen      Relevant Medications   tamsulosin (FLOMAX) 0.4 MG CAPS capsule     Other   History of prosthetic heart valve (Chronic)    INR still above goal, 3.7 today. Will decrease to 3.5 mg coumadin 5 days per week, 3 mg coumadin 2 days per week         Other Visit Diagnoses    Facial dermatitis       Increase compliance with topical regimen with desonide and good moisturization       Follow up plan: Return in about 2 weeks (around 08/10/2018) for INR, spirometry - 30 min visit.

## 2018-07-27 NOTE — Patient Instructions (Signed)
3.5 mg coumadin 5 days per week, 3 mg coumadin 2 days per week

## 2018-07-31 NOTE — Assessment & Plan Note (Signed)
INR still above goal, 3.7 today. Will decrease to 3.5 mg coumadin 5 days per week, 3 mg coumadin 2 days per week

## 2018-07-31 NOTE — Assessment & Plan Note (Signed)
Minimal improvement noted with dose increase of flomax, will stay on for longer and monitor closely. Continue current regimen

## 2018-08-07 ENCOUNTER — Other Ambulatory Visit: Payer: Self-pay | Admitting: Family Medicine

## 2018-08-10 ENCOUNTER — Encounter: Payer: Self-pay | Admitting: Family Medicine

## 2018-08-10 ENCOUNTER — Ambulatory Visit (INDEPENDENT_AMBULATORY_CARE_PROVIDER_SITE_OTHER): Payer: Medicare Other | Admitting: Family Medicine

## 2018-08-10 ENCOUNTER — Ambulatory Visit
Admission: RE | Admit: 2018-08-10 | Discharge: 2018-08-10 | Disposition: A | Discharge status: Home / Self Care | Payer: Medicare Other | Source: Ambulatory Visit | Attending: Family Medicine | Admitting: Family Medicine

## 2018-08-10 VITALS — BP 125/77 | HR 82 | Temp 98.3°F | Ht 63.0 in | Wt 162.2 lb

## 2018-08-10 DIAGNOSIS — R0602 Shortness of breath: Secondary | ICD-10-CM | POA: Diagnosis not present

## 2018-08-10 DIAGNOSIS — Z952 Presence of prosthetic heart valve: Secondary | ICD-10-CM

## 2018-08-10 DIAGNOSIS — I4891 Unspecified atrial fibrillation: Secondary | ICD-10-CM | POA: Diagnosis not present

## 2018-08-10 DIAGNOSIS — R5383 Other fatigue: Secondary | ICD-10-CM | POA: Diagnosis not present

## 2018-08-10 DIAGNOSIS — I5022 Chronic systolic (congestive) heart failure: Secondary | ICD-10-CM

## 2018-08-10 DIAGNOSIS — R918 Other nonspecific abnormal finding of lung field: Secondary | ICD-10-CM | POA: Insufficient documentation

## 2018-08-10 DIAGNOSIS — J449 Chronic obstructive pulmonary disease, unspecified: Secondary | ICD-10-CM

## 2018-08-10 DIAGNOSIS — J90 Pleural effusion, not elsewhere classified: Secondary | ICD-10-CM | POA: Insufficient documentation

## 2018-08-10 LAB — COAGUCHEK XS/INR WAIVED
INR: 5.1 — AB (ref 0.9–1.1)
Prothrombin Time: 61.6 s

## 2018-08-10 NOTE — Progress Notes (Signed)
BP 125/77 (BP Location: Right Arm, Patient Position: Sitting, Cuff Size: Normal)   Pulse 82   Temp 98.3 F (36.8 C) (Oral)   Ht 5\' 3"  (1.6 m)   Wt 162 lb 3.2 oz (73.6 kg)   SpO2 95%   BMI 28.73 kg/m    Subjective:    [name], male    DOB: [birth year], 77 y.o.   MRN: [id number]  HPI: Kirk Jones is a 77 y.o. male  Chief Complaint  Patient presents with  . Coagulation Disorder   Here today for 2 week INR f/u as INR was not at goal. Alternating 3.5 mg and 3 mg coumadin. No new medication changes, but has been very inconsistent with his diet recently. Does have some increased bruising on forearms but otherwise no bleeding or bruising issues.   Very concerned with worsening DOE. Wife states now walking to the mailbox even will get him out of breath. Consistent with his spiriva for his COPD. Still taking 40 mg lasix daily for CHF with stable weights and no LE edema or orthopnea. Former smoker, quit in 1992. States he had an x-ray in the past few months at [facility] that showed some fluid, no follow up imaging was discussed.   Past Medical History:  Diagnosis Date  . Aortic valve regurgitation   . Atrial fibrillation ([place])   . Barrett's esophagus   . COPD (chronic obstructive pulmonary disease) ([place])   . Diverticulosis   . Gout   . Headache    aura only  . Hematuria   . History of prosthetic heart valve   . Hyperlipidemia   . Hypertension   . Nodular prostate with urinary obstruction   . Substance abuse ([place])    alcohol   Social History   Socioeconomic History  . Marital status: Married    Spouse name: Not on file  . Number of children: Not on file  . Years of education: Not on file  . Highest education level: Not on file  Occupational History  . Not on file  Social Needs  . Financial resource strain: Not hard at all  . Food insecurity:    Worry: Never true    Inability: Never true  . Transportation needs:    Medical: No    Non-medical:  No  Tobacco Use  . Smoking status: Former Smoker    Types: Cigarettes    Last attempt to quit: 04/24/1991    Years since quitting: 27.3  . Smokeless tobacco: Never Used  Substance and Sexual Activity  . Alcohol use: Yes    Alcohol/week: 24.0 standard drinks    Types: 24 Shots of liquor per week    Comment: quit December 2015/restarted  . Drug use: No  . Sexual activity: Never  Lifestyle  . Physical activity:    Days per week: 0 days    Minutes per session: 0 min  . Stress: Not at all  Relationships  . Social connections:    Talks on phone: Once a week    Gets together: Once a week    Attends religious service: More than 4 times per year    Active member of club or organization: No    Attends meetings of clubs or organizations: Never    Relationship status: Married  . Intimate partner violence:    Fear of current or ex partner: No    Emotionally abused: No    Physically abused: No    Forced sexual activity:  No  Other Topics Concern  . Not on file  Social History Narrative  . Not on file   Relevant past medical, surgical, family and social history reviewed and updated as indicated. Interim medical history since our last visit reviewed. Allergies and medications reviewed and updated.  Review of Systems  Per HPI unless specifically indicated above     Objective:    BP 125/77 (BP Location: Right Arm, Patient Position: Sitting, Cuff Size: Normal)   Pulse 82   Temp 98.3 F (36.8 C) (Oral)   Ht 5\' 3"  (1.6 m)   Wt 162 lb 3.2 oz (73.6 kg)   SpO2 95%   BMI 28.73 kg/m   Wt Readings from Last 3 Encounters:  08/10/18 162 lb 3.2 oz (73.6 kg)  07/27/18 164 lb 1.6 oz (74.4 kg)  07/05/18 162 lb 4.8 oz (73.6 kg)    Physical Exam  Constitutional: He is oriented to person, place, and time. He appears well-developed and well-nourished.  HENT:  Head: Atraumatic.  Eyes: Conjunctivae and EOM are normal.  Neck: Normal range of motion. Neck supple.  Cardiovascular: Normal rate.   Pulmonary/Chest: Effort normal. No respiratory distress.  No obvious crackles or wheezes  Musculoskeletal: Normal range of motion.  Neurological: He is alert and oriented to person, place, and time.  Skin: Skin is warm and dry.  Bruising b/l forearms  Psychiatric: He has a normal mood and affect. His behavior is normal.  Nursing note and vitals reviewed.   Results for orders placed or performed in visit on 08/10/18  CoaguChek XS/INR Waived  Result Value Ref Range   INR 5.1 (HH) 0.9 - 1.1   Prothrombin Time 61.6 sec      Assessment & Plan:   Problem List Items Addressed This Visit      Cardiovascular and Mediastinum   Atrial fibrillation ([place]) - Primary (Chronic)    Rate controlled, decrease coumadin to 3 mg daily and recheck in 2 weeks. Watch closely for bleeding or bruising issues      Relevant Orders   CoaguChek XS/INR Waived (Completed)   Systolic congestive heart failure ([facility])    Weights stable, no LE edema or obvious crackles. Continue current regimen        Respiratory   COPD (chronic obstructive pulmonary disease) ([facility])    Continue spiriva. Cannot perform spirometry today due to lack of equipment. Will repeat CXR as report not available and pt reports abnormal result with worsening sxs since        Other   History of prosthetic heart valve (Chronic)    INR today above goal at 5.1. Reduce coumadin dose to 3 mg daily and recheck in 2 weeks      Relevant Orders   CoaguChek XS/INR Waived (Completed)    Other Visit Diagnoses    SOB (shortness of breath)       Relevant Orders   DG Chest 2 View (Completed)       Follow up plan: Return in about 2 weeks (around 08/24/2018) for INR.

## 2018-08-10 NOTE — Patient Instructions (Signed)
3 mg daily

## 2018-08-11 ENCOUNTER — Telehealth: Payer: Self-pay | Admitting: Family Medicine

## 2018-08-11 DIAGNOSIS — R9389 Abnormal findings on diagnostic imaging of other specified body structures: Secondary | ICD-10-CM

## 2018-08-11 DIAGNOSIS — R0602 Shortness of breath: Secondary | ICD-10-CM

## 2018-08-11 NOTE — Telephone Encounter (Signed)
Message relayed to patient. Verbalized understanding and denied questions.   

## 2018-08-11 NOTE — Telephone Encounter (Signed)
Please let him know that there was a spot on his lung on the x-ray. We don't know what it is yet, but I'd like him to have a CT scan done to look at it. Order in.

## 2018-08-11 NOTE — Telephone Encounter (Signed)
rec'd call from [place] with abnormal chest x-ray results from 9/12.  IMPRESSION: Rounded masslike density projecting at the level of the spine on the lateral view is nonspecific. While this may potentially represent loculated fluid, pulmonary mass is not excluded. Recommend dedicated evaluation with chest CT.  Small right pleural effusion and underlying opacities which may represent atelectasis or infection.  Will notify provider of abnormal results.

## 2018-08-11 NOTE — Telephone Encounter (Signed)
CT ordered. 

## 2018-08-15 NOTE — Assessment & Plan Note (Addendum)
Continue spiriva. Cannot perform spirometry today due to lack of equipment. Will repeat CXR as report not available and pt reports abnormal result with worsening sxs since

## 2018-08-15 NOTE — Assessment & Plan Note (Signed)
Rate controlled, decrease coumadin to 3 mg daily and recheck in 2 weeks. Watch closely for bleeding or bruising issues

## 2018-08-15 NOTE — Assessment & Plan Note (Addendum)
Weights stable, no LE edema or obvious crackles. Continue current regimen

## 2018-08-16 NOTE — Assessment & Plan Note (Signed)
INR today above goal at 5.1. Reduce coumadin dose to 3 mg daily and recheck in 2 weeks

## 2018-08-21 ENCOUNTER — Ambulatory Visit
Admission: RE | Admit: 2018-08-21 | Discharge: 2018-08-21 | Disposition: A | Discharge status: Home / Self Care | Payer: Medicare Other | Source: Ambulatory Visit | Attending: Family Medicine | Admitting: Family Medicine

## 2018-08-21 DIAGNOSIS — R918 Other nonspecific abnormal finding of lung field: Secondary | ICD-10-CM | POA: Diagnosis not present

## 2018-08-21 DIAGNOSIS — J432 Centrilobular emphysema: Secondary | ICD-10-CM | POA: Diagnosis not present

## 2018-08-21 DIAGNOSIS — R9389 Abnormal findings on diagnostic imaging of other specified body structures: Secondary | ICD-10-CM

## 2018-08-21 DIAGNOSIS — R0602 Shortness of breath: Secondary | ICD-10-CM | POA: Diagnosis not present

## 2018-08-21 DIAGNOSIS — R590 Localized enlarged lymph nodes: Secondary | ICD-10-CM | POA: Diagnosis not present

## 2018-08-21 DIAGNOSIS — J90 Pleural effusion, not elsewhere classified: Secondary | ICD-10-CM | POA: Insufficient documentation

## 2018-08-21 DIAGNOSIS — I712 Thoracic aortic aneurysm, without rupture: Secondary | ICD-10-CM | POA: Insufficient documentation

## 2018-08-22 ENCOUNTER — Other Ambulatory Visit: Payer: Self-pay | Admitting: Family Medicine

## 2018-08-22 ENCOUNTER — Encounter: Payer: Self-pay | Admitting: Family Medicine

## 2018-08-22 ENCOUNTER — Ambulatory Visit (INDEPENDENT_AMBULATORY_CARE_PROVIDER_SITE_OTHER): Payer: Medicare Other | Admitting: Family Medicine

## 2018-08-22 VITALS — BP 123/71 | HR 112 | Temp 98.0°F | Ht 63.0 in | Wt 158.0 lb

## 2018-08-22 DIAGNOSIS — I482 Chronic atrial fibrillation, unspecified: Secondary | ICD-10-CM

## 2018-08-22 DIAGNOSIS — Z952 Presence of prosthetic heart valve: Secondary | ICD-10-CM

## 2018-08-22 DIAGNOSIS — R918 Other nonspecific abnormal finding of lung field: Secondary | ICD-10-CM

## 2018-08-22 LAB — COAGUCHEK XS/INR WAIVED
INR: 3.2 — ABNORMAL HIGH (ref 0.9–1.1)
PROTHROMBIN TIME: 38.2 s

## 2018-08-22 NOTE — Assessment & Plan Note (Signed)
INR 3.2- continue current regimen and recheck 2 weeks. Call with any concerns.

## 2018-08-22 NOTE — Patient Instructions (Signed)
[facility] Address: [address] [place],  [address]  Phone: [phone] Friday, 08/25/18 10:45AM Dr. Janese Banks

## 2018-08-22 NOTE — Addendum Note (Signed)
Addended by: Valerie Roys on: 08/22/2018 12:59 PM   Modules accepted: Orders

## 2018-08-22 NOTE — Progress Notes (Signed)
BP 123/71   Pulse (!) 112   Temp 98 F (36.7 C) (Oral)   Ht 5\' 3"  (1.6 m)   Wt 158 lb (71.7 kg)   SpO2 94%   BMI 27.99 kg/m    Subjective:    [name], male    DOB: 1941/03/13, 77 y.o.   MRN: [id number]  HPI: Kirk Jones is a 77 y.o. male  Chief Complaint  Patient presents with  . Coagulation Disorder   Kirk Jones is here today to discuss results of his CT scan of his chest he had yesterday. It showed a 6.6x5.5cm solid appearing mass in the RLL as below.  "IMPRESSION: 1. The area questioned on lateral chest x-ray does represent a solid-appearing mass in the right lower lobe consistent with primary lung carcinoma of 6.6 x 5.6 cm extending toward the right hilum. 2. Minimal mediastinal adenopathy with right infrahilar adenopathy present but poorly visualized on this unenhanced study. 3. Small nodular opacities primarily throughout the right lung are suspicious for metastasis with questionable single lesion noted on the left. 4. The mid ascending thoracic aorta measures 47 mm in diameter. Ascending thoracic aortic aneurysm. Recommend semi-annual imaging followup by CTA or MRA and referral to cardiothoracic surgery if not already obtained. This recommendation follows 2010 ACCF/AHA/AATS/ACR/ASA/SCA/SCAI/SIR/STS/SVM Guidelines for the Diagnosis and Management of Patients With Thoracic Aortic Disease. Circulation. 2010; 121: e266-e369 5. Moderate size right pleural effusion. 6. Centrilobular emphysema."  Kirk Jones has been struggling with SOB for several months now and having more DOE. Had x-ray done 2 weeks ago which showed concern for a mass, and CT done yesterday.   Coumadin Management.  The expected duration of coumadin treatment is lifelong The reason for anticoagulation is  A. Fib.  Present Coumadin dose: 3 mg daily Goal:2.5-3.5   Excessive bruising: no Nose bleeding: no Rectal bleeding: no Prolonged menstrual cycles: N/A Eating  diet with consistent amounts of foods containing Vitamin K:yes Any recent antibiotic use? no   Relevant past medical, surgical, family and social history reviewed and updated as indicated. Interim medical history since our last visit reviewed. Allergies and medications reviewed and updated.  Review of Systems  Constitutional: Negative.   Respiratory: Positive for shortness of breath. Negative for apnea, cough, choking, chest tightness, wheezing and stridor.   Cardiovascular: Negative.   Psychiatric/Behavioral: Negative.     Per HPI unless specifically indicated above     Objective:    BP 123/71   Pulse (!) 112   Temp 98 F (36.7 C) (Oral)   Ht 5\' 3"  (1.6 m)   Wt 158 lb (71.7 kg)   SpO2 94%   BMI 27.99 kg/m   Wt Readings from Last 3 Encounters:  08/22/18 158 lb (71.7 kg)  08/10/18 162 lb 3.2 oz (73.6 kg)  07/27/18 164 lb 1.6 oz (74.4 kg)    Physical Exam  Constitutional: He is oriented to person, place, and time. He appears well-developed and well-nourished. No distress.  HENT:  Head: Normocephalic and atraumatic.  Right Ear: Hearing normal.  Left Ear: Hearing normal.  Nose: Nose normal.  Eyes: Conjunctivae and lids are normal. Right eye exhibits no discharge. Left eye exhibits no discharge. No scleral icterus.  Cardiovascular: Normal rate, regular rhythm, normal heart sounds and intact distal pulses. Exam reveals no gallop and no friction rub.  No murmur heard. Pulmonary/Chest: Effort normal and breath sounds normal. No stridor. No respiratory distress. He has no wheezes. He has no rales. He exhibits  no tenderness.  Musculoskeletal: Normal range of motion.  Neurological: He is alert and oriented to person, place, and time.  Skin: Skin is warm, dry and intact. Capillary refill takes less than 2 seconds. No rash noted. He is not diaphoretic. No erythema. No pallor.  Psychiatric: He has a normal mood and affect. His speech is normal and behavior is normal. Judgment and  thought content normal. Cognition and memory are normal.  Nursing note and vitals reviewed.   Results for orders placed or performed in visit on 08/10/18  CoaguChek XS/INR Waived  Result Value Ref Range   INR 5.1 (HH) 0.9 - 1.1   Prothrombin Time 61.6 sec      Assessment & Plan:   Problem List Items Addressed This Visit      Cardiovascular and Mediastinum   Atrial fibrillation (HCC) (Chronic)    INR 3.2- continue current regimen and recheck 2 weeks. Call with any concerns.       Relevant Orders   CoaguChek XS/INR Waived     Other   History of prosthetic heart valve (Chronic)    INR 3.2- continue current regimen and recheck 2 weeks. Call with any concerns.       Lung mass - Primary    Found on CT yesterday. Discussed results today. Concern for lung cancer. Appointment with oncology made for Friday. They will call with any questions. Continue to monitor.           Follow up plan: Return in about 2 weeks (around 09/05/2018) for INR.

## 2018-08-22 NOTE — Assessment & Plan Note (Signed)
Found on CT yesterday. Discussed results today. Concern for lung cancer. Appointment with oncology made for Friday. They will call with any questions. Continue to monitor.

## 2018-08-23 ENCOUNTER — Telehealth: Payer: Self-pay

## 2018-08-24 ENCOUNTER — Ambulatory Visit: Payer: Medicare Other | Admitting: Family Medicine

## 2018-08-24 ENCOUNTER — Telehealth: Payer: Self-pay

## 2018-08-24 NOTE — Telephone Encounter (Signed)
Left message to inform the patient of a new patient appointment for 9/27 @ 10:45. I asked if the patient could give Dr Janese Banks office to confirm appointment time and date.

## 2018-08-24 NOTE — Telephone Encounter (Signed)
Left message on the patient voice mail to confirm a new patient appointment time and date. The patient has not returned a call yet.

## 2018-08-25 ENCOUNTER — Inpatient Hospital Stay: Payer: Medicare Other | Attending: Oncology | Admitting: Oncology

## 2018-08-25 ENCOUNTER — Encounter: Payer: Self-pay | Admitting: Oncology

## 2018-08-25 ENCOUNTER — Other Ambulatory Visit: Payer: Self-pay

## 2018-08-25 ENCOUNTER — Inpatient Hospital Stay: Payer: Medicare Other

## 2018-08-25 ENCOUNTER — Telehealth: Payer: Self-pay | Admitting: *Deleted

## 2018-08-25 VITALS — BP 134/77 | HR 94 | Resp 18 | Ht 63.0 in | Wt 159.6 lb

## 2018-08-25 DIAGNOSIS — I4891 Unspecified atrial fibrillation: Secondary | ICD-10-CM | POA: Diagnosis not present

## 2018-08-25 DIAGNOSIS — R918 Other nonspecific abnormal finding of lung field: Secondary | ICD-10-CM | POA: Diagnosis not present

## 2018-08-25 DIAGNOSIS — I2510 Atherosclerotic heart disease of native coronary artery without angina pectoris: Secondary | ICD-10-CM | POA: Diagnosis not present

## 2018-08-25 DIAGNOSIS — I5022 Chronic systolic (congestive) heart failure: Secondary | ICD-10-CM

## 2018-08-25 DIAGNOSIS — I130 Hypertensive heart and chronic kidney disease with heart failure and stage 1 through stage 4 chronic kidney disease, or unspecified chronic kidney disease: Secondary | ICD-10-CM | POA: Diagnosis not present

## 2018-08-25 DIAGNOSIS — J449 Chronic obstructive pulmonary disease, unspecified: Secondary | ICD-10-CM | POA: Diagnosis not present

## 2018-08-25 DIAGNOSIS — N183 Chronic kidney disease, stage 3 (moderate): Secondary | ICD-10-CM | POA: Diagnosis not present

## 2018-08-25 LAB — CBC WITH DIFFERENTIAL/PLATELET
BASOS ABS: 0.1 10*3/uL (ref 0–0.1)
Basophils Relative: 1 %
Eosinophils Absolute: 0 10*3/uL (ref 0–0.7)
Eosinophils Relative: 0 %
HEMATOCRIT: 35.4 % — AB (ref 40.0–52.0)
Hemoglobin: 12 g/dL — ABNORMAL LOW (ref 13.0–18.0)
LYMPHS PCT: 6 %
Lymphs Abs: 0.6 10*3/uL — ABNORMAL LOW (ref 1.0–3.6)
MCH: 32.4 pg (ref 26.0–34.0)
MCHC: 33.8 g/dL (ref 32.0–36.0)
MCV: 95.8 fL (ref 80.0–100.0)
MONOS PCT: 11 %
Monocytes Absolute: 1.1 10*3/uL — ABNORMAL HIGH (ref 0.2–1.0)
NEUTROS ABS: 8.1 10*3/uL — AB (ref 1.4–6.5)
Neutrophils Relative %: 82 %
Platelets: 368 10*3/uL (ref 150–440)
RBC: 3.7 MIL/uL — ABNORMAL LOW (ref 4.40–5.90)
RDW: 16.4 % — AB (ref 11.5–14.5)
WBC: 9.8 10*3/uL (ref 3.8–10.6)

## 2018-08-25 LAB — COMPREHENSIVE METABOLIC PANEL
ALBUMIN: 3.6 g/dL (ref 3.5–5.0)
ALT: 11 U/L (ref 0–44)
ANION GAP: 14 (ref 5–15)
AST: 25 U/L (ref 15–41)
Alkaline Phosphatase: 123 U/L (ref 38–126)
BUN: 20 mg/dL (ref 8–23)
CHLORIDE: 90 mmol/L — AB (ref 98–111)
CO2: 27 mmol/L (ref 22–32)
Calcium: 9.5 mg/dL (ref 8.9–10.3)
Creatinine, Ser: 0.97 mg/dL (ref 0.61–1.24)
GFR calc Af Amer: >60 mL/min (ref >60)
GFR calc non Af Amer: >60 mL/min (ref >60)
Glucose, Bld: 108 mg/dL — ABNORMAL HIGH (ref 70–99)
POTASSIUM: 4.3 mmol/L (ref 3.5–5.1)
SODIUM: 131 mmol/L — AB (ref 135–145)
TOTAL PROTEIN: 7.8 g/dL (ref 6.5–8.1)
Total Bilirubin: 1.5 mg/dL — ABNORMAL HIGH (ref 0.3–1.2)

## 2018-08-25 NOTE — Telephone Encounter (Signed)
Call Dr. Saralyn Pilar office to ask for clearance to come off Coumadin for 5 days in order to have a biopsy.  Mardene Celeste told me that she would send the message back to the doctor but we would not get a response until Monday.

## 2018-08-28 ENCOUNTER — Telehealth: Payer: Self-pay | Admitting: *Deleted

## 2018-08-28 ENCOUNTER — Ambulatory Visit: Payer: Medicare Other | Admitting: Family Medicine

## 2018-08-28 NOTE — Progress Notes (Signed)
Hematology/Oncology Consult note [facility] [phone] [phone]  Patient Care Team: Guadalupe Maple, MD as PCP - General (Family Medicine) Murlean Iba, MD (Internal Medicine) Isaias Cowman, MD as Consulting Physician (Cardiology) Christene Lye, MD (General Surgery) Telford Nab, RN as Registered Nurse   Name of the patient: Kirk Jones  [id number]  August 27, 1941    Reason for referral- lung mass   Referring physician- Dr. Jeananne Rama  Date of visit: 08/28/18   History of presenting illness-patient is a 77 year old Caucasian male with a past medical history significant for mechanical aortic valve replacement in 2004.  He also has a history of chronic systolic congestive heart failure, ischemic cardiomyopathy, ventricular tachycardia status post ICD placement.  He has a history of CVA in 2004 as well as hyperlipidemia and chronic kidney disease.  He has had stent placements in the past and follows up with cardiology as well.  He had an episode of ventricular tachycardia with ICD shock in August 2018.  He has been a chronic smoker and states that he started smoking at the age of 42 quit smoking when he was 65.  Patient presented to the ER with symptoms of worsening shortness of breath which led to a chest x-ray.  Chest x-ray showed masslike density at the level of spine.  This was followed by a CT chest without contrast.  CT showed solid-appearing right lower lobe mass 6.6 x 5.6 cm in size extending towards the right hilum.  Minimal mediastinal adenopathy with right infrahilar adenopathy was noted.  Small nodular opacities throughout the right lung suspicious for metastases.  Questionable single lesion noted on the left.  Patient has been referred to [place] for further management  Patient reports that his appetite is fair but he has lost about 10 pounds over the last 3 months.  He does feel short of breath on minimal exertion even when  walking to the bathroom.  He is not currently on any home oxygen.  Denies any pain  ECOG PS- 2  Pain scale- 0   Review of systems- Review of Systems  Constitutional: Positive for malaise/fatigue and weight loss. Negative for chills and fever.  HENT: Negative for congestion, ear discharge and nosebleeds.   Eyes: Negative for blurred vision.  Respiratory: Positive for shortness of breath. Negative for cough, hemoptysis, sputum production and wheezing.   Cardiovascular: Negative for chest pain, palpitations, orthopnea and claudication.  Gastrointestinal: Negative for abdominal pain, blood in stool, constipation, diarrhea, heartburn, melena, nausea and vomiting.  Genitourinary: Negative for dysuria, flank pain, frequency, hematuria and urgency.  Musculoskeletal: Negative for back pain, joint pain and myalgias.  Skin: Negative for rash.  Neurological: Negative for dizziness, tingling, focal weakness, seizures, weakness and headaches.  Endo/Heme/Allergies: Does not bruise/bleed easily.  Psychiatric/Behavioral: Negative for depression and suicidal ideas. The patient does not have insomnia.     Allergies  Allergen Reactions  . Prednisone Other (See Comments)    Reaction: "organs shutting down"    Patient Active Problem List   Diagnosis Date Noted  . Lung mass 08/22/2018  . Allergic rhinitis 02/12/2018  . Advanced care planning/counseling discussion 10/19/2017  . Expressive aphasia 06/13/2017  . Hx of adenomatous colonic polyps 02/18/2017  . Bilateral cataracts 08/18/2016  . CAD (coronary artery disease) 10/06/2015  . Pacemaker 10/06/2015  . Automatic implantable cardioverter-defibrillator in situ 10/06/2015  . Nodular prostate with urinary obstruction 10/06/2015  . Senile purpura ([place]) 10/06/2015  . Barrett's esophagus 05/28/2015  . Hypertension 05/28/2015  .  COPD (chronic obstructive pulmonary disease) ([place]) 05/28/2015  . Hyperlipidemia 05/28/2015  . Diverticulosis 05/28/2015    . CKD (chronic kidney disease), stage III ([place]) 05/28/2015  . Gout 05/28/2015  . History of prosthetic heart valve 05/13/2015  . Atrial fibrillation ([place]) 05/13/2015  . CVA (cerebral vascular accident) ([place]) 07/05/2014  . Systolic congestive heart failure ([place]) 07/05/2014  . History of TIA (transient ischemic attack) 02/12/2013  . Warfarin anticoagulation 02/12/2013     Past Medical History:  Diagnosis Date  . Aortic valve regurgitation   . Atrial fibrillation ([place])   . Barrett's esophagus   . COPD (chronic obstructive pulmonary disease) ([place])   . Diverticulosis   . GERD (gastroesophageal reflux disease)   . Gout   . Headache    aura only  . Hematuria   . History of prosthetic heart valve   . Hyperlipidemia   . Hypertension   . Nodular prostate with urinary obstruction   . Substance abuse ([place])    alcohol     Past Surgical History:  Procedure Laterality Date  . CARDIAC VALVE REPLACEMENT N/A 2004  . CARPAL TUNNEL RELEASE Right 2015  . CATARACT EXTRACTION Right 2017  . COLONOSCOPY WITH PROPOFOL N/A 2014  . CORONARY ANGIOPLASTY     stints x2  . EYE SURGERY     cross eyed  . FOOT SURGERY Bilateral    [place]  . FRACTURE SURGERY Right    [place]  . INGUINAL HERNIA REPAIR Right 09/16/2017   Procedure: HERNIA REPAIR INGUINAL ADULT;  Surgeon: Christene Lye, MD;  Location: [facility] ORS;  Service: General;  Laterality: Right;  . INSERT / REPLACE / [place]     ICD  . PACEMAKER IMPLANT Left 2013   and ICD  . VASECTOMY      Social History   Socioeconomic History  . Marital status: Married    Spouse name: Not on file  . Number of children: Not on file  . Years of education: Not on file  . Highest education level: Not on file  Occupational History  . Not on file  Social Needs  . Financial resource strain: Not hard at all  . Food insecurity:    Worry: Never true    Inability: Never true  . Transportation needs:    Medical: No    Non-medical: No   Tobacco Use  . Smoking status: Former Smoker    Types: Cigarettes    Last attempt to quit: 04/24/1991    Years since quitting: 27.3  . Smokeless tobacco: Never Used  Substance and Sexual Activity  . Alcohol use: Yes    Alcohol/week: 24.0 standard drinks    Types: 24 Shots of liquor per week    Comment: quit December 2015/restarted  . Drug use: No  . Sexual activity: Never  Lifestyle  . Physical activity:    Days per week: 0 days    Minutes per session: 0 min  . Stress: Not at all  Relationships  . Social connections:    Talks on phone: Once a week    Gets together: Once a week    Attends religious service: More than 4 times per year    Active member of club or organization: No    Attends meetings of clubs or organizations: Never    Relationship status: Married  . Intimate partner violence:    Fear of current or ex partner: No    Emotionally abused: No    Physically abused: No  Forced sexual activity: No  Other Topics Concern  . Not on file  Social History Narrative  . Not on file     Family History  Problem Relation Age of Onset  . Heart attack Mother   . Diabetes Maternal Grandmother   . Lung cancer Sister      Current Outpatient Medications:  .  aspirin EC 81 MG tablet, Take 81 mg by mouth daily., Disp: , Rfl:  .  diltiazem (CARDIZEM CD) 180 MG 24 hr capsule, , Disp: , Rfl: 3 .  empagliflozin (JARDIANCE) 10 MG TABS tablet, Take 10 mg by mouth daily., Disp: , Rfl:  .  fexofenadine (ALLEGRA) 180 MG tablet, Take 180 mg by mouth daily., Disp: , Rfl:  .  finasteride (PROSCAR) 5 MG tablet, TAKE 1 TABLET BY MOUTH ONCE DAILY, Disp: 90 tablet, Rfl: 2 .  fluticasone (FLONASE) 50 MCG/ACT nasal spray, Place 2 sprays daily into both nostrils., Disp: 16 g, Rfl: 12 .  furosemide (LASIX) 40 MG tablet, Take 40 mg by mouth daily. , Disp: , Rfl:  .  losartan (COZAAR) 25 MG tablet, Take 1 tablet (25 mg total) by mouth daily., Disp: 90 tablet, Rfl: 4 .  Magnesium 400 MG TABS,  Take 400 mg by mouth daily. , Disp: , Rfl:  .  metoprolol succinate (TOPROL-XL) 50 MG 24 hr tablet, TAKE 1 TABLET BY MOUTH TWICE DAILY WITH MEALS OR IMMEDIATELY FOLLOWING MEAL, Disp: 180 tablet, Rfl: 4 .  Multiple Vitamins-Minerals (CENTRUM ADULTS PO), Take 1 tablet by mouth daily. , Disp: , Rfl:  .  omeprazole (PRILOSEC) 20 MG capsule, Take 1 capsule (20 mg total) by mouth daily., Disp: 90 capsule, Rfl: 4 .  Polyethyl Glycol-Propyl Glycol (SYSTANE OP), Place 1 drop into both eyes as needed (for dry eyes)., Disp: , Rfl:  .  tamsulosin (FLOMAX) 0.4 MG CAPS capsule, Take 2 capsules (0.8 mg total) by mouth daily., Disp: 90 capsule, Rfl: 4 .  warfarin (COUMADIN) 3 MG tablet, TAKE 1 TABLET BY MOUTH ONCE DAILY., Disp: 30 tablet, Rfl: 2 .  acetaminophen (TYLENOL) 650 MG CR tablet, Take 650 mg by mouth 2 (two) times daily., Disp: , Rfl:  .  colchicine 0.6 MG tablet, Take 1 tablet (0.6 mg total) by mouth daily. (Patient not taking: Reported on 08/25/2018), Disp: 30 tablet, Rfl: 1 .  desonide (DESOWEN) 0.05 % cream, Apply topically 2 (two) times daily. (Patient not taking: Reported on 08/25/2018), Disp: 60 g, Rfl: 0 .  diltiazem (DILACOR XR) 180 MG 24 hr capsule, Take 180 mg by mouth daily, Disp: 90 capsule, Rfl: 4 .  ketoconazole (NIZORAL) 2 % shampoo, Apply 1 application topically 2 (two) times a week. , Disp: , Rfl:  .  loratadine (CLARITIN) 10 MG tablet, Take 1 tablet (10 mg total) by mouth daily. (Patient not taking: Reported on 08/25/2018), Disp: 90 tablet, Rfl: 3 .  tiotropium (SPIRIVA) 18 MCG inhalation capsule, Place 1 capsule (18 mcg total) into inhaler and inhale daily. (Patient not taking: Reported on 08/25/2018), Disp: 90 capsule, Rfl: 4 .  UNABLE TO FIND, CBD Hemp topical cream, Disp: , Rfl:  .  warfarin (COUMADIN) 1 MG tablet, TAKE 1 TABLET BY MOUTH ONCE DAILY (Patient not taking: Reported on 08/25/2018), Disp: 30 tablet, Rfl: 12 .  warfarin (COUMADIN) 4 MG tablet, TAKE 1 TABLET BY MOUTH ONCE DAILY  AT 6 P.M. (Patient not taking: Reported on 08/25/2018), Disp: 30 tablet, Rfl: 3   Physical exam:  Vitals:   08/25/18 1108  BP: 134/77  Pulse: 94  Resp: 18  TempSrc: Oral  SpO2: 93%  Weight: 159 lb 9.6 oz (72.4 kg)  Height: 5\' 3"  (1.6 m)   Physical Exam  Constitutional: He is oriented to person, place, and time. He appears well-developed and well-nourished.  Elderly gentleman in no acute distress  HENT:  Head: Normocephalic and atraumatic.  Eyes: Pupils are equal, round, and reactive to light. EOM are normal.  Neck: Normal range of motion.  Cardiovascular: Normal rate.  Irregular. Prosthetic aortic valve sound  Pulmonary/Chest: Effort normal and breath sounds normal.  Abdominal: Soft. Bowel sounds are normal.  Musculoskeletal: He exhibits no edema.  Neurological: He is alert and oriented to person, place, and time.  Skin: Skin is warm and dry.       CMP Latest Ref Rng & Units 08/25/2018  Glucose 70 - 99 mg/dL 108(H)  BUN 8 - 23 mg/dL 20  Creatinine 0.61 - 1.24 mg/dL 0.97  Sodium 135 - 145 mmol/L 131(L)  Potassium 3.5 - 5.1 mmol/L 4.3  Chloride 98 - 111 mmol/L 90(L)  CO2 22 - 32 mmol/L 27  Calcium 8.9 - 10.3 mg/dL 9.5  Total Protein 6.5 - 8.1 g/dL 7.8  Total Bilirubin 0.3 - 1.2 mg/dL 1.5(H)  Alkaline Phos 38 - 126 U/L 123  AST 15 - 41 U/L 25  ALT 0 - 44 U/L 11   CBC Latest Ref Rng & Units 08/25/2018  WBC 3.8 - 10.6 K/uL 9.8  Hemoglobin 13.0 - 18.0 g/dL 12.0(L)  Hematocrit 40.0 - 52.0 % 35.4(L)  Platelets 150 - 440 K/uL 368    No images are attached to the encounter.  Dg Chest 2 View  Result Date: 08/11/2018 CLINICAL DATA:  Patient with worsening shortness of breath and fatigue EXAM: CHEST - 2 VIEW COMPARISON:  Chest radiograph 11/16/2014 FINDINGS: Single lead AICD device overlies the left hemithorax, leads stable in position. Stable cardiomegaly status post median sternotomy. Small right pleural effusion and heterogeneous opacities right lung base. No  pneumothorax. On the lateral view projecting just anterior to the thoracic spine is a rounded masslike density. IMPRESSION: Rounded masslike density projecting at the level of the spine on the lateral view is nonspecific. While this may potentially represent loculated fluid, pulmonary mass is not excluded. Recommend dedicated evaluation with chest CT. Small right pleural effusion and underlying opacities which may represent atelectasis or infection. These results will be called to the ordering clinician or representative by the Radiologist Assistant, and communication documented in the PACS or zVision Dashboard. Electronically Signed   By: Lovey Newcomer M.D.   On: 08/11/2018 08:22   Ct Chest Wo Contrast  Result Date: 08/22/2018 CLINICAL DATA:  Abnormal chest x-ray with rounded masslike opacity overlying the spine on the lateral view EXAM: CT CHEST WITHOUT CONTRAST TECHNIQUE: Multidetector CT imaging of the chest was performed following the standard protocol without IV contrast. COMPARISON:  Chest x-ray of 08/10/2018 FINDINGS: Cardiovascular: There is moderately severe thoracic aortic atherosclerosis present. Mid ascending thoracic aorta measures 47 mm in diameter. There is also aneurysmal dilatation of the proximal thoracic aortic arch measuring up to 49 mm in diameter. The descending thoracic aorta is normal in caliber also manifesting moderately severe thoracic aortic atherosclerosis. There is considerable calcification of the aortic valve and there are diffuse coronary artery calcifications in this patient who has previously undergone CABG. Cardiomegaly is noted and no pericardial effusion is seen. Mediastinum/Nodes: There are multiple borderline mediastinal lymph nodes. Hilar nodes are difficult to assess  on this unenhanced study. However there is suspicion of enlarged right infrahilar nodes as well. Rounded calcification is noted within the right thyroid gland with the left thyroid gland being small. No  definite hiatal hernia is seen. Lungs/Pleura: There is a moderately large right pleural effusion present. As questioned on chest x-ray there is a rounded solid-appearing mass in the right lower lobe laterally measuring 6.6 x 5.6 cm with attenuation of 35 HU, consistent with primary lung carcinoma. This lesion does appear to extend toward the right infrahilar region. Also, on lung window images there are several small nodular lesions scattered throughout the lungs right lung, suspicious for metastases in view of the right lower lobe mass. Also there are changes of centrilobular emphysema noted. The bronchi to the right middle lobe and right lower lobe are narrowed by the apparent right infrahilar adenopathy. On the right there is a question of a vague nodule better seen on the sagittal view on image number 129 series 7, and a contralateral metastasis cannot be excluded. Upper Abdomen: The liver somewhat small. No definite metastatic involvement is seen. Contours of the liver may be slightly nodular and correlation with liver function tests is recommended. Musculoskeletal: The thoracic vertebrae are in normal alignment with degenerative change in the lower thoracic and upper lumbar spine. No lytic or blastic lesion is seen. IMPRESSION: 1. The area questioned on lateral chest x-ray does represent a solid-appearing mass in the right lower lobe consistent with primary lung carcinoma of 6.6 x 5.6 cm extending toward the right hilum. 2. Minimal mediastinal adenopathy with right infrahilar adenopathy present but poorly visualized on this unenhanced study. 3. Small nodular opacities primarily throughout the right lung are suspicious for metastasis with questionable single lesion noted on the left. 4. The mid ascending thoracic aorta measures 47 mm in diameter. Ascending thoracic aortic aneurysm. Recommend semi-annual imaging followup by CTA or MRA and referral to cardiothoracic surgery if not already obtained. This  recommendation follows 2010 ACCF/AHA/AATS/ACR/ASA/SCA/SCAI/SIR/STS/SVM Guidelines for the Diagnosis and Management of Patients With Thoracic Aortic Disease. Circulation. 2010; 121: e266-e369 5. Moderate size right pleural effusion. 6. Centrilobular emphysema. Electronically Signed   By: Ivar Drape M.D.   On: 08/22/2018 08:21    Assessment and plan- Patient is a 77 y.o. male referred for right lower lobe lung mass  I have reviewed CT chest images independently and discussed findings with the patient.  He was found to have a large right lower lobe lung mass.  There are also questionable nodule seen in the right lung and possibly his left lung along with mediastinal adenopathy.  This is highly suspicious for lung cancer.  I also spoke to interventional radiology if the right lower lobe lung mass would be accessible by CT-guided biopsy.  He has multiple cardiovascular comorbidities bronchoscopy may be higher risk as compared to CT-guided biopsy.  I think that the would be able to obtain a CT-guided lung biopsy which we will schedule after obtaining a PET CT scan to complete his staging work-up.  He will need to hold his Coumadin prior to his biopsy and this will be coordinated by cardiology.  I will hold off on port placement at this time until we have the results of the biopsy.  Patient states that he cannot undergo MRI brain because of his aortic valve which we will clarify with cardiology as well.  If he is unable to undergo MRI will proceed with CT brain with and without contrast  I will tentatively see him  back in 2 weeks time to discuss the results of the PET scan and biopsy and further management  Thank you for this kind referral and the opportunity to participate in the care of this patient   Visit Diagnosis 1. Lung mass   2. Abnormal CT scan, lung     Dr. Randa Evens, MD, MPH [facility] [phone] 08/28/2018  11:33 AM

## 2018-08-28 NOTE — Telephone Encounter (Signed)
Called office and left message through [place] that pt has mitral valve replacement, pacemaker and stent and want to know if it is ok to do MRI for pt. And f/u on note about coumadin for pt to have bx

## 2018-08-29 ENCOUNTER — Other Ambulatory Visit: Payer: Self-pay | Admitting: *Deleted

## 2018-08-29 DIAGNOSIS — R918 Other nonspecific abnormal finding of lung field: Secondary | ICD-10-CM

## 2018-08-30 ENCOUNTER — Telehealth: Payer: Self-pay | Admitting: *Deleted

## 2018-08-30 ENCOUNTER — Encounter
Admission: RE | Admit: 2018-08-30 | Discharge: 2018-08-30 | Disposition: A | Discharge status: Home / Self Care | Payer: Medicare Other | Source: Ambulatory Visit | Attending: Oncology | Admitting: Oncology

## 2018-08-30 DIAGNOSIS — J90 Pleural effusion, not elsewhere classified: Secondary | ICD-10-CM | POA: Diagnosis not present

## 2018-08-30 DIAGNOSIS — R918 Other nonspecific abnormal finding of lung field: Secondary | ICD-10-CM | POA: Insufficient documentation

## 2018-08-30 LAB — GLUCOSE, CAPILLARY: Glucose-Capillary: 89 mg/dL (ref 70–99)

## 2018-08-30 MED ORDER — FLUDEOXYGLUCOSE F - 18 (FDG) INJECTION
8.2000 | Freq: Once | INTRAVENOUS | Status: AC | PRN
  Administered 2018-08-30: 8.8 via INTRAVENOUS

## 2018-08-30 NOTE — Telephone Encounter (Signed)
Called patient to ask him how he did with his PET scan today.  Patient said everything went okay.  I told patient that I was waiting to call because I do not have a biopsy date at this time.  I have asked radiology and they told me they will not make a biopsy decision until they see the PET scan results.  I plan on calling them tomorrow morning and asking if the ready to make a decision since he had his PET done today.  I have talked with Dr. Hendricks Limes and got permission for patient to come off of Coumadin for the biopsy.  I do not have a date yet until I know the date of biopsy and I will contact patient about stopping his Coumadin when needed for the biopsy.  Also went over with him at [facility] who says he cannot have an MRI due to his icemaker as well as his prosthetic valve.  Therefore he is going to have a CT of his head.  I have got it scheduled for [place] on Monday, October 7.  Arrive at the [facility] at 1045.  And just liquids 4 hours prior to the exam.  Patient is agreeable and got his wife to write down the dates and he already saw the information on my chart too.  Will call back to patient when I have the biopsy date and patient is agreeable to this plan

## 2018-09-01 ENCOUNTER — Telehealth: Payer: Self-pay | Admitting: *Deleted

## 2018-09-01 NOTE — Telephone Encounter (Signed)
Called patient to let them know that I spoke with Dr. Janese Banks and we have changed his appointment from 10 /10 to 10/ 14 he does realize that he will be going to [place].  He is agreeable to the 1045 slot.  I also when I called him before I did tell him that today's the last day of his Coumadin or warfarin until after he has his biopsy next Wednesday then he would restart and patient was agreeable to that plan.

## 2018-09-01 NOTE — Telephone Encounter (Signed)
Called patient to let him know about his biopsy date of October 9 [facility] mall at 11 AM for 12 noon biopsy.  Nothing to eat or drink for 6 to 8 hours prior.  Have a driver be able to drive you back home.  Average time of being in the hospital is 2 to 4 hours.  Any meds with a sip of water.  Patient asked about whether or not he needs to come for his appointment on October 10.  I told him I would leave a message with Dr. Janese Banks and ask because his pathology will probably not be back by the next day.  Asked patient if it is okay if I call him back with the response.  Patient is agreeable to this and understands all instructions about his biopsy

## 2018-09-04 ENCOUNTER — Ambulatory Visit
Admission: RE | Admit: 2018-09-04 | Discharge: 2018-09-04 | Disposition: A | Discharge status: Home / Self Care | Payer: Medicare Other | Source: Ambulatory Visit | Attending: Oncology | Admitting: Oncology

## 2018-09-04 DIAGNOSIS — G9389 Other specified disorders of brain: Secondary | ICD-10-CM | POA: Diagnosis not present

## 2018-09-04 DIAGNOSIS — G319 Degenerative disease of nervous system, unspecified: Secondary | ICD-10-CM | POA: Diagnosis not present

## 2018-09-04 DIAGNOSIS — I6782 Cerebral ischemia: Secondary | ICD-10-CM | POA: Diagnosis not present

## 2018-09-04 DIAGNOSIS — R918 Other nonspecific abnormal finding of lung field: Secondary | ICD-10-CM | POA: Insufficient documentation

## 2018-09-04 HISTORY — DX: Malignant (primary) neoplasm, unspecified: C80.1

## 2018-09-04 HISTORY — DX: Heart failure, unspecified: I50.9

## 2018-09-04 MED ORDER — IOHEXOL 300 MG/ML  SOLN
75.0000 mL | Freq: Once | INTRAMUSCULAR | Status: AC | PRN
  Administered 2018-09-04: 75 mL via INTRAVENOUS

## 2018-09-05 ENCOUNTER — Encounter: Payer: Self-pay | Admitting: Family Medicine

## 2018-09-05 ENCOUNTER — Other Ambulatory Visit: Payer: Self-pay | Admitting: Radiology

## 2018-09-05 ENCOUNTER — Ambulatory Visit: Payer: Medicare Other | Admitting: Family Medicine

## 2018-09-05 ENCOUNTER — Ambulatory Visit (INDEPENDENT_AMBULATORY_CARE_PROVIDER_SITE_OTHER): Payer: Medicare Other | Admitting: Family Medicine

## 2018-09-05 VITALS — BP 118/70 | HR 94 | Temp 98.9°F | Ht 63.0 in | Wt 160.4 lb

## 2018-09-05 DIAGNOSIS — Z952 Presence of prosthetic heart valve: Secondary | ICD-10-CM | POA: Diagnosis not present

## 2018-09-05 DIAGNOSIS — I482 Chronic atrial fibrillation, unspecified: Secondary | ICD-10-CM | POA: Diagnosis not present

## 2018-09-05 DIAGNOSIS — Z23 Encounter for immunization: Secondary | ICD-10-CM | POA: Diagnosis not present

## 2018-09-05 LAB — COAGUCHEK XS/INR WAIVED
INR: 2.6 — ABNORMAL HIGH (ref 0.9–1.1)
Prothrombin Time: 31.1 s

## 2018-09-05 MED ORDER — KETOCONAZOLE 2 % EX SHAM: 1.0000 "application " | MEDICATED_SHAMPOO | CUTANEOUS | 2 refills | Status: DC

## 2018-09-05 NOTE — Progress Notes (Signed)
   BP 118/70 (BP Location: Right Arm, Patient Position: Sitting, Cuff Size: Normal)   Pulse 94   Temp 98.9 F (37.2 C) (Oral)   Ht 5\' 3"  (1.6 m)   Wt 160 lb 6.4 oz (72.8 kg)   SpO2 96%   BMI 28.41 kg/m    Subjective:    [name], male    DOB: 09/18/1941, 77 y.o.   MRN: [id number]  HPI: Kirk Jones is a 77 y.o. male  Chief Complaint  Patient presents with  . Coagulation Disorder   Here today for INR f/u. Has been off warfarin since Friday for a biopsy tomorrow, is to restart 2 days after procedure. No bleeding or bruising issues other than his usual forearm bruising. Denies CP, palpitations, SOB. Was taking 3 mg daily prior to hold.   Relevant past medical, surgical, family and social history reviewed and updated as indicated. Interim medical history since our last visit reviewed. Allergies and medications reviewed and updated.  Review of Systems  Per HPI unless specifically indicated above     Objective:    BP 118/70 (BP Location: Right Arm, Patient Position: Sitting, Cuff Size: Normal)   Pulse 94   Temp 98.9 F (37.2 C) (Oral)   Ht 5\' 3"  (1.6 m)   Wt 160 lb 6.4 oz (72.8 kg)   SpO2 96%   BMI 28.41 kg/m   Wt Readings from Last 3 Encounters:  09/05/18 160 lb 6.4 oz (72.8 kg)  08/25/18 159 lb 9.6 oz (72.4 kg)  08/22/18 158 lb (71.7 kg)    Physical Exam  Constitutional: He is oriented to person, place, and time. He appears well-developed and well-nourished.  HENT:  Head: Atraumatic.  Eyes: Conjunctivae and EOM are normal.  Neck: Normal range of motion. Neck supple.  Cardiovascular: Normal rate.  Pulmonary/Chest: Effort normal and breath sounds normal.  Musculoskeletal: Normal range of motion.  Neurological: He is alert and oriented to person, place, and time.  Skin: Skin is warm and dry.  Psychiatric: He has a normal mood and affect. His behavior is normal.  Nursing note and vitals reviewed.   Results for orders placed or  performed in visit on 09/05/18  CoaguChek XS/INR Waived  Result Value Ref Range   INR 2.6 (H) 0.9 - 1.1   Prothrombin Time 31.1 sec      Assessment & Plan:   Problem List Items Addressed This Visit      Cardiovascular and Mediastinum   Atrial fibrillation ([place]) - Primary (Chronic)    INR stable today at 2.6. Holding coumadin for a procedure currently. Will recheck 1 week after restart if not being rechecked by provider performing procedure. Will restart at 3 mg daily when appropriate      Relevant Orders   CoaguChek XS/INR Waived (Completed)     Other   History of prosthetic heart valve (Chronic)       Follow up plan: Return in about 4 weeks (around 10/03/2018) for INR, possibly 1 week after restart as well.

## 2018-09-06 ENCOUNTER — Ambulatory Visit: Payer: Medicare Other

## 2018-09-06 ENCOUNTER — Ambulatory Visit: Admission: RE | Admit: 2018-09-06 | Payer: Medicare Other | Source: Ambulatory Visit

## 2018-09-06 DIAGNOSIS — C3431 Malignant neoplasm of lower lobe, right bronchus or lung: Secondary | ICD-10-CM | POA: Diagnosis not present

## 2018-09-06 DIAGNOSIS — K8689 Other specified diseases of pancreas: Secondary | ICD-10-CM | POA: Diagnosis not present

## 2018-09-06 DIAGNOSIS — R918 Other nonspecific abnormal finding of lung field: Secondary | ICD-10-CM | POA: Diagnosis present

## 2018-09-06 DIAGNOSIS — Z5111 Encounter for antineoplastic chemotherapy: Secondary | ICD-10-CM | POA: Insufficient documentation

## 2018-09-06 DIAGNOSIS — I5022 Chronic systolic (congestive) heart failure: Secondary | ICD-10-CM | POA: Diagnosis not present

## 2018-09-06 DIAGNOSIS — I712 Thoracic aortic aneurysm, without rupture: Secondary | ICD-10-CM | POA: Insufficient documentation

## 2018-09-06 DIAGNOSIS — I472 Ventricular tachycardia: Secondary | ICD-10-CM | POA: Insufficient documentation

## 2018-09-06 DIAGNOSIS — I130 Hypertensive heart and chronic kidney disease with heart failure and stage 1 through stage 4 chronic kidney disease, or unspecified chronic kidney disease: Secondary | ICD-10-CM | POA: Diagnosis not present

## 2018-09-06 DIAGNOSIS — J449 Chronic obstructive pulmonary disease, unspecified: Secondary | ICD-10-CM | POA: Diagnosis not present

## 2018-09-06 DIAGNOSIS — N189 Chronic kidney disease, unspecified: Secondary | ICD-10-CM | POA: Diagnosis not present

## 2018-09-06 DIAGNOSIS — I255 Ischemic cardiomyopathy: Secondary | ICD-10-CM | POA: Diagnosis not present

## 2018-09-06 HISTORY — DX: Presence of automatic (implantable) cardiac defibrillator: Z95.810

## 2018-09-06 HISTORY — DX: Cardiac murmur, unspecified: R01.1

## 2018-09-06 HISTORY — DX: Presence of cardiac pacemaker: Z95.0

## 2018-09-06 HISTORY — DX: Unspecified abdominal hernia without obstruction or gangrene: K46.9

## 2018-09-06 LAB — CBC
HCT: 33.7 % — ABNORMAL LOW (ref 39.0–52.0)
HEMOGLOBIN: 11.4 g/dL — AB (ref 13.0–17.0)
MCH: 31.7 pg (ref 26.0–34.0)
MCHC: 33.8 g/dL (ref 30.0–36.0)
MCV: 93.6 fL (ref 80.0–100.0)
Platelets: 373 10*3/uL (ref 150–400)
RBC: 3.6 MIL/uL — ABNORMAL LOW (ref 4.22–5.81)
RDW: 15 % (ref 11.5–15.5)
WBC: 9.5 10*3/uL (ref 4.0–10.5)
nRBC: 0 % (ref 0.0–0.2)

## 2018-09-06 LAB — PROTIME-INR
INR: 1.93
Prothrombin Time: 21.9 seconds — ABNORMAL HIGH (ref 11.4–15.2)

## 2018-09-06 LAB — APTT: aPTT: 40 seconds — ABNORMAL HIGH (ref 24–36)

## 2018-09-06 MED ORDER — SODIUM CHLORIDE 0.9 % IV SOLN: INTRAVENOUS | Status: AC

## 2018-09-06 NOTE — Patient Instructions (Signed)
Follow up in 1 week and 1 month

## 2018-09-06 NOTE — Assessment & Plan Note (Signed)
INR stable today at 2.6. Holding coumadin for a procedure currently. Will recheck 1 week after restart if not being rechecked by provider performing procedure. Will restart at 3 mg daily when appropriate

## 2018-09-07 ENCOUNTER — Other Ambulatory Visit: Payer: Self-pay | Admitting: Student

## 2018-09-07 ENCOUNTER — Ambulatory Visit: Payer: Medicare Other | Admitting: Oncology

## 2018-09-07 ENCOUNTER — Inpatient Hospital Stay: Payer: Medicare Other | Attending: Oncology

## 2018-09-07 ENCOUNTER — Ambulatory Visit: Payer: Medicare Other

## 2018-09-08 ENCOUNTER — Other Ambulatory Visit: Payer: Self-pay | Admitting: Oncology

## 2018-09-08 ENCOUNTER — Other Ambulatory Visit: Payer: Self-pay | Admitting: Radiology

## 2018-09-08 ENCOUNTER — Telehealth: Payer: Self-pay | Admitting: *Deleted

## 2018-09-08 ENCOUNTER — Other Ambulatory Visit (HOSPITAL_COMMUNITY): Payer: Self-pay | Admitting: Interventional Radiology

## 2018-09-08 ENCOUNTER — Ambulatory Visit
Admission: RE | Admit: 2018-09-08 | Discharge: 2018-09-08 | Disposition: A | Discharge status: Home / Self Care | Payer: Medicare Other | Source: Ambulatory Visit | Attending: Oncology | Admitting: Oncology

## 2018-09-08 DIAGNOSIS — R918 Other nonspecific abnormal finding of lung field: Secondary | ICD-10-CM

## 2018-09-08 DIAGNOSIS — C3431 Malignant neoplasm of lower lobe, right bronchus or lung: Secondary | ICD-10-CM | POA: Diagnosis not present

## 2018-09-08 DIAGNOSIS — R911 Solitary pulmonary nodule: Secondary | ICD-10-CM | POA: Diagnosis not present

## 2018-09-08 DIAGNOSIS — J90 Pleural effusion, not elsewhere classified: Secondary | ICD-10-CM | POA: Diagnosis not present

## 2018-09-08 LAB — CBC
HEMATOCRIT: 35.2 % — AB (ref 39.0–52.0)
HEMOGLOBIN: 11.7 g/dL — AB (ref 13.0–17.0)
MCH: 31.7 pg (ref 26.0–34.0)
MCHC: 33.2 g/dL (ref 30.0–36.0)
MCV: 95.4 fL (ref 80.0–100.0)
Platelets: 361 10*3/uL (ref 150–400)
RBC: 3.69 MIL/uL — AB (ref 4.22–5.81)
RDW: 15.3 % (ref 11.5–15.5)
WBC: 12 10*3/uL — ABNORMAL HIGH (ref 4.0–10.5)
nRBC: 0 % (ref 0.0–0.2)

## 2018-09-08 LAB — PROTIME-INR
INR: 1.32
PROTHROMBIN TIME: 16.3 s — AB (ref 11.4–15.2)

## 2018-09-08 LAB — APTT: APTT: 33 s (ref 24–36)

## 2018-09-08 MED ORDER — SODIUM CHLORIDE 0.9 % IV SOLN
INTRAVENOUS | Status: DC
  Administered 2018-09-08: 10:00:00 via INTRAVENOUS

## 2018-09-08 MED ORDER — FENTANYL CITRATE (PF) 100 MCG/2ML IJ SOLN
INTRAMUSCULAR | Status: AC | PRN
  Administered 2018-09-08: 25 ug via INTRAVENOUS
  Administered 2018-09-08: 50 ug via INTRAVENOUS

## 2018-09-08 MED ORDER — HYDROCODONE-ACETAMINOPHEN 5-325 MG PO TABS: 1.0000 | ORAL_TABLET | ORAL | Status: DC | PRN

## 2018-09-08 MED ORDER — MIDAZOLAM HCL 5 MG/5ML IJ SOLN
INTRAMUSCULAR | Status: AC
  Filled 2018-09-08: qty 5

## 2018-09-08 MED ORDER — MIDAZOLAM HCL 5 MG/5ML IJ SOLN
INTRAMUSCULAR | Status: AC | PRN
  Administered 2018-09-08: 0.5 mg via INTRAVENOUS
  Administered 2018-09-08: 1 mg via INTRAVENOUS
  Administered 2018-09-08: 0.5 mg via INTRAVENOUS

## 2018-09-08 MED ORDER — FENTANYL CITRATE (PF) 100 MCG/2ML IJ SOLN
INTRAMUSCULAR | Status: AC
  Filled 2018-09-08: qty 4

## 2018-09-08 MED ORDER — LIDOCAINE HCL (PF) 1 % IJ SOLN
INTRAMUSCULAR | Status: AC | PRN
  Administered 2018-09-08: 8 mL

## 2018-09-08 NOTE — Procedures (Signed)
  Procedure: CT R paracentesis and core lung lesion biopsy 18g x4 EBL:   minimal Complications:  none immediate  See full dictation in [facility].  Dillard Cannon MD Main # [phone] Pager  [phone]

## 2018-09-08 NOTE — Addendum Note (Signed)
Addended by: Sandria Manly on: 09/08/2018 10:40 AM   Modules accepted: Orders

## 2018-09-08 NOTE — Telephone Encounter (Signed)
Called pt and left a message that I know he has spoke to robin and his appt. Has been changed a couple of times. Dr. Janese Banks feels like there may be a need to have possible radiation and so in order to see both providers we had to change the appt for 10/17 9 am and 9:30 to see Dr. Baruch Gouty. Asked him to call if he has a problem

## 2018-09-11 ENCOUNTER — Ambulatory Visit: Payer: Medicare Other | Admitting: Oncology

## 2018-09-11 LAB — CYTOLOGY - NON PAP

## 2018-09-11 LAB — SURGICAL PATHOLOGY

## 2018-09-12 NOTE — Progress Notes (Signed)
Patient for biopsy , Diagnosis TBD

## 2018-09-13 ENCOUNTER — Other Ambulatory Visit: Payer: Self-pay | Admitting: Family Medicine

## 2018-09-13 NOTE — Telephone Encounter (Signed)
Requested medication (s) are due for refill today -overdue  Requested medication (s) are on the active medication list -yes  Future visit scheduled yes  Last refill: patient reported not taking 09/06/18  Notes to clinic: failed lab protocol, reported not taking at last visit- for provider review  Requested Prescriptions  Pending Prescriptions Disp Refills   COLCRYS 0.6 MG tablet [Pharmacy Med Name: COLCRYS 0.6 MG TAB] 30 tablet 1    Sig: TAKE 1 TABLET BY [place]     Endocrinology:  Gout Agents Failed - 09/13/2018 10:31 AM      Failed - Uric Acid in normal range and within 360 days    Uric Acid  Date Value Ref Range Status  03/09/2017 5.9 3.7 - 8.6 mg/dL Final    Comment:               Therapeutic target for gout patients: <6.0         Passed - Cr in normal range and within 360 days    Creatinine  Date Value Ref Range Status  11/18/2014 1.35 (H) 0.60 - 1.30 mg/dL Final   Creatinine, Ser  Date Value Ref Range Status  08/25/2018 0.97 0.61 - 1.24 mg/dL Final         Passed - Valid encounter within last 12 months    Recent Outpatient Visits          1 week ago Chronic atrial fibrillation   [facility] American, [place]   3 weeks ago Lung mass   [place], [place], DO   1 month ago Atrial fibrillation, unspecified type [facility])   [place], [place], [place]   1 month ago Atrial fibrillation, unspecified type [facility])   [facility], [place]   2 months ago Atrial fibrillation, unspecified type [facility])   [facility] [facility], PA-C      Future Appointments            In 1 month [place], Jeannette How, MD [place], [facility]   In 1 month  [place], [place]   In 1 month Crissman, Jeannette How, MD [place], [facility]            Requested Prescriptions  Pending Prescriptions Disp Refills   COLCRYS 0.6 MG tablet  [Pharmacy Med Name: COLCRYS 0.6 MG TAB] 30 tablet 1    Sig: TAKE 1 TABLET BY MOUTH ONCE DAILY     Endocrinology:  Gout Agents Failed - 09/13/2018 10:31 AM      Failed - Uric Acid in normal range and within 360 days    Uric Acid  Date Value Ref Range Status  03/09/2017 5.9 3.7 - 8.6 mg/dL Final    Comment:               Therapeutic target for gout patients: <6.0         Passed - Cr in normal range and within 360 days    Creatinine  Date Value Ref Range Status  11/18/2014 1.35 (H) 0.60 - 1.30 mg/dL Final   Creatinine, Ser  Date Value Ref Range Status  08/25/2018 0.97 0.61 - 1.24 mg/dL Final         Passed - Valid encounter within last 12 months    Recent Outpatient Visits          1 week ago Chronic atrial fibrillation   [facility] American,  PA-C   3 weeks ago Lung mass   [place], DO   1 month ago Atrial fibrillation, unspecified type [facility])   [facility] Vol[facility] American, [place]   1 month ago Atrial fibrillation, unspecified type [facility])   [place], [place], [place]   2 months ago Atrial fibrillation, unspecified type [facility])   [facility] Volney American, [place]      Future Appointments            In 1 month Crissman, Jeannette How, MD [place], [facility]   In 1 month  [place], [place]   In 1 month Crissman, Jeannette How, MD [facility], [facility]

## 2018-09-14 ENCOUNTER — Ambulatory Visit
Admission: RE | Admit: 2018-09-14 | Discharge: 2018-09-14 | Disposition: A | Discharge status: Home / Self Care | Payer: Medicare Other | Source: Ambulatory Visit | Attending: Radiation Oncology | Admitting: Radiation Oncology

## 2018-09-14 ENCOUNTER — Inpatient Hospital Stay (HOSPITAL_BASED_OUTPATIENT_CLINIC_OR_DEPARTMENT_OTHER): Payer: Medicare Other | Admitting: Oncology

## 2018-09-14 ENCOUNTER — Encounter: Payer: Self-pay | Admitting: *Deleted

## 2018-09-14 ENCOUNTER — Encounter: Payer: Self-pay | Admitting: Oncology

## 2018-09-14 VITALS — BP 128/85 | HR 68 | Temp 98.1°F | Resp 18 | Ht 63.0 in | Wt 167.0 lb

## 2018-09-14 DIAGNOSIS — J449 Chronic obstructive pulmonary disease, unspecified: Secondary | ICD-10-CM

## 2018-09-14 DIAGNOSIS — I5022 Chronic systolic (congestive) heart failure: Secondary | ICD-10-CM | POA: Diagnosis not present

## 2018-09-14 DIAGNOSIS — I712 Thoracic aortic aneurysm, without rupture: Secondary | ICD-10-CM | POA: Diagnosis not present

## 2018-09-14 DIAGNOSIS — C3431 Malignant neoplasm of lower lobe, right bronchus or lung: Secondary | ICD-10-CM

## 2018-09-14 DIAGNOSIS — K8689 Other specified diseases of pancreas: Secondary | ICD-10-CM

## 2018-09-14 DIAGNOSIS — Z7189 Other specified counseling: Secondary | ICD-10-CM

## 2018-09-14 DIAGNOSIS — C3491 Malignant neoplasm of unspecified part of right bronchus or lung: Secondary | ICD-10-CM

## 2018-09-14 DIAGNOSIS — Z5111 Encounter for antineoplastic chemotherapy: Secondary | ICD-10-CM | POA: Diagnosis not present

## 2018-09-14 NOTE — Consult Note (Signed)
NEW PATIENT EVALUATION  Name: Kirk Jones  MRN: [id number]  Date:   09/14/2018     DOB: 02/05/1941   This 77 y.o. 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  REFERRING PHYSICIAN: Guadalupe Maple, MD  CHIEF COMPLAINT: No chief complaint on file.   DIAGNOSIS: There were no encounter diagnoses.   PREVIOUS INVESTIGATIONS:  PET CT and CT scans reviewed Pathology report reviewed Clinical notes reviewed  HPI: patient is a 77 year old malewith multiple medical: Comorbidities including aortic valve replacement 2004 ischemic cardiomyopathy ventricular tachycardia does have a pacemaker placed in his left anterior chest. He also a CVA in 2004. He has greater than 50 pack year smoking history presented to the emergency room with symptoms of worsening shortness of breath and chest x-ray showed amass in the right lower lobe. Tumor measuring approximate 6.6 x 5.6 cm on CT scan extending towards the right hilum. PET CT scan confirmed hypermetabolic activity in the right lower lobe mass as well as mediastinal adenopathy. Small nodular opacities throughout the right lung suspicious for metastatic disease. He had a CT-guided biopsy of the right lower lobe mass positive for squamous cell carcinoma with extensive necrosis.PET CT scan also demonstrated hypermetabolic activity in subcarinal lymph nodewith SUV of 3.7. Patient also does a a moderate right pleural effusion with low-grade metabolic activity suggestive of exudatenot specific for malignancy. No hypermetabolic pleural nodules were observed.Incidentally does have a 4.8 cm ascending aortic aneurysm. He is seen today for radiation oncology opinion. He is doing fairly well he states his weight is down about 10 pounds and he is eating less. He specifically denies cough hemoptysis or chest tightness.  PLANNED TREATMENT REGIMEN: split course radiation  therapy with concurrent chemotherapy  PAST MEDICAL HISTORY:  has a past medical history of AICD (automatic cardioverter/defibrillator) present, Aortic valve regurgitation, Atrial fibrillation ([place]), Barrett's esophagus, Cancer (HCC), CHF (congestive heart failure) ([place]), COPD (chronic obstructive pulmonary disease) ([place]), Diverticulosis, GERD (gastroesophageal reflux disease), Gout, Headache, Heart murmur, Hematuria, Hernia of abdominal cavity, History of prosthetic heart valve, Hyperlipidemia, Hypertension, Nodular prostate with urinary obstruction, Presence of permanent cardiac pacemaker, and Substance abuse ([place]).    PAST SURGICAL HISTORY:  Past Surgical History:  Procedure Laterality Date  . CARDIAC VALVE REPLACEMENT N/A 2004  . CARPAL TUNNEL RELEASE Right 2015  . CATARACT EXTRACTION Right 2017  . COLONOSCOPY WITH PROPOFOL N/A 2014  . CORONARY ANGIOPLASTY     stints x2  . EYE SURGERY     cross eyed  . FOOT SURGERY Bilateral    [place]  . FRACTURE SURGERY Right    [place]  . INGUINAL HERNIA REPAIR Right 09/16/2017   Procedure: HERNIA REPAIR INGUINAL ADULT;  Surgeon: Christene Lye, MD;  Location: [facility] ORS;  Service: General;  Laterality: Right;  . INSERT / REPLACE / [place]     ICD  . PACEMAKER IMPLANT Left 2013   and ICD  . VASECTOMY      FAMILY HISTORY: family history includes Diabetes in his maternal grandmother; Heart attack in his mother; Lung cancer in his sister.  SOCIAL HISTORY:  reports that he quit smoking about 27 years ago. His smoking use included cigarettes. He has never used smokeless tobacco. He reports that he drinks about 2.0 standard drinks of alcohol per week. He reports that he does not use drugs.  ALLERGIES: Prednisone  MEDICATIONS:  Current Outpatient Medications  Medication Sig  Dispense Refill  . acetaminophen (TYLENOL) 650 MG CR tablet Take 650 mg by mouth 2 (two) times daily.    Marland Kitchen aspirin EC 81 MG tablet Take 81 mg by mouth daily.     Marland Kitchen COLCRYS 0.6 MG tablet TAKE 1 TABLET BY MOUTH ONCE DAILY 30 tablet 1  . desonide (DESOWEN) 0.05 % cream Apply topically 2 (two) times daily. (Patient not taking: Reported on 09/14/2018) 60 g 0  . diltiazem (CARDIZEM CD) 180 MG 24 hr capsule   3  . diltiazem (DILACOR XR) 180 MG 24 hr capsule Take 180 mg by mouth daily 90 capsule 4  . empagliflozin (JARDIANCE) 10 MG TABS tablet Take 10 mg by mouth daily.    . fexofenadine (ALLEGRA) 180 MG tablet Take 180 mg by mouth daily.    . finasteride (PROSCAR) 5 MG tablet TAKE 1 TABLET BY MOUTH ONCE DAILY 90 tablet 2  . fluticasone (FLONASE) 50 MCG/ACT nasal spray Place 2 sprays daily into both nostrils. (Patient not taking: Reported on 09/06/2018) 16 g 12  . furosemide (LASIX) 40 MG tablet Take 40 mg by mouth daily.     Marland Kitchen ketoconazole (NIZORAL) 2 % shampoo Apply 1 application topically 2 (two) times a week. (Patient not taking: Reported on 09/14/2018) 120 mL 2  . loratadine (CLARITIN) 10 MG tablet Take 1 tablet (10 mg total) by mouth daily. (Patient not taking: Reported on 09/06/2018) 90 tablet 3  . losartan (COZAAR) 25 MG tablet Take 1 tablet (25 mg total) by mouth daily. 90 tablet 4  . Magnesium 400 MG TABS Take 400 mg by mouth daily.     . metoprolol succinate (TOPROL-XL) 50 MG 24 hr tablet TAKE 1 TABLET BY MOUTH TWICE DAILY WITH MEALS OR IMMEDIATELY FOLLOWING MEAL 180 tablet 4  . Multiple Vitamins-Minerals (CENTRUM ADULTS PO) Take 1 tablet by mouth daily.     Marland Kitchen omeprazole (PRILOSEC) 20 MG capsule Take 1 capsule (20 mg total) by mouth daily. 90 capsule 4  . Polyethyl Glycol-Propyl Glycol (SYSTANE OP) Place 1 drop into both eyes as needed (for dry eyes).    . tamsulosin (FLOMAX) 0.4 MG CAPS capsule Take 2 capsules (0.8 mg total) by mouth daily. 90 capsule 4  . tiotropium (SPIRIVA) 18 MCG inhalation capsule Place 1 capsule (18 mcg total) into inhaler and inhale daily. 90 capsule 4  . UNABLE TO FIND CBD Hemp topical cream    . warfarin (COUMADIN) 1 MG  tablet TAKE 1 TABLET BY MOUTH ONCE DAILY (Patient not taking: Reported on 08/25/2018) 30 tablet 12  . warfarin (COUMADIN) 3 MG tablet TAKE 1 TABLET BY MOUTH ONCE DAILY. 30 tablet 2  . warfarin (COUMADIN) 4 MG tablet TAKE 1 TABLET BY MOUTH ONCE DAILY AT 6 P.M. (Patient not taking: Reported on 08/25/2018) 30 tablet 3   No current facility-administered medications for this encounter.    Facility-Administered Medications Ordered in Other Encounters  Medication Dose Route Frequency Provider Last Rate Last Dose  . 0.9 %  sodium chloride infusion   Intravenous Continuous Bruning, Kevin, PA-C        ECOG PERFORMANCE STATUS:  0 - Asymptomatic  REVIEW OF SYSTEMS: except for the weight loss and outlined cardiac history Patient denies any weight loss, fatigue, weakness, fever, chills or night sweats. Patient denies any loss of vision, blurred vision. Patient denies any ringing  of the ears or hearing loss. No irregular heartbeat. Patient denies heart murmur or history of fainting. Patient denies any chest pain or pain radiating  to her upper extremities. Patient denies any shortness of breath, difficulty breathing at night, cough or hemoptysis. Patient denies any swelling in the lower legs. Patient denies any nausea vomiting, vomiting of blood, or coffee ground material in the vomitus. Patient denies any stomach pain. Patient states has had normal bowel movements no significant constipation or diarrhea. Patient denies any dysuria, hematuria or significant nocturia. Patient denies any problems walking, swelling in the joints or loss of balance. Patient denies any skin changes, loss of hair or loss of weight. Patient denies any excessive worrying or anxiety or significant depression. Patient denies any problems with insomnia. Patient denies excessive thirst, polyuria, polydipsia. Patient denies any swollen glands, patient denies easy bruising or easy bleeding. Patient denies any recent infections, allergies or URI.  Patient "s visual fields have not changed significantly in recent time.    PHYSICAL EXAM: There were no vitals taken for this visit.patient does have left-sided anterior chestpacemaker present Well-developed well-nourished patient in NAD. HEENT reveals PERLA, EOMI, discs not visualized.  Oral cavity is clear. No oral mucosal lesions are identified. Neck is clear without evidence of cervical or supraclavicular adenopathy. Lungs are clear to A&P. Cardiac examination is essentially unremarkable with regular rate and rhythm without murmur rub or thrill. Abdomen is benign with no organomegaly or masses noted. Motor sensory and DTR levels are equal and symmetric in the upper and lower extremities. Cranial nerves II through XII are grossly intact. Proprioception is intact. No peripheral adenopathy or edema is identified. No motor or sensory levels are noted. Crude visual fields are within normal range.  LABORATORY DATA: pathology and cytology reports reviewed    RADIOLOGY RESULTS:cT scans and PET/CT scans reviewed   IMPRESSION: stage IIIa squamous cell carcinoma the right lower lobein30 year old male with multiple comorbidities  PLAN: this time I to go ahead with split course radiation therapy like to compress his right lower lobe lesion as well as his mediastinal adenopathy and treat up to 4000 cGy over 4 weeks. This will be done along with concurrent chemotherapy under Dr. Elroy Channel direction. I also would boost his right lower lobe lesion another 3000 cGy after short 1 week break. Risks and benefits of treatment including possible radiation esophagitis fatigue skin reaction alteration of blood counts cough all were discussed in detail with the patient. His family seems to comprehend my treatment plan well.I first was set up and ordered CT simulation for early next week.There will be extra effort by both professional staff as well as technical staff to coordinate and manage concurrent chemoradiation and  ensuing side effects during his treatments.  Patient family seem to comprehend my treatment plan well.  I would like to take this opportunity to thank you for allowing me to participate in the care of your patient.Noreene Filbert, MD

## 2018-09-14 NOTE — Progress Notes (Signed)
  Oncology Nurse Navigator Documentation  Navigator Location: [facility]Med Onc (09/14/18 1100) Referral date to RadOnc/MedOnc: 08/22/18 (09/14/18 1100) )Navigator Encounter Type: Diagnostic Results;Follow-up Appt (09/14/18 1100)   Abnormal Finding Date: 08/11/18 (09/14/18 1100) Confirmed Diagnosis Date: 09/11/18 (09/14/18 1100)               Patient Visit Type: MedOnc;RadOnc (09/14/18 1100) Treatment Phase: Pre-Tx/Tx Discussion (09/14/18 1100) Barriers/Navigation Needs: Coordination of Care;Education (09/14/18 1100) Education: Understanding Cancer/ Treatment Options;Newly Diagnosed Cancer Education (09/14/18 1100) Interventions: Coordination of Care (09/14/18 1100)   Coordination of Care: Appts;Radiology;Chemo (09/14/18 1100)        Acuity: Level 2 (09/14/18 1100)   Acuity Level 2: Initial guidance, education and coordination as needed;Educational needs;Assistance expediting appointments (09/14/18 1100)  met with patient and his family during follow up visit with Dr. Janese Banks to discuss pathology results and treatment options. All questions answered at the time of visit. Pt and family given resources regarding diagnosis and supportive services available. Pt stated that after hearing treatment options that he would like to discuss with family after visit to decide if he wants to pursue treatment or not. Contact info given to pt and family and instructed to call once decision is determined. Pt escorted to rad-onc for consultation with Dr. Baruch Gouty to discuss radiation treatment. After visit with Dr. Baruch Gouty, pt and family have decided they wanted to try treatment and pursue treatment planning at this time. Pt informed that will work on getting him scheduled for port placement and chemo class at this time. Informed that once radiation is scheduled to start then will schedule first chemo with Dr. Janese Banks even if port is not placed on that time. Instructed pt and family to call with any further questions  or needs. Pt and family verbalized understanding.    Time Spent with Patient: 90 (09/14/18 1100)

## 2018-09-14 NOTE — Progress Notes (Signed)
Patient c/o having an active hernia / left lower abdomen

## 2018-09-14 NOTE — Progress Notes (Signed)
Patient in need of home oxygen due to low oxygen levels while in clinic.   Pulse oximetry readings: At rest on room air = 93% With exertion on room air = 84% With exertion on 2L [place] = 95%  Pt informed to expect phone call from [facility] to set up oxygen delivery.

## 2018-09-15 ENCOUNTER — Encounter (INDEPENDENT_AMBULATORY_CARE_PROVIDER_SITE_OTHER): Payer: Self-pay

## 2018-09-15 ENCOUNTER — Other Ambulatory Visit (INDEPENDENT_AMBULATORY_CARE_PROVIDER_SITE_OTHER): Payer: Self-pay | Admitting: Nurse Practitioner

## 2018-09-15 DIAGNOSIS — N183 Chronic kidney disease, stage 3 (moderate): Secondary | ICD-10-CM | POA: Diagnosis not present

## 2018-09-15 DIAGNOSIS — Z9981 Dependence on supplemental oxygen: Secondary | ICD-10-CM | POA: Diagnosis not present

## 2018-09-15 DIAGNOSIS — C3431 Malignant neoplasm of lower lobe, right bronchus or lung: Secondary | ICD-10-CM | POA: Diagnosis not present

## 2018-09-15 DIAGNOSIS — I4891 Unspecified atrial fibrillation: Secondary | ICD-10-CM | POA: Diagnosis not present

## 2018-09-15 DIAGNOSIS — Z952 Presence of prosthetic heart valve: Secondary | ICD-10-CM | POA: Diagnosis not present

## 2018-09-15 DIAGNOSIS — J449 Chronic obstructive pulmonary disease, unspecified: Secondary | ICD-10-CM | POA: Diagnosis not present

## 2018-09-15 DIAGNOSIS — R2689 Other abnormalities of gait and mobility: Secondary | ICD-10-CM | POA: Diagnosis not present

## 2018-09-15 DIAGNOSIS — I130 Hypertensive heart and chronic kidney disease with heart failure and stage 1 through stage 4 chronic kidney disease, or unspecified chronic kidney disease: Secondary | ICD-10-CM | POA: Diagnosis not present

## 2018-09-15 DIAGNOSIS — M6281 Muscle weakness (generalized): Secondary | ICD-10-CM | POA: Diagnosis not present

## 2018-09-15 DIAGNOSIS — Z7901 Long term (current) use of anticoagulants: Secondary | ICD-10-CM | POA: Diagnosis not present

## 2018-09-15 DIAGNOSIS — I5022 Chronic systolic (congestive) heart failure: Secondary | ICD-10-CM | POA: Diagnosis not present

## 2018-09-15 DIAGNOSIS — Z8673 Personal history of transient ischemic attack (TIA), and cerebral infarction without residual deficits: Secondary | ICD-10-CM | POA: Diagnosis not present

## 2018-09-15 DIAGNOSIS — Z7982 Long term (current) use of aspirin: Secondary | ICD-10-CM | POA: Diagnosis not present

## 2018-09-15 DIAGNOSIS — Z95810 Presence of automatic (implantable) cardiac defibrillator: Secondary | ICD-10-CM | POA: Diagnosis not present

## 2018-09-16 DIAGNOSIS — C3491 Malignant neoplasm of unspecified part of right bronchus or lung: Secondary | ICD-10-CM | POA: Insufficient documentation

## 2018-09-16 MED ORDER — LORAZEPAM 0.5 MG PO TABS: 0.5000 mg | ORAL_TABLET | Freq: Four times a day (QID) | ORAL | 0 refills | Status: DC | PRN

## 2018-09-16 MED ORDER — LIDOCAINE-PRILOCAINE 2.5-2.5 % EX CREA: TOPICAL_CREAM | CUTANEOUS | 3 refills | Status: DC

## 2018-09-16 MED ORDER — PROCHLORPERAZINE MALEATE 10 MG PO TABS: 10.0000 mg | ORAL_TABLET | Freq: Four times a day (QID) | ORAL | 1 refills | Status: DC | PRN

## 2018-09-16 MED ORDER — DEXAMETHASONE 4 MG PO TABS: 8.0000 mg | ORAL_TABLET | Freq: Every day | ORAL | 1 refills | Status: DC

## 2018-09-16 MED ORDER — ONDANSETRON HCL 8 MG PO TABS: 8.0000 mg | ORAL_TABLET | Freq: Two times a day (BID) | ORAL | 1 refills | Status: DC | PRN

## 2018-09-16 NOTE — Progress Notes (Signed)
START OFF PATHWAY REGIMEN - Non-Small Cell Lung   [id number]:Carboplatin + Paclitaxel (2/50) + RT weekly x 6 weeks:   Administer weekly during RT:     Paclitaxel      Carboplatin   **Always confirm dose/schedule in your pharmacy ordering system**  Patient Characteristics: Stage III - Unresectable, PS ? 2 AJCC T Category: T3 Current Disease Status: No Distant Mets or Local Recurrence AJCC N Category: N1 AJCC M Category: M0 AJCC 8 Stage Grouping: IIIA Performance Status: PS ? 2 Intent of Therapy: Curative Intent, Discussed with Patient

## 2018-09-16 NOTE — Progress Notes (Signed)
Hematology/Oncology Consult note [facility]  Telephone:([phone] Fax:([phone]  Patient Care Team: Guadalupe Maple, MD as PCP - General (Family Medicine) Murlean Iba, MD (Internal Medicine) Isaias Cowman, MD as Consulting Physician (Cardiology) Christene Lye, MD (General Surgery) Telford Nab, RN as Registered Nurse   Name of the patient: Kirk Jones  [id number]  12-01-1940   Date of visit: 09/16/18  Diagnosis- Squamous cell carcinoma of the lung Stage IIIA T3N1M0  Chief complaint/ Reason for visit- discuss results of PET and biopsy and further management  Heme/Onc history: patient is a 77 year old Caucasian male with a past medical history significant for mechanical aortic valve replacement in 2004.  He also has a history of chronic systolic congestive heart failure, ischemic cardiomyopathy, ventricular tachycardia status post ICD placement.  He has a history of CVA in 2004 as well as hyperlipidemia and chronic kidney disease.  He has had stent placements in the past and follows up with cardiology as well.  He had an episode of ventricular tachycardia with ICD shock in August 2018.  He has been a chronic smoker and states that he started smoking at the age of 63 quit smoking when he was 56.  Patient presented to the ER with symptoms of worsening shortness of breath which led to a chest x-ray.  Chest x-ray showed masslike density at the level of spine.  This was followed by a CT chest without contrast.  CT showed solid-appearing right lower lobe mass 6.6 x 5.6 cm in size extending towards the right hilum.  Minimal mediastinal adenopathy with right infrahilar adenopathy was noted.  Small nodular opacities throughout the right lung suspicious for metastases.  Questionable single lesion noted on the left.   PET CT showed hypermetabolism in RLL abd possible subcarinal adenopathy. Ascending thoracic aneurysm 4.8 cm in diameter. Subtle  metabolic activity and calcifications in pancreatic head  Interval history- reports feeling sob on exertion. Feels fatigued. Appetite is fair. Denies any pain  ECOG PS- 2 Pain scale- 0 Opioid associated constipation- no  Review of systems- Review of Systems  Constitutional: Positive for malaise/fatigue. Negative for chills, fever and weight loss.  HENT: Negative for congestion, ear discharge and nosebleeds.   Eyes: Negative for blurred vision.  Respiratory: Positive for shortness of breath. Negative for cough, hemoptysis, sputum production and wheezing.   Cardiovascular: Negative for chest pain, palpitations, orthopnea and claudication.  Gastrointestinal: Negative for abdominal pain, blood in stool, constipation, diarrhea, heartburn, melena, nausea and vomiting.  Genitourinary: Negative for dysuria, flank pain, frequency, hematuria and urgency.  Musculoskeletal: Negative for back pain, joint pain and myalgias.  Skin: Negative for rash.  Neurological: Negative for dizziness, tingling, focal weakness, seizures, weakness and headaches.  Endo/Heme/Allergies: Does not bruise/bleed easily.  Psychiatric/Behavioral: Negative for depression and suicidal ideas. The patient does not have insomnia.      Allergies  Allergen Reactions  . Prednisone Other (See Comments)    Reaction: "organs shutting down"     Past Medical History:  Diagnosis Date  . AICD (automatic cardioverter/defibrillator) present   . Aortic valve regurgitation   . Atrial fibrillation ([place])   . Barrett's esophagus   . Cancer ([place])    sate III lung  . CHF (congestive heart failure) ([place])   . COPD (chronic obstructive pulmonary disease) ([place])   . Diverticulosis   . GERD (gastroesophageal reflux disease)   . Gout   . Headache    aura only  . Heart murmur   .  Hematuria   . Hernia of abdominal cavity   . History of prosthetic heart valve   . Hyperlipidemia   . Hypertension   . Nodular prostate with urinary  obstruction   . Presence of permanent cardiac pacemaker   . Substance abuse ([place])    alcohol     Past Surgical History:  Procedure Laterality Date  . CARDIAC VALVE REPLACEMENT N/A 2004  . CARPAL TUNNEL RELEASE Right 2015  . CATARACT EXTRACTION Right 2017  . COLONOSCOPY WITH PROPOFOL N/A 2014  . CORONARY ANGIOPLASTY     stints x2  . EYE SURGERY     cross eyed  . FOOT SURGERY Bilateral    [place]  . FRACTURE SURGERY Right    [place]  . INGUINAL HERNIA REPAIR Right 09/16/2017   Procedure: HERNIA REPAIR INGUINAL ADULT;  Surgeon: Christene Lye, MD;  Location: [facility] ORS;  Service: General;  Laterality: Right;  . INSERT / REPLACE / [place]     ICD  . PACEMAKER IMPLANT Left 2013   and ICD  . VASECTOMY      Social History   Socioeconomic History  . Marital status: Married    Spouse name: Not on file  . Number of children: Not on file  . Years of education: Not on file  . Highest education level: Not on file  Occupational History  . Not on file  Social Needs  . Financial resource strain: Not hard at all  . Food insecurity:    Worry: Never true    Inability: Never true  . Transportation needs:    Medical: No    Non-medical: No  Tobacco Use  . Smoking status: Former Smoker    Types: Cigarettes    Last attempt to quit: 04/24/1991    Years since quitting: 27.4  . Smokeless tobacco: Never Used  Substance and Sexual Activity  . Alcohol use: Yes    Alcohol/week: 2.0 standard drinks    Types: 2 Shots of liquor per week    Comment: quit December 2015/restarted-2 drinks per night  . Drug use: No  . Sexual activity: Never  Lifestyle  . Physical activity:    Days per week: 0 days    Minutes per session: 0 min  . Stress: Not at all  Relationships  . Social connections:    Talks on phone: Once a week    Gets together: Once a week    Attends religious service: More than 4 times per year    Active member of club or organization: No    Attends meetings of  clubs or organizations: Never    Relationship status: Married  . Intimate partner violence:    Fear of current or ex partner: No    Emotionally abused: No    Physically abused: No    Forced sexual activity: No  Other Topics Concern  . Not on file  Social History Narrative  . Not on file    Family History  Problem Relation Age of Onset  . Heart attack Mother   . Diabetes Maternal Grandmother   . Lung cancer Sister      Current Outpatient Medications:  .  aspirin EC 81 MG tablet, Take 81 mg by mouth daily., Disp: , Rfl:  .  COLCRYS 0.6 MG tablet, TAKE 1 TABLET BY MOUTH ONCE DAILY, Disp: 30 tablet, Rfl: 1 .  diltiazem (CARDIZEM CD) 180 MG 24 hr capsule, , Disp: , Rfl: 3 .  empagliflozin (JARDIANCE) 10 MG TABS tablet,  Take 10 mg by mouth daily., Disp: , Rfl:  .  fexofenadine (ALLEGRA) 180 MG tablet, Take 180 mg by mouth daily., Disp: , Rfl:  .  finasteride (PROSCAR) 5 MG tablet, TAKE 1 TABLET BY MOUTH ONCE DAILY, Disp: 90 tablet, Rfl: 2 .  furosemide (LASIX) 40 MG tablet, Take 40 mg by mouth daily. , Disp: , Rfl:  .  losartan (COZAAR) 25 MG tablet, Take 1 tablet (25 mg total) by mouth daily., Disp: 90 tablet, Rfl: 4 .  Magnesium 400 MG TABS, Take 400 mg by mouth daily. , Disp: , Rfl:  .  metoprolol succinate (TOPROL-XL) 50 MG 24 hr tablet, TAKE 1 TABLET BY MOUTH TWICE DAILY WITH MEALS OR IMMEDIATELY FOLLOWING MEAL, Disp: 180 tablet, Rfl: 4 .  Multiple Vitamins-Minerals (CENTRUM ADULTS PO), Take 1 tablet by mouth daily. , Disp: , Rfl:  .  omeprazole (PRILOSEC) 20 MG capsule, Take 1 capsule (20 mg total) by mouth daily., Disp: 90 capsule, Rfl: 4 .  tamsulosin (FLOMAX) 0.4 MG CAPS capsule, Take 2 capsules (0.8 mg total) by mouth daily., Disp: 90 capsule, Rfl: 4 .  tiotropium (SPIRIVA) 18 MCG inhalation capsule, Place 1 capsule (18 mcg total) into inhaler and inhale daily., Disp: 90 capsule, Rfl: 4 .  warfarin (COUMADIN) 3 MG tablet, TAKE 1 TABLET BY MOUTH ONCE DAILY., Disp: 30 tablet,  Rfl: 2 .  acetaminophen (TYLENOL) 650 MG CR tablet, Take 650 mg by mouth 2 (two) times daily., Disp: , Rfl:  .  desonide (DESOWEN) 0.05 % cream, Apply topically 2 (two) times daily. (Patient not taking: Reported on 09/14/2018), Disp: 60 g, Rfl: 0 .  diltiazem (DILACOR XR) 180 MG 24 hr capsule, Take 180 mg by mouth daily, Disp: 90 capsule, Rfl: 4 .  fluticasone (FLONASE) 50 MCG/ACT nasal spray, Place 2 sprays daily into both nostrils. (Patient not taking: Reported on 09/06/2018), Disp: 16 g, Rfl: 12 .  ketoconazole (NIZORAL) 2 % shampoo, Apply 1 application topically 2 (two) times a week. (Patient not taking: Reported on 09/14/2018), Disp: 120 mL, Rfl: 2 .  loratadine (CLARITIN) 10 MG tablet, Take 1 tablet (10 mg total) by mouth daily. (Patient not taking: Reported on 09/06/2018), Disp: 90 tablet, Rfl: 3 .  Polyethyl Glycol-Propyl Glycol (SYSTANE OP), Place 1 drop into both eyes as needed (for dry eyes)., Disp: , Rfl:  .  UNABLE TO FIND, CBD Hemp topical cream, Disp: , Rfl:  .  warfarin (COUMADIN) 1 MG tablet, TAKE 1 TABLET BY MOUTH ONCE DAILY (Patient not taking: Reported on 08/25/2018), Disp: 30 tablet, Rfl: 12 .  warfarin (COUMADIN) 4 MG tablet, TAKE 1 TABLET BY MOUTH ONCE DAILY AT 6 P.M. (Patient not taking: Reported on 08/25/2018), Disp: 30 tablet, Rfl: 3 No current facility-administered medications for this visit.   Facility-Administered Medications Ordered in Other Visits:  .  0.9 %  sodium chloride infusion, , Intravenous, Continuous, Antionette Char  Physical exam:  Vitals:   09/14/18 0854  BP: 128/85  Pulse: 68  Resp: 18  Temp: 98.1 F (36.7 C)  TempSrc: Tympanic  SpO2: 95%  Weight: 167 lb (75.8 kg)  Height: 5\' 3"  (1.6 m)   Physical Exam  Constitutional: He is oriented to person, place, and time. He appears well-developed and well-nourished.  Elderly gentleman who is frail. Ambulates with a cane. Appears in no acute distress  HENT:  Head: Normocephalic and atraumatic.    Eyes: Pupils are equal, round, and reactive to light. EOM are normal.  Neck: Normal  range of motion.  Cardiovascular: Normal rate, regular rhythm and normal heart sounds.  Pulmonary/Chest: Effort normal and breath sounds normal.  Abdominal: Soft. Bowel sounds are normal.  Neurological: He is alert and oriented to person, place, and time.  Skin: Skin is warm and dry.     CMP Latest Ref Rng & Units 08/25/2018  Glucose 70 - 99 mg/dL 108(H)  BUN 8 - 23 mg/dL 20  Creatinine 0.61 - 1.24 mg/dL 0.97  Sodium 135 - 145 mmol/L 131(L)  Potassium 3.5 - 5.1 mmol/L 4.3  Chloride 98 - 111 mmol/L 90(L)  CO2 22 - 32 mmol/L 27  Calcium 8.9 - 10.3 mg/dL 9.5  Total Protein 6.5 - 8.1 g/dL 7.8  Total Bilirubin 0.3 - 1.2 mg/dL 1.5(H)  Alkaline Phos 38 - 126 U/L 123  AST 15 - 41 U/L 25  ALT 0 - 44 U/L 11   CBC Latest Ref Rng & Units 09/08/2018  WBC 4.0 - 10.5 K/uL 12.0(H)  Hemoglobin 13.0 - 17.0 g/dL 11.7(L)  Hematocrit 39.0 - 52.0 % 35.2(L)  Platelets 150 - 400 K/uL 361    No images are attached to the encounter.  Ct Head W Wo Contrast  Result Date: 09/04/2018 CLINICAL DATA:  Lung mass.  Staging for metastatic disease. EXAM: CT HEAD WITHOUT AND WITH CONTRAST TECHNIQUE: Contiguous axial images were obtained from the base of the skull through the vertex without and with intravenous contrast CONTRAST:  31mL OMNIPAQUE IOHEXOL 300 MG/ML  SOLN COMPARISON:  CT head 06/13/2017 FINDINGS: Brain: Moderate atrophy. Mild chronic microvascular ischemic change in the white matter. Negative for acute infarct, hemorrhage, or mass. Normal enhancement postcontrast infusion. No enhancing mass lesion identified. Vascular: Atherosclerotic calcification, advanced. Negative for hyperdense vessel Skull: Negative Sinuses/Orbits: Bilateral cataract surgery. Visualized paranasal sinuses clear. Other: None IMPRESSION: No acute abnormality.  Negative for metastatic disease to the brain Atrophy and chronic microvascular ischemia.  Electronically Signed   By: Franchot Gallo M.D.   On: 09/04/2018 13:19   Ct Chest Wo Contrast  Result Date: 08/22/2018 CLINICAL DATA:  Abnormal chest x-ray with rounded masslike opacity overlying the spine on the lateral view EXAM: CT CHEST WITHOUT CONTRAST TECHNIQUE: Multidetector CT imaging of the chest was performed following the standard protocol without IV contrast. COMPARISON:  Chest x-ray of 08/10/2018 FINDINGS: Cardiovascular: There is moderately severe thoracic aortic atherosclerosis present. Mid ascending thoracic aorta measures 47 mm in diameter. There is also aneurysmal dilatation of the proximal thoracic aortic arch measuring up to 49 mm in diameter. The descending thoracic aorta is normal in caliber also manifesting moderately severe thoracic aortic atherosclerosis. There is considerable calcification of the aortic valve and there are diffuse coronary artery calcifications in this patient who has previously undergone CABG. Cardiomegaly is noted and no pericardial effusion is seen. Mediastinum/Nodes: There are multiple borderline mediastinal lymph nodes. Hilar nodes are difficult to assess on this unenhanced study. However there is suspicion of enlarged right infrahilar nodes as well. Rounded calcification is noted within the right thyroid gland with the left thyroid gland being small. No definite hiatal hernia is seen. Lungs/Pleura: There is a moderately large right pleural effusion present. As questioned on chest x-ray there is a rounded solid-appearing mass in the right lower lobe laterally measuring 6.6 x 5.6 cm with attenuation of 35 HU, consistent with primary lung carcinoma. This lesion does appear to extend toward the right infrahilar region. Also, on lung window images there are several small nodular lesions scattered throughout the lungs right lung,  suspicious for metastases in view of the right lower lobe mass. Also there are changes of centrilobular emphysema noted. The bronchi to the  right middle lobe and right lower lobe are narrowed by the apparent right infrahilar adenopathy. On the right there is a question of a vague nodule better seen on the sagittal view on image number 129 series 7, and a contralateral metastasis cannot be excluded. Upper Abdomen: The liver somewhat small. No definite metastatic involvement is seen. Contours of the liver may be slightly nodular and correlation with liver function tests is recommended. Musculoskeletal: The thoracic vertebrae are in normal alignment with degenerative change in the lower thoracic and upper lumbar spine. No lytic or blastic lesion is seen. IMPRESSION: 1. The area questioned on lateral chest x-ray does represent a solid-appearing mass in the right lower lobe consistent with primary lung carcinoma of 6.6 x 5.6 cm extending toward the right hilum. 2. Minimal mediastinal adenopathy with right infrahilar adenopathy present but poorly visualized on this unenhanced study. 3. Small nodular opacities primarily throughout the right lung are suspicious for metastasis with questionable single lesion noted on the left. 4. The mid ascending thoracic aorta measures 47 mm in diameter. Ascending thoracic aortic aneurysm. Recommend semi-annual imaging followup by CTA or MRA and referral to cardiothoracic surgery if not already obtained. This recommendation follows 2010 ACCF/AHA/AATS/ACR/ASA/SCA/SCAI/SIR/STS/SVM Guidelines for the Diagnosis and Management of Patients With Thoracic Aortic Disease. Circulation. 2010; 121: e266-e369 5. Moderate size right pleural effusion. 6. Centrilobular emphysema. Electronically Signed   By: Ivar Drape M.D.   On: 08/22/2018 08:21   Ct Guided Needle Placement  Result Date: 09/08/2018 CLINICAL DATA:  Hypermetabolic right lower lobe lung lesion with effusion. Shortness of breath. EXAM: CT GUIDED PARACENTESIS CT-GUIDED CORE BIOPSY OF RIGHT LOWER LOBE LUNG LESION ANESTHESIA/SEDATION: Intravenous Fentanyl and Versed were  administered as conscious sedation during continuous monitoring of the patient's level of consciousness and physiological / cardiorespiratory status by the radiology RN, with a total moderate sedation time of 24 minutes. PROCEDURE: The procedure risks, benefits, and alternatives were explained to the patient. Questions regarding the procedure were encouraged and answered. The patient understands and consents to the procedure. patient placed left lateral decubitus. select axial scans through the thorax obtained. Lesion and moderate effusion or localized. The skin entry site for diagnostic and therapeutic for thoracentesis was selected. The operative field was prepped with chlorhexidinein a sterile fashion, and a sterile drape was applied covering the operative field. A sterile gown and sterile gloves were used for the procedure. Local anesthesia was provided with 1% Lidocaine. A Safe-T-Centesis needle was advanced into the right pleural space until fluid could be aspirated. 1.2 L of clear yellow fluid were removed. The patient tolerated the procedure well. Fluid sent for cytology. In similar fashion, skin entry site for biopsy of the right lower lobe lesion was selected and marked. The operative field was prepped with chlorhexidinein a sterile fashion, and a sterile drape was applied covering the operative field. A sterile gown and sterile gloves were used for the procedure. Local anesthesia was provided with 1% Lidocaine. Under CT fluoroscopic guidance, a 17 gauge trocar needle was advanced to the margin of the lesion. Once needle tip position was confirmed, coaxial 18-gauge core biopsy samples were obtained, submitted in formalin to surgical pathology. The guide needle was removed. Postprocedure scans show no pneumothorax or hemorrhage. Significant decrease in right pleural effusion. COMPLICATIONS: None immediate FINDINGS: Limited CT demonstrates moderate right pleural effusion and again we demonstrates the  right  lower lobe mass. 1.2 L right pleural fluid were aspirated and sent for cytology analysis. Core biopsy samples of the right lower lobe lesion obtained as above. The patient tolerated the procedure well. IMPRESSION: 1. Technically successful CT-guided right thoracentesis removing 1.2 L. 2. Technically successful CT-guided right lower lobe lung lesion core biopsy. Electronically Signed   By: Lucrezia Europe M.D.   On: 09/08/2018 15:17   Nm Pet Image Initial (pi) Skull Base To Thigh  Result Date: 08/30/2018 CLINICAL DATA:  Initial treatment strategy for right lower lobe mass. EXAM: NUCLEAR MEDICINE PET SKULL BASE TO THIGH TECHNIQUE: 8.8 mCi F-18 FDG was injected intravenously. Full-ring PET imaging was performed from the skull base to thigh after the radiotracer. CT data was obtained and used for attenuation correction and anatomic localization. Fasting blood glucose: 89 mg/dl COMPARISON:  CT chest 08/21/2018 FINDINGS: Mediastinal blood pool activity: SUV max 2.3 NECK: No significant abnormal hypermetabolic activity in this region. Incidental CT findings: There is atherosclerotic calcification of the cavernous carotid arteries bilaterally. Bilateral common carotid atherosclerotic calcification. 1.9 cm calcified right thyroid nodule without accentuated metabolic activity. CHEST: 6.5 by 6.1 cm right lower lobe mass has maximum SUV of 15.0, along with central low activity compatible with central necrosis. Subcarinal lymph node measuring 1.0 cm in short axis on image 94/3 has a maximum SUV of 3.7. Small to moderate-sized right pleural effusion with low-grade activity, typical SUV about 1.6. Centrilobular emphysema. Incidental CT findings: Coronary, aortic arch, and branch vessel atherosclerotic vascular disease. Ascending aortic aneurysm 4.8 cm in diameter. Single lead AICD. Mild cardiomegaly. Dense calcification of the aortic valve.  Calcified mitral valve. Regional muscular atrophy in the chest. Trace left pleural  effusion. ABDOMEN/PELVIS: Subtle accentuated metabolic activity upper pancreatic head anteriorly with maximum SUV in this vicinity approximately 3.4, whereas other portions of the pancreas have an SUV more typically around 2.0. Comparing this area, there is a suggestion of some faint punctate calcifications for example on images 153-154 of series 3. Faint calcification in this vicinity also appears to be present on the chest CT from 08/21/2018 but not on the CT abdomen from 02/15/2007. Incidental CT findings: Aortoiliac atherosclerotic vascular disease. Left renal scarring. Hypodense and photopenic lesion of the right kidney lower pole compatible with cyst. Infrarenal abdominal aortic ectasia up to 2.4 cm in diameter on image 170/3. Descending and sigmoid colon diverticulosis. Stranding along the paracolic gutters. SKELETON: No significant abnormal hypermetabolic activity in this region. Incidental CT findings: Deformity in the right proximal femur. Fatty replacement of much of the upper right vastus lateralis muscle. Lumbar spondylosis and degenerative disc disease. Comment old bilateral rib deformities from prior fractures. Probable osteochondroma of the left lateral maxilla on image 27/3. IMPRESSION: 1. 6.5 cm right lower lobe mass is highly hypermetabolic with maximum SUV of 15.0, and has central necrosis. 2. Mildly hypermetabolic subcarinal lymph node, 1.0 cm in short axis with maximum SUV 3.7, possibly from metastatic involvement. 3. Small to moderate-sized right pleural effusion. This has low-grade activity with maximum SUV of 1.6 suggesting an exudative effusion but not specific for malignancy. No hypermetabolic pleural nodularity observed. 4. Subtle accentuated metabolic activity in the upper pancreatic head anteriorly with some very faint associated calcifications. A neoplastic lesion in this vicinity is not excluded. Presuming that the patient's AICD is not MRI compatible, pancreatic protocol CT of the  abdomen with and without contrast would be recommended for further characterization. 5. Ascending aortic aneurysm 4.8 cm in diameter. Ascending thoracic aortic aneurysm.  Recommend semi-annual imaging followup by CTA or MRA and referral to cardiothoracic surgery if not already obtained. This recommendation follows 2010 ACCF/AHA/AATS/ACR/ASA/SCA/SCAI/SIR/STS/SVM Guidelines for the Diagnosis and Management of Patients With Thoracic Aortic Disease. Circulation. 2010; 121: e266-e369 6. Other imaging findings of potential clinical significance: Aortic Atherosclerosis (ICD10-I70.0). Emphysema (ICD10-J43.9). Aortic aneurysm NOS (ICD10-I71.9). Mild cardiomegaly. Aortic and mitral valve calcification. Trace left pleural effusion. Left renal scarring. Descending and sigmoid colon diverticulosis. Remote posttraumatic deformity of the right proximal femoral shaft. Electronically Signed   By: Van Clines M.D.   On: 08/30/2018 12:56   Ct Biopsy  Result Date: 09/08/2018 CLINICAL DATA:  Hypermetabolic right lower lobe lung lesion with effusion. Shortness of breath. EXAM: CT GUIDED PARACENTESIS CT-GUIDED CORE BIOPSY OF RIGHT LOWER LOBE LUNG LESION ANESTHESIA/SEDATION: Intravenous Fentanyl and Versed were administered as conscious sedation during continuous monitoring of the patient's level of consciousness and physiological / cardiorespiratory status by the radiology RN, with a total moderate sedation time of 24 minutes. PROCEDURE: The procedure risks, benefits, and alternatives were explained to the patient. Questions regarding the procedure were encouraged and answered. The patient understands and consents to the procedure. patient placed left lateral decubitus. select axial scans through the thorax obtained. Lesion and moderate effusion or localized. The skin entry site for diagnostic and therapeutic for thoracentesis was selected. The operative field was prepped with chlorhexidinein a sterile fashion, and a sterile  drape was applied covering the operative field. A sterile gown and sterile gloves were used for the procedure. Local anesthesia was provided with 1% Lidocaine. A Safe-T-Centesis needle was advanced into the right pleural space until fluid could be aspirated. 1.2 L of clear yellow fluid were removed. The patient tolerated the procedure well. Fluid sent for cytology. In similar fashion, skin entry site for biopsy of the right lower lobe lesion was selected and marked. The operative field was prepped with chlorhexidinein a sterile fashion, and a sterile drape was applied covering the operative field. A sterile gown and sterile gloves were used for the procedure. Local anesthesia was provided with 1% Lidocaine. Under CT fluoroscopic guidance, a 17 gauge trocar needle was advanced to the margin of the lesion. Once needle tip position was confirmed, coaxial 18-gauge core biopsy samples were obtained, submitted in formalin to surgical pathology. The guide needle was removed. Postprocedure scans show no pneumothorax or hemorrhage. Significant decrease in right pleural effusion. COMPLICATIONS: None immediate FINDINGS: Limited CT demonstrates moderate right pleural effusion and again we demonstrates the right lower lobe mass. 1.2 L right pleural fluid were aspirated and sent for cytology analysis. Core biopsy samples of the right lower lobe lesion obtained as above. The patient tolerated the procedure well. IMPRESSION: 1. Technically successful CT-guided right thoracentesis removing 1.2 L. 2. Technically successful CT-guided right lower lobe lung lesion core biopsy. Electronically Signed   By: Lucrezia Europe M.D.   On: 09/08/2018 15:17     Assessment and plan- Patient is a 77 y.o. 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 Discussed results of biopsy as well. He  has Stage III squamous cell carcinoma of the lung. Standard of care would be concurrent chemo/RT. I also discussed with Rad onc DR. Chrystal to see if sequential chemo/RT could be a consideration given his comorbidities and frailty. Dr. Baruch Gouty plans to give him RT in 2 installments 1st for 4 weeks and  the next session for 2-3 weeks.  I therefore recommend weekly carboplatin AUC 2 and taxol at 45 mg/ meter square concurrently with RT. Discussed risks and benefits of chemotherapy including all but not limited to nausea, vomiting, fatigue, low blood counts, risk of infections and hospitalizations. Risk of neuropathy and infusion reactions associated with taxol. Patient understands and initially said he would like to discuss with family if he would like to do chemo. Later he did make up his mind to proceed with chemotherapy which will be given with a curative intent.   If he does not tolerate chemo well, I will have a low threshold to stop chemo and conisder is sequentially. Also discussed role for maintenance immunotherapy with durvalumab for 1 year upon completion of chemo/RT  Patient will proceed with port placement by vascular surgery which per them does not require interruption of his anticoagulation. He will also attend chemo class.   RT to begin on 10/30 and chemo to start on 09/28/18  CT head did not show metastatic disease  Patient noted to have some hypermetabolism and calcifications in the pancreatic head. Discussed to visualize this better with CT pancreatic protocol since he cannot get MRI. Patient agrees to proceed  Will also refer him to vascular surgery to discuss surveillance of his ascending thoracic aneurysm  Patient and family had multiple insightful questions which were answered to their satisfaction.  Ambulatory oximetry does show that qualifies for Home O2 and we will make arrangements for the same  Cancer Staging Squamous cell carcinoma of right lung [facility]) Staging form:  Lung, AJCC 8th Edition - Clinical stage from 09/14/2018: Stage IIIA (cT3, cN1, cM0) - Signed by Sindy Guadeloupe, MD on 09/16/2018     Total face to face encounter time for this patient visit was 60 min. >50% of the time was  spent in counseling and coordination of care.     Visit Diagnosis 1. Malignant neoplasm of lung, unspecified laterality, unspecified part of lung ([place])   2. Chronic obstructive pulmonary disease, unspecified COPD type ([place])   3. Pancreatic calcification   4. Goals of care, counseling/discussion      Dr. Randa Evens, MD, MPH [facility] at [facility] [phone] 09/16/2018 7:14 AM

## 2018-09-18 ENCOUNTER — Telehealth: Payer: Self-pay | Admitting: Family Medicine

## 2018-09-18 ENCOUNTER — Telehealth: Payer: Self-pay

## 2018-09-18 ENCOUNTER — Other Ambulatory Visit: Payer: Self-pay

## 2018-09-18 ENCOUNTER — Ambulatory Visit
Admission: RE | Admit: 2018-09-18 | Discharge: 2018-09-18 | Disposition: A | Discharge status: Home / Self Care | Payer: Medicare Other | Source: Ambulatory Visit | Attending: Radiation Oncology | Admitting: Radiation Oncology

## 2018-09-18 ENCOUNTER — Ambulatory Visit: Payer: Medicare Other | Admitting: Oncology

## 2018-09-18 DIAGNOSIS — C3491 Malignant neoplasm of unspecified part of right bronchus or lung: Secondary | ICD-10-CM

## 2018-09-18 DIAGNOSIS — C3431 Malignant neoplasm of lower lobe, right bronchus or lung: Secondary | ICD-10-CM | POA: Insufficient documentation

## 2018-09-18 DIAGNOSIS — Z510 Encounter for antineoplastic radiation therapy: Secondary | ICD-10-CM | POA: Diagnosis not present

## 2018-09-18 NOTE — Patient Instructions (Signed)

## 2018-09-18 NOTE — Telephone Encounter (Signed)
Spoke with the patient to inform him that the Lorazepam 0.5 mg was denied due to the patient not previously taking any antipressive/ i have call the PA on the phone to appeal the denial that was sent back today on cover my meds. I have set up an appeal Ref # [id number] and phone number [phone]. It will be a 72 hour turn around.

## 2018-09-18 NOTE — Telephone Encounter (Signed)
Copied from [place] [id number]. Topic: General - Other >> Sep 18, 2018  1:34 PM Alfredia Ferguson R wrote: Patient called in wantin gto know if he can get his INR checked due to being placed back on Coumadin and it not being checked at cancer center. Please advise

## 2018-09-18 NOTE — Telephone Encounter (Signed)
Of course! Just get him scheduled.

## 2018-09-19 ENCOUNTER — Telehealth: Payer: Self-pay | Admitting: *Deleted

## 2018-09-19 ENCOUNTER — Ambulatory Visit: Payer: Medicare Other | Admitting: Family Medicine

## 2018-09-19 ENCOUNTER — Inpatient Hospital Stay: Payer: Medicare Other

## 2018-09-19 DIAGNOSIS — M6281 Muscle weakness (generalized): Secondary | ICD-10-CM | POA: Diagnosis not present

## 2018-09-19 DIAGNOSIS — I130 Hypertensive heart and chronic kidney disease with heart failure and stage 1 through stage 4 chronic kidney disease, or unspecified chronic kidney disease: Secondary | ICD-10-CM | POA: Diagnosis not present

## 2018-09-19 DIAGNOSIS — C3431 Malignant neoplasm of lower lobe, right bronchus or lung: Secondary | ICD-10-CM | POA: Diagnosis not present

## 2018-09-19 DIAGNOSIS — R2689 Other abnormalities of gait and mobility: Secondary | ICD-10-CM | POA: Diagnosis not present

## 2018-09-19 DIAGNOSIS — I5022 Chronic systolic (congestive) heart failure: Secondary | ICD-10-CM | POA: Diagnosis not present

## 2018-09-19 DIAGNOSIS — J449 Chronic obstructive pulmonary disease, unspecified: Secondary | ICD-10-CM | POA: Diagnosis not present

## 2018-09-19 NOTE — Telephone Encounter (Signed)
Order called to [employer]

## 2018-09-19 NOTE — Telephone Encounter (Signed)
yes

## 2018-09-19 NOTE — Telephone Encounter (Signed)
[facility] asking if Dr Janese Banks will sign POC orders. Please advise

## 2018-09-19 NOTE — Telephone Encounter (Signed)
Pt called to inform that spoke with [facility] about his oxygen and the portable tanks are not available at this time. Pt would like to try to get portable concentrator with another company. Referral faxed to [place].

## 2018-09-20 DIAGNOSIS — C3431 Malignant neoplasm of lower lobe, right bronchus or lung: Secondary | ICD-10-CM | POA: Diagnosis not present

## 2018-09-20 DIAGNOSIS — Z510 Encounter for antineoplastic radiation therapy: Secondary | ICD-10-CM | POA: Diagnosis not present

## 2018-09-21 ENCOUNTER — Ambulatory Visit
Admission: RE | Admit: 2018-09-21 | Discharge: 2018-09-21 | Disposition: A | Discharge status: Home / Self Care | Payer: Medicare Other | Source: Ambulatory Visit | Attending: Vascular Surgery | Admitting: Vascular Surgery

## 2018-09-21 ENCOUNTER — Other Ambulatory Visit: Payer: Self-pay

## 2018-09-21 ENCOUNTER — Other Ambulatory Visit (INDEPENDENT_AMBULATORY_CARE_PROVIDER_SITE_OTHER): Payer: Self-pay | Admitting: Nurse Practitioner

## 2018-09-21 ENCOUNTER — Telehealth: Payer: Self-pay | Admitting: Family Medicine

## 2018-09-21 ENCOUNTER — Encounter
Admission: RE | Disposition: A | Discharge status: Home / Self Care | Payer: Self-pay | Source: Ambulatory Visit | Attending: Vascular Surgery

## 2018-09-21 ENCOUNTER — Telehealth: Payer: Self-pay

## 2018-09-21 DIAGNOSIS — K219 Gastro-esophageal reflux disease without esophagitis: Secondary | ICD-10-CM | POA: Diagnosis not present

## 2018-09-21 DIAGNOSIS — Z9841 Cataract extraction status, right eye: Secondary | ICD-10-CM | POA: Insufficient documentation

## 2018-09-21 DIAGNOSIS — Z888 Allergy status to other drugs, medicaments and biological substances status: Secondary | ICD-10-CM | POA: Diagnosis not present

## 2018-09-21 DIAGNOSIS — I509 Heart failure, unspecified: Secondary | ICD-10-CM | POA: Insufficient documentation

## 2018-09-21 DIAGNOSIS — J449 Chronic obstructive pulmonary disease, unspecified: Secondary | ICD-10-CM | POA: Insufficient documentation

## 2018-09-21 DIAGNOSIS — Z8249 Family history of ischemic heart disease and other diseases of the circulatory system: Secondary | ICD-10-CM | POA: Diagnosis not present

## 2018-09-21 DIAGNOSIS — N400 Enlarged prostate without lower urinary tract symptoms: Secondary | ICD-10-CM | POA: Insufficient documentation

## 2018-09-21 DIAGNOSIS — Z7982 Long term (current) use of aspirin: Secondary | ICD-10-CM | POA: Insufficient documentation

## 2018-09-21 DIAGNOSIS — Z9889 Other specified postprocedural states: Secondary | ICD-10-CM | POA: Insufficient documentation

## 2018-09-21 DIAGNOSIS — M109 Gout, unspecified: Secondary | ICD-10-CM | POA: Insufficient documentation

## 2018-09-21 DIAGNOSIS — Z955 Presence of coronary angioplasty implant and graft: Secondary | ICD-10-CM | POA: Diagnosis not present

## 2018-09-21 DIAGNOSIS — Z801 Family history of malignant neoplasm of trachea, bronchus and lung: Secondary | ICD-10-CM | POA: Diagnosis not present

## 2018-09-21 DIAGNOSIS — I110 Hypertensive heart disease with heart failure: Secondary | ICD-10-CM | POA: Diagnosis not present

## 2018-09-21 DIAGNOSIS — E785 Hyperlipidemia, unspecified: Secondary | ICD-10-CM | POA: Insufficient documentation

## 2018-09-21 DIAGNOSIS — C3491 Malignant neoplasm of unspecified part of right bronchus or lung: Secondary | ICD-10-CM | POA: Insufficient documentation

## 2018-09-21 DIAGNOSIS — Z961 Presence of intraocular lens: Secondary | ICD-10-CM | POA: Diagnosis not present

## 2018-09-21 DIAGNOSIS — Z9852 Vasectomy status: Secondary | ICD-10-CM | POA: Diagnosis not present

## 2018-09-21 DIAGNOSIS — C3490 Malignant neoplasm of unspecified part of unspecified bronchus or lung: Secondary | ICD-10-CM

## 2018-09-21 DIAGNOSIS — Z87891 Personal history of nicotine dependence: Secondary | ICD-10-CM | POA: Insufficient documentation

## 2018-09-21 DIAGNOSIS — R0602 Shortness of breath: Secondary | ICD-10-CM | POA: Diagnosis not present

## 2018-09-21 DIAGNOSIS — K8689 Other specified diseases of pancreas: Secondary | ICD-10-CM | POA: Diagnosis not present

## 2018-09-21 DIAGNOSIS — R5383 Other fatigue: Secondary | ICD-10-CM | POA: Insufficient documentation

## 2018-09-21 DIAGNOSIS — Z79899 Other long term (current) drug therapy: Secondary | ICD-10-CM | POA: Insufficient documentation

## 2018-09-21 DIAGNOSIS — Z95810 Presence of automatic (implantable) cardiac defibrillator: Secondary | ICD-10-CM | POA: Diagnosis not present

## 2018-09-21 HISTORY — PX: PORTA CATH INSERTION: CATH118285

## 2018-09-21 LAB — PROTIME-INR
INR: 1.53
PROTHROMBIN TIME: 18.2 s — AB (ref 11.4–15.2)

## 2018-09-21 SURGERY — PORTA CATH INSERTION
Anesthesia: Moderate Sedation

## 2018-09-21 MED ORDER — HEPARIN (PORCINE) IN NACL 1000-0.9 UT/500ML-% IV SOLN
INTRAVENOUS | Status: AC
  Filled 2018-09-21: qty 500

## 2018-09-21 MED ORDER — ONDANSETRON HCL 4 MG/2ML IJ SOLN: 4.0000 mg | Freq: Four times a day (QID) | INTRAMUSCULAR | Status: DC | PRN

## 2018-09-21 MED ORDER — FENTANYL CITRATE (PF) 100 MCG/2ML IJ SOLN
INTRAMUSCULAR | Status: AC
  Filled 2018-09-21: qty 2

## 2018-09-21 MED ORDER — MIDAZOLAM HCL 5 MG/5ML IJ SOLN
INTRAMUSCULAR | Status: AC
  Filled 2018-09-21: qty 5

## 2018-09-21 MED ORDER — DEXTROSE 5 % IV SOLN: 2.0000 g | Freq: Once | INTRAVENOUS | Status: DC

## 2018-09-21 MED ORDER — MIDAZOLAM HCL 2 MG/2ML IJ SOLN
INTRAMUSCULAR | Status: DC | PRN
  Administered 2018-09-21: 2 mg via INTRAVENOUS

## 2018-09-21 MED ORDER — CEFAZOLIN SODIUM-DEXTROSE 2-4 GM/100ML-% IV SOLN
INTRAVENOUS | Status: AC
  Filled 2018-09-21: qty 100

## 2018-09-21 MED ORDER — SODIUM CHLORIDE 0.9 % IV SOLN
Freq: Once | INTRAVENOUS | Status: DC
  Filled 2018-09-21 (×2): qty 2

## 2018-09-21 MED ORDER — HYDROMORPHONE HCL 1 MG/ML IJ SOLN: 1.0000 mg | Freq: Once | INTRAMUSCULAR | Status: DC | PRN

## 2018-09-21 MED ORDER — CEFAZOLIN SODIUM-DEXTROSE 2-4 GM/100ML-% IV SOLN: 2.0000 g | Freq: Once | INTRAVENOUS | Status: DC

## 2018-09-21 MED ORDER — SODIUM CHLORIDE 0.9 % IV SOLN: INTRAVENOUS | Status: DC

## 2018-09-21 MED ORDER — LIDOCAINE-EPINEPHRINE (PF) 1 %-1:200000 IJ SOLN
INTRAMUSCULAR | Status: AC
  Filled 2018-09-21: qty 30

## 2018-09-21 MED ORDER — FENTANYL CITRATE (PF) 100 MCG/2ML IJ SOLN
INTRAMUSCULAR | Status: DC | PRN
  Administered 2018-09-21: 50 ug via INTRAVENOUS
  Administered 2018-09-21: 12.5 ug via INTRAVENOUS

## 2018-09-21 SURGICAL SUPPLY — 11 items
ADH SKN CLS APL DERMABOND .7 (GAUZE/BANDAGES/DRESSINGS) ×1
DERMABOND ADVANCED (GAUZE/BANDAGES/DRESSINGS) ×2
DERMABOND ADVANCED .7 DNX12 (GAUZE/BANDAGES/DRESSINGS) IMPLANT
KIT PORT POWER 8FR ISP CVUE (Port) ×2 IMPLANT
PACK ANGIOGRAPHY (CUSTOM PROCEDURE TRAY) ×3 IMPLANT
PAD GROUND ADULT SPLIT (MISCELLANEOUS) ×2 IMPLANT
PENCIL ELECTRO HAND CTR (MISCELLANEOUS) ×3 IMPLANT
SUT MNCRL AB 4-0 PS2 18 (SUTURE) ×3 IMPLANT
SUT PROLENE 0 CT 1 30 (SUTURE) ×3 IMPLANT
SUT VIC AB 3-0 SH 27 (SUTURE) ×3
SUT VIC AB 3-0 SH 27X BRD (SUTURE) IMPLANT

## 2018-09-21 NOTE — H&P (Signed)
[facility] History & Physical Update  The patient was interviewed and re-examined.  The patient's previous History and Physical has been reviewed and is unchanged.  There is no change in the plan of care. We plan to proceed with the scheduled procedure.  Leotis Pain, MD  09/21/2018, 12:07 PM

## 2018-09-21 NOTE — Op Note (Signed)
      [facility]       Operative Note  Date: 09/21/2018  Preoperative diagnosis:  1. Lung cancer  Postoperative diagnosis:  Same as above  Procedures: #1. Ultrasound guidance for vascular access to the right internal jugular vein. #2. Fluoroscopic guidance for placement of catheter. #3. Placement of CT compatible Port-A-Cath, right internal jugular vein.  Surgeon: Leotis Pain, MD.   Anesthesia: Local with moderate conscious sedation for approximately 15  minutes using 2 mg of Versed and 62.5 mcg of Fentanyl  Fluoroscopy time: less than 1 minute  Contrast used: 0  Estimated blood loss: 5 cc  Indication for the procedure:  The patient is a 77 y.o.male with lung cancer and multiple other medical issues.  The patient needs a Port-A-Cath for durable venous access, chemotherapy, lab draws, and CT scans. We are asked to place this. Risks and benefits were discussed and informed consent was obtained.  Description of procedure: The patient was brought to the vascular and interventional radiology suite.  Moderate conscious sedation was administered throughout the procedure during a face to face encounter with the patient with my supervision of the RN administering medicines and monitoring the patient's vital signs, pulse oximetry, telemetry and mental status throughout from the start of the procedure until the patient was taken to the recovery room. The right neck chest and shoulder were sterilely prepped and draped, and a sterile surgical field was created. Ultrasound was used to help visualize a patent right internal jugular vein. This was then accessed under direct ultrasound guidance without difficulty with the Seldinger needle and a permanent image was recorded. A J-wire was placed. After skin nick and dilatation, the peel-away sheath was then placed over the wire. I then anesthetized an area under the clavicle approximately 1-2 fingerbreadths. A transverse incision was  created and an inferior pocket was created with electrocautery and blunt dissection. The port was then brought onto the field, placed into the pocket and secured to the chest wall with 2 Prolene sutures. The catheter was connected to the port and tunneled from the subclavicular incision to the access site. Fluoroscopic guidance was then used to cut the catheter to an appropriate length. The catheter was then placed through the peel-away sheath and the peel-away sheath was removed. The catheter tip was parked in excellent location under fluorocoscopic guidance in the cavoatrial junction. The pocket was then irrigated with antibiotic impregnated saline and the wound was closed with a running 3-0 Vicryl and a 4-0 Monocryl. The access incision was closed with a single 4-0 Monocryl. The Huber needle was used to withdraw blood and flush the port with heparinized saline. Dermabond was then placed as a dressing. The patient tolerated the procedure well and was taken to the recovery room in stable condition.   Leotis Pain 09/21/2018 2:13 PM   This note was created with Dragon Medical transcription system. Any errors in dictation are purely unintentional.

## 2018-09-21 NOTE — Telephone Encounter (Signed)
No, let's do it next week

## 2018-09-21 NOTE — Progress Notes (Signed)
Pt. Had a 5 beat run of VT/ Irreg. Beats. Dr. Lucky Cowboy made aware of last K+ 4.3 on 08/25/2018, pt. Asymptomatic,Pt. Has a Pacer/AICD. No new orders at present. Pt. States "I feel fine." Will continue monitoring pt.

## 2018-09-21 NOTE — Telephone Encounter (Signed)
[employer] PA has been approved for Lorazepam 0.5 09/18/18 to 09/19/19. The patient has been contacted to inform the PA was apporved. The patient was understanding and agreeable.

## 2018-09-21 NOTE — Telephone Encounter (Signed)
Copied from [place] [id number]. Topic: Quick Communication - See Telephone Encounter >> Sep 21, 2018  3:24 PM Berneta Levins wrote: CRM for notification. See Telephone encounter for: 09/21/18.  Pt states he had INR done at [facility] today and it was 1.53.  Pt states his goal is 2.5-3.5.  Pt wants to know if he needs to come in tomorrow.  Pt can be reached at [phone]

## 2018-09-21 NOTE — Telephone Encounter (Signed)
Patient notified, appointment scheduled for next week.

## 2018-09-22 ENCOUNTER — Encounter: Payer: Self-pay | Admitting: Vascular Surgery

## 2018-09-22 ENCOUNTER — Telehealth: Payer: Self-pay | Admitting: *Deleted

## 2018-09-22 ENCOUNTER — Ambulatory Visit: Payer: Medicare Other | Admitting: Family Medicine

## 2018-09-22 DIAGNOSIS — I130 Hypertensive heart and chronic kidney disease with heart failure and stage 1 through stage 4 chronic kidney disease, or unspecified chronic kidney disease: Secondary | ICD-10-CM | POA: Diagnosis not present

## 2018-09-22 DIAGNOSIS — C3431 Malignant neoplasm of lower lobe, right bronchus or lung: Secondary | ICD-10-CM | POA: Diagnosis not present

## 2018-09-22 DIAGNOSIS — I5022 Chronic systolic (congestive) heart failure: Secondary | ICD-10-CM | POA: Diagnosis not present

## 2018-09-22 DIAGNOSIS — J449 Chronic obstructive pulmonary disease, unspecified: Secondary | ICD-10-CM | POA: Diagnosis not present

## 2018-09-22 DIAGNOSIS — M6281 Muscle weakness (generalized): Secondary | ICD-10-CM | POA: Diagnosis not present

## 2018-09-22 DIAGNOSIS — R2689 Other abnormalities of gait and mobility: Secondary | ICD-10-CM | POA: Diagnosis not present

## 2018-09-22 NOTE — Telephone Encounter (Signed)
Ronalee Belts occupational therapist with [facility] called asking for order for occupational therapy  2 times aweek for 2 weeks Please call him back with verbal order to continue with this plan [phone]

## 2018-09-25 ENCOUNTER — Ambulatory Visit
Admission: RE | Admit: 2018-09-25 | Discharge: 2018-09-25 | Disposition: A | Discharge status: Home / Self Care | Payer: Medicare Other | Source: Ambulatory Visit | Attending: Oncology | Admitting: Oncology

## 2018-09-25 ENCOUNTER — Telehealth: Payer: Self-pay | Admitting: *Deleted

## 2018-09-25 DIAGNOSIS — R2689 Other abnormalities of gait and mobility: Secondary | ICD-10-CM | POA: Diagnosis not present

## 2018-09-25 DIAGNOSIS — K8689 Other specified diseases of pancreas: Secondary | ICD-10-CM | POA: Diagnosis not present

## 2018-09-25 DIAGNOSIS — I130 Hypertensive heart and chronic kidney disease with heart failure and stage 1 through stage 4 chronic kidney disease, or unspecified chronic kidney disease: Secondary | ICD-10-CM | POA: Diagnosis not present

## 2018-09-25 DIAGNOSIS — Z950 Presence of cardiac pacemaker: Secondary | ICD-10-CM | POA: Diagnosis not present

## 2018-09-25 DIAGNOSIS — C3431 Malignant neoplasm of lower lobe, right bronchus or lung: Secondary | ICD-10-CM | POA: Diagnosis not present

## 2018-09-25 DIAGNOSIS — J449 Chronic obstructive pulmonary disease, unspecified: Secondary | ICD-10-CM | POA: Diagnosis not present

## 2018-09-25 DIAGNOSIS — I5022 Chronic systolic (congestive) heart failure: Secondary | ICD-10-CM | POA: Diagnosis not present

## 2018-09-25 DIAGNOSIS — R918 Other nonspecific abnormal finding of lung field: Secondary | ICD-10-CM | POA: Diagnosis not present

## 2018-09-25 DIAGNOSIS — I700 Atherosclerosis of aorta: Secondary | ICD-10-CM | POA: Diagnosis not present

## 2018-09-25 DIAGNOSIS — M6281 Muscle weakness (generalized): Secondary | ICD-10-CM | POA: Diagnosis not present

## 2018-09-25 DIAGNOSIS — K5730 Diverticulosis of large intestine without perforation or abscess without bleeding: Secondary | ICD-10-CM | POA: Diagnosis not present

## 2018-09-25 HISTORY — DX: Disorder of kidney and ureter, unspecified: N28.9

## 2018-09-25 MED ORDER — IOPAMIDOL (ISOVUE-300) INJECTION 61%
100.0000 mL | Freq: Once | INTRAVENOUS | Status: AC | PRN
  Administered 2018-09-25: 100 mL via INTRAVENOUS

## 2018-09-25 NOTE — Telephone Encounter (Signed)
Ronalee Belts would like to get verbal order for 1 time once a week and then 2 times a week for 2 weeks for OT. Called and got his voicemail and left message giving permission to have OT.

## 2018-09-26 ENCOUNTER — Ambulatory Visit
Admission: RE | Admit: 2018-09-26 | Discharge: 2018-09-26 | Disposition: A | Discharge status: Home / Self Care | Payer: Medicare Other | Source: Ambulatory Visit | Attending: Radiation Oncology | Admitting: Radiation Oncology

## 2018-09-26 DIAGNOSIS — J449 Chronic obstructive pulmonary disease, unspecified: Secondary | ICD-10-CM | POA: Diagnosis not present

## 2018-09-26 DIAGNOSIS — Z510 Encounter for antineoplastic radiation therapy: Secondary | ICD-10-CM | POA: Diagnosis not present

## 2018-09-26 DIAGNOSIS — I130 Hypertensive heart and chronic kidney disease with heart failure and stage 1 through stage 4 chronic kidney disease, or unspecified chronic kidney disease: Secondary | ICD-10-CM | POA: Diagnosis not present

## 2018-09-26 DIAGNOSIS — R2689 Other abnormalities of gait and mobility: Secondary | ICD-10-CM | POA: Diagnosis not present

## 2018-09-26 DIAGNOSIS — I5022 Chronic systolic (congestive) heart failure: Secondary | ICD-10-CM | POA: Diagnosis not present

## 2018-09-26 DIAGNOSIS — M6281 Muscle weakness (generalized): Secondary | ICD-10-CM | POA: Diagnosis not present

## 2018-09-26 DIAGNOSIS — C3431 Malignant neoplasm of lower lobe, right bronchus or lung: Secondary | ICD-10-CM | POA: Diagnosis not present

## 2018-09-26 NOTE — Telephone Encounter (Signed)
I called OT and left message giving approval to go ahead with OT. There is telephone note to state what I did

## 2018-09-27 ENCOUNTER — Other Ambulatory Visit: Payer: Self-pay

## 2018-09-27 ENCOUNTER — Ambulatory Visit
Admission: RE | Admit: 2018-09-27 | Discharge: 2018-09-27 | Disposition: A | Discharge status: Home / Self Care | Payer: Medicare Other | Source: Ambulatory Visit | Attending: Radiation Oncology | Admitting: Radiation Oncology

## 2018-09-27 DIAGNOSIS — C3431 Malignant neoplasm of lower lobe, right bronchus or lung: Secondary | ICD-10-CM | POA: Diagnosis not present

## 2018-09-27 DIAGNOSIS — M6281 Muscle weakness (generalized): Secondary | ICD-10-CM | POA: Diagnosis not present

## 2018-09-27 DIAGNOSIS — I5022 Chronic systolic (congestive) heart failure: Secondary | ICD-10-CM | POA: Diagnosis not present

## 2018-09-27 DIAGNOSIS — J449 Chronic obstructive pulmonary disease, unspecified: Secondary | ICD-10-CM | POA: Diagnosis not present

## 2018-09-27 DIAGNOSIS — C3491 Malignant neoplasm of unspecified part of right bronchus or lung: Secondary | ICD-10-CM

## 2018-09-27 DIAGNOSIS — R918 Other nonspecific abnormal finding of lung field: Secondary | ICD-10-CM

## 2018-09-27 DIAGNOSIS — R2689 Other abnormalities of gait and mobility: Secondary | ICD-10-CM | POA: Diagnosis not present

## 2018-09-27 DIAGNOSIS — I130 Hypertensive heart and chronic kidney disease with heart failure and stage 1 through stage 4 chronic kidney disease, or unspecified chronic kidney disease: Secondary | ICD-10-CM | POA: Diagnosis not present

## 2018-09-27 DIAGNOSIS — Z510 Encounter for antineoplastic radiation therapy: Secondary | ICD-10-CM | POA: Diagnosis not present

## 2018-09-28 ENCOUNTER — Inpatient Hospital Stay: Payer: Medicare Other

## 2018-09-28 ENCOUNTER — Encounter: Payer: Self-pay | Admitting: Oncology

## 2018-09-28 ENCOUNTER — Inpatient Hospital Stay (HOSPITAL_BASED_OUTPATIENT_CLINIC_OR_DEPARTMENT_OTHER): Payer: Medicare Other | Admitting: Oncology

## 2018-09-28 ENCOUNTER — Ambulatory Visit
Admission: RE | Admit: 2018-09-28 | Discharge: 2018-09-28 | Disposition: A | Discharge status: Home / Self Care | Payer: Medicare Other | Source: Ambulatory Visit | Attending: Radiation Oncology | Admitting: Radiation Oncology

## 2018-09-28 ENCOUNTER — Telehealth: Payer: Self-pay | Admitting: Oncology

## 2018-09-28 ENCOUNTER — Ambulatory Visit: Payer: Medicare Other

## 2018-09-28 ENCOUNTER — Encounter: Payer: Self-pay | Admitting: *Deleted

## 2018-09-28 VITALS — BP 126/81 | HR 123 | Temp 97.4°F | Resp 18 | Ht 63.0 in | Wt 173.9 lb

## 2018-09-28 VITALS — BP 123/84 | HR 90 | Temp 96.8°F | Resp 18

## 2018-09-28 DIAGNOSIS — C3431 Malignant neoplasm of lower lobe, right bronchus or lung: Secondary | ICD-10-CM

## 2018-09-28 DIAGNOSIS — C3490 Malignant neoplasm of unspecified part of unspecified bronchus or lung: Secondary | ICD-10-CM

## 2018-09-28 DIAGNOSIS — R918 Other nonspecific abnormal finding of lung field: Secondary | ICD-10-CM

## 2018-09-28 DIAGNOSIS — Z5181 Encounter for therapeutic drug level monitoring: Secondary | ICD-10-CM

## 2018-09-28 DIAGNOSIS — Z510 Encounter for antineoplastic radiation therapy: Secondary | ICD-10-CM | POA: Diagnosis not present

## 2018-09-28 DIAGNOSIS — I5022 Chronic systolic (congestive) heart failure: Secondary | ICD-10-CM | POA: Diagnosis not present

## 2018-09-28 DIAGNOSIS — Z7901 Long term (current) use of anticoagulants: Secondary | ICD-10-CM

## 2018-09-28 DIAGNOSIS — I712 Thoracic aortic aneurysm, without rupture: Secondary | ICD-10-CM | POA: Diagnosis not present

## 2018-09-28 DIAGNOSIS — K8689 Other specified diseases of pancreas: Secondary | ICD-10-CM

## 2018-09-28 DIAGNOSIS — I719 Aortic aneurysm of unspecified site, without rupture: Secondary | ICD-10-CM

## 2018-09-28 DIAGNOSIS — J449 Chronic obstructive pulmonary disease, unspecified: Secondary | ICD-10-CM | POA: Diagnosis not present

## 2018-09-28 DIAGNOSIS — Z5111 Encounter for antineoplastic chemotherapy: Secondary | ICD-10-CM

## 2018-09-28 DIAGNOSIS — C3491 Malignant neoplasm of unspecified part of right bronchus or lung: Secondary | ICD-10-CM

## 2018-09-28 LAB — CBC WITH DIFFERENTIAL/PLATELET
ABS IMMATURE GRANULOCYTES: 0.05 10*3/uL (ref 0.00–0.07)
BASOS ABS: 0 10*3/uL (ref 0.0–0.1)
Basophils Relative: 0 %
EOS PCT: 0 %
Eosinophils Absolute: 0 10*3/uL (ref 0.0–0.5)
HCT: 31.6 % — ABNORMAL LOW (ref 39.0–52.0)
HEMOGLOBIN: 10.6 g/dL — AB (ref 13.0–17.0)
IMMATURE GRANULOCYTES: 1 %
Lymphocytes Relative: 4 %
Lymphs Abs: 0.4 10*3/uL — ABNORMAL LOW (ref 0.7–4.0)
MCH: 30.5 pg (ref 26.0–34.0)
MCHC: 33.5 g/dL (ref 30.0–36.0)
MCV: 91.1 fL (ref 80.0–100.0)
Monocytes Absolute: 1.1 10*3/uL — ABNORMAL HIGH (ref 0.1–1.0)
Monocytes Relative: 10 %
NEUTROS ABS: 9 10*3/uL — AB (ref 1.7–7.7)
NEUTROS PCT: 85 %
NRBC: 0 % (ref 0.0–0.2)
Platelets: 356 10*3/uL (ref 150–400)
RBC: 3.47 MIL/uL — AB (ref 4.22–5.81)
RDW: 14.6 % (ref 11.5–15.5)
WBC: 10.6 10*3/uL — AB (ref 4.0–10.5)

## 2018-09-28 LAB — COMPREHENSIVE METABOLIC PANEL
ALBUMIN: 3.1 g/dL — AB (ref 3.5–5.0)
ALK PHOS: 112 U/L (ref 38–126)
ALT: 13 U/L (ref 0–44)
ANION GAP: 11 (ref 5–15)
AST: 25 U/L (ref 15–41)
BILIRUBIN TOTAL: 1 mg/dL (ref 0.3–1.2)
BUN: 21 mg/dL (ref 8–23)
CO2: 29 mmol/L (ref 22–32)
Calcium: 9.2 mg/dL (ref 8.9–10.3)
Chloride: 89 mmol/L — ABNORMAL LOW (ref 98–111)
Creatinine, Ser: 0.95 mg/dL (ref 0.61–1.24)
GFR calc non Af Amer: >60 mL/min (ref >60)
Glucose, Bld: 115 mg/dL — ABNORMAL HIGH (ref 70–99)
POTASSIUM: 4.5 mmol/L (ref 3.5–5.1)
Sodium: 129 mmol/L — ABNORMAL LOW (ref 135–145)
Total Protein: 7 g/dL (ref 6.5–8.1)

## 2018-09-28 MED ORDER — FAMOTIDINE IN NACL 20-0.9 MG/50ML-% IV SOLN
20.0000 mg | Freq: Once | INTRAVENOUS | Status: AC
  Administered 2018-09-28: 20 mg via INTRAVENOUS
  Filled 2018-09-28: qty 50

## 2018-09-28 MED ORDER — PALONOSETRON HCL INJECTION 0.25 MG/5ML
0.2500 mg | Freq: Once | INTRAVENOUS | Status: AC
  Administered 2018-09-28: 0.25 mg via INTRAVENOUS
  Filled 2018-09-28: qty 5

## 2018-09-28 MED ORDER — SODIUM CHLORIDE 0.9 % IV SOLN
45.0000 mg/m2 | Freq: Once | INTRAVENOUS | Status: AC
  Administered 2018-09-28: 84 mg via INTRAVENOUS
  Filled 2018-09-28: qty 14

## 2018-09-28 MED ORDER — SODIUM CHLORIDE 0.9 % IV SOLN
182.6000 mg | Freq: Once | INTRAVENOUS | Status: AC
  Administered 2018-09-28: 180 mg via INTRAVENOUS
  Filled 2018-09-28: qty 18

## 2018-09-28 MED ORDER — SODIUM CHLORIDE 0.9 % IV SOLN
Freq: Once | INTRAVENOUS | Status: AC
  Administered 2018-09-28: 10:00:00 via INTRAVENOUS
  Filled 2018-09-28: qty 250

## 2018-09-28 MED ORDER — SODIUM CHLORIDE 0.9 % IV SOLN
20.0000 mg | Freq: Once | INTRAVENOUS | Status: AC
  Administered 2018-09-28: 20 mg via INTRAVENOUS
  Filled 2018-09-28: qty 2

## 2018-09-28 MED ORDER — HEPARIN SOD (PORK) LOCK FLUSH 100 UNIT/ML IV SOLN
500.0000 [IU] | Freq: Once | INTRAVENOUS | Status: AC | PRN
  Administered 2018-09-28: 500 [IU]
  Filled 2018-09-28: qty 5

## 2018-09-28 MED ORDER — DIPHENHYDRAMINE HCL 50 MG/ML IJ SOLN
50.0000 mg | Freq: Once | INTRAMUSCULAR | Status: AC
  Administered 2018-09-28: 50 mg via INTRAVENOUS
  Filled 2018-09-28: qty 1

## 2018-09-28 NOTE — Progress Notes (Signed)
1122: Taxol Paused, 2 liter O2 [place] started, [employer] aware of HR and Oxygen stats, pt states he has chronic AFIB.  1155: Per Judeen Hammans RN per Dr. Janese Banks. Okay to proceed with treatment, HR WNL and Pt is on O2 at home. Pt stable and denies any other symptoms at this time. Taxol restarted, pt assisted to restroom.  Pt tolerated infusion well. Pt stable at discharge.

## 2018-09-28 NOTE — Telephone Encounter (Signed)
Patient's wife called answering service, reports patient had first cycle chemotherapy today and has sweating. Wife is concerned. Request call back. I called patient at the number [phone] three times and no one pick up the phone.

## 2018-09-28 NOTE — Progress Notes (Signed)
  Oncology Nurse Navigator Documentation  Navigator Location: [facility]Med Onc (09/28/18 1000)   )Navigator Encounter Type: Treatment (09/28/18 1000)                   Treatment Initiated Date: 09/27/18 (09/28/18 1000) Patient Visit Type: MedOnc;RadOnc (09/28/18 1000) Treatment Phase: First Chemo Tx (09/28/18 1000) Barriers/Navigation Needs: No barriers at this time (09/28/18 1000)   Interventions: None required (09/28/18 1000)                      Time Spent with Patient: 30 (09/28/18 1000)

## 2018-09-28 NOTE — Progress Notes (Signed)
No new changes noted today 

## 2018-09-29 ENCOUNTER — Other Ambulatory Visit: Payer: Self-pay

## 2018-09-29 ENCOUNTER — Encounter: Payer: Self-pay | Admitting: Family Medicine

## 2018-09-29 ENCOUNTER — Ambulatory Visit
Admission: RE | Admit: 2018-09-29 | Discharge: 2018-09-29 | Disposition: A | Discharge status: Home / Self Care | Payer: Medicare Other | Source: Ambulatory Visit | Attending: Radiation Oncology | Admitting: Radiation Oncology

## 2018-09-29 ENCOUNTER — Ambulatory Visit (INDEPENDENT_AMBULATORY_CARE_PROVIDER_SITE_OTHER): Payer: Medicare Other | Admitting: Family Medicine

## 2018-09-29 VITALS — BP 114/74 | HR 107 | Temp 97.5°F | Ht 63.0 in | Wt 173.0 lb

## 2018-09-29 DIAGNOSIS — Z510 Encounter for antineoplastic radiation therapy: Secondary | ICD-10-CM | POA: Diagnosis not present

## 2018-09-29 DIAGNOSIS — Z952 Presence of prosthetic heart valve: Secondary | ICD-10-CM

## 2018-09-29 DIAGNOSIS — C3431 Malignant neoplasm of lower lobe, right bronchus or lung: Secondary | ICD-10-CM | POA: Insufficient documentation

## 2018-09-29 DIAGNOSIS — R609 Edema, unspecified: Secondary | ICD-10-CM

## 2018-09-29 DIAGNOSIS — I4891 Unspecified atrial fibrillation: Secondary | ICD-10-CM | POA: Diagnosis not present

## 2018-09-29 NOTE — Progress Notes (Signed)
Hematology/Oncology Consult note [facility]  Telephone:([phone] [phone]  Patient Care Team: Guadalupe Maple, MD as PCP - General (Family Medicine) Murlean Iba, MD (Internal Medicine) Isaias Cowman, MD as Consulting Physician (Cardiology) Christene Lye, MD (General Surgery) Telford Nab, RN as Registered Nurse   Name of the patient: Kirk Jones  [id number]  Apr 09, 1941   Date of visit: 09/29/18  Diagnosis- Squamous cell carcinoma of the lung Stage IIIA T3N1M0   Chief complaint/ Reason for visit-on treatment assessment prior to cycle 1 of weekly carbotaxol  Heme/Onc history: patient is a 77 year old Caucasian male with a past medical history significant for mechanical aortic valve replacement in 2004. He also has a history of chronic systolic congestive heart failure, ischemic cardiomyopathy, ventricular tachycardia status post ICD placement. He has a history of CVA in 2004 as well as hyperlipidemia and chronic kidney disease. He has had stent placements in the past and follows up with cardiology as well. He had an episode of ventricular tachycardia with ICD shock in August 2018. He has been a chronic smoker and states that he started smoking at the age of 77 quit smoking when he was 54. Patient presented to the ER with symptoms of worsening shortness of breath which led to a chest x-ray. Chest x-ray showed masslike density at the level of spine. This was followed by a CT chest without contrast. CT showed solid-appearing right lower lobe mass 6.6 x 5.6 cm in size extending towards the right hilum. Minimal mediastinal adenopathy with right infrahilar adenopathy was noted. Small nodular opacities throughout the right lung suspicious for metastases. Questionable single lesion noted on the left.   PET CT showed hypermetabolism in RLL abd possible subcarinal adenopathy. Ascending thoracic aneurysm 4.8 cm in diameter.  Subtle metabolic activity and calcifications in pancreatic head   Interval history-patient started his radiation treatment yesterday.  He is concerned about his leg swelling which is slowly getting worse.  Shortness of breath is at his baseline and he is using 2 L of oxygen at home on most occasions.  ECOG PS- 2 Pain scale- 0 Opioid associated constipation- no  Review of systems- Review of Systems  Constitutional: Negative for chills, fever, malaise/fatigue and weight loss.  HENT: Negative for congestion, ear discharge and nosebleeds.   Eyes: Negative for blurred vision.  Respiratory: Negative for cough, hemoptysis, sputum production, shortness of breath and wheezing.   Cardiovascular: Negative for chest pain, palpitations, orthopnea and claudication.  Gastrointestinal: Negative for abdominal pain, blood in stool, constipation, diarrhea, heartburn, melena, nausea and vomiting.  Genitourinary: Negative for dysuria, flank pain, frequency, hematuria and urgency.  Musculoskeletal: Negative for back pain, joint pain and myalgias.  Skin: Negative for rash.  Neurological: Negative for dizziness, tingling, focal weakness, seizures, weakness and headaches.  Endo/Heme/Allergies: Does not bruise/bleed easily.  Psychiatric/Behavioral: Negative for depression and suicidal ideas. The patient does not have insomnia.        Allergies  Allergen Reactions  . Prednisone Other (See Comments)    Reaction: "organs shutting down"     Past Medical History:  Diagnosis Date  . AICD (automatic cardioverter/defibrillator) present   . Aortic valve regurgitation   . Atrial fibrillation ([place])   . Barrett's esophagus   . Cancer ([place])    sate III lung  . CHF (congestive heart failure) ([place])   . COPD (chronic obstructive pulmonary disease) ([place])   . Diverticulosis   . GERD (gastroesophageal reflux disease)   . Gout   .  Headache    aura only  . Heart murmur   . Hematuria   . Hernia of abdominal  cavity   . History of prosthetic heart valve   . Hyperlipidemia   . Hypertension   . Nodular prostate with urinary obstruction   . Presence of permanent cardiac pacemaker   . Renal insufficiency   . Substance abuse ([place])    alcohol     Past Surgical History:  Procedure Laterality Date  . CARDIAC VALVE REPLACEMENT N/A 2004  . CARPAL TUNNEL RELEASE Right 2015  . CATARACT EXTRACTION Right 2017  . COLONOSCOPY WITH PROPOFOL N/A 2014  . CORONARY ANGIOPLASTY     stints x2  . EYE SURGERY     cross eyed  . FOOT SURGERY Bilateral    [place]  . FRACTURE SURGERY Right    [place]  . INGUINAL HERNIA REPAIR Right 09/16/2017   Procedure: HERNIA REPAIR INGUINAL ADULT;  Surgeon: Christene Lye, MD;  Location: [facility] ORS;  Service: General;  Laterality: Right;  . INSERT / REPLACE / [place]     ICD  . PACEMAKER IMPLANT Left 2013   and ICD  . PORTA CATH INSERTION N/A 09/21/2018   Procedure: PORTA CATH INSERTION;  Surgeon: Algernon Huxley, MD;  Location: [facility];  Service: Cardiovascular;  Laterality: N/A;  . VASECTOMY      Social History   Socioeconomic History  . Marital status: Married    Spouse name: Belenda Cruise   . Number of children: 5  . Years of education: Not on file  . Highest education level: Not on file  Occupational History  . Not on file  Social Needs  . Financial resource strain: Not hard at all  . Food insecurity:    Worry: Never true    Inability: Never true  . Transportation needs:    Medical: No    Non-medical: No  Tobacco Use  . Smoking status: Former Smoker    Types: Cigarettes    Last attempt to quit: 04/24/1991    Years since quitting: 27.4  . Smokeless tobacco: Never Used  Substance and Sexual Activity  . Alcohol use: Yes    Alcohol/week: 2.0 standard drinks    Types: 2 Shots of liquor per week    Comment: quit December 2015/restarted-2 drinks per night  . Drug use: No  . Sexual activity: Never  Lifestyle  . Physical activity:     Days per week: 0 days    Minutes per session: 0 min  . Stress: Not at all  Relationships  . Social connections:    Talks on phone: Once a week    Gets together: Once a week    Attends religious service: More than 4 times per year    Active member of club or organization: No    Attends meetings of clubs or organizations: Never    Relationship status: Married  . Intimate partner violence:    Fear of current or ex partner: No    Emotionally abused: No    Physically abused: No    Forced sexual activity: No  Other Topics Concern  . Not on file  Social History Narrative  . Not on file    Family History  Problem Relation Age of Onset  . Heart attack Mother   . Diabetes Maternal Grandmother   . Lung cancer Sister      Current Outpatient Medications:  .  acetaminophen (TYLENOL) 650 MG CR tablet, Take 650 mg by  mouth 2 (two) times daily., Disp: , Rfl:  .  aspirin EC 81 MG tablet, Take 81 mg by mouth daily., Disp: , Rfl:  .  COLCRYS 0.6 MG tablet, TAKE 1 TABLET BY MOUTH ONCE DAILY, Disp: 30 tablet, Rfl: 1 .  diltiazem (CARDIZEM CD) 180 MG 24 hr capsule, , Disp: , Rfl: 3 .  diltiazem (DILACOR XR) 180 MG 24 hr capsule, Take 180 mg by mouth daily, Disp: 90 capsule, Rfl: 4 .  empagliflozin (JARDIANCE) 10 MG TABS tablet, Take 10 mg by mouth daily., Disp: , Rfl:  .  fexofenadine (ALLEGRA) 180 MG tablet, Take 180 mg by mouth daily., Disp: , Rfl:  .  finasteride (PROSCAR) 5 MG tablet, TAKE 1 TABLET BY MOUTH ONCE DAILY, Disp: 90 tablet, Rfl: 2 .  furosemide (LASIX) 40 MG tablet, Take 40 mg by mouth daily. , Disp: , Rfl:  .  losartan (COZAAR) 25 MG tablet, Take 1 tablet (25 mg total) by mouth daily., Disp: 90 tablet, Rfl: 4 .  Magnesium 400 MG TABS, Take 400 mg by mouth daily. , Disp: , Rfl:  .  metoprolol succinate (TOPROL-XL) 50 MG 24 hr tablet, TAKE 1 TABLET BY MOUTH TWICE DAILY WITH MEALS OR IMMEDIATELY FOLLOWING MEAL, Disp: 180 tablet, Rfl: 4 .  Multiple Vitamins-Minerals (CENTRUM  ADULTS PO), Take 1 tablet by mouth daily. , Disp: , Rfl:  .  omeprazole (PRILOSEC) 20 MG capsule, Take 1 capsule (20 mg total) by mouth daily., Disp: 90 capsule, Rfl: 4 .  tamsulosin (FLOMAX) 0.4 MG CAPS capsule, Take 2 capsules (0.8 mg total) by mouth daily., Disp: 90 capsule, Rfl: 4 .  warfarin (COUMADIN) 3 MG tablet, TAKE 1 TABLET BY MOUTH ONCE DAILY., Disp: 30 tablet, Rfl: 2 .  desonide (DESOWEN) 0.05 % cream, Apply topically 2 (two) times daily., Disp: 60 g, Rfl: 0 .  dexamethasone (DECADRON) 4 MG tablet, Take 2 tablets (8 mg total) by mouth daily. Start the day after chemotherapy for 2 days., Disp: 30 tablet, Rfl: 1 .  fluticasone (FLONASE) 50 MCG/ACT nasal spray, Place 2 sprays daily into both nostrils., Disp: 16 g, Rfl: 12 .  ketoconazole (NIZORAL) 2 % shampoo, Apply 1 application topically 2 (two) times a week., Disp: 120 mL, Rfl: 2 .  lidocaine-prilocaine (EMLA) cream, Apply to affected area once, Disp: 30 g, Rfl: 3 .  loratadine (CLARITIN) 10 MG tablet, Take 1 tablet (10 mg total) by mouth daily., Disp: 90 tablet, Rfl: 3 .  LORazepam (ATIVAN) 0.5 MG tablet, Take 1 tablet (0.5 mg total) by mouth every 6 (six) hours as needed (Nausea or vomiting)., Disp: 30 tablet, Rfl: 0 .  ondansetron (ZOFRAN) 8 MG tablet, Take 1 tablet (8 mg total) by mouth 2 (two) times daily as needed for refractory nausea / vomiting. Start on day 3 after chemo., Disp: 30 tablet, Rfl: 1 .  Polyethyl Glycol-Propyl Glycol (SYSTANE OP), Place 1 drop into both eyes as needed (for dry eyes)., Disp: , Rfl:  .  prochlorperazine (COMPAZINE) 10 MG tablet, Take 1 tablet (10 mg total) by mouth every 6 (six) hours as needed (Nausea or vomiting)., Disp: 30 tablet, Rfl: 1 .  tiotropium (SPIRIVA) 18 MCG inhalation capsule, Place 1 capsule (18 mcg total) into inhaler and inhale daily., Disp: 90 capsule, Rfl: 4 .  UNABLE TO FIND, CBD Hemp topical cream, Disp: , Rfl:  .  warfarin (COUMADIN) 1 MG tablet, TAKE 1 TABLET BY MOUTH ONCE DAILY  (Patient not taking: Reported on 08/25/2018), Disp: 30  tablet, Rfl: 12 .  warfarin (COUMADIN) 4 MG tablet, TAKE 1 TABLET BY MOUTH ONCE DAILY AT 6 P.M. (Patient not taking: Reported on 08/25/2018), Disp: 30 tablet, Rfl: 3 No current facility-administered medications for this visit.   Facility-Administered Medications Ordered in Other Visits:  .  0.9 %  sodium chloride infusion, , Intravenous, Continuous, Antionette Char  Physical exam:  Vitals:   09/28/18 0928  BP: 126/81  Pulse: (!) 123  Resp: 18  Temp: (!) 97.4 F (36.3 C)  TempSrc: Tympanic  SpO2: 91%  Weight: 173 lb 14.4 oz (78.9 kg)  Height: 5\' 3"  (1.6 m)   Physical Exam  Constitutional: He is oriented to person, place, and time.  Elderly gentleman sitting in a wheelchair.  He appears in no acute distress  HENT:  Head: Normocephalic and atraumatic.  Eyes: Pupils are equal, round, and reactive to light. EOM are normal.  Neck: Normal range of motion.  Cardiovascular: Regular rhythm and normal heart sounds.  tachycardic  Pulmonary/Chest: Effort normal and breath sounds normal.  Abdominal: Soft. Bowel sounds are normal.  Musculoskeletal: He exhibits edema.  Neurological: He is alert and oriented to person, place, and time.  Skin: Skin is warm and dry.     CMP Latest Ref Rng & Units 09/28/2018  Glucose 70 - 99 mg/dL 115(H)  BUN 8 - 23 mg/dL 21  Creatinine 0.61 - 1.24 mg/dL 0.95  Sodium 135 - 145 mmol/L 129(L)  Potassium 3.5 - 5.1 mmol/L 4.5  Chloride 98 - 111 mmol/L 89(L)  CO2 22 - 32 mmol/L 29  Calcium 8.9 - 10.3 mg/dL 9.2  Total Protein 6.5 - 8.1 g/dL 7.0  Total Bilirubin 0.3 - 1.2 mg/dL 1.0  Alkaline Phos 38 - 126 U/L 112  AST 15 - 41 U/L 25  ALT 0 - 44 U/L 13   CBC Latest Ref Rng & Units 09/28/2018  WBC 4.0 - 10.5 K/uL 10.6(H)  Hemoglobin 13.0 - 17.0 g/dL 10.6(L)  Hematocrit 39.0 - 52.0 % 31.6(L)  Platelets 150 - 400 K/uL 356    No images are attached to the encounter.  Ct Head W Wo Contrast  Result  Date: 09/04/2018 CLINICAL DATA:  Lung mass.  Staging for metastatic disease. EXAM: CT HEAD WITHOUT AND WITH CONTRAST TECHNIQUE: Contiguous axial images were obtained from the base of the skull through the vertex without and with intravenous contrast CONTRAST:  43mL OMNIPAQUE IOHEXOL 300 MG/ML  SOLN COMPARISON:  CT head 06/13/2017 FINDINGS: Brain: Moderate atrophy. Mild chronic microvascular ischemic change in the white matter. Negative for acute infarct, hemorrhage, or mass. Normal enhancement postcontrast infusion. No enhancing mass lesion identified. Vascular: Atherosclerotic calcification, advanced. Negative for hyperdense vessel Skull: Negative Sinuses/Orbits: Bilateral cataract surgery. Visualized paranasal sinuses clear. Other: None IMPRESSION: No acute abnormality.  Negative for metastatic disease to the brain Atrophy and chronic microvascular ischemia. Electronically Signed   By: Franchot Gallo M.D.   On: 09/04/2018 13:19   Ct Guided Needle Placement  Result Date: 09/08/2018 CLINICAL DATA:  Hypermetabolic right lower lobe lung lesion with effusion. Shortness of breath. EXAM: CT GUIDED PARACENTESIS CT-GUIDED CORE BIOPSY OF RIGHT LOWER LOBE LUNG LESION ANESTHESIA/SEDATION: Intravenous Fentanyl and Versed were administered as conscious sedation during continuous monitoring of the patient's level of consciousness and physiological / cardiorespiratory status by the radiology RN, with a total moderate sedation time of 24 minutes. PROCEDURE: The procedure risks, benefits, and alternatives were explained to the patient. Questions regarding the procedure were encouraged and answered. The patient  understands and consents to the procedure. patient placed left lateral decubitus. select axial scans through the thorax obtained. Lesion and moderate effusion or localized. The skin entry site for diagnostic and therapeutic for thoracentesis was selected. The operative field was prepped with chlorhexidinein a sterile  fashion, and a sterile drape was applied covering the operative field. A sterile gown and sterile gloves were used for the procedure. Local anesthesia was provided with 1% Lidocaine. A Safe-T-Centesis needle was advanced into the right pleural space until fluid could be aspirated. 1.2 L of clear yellow fluid were removed. The patient tolerated the procedure well. Fluid sent for cytology. In similar fashion, skin entry site for biopsy of the right lower lobe lesion was selected and marked. The operative field was prepped with chlorhexidinein a sterile fashion, and a sterile drape was applied covering the operative field. A sterile gown and sterile gloves were used for the procedure. Local anesthesia was provided with 1% Lidocaine. Under CT fluoroscopic guidance, a 17 gauge trocar needle was advanced to the margin of the lesion. Once needle tip position was confirmed, coaxial 18-gauge core biopsy samples were obtained, submitted in formalin to surgical pathology. The guide needle was removed. Postprocedure scans show no pneumothorax or hemorrhage. Significant decrease in right pleural effusion. COMPLICATIONS: None immediate FINDINGS: Limited CT demonstrates moderate right pleural effusion and again we demonstrates the right lower lobe mass. 1.2 L right pleural fluid were aspirated and sent for cytology analysis. Core biopsy samples of the right lower lobe lesion obtained as above. The patient tolerated the procedure well. IMPRESSION: 1. Technically successful CT-guided right thoracentesis removing 1.2 L. 2. Technically successful CT-guided right lower lobe lung lesion core biopsy. Electronically Signed   By: Lucrezia Europe M.D.   On: 09/08/2018 15:17   Ct Abdomen W Wo Contrast  Result Date: 09/25/2018 CLINICAL DATA:  Evaluate pancreatic calcifications EXAM: CT ABDOMEN WITHOUT AND WITH CONTRAST TECHNIQUE: Multidetector CT imaging of the abdomen was performed following the standard protocol before and following the  bolus administration of intravenous contrast. CONTRAST:  129mL ISOVUE-300 IOPAMIDOL (ISOVUE-300) INJECTION 61% COMPARISON:  PET-CT 08/30/2018. FINDINGS: Lower chest: Large right lower lobe lung mass is again identified measuring 7.9 cm. Loculated pleural fluid is identified overlying the right midlung and right lower lobe. Small left pleural effusion noted. Aortic atherosclerosis is identified. Hepatobiliary: No focal liver abnormality. The gallbladder is not confidently identified and may be surgically absent. Pancreas: No pancreatic inflammation identified faint punctate areas of mild hyperattenuation within the head of pancreas are again identified within head of pancreas, image 39/2 and may represent small calcifications. 1.4 cm low-density structure within the head of pancreas is identified measuring 32 HU, image 96/14. This is favored to represent a mildly dilated common bile duct. This roughly corresponds to the areas of calcification within the head of pancreas which may reflect choledocholithiasis. Body and tail of pancreas appear normal. Spleen: Spleen normal. Adrenals/Urinary Tract: Unremarkable appearance of the adrenal glands. There is a cyst arising from inferior pole of right kidney measuring 3 cm, image 46/11. Smaller cysts noted within the upper pole of the right kidney. Multiple areas of cortical scarring involve the left kidney. Stomach/Bowel: Stomach and small bowel loops have a normal course and caliber. Extensive colonic diverticulosis noted. Vascular/Lymphatic: Aortic atherosclerosis. No aneurysm. No adenopathy identified within the abdomen. Other: Trace free fluid identified along the pericolic gutters. Musculoskeletal: Mild thoraco lumbar scoliosis and multilevel degenerative disc disease. IMPRESSION: 1. Faint calcifications within head of pancreas are again noted.  There is cystic area of low attenuation within the head of pancreas which may represent a mildly dilated common bile duct.  Calcifications may represent choledocholithiasis. In a patient who cannot have an MRI due to pacer device ERCP may be helpful to confirm common bile duct stones. 2. Large right lower lobe lung mass with surrounding loculated pleural fluid 3.  Aortic Atherosclerosis (ICD10-I70.0). Electronically Signed   By: Kerby Moors M.D.   On: 09/25/2018 16:27   Ct Biopsy  Result Date: 09/08/2018 CLINICAL DATA:  Hypermetabolic right lower lobe lung lesion with effusion. Shortness of breath. EXAM: CT GUIDED PARACENTESIS CT-GUIDED CORE BIOPSY OF RIGHT LOWER LOBE LUNG LESION ANESTHESIA/SEDATION: Intravenous Fentanyl and Versed were administered as conscious sedation during continuous monitoring of the patient's level of consciousness and physiological / cardiorespiratory status by the radiology RN, with a total moderate sedation time of 24 minutes. PROCEDURE: The procedure risks, benefits, and alternatives were explained to the patient. Questions regarding the procedure were encouraged and answered. The patient understands and consents to the procedure. patient placed left lateral decubitus. select axial scans through the thorax obtained. Lesion and moderate effusion or localized. The skin entry site for diagnostic and therapeutic for thoracentesis was selected. The operative field was prepped with chlorhexidinein a sterile fashion, and a sterile drape was applied covering the operative field. A sterile gown and sterile gloves were used for the procedure. Local anesthesia was provided with 1% Lidocaine. A Safe-T-Centesis needle was advanced into the right pleural space until fluid could be aspirated. 1.2 L of clear yellow fluid were removed. The patient tolerated the procedure well. Fluid sent for cytology. In similar fashion, skin entry site for biopsy of the right lower lobe lesion was selected and marked. The operative field was prepped with chlorhexidinein a sterile fashion, and a sterile drape was applied covering the  operative field. A sterile gown and sterile gloves were used for the procedure. Local anesthesia was provided with 1% Lidocaine. Under CT fluoroscopic guidance, a 17 gauge trocar needle was advanced to the margin of the lesion. Once needle tip position was confirmed, coaxial 18-gauge core biopsy samples were obtained, submitted in formalin to surgical pathology. The guide needle was removed. Postprocedure scans show no pneumothorax or hemorrhage. Significant decrease in right pleural effusion. COMPLICATIONS: None immediate FINDINGS: Limited CT demonstrates moderate right pleural effusion and again we demonstrates the right lower lobe mass. 1.2 L right pleural fluid were aspirated and sent for cytology analysis. Core biopsy samples of the right lower lobe lesion obtained as above. The patient tolerated the procedure well. IMPRESSION: 1. Technically successful CT-guided right thoracentesis removing 1.2 L. 2. Technically successful CT-guided right lower lobe lung lesion core biopsy. Electronically Signed   By: Lucrezia Europe M.D.   On: 09/08/2018 15:17     Assessment and plan- Patient is a 78 y.o. male with Stage IIIA squamous cell carcinoma of the lung T3N1M0.  He is here for on treatment assessment prior to cycle 1 of weekly carbotaxol chemotherapy  1.  Counts okay to proceed with cycle 1 of weekly carbotaxol chemotherapy today.  He started radiation yesterday and is likely going to receive radiation into different sessions of 4 weeks and 3 weeks respectively.  I will see him back in 1 week's time with CBC and CMP for cycle 2 of weekly carbotaxol chemotherapy  2.  Leg swelling: Likely secondary to underlying cardiac issues and I have asked him to speak to his cardiologist about it  3.  Patient  was also noted to have some pancreatic calcifications on PET CT scan.  Patient could not have MRI done due to pacer device and hence underwent CT abdomen pancreatic mass protocol which again demonstrates calcifications of  his pancreatic head and a cystic area in the head of the pancreas.  He will need ERCP for further evaluation.  I will hold off on that at this time until he finishes his chemoradiation for his lung cancer and consider with down the line.  His AST ALT and total bilirubin are normal today  4.  With regards to the ascending thoracic aneurysm: I will refer him to vascular surgery for further evaluation    Visit Diagnosis 1. Malignant neoplasm of lung, unspecified laterality, unspecified part of lung ([place])   2. Encounter for monitoring Coumadin therapy   3. Aortic aneurysm without rupture, unspecified portion of aorta ([place])   4. Encounter for antineoplastic chemotherapy      Dr. Randa Evens, MD, MPH [facility] [phone] 09/29/2018 12:10 PM

## 2018-09-29 NOTE — Progress Notes (Signed)
BP 114/74   Pulse (!) 107   Temp (!) 97.5 F (36.4 C) (Oral)   Ht 5\' 3"  (1.6 m)   Wt 173 lb (78.5 kg)   SpO2 95%   BMI 30.65 kg/m    Subjective:    [name], male    DOB: [birth year], 77 y.o.   MRN: [id number]  HPI: Kirk Jones is a 77 y.o. male  Chief Complaint  Patient presents with  . Coagulation Disorder   This patient is a 77 y.o. male who presents for coumadin management. The expected duration of coumadin treatment is lifelong The reason for anticoagulation is  mechanical heart valve. Had his first round of chemo yesterday  Present Coumadin dose: 3mg  daily- had been holding meds for 7 days, has only been back on it 2-3 days Goal: 2.5-3.5  Excessive bruising: no Nose bleeding: no Rectal bleeding: no Prolonged menstrual cycles: N/A Eating diet with consistent amounts of foods containing Vitamin K:yes Any recent antibiotic use? no  Has been having swelling in his legs and L arm. Has been sleeping in the recliner. Stopped using his compression stockings because he thought someone told him to. No pain. Some weeping from the leg. No SOB. Otherwise feeling well.   Relevant past medical, surgical, family and social history reviewed and updated as indicated. Interim medical history since our last visit reviewed. Allergies and medications reviewed and updated.  Review of Systems  Constitutional: Negative.   Respiratory: Negative.   Cardiovascular: Positive for leg swelling. Negative for chest pain and palpitations.  Neurological: Negative.   Psychiatric/Behavioral: Negative.     Per HPI unless specifically indicated above     Objective:    BP 114/74   Pulse (!) 107   Temp (!) 97.5 F (36.4 C) (Oral)   Ht 5\' 3"  (1.6 m)   Wt 173 lb (78.5 kg)   SpO2 95%   BMI 30.65 kg/m   Wt Readings from Last 3 Encounters:  09/29/18 173 lb (78.5 kg)  09/28/18 173 lb 14.4 oz (78.9 kg)  09/21/18 167 lb (75.8 kg)    Physical Exam  Constitutional:  He is oriented to person, place, and time. He appears well-developed and well-nourished. No distress.  HENT:  Head: Normocephalic and atraumatic.  Right Ear: Hearing normal.  Left Ear: Hearing normal.  Nose: Nose normal.  Eyes: Conjunctivae and lids are normal. Right eye exhibits no discharge. Left eye exhibits no discharge. No scleral icterus.  Cardiovascular: Normal rate, regular rhythm, normal heart sounds and intact distal pulses. Exam reveals no gallop and no friction rub.  No murmur heard. Pulmonary/Chest: Effort normal and breath sounds normal. No stridor. No respiratory distress. He has no wheezes. He has no rales. He exhibits no tenderness.  Musculoskeletal: Normal range of motion. He exhibits edema (3+ edema bilaterally and L hand, negative homan's and sqeeze). He exhibits no tenderness or deformity.  Neurological: He is alert and oriented to person, place, and time.  Skin: Skin is warm, dry and intact. Capillary refill takes less than 2 seconds. No rash noted. No erythema. No pallor.  Psychiatric: He has a normal mood and affect. His speech is normal and behavior is normal. Judgment and thought content normal. Cognition and memory are normal.  Nursing note and vitals reviewed.   Results for orders placed or performed in visit on 09/28/18  Comprehensive metabolic panel  Result Value Ref Range   Sodium 129 (L) 135 - 145 mmol/L   Potassium 4.5  3.5 - 5.1 mmol/L   Chloride 89 (L) 98 - 111 mmol/L   CO2 29 22 - 32 mmol/L   Glucose, Bld 115 (H) 70 - 99 mg/dL   BUN 21 8 - 23 mg/dL   Creatinine, Ser 0.95 0.61 - 1.24 mg/dL   Calcium 9.2 8.9 - 10.3 mg/dL   Total Protein 7.0 6.5 - 8.1 g/dL   Albumin 3.1 (L) 3.5 - 5.0 g/dL   AST 25 15 - 41 U/L   ALT 13 0 - 44 U/L   Alkaline Phosphatase 112 38 - 126 U/L   Total Bilirubin 1.0 0.3 - 1.2 mg/dL   GFR calc non Af Amer >60 >60 mL/min   GFR calc Af Amer >60 >60 mL/min   Anion gap 11 5 - 15  CBC with Differential/Platelet  Result Value Ref  Range   WBC 10.6 (H) 4.0 - 10.5 K/uL   RBC 3.47 (L) 4.22 - 5.81 MIL/uL   Hemoglobin 10.6 (L) 13.0 - 17.0 g/dL   HCT 31.6 (L) 39.0 - 52.0 %   MCV 91.1 80.0 - 100.0 fL   MCH 30.5 26.0 - 34.0 pg   MCHC 33.5 30.0 - 36.0 g/dL   RDW 14.6 11.5 - 15.5 %   Platelets 356 150 - 400 K/uL   nRBC 0.0 0.0 - 0.2 %   Neutrophils Relative % 85 %   Neutro Abs 9.0 (H) 1.7 - 7.7 K/uL   Lymphocytes Relative 4 %   Lymphs Abs 0.4 (L) 0.7 - 4.0 K/uL   Monocytes Relative 10 %   Monocytes Absolute 1.1 (H) 0.1 - 1.0 K/uL   Eosinophils Relative 0 %   Eosinophils Absolute 0.0 0.0 - 0.5 K/uL   Basophils Relative 0 %   Basophils Absolute 0.0 0.0 - 0.1 K/uL   Immature Granulocytes 1 %   Abs Immature Granulocytes 0.05 0.00 - 0.07 K/uL      Assessment & Plan:   Problem List Items Addressed This Visit      Cardiovascular and Mediastinum   Atrial fibrillation (HCC) - Primary (Chronic)    INR 1.9 has only been back on his coumadin 2-3 days. Will continue current regimen and recheck 2 weeks. Call with any concerns.       Relevant Orders   CoaguChek XS/INR Waived     Other   History of prosthetic heart valve (Chronic)    INR 1.9 has only been back on his coumadin 2-3 days. Will continue current regimen and recheck 2 weeks. Call with any concerns.        Other Visit Diagnoses    Peripheral edema       Elevation and compression stockings. If not doing better on Tuesday- will consider increasing his lasix as long as his sodium is better.        Follow up plan: Return in about 4 weeks (around 10/27/2018).

## 2018-09-29 NOTE — Assessment & Plan Note (Signed)
INR 1.9 has only been back on his coumadin 2-3 days. Will continue current regimen and recheck 2 weeks. Call with any concerns.

## 2018-09-30 LAB — COAGUCHEK XS/INR WAIVED
INR: 1.9 — AB (ref 0.9–1.1)
Prothrombin Time: 22.2 s

## 2018-10-02 ENCOUNTER — Ambulatory Visit
Admission: RE | Admit: 2018-10-02 | Discharge: 2018-10-02 | Disposition: A | Discharge status: Home / Self Care | Payer: Medicare Other | Source: Ambulatory Visit | Attending: Radiation Oncology | Admitting: Radiation Oncology

## 2018-10-02 ENCOUNTER — Other Ambulatory Visit: Payer: Self-pay

## 2018-10-02 DIAGNOSIS — I6300 Cerebral infarction due to thrombosis of unspecified precerebral artery: Secondary | ICD-10-CM | POA: Diagnosis not present

## 2018-10-02 DIAGNOSIS — N183 Chronic kidney disease, stage 3 (moderate): Secondary | ICD-10-CM | POA: Diagnosis not present

## 2018-10-02 DIAGNOSIS — I480 Paroxysmal atrial fibrillation: Secondary | ICD-10-CM | POA: Diagnosis not present

## 2018-10-02 DIAGNOSIS — Z510 Encounter for antineoplastic radiation therapy: Secondary | ICD-10-CM | POA: Diagnosis not present

## 2018-10-02 DIAGNOSIS — I10 Essential (primary) hypertension: Secondary | ICD-10-CM | POA: Diagnosis not present

## 2018-10-02 DIAGNOSIS — Z95810 Presence of automatic (implantable) cardiac defibrillator: Secondary | ICD-10-CM | POA: Diagnosis not present

## 2018-10-02 DIAGNOSIS — I5022 Chronic systolic (congestive) heart failure: Secondary | ICD-10-CM | POA: Diagnosis not present

## 2018-10-02 DIAGNOSIS — I2510 Atherosclerotic heart disease of native coronary artery without angina pectoris: Secondary | ICD-10-CM | POA: Diagnosis not present

## 2018-10-02 DIAGNOSIS — C3491 Malignant neoplasm of unspecified part of right bronchus or lung: Secondary | ICD-10-CM

## 2018-10-02 DIAGNOSIS — Z952 Presence of prosthetic heart valve: Secondary | ICD-10-CM | POA: Diagnosis not present

## 2018-10-02 DIAGNOSIS — C3431 Malignant neoplasm of lower lobe, right bronchus or lung: Secondary | ICD-10-CM | POA: Diagnosis not present

## 2018-10-02 DIAGNOSIS — I472 Ventricular tachycardia: Secondary | ICD-10-CM | POA: Diagnosis not present

## 2018-10-02 DIAGNOSIS — J449 Chronic obstructive pulmonary disease, unspecified: Secondary | ICD-10-CM | POA: Diagnosis not present

## 2018-10-02 DIAGNOSIS — R918 Other nonspecific abnormal finding of lung field: Secondary | ICD-10-CM | POA: Diagnosis not present

## 2018-10-03 ENCOUNTER — Ambulatory Visit
Admission: RE | Admit: 2018-10-03 | Discharge: 2018-10-03 | Disposition: A | Discharge status: Home / Self Care | Payer: Medicare Other | Source: Ambulatory Visit | Attending: Radiation Oncology | Admitting: Radiation Oncology

## 2018-10-03 DIAGNOSIS — C3431 Malignant neoplasm of lower lobe, right bronchus or lung: Secondary | ICD-10-CM | POA: Diagnosis not present

## 2018-10-03 DIAGNOSIS — Z510 Encounter for antineoplastic radiation therapy: Secondary | ICD-10-CM | POA: Diagnosis not present

## 2018-10-04 ENCOUNTER — Telehealth: Payer: Self-pay | Admitting: *Deleted

## 2018-10-04 ENCOUNTER — Ambulatory Visit
Admission: RE | Admit: 2018-10-04 | Discharge: 2018-10-04 | Disposition: A | Discharge status: Home / Self Care | Payer: Medicare Other | Source: Ambulatory Visit | Attending: Radiation Oncology | Admitting: Radiation Oncology

## 2018-10-04 DIAGNOSIS — I5022 Chronic systolic (congestive) heart failure: Secondary | ICD-10-CM | POA: Diagnosis not present

## 2018-10-04 DIAGNOSIS — I130 Hypertensive heart and chronic kidney disease with heart failure and stage 1 through stage 4 chronic kidney disease, or unspecified chronic kidney disease: Secondary | ICD-10-CM | POA: Diagnosis not present

## 2018-10-04 DIAGNOSIS — R2689 Other abnormalities of gait and mobility: Secondary | ICD-10-CM | POA: Diagnosis not present

## 2018-10-04 DIAGNOSIS — Z510 Encounter for antineoplastic radiation therapy: Secondary | ICD-10-CM | POA: Diagnosis not present

## 2018-10-04 DIAGNOSIS — C3431 Malignant neoplasm of lower lobe, right bronchus or lung: Secondary | ICD-10-CM | POA: Diagnosis not present

## 2018-10-04 DIAGNOSIS — M6281 Muscle weakness (generalized): Secondary | ICD-10-CM | POA: Diagnosis not present

## 2018-10-04 DIAGNOSIS — J449 Chronic obstructive pulmonary disease, unspecified: Secondary | ICD-10-CM | POA: Diagnosis not present

## 2018-10-04 NOTE — Telephone Encounter (Signed)
Kirk Jones physical Therapist called to report that patient ambulated 15 feet and his O2 dropped to the mid 80's on room air, once his O2 was on, it came back up. His HR was >110 and his temp 99.7. Please advise if anything needs to be done for patient.

## 2018-10-04 NOTE — Telephone Encounter (Signed)
Nothing to do at this point. Patient can check his resting heart rate. If it is consistently >100, he needs to call his cardiologist

## 2018-10-04 NOTE — Telephone Encounter (Signed)
Call returned to [facility] and he reports that HR has come back down now

## 2018-10-05 ENCOUNTER — Encounter: Payer: Self-pay | Admitting: Oncology

## 2018-10-05 ENCOUNTER — Inpatient Hospital Stay (HOSPITAL_BASED_OUTPATIENT_CLINIC_OR_DEPARTMENT_OTHER): Payer: Medicare Other | Admitting: Oncology

## 2018-10-05 ENCOUNTER — Other Ambulatory Visit: Payer: Self-pay | Admitting: *Deleted

## 2018-10-05 ENCOUNTER — Inpatient Hospital Stay: Payer: Medicare Other | Attending: Oncology

## 2018-10-05 ENCOUNTER — Ambulatory Visit: Payer: Medicare Other

## 2018-10-05 ENCOUNTER — Inpatient Hospital Stay: Payer: Medicare Other

## 2018-10-05 VITALS — BP 93/56 | HR 109 | Temp 98.2°F | Resp 18 | Ht 63.0 in | Wt 164.5 lb

## 2018-10-05 DIAGNOSIS — E876 Hypokalemia: Secondary | ICD-10-CM

## 2018-10-05 DIAGNOSIS — E871 Hypo-osmolality and hyponatremia: Secondary | ICD-10-CM | POA: Insufficient documentation

## 2018-10-05 DIAGNOSIS — C3491 Malignant neoplasm of unspecified part of right bronchus or lung: Secondary | ICD-10-CM

## 2018-10-05 DIAGNOSIS — I712 Thoracic aortic aneurysm, without rupture: Secondary | ICD-10-CM | POA: Insufficient documentation

## 2018-10-05 DIAGNOSIS — N179 Acute kidney failure, unspecified: Secondary | ICD-10-CM | POA: Insufficient documentation

## 2018-10-05 DIAGNOSIS — Z5111 Encounter for antineoplastic chemotherapy: Secondary | ICD-10-CM

## 2018-10-05 LAB — COMPREHENSIVE METABOLIC PANEL
ALBUMIN: 2.8 g/dL — AB (ref 3.5–5.0)
ALT: 16 U/L (ref 0–44)
AST: 28 U/L (ref 15–41)
Alkaline Phosphatase: 86 U/L (ref 38–126)
Anion gap: 10 (ref 5–15)
BUN: 36 mg/dL — AB (ref 8–23)
CHLORIDE: 83 mmol/L — AB (ref 98–111)
CO2: 35 mmol/L — AB (ref 22–32)
Calcium: 8.5 mg/dL — ABNORMAL LOW (ref 8.9–10.3)
Creatinine, Ser: 1.18 mg/dL (ref 0.61–1.24)
GFR calc Af Amer: >60 mL/min (ref >60)
GFR calc non Af Amer: 58 mL/min — ABNORMAL LOW (ref >60)
GLUCOSE: 154 mg/dL — AB (ref 70–99)
Potassium: 3 mmol/L — ABNORMAL LOW (ref 3.5–5.1)
SODIUM: 128 mmol/L — AB (ref 135–145)
Total Bilirubin: 1.3 mg/dL — ABNORMAL HIGH (ref 0.3–1.2)
Total Protein: 6.2 g/dL — ABNORMAL LOW (ref 6.5–8.1)

## 2018-10-05 LAB — CBC WITH DIFFERENTIAL/PLATELET
ABS IMMATURE GRANULOCYTES: 0.1 10*3/uL — AB (ref 0.00–0.07)
BASOS PCT: 0 %
Basophils Absolute: 0 10*3/uL (ref 0.0–0.1)
Eosinophils Absolute: 0 10*3/uL (ref 0.0–0.5)
Eosinophils Relative: 0 %
HEMATOCRIT: 30 % — AB (ref 39.0–52.0)
Hemoglobin: 10.2 g/dL — ABNORMAL LOW (ref 13.0–17.0)
Immature Granulocytes: 1 %
Lymphocytes Relative: 2 %
Lymphs Abs: 0.2 10*3/uL — ABNORMAL LOW (ref 0.7–4.0)
MCH: 30.8 pg (ref 26.0–34.0)
MCHC: 34 g/dL (ref 30.0–36.0)
MCV: 90.6 fL (ref 80.0–100.0)
MONO ABS: 0.4 10*3/uL (ref 0.1–1.0)
MONOS PCT: 4 %
Neutro Abs: 8.7 10*3/uL — ABNORMAL HIGH (ref 1.7–7.7)
Neutrophils Relative %: 93 %
Platelets: 280 10*3/uL (ref 150–400)
RBC: 3.31 MIL/uL — ABNORMAL LOW (ref 4.22–5.81)
RDW: 14.6 % (ref 11.5–15.5)
WBC: 9.4 10*3/uL (ref 4.0–10.5)
nRBC: 0 % (ref 0.0–0.2)

## 2018-10-05 MED ORDER — POTASSIUM CHLORIDE CRYS ER 20 MEQ PO TBCR: 20.0000 meq | EXTENDED_RELEASE_TABLET | Freq: Every day | ORAL | 0 refills | Status: DC

## 2018-10-05 MED ORDER — SODIUM CHLORIDE 0.9 % IV SOLN
Freq: Once | INTRAVENOUS | Status: AC
  Administered 2018-10-05: 10:00:00 via INTRAVENOUS
  Filled 2018-10-05: qty 200

## 2018-10-05 MED ORDER — SODIUM CHLORIDE 0.9 % IV SOLN
Freq: Once | INTRAVENOUS | Status: AC
  Administered 2018-10-05: 10:00:00 via INTRAVENOUS
  Filled 2018-10-05: qty 250

## 2018-10-05 MED ORDER — SODIUM CHLORIDE 0.9 % IV SOLN
20.0000 mg | Freq: Once | INTRAVENOUS | Status: AC
  Administered 2018-10-05: 20 mg via INTRAVENOUS
  Filled 2018-10-05: qty 2

## 2018-10-05 MED ORDER — DIPHENHYDRAMINE HCL 50 MG/ML IJ SOLN
25.0000 mg | Freq: Once | INTRAMUSCULAR | Status: AC
  Administered 2018-10-05: 25 mg via INTRAVENOUS
  Filled 2018-10-05: qty 1

## 2018-10-05 MED ORDER — HEPARIN SOD (PORK) LOCK FLUSH 100 UNIT/ML IV SOLN
500.0000 [IU] | Freq: Once | INTRAVENOUS | Status: AC
  Administered 2018-10-05: 500 [IU] via INTRAVENOUS
  Filled 2018-10-05: qty 5

## 2018-10-05 MED ORDER — PALONOSETRON HCL INJECTION 0.25 MG/5ML
0.2500 mg | Freq: Once | INTRAVENOUS | Status: AC
  Administered 2018-10-05: 0.25 mg via INTRAVENOUS
  Filled 2018-10-05: qty 5

## 2018-10-05 MED ORDER — SODIUM CHLORIDE 0.9 % IV SOLN
162.4000 mg | Freq: Once | INTRAVENOUS | Status: AC
  Administered 2018-10-05: 160 mg via INTRAVENOUS
  Filled 2018-10-05: qty 16

## 2018-10-05 MED ORDER — SODIUM CHLORIDE 0.9 % IV SOLN
45.0000 mg/m2 | Freq: Once | INTRAVENOUS | Status: AC
  Administered 2018-10-05: 84 mg via INTRAVENOUS
  Filled 2018-10-05: qty 14

## 2018-10-05 MED ORDER — SODIUM CHLORIDE 0.9% FLUSH
10.0000 mL | Freq: Once | INTRAVENOUS | Status: AC
  Administered 2018-10-05: 10 mL via INTRAVENOUS
  Filled 2018-10-05: qty 10

## 2018-10-05 MED ORDER — FAMOTIDINE IN NACL 20-0.9 MG/50ML-% IV SOLN
20.0000 mg | Freq: Once | INTRAVENOUS | Status: AC
  Administered 2018-10-05: 20 mg via INTRAVENOUS
  Filled 2018-10-05: qty 50

## 2018-10-05 NOTE — Progress Notes (Signed)
No new changes noted today 

## 2018-10-06 ENCOUNTER — Ambulatory Visit
Admission: RE | Admit: 2018-10-06 | Discharge: 2018-10-06 | Disposition: A | Discharge status: Home / Self Care | Payer: Medicare Other | Source: Ambulatory Visit | Attending: Radiation Oncology | Admitting: Radiation Oncology

## 2018-10-06 DIAGNOSIS — M6281 Muscle weakness (generalized): Secondary | ICD-10-CM | POA: Diagnosis not present

## 2018-10-06 DIAGNOSIS — Z510 Encounter for antineoplastic radiation therapy: Secondary | ICD-10-CM | POA: Diagnosis not present

## 2018-10-06 DIAGNOSIS — J449 Chronic obstructive pulmonary disease, unspecified: Secondary | ICD-10-CM | POA: Diagnosis not present

## 2018-10-06 DIAGNOSIS — C3431 Malignant neoplasm of lower lobe, right bronchus or lung: Secondary | ICD-10-CM | POA: Diagnosis not present

## 2018-10-06 DIAGNOSIS — I130 Hypertensive heart and chronic kidney disease with heart failure and stage 1 through stage 4 chronic kidney disease, or unspecified chronic kidney disease: Secondary | ICD-10-CM | POA: Diagnosis not present

## 2018-10-06 DIAGNOSIS — I5022 Chronic systolic (congestive) heart failure: Secondary | ICD-10-CM | POA: Diagnosis not present

## 2018-10-06 DIAGNOSIS — R2689 Other abnormalities of gait and mobility: Secondary | ICD-10-CM | POA: Diagnosis not present

## 2018-10-06 NOTE — Progress Notes (Signed)
Hematology/Oncology Consult note [name]  [phone] [phone]  Patient Care Team: Kirk Maple, MD as PCP - General (Family Medicine) Kirk Iba, MD (Internal Medicine) Kirk Cowman, MD as Consulting Physician (Cardiology) Kirk Lye, MD (General Surgery) Kirk Nab, RN as Registered Nurse   Name of the patient: Kirk Jones  [id number]  02/11/1941   Date of visit: 10/06/18  Diagnosis- Squamous cell carcinoma of the lung Stage IIIA T3N1M0  Chief complaint/ Reason for visit- on treatment assessment prior to cycle 2 of weekly carbo/taxol  Heme/Onc history: patient is a 77 year old Caucasian male with a past medical history significant for mechanical aortic valve replacement in 2004. He also has a history of chronic systolic congestive heart failure, ischemic cardiomyopathy, ventricular tachycardia status post ICD placement. He has a history of CVA in 2004 as well as hyperlipidemia and chronic kidney disease. He has had stent placements in the past and follows up with cardiology as well. He had an episode of ventricular tachycardia with ICD shock in August 2018. He has been a chronic smoker and states that he started smoking at the age of 63 quit smoking when he was 18. Patient presented to the ER with symptoms of worsening shortness of breath which led to a chest x-ray. Chest x-ray showed masslike density at the level of spine. This was followed by a CT chest without contrast. CT showed solid-appearing right lower lobe mass 6.6 x 5.6 cm in size extending towards the right hilum. Minimal mediastinal adenopathy with right infrahilar adenopathy was noted. Small nodular opacities throughout the right lung suspicious for metastases. Questionable single lesion noted on the left.  PET CT showed hypermetabolism in RLL abd possible subcarinal adenopathy. Ascending thoracic aneurysm 4.8 cm in diameter. Subtle  metabolic activity and calcifications in pancreatic head   Interval history- tolerated cycle 1 of carbo/taxol relatively well. He felt the 50 mg benadryl pre medication was too much for him. Denies any nausea /vomting. He was having increasing leg edema over last 2 weeks and was prescribed more diuretics by cardiology. Leg swelling has improved since then. However he reports some dizziness when he moves around. No falls  ECOG PS- 2 Pain scale- 0 Opioid associated constipation- no  Review of systems- Review of Systems  Constitutional: Positive for malaise/fatigue. Negative for chills, fever and weight loss.  HENT: Negative for congestion, ear discharge and nosebleeds.   Eyes: Negative for blurred vision.  Respiratory: Positive for shortness of breath. Negative for cough, hemoptysis, sputum production and wheezing.   Cardiovascular: Positive for leg swelling. Negative for chest pain, palpitations, orthopnea and claudication.  Gastrointestinal: Negative for abdominal pain, blood in stool, constipation, diarrhea, heartburn, melena, nausea and vomiting.  Genitourinary: Negative for dysuria, flank pain, frequency, hematuria and urgency.  Musculoskeletal: Negative for back pain, joint pain and myalgias.  Skin: Negative for rash.  Neurological: Negative for dizziness, tingling, focal weakness, seizures, weakness and headaches.  Endo/Heme/Allergies: Does not bruise/bleed easily.  Psychiatric/Behavioral: Negative for depression and suicidal ideas. The patient does not have insomnia.       Allergies  Allergen Reactions  . Prednisone Other (See Comments)    Reaction: "organs shutting down"     Past Medical History:  Diagnosis Date  . AICD (automatic cardioverter/defibrillator) present   . Aortic valve regurgitation   . Atrial fibrillation ([place])   . Barrett's esophagus   . Cancer ([place])    sate III lung  . CHF (congestive heart failure) ([place])   .  COPD (chronic obstructive pulmonary  disease) ([place])   . Diverticulosis   . GERD (gastroesophageal reflux disease)   . Gout   . Headache    aura only  . Heart murmur   . Hematuria   . Hernia of abdominal cavity   . History of prosthetic heart valve   . Hyperlipidemia   . Hypertension   . Nodular prostate with urinary obstruction   . Presence of permanent cardiac pacemaker   . Renal insufficiency   . Substance abuse ([place])    alcohol     Past Surgical History:  Procedure Laterality Date  . CARDIAC VALVE REPLACEMENT N/A 2004  . CARPAL TUNNEL RELEASE Right 2015  . CATARACT EXTRACTION Right 2017  . COLONOSCOPY WITH PROPOFOL N/A 2014  . CORONARY ANGIOPLASTY     stints x2  . EYE SURGERY     cross eyed  . FOOT SURGERY Bilateral    [place]  . FRACTURE SURGERY Right    [place]  . INGUINAL HERNIA REPAIR Right 09/16/2017   Procedure: HERNIA REPAIR INGUINAL ADULT;  Surgeon: Kirk Lye, MD;  Location: [facility] ORS;  Service: General;  Laterality: Right;  . INSERT / REPLACE / [place]     ICD  . PACEMAKER IMPLANT Left 2013   and ICD  . PORTA CATH INSERTION N/A 09/21/2018   Procedure: PORTA CATH INSERTION;  Surgeon: Kirk Huxley, MD;  Location: [place] [facility];  Service: Cardiovascular;  Laterality: N/A;  . VASECTOMY      Social History   Socioeconomic History  . Marital status: Married    Spouse name: Kirk Jones   . Number of children: 5  . Years of education: Not on file  . Highest education level: Not on file  Occupational History  . Not on file  Social Needs  . Financial resource strain: Not hard at all  . Food insecurity:    Worry: Never true    Inability: Never true  . Transportation needs:    Medical: No    Non-medical: No  Tobacco Use  . Smoking status: Former Smoker    Types: Cigarettes    Last attempt to quit: 04/24/1991    Years since quitting: 27.4  . Smokeless tobacco: Never Used  Substance and Sexual Activity  . Alcohol use: Yes    Alcohol/week: 2.0 standard drinks     Types: 2 Shots of liquor per week    Comment: quit December 2015/restarted-2 drinks per night  . Drug use: No  . Sexual activity: Never  Lifestyle  . Physical activity:    Days per week: 0 days    Minutes per session: 0 min  . Stress: Not at all  Relationships  . Social connections:    Talks on phone: Once a week    Gets together: Once a week    Attends religious service: More than 4 times per year    Active member of club or organization: No    Attends meetings of clubs or organizations: Never    Relationship status: Married  . Intimate partner violence:    Fear of current or ex partner: No    Emotionally abused: No    Physically abused: No    Forced sexual activity: No  Other Topics Concern  . Not on file  Social History Narrative  . Not on file    Family History  Problem Relation Age of Onset  . Heart attack Mother   . Diabetes Maternal Grandmother   .  Lung cancer Sister      Current Outpatient Medications:  .  acetaminophen (TYLENOL) 650 MG CR tablet, Take 650 mg by mouth 2 (two) times daily., Disp: , Rfl:  .  aspirin EC 81 MG tablet, Take 81 mg by mouth daily., Disp: , Rfl:  .  COLCRYS 0.6 MG tablet, TAKE 1 TABLET BY MOUTH ONCE DAILY, Disp: 30 tablet, Rfl: 1 .  desonide (DESOWEN) 0.05 % cream, Apply topically 2 (two) times daily., Disp: 60 g, Rfl: 0 .  diltiazem (CARDIZEM CD) 180 MG 24 hr capsule, , Disp: , Rfl: 3 .  diltiazem (DILACOR XR) 180 MG 24 hr capsule, Take 180 mg by mouth daily, Disp: 90 capsule, Rfl: 4 .  empagliflozin (JARDIANCE) 10 MG TABS tablet, Take 10 mg by mouth daily., Disp: , Rfl:  .  fexofenadine (ALLEGRA) 180 MG tablet, Take 180 mg by mouth daily., Disp: , Rfl:  .  finasteride (PROSCAR) 5 MG tablet, TAKE 1 TABLET BY MOUTH ONCE DAILY, Disp: 90 tablet, Rfl: 2 .  furosemide (LASIX) 40 MG tablet, Take 40 mg by mouth daily. , Disp: , Rfl:  .  loratadine (CLARITIN) 10 MG tablet, Take 1 tablet (10 mg total) by mouth daily., Disp: 90 tablet, Rfl: 3 .   losartan (COZAAR) 25 MG tablet, Take 1 tablet (25 mg total) by mouth daily., Disp: 90 tablet, Rfl: 4 .  Magnesium 400 MG TABS, Take 400 mg by mouth daily. , Disp: , Rfl:  .  metoprolol succinate (TOPROL-XL) 50 MG 24 hr tablet, TAKE 1 TABLET BY MOUTH TWICE DAILY WITH MEALS OR IMMEDIATELY FOLLOWING MEAL, Disp: 180 tablet, Rfl: 4 .  Multiple Vitamins-Minerals (CENTRUM ADULTS PO), Take 1 tablet by mouth daily. , Disp: , Rfl:  .  omeprazole (PRILOSEC) 20 MG capsule, Take 1 capsule (20 mg total) by mouth daily., Disp: 90 capsule, Rfl: 4 .  Polyethyl Glycol-Propyl Glycol (SYSTANE OP), Place 1 drop into both eyes as needed (for dry eyes)., Disp: , Rfl:  .  tamsulosin (FLOMAX) 0.4 MG CAPS capsule, Take 2 capsules (0.8 mg total) by mouth daily., Disp: 90 capsule, Rfl: 4 .  warfarin (COUMADIN) 3 MG tablet, TAKE 1 TABLET BY MOUTH ONCE DAILY., Disp: 30 tablet, Rfl: 2 .  dexamethasone (DECADRON) 4 MG tablet, Take 2 tablets (8 mg total) by mouth daily. Start the day after chemotherapy for 2 days. (Patient not taking: Reported on 10/05/2018), Disp: 30 tablet, Rfl: 1 .  fluticasone (FLONASE) 50 MCG/ACT nasal spray, Place 2 sprays daily into both nostrils. (Patient not taking: Reported on 10/05/2018), Disp: 16 g, Rfl: 12 .  ketoconazole (NIZORAL) 2 % shampoo, Apply 1 application topically 2 (two) times a week. (Patient not taking: Reported on 10/05/2018), Disp: 120 mL, Rfl: 2 .  lidocaine-prilocaine (EMLA) cream, Apply to affected area once (Patient not taking: Reported on 10/05/2018), Disp: 30 g, Rfl: 3 .  LORazepam (ATIVAN) 0.5 MG tablet, Take 1 tablet (0.5 mg total) by mouth every 6 (six) hours as needed (Nausea or vomiting). (Patient not taking: Reported on 10/05/2018), Disp: 30 tablet, Rfl: 0 .  ondansetron (ZOFRAN) 8 MG tablet, Take 1 tablet (8 mg total) by mouth 2 (two) times daily as needed for refractory nausea / vomiting. Start on day 3 after chemo. (Patient not taking: Reported on 10/05/2018), Disp: 30 tablet, Rfl:  1 .  potassium chloride SA (K-DUR,KLOR-CON) 20 MEQ tablet, Take 1 tablet (20 mEq total) by mouth daily., Disp: 4 tablet, Rfl: 0 .  prochlorperazine (COMPAZINE) 10  MG tablet, Take 1 tablet (10 mg total) by mouth every 6 (six) hours as needed (Nausea or vomiting). (Patient not taking: Reported on 10/05/2018), Disp: 30 tablet, Rfl: 1 .  tiotropium (SPIRIVA) 18 MCG inhalation capsule, Place 1 capsule (18 mcg total) into inhaler and inhale daily. (Patient not taking: Reported on 10/05/2018), Disp: 90 capsule, Rfl: 4 .  UNABLE TO FIND, CBD Hemp topical cream, Disp: , Rfl:  .  warfarin (COUMADIN) 1 MG tablet, TAKE 1 TABLET BY MOUTH ONCE DAILY (Patient not taking: Reported on 08/25/2018), Disp: 30 tablet, Rfl: 12 .  warfarin (COUMADIN) 4 MG tablet, TAKE 1 TABLET BY MOUTH ONCE DAILY AT 6 P.M. (Patient not taking: Reported on 08/25/2018), Disp: 30 tablet, Rfl: 3 No current facility-administered medications for this visit.   Facility-Administered Medications Ordered in Other Visits:  .  0.9 %  sodium chloride infusion, , Intravenous, Continuous, Antionette Char  Physical exam:  Vitals:   10/05/18 0904  BP: (!) 93/56  Pulse: (!) 109  Resp: 18  Temp: 98.2 F (36.8 C)  TempSrc: Tympanic  Weight: 164 lb 8 oz (74.6 kg)  Height: 5\' 3"  (1.6 m)   Physical Exam  Constitutional: He is oriented to person, place, and time.  Elderly gentleman sitting ina  Wheelchair. Appears in no acute distress  HENT:  Head: Normocephalic and atraumatic.  Eyes: Pupils are equal, round, and reactive to light. EOM are normal.  Neck: Normal range of motion.  Cardiovascular: Regular rhythm and normal heart sounds.  irregular  Pulmonary/Chest: Effort normal and breath sounds normal.  Abdominal: Soft. Bowel sounds are normal.  Musculoskeletal: He exhibits edema (trace b/l).  Neurological: He is alert and oriented to person, place, and time.  Skin: Skin is warm and dry.     CMP Latest Ref Rng & Units 10/05/2018  Glucose  70 - 99 mg/dL 154(H)  BUN 8 - 23 mg/dL 36(H)  Creatinine 0.61 - 1.24 mg/dL 1.18  Sodium 135 - 145 mmol/L 128(L)  Potassium 3.5 - 5.1 mmol/L 3.0(L)  Chloride 98 - 111 mmol/L 83(L)  CO2 22 - 32 mmol/L 35(H)  Calcium 8.9 - 10.3 mg/dL 8.5(L)  Total Protein 6.5 - 8.1 g/dL 6.2(L)  Total Bilirubin 0.3 - 1.2 mg/dL 1.3(H)  Alkaline Phos 38 - 126 U/L 86  AST 15 - 41 U/L 28  ALT 0 - 44 U/L 16   CBC Latest Ref Rng & Units 10/05/2018  WBC 4.0 - 10.5 K/uL 9.4  Hemoglobin 13.0 - 17.0 g/dL 10.2(L)  Hematocrit 39.0 - 52.0 % 30.0(L)  Platelets 150 - 400 K/uL 280    No images are attached to the encounter.  Ct Guided Needle Placement  Result Date: 09/08/2018 CLINICAL DATA:  Hypermetabolic right lower lobe lung lesion with effusion. Shortness of breath. EXAM: CT GUIDED PARACENTESIS CT-GUIDED CORE BIOPSY OF RIGHT LOWER LOBE LUNG LESION ANESTHESIA/SEDATION: Intravenous Fentanyl and Versed were administered as conscious sedation during continuous monitoring of the patient's level of consciousness and physiological / cardiorespiratory status by the radiology RN, with a total moderate sedation time of 24 minutes. PROCEDURE: The procedure risks, benefits, and alternatives were explained to the patient. Questions regarding the procedure were encouraged and answered. The patient understands and consents to the procedure. patient placed left lateral decubitus. select axial scans through the thorax obtained. Lesion and moderate effusion or localized. The skin entry site for diagnostic and therapeutic for thoracentesis was selected. The operative field was prepped with chlorhexidinein a sterile fashion, and a sterile drape  was applied covering the operative field. A sterile gown and sterile gloves were used for the procedure. Local anesthesia was provided with 1% Lidocaine. A Safe-T-Centesis needle was advanced into the right pleural space until fluid could be aspirated. 1.2 L of clear yellow fluid were removed. The  patient tolerated the procedure well. Fluid sent for cytology. In similar fashion, skin entry site for biopsy of the right lower lobe lesion was selected and marked. The operative field was prepped with chlorhexidinein a sterile fashion, and a sterile drape was applied covering the operative field. A sterile gown and sterile gloves were used for the procedure. Local anesthesia was provided with 1% Lidocaine. Under CT fluoroscopic guidance, a 17 gauge trocar needle was advanced to the margin of the lesion. Once needle tip position was confirmed, coaxial 18-gauge core biopsy samples were obtained, submitted in formalin to surgical pathology. The guide needle was removed. Postprocedure scans show no pneumothorax or hemorrhage. Significant decrease in right pleural effusion. COMPLICATIONS: None immediate FINDINGS: Limited CT demonstrates moderate right pleural effusion and again we demonstrates the right lower lobe mass. 1.2 L right pleural fluid were aspirated and sent for cytology analysis. Core biopsy samples of the right lower lobe lesion obtained as above. The patient tolerated the procedure well. IMPRESSION: 1. Technically successful CT-guided right thoracentesis removing 1.2 L. 2. Technically successful CT-guided right lower lobe lung lesion core biopsy. Electronically Signed   By: Lucrezia Europe M.D.   On: 09/08/2018 15:17   Ct Abdomen W Wo Contrast  Result Date: 09/25/2018 CLINICAL DATA:  Evaluate pancreatic calcifications EXAM: CT ABDOMEN WITHOUT AND WITH CONTRAST TECHNIQUE: Multidetector CT imaging of the abdomen was performed following the standard protocol before and following the bolus administration of intravenous contrast. CONTRAST:  133mL ISOVUE-300 IOPAMIDOL (ISOVUE-300) INJECTION 61% COMPARISON:  PET-CT 08/30/2018. FINDINGS: Lower chest: Large right lower lobe lung mass is again identified measuring 7.9 cm. Loculated pleural fluid is identified overlying the right midlung and right lower lobe. Small  left pleural effusion noted. Aortic atherosclerosis is identified. Hepatobiliary: No focal liver abnormality. The gallbladder is not confidently identified and may be surgically absent. Pancreas: No pancreatic inflammation identified faint punctate areas of mild hyperattenuation within the head of pancreas are again identified within head of pancreas, image 39/2 and may represent small calcifications. 1.4 cm low-density structure within the head of pancreas is identified measuring 32 HU, image 96/14. This is favored to represent a mildly dilated common bile duct. This roughly corresponds to the areas of calcification within the head of pancreas which may reflect choledocholithiasis. Body and tail of pancreas appear normal. Spleen: Spleen normal. Adrenals/Urinary Tract: Unremarkable appearance of the adrenal glands. There is a cyst arising from inferior pole of right kidney measuring 3 cm, image 46/11. Smaller cysts noted within the upper pole of the right kidney. Multiple areas of cortical scarring involve the left kidney. Stomach/Bowel: Stomach and small bowel loops have a normal course and caliber. Extensive colonic diverticulosis noted. Vascular/Lymphatic: Aortic atherosclerosis. No aneurysm. No adenopathy identified within the abdomen. Other: Trace free fluid identified along the pericolic gutters. Musculoskeletal: Mild thoraco lumbar scoliosis and multilevel degenerative disc disease. IMPRESSION: 1. Faint calcifications within head of pancreas are again noted. There is cystic area of low attenuation within the head of pancreas which may represent a mildly dilated common bile duct. Calcifications may represent choledocholithiasis. In a patient who cannot have an MRI due to pacer device ERCP may be helpful to confirm common bile duct stones. 2. Large right  lower lobe lung mass with surrounding loculated pleural fluid 3.  Aortic Atherosclerosis (ICD10-I70.0). Electronically Signed   By: Kerby Moors M.D.   On:  09/25/2018 16:27   Ct Biopsy  Result Date: 09/08/2018 CLINICAL DATA:  Hypermetabolic right lower lobe lung lesion with effusion. Shortness of breath. EXAM: CT GUIDED PARACENTESIS CT-GUIDED CORE BIOPSY OF RIGHT LOWER LOBE LUNG LESION ANESTHESIA/SEDATION: Intravenous Fentanyl and Versed were administered as conscious sedation during continuous monitoring of the patient's level of consciousness and physiological / cardiorespiratory status by the radiology RN, with a total moderate sedation time of 24 minutes. PROCEDURE: The procedure risks, benefits, and alternatives were explained to the patient. Questions regarding the procedure were encouraged and answered. The patient understands and consents to the procedure. patient placed left lateral decubitus. select axial scans through the thorax obtained. Lesion and moderate effusion or localized. The skin entry site for diagnostic and therapeutic for thoracentesis was selected. The operative field was prepped with chlorhexidinein a sterile fashion, and a sterile drape was applied covering the operative field. A sterile gown and sterile gloves were used for the procedure. Local anesthesia was provided with 1% Lidocaine. A Safe-T-Centesis needle was advanced into the right pleural space until fluid could be aspirated. 1.2 L of clear yellow fluid were removed. The patient tolerated the procedure well. Fluid sent for cytology. In similar fashion, skin entry site for biopsy of the right lower lobe lesion was selected and marked. The operative field was prepped with chlorhexidinein a sterile fashion, and a sterile drape was applied covering the operative field. A sterile gown and sterile gloves were used for the procedure. Local anesthesia was provided with 1% Lidocaine. Under CT fluoroscopic guidance, a 17 gauge trocar needle was advanced to the margin of the lesion. Once needle tip position was confirmed, coaxial 18-gauge core biopsy samples were obtained, submitted in  formalin to surgical pathology. The guide needle was removed. Postprocedure scans show no pneumothorax or hemorrhage. Significant decrease in right pleural effusion. COMPLICATIONS: None immediate FINDINGS: Limited CT demonstrates moderate right pleural effusion and again we demonstrates the right lower lobe mass. 1.2 L right pleural fluid were aspirated and sent for cytology analysis. Core biopsy samples of the right lower lobe lesion obtained as above. The patient tolerated the procedure well. IMPRESSION: 1. Technically successful CT-guided right thoracentesis removing 1.2 L. 2. Technically successful CT-guided right lower lobe lung lesion core biopsy. Electronically Signed   By: Lucrezia Europe M.D.   On: 09/08/2018 15:17     Assessment and plan- Patient is a 77 y.o. male with Stage IIIA squamous cell carcinoma of the lung T3N1M0.   He is here for on treatment assessment prior to cycle 2 of weekly carbotaxol chemotherapy  1.  Counts okay to proceed with cycle 2 of weekly carbotaxol chemotherapy today.  He will proceed for cycle 3 next week and I will see him back in 2 weeks for cycle 4 of carbotaxol chemotherapy.  2.  Leg swelling: Improved after diuresis.  Follow-up with cardiology  3.  He does have hyponatremia and hypokalemia today which may be due to overdiuresis.  He also some dizziness on ambulation.  However given his complex cardiac issues and the fact that he was just diuresed I would like to hold off on IV fluids today until I hear back from cardiology.  I will proceed with 40 IV potassium today.  4.  With regards to his thoracic aneurysm: Patient reports that Dr. Tomasita Crumble was mentioned he could follow-up with  this but given his age and comorbidities as well as the location no surgical intervention can be planned for this.  I will cancel vascular surgery consult   Visit Diagnosis 1. Encounter for antineoplastic chemotherapy   2. Squamous cell carcinoma of right lung ([place])   3. Hyponatremia     4. Hypokalemia      Dr. Randa Evens, MD, MPH [facility] at [facility] [phone] 10/06/2018 9:51 AM

## 2018-10-09 ENCOUNTER — Ambulatory Visit
Admission: RE | Admit: 2018-10-09 | Discharge: 2018-10-09 | Disposition: A | Discharge status: Home / Self Care | Payer: Medicare Other | Source: Ambulatory Visit | Attending: Radiation Oncology | Admitting: Radiation Oncology

## 2018-10-09 DIAGNOSIS — J449 Chronic obstructive pulmonary disease, unspecified: Secondary | ICD-10-CM | POA: Diagnosis not present

## 2018-10-09 DIAGNOSIS — Z510 Encounter for antineoplastic radiation therapy: Secondary | ICD-10-CM | POA: Diagnosis not present

## 2018-10-09 DIAGNOSIS — I130 Hypertensive heart and chronic kidney disease with heart failure and stage 1 through stage 4 chronic kidney disease, or unspecified chronic kidney disease: Secondary | ICD-10-CM | POA: Diagnosis not present

## 2018-10-09 DIAGNOSIS — I5022 Chronic systolic (congestive) heart failure: Secondary | ICD-10-CM | POA: Diagnosis not present

## 2018-10-09 DIAGNOSIS — M6281 Muscle weakness (generalized): Secondary | ICD-10-CM | POA: Diagnosis not present

## 2018-10-09 DIAGNOSIS — R2689 Other abnormalities of gait and mobility: Secondary | ICD-10-CM | POA: Diagnosis not present

## 2018-10-09 DIAGNOSIS — C3431 Malignant neoplasm of lower lobe, right bronchus or lung: Secondary | ICD-10-CM | POA: Diagnosis not present

## 2018-10-10 ENCOUNTER — Ambulatory Visit
Admission: RE | Admit: 2018-10-10 | Discharge: 2018-10-10 | Disposition: A | Discharge status: Home / Self Care | Payer: Medicare Other | Source: Ambulatory Visit | Attending: Radiation Oncology | Admitting: Radiation Oncology

## 2018-10-10 ENCOUNTER — Ambulatory Visit: Payer: Medicare Other | Admitting: Family Medicine

## 2018-10-10 DIAGNOSIS — M6281 Muscle weakness (generalized): Secondary | ICD-10-CM | POA: Diagnosis not present

## 2018-10-10 DIAGNOSIS — J449 Chronic obstructive pulmonary disease, unspecified: Secondary | ICD-10-CM | POA: Diagnosis not present

## 2018-10-10 DIAGNOSIS — I5022 Chronic systolic (congestive) heart failure: Secondary | ICD-10-CM | POA: Diagnosis not present

## 2018-10-10 DIAGNOSIS — I472 Ventricular tachycardia: Secondary | ICD-10-CM | POA: Diagnosis not present

## 2018-10-10 DIAGNOSIS — I130 Hypertensive heart and chronic kidney disease with heart failure and stage 1 through stage 4 chronic kidney disease, or unspecified chronic kidney disease: Secondary | ICD-10-CM | POA: Diagnosis not present

## 2018-10-10 DIAGNOSIS — C3431 Malignant neoplasm of lower lobe, right bronchus or lung: Secondary | ICD-10-CM | POA: Diagnosis not present

## 2018-10-10 DIAGNOSIS — R2689 Other abnormalities of gait and mobility: Secondary | ICD-10-CM | POA: Diagnosis not present

## 2018-10-11 ENCOUNTER — Other Ambulatory Visit: Payer: Self-pay

## 2018-10-11 ENCOUNTER — Ambulatory Visit
Admission: RE | Admit: 2018-10-11 | Discharge: 2018-10-11 | Disposition: A | Discharge status: Home / Self Care | Payer: Medicare Other | Source: Ambulatory Visit | Attending: Radiation Oncology | Admitting: Radiation Oncology

## 2018-10-11 ENCOUNTER — Emergency Department: Payer: Medicare Other

## 2018-10-11 ENCOUNTER — Encounter: Payer: Self-pay | Admitting: Family Medicine

## 2018-10-11 ENCOUNTER — Ambulatory Visit (INDEPENDENT_AMBULATORY_CARE_PROVIDER_SITE_OTHER): Payer: Medicare Other | Admitting: Family Medicine

## 2018-10-11 ENCOUNTER — Inpatient Hospital Stay
Admission: EM | Admit: 2018-10-11 | Discharge: 2018-10-15 | DRG: 308 | Disposition: A | Discharge status: Home / Self Care | Payer: Medicare Other | Attending: Internal Medicine | Admitting: Internal Medicine

## 2018-10-11 VITALS — BP 116/70 | HR 80 | Ht 63.0 in | Wt 155.0 lb

## 2018-10-11 DIAGNOSIS — N183 Chronic kidney disease, stage 3 (moderate): Secondary | ICD-10-CM | POA: Diagnosis present

## 2018-10-11 DIAGNOSIS — Z8679 Personal history of other diseases of the circulatory system: Secondary | ICD-10-CM

## 2018-10-11 DIAGNOSIS — Z515 Encounter for palliative care: Secondary | ICD-10-CM

## 2018-10-11 DIAGNOSIS — Z801 Family history of malignant neoplasm of trachea, bronchus and lung: Secondary | ICD-10-CM

## 2018-10-11 DIAGNOSIS — C3490 Malignant neoplasm of unspecified part of unspecified bronchus or lung: Secondary | ICD-10-CM | POA: Diagnosis present

## 2018-10-11 DIAGNOSIS — E871 Hypo-osmolality and hyponatremia: Secondary | ICD-10-CM | POA: Diagnosis not present

## 2018-10-11 DIAGNOSIS — I130 Hypertensive heart and chronic kidney disease with heart failure and stage 1 through stage 4 chronic kidney disease, or unspecified chronic kidney disease: Secondary | ICD-10-CM | POA: Diagnosis present

## 2018-10-11 DIAGNOSIS — S0181XA Laceration without foreign body of other part of head, initial encounter: Secondary | ICD-10-CM | POA: Diagnosis present

## 2018-10-11 DIAGNOSIS — R55 Syncope and collapse: Secondary | ICD-10-CM | POA: Diagnosis present

## 2018-10-11 DIAGNOSIS — C3431 Malignant neoplasm of lower lobe, right bronchus or lung: Secondary | ICD-10-CM | POA: Diagnosis not present

## 2018-10-11 DIAGNOSIS — Z952 Presence of prosthetic heart valve: Secondary | ICD-10-CM

## 2018-10-11 DIAGNOSIS — W19XXXA Unspecified fall, initial encounter: Secondary | ICD-10-CM | POA: Diagnosis not present

## 2018-10-11 DIAGNOSIS — I5043 Acute on chronic combined systolic (congestive) and diastolic (congestive) heart failure: Secondary | ICD-10-CM | POA: Diagnosis not present

## 2018-10-11 DIAGNOSIS — Z9861 Coronary angioplasty status: Secondary | ICD-10-CM

## 2018-10-11 DIAGNOSIS — K2270 Barrett's esophagus without dysplasia: Secondary | ICD-10-CM | POA: Diagnosis present

## 2018-10-11 DIAGNOSIS — I471 Supraventricular tachycardia: Secondary | ICD-10-CM | POA: Diagnosis not present

## 2018-10-11 DIAGNOSIS — I482 Chronic atrial fibrillation, unspecified: Secondary | ICD-10-CM | POA: Diagnosis not present

## 2018-10-11 DIAGNOSIS — I719 Aortic aneurysm of unspecified site, without rupture: Secondary | ICD-10-CM | POA: Diagnosis present

## 2018-10-11 DIAGNOSIS — Z95810 Presence of automatic (implantable) cardiac defibrillator: Secondary | ICD-10-CM

## 2018-10-11 DIAGNOSIS — I472 Ventricular tachycardia, unspecified: Secondary | ICD-10-CM

## 2018-10-11 DIAGNOSIS — M7989 Other specified soft tissue disorders: Secondary | ICD-10-CM

## 2018-10-11 DIAGNOSIS — Z85118 Personal history of other malignant neoplasm of bronchus and lung: Secondary | ICD-10-CM

## 2018-10-11 DIAGNOSIS — E869 Volume depletion, unspecified: Secondary | ICD-10-CM | POA: Diagnosis present

## 2018-10-11 DIAGNOSIS — S299XXA Unspecified injury of thorax, initial encounter: Secondary | ICD-10-CM | POA: Diagnosis not present

## 2018-10-11 DIAGNOSIS — J449 Chronic obstructive pulmonary disease, unspecified: Secondary | ICD-10-CM | POA: Diagnosis present

## 2018-10-11 DIAGNOSIS — I5022 Chronic systolic (congestive) heart failure: Secondary | ICD-10-CM | POA: Diagnosis not present

## 2018-10-11 DIAGNOSIS — Z8249 Family history of ischemic heart disease and other diseases of the circulatory system: Secondary | ICD-10-CM

## 2018-10-11 DIAGNOSIS — R42 Dizziness and giddiness: Secondary | ICD-10-CM | POA: Diagnosis not present

## 2018-10-11 DIAGNOSIS — Z7951 Long term (current) use of inhaled steroids: Secondary | ICD-10-CM

## 2018-10-11 DIAGNOSIS — M109 Gout, unspecified: Secondary | ICD-10-CM | POA: Diagnosis present

## 2018-10-11 DIAGNOSIS — I480 Paroxysmal atrial fibrillation: Secondary | ICD-10-CM | POA: Diagnosis present

## 2018-10-11 DIAGNOSIS — I493 Ventricular premature depolarization: Secondary | ICD-10-CM | POA: Diagnosis present

## 2018-10-11 DIAGNOSIS — Z66 Do not resuscitate: Secondary | ICD-10-CM | POA: Diagnosis not present

## 2018-10-11 DIAGNOSIS — C3491 Malignant neoplasm of unspecified part of right bronchus or lung: Secondary | ICD-10-CM | POA: Diagnosis present

## 2018-10-11 DIAGNOSIS — Z833 Family history of diabetes mellitus: Secondary | ICD-10-CM

## 2018-10-11 DIAGNOSIS — I255 Ischemic cardiomyopathy: Secondary | ICD-10-CM | POA: Diagnosis present

## 2018-10-11 DIAGNOSIS — X58XXXA Exposure to other specified factors, initial encounter: Secondary | ICD-10-CM | POA: Diagnosis present

## 2018-10-11 DIAGNOSIS — E785 Hyperlipidemia, unspecified: Secondary | ICD-10-CM | POA: Diagnosis present

## 2018-10-11 DIAGNOSIS — I2510 Atherosclerotic heart disease of native coronary artery without angina pectoris: Secondary | ICD-10-CM | POA: Diagnosis present

## 2018-10-11 DIAGNOSIS — L8990 Pressure ulcer of unspecified site, unspecified stage: Secondary | ICD-10-CM

## 2018-10-11 DIAGNOSIS — I083 Combined rheumatic disorders of mitral, aortic and tricuspid valves: Secondary | ICD-10-CM | POA: Diagnosis present

## 2018-10-11 DIAGNOSIS — E1122 Type 2 diabetes mellitus with diabetic chronic kidney disease: Secondary | ICD-10-CM | POA: Diagnosis present

## 2018-10-11 DIAGNOSIS — Z7901 Long term (current) use of anticoagulants: Secondary | ICD-10-CM

## 2018-10-11 DIAGNOSIS — Z888 Allergy status to other drugs, medicaments and biological substances status: Secondary | ICD-10-CM

## 2018-10-11 DIAGNOSIS — Z8673 Personal history of transient ischemic attack (TIA), and cerebral infarction without residual deficits: Secondary | ICD-10-CM

## 2018-10-11 DIAGNOSIS — Z87891 Personal history of nicotine dependence: Secondary | ICD-10-CM

## 2018-10-11 DIAGNOSIS — S0990XA Unspecified injury of head, initial encounter: Secondary | ICD-10-CM | POA: Diagnosis not present

## 2018-10-11 DIAGNOSIS — I248 Other forms of acute ischemic heart disease: Secondary | ICD-10-CM | POA: Diagnosis present

## 2018-10-11 HISTORY — DX: Cardiac arrhythmia, unspecified: I49.9

## 2018-10-11 NOTE — ED Triage Notes (Signed)
Pt arrived via [facility] from home with c/o fall. EMS states that pt was standing up and felt slightly dizzy and fell and hit his head on the headboard. EMS states that pt has a laceration to right temple that bled approximately 100 cc of blood. EMS states pt never had LOC but pt felt like he might have passed out when he stood up. Pt on coumadin.

## 2018-10-11 NOTE — Progress Notes (Signed)
BP 116/70   Pulse 80   Ht 5' 3"  (1.6 m)   Wt 155 lb (70.3 kg)   SpO2 98%   BMI 27.46 kg/m    Subjective:    [name], male    DOB: August 28, 1941, 77 y.o.   MRN: [id number]  HPI: Kirk Jones is a 77 y.o. male  Chief Complaint  Patient presents with  . Edema    Leg are better   Here today for f/u b/l leg swelling. On 40 mg lasix daily and wearing compression stockings. Swelling doing better, no longer draining or significantly painful. Feeling tired from starting chemo treatments but overall doing fairly well all things considered. Denies CP, SOB, palpitations.   States Oncology is going to start drawing his INR monthly from his port and asking for [place] to check levels and interpret. Has appt for this tomorrow. No bleeding or bruising issues. Taking coumadin at 3 mg daily currently. Last INR was subtherapeutic at 1.9 but had only been back on his coumadin for a couple of days at that time.   Past Medical History:  Diagnosis Date  . AICD (automatic cardioverter/defibrillator) present   . Aortic valve regurgitation   . Atrial fibrillation ([place])   . Barrett's esophagus   . Cancer ([place])    sate III lung  . CHF (congestive heart failure) ([place])   . COPD (chronic obstructive pulmonary disease) ([place])   . Diverticulosis   . Dysrhythmia   . GERD (gastroesophageal reflux disease)   . Gout   . Headache    aura only  . Heart murmur   . Hematuria   . Hernia of abdominal cavity   . History of prosthetic heart valve   . Hyperlipidemia   . Hypertension   . Nodular prostate with urinary obstruction   . Presence of permanent cardiac pacemaker   . Renal insufficiency   . Substance abuse ([place])    alcohol   Social History   Socioeconomic History  . Marital status: Married    Spouse name: Belenda Cruise   . Number of children: 5  . Years of education: Not on file  . Highest education level: Not on file  Occupational History  . Not on file  Social Needs  .  Financial resource strain: Not hard at all  . Food insecurity:    Worry: Never true    Inability: Never true  . Transportation needs:    Medical: No    Non-medical: No  Tobacco Use  . Smoking status: Former Smoker    Types: Cigarettes    Last attempt to quit: 04/24/1991    Years since quitting: 27.4  . Smokeless tobacco: Never Used  Substance and Sexual Activity  . Alcohol use: Yes    Alcohol/week: 2.0 standard drinks    Types: 2 Shots of liquor per week    Comment: quit December 2015/restarted-2 drinks per night  . Drug use: No  . Sexual activity: Never  Lifestyle  . Physical activity:    Days per week: 0 days    Minutes per session: 0 min  . Stress: Not at all  Relationships  . Social connections:    Talks on phone: Once a week    Gets together: Once a week    Attends religious service: More than 4 times per year    Active member of club or organization: No    Attends meetings of clubs or organizations: Never    Relationship status: Married  .  Intimate partner violence:    Fear of current or ex partner: No    Emotionally abused: No    Physically abused: No    Forced sexual activity: No  Other Topics Concern  . Not on file  Social History Narrative  . Not on file    Relevant past medical, surgical, family and social history reviewed and updated as indicated. Interim medical history since our last visit reviewed. Allergies and medications reviewed and updated.  Review of Systems  Per HPI unless specifically indicated above     Objective:    BP 116/70   Pulse 80   Ht 5' 3"  (1.6 m)   Wt 155 lb (70.3 kg)   SpO2 98%   BMI 27.46 kg/m   Wt Readings from Last 3 Encounters:  10/15/18 162 lb 3.2 oz (73.6 kg)  10/11/18 155 lb (70.3 kg)  10/05/18 164 lb 8 oz (74.6 kg)    Physical Exam  Constitutional: He is oriented to person, place, and time. He appears well-developed and well-nourished. No distress.  HENT:  Head: Atraumatic.  Eyes: Conjunctivae and EOM are  normal.  Neck: Normal range of motion.  Cardiovascular: Normal rate and intact distal pulses.  Pulmonary/Chest: Effort normal. No respiratory distress.  Musculoskeletal: Normal range of motion. He exhibits edema (1+ edema b/l LEs) and tenderness (minimal ttp b/l LEs).  Neurological: He is alert and oriented to person, place, and time.  Skin: Skin is warm and dry. No erythema.  Psychiatric: He has a normal mood and affect. His behavior is normal. Thought content normal.  Nursing note and vitals reviewed.   Results for orders placed or performed in visit on 10/05/18  CBC with Differential/Platelet  Result Value Ref Range   WBC 9.4 4.0 - 10.5 K/uL   RBC 3.31 (L) 4.22 - 5.81 MIL/uL   Hemoglobin 10.2 (L) 13.0 - 17.0 g/dL   HCT 30.0 (L) 39.0 - 52.0 %   MCV 90.6 80.0 - 100.0 fL   MCH 30.8 26.0 - 34.0 pg   MCHC 34.0 30.0 - 36.0 g/dL   RDW 14.6 11.5 - 15.5 %   Platelets 280 150 - 400 K/uL   nRBC 0.0 0.0 - 0.2 %   Neutrophils Relative % 93 %   Neutro Abs 8.7 (H) 1.7 - 7.7 K/uL   Lymphocytes Relative 2 %   Lymphs Abs 0.2 (L) 0.7 - 4.0 K/uL   Monocytes Relative 4 %   Monocytes Absolute 0.4 0.1 - 1.0 K/uL   Eosinophils Relative 0 %   Eosinophils Absolute 0.0 0.0 - 0.5 K/uL   Basophils Relative 0 %   Basophils Absolute 0.0 0.0 - 0.1 K/uL   Immature Granulocytes 1 %   Abs Immature Granulocytes 0.10 (H) 0.00 - 0.07 K/uL  Comprehensive metabolic panel  Result Value Ref Range   Sodium 128 (L) 135 - 145 mmol/L   Potassium 3.0 (L) 3.5 - 5.1 mmol/L   Chloride 83 (L) 98 - 111 mmol/L   CO2 35 (H) 22 - 32 mmol/L   Glucose, Bld 154 (H) 70 - 99 mg/dL   BUN 36 (H) 8 - 23 mg/dL   Creatinine, Ser 1.18 0.61 - 1.24 mg/dL   Calcium 8.5 (L) 8.9 - 10.3 mg/dL   Total Protein 6.2 (L) 6.5 - 8.1 g/dL   Albumin 2.8 (L) 3.5 - 5.0 g/dL   AST 28 15 - 41 U/L   ALT 16 0 - 44 U/L   Alkaline Phosphatase 86 38 - 126  U/L   Total Bilirubin 1.3 (H) 0.3 - 1.2 mg/dL   GFR calc non Af Amer 58 (L) >60 mL/min   GFR  calc Af Amer >60 >60 mL/min   Anion gap 10 5 - 15      Assessment & Plan:   Problem List Items Addressed This Visit      Cardiovascular and Mediastinum   Atrial fibrillation ([place]) - Primary    Stable, rate well controlled currently. Await INR reading tomorrow with Oncology. Continue current coumadin dose in meantime      Systolic congestive heart failure ([facility])    Swelling improved, no CP or SOB. Continue current regimen        Other   History of prosthetic heart valve (Chronic)    Await INR reading from Oncology tomorrow, will check lab results and adjust as needed. May need to make more sustainable long-term plan for management, possibly need to hand off INRs to specialist during chemotherapy          Follow up plan: Return in about 4 weeks (around 11/08/2018) for CPE with MAC.

## 2018-10-11 NOTE — ED Provider Notes (Signed)
[facility] Emergency Department Provider Note ________________________________   First MD Initiated Contact with Patient 10/11/18 2325     (approximate)  I have reviewed the triage vital signs and the nursing notes.   HISTORY  Chief Complaint No chief complaint on file.    HPI Kirk Jones is a 77 y.o. male with bolus of chronic medical conditions including CHF COPD atrial fibrillation with indwelling AICD and known aortic aneurysm presents to the emergency department following a syncopal episode. Patient states that he was in his chair and subsequently stood up and attempted to take a step when he lost consciousness. Patient states that he believes he may struck his head against a side table. Patient denies any preceding symptoms no chest pain shortness of breath and dizziness nausea or vomiting.   Past Medical History:  Diagnosis Date  . AICD (automatic cardioverter/defibrillator) present   . Aortic valve regurgitation   . Atrial fibrillation ([place])   . Barrett's esophagus   . Cancer ([place])    sate III lung  . CHF (congestive heart failure) ([place])   . COPD (chronic obstructive pulmonary disease) ([place])   . Diverticulosis   . GERD (gastroesophageal reflux disease)   . Gout   . Headache    aura only  . Heart murmur   . Hematuria   . Hernia of abdominal cavity   . History of prosthetic heart valve   . Hyperlipidemia   . Hypertension   . Nodular prostate with urinary obstruction   . Presence of permanent cardiac pacemaker   . Renal insufficiency   . Substance abuse ([place])    alcohol    Patient Active Problem List   Diagnosis Date Noted  . Squamous cell carcinoma of right lung ([place]) 09/16/2018  . Lung mass 08/22/2018  . Exertional shortness of breath 06/07/2018  . Peripheral edema 06/07/2018  . Allergic rhinitis 02/12/2018  . Advanced care planning/counseling discussion 10/19/2017  . Expressive aphasia 06/13/2017  . Hx of adenomatous  colonic polyps 02/18/2017  . Bilateral cataracts 08/18/2016  . CAD (coronary artery disease) 10/06/2015  . Pacemaker 10/06/2015  . Automatic implantable cardioverter-defibrillator in situ 10/06/2015  . Nodular prostate with urinary obstruction 10/06/2015  . Senile purpura ([place]) 10/06/2015  . Barrett's esophagus 05/28/2015  . Hypertension 05/28/2015  . COPD (chronic obstructive pulmonary disease) ([place]) 05/28/2015  . Hyperlipidemia 05/28/2015  . Diverticulosis 05/28/2015  . CKD (chronic kidney disease), stage III ([place]) 05/28/2015  . Gout 05/28/2015  . History of prosthetic heart valve 05/13/2015  . Atrial fibrillation ([place]) 05/13/2015  . CVA (cerebral vascular accident) ([place]) 07/05/2014  . Systolic congestive heart failure ([place]) 07/05/2014  . History of TIA (transient ischemic attack) 02/12/2013  . Warfarin anticoagulation 02/12/2013    Past Surgical History:  Procedure Laterality Date  . CARDIAC VALVE REPLACEMENT N/A 2004  . CARPAL TUNNEL RELEASE Right 2015  . CATARACT EXTRACTION Right 2017  . COLONOSCOPY WITH PROPOFOL N/A 2014  . CORONARY ANGIOPLASTY     stints x2  . EYE SURGERY     cross eyed  . FOOT SURGERY Bilateral    [place]  . FRACTURE SURGERY Right    [place]  . INGUINAL HERNIA REPAIR Right 09/16/2017   Procedure: HERNIA REPAIR INGUINAL ADULT;  Surgeon: Christene Lye, MD;  Location: [facility] ORS;  Service: General;  Laterality: Right;  . INSERT / REPLACE / [place]     ICD  . PACEMAKER IMPLANT Left 2013   and ICD  .  PORTA CATH INSERTION N/A 09/21/2018   Procedure: PORTA CATH INSERTION;  Surgeon: Algernon Huxley, MD;  Location: [place] CV LAB;  Service: Cardiovascular;  Laterality: N/A;  . VASECTOMY      Prior to Admission medications   Medication Sig Start Date End Date Taking? Authorizing Provider  acetaminophen (TYLENOL) 650 MG CR tablet Take 650 mg by mouth 2 (two) times daily.   Yes [provider]  aspirin EC 81 MG tablet Take 81 mg  by mouth daily.   Yes [provider]  desonide (DESOWEN) 0.05 % cream Apply topically 2 (two) times daily. 03/30/18  Yes Volney American, PA-C  diltiazem (CARDIZEM CD) 180 MG 24 hr capsule  05/06/18  Yes [provider]  empagliflozin (JARDIANCE) 10 MG TABS tablet Take 10 mg by mouth daily.   Yes [provider]  finasteride (PROSCAR) 5 MG tablet TAKE 1 TABLET BY MOUTH ONCE DAILY 07/12/18  Yes Volney American, PA-C  fluticasone [facility]) 50 MCG/ACT nasal spray Place 2 sprays daily into both nostrils. 10/14/17  Yes Crissman, Jeannette How, MD  furosemide (LASIX) 40 MG tablet Take 40 mg by mouth daily.    Yes [provider]  ketoconazole (NIZORAL) 2 % shampoo Apply 1 application topically 2 (two) times a week. 09/07/18  Yes Volney American, PA-C  Magnesium 400 MG TABS Take 400 mg by mouth daily.    Yes [provider]  metoprolol succinate (TOPROL-XL) 50 MG 24 hr tablet TAKE 1 TABLET BY MOUTH TWICE DAILY WITH MEALS OR IMMEDIATELY FOLLOWING MEAL 10/19/17  Yes Crissman, Jeannette How, MD  Multiple Vitamins-Minerals (CENTRUM ADULTS PO) Take 1 tablet by mouth daily.    Yes [provider]  omeprazole (PRILOSEC) 20 MG capsule Take 1 capsule (20 mg total) by mouth daily. 10/19/17  Yes Crissman, Jeannette How, MD  tamsulosin (FLOMAX) 0.4 MG CAPS capsule Take 2 capsules (0.8 mg total) by mouth daily. Patient taking differently: Take 0.4 mg by mouth daily.  07/27/18  Yes Volney American, PA-C  tiotropium (SPIRIVA) 18 MCG inhalation capsule Place 1 capsule (18 mcg total) into inhaler and inhale daily. 10/19/17  Yes Crissman, Jeannette How, MD  warfarin (COUMADIN) 4 MG tablet TAKE 1 TABLET BY MOUTH ONCE DAILY AT 6 P.M. 11/04/17  Yes Crissman, Jeannette How, MD  COLCRYS 0.6 MG tablet TAKE 1 TABLET BY MOUTH ONCE DAILY 09/13/18   Guadalupe Maple, MD  dexamethasone (DECADRON) 4 MG tablet Take 2 tablets (8 mg total) by mouth daily. Start the day after chemotherapy for 2  days. Patient not taking: Reported on 10/05/2018 09/16/18   Sindy Guadeloupe, MD  diltiazem (DILACOR XR) 180 MG 24 hr capsule Take 180 mg by mouth daily 10/19/17   Guadalupe Maple, MD  fexofenadine (ALLEGRA) 180 MG tablet Take 180 mg by mouth daily.    [provider]  lidocaine-prilocaine (EMLA) cream Apply to affected area once 09/16/18   Sindy Guadeloupe, MD  loratadine (CLARITIN) 10 MG tablet Take 1 tablet (10 mg total) by mouth daily. 07/27/18   Volney American, PA-C  LORazepam (ATIVAN) 0.5 MG tablet Take 1 tablet (0.5 mg total) by mouth every 6 (six) hours as needed (Nausea or vomiting). 09/16/18   Sindy Guadeloupe, MD  losartan (COZAAR) 25 MG tablet Take 1 tablet (25 mg total) by mouth daily. 10/19/17   Guadalupe Maple, MD  ondansetron (ZOFRAN) 8 MG tablet Take 1 tablet (8 mg total) by mouth 2 (two)  times daily as needed for refractory nausea / vomiting. Start on day 3 after chemo. 09/16/18   Sindy Guadeloupe, MD  Polyethyl Glycol-Propyl Glycol (SYSTANE OP) Place 1 drop into both eyes as needed (for dry eyes).    [provider]  potassium chloride SA (K-DUR,KLOR-CON) 20 MEQ tablet Take 1 tablet (20 mEq total) by mouth daily. 10/05/18   Sindy Guadeloupe, MD  prochlorperazine (COMPAZINE) 10 MG tablet Take 1 tablet (10 mg total) by mouth every 6 (six) hours as needed (Nausea or vomiting). 09/16/18   Sindy Guadeloupe, MD  UNABLE TO FIND CBD Hemp topical cream    [provider]  warfarin (COUMADIN) 1 MG tablet TAKE 1 TABLET BY MOUTH ONCE DAILY 08/07/18   Johnson, Megan P, DO  warfarin (COUMADIN) 3 MG tablet TAKE 1 TABLET BY MOUTH ONCE DAILY. 06/21/18   Guadalupe Maple, MD    Allergies Prednisone  Family History  Problem Relation Age of Onset  . Heart attack Mother   . Diabetes Maternal Grandmother   . Lung cancer Sister     Social History Social History   Tobacco Use  . Smoking status: Former Smoker    Types: Cigarettes    Last attempt to quit: 04/24/1991     Years since quitting: 27.4  . Smokeless tobacco: Never Used  Substance Use Topics  . Alcohol use: Yes    Alcohol/week: 2.0 standard drinks    Types: 2 Shots of liquor per week    Comment: quit December 2015/restarted-2 drinks per night  . Drug use: No    Review of Systems Constitutional: No fever/chills Eyes: No visual changes. ENT: No sore throat. Cardiovascular: Denies chest pain. Respiratory: Denies shortness of breath. Gastrointestinal: No abdominal pain.  No nausea, no vomiting.  No diarrhea.  No constipation. Genitourinary: Negative for dysuria. Musculoskeletal: Negative for neck pain.  Negative for back pain. Integumentary: Negative for rash. Neurological: Negative for headaches, focal weakness or numbness.positive for syncope   ____________________________________________   PHYSICAL EXAM:  VITAL SIGNS: ED Triage Vitals  Enc Vitals Group     BP      Pulse      Resp      Temp      Temp src      SpO2      Weight      Height      Head Circumference      Peak Flow      Pain Score      Pain Loc      Pain Edu?      Excl. in [place]?     Constitutional: Alert and oriented. Well appearing and in no acute distress. Eyes: Conjunctivae are normal. PERRL. EOMI. Head: 13 cm linear V-shaped laceration right temporal region. Mouth/Throat: Mucous membranes are moist.  Oropharynx non-erythematous. Neck: No stridor.  Cardiovascular: Normal rate, regular rhythm. Good peripheral circulation. Grossly normal heart sounds. Respiratory: Normal respiratory effort.  No retractions. Lungs CTAB. Gastrointestinal: Soft and nontender. No distention.  Musculoskeletal: No lower extremity tenderness nor edema. No gross deformities of extremities. Neurologic:  Normal speech and language. No gross focal neurologic deficits are appreciated.  Skin:  13 cm linear V shape right temporal scalp laceration. Psychiatric: Mood and affect are normal. Speech and behavior are  normal.  ____________________________________________   LABS (all labs ordered are listed, but only abnormal results are displayed)  Labs Reviewed  CBC - Abnormal; Notable for the following components:      Result  Value   RBC 3.04 (*)    Hemoglobin 9.4 (*)    HCT 28.0 (*)    All other components within normal limits  TROPONIN I - Abnormal; Notable for the following components:   Troponin I 0.08 (*)    All other components within normal limits  COMPREHENSIVE METABOLIC PANEL - Abnormal; Notable for the following components:   Sodium 129 (*)    Chloride 88 (*)    Glucose, Bld 109 (*)    BUN 32 (*)    Calcium 7.8 (*)    Total Protein 5.6 (*)    Albumin 2.7 (*)    Total Bilirubin 1.4 (*)    All other components within normal limits  PROTIME-INR - Abnormal; Notable for the following components:   Prothrombin Time 17.1 (*)    All other components within normal limits  BRAIN NATRIURETIC PEPTIDE - Abnormal; Notable for the following components:   B Natriuretic Peptide 1,977.0 (*)    All other components within normal limits   ____________________________________________  EKG  ED ECG REPORT I, [place] N Judene Logue, the attending physician, personally viewed and interpreted this ECG.   Date: 10/12/2018  EKG Time:11:31 PM  Rate: 108  Rhythm:atrial fibrillation with rapid ventricular response.  PVC.  Axis: left axis deviation  Intervals:irregular RR interval  ST&T Change: none  ____________________________________________  RADIOLOGY I, [place] N Chace Klippel, personally viewed and evaluated these images (plain radiographs) as part of my medical decision making, as well as reviewing the written report by the radiologist.  ED MD interpretation:  no evidence of traumatic intracranial injury or fracture noted on CT scan of the head.  Chest x-ray revealed persistent right-sided pleural effusion with a known right lower lobe mass  Official radiology report(s): Ct Head Wo  Contrast  Result Date: 10/12/2018 CLINICAL DATA:  Acute onset of dizziness and fall. Hit head on headboard. Laceration at the right temple. Patient on Coumadin. Initial encounter. EXAM: CT HEAD WITHOUT CONTRAST TECHNIQUE: Contiguous axial images were obtained from the base of the skull through the vertex without intravenous contrast. COMPARISON:  CT of the head performed 09/04/2018 FINDINGS: Brain: No evidence of acute infarction, hemorrhage, hydrocephalus, extra-axial collection or mass lesion / mass effect. Prominence of the ventricles and sulci reflects mild to moderate cortical volume loss. Cerebellar atrophy is noted. Scattered periventricular and subcortical white matter change likely reflects small vessel ischemic microangiopathy. A small chronic lacunar infarct is noted at the left cerebellar hemisphere. The brainstem and fourth ventricle are within normal limits. The basal ganglia are unremarkable in appearance. The cerebral hemispheres demonstrate grossly normal gray-white differentiation. No mass effect or midline shift is seen. Vascular: No hyperdense vessel or unexpected calcification. Skull: There is no evidence of fracture; visualized osseous structures are unremarkable in appearance. Sinuses/Orbits: The visualized portions of the orbits are within normal limits. The paranasal sinuses and mastoid air cells are well-aerated. Other: A prominent soft tissue laceration is noted anterior to the right ear, with soft tissue air extending below the right ear. IMPRESSION: 1. No evidence of traumatic intracranial injury or fracture. 2. Prominent soft tissue laceration anterior to the right ear, with soft tissue air extending below the right ear. 3. Mild to moderate cortical volume loss and scattered small vessel ischemic microangiopathy. 4. Small chronic lacunar infarct at the left cerebellar hemisphere. Electronically Signed   By: Garald Balding M.D.   On: 10/12/2018 00:18   Dg Chest Port 1 View  Result  Date: 10/12/2018 CLINICAL DATA:  Status post  fall, with concern for chest injury. Initial encounter. EXAM: PORTABLE CHEST 1 VIEW COMPARISON:  CT of the chest performed 08/21/2018 FINDINGS: There is a persistent small right-sided pleural effusion. The known right lower lobe mass is better characterized on prior CT. There is suggestion of underlying interstitial edema, which may be transient in nature. No pneumothorax is seen. The cardiomediastinal silhouette is borderline normal in size. The patient is status post median sternotomy. An AICD is noted overlying the left chest wall, with a single lead ending overlying the right ventricle. The patient's right chest port is noted ending about the mid SVC. No displaced rib fractures are seen. IMPRESSION: 1. Persistent small right-sided pleural effusion. Known right lower lobe mass is better characterized on prior CT. Suggestion of underlying interstitial edema, which may be transient in nature. 2. No displaced rib fracture seen. Electronically Signed   By: Garald Balding M.D.   On: 10/12/2018 00:22     Critical Care performed: .Critical Care Performed by: Gregor Hams, MD Authorized by: Gregor Hams, MD   Critical care provider statement:    Critical care time (minutes):  45   Critical care time was exclusive of:  Separately billable procedures and treating other patients and teaching time   Critical care was necessary to treat or prevent imminent or life-threatening deterioration of the following conditions:  Cardiac failure   Critical care was time spent personally by me on the following activities:  Development of treatment plan with patient or surrogate, discussions with consultants, evaluation of patient's response to treatment, examination of patient, obtaining history from patient or surrogate, ordering and performing treatments and interventions, ordering and review of laboratory studies, ordering and review of radiographic studies, pulse  oximetry, re-evaluation of patient's condition and review of old charts   I assumed direction of critical care for this patient from another provider in my specialty: no   .[place] KitchenLaceration Repair Date/Time: 10/12/2018 3:35 AM Performed by: Gregor Hams, MD Authorized by: Gregor Hams, MD   Consent:    Consent obtained:  Verbal   Consent given by:  Patient   Risks discussed:  Infection, pain, retained foreign body, poor cosmetic result and poor wound healing Anesthesia (see MAR for exact dosages):    Anesthesia method:  Local infiltration   Local anesthetic:  Lidocaine 1% w/o epi Laceration details:    Location:  Scalp   Scalp location:  R temporal   Length (cm):  13 Repair type:    Repair type:  Simple Exploration:    Hemostasis achieved with:  Direct pressure   Wound exploration: entire depth of wound probed and visualized     Contaminated: no   Treatment:    Area cleansed with:  Saline   Amount of cleaning:  Extensive   Irrigation solution:  Sterile saline   Visualized foreign bodies/material removed: no   Skin repair:    Repair method:  Sutures   Suture size:  6-0   Suture material:  Nylon   Suture technique:  Simple interrupted   Number of sutures:  13 Approximation:    Approximation:  Close Post-procedure details:    Dressing:  Sterile dressing   Patient tolerance of procedure:  Tolerated well, no immediate complications     ____________________________________________   INITIAL IMPRESSION / ASSESSMENT AND PLAN / ED COURSE  As part of my medical decision making, I reviewed the following data within the electronic MEDICAL RECORD NUMBER   77 year old male presenting with above stated history and  physical exam following syncopal episode resulting laceration to the right temporal region of the head. Wound repaired without any difficulty. During repeat evaluation patient noted to be in ventricular tachycardia while normotensive and conversant. As such patient given  amiodarone bolus and subsequent infusion. Patient had multiple episodes of witnessed ventricular tachycardia without AICD activation. Following Amiodarone bolus and infusion patient currently with no further episodesof ventricular tachycardia.patient discussed with Dr. Jannifer Franklin for hospital admission further evaluation and management. ____________________________________________  FINAL CLINICAL IMPRESSION(S) / ED DIAGNOSES  Final diagnoses:  Ventricular tachycardia ([place])  Syncope, unspecified syncope type     MEDICATIONS GIVEN DURING THIS VISIT:  Medications  amiodarone (NEXTERONE) IV bolus only 150 mg/100 mL (has no administration in time range)  amiodarone (NEXTERONE PREMIX) 360-4.14 MG/200ML-% (1.8 mg/mL) IV infusion (has no administration in time range)  amiodarone (NEXTERONE PREMIX) 360-4.14 MG/200ML-% (1.8 mg/mL) IV infusion (has no administration in time range)  amiodarone (NEXTERONE) 150-4.21 MG/100ML-% bolus (has no administration in time range)  amiodarone (NEXTERONE PREMIX) 360-4.14 MG/200ML-% (1.8 mg/mL) IV infusion (has no administration in time range)  lidocaine (PF) (XYLOCAINE) 1 % injection 5 mL (5 mLs Intradermal Given by Other 10/12/18 5749)     ED Discharge Orders    None       Note:  This document was prepared using Dragon voice recognition software and may include unintentional dictation errors.    Gregor Hams, MD 10/12/18 [phone]

## 2018-10-12 ENCOUNTER — Observation Stay: Payer: Medicare Other

## 2018-10-12 ENCOUNTER — Ambulatory Visit: Payer: Medicare Other

## 2018-10-12 ENCOUNTER — Encounter: Payer: Self-pay | Admitting: Emergency Medicine

## 2018-10-12 ENCOUNTER — Inpatient Hospital Stay: Payer: Medicare Other

## 2018-10-12 ENCOUNTER — Inpatient Hospital Stay: Payer: Medicare Other | Admitting: Hospice and Palliative Medicine

## 2018-10-12 DIAGNOSIS — S6992XA Unspecified injury of left wrist, hand and finger(s), initial encounter: Secondary | ICD-10-CM | POA: Diagnosis not present

## 2018-10-12 DIAGNOSIS — E871 Hypo-osmolality and hyponatremia: Secondary | ICD-10-CM | POA: Diagnosis not present

## 2018-10-12 DIAGNOSIS — S0990XA Unspecified injury of head, initial encounter: Secondary | ICD-10-CM | POA: Diagnosis not present

## 2018-10-12 DIAGNOSIS — S299XXA Unspecified injury of thorax, initial encounter: Secondary | ICD-10-CM | POA: Diagnosis not present

## 2018-10-12 DIAGNOSIS — Z87891 Personal history of nicotine dependence: Secondary | ICD-10-CM | POA: Diagnosis not present

## 2018-10-12 DIAGNOSIS — R55 Syncope and collapse: Secondary | ICD-10-CM | POA: Diagnosis not present

## 2018-10-12 DIAGNOSIS — I472 Ventricular tachycardia: Secondary | ICD-10-CM | POA: Diagnosis not present

## 2018-10-12 DIAGNOSIS — I471 Supraventricular tachycardia: Secondary | ICD-10-CM | POA: Diagnosis not present

## 2018-10-12 DIAGNOSIS — L8990 Pressure ulcer of unspecified site, unspecified stage: Secondary | ICD-10-CM

## 2018-10-12 DIAGNOSIS — Z95810 Presence of automatic (implantable) cardiac defibrillator: Secondary | ICD-10-CM

## 2018-10-12 DIAGNOSIS — R7989 Other specified abnormal findings of blood chemistry: Secondary | ICD-10-CM | POA: Diagnosis not present

## 2018-10-12 DIAGNOSIS — C3490 Malignant neoplasm of unspecified part of unspecified bronchus or lung: Secondary | ICD-10-CM | POA: Diagnosis not present

## 2018-10-12 DIAGNOSIS — I5023 Acute on chronic systolic (congestive) heart failure: Secondary | ICD-10-CM | POA: Diagnosis not present

## 2018-10-12 DIAGNOSIS — M79642 Pain in left hand: Secondary | ICD-10-CM | POA: Diagnosis not present

## 2018-10-12 DIAGNOSIS — R42 Dizziness and giddiness: Secondary | ICD-10-CM | POA: Diagnosis not present

## 2018-10-12 LAB — COMPREHENSIVE METABOLIC PANEL
ALT: 15 U/L (ref 0–44)
ANION GAP: 9 (ref 5–15)
AST: 25 U/L (ref 15–41)
Albumin: 2.7 g/dL — ABNORMAL LOW (ref 3.5–5.0)
Alkaline Phosphatase: 64 U/L (ref 38–126)
BUN: 32 mg/dL — ABNORMAL HIGH (ref 8–23)
CALCIUM: 7.8 mg/dL — AB (ref 8.9–10.3)
CHLORIDE: 88 mmol/L — AB (ref 98–111)
CO2: 32 mmol/L (ref 22–32)
CREATININE: 0.98 mg/dL (ref 0.61–1.24)
Glucose, Bld: 109 mg/dL — ABNORMAL HIGH (ref 70–99)
Potassium: 3.6 mmol/L (ref 3.5–5.1)
Sodium: 129 mmol/L — ABNORMAL LOW (ref 135–145)
Total Bilirubin: 1.4 mg/dL — ABNORMAL HIGH (ref 0.3–1.2)
Total Protein: 5.6 g/dL — ABNORMAL LOW (ref 6.5–8.1)

## 2018-10-12 LAB — TSH: TSH: 2.545 u[IU]/mL (ref 0.350–4.500)

## 2018-10-12 LAB — CBC
HCT: 28 % — ABNORMAL LOW (ref 39.0–52.0)
Hemoglobin: 9.4 g/dL — ABNORMAL LOW (ref 13.0–17.0)
MCH: 30.9 pg (ref 26.0–34.0)
MCHC: 33.6 g/dL (ref 30.0–36.0)
MCV: 92.1 fL (ref 80.0–100.0)
PLATELETS: 195 10*3/uL (ref 150–400)
RBC: 3.04 MIL/uL — ABNORMAL LOW (ref 4.22–5.81)
RDW: 15.1 % (ref 11.5–15.5)
WBC: 5.7 10*3/uL (ref 4.0–10.5)
nRBC: 0 % (ref 0.0–0.2)

## 2018-10-12 LAB — TROPONIN I
Troponin I: 0.06 ng/mL (ref <0.03)
Troponin I: 0.07 ng/mL (ref <0.03)
Troponin I: 0.08 ng/mL (ref <0.03)
Troponin I: 0.08 ng/mL (ref <0.03)

## 2018-10-12 LAB — GLUCOSE, CAPILLARY
Glucose-Capillary: 114 mg/dL — ABNORMAL HIGH (ref 70–99)
Glucose-Capillary: 112 mg/dL — ABNORMAL HIGH (ref 70–99)
Glucose-Capillary: 130 mg/dL — ABNORMAL HIGH (ref 70–99)
Glucose-Capillary: 145 mg/dL — ABNORMAL HIGH (ref 70–99)
Glucose-Capillary: 107 mg/dL — ABNORMAL HIGH (ref 70–99)

## 2018-10-12 LAB — BRAIN NATRIURETIC PEPTIDE: B NATRIURETIC PEPTIDE 5: 1977 pg/mL — AB (ref 0.0–100.0)

## 2018-10-12 LAB — PROTIME-INR
INR: 1.41
PROTHROMBIN TIME: 17.1 s — AB (ref 11.4–15.2)

## 2018-10-12 LAB — HEMOGLOBIN A1C
Hgb A1c MFr Bld: 6.2 % — ABNORMAL HIGH (ref 4.8–5.6)
Mean Plasma Glucose: 131.24 mg/dL

## 2018-10-12 MED ORDER — LIDOCAINE HCL (PF) 1 % IJ SOLN
5.0000 mL | Freq: Once | INTRAMUSCULAR | Status: AC
  Administered 2018-10-12: 5 mL via INTRADERMAL

## 2018-10-12 MED ORDER — DOCUSATE SODIUM 100 MG PO CAPS
100.0000 mg | ORAL_CAPSULE | Freq: Two times a day (BID) | ORAL | Status: DC
  Administered 2018-10-12 – 2018-10-15 (×7): 100 mg via ORAL
  Filled 2018-10-12 (×7): qty 1

## 2018-10-12 MED ORDER — POLYETHYL GLYCOL-PROPYL GLYCOL 0.4-0.3 % OP GEL: OPHTHALMIC | Status: DC | PRN

## 2018-10-12 MED ORDER — PROCHLORPERAZINE EDISYLATE 10 MG/2ML IJ SOLN: 10.0000 mg | Freq: Four times a day (QID) | INTRAMUSCULAR | Status: DC | PRN

## 2018-10-12 MED ORDER — POTASSIUM CHLORIDE CRYS ER 20 MEQ PO TBCR
40.0000 meq | EXTENDED_RELEASE_TABLET | ORAL | Status: AC
  Administered 2018-10-12 (×2): 40 meq via ORAL
  Filled 2018-10-12 (×2): qty 2

## 2018-10-12 MED ORDER — ONDANSETRON HCL 4 MG/2ML IJ SOLN: 4.0000 mg | Freq: Four times a day (QID) | INTRAMUSCULAR | Status: DC | PRN

## 2018-10-12 MED ORDER — INSULIN ASPART 100 UNIT/ML ~~LOC~~ SOLN
0.0000 [IU] | SUBCUTANEOUS | Status: DC
  Administered 2018-10-12 – 2018-10-13 (×4): 1 [IU] via SUBCUTANEOUS
  Filled 2018-10-12 (×4): qty 1

## 2018-10-12 MED ORDER — AMIODARONE HCL IN DEXTROSE 360-4.14 MG/200ML-% IV SOLN
30.0000 mg/h | INTRAVENOUS | Status: DC
  Administered 2018-10-12 – 2018-10-13 (×3): 30 mg/h via INTRAVENOUS
  Filled 2018-10-12 (×3): qty 200

## 2018-10-12 MED ORDER — LIDOCAINE HCL (PF) 1 % IJ SOLN
INTRAMUSCULAR | Status: AC
  Administered 2018-10-12: 5 mL via INTRADERMAL
  Filled 2018-10-12: qty 5

## 2018-10-12 MED ORDER — ARTIFICIAL TEARS OPHTHALMIC OINT
TOPICAL_OINTMENT | OPHTHALMIC | Status: DC | PRN
  Filled 2018-10-12: qty 3.5

## 2018-10-12 MED ORDER — LORAZEPAM 0.5 MG PO TABS: 0.5000 mg | ORAL_TABLET | Freq: Four times a day (QID) | ORAL | Status: DC | PRN

## 2018-10-12 MED ORDER — TAMSULOSIN HCL 0.4 MG PO CAPS
0.4000 mg | ORAL_CAPSULE | Freq: Every day | ORAL | Status: DC
  Administered 2018-10-12 – 2018-10-13 (×2): 0.4 mg via ORAL
  Filled 2018-10-12 (×2): qty 1

## 2018-10-12 MED ORDER — MAGNESIUM 400 MG PO TABS: 400.0000 mg | ORAL_TABLET | Freq: Every day | ORAL | Status: DC

## 2018-10-12 MED ORDER — WARFARIN SODIUM 3 MG PO TABS
3.0000 mg | ORAL_TABLET | Freq: Every day | ORAL | Status: DC
  Administered 2018-10-12: 3 mg via ORAL
  Filled 2018-10-12: qty 1

## 2018-10-12 MED ORDER — ACETAMINOPHEN 325 MG PO TABS: 650.0000 mg | ORAL_TABLET | Freq: Four times a day (QID) | ORAL | Status: DC | PRN

## 2018-10-12 MED ORDER — ONDANSETRON HCL 4 MG PO TABS: 4.0000 mg | ORAL_TABLET | Freq: Four times a day (QID) | ORAL | Status: DC | PRN

## 2018-10-12 MED ORDER — FUROSEMIDE 10 MG/ML IJ SOLN
60.0000 mg | INTRAMUSCULAR | Status: AC
  Administered 2018-10-12: 60 mg via INTRAVENOUS
  Filled 2018-10-12: qty 6

## 2018-10-12 MED ORDER — FINASTERIDE 5 MG PO TABS
5.0000 mg | ORAL_TABLET | Freq: Every day | ORAL | Status: DC
  Administered 2018-10-12 – 2018-10-15 (×4): 5 mg via ORAL
  Filled 2018-10-12 (×4): qty 1

## 2018-10-12 MED ORDER — INSULIN ASPART 100 UNIT/ML ~~LOC~~ SOLN: 0.0000 [IU] | Freq: Every day | SUBCUTANEOUS | Status: DC

## 2018-10-12 MED ORDER — DILTIAZEM HCL ER COATED BEADS 180 MG PO CP24
180.0000 mg | ORAL_CAPSULE | Freq: Every day | ORAL | Status: DC
  Administered 2018-10-12 – 2018-10-15 (×4): 180 mg via ORAL
  Filled 2018-10-12 (×5): qty 1

## 2018-10-12 MED ORDER — POTASSIUM CHLORIDE CRYS ER 20 MEQ PO TBCR
20.0000 meq | EXTENDED_RELEASE_TABLET | Freq: Every day | ORAL | Status: DC
  Administered 2018-10-13 – 2018-10-15 (×3): 20 meq via ORAL
  Filled 2018-10-12 (×3): qty 1

## 2018-10-12 MED ORDER — METOPROLOL SUCCINATE ER 50 MG PO TB24
50.0000 mg | ORAL_TABLET | Freq: Every day | ORAL | Status: DC
  Filled 2018-10-12: qty 1

## 2018-10-12 MED ORDER — TIOTROPIUM BROMIDE MONOHYDRATE 18 MCG IN CAPS
18.0000 ug | ORAL_CAPSULE | Freq: Every day | RESPIRATORY_TRACT | Status: DC
  Administered 2018-10-12 – 2018-10-15 (×4): 18 ug via RESPIRATORY_TRACT
  Filled 2018-10-12: qty 5

## 2018-10-12 MED ORDER — AMIODARONE HCL IN DEXTROSE 360-4.14 MG/200ML-% IV SOLN
INTRAVENOUS | Status: AC
  Administered 2018-10-12: 60 mg/h via INTRAVENOUS
  Filled 2018-10-12: qty 200

## 2018-10-12 MED ORDER — AMIODARONE HCL IN DEXTROSE 360-4.14 MG/200ML-% IV SOLN
60.0000 mg/h | INTRAVENOUS | Status: AC
  Administered 2018-10-12 (×2): 60 mg/h via INTRAVENOUS

## 2018-10-12 MED ORDER — ASPIRIN EC 81 MG PO TBEC
81.0000 mg | DELAYED_RELEASE_TABLET | Freq: Every day | ORAL | Status: DC
  Administered 2018-10-13 – 2018-10-15 (×2): 81 mg via ORAL
  Filled 2018-10-12 (×4): qty 1

## 2018-10-12 MED ORDER — AMIODARONE IV BOLUS ONLY 150 MG/100ML
INTRAVENOUS | Status: AC
  Administered 2018-10-12: 150 mg via INTRAVENOUS
  Filled 2018-10-12: qty 100

## 2018-10-12 MED ORDER — AMIODARONE IV BOLUS ONLY 150 MG/100ML
150.0000 mg | Freq: Once | INTRAVENOUS | Status: AC
  Administered 2018-10-12: 150 mg via INTRAVENOUS

## 2018-10-12 MED ORDER — DIPHENHYDRAMINE HCL 25 MG PO CAPS
25.0000 mg | ORAL_CAPSULE | Freq: Every evening | ORAL | Status: DC | PRN
  Administered 2018-10-12: 25 mg via ORAL
  Administered 2018-10-14: 50 mg via ORAL
  Administered 2018-10-14: 25 mg via ORAL
  Filled 2018-10-12 (×3): qty 1

## 2018-10-12 MED ORDER — PANTOPRAZOLE SODIUM 40 MG PO TBEC
40.0000 mg | DELAYED_RELEASE_TABLET | Freq: Every day | ORAL | Status: DC
  Administered 2018-10-13 – 2018-10-15 (×3): 40 mg via ORAL
  Filled 2018-10-12 (×3): qty 1

## 2018-10-12 MED ORDER — MAGNESIUM OXIDE 400 (241.3 MG) MG PO TABS
400.0000 mg | ORAL_TABLET | Freq: Every day | ORAL | Status: DC
  Administered 2018-10-13 – 2018-10-15 (×3): 400 mg via ORAL
  Filled 2018-10-12 (×3): qty 1

## 2018-10-12 MED ORDER — ACETAMINOPHEN 650 MG RE SUPP: 650.0000 mg | Freq: Four times a day (QID) | RECTAL | Status: DC | PRN

## 2018-10-12 NOTE — Progress Notes (Signed)
Advance care planning  Purpose of Encounter Ventricular tachycardia, lung cancer and CODE STATUS discussion  Parties in Attendance Patient, wife and daughter at bedside  Patients Decisional capacity Patient is alert and awake.  Able to make medical decisions.  Patient has healthcare power of attorney designated as his daughter.  She is available at bedside. Has advanced directives in place.  We discussed regarding lung cancer with which they are waiting to hear regarding prognosis from the oncologist. [place] discussed and patient would like resuscitation and intubation if needed at this time.  We also discussed about revisiting CODE STATUS with any change in health status.  Full code  Time spent - 17 minutes

## 2018-10-12 NOTE — Progress Notes (Signed)
Patient can have heart healthy diet per [place], [place] with Dr. Ubaldo Glassing.

## 2018-10-12 NOTE — H&P (Signed)
Kirk Jones is an 77 y.o. male.   Chief Complaint: Syncope HPI: Patient with past medical history of lung cancer, COPD, CHF, atrial fibrillation, hypertension and prosthetic aortic valve presents to the emergency department with a head injury following syncope.  The patient recalls standing from a chair and rapidly losing consciousness.  He suspects that he hit his head on some furniture.  CT of his head in the emergency department showed no cute intracranial process.  He received 16 sutures to the wound.  The patient denies chest pain or shortness of breath but some strips in the emergency department did detect multiple PVCs as well as ventricular tachycardia.  The patient does not recall his AICD firing which gave the treatment team pause for concern and prompted the emergency department staff to call the hospitalist service for admission.  Past Medical History:  Diagnosis Date  . AICD (automatic cardioverter/defibrillator) present   . Aortic valve regurgitation   . Atrial fibrillation ([place])   . Barrett's esophagus   . Cancer ([place])    sate III lung  . CHF (congestive heart failure) ([place])   . COPD (chronic obstructive pulmonary disease) ([place])   . Diverticulosis   . GERD (gastroesophageal reflux disease)   . Gout   . Headache    aura only  . Heart murmur   . Hematuria   . Hernia of abdominal cavity   . History of prosthetic heart valve   . Hyperlipidemia   . Hypertension   . Nodular prostate with urinary obstruction   . Presence of permanent cardiac pacemaker   . Renal insufficiency   . Substance abuse ([place])    alcohol    Past Surgical History:  Procedure Laterality Date  . CARDIAC VALVE REPLACEMENT N/A 2004  . CARPAL TUNNEL RELEASE Right 2015  . CATARACT EXTRACTION Right 2017  . COLONOSCOPY WITH PROPOFOL N/A 2014  . CORONARY ANGIOPLASTY     stints x2  . EYE SURGERY     cross eyed  . FOOT SURGERY Bilateral    [place]  . FRACTURE SURGERY Right    [place]  . INGUINAL  HERNIA REPAIR Right 09/16/2017   Procedure: HERNIA REPAIR INGUINAL ADULT;  Surgeon: Christene Lye, MD;  Location: [facility] ORS;  Service: General;  Laterality: Right;  . INSERT / REPLACE / [place]     ICD  . PACEMAKER IMPLANT Left 2013   and ICD  . PORTA CATH INSERTION N/A 09/21/2018   Procedure: PORTA CATH INSERTION;  Surgeon: Algernon Huxley, MD;  Location: [facility];  Service: Cardiovascular;  Laterality: N/A;  . VASECTOMY      Family History  Problem Relation Age of Onset  . Heart attack Mother   . Diabetes Maternal Grandmother   . Lung cancer Sister    Social History:  reports that he quit smoking about 27 years ago. His smoking use included cigarettes. He has never used smokeless tobacco. He reports that he drinks about 2.0 standard drinks of alcohol per week. He reports that he does not use drugs.  Allergies:  Allergies  Allergen Reactions  . Prednisone Other (See Comments)    Reaction: "organs shutting down"    Medications Prior to Admission  Medication Sig Dispense Refill  . acetaminophen (TYLENOL) 650 MG CR tablet Take 650 mg by mouth 2 (two) times daily.    Marland Kitchen aspirin EC 81 MG tablet Take 81 mg by mouth daily.    Marland Kitchen COLCRYS 0.6 MG tablet TAKE 1  TABLET BY MOUTH ONCE DAILY 30 tablet 1  . desonide (DESOWEN) 0.05 % cream Apply topically 2 (two) times daily. 60 g 0  . diltiazem (CARDIZEM CD) 180 MG 24 hr capsule   3  . empagliflozin (JARDIANCE) 10 MG TABS tablet Take 10 mg by mouth daily.    . finasteride (PROSCAR) 5 MG tablet TAKE 1 TABLET BY MOUTH ONCE DAILY 90 tablet 2  . fluticasone (FLONASE) 50 MCG/ACT nasal spray Place 2 sprays daily into both nostrils. 16 g 12  . furosemide (LASIX) 40 MG tablet Take 40 mg by mouth daily.     Marland Kitchen ketoconazole (NIZORAL) 2 % shampoo Apply 1 application topically 2 (two) times a week. 120 mL 2  . losartan (COZAAR) 25 MG tablet Take 1 tablet (25 mg total) by mouth daily. 90 tablet 4  . Magnesium 400 MG TABS Take 400 mg  by mouth daily.     . metoprolol succinate (TOPROL-XL) 50 MG 24 hr tablet TAKE 1 TABLET BY MOUTH TWICE DAILY WITH MEALS OR IMMEDIATELY FOLLOWING MEAL 180 tablet 4  . Multiple Vitamins-Minerals (CENTRUM ADULTS PO) Take 1 tablet by mouth daily.     Marland Kitchen omeprazole (PRILOSEC) 20 MG capsule Take 1 capsule (20 mg total) by mouth daily. 90 capsule 4  . potassium chloride SA (K-DUR,KLOR-CON) 20 MEQ tablet Take 1 tablet (20 mEq total) by mouth daily. 4 tablet 0  . prochlorperazine (COMPAZINE) 10 MG tablet Take 1 tablet (10 mg total) by mouth every 6 (six) hours as needed (Nausea or vomiting). 30 tablet 1  . tamsulosin (FLOMAX) 0.4 MG CAPS capsule Take 2 capsules (0.8 mg total) by mouth daily. (Patient taking differently: Take 0.4 mg by mouth daily. ) 90 capsule 4  . tiotropium (SPIRIVA) 18 MCG inhalation capsule Place 1 capsule (18 mcg total) into inhaler and inhale daily. 90 capsule 4  . warfarin (COUMADIN) 1 MG tablet TAKE 1 TABLET BY MOUTH ONCE DAILY 30 tablet 12  . warfarin (COUMADIN) 3 MG tablet TAKE 1 TABLET BY MOUTH ONCE DAILY. 30 tablet 2  . dexamethasone (DECADRON) 4 MG tablet Take 2 tablets (8 mg total) by mouth daily. Start the day after chemotherapy for 2 days. (Patient not taking: Reported on 10/05/2018) 30 tablet 1  . diltiazem (DILACOR XR) 180 MG 24 hr capsule Take 180 mg by mouth daily (Patient not taking: Reported on 10/12/2018) 90 capsule 4  . fexofenadine (ALLEGRA) 180 MG tablet Take 180 mg by mouth daily.    Marland Kitchen lidocaine-prilocaine (EMLA) cream Apply to affected area once 30 g 3  . loratadine (CLARITIN) 10 MG tablet Take 1 tablet (10 mg total) by mouth daily. 90 tablet 3  . LORazepam (ATIVAN) 0.5 MG tablet Take 1 tablet (0.5 mg total) by mouth every 6 (six) hours as needed (Nausea or vomiting). 30 tablet 0  . ondansetron (ZOFRAN) 8 MG tablet Take 1 tablet (8 mg total) by mouth 2 (two) times daily as needed for refractory nausea / vomiting. Start on day 3 after chemo. 30 tablet 1  . Polyethyl  Glycol-Propyl Glycol (SYSTANE OP) Place 1 drop into both eyes as needed (for dry eyes).    Marland Kitchen UNABLE TO FIND CBD Hemp topical cream    . warfarin (COUMADIN) 4 MG tablet TAKE 1 TABLET BY MOUTH ONCE DAILY AT 6 P.M. 30 tablet 3    Results for orders placed or performed during the hospital encounter of 10/11/18 (from the past 48 hour(s))  Brain natriuretic peptide  Status: Abnormal   Collection Time: 10/11/18 11:53 PM  Result Value Ref Range   B Natriuretic Peptide 1,977.0 (H) 0.0 - 100.0 pg/mL    Comment: Performed at [facility], [address] [place], [place] [address]  CBC     Status: Abnormal   Collection Time: 10/11/18 11:54 PM  Result Value Ref Range   WBC 5.7 4.0 - 10.5 K/uL   RBC 3.04 (L) 4.22 - 5.81 MIL/uL   Hemoglobin 9.4 (L) 13.0 - 17.0 g/dL   HCT 28.0 (L) 39.0 - 52.0 %   MCV 92.1 80.0 - 100.0 fL   MCH 30.9 26.0 - 34.0 pg   MCHC 33.6 30.0 - 36.0 g/dL   RDW 15.1 11.5 - 15.5 %   Platelets 195 150 - 400 K/uL   nRBC 0.0 0.0 - 0.2 %    Comment: Performed at [facility], [address] [place], [place] [address]  Troponin I - Once     Status: Abnormal   Collection Time: 10/11/18 11:54 PM  Result Value Ref Range   Troponin I 0.08 (HH) <0.03 ng/mL    Comment: CRITICAL RESULT CALLED TO, READ BACK BY AND VERIFIED WITH COLE AMORIELLO 10/12/18 @ [place] Performed at [facility], [address] [place], [place] [address]   Comprehensive metabolic panel     Status: Abnormal   Collection Time: 10/11/18 11:54 PM  Result Value Ref Range   Sodium 129 (L) 135 - 145 mmol/L   Potassium 3.6 3.5 - 5.1 mmol/L   Chloride 88 (L) 98 - 111 mmol/L   CO2 32 22 - 32 mmol/L   Glucose, Bld 109 (H) 70 - 99 mg/dL   BUN 32 (H) 8 - 23 mg/dL   Creatinine, Ser 0.98 0.61 - 1.24 mg/dL   Calcium 7.8 (L) 8.9 - 10.3 mg/dL   Total Protein 5.6 (L) 6.5 - 8.1 g/dL   Albumin 2.7 (L) 3.5 - 5.0 g/dL   AST 25 15 - 41 U/L   ALT 15 0 - 44 U/L   Alkaline Phosphatase 64 38 -  126 U/L   Total Bilirubin 1.4 (H) 0.3 - 1.2 mg/dL   GFR calc non Af Amer >60 >60 mL/min   GFR calc Af Amer >60 >60 mL/min    Comment: (NOTE) The eGFR has been calculated using the CKD EPI equation. This calculation has not been validated in all clinical situations. eGFR's persistently <60 mL/min signify possible Chronic Kidney Disease.    Anion gap 9 5 - 15    Comment: Performed at [facility], [address] [place], [place] [address]  Protime-INR     Status: Abnormal   Collection Time: 10/11/18 11:54 PM  Result Value Ref Range   Prothrombin Time 17.1 (H) 11.4 - 15.2 seconds   INR 1.41     Comment: Performed at [facility], [address] [place], [place] [address]   Ct Head Wo Contrast  Result Date: 10/12/2018 CLINICAL DATA:  Acute onset of dizziness and fall. Hit head on headboard. Laceration at the right temple. Patient on Coumadin. Initial encounter. EXAM: CT HEAD WITHOUT CONTRAST TECHNIQUE: Contiguous axial images were obtained from the base of the skull through the vertex without intravenous contrast. COMPARISON:  CT of the head performed 09/04/2018 FINDINGS: Brain: No evidence of acute infarction, hemorrhage, hydrocephalus, extra-axial collection or mass lesion / mass effect. Prominence of the ventricles and sulci reflects mild to moderate cortical volume loss. Cerebellar atrophy is noted.  Scattered periventricular and subcortical white matter change likely reflects small vessel ischemic microangiopathy. A small chronic lacunar infarct is noted at the left cerebellar hemisphere. The brainstem and fourth ventricle are within normal limits. The basal ganglia are unremarkable in appearance. The cerebral hemispheres demonstrate grossly normal gray-white differentiation. No mass effect or midline shift is seen. Vascular: No hyperdense vessel or unexpected calcification. Skull: There is no evidence of fracture; visualized osseous structures are unremarkable in  appearance. Sinuses/Orbits: The visualized portions of the orbits are within normal limits. The paranasal sinuses and mastoid air cells are well-aerated. Other: A prominent soft tissue laceration is noted anterior to the right ear, with soft tissue air extending below the right ear. IMPRESSION: 1. No evidence of traumatic intracranial injury or fracture. 2. Prominent soft tissue laceration anterior to the right ear, with soft tissue air extending below the right ear. 3. Mild to moderate cortical volume loss and scattered small vessel ischemic microangiopathy. 4. Small chronic lacunar infarct at the left cerebellar hemisphere. Electronically Signed   By: Garald Balding M.D.   On: 10/12/2018 00:18   Dg Chest Port 1 View  Result Date: 10/12/2018 CLINICAL DATA:  Status post fall, with concern for chest injury. Initial encounter. EXAM: PORTABLE CHEST 1 VIEW COMPARISON:  CT of the chest performed 08/21/2018 FINDINGS: There is a persistent small right-sided pleural effusion. The known right lower lobe mass is better characterized on prior CT. There is suggestion of underlying interstitial edema, which may be transient in nature. No pneumothorax is seen. The cardiomediastinal silhouette is borderline normal in size. The patient is status post median sternotomy. An AICD is noted overlying the left chest wall, with a single lead ending overlying the right ventricle. The patient's right chest port is noted ending about the mid SVC. No displaced rib fractures are seen. IMPRESSION: 1. Persistent small right-sided pleural effusion. Known right lower lobe mass is better characterized on prior CT. Suggestion of underlying interstitial edema, which may be transient in nature. 2. No displaced rib fracture seen. Electronically Signed   By: Garald Balding M.D.   On: 10/12/2018 00:22    Review of Systems  Constitutional: Negative for chills and fever.  HENT: Negative for sore throat and tinnitus.   Eyes: Negative for blurred  vision and redness.  Respiratory: Negative for cough and shortness of breath.   Cardiovascular: Negative for chest pain, palpitations, orthopnea and PND.  Gastrointestinal: Negative for abdominal pain, diarrhea, nausea and vomiting.  Genitourinary: Negative for dysuria, frequency and urgency.  Musculoskeletal: Negative for joint pain and myalgias.  Skin: Negative for rash.       No lesions  Neurological: Positive for loss of consciousness. Negative for speech change, focal weakness and weakness.  Endo/Heme/Allergies: Does not bruise/bleed easily.       No temperature intolerance  Psychiatric/Behavioral: Negative for depression and suicidal ideas.    Blood pressure 99/71, pulse (!) 56, temperature 98.5 F (36.9 C), temperature source Oral, resp. rate 14, height 5' 3" (1.6 m), weight 70 kg, SpO2 100 %. Physical Exam  Vitals reviewed. Constitutional: He is oriented to person, place, and time. He appears well-developed and well-nourished. No distress.  HENT:  Head: Normocephalic.  Mouth/Throat: Oropharynx is clear and moist.  Laceration to right temple sutured with good approximation  Eyes: Pupils are equal, round, and reactive to light. Conjunctivae and EOM are normal. No scleral icterus.  Neck: Normal range of motion. Neck supple. No JVD present. No tracheal deviation present. No thyromegaly present.  Cardiovascular: Normal rate, regular rhythm and normal heart sounds. Exam reveals no gallop and no friction rub.  No murmur heard. Respiratory: Effort normal and breath sounds normal. No respiratory distress.  GI: Soft. Bowel sounds are normal. He exhibits no distension. There is no tenderness.  Genitourinary:  Genitourinary Comments: Deferred  Musculoskeletal: Normal range of motion. He exhibits no edema.  Lymphadenopathy:    He has no cervical adenopathy.  Neurological: He is alert and oriented to person, place, and time. No cranial nerve deficit.  Skin: Skin is warm and dry. No rash  noted. No erythema.  Psychiatric: He has a normal mood and affect. His behavior is normal. Judgment and thought content normal.     Assessment/Plan This is a 77 year old male admitted for syncope. 1.  Syncope: Suffered laceration to forehead; no acute intracranial abnormality.  Some ventricular tachycardia seen on telemetry in the emergency department.  AICD did not fire.  Amiodarone started in the emergency department. Consult cardiology. 2.  Elevated troponin: secondary to tachycardia preceding or following syncope.  Continue to follow cardiac biomarkers.  Monitor telemetry.   3.  CHF: Acute on chronic; systolic.  Last EF 50% with moderate mitral regurg 4.  Hyponatremia: Chronic; secondary to lung cancer 5.  Lung cancer: Status post cycle 2 of carbo/Taxol 6.  Diabetes mellitus type 2: Hold oral hypoglycemic agents.  Check hemoglobin A1c.  Sliding scale insulin while hospitalized 7.  Atrial fibrillation: Inconsistently rate controlled. Continue diltiazem for now.  Continue warfarin (status post aortic valve replacement) 8.  Hypertension: Controlled (occasionally soft BP).  Reconcile meds when current prescription list is verified. 9.  DVT prophylaxis: Warfarin 10.  GI prophylaxis: None The patient is a full code.  Time spent on admission orders and patient care approximately 45 minutes  Harrie Foreman, MD 10/12/2018, 4:33 AM

## 2018-10-12 NOTE — Progress Notes (Signed)
ANTICOAGULATION CONSULT NOTE - Initial Consult  Pharmacy Consult for warfarin dosing Indication: mechanical valve  Allergies  Allergen Reactions  . Prednisone Other (See Comments)    Reaction: "organs shutting down"    Patient Measurements: Height: 5\' 3"  (160 cm) Weight: 154 lb 5.2 oz (70 kg) IBW/kg (Calculated) : 56.9 Heparin Dosing Weight: n/a  Vital Signs: Temp: 98.5 F (36.9 C) (11/13 2347) Temp Source: Oral (11/13 2347) BP: 109/60 (11/14 0415) Pulse Rate: 30 (11/14 0415)  Labs: Recent Labs    10/11/18 2354  HGB 9.4*  HCT 28.0*  PLT 195  LABPROT 17.1*  INR 1.41  CREATININE 0.98  TROPONINI 0.08*    Estimated Creatinine Clearance: 55.4 mL/min (by C-G formula based on SCr of 0.98 mg/dL).   Medical History: Past Medical History:  Diagnosis Date  . AICD (automatic cardioverter/defibrillator) present   . Aortic valve regurgitation   . Atrial fibrillation ([place])   . Barrett's esophagus   . Cancer ([place])    sate III lung  . CHF (congestive heart failure) ([place])   . COPD (chronic obstructive pulmonary disease) ([place])   . Diverticulosis   . GERD (gastroesophageal reflux disease)   . Gout   . Headache    aura only  . Heart murmur   . Hematuria   . Hernia of abdominal cavity   . History of prosthetic heart valve   . Hyperlipidemia   . Hypertension   . Nodular prostate with urinary obstruction   . Presence of permanent cardiac pacemaker   . Renal insufficiency   . Substance abuse ([place])    alcohol    Medications:  Home dose unclear from med rec.  Assessment:  Goal of Therapy:  INR 2.5-3.5    Plan:  Will start conservatively on 3 mg daily due to amiodarone given at admission. Daily INR checks.  Japji Kok S 10/12/2018,5:13 AM

## 2018-10-12 NOTE — Progress Notes (Addendum)
Dr. Darvin Neighbours notified of BP 81/54. Patient stating he does not feel any different than he usually does. Per MD, discontinue order for metoprolol. Amio gtt infusing. No further orders. Will continue to monitor.

## 2018-10-12 NOTE — ED Notes (Signed)
Per Dr Owens Shark pt had runs of vtach and pvcs. Gave verbal order for amiodarone 160 mg bolus and 360 mg infusion. See MAR.

## 2018-10-12 NOTE — Consult Note (Signed)
Hematology/Oncology Consult note [facility] [phone] [phone]  Patient Care Team: Guadalupe Maple, MD as PCP - General (Family Medicine) Murlean Iba, MD (Internal Medicine) Isaias Cowman, MD as Consulting Physician (Cardiology) Christene Lye, MD (General Surgery) Telford Nab, RN as Registered Nurse Knox Royalty, RN as [place] [facility]   Name of the patient: Kirk Jones  [id number]  28-Apr-1941    Reason for consult" h/o stage 3 lung cancer admitted for syncope and fall   Requesting physician: Dr. Darvin Neighbours  Date of visit: 10/12/2018    History of presenting illness-patient is a 77 year old male with a history of stage III lung cancer who is currently undergoing concurrent chemoradiation.  He has received 2 cycles of weekly carbotaxol so far and was supposed to get his third dose today.  He was admitted to the hospital after he felt lightheaded at home and had a syncopal episode resulting in head injury requiring sutures.  His AICD did not fire during this time.  He has multiple other comorbidities including coronary artery disease status post cardiac cath, ischemic cardiomyopathy status post ICD insertion, paroxysmal A. fib currently on Coumadin, aortic valve replacement and chronic kidney disease among other medical problems.  Patient feels better since admission.  He is sitting in his chair and denies any complaints.  He does have some baseline bilateral leg swelling  ECOG PS- 2  Pain scale- 0   Review of systems- Review of Systems  Constitutional: Positive for malaise/fatigue. Negative for chills, fever and weight loss.  HENT: Negative for congestion, ear discharge and nosebleeds.   Eyes: Negative for blurred vision.  Respiratory: Negative for cough, hemoptysis, sputum production, shortness of breath and wheezing.   Cardiovascular: Positive for leg swelling. Negative for chest pain,  palpitations, orthopnea and claudication.  Gastrointestinal: Negative for abdominal pain, blood in stool, constipation, diarrhea, heartburn, melena, nausea and vomiting.  Genitourinary: Negative for dysuria, flank pain, frequency, hematuria and urgency.  Musculoskeletal: Negative for back pain, joint pain and myalgias.  Skin: Negative for rash.  Neurological: Negative for dizziness, tingling, focal weakness, seizures, weakness and headaches.  Endo/Heme/Allergies: Does not bruise/bleed easily.  Psychiatric/Behavioral: Negative for depression and suicidal ideas. The patient does not have insomnia.     Allergies  Allergen Reactions  . Prednisone Other (See Comments)    Reaction: "organs shutting down"    Patient Active Problem List   Diagnosis Date Noted  . Syncope 10/12/2018  . Pressure injury of skin 10/12/2018  . Squamous cell carcinoma of right lung ([place]) 09/16/2018  . Lung mass 08/22/2018  . Exertional shortness of breath 06/07/2018  . Peripheral edema 06/07/2018  . Allergic rhinitis 02/12/2018  . Advanced care planning/counseling discussion 10/19/2017  . Expressive aphasia 06/13/2017  . Hx of adenomatous colonic polyps 02/18/2017  . Bilateral cataracts 08/18/2016  . CAD (coronary artery disease) 10/06/2015  . Pacemaker 10/06/2015  . Automatic implantable cardioverter-defibrillator in situ 10/06/2015  . Nodular prostate with urinary obstruction 10/06/2015  . Senile purpura ([place]) 10/06/2015  . Barrett's esophagus 05/28/2015  . Hypertension 05/28/2015  . COPD (chronic obstructive pulmonary disease) ([place]) 05/28/2015  . Hyperlipidemia 05/28/2015  . Diverticulosis 05/28/2015  . CKD (chronic kidney disease), stage III ([place]) 05/28/2015  . Gout 05/28/2015  . History of prosthetic heart valve 05/13/2015  . Atrial fibrillation ([place]) 05/13/2015  . CVA (cerebral vascular accident) ([place]) 07/05/2014  . Systolic congestive heart failure ([place]) 07/05/2014  . History of TIA (transient  ischemic attack)  02/12/2013  . Warfarin anticoagulation 02/12/2013     Past Medical History:  Diagnosis Date  . AICD (automatic cardioverter/defibrillator) present   . Aortic valve regurgitation   . Atrial fibrillation ([place])   . Barrett's esophagus   . Cancer ([place])    sate III lung  . CHF (congestive heart failure) ([place])   . COPD (chronic obstructive pulmonary disease) ([place])   . Diverticulosis   . Dysrhythmia   . GERD (gastroesophageal reflux disease)   . Gout   . Headache    aura only  . Heart murmur   . Hematuria   . Hernia of abdominal cavity   . History of prosthetic heart valve   . Hyperlipidemia   . Hypertension   . Nodular prostate with urinary obstruction   . Presence of permanent cardiac pacemaker   . Renal insufficiency   . Substance abuse ([place])    alcohol     Past Surgical History:  Procedure Laterality Date  . CARDIAC VALVE REPLACEMENT N/A 2004  . CARPAL TUNNEL RELEASE Right 2015  . CATARACT EXTRACTION Right 2017  . COLONOSCOPY WITH PROPOFOL N/A 2014  . CORONARY ANGIOPLASTY     stints x2  . EYE SURGERY     cross eyed  . FOOT SURGERY Bilateral    [place]  . FRACTURE SURGERY Right    [place]  . INGUINAL HERNIA REPAIR Right 09/16/2017   Procedure: HERNIA REPAIR INGUINAL ADULT;  Surgeon: Christene Lye, MD;  Location: [facility] ORS;  Service: General;  Laterality: Right;  . INSERT / REPLACE / [place]     ICD  . PACEMAKER IMPLANT Left 2013   and ICD  . PORTA CATH INSERTION N/A 09/21/2018   Procedure: PORTA CATH INSERTION;  Surgeon: Algernon Huxley, MD;  Location: [facility];  Service: Cardiovascular;  Laterality: N/A;  . VASECTOMY      Social History   Socioeconomic History  . Marital status: Married    Spouse name: Belenda Cruise   . Number of children: 5  . Years of education: Not on file  . Highest education level: Not on file  Occupational History  . Not on file  Social Needs  . Financial resource strain: Not hard at all    . Food insecurity:    Worry: Never true    Inability: Never true  . Transportation needs:    Medical: No    Non-medical: No  Tobacco Use  . Smoking status: Former Smoker    Types: Cigarettes    Last attempt to quit: 04/24/1991    Years since quitting: 27.4  . Smokeless tobacco: Never Used  Substance and Sexual Activity  . Alcohol use: Yes    Alcohol/week: 2.0 standard drinks    Types: 2 Shots of liquor per week    Comment: quit December 2015/restarted-2 drinks per night  . Drug use: No  . Sexual activity: Never  Lifestyle  . Physical activity:    Days per week: 0 days    Minutes per session: 0 min  . Stress: Not at all  Relationships  . Social connections:    Talks on phone: Once a week    Gets together: Once a week    Attends religious service: More than 4 times per year    Active member of club or organization: No    Attends meetings of clubs or organizations: Never    Relationship status: Married  . Intimate partner violence:    Fear of current or ex  partner: No    Emotionally abused: No    Physically abused: No    Forced sexual activity: No  Other Topics Concern  . Not on file  Social History Narrative  . Not on file     Family History  Problem Relation Age of Onset  . Heart attack Mother   . Diabetes Maternal Grandmother   . Lung cancer Sister      Current Facility-Administered Medications:  .  acetaminophen (TYLENOL) tablet 650 mg, 650 mg, Oral, Q6H PRN **OR** acetaminophen (TYLENOL) suppository 650 mg, 650 mg, Rectal, Q6H PRN, Harrie Foreman, MD .  amiodarone (NEXTERONE PREMIX) 360-4.14 MG/200ML-% (1.8 mg/mL) IV infusion, 30 mg/hr, Intravenous, Continuous, Harrie Foreman, MD, Last Rate: 16.67 mL/hr at 10/12/18 1653, 30 mg/hr at 10/12/18 1653 .  artificial tears (LACRILUBE) ophthalmic ointment, , Both Eyes, PRN, Sudini, Srikar, MD .  aspirin EC tablet 81 mg, 81 mg, Oral, Daily, Harrie Foreman, MD .  diltiazem (CARDIZEM CD) 24 hr capsule 180  mg, 180 mg, Oral, Daily, Harrie Foreman, MD, 180 mg at 10/12/18 0160 .  diphenhydrAMINE (BENADRYL) capsule 25-50 mg, 25-50 mg, Oral, QHS PRN, Lance Coon, MD, 25 mg at 10/12/18 2233 .  docusate sodium (COLACE) capsule 100 mg, 100 mg, Oral, BID, Harrie Foreman, MD, 100 mg at 10/12/18 2149 .  finasteride (PROSCAR) tablet 5 mg, 5 mg, Oral, Daily, Sudini, Srikar, MD, 5 mg at 10/12/18 1337 .  insulin aspart (novoLOG) injection 0-5 Units, 0-5 Units, Subcutaneous, QHS, Harrie Foreman, MD .  insulin aspart (novoLOG) injection 0-9 Units, 0-9 Units, Subcutaneous, Q4H, Harrie Foreman, MD, 1 Units at 10/12/18 1725 .  LORazepam (ATIVAN) tablet 0.5 mg, 0.5 mg, Oral, Q6H PRN, Sudini, Srikar, MD .  magnesium oxide (MAG-OX) tablet 400 mg, 400 mg, Oral, Daily, Sudini, Srikar, MD .  ondansetron (ZOFRAN) tablet 4 mg, 4 mg, Oral, Q6H PRN **OR** ondansetron (ZOFRAN) injection 4 mg, 4 mg, Intravenous, Q6H PRN, Harrie Foreman, MD .  pantoprazole (PROTONIX) EC tablet 40 mg, 40 mg, Oral, Daily, Sudini, Srikar, MD .  potassium chloride SA (K-DUR,KLOR-CON) CR tablet 20 mEq, 20 mEq, Oral, Daily, Sudini, Srikar, MD .  prochlorperazine (COMPAZINE) injection 10 mg, 10 mg, Intravenous, Q6H PRN, Harrie Foreman, MD .  tamsulosin [facility]) capsule 0.4 mg, 0.4 mg, Oral, Daily, Sudini, Srikar, MD, 0.4 mg at 10/12/18 1337 .  tiotropium (SPIRIVA) inhalation capsule ([facility] use ONLY) 18 mcg, 18 mcg, Inhalation, Daily, Harrie Foreman, MD, 18 mcg at 10/12/18 0906 .  warfarin (COUMADIN) tablet 3 mg, 3 mg, Oral, q1800, Harrie Foreman, MD, 3 mg at 10/12/18 1725  Facility-Administered Medications Ordered in Other Encounters:  .  0.9 %  sodium chloride infusion, , Intravenous, Continuous, Antionette Char   Physical exam:  Vitals:   10/12/18 1952 10/12/18 1954 10/12/18 2006 10/12/18 2234  BP: (!) 88/53 (!) 84/53 (!) 92/56 105/65  Pulse: 81 84 87 81  Resp: 18     Temp: 98 F (36.7 C)     TempSrc: Oral      SpO2: 100%     Weight:      Height:       Physical Exam  Constitutional: He is oriented to person, place, and time. He appears well-developed and well-nourished.  HENT:  Head: Normocephalic and atraumatic.  Eyes: Pupils are equal, round, and reactive to light. EOM are normal.  Neck: Normal range of motion.  Cardiovascular:  Irregular, normal rate  Pulmonary/Chest: Effort  normal and breath sounds normal.  Abdominal: Soft. Bowel sounds are normal.  Musculoskeletal: He exhibits edema.  Neurological: He is alert and oriented to person, place, and time.  Skin: Skin is warm and dry.       CMP Latest Ref Rng & Units 10/11/2018  Glucose 70 - 99 mg/dL 109(H)  BUN 8 - 23 mg/dL 32(H)  Creatinine 0.61 - 1.24 mg/dL 0.98  Sodium 135 - 145 mmol/L 129(L)  Potassium 3.5 - 5.1 mmol/L 3.6  Chloride 98 - 111 mmol/L 88(L)  CO2 22 - 32 mmol/L 32  Calcium 8.9 - 10.3 mg/dL 7.8(L)  Total Protein 6.5 - 8.1 g/dL 5.6(L)  Total Bilirubin 0.3 - 1.2 mg/dL 1.4(H)  Alkaline Phos 38 - 126 U/L 64  AST 15 - 41 U/L 25  ALT 0 - 44 U/L 15   CBC Latest Ref Rng & Units 10/11/2018  WBC 4.0 - 10.5 K/uL 5.7  Hemoglobin 13.0 - 17.0 g/dL 9.4(L)  Hematocrit 39.0 - 52.0 % 28.0(L)  Platelets 150 - 400 K/uL 195    @IMAGES @  Ct Head Wo Contrast  Result Date: 10/12/2018 CLINICAL DATA:  Acute onset of dizziness and fall. Hit head on headboard. Laceration at the right temple. Patient on Coumadin. Initial encounter. EXAM: CT HEAD WITHOUT CONTRAST TECHNIQUE: Contiguous axial images were obtained from the base of the skull through the vertex without intravenous contrast. COMPARISON:  CT of the head performed 09/04/2018 FINDINGS: Brain: No evidence of acute infarction, hemorrhage, hydrocephalus, extra-axial collection or mass lesion / mass effect. Prominence of the ventricles and sulci reflects mild to moderate cortical volume loss. Cerebellar atrophy is noted. Scattered periventricular and subcortical white matter change  likely reflects small vessel ischemic microangiopathy. A small chronic lacunar infarct is noted at the left cerebellar hemisphere. The brainstem and fourth ventricle are within normal limits. The basal ganglia are unremarkable in appearance. The cerebral hemispheres demonstrate grossly normal gray-white differentiation. No mass effect or midline shift is seen. Vascular: No hyperdense vessel or unexpected calcification. Skull: There is no evidence of fracture; visualized osseous structures are unremarkable in appearance. Sinuses/Orbits: The visualized portions of the orbits are within normal limits. The paranasal sinuses and mastoid air cells are well-aerated. Other: A prominent soft tissue laceration is noted anterior to the right ear, with soft tissue air extending below the right ear. IMPRESSION: 1. No evidence of traumatic intracranial injury or fracture. 2. Prominent soft tissue laceration anterior to the right ear, with soft tissue air extending below the right ear. 3. Mild to moderate cortical volume loss and scattered small vessel ischemic microangiopathy. 4. Small chronic lacunar infarct at the left cerebellar hemisphere. Electronically Signed   By: Garald Balding M.D.   On: 10/12/2018 00:18   Dg Hand 2 View Left  Result Date: 10/12/2018 CLINICAL DATA:  Pt reports he had a fall last night and is now experiencing pain in his left hand. Pt hand appears swollen and red at this time EXAM: LEFT HAND - 2 VIEW COMPARISON:  None. FINDINGS: IV tubing overlies the wrist. There are degenerative changes insert wrist, primarily along the radial aspect of the carpals and the first carpometacarpal joint. There is degenerative change at the second metacarpophalangeal joint. No acute fracture or subluxation. Atherosclerotic calcification of the small arteries. IMPRESSION: No evidence for acute  abnormality.  Degenerative changes. Electronically Signed   By: Nolon Nations M.D.   On: 10/12/2018 15:27   Ct Abdomen W  Wo Contrast  Result Date: 09/25/2018 CLINICAL DATA:  Evaluate pancreatic calcifications EXAM: CT ABDOMEN WITHOUT AND WITH CONTRAST TECHNIQUE: Multidetector CT imaging of the abdomen was performed following the standard protocol before and following the bolus administration of intravenous contrast. CONTRAST:  138mL ISOVUE-300 IOPAMIDOL (ISOVUE-300) INJECTION 61% COMPARISON:  PET-CT 08/30/2018. FINDINGS: Lower chest: Large right lower lobe lung mass is again identified measuring 7.9 cm. Loculated pleural fluid is identified overlying the right midlung and right lower lobe. Small left pleural effusion noted. Aortic atherosclerosis is identified. Hepatobiliary: No focal liver abnormality. The gallbladder is not confidently identified and may be surgically absent. Pancreas: No pancreatic inflammation identified faint punctate areas of mild hyperattenuation within the head of pancreas are again identified within head of pancreas, image 39/2 and may represent small calcifications. 1.4 cm low-density structure within the head of pancreas is identified measuring 32 HU, image 96/14. This is favored to represent a mildly dilated common bile duct. This roughly corresponds to the areas of calcification within the head of pancreas which may reflect choledocholithiasis. Body and tail of pancreas appear normal. Spleen: Spleen normal. Adrenals/Urinary Tract: Unremarkable appearance of the adrenal glands. There is a cyst arising from inferior pole of right kidney measuring 3 cm, image 46/11. Smaller cysts noted within the upper pole of the right kidney. Multiple areas of cortical scarring involve the left kidney. Stomach/Bowel: Stomach and small bowel loops have a normal course and caliber. Extensive colonic diverticulosis noted. Vascular/Lymphatic: Aortic atherosclerosis. No aneurysm. No adenopathy identified within the abdomen. Other: Trace free fluid identified along the pericolic gutters. Musculoskeletal: Mild thoraco lumbar  scoliosis and multilevel degenerative disc disease. IMPRESSION: 1. Faint calcifications within head of pancreas are again noted. There is cystic area of low attenuation within the head of pancreas which may represent a mildly dilated common bile duct. Calcifications may represent choledocholithiasis. In a patient who cannot have an MRI due to pacer device ERCP may be helpful to confirm common bile duct stones. 2. Large right lower lobe lung mass with surrounding loculated pleural fluid 3.  Aortic Atherosclerosis (ICD10-I70.0). Electronically Signed   By: Kerby Moors M.D.   On: 09/25/2018 16:27   Dg Chest Port 1 View  Result Date: 10/12/2018 CLINICAL DATA:  Status post fall, with concern for chest injury. Initial encounter. EXAM: PORTABLE CHEST 1 VIEW COMPARISON:  CT of the chest performed 08/21/2018 FINDINGS: There is a persistent small right-sided pleural effusion. The known right lower lobe mass is better characterized on prior CT. There is suggestion of underlying interstitial edema, which may be transient in nature. No pneumothorax is seen. The cardiomediastinal silhouette is borderline normal in size. The patient is status post median sternotomy. An AICD is noted overlying the left chest wall, with a single lead ending overlying the right ventricle. The patient's right chest port is noted ending about the mid SVC. No displaced rib fractures are seen. IMPRESSION: 1. Persistent small right-sided pleural effusion. Known right lower lobe mass is better characterized on prior CT. Suggestion of underlying interstitial edema, which may be transient in nature. 2. No displaced rib fracture seen. Electronically Signed   By: Garald Balding M.D.   On: 10/12/2018 00:22    Assessment and plan- Patient is a 77 y.o. male comorbidities as mentioned about and stage III lung cancer currently undergoing concurrent chemoradiation admitted for syncope  1.  It is unclear as to what triggered the syncope if it was  postural hypotension or cardiac arrhythmia.  His AICD did not fire during this time so it is unlikely that this  would have been no ventricular arrhythmia.  Cardiology is on board and they plan to interrogate his AICD prior to discharge.  He is currently on amiodarone drip for his atrial fibrillation and we will be switched to oral amiodarone tomorrow.  2.  With regards to his stage III lung cancer he was supposed to receive chemotherapy today which will be on hold and I will see him next week as an outpatient and decide about restarting his chemotherapy when he restarts radiation.  3.  In terms of his overall prognosis: His stage III lung cancer by itself does not carry overall poor prognosis.  Five-year overall survival in stage III lung cancer could be as high as 36%.  However patient has multiple comorbidities and those when factored in along with his stage III lung cancer does carry an overall poor prognosis for him.  Patient was supposed to see Vonna Kotyk borders from palliative care as an outpatient.  We will reschedule this appointment for him next week to address further goals of care   Thank you for this kind referral and the opportunity to participate in the care of this patient   Visit Diagnosis 1. Ventricular tachycardia ([place])   2. Syncope, unspecified syncope type   3. Fall     Dr. Randa Evens, MD, MPH [facility] [phone] 10/12/2018  11:07 PM

## 2018-10-12 NOTE — Progress Notes (Addendum)
Pt refusing bed alarm at this time but was educated about safety. Willl continue to monitor.  Update 2330: Pt agreed to turn the bed alarm on. Will continue to monitor.  Update 0030: Pt Blood sugar as per tech Sophronia Simas ) and is at 115. Glucometer not sinking. Will continue to monitor.  Update 0205: Pt complaining of nasal congestion and states taking flonase at home. Notify prime. Will continue to monitor.  Update 0213: Dr. Marcille Blanco ordered lasix 40 mg oral every other day and Flonase 50 MCG?ACT nasal 2 spray each nare daily. Will continue to monitor.  Update 0234: Pt does have an allergy with prednisone and states his organ shuts down with it. Pt did inform about it and agreed not to use Flonase. Notify Dr. Marcille Blanco. Flonase was d/c and switch to sodium chloride ( OCEAN ) 0.65% nasal 1 spray each nare as needed. Will continue to monitor.  Update 0458: Pt refused to put chair alamr on but was educated about safety. Will continue to monitor.

## 2018-10-12 NOTE — Progress Notes (Addendum)
Patient with swelling, tenderness, abrasions to left hand. Patient think he fell on it or hit the dryer as he was falling. Order for xray per Dr. Darvin Neighbours.

## 2018-10-12 NOTE — Consult Note (Addendum)
[facility] Cardiology  CARDIOLOGY CONSULT NOTE  [name] MRN: [id number] DOB/AGE: 77-09-1941 77 y.o.  Admit date: 10/11/2018 Referring Physician Dr. Marcille Blanco Primary Physician Dr. Jeananne Rama Primary Cardiologist Dr. Saralyn Pilar Reason for Consultation Syncope, reported episodes of Vtach with AICD in place  HPI: Kirk Jones is a 77 year old male with a past medical history significant for aortic valve disease, s/p valve replacement in 2004, coronary artery disease s/p PCI of LAD, ischemic cardiomyopathy s/p ICD insertion in 9622, chronic systolic congestive heart failure, paroxysmal atrial fibrillation on warfarin, an ascending aortic aneurysm, chronic kidney disease, and hyperlipidemia who presented to the ED on 10/11/18 for a syncopal episode.  Kirk Jones states that he was sitting in his chair, stood up too quickly, passed out, and then hit the right side of his head.  Has recently been receiving chemotherapy for squamous cell lung carcinoma, and admits to less energy and increased fatigue over the past 2 weeks. He also has been having diarrhea and less po intake recently. He had his diuretic increased recently due to lower extremity edema. CT of the head revealed no acute intracranial process.  ED noticed episodes of multiple PVCs and ventricular tachycardia, but AICD did not fire and patient was asymptomatic during this time.   Today, Kirk Jones is lying down in bed, in no acute distress.  Denies chest pain.  Breathing is at baseline on supplemental O2.  Admits to chronic lower extremity swelling which is better than baseline.  Denies headache, vision changes, dizziness, or lightheadedness.   Echocardiogram on 04/01/17 revealed regionally impaired LV with preserved systolic function, EF estimated at 50%. Moderate to severe TR and MR. Trace AI. Normally functioning aortic valve.  Last AICD interrogation report on 07/25/18 revealed normal functioning device with no detected episodes of VT,  VF.   Past Medical History:  Diagnosis Date  . AICD (automatic cardioverter/defibrillator) present   . Aortic valve regurgitation   . Atrial fibrillation ([place])   . Barrett's esophagus   . Cancer ([place])    sate III lung  . CHF (congestive heart failure) ([place])   . COPD (chronic obstructive pulmonary disease) ([place])   . Diverticulosis   . Dysrhythmia   . GERD (gastroesophageal reflux disease)   . Gout   . Headache    aura only  . Heart murmur   . Hematuria   . Hernia of abdominal cavity   . History of prosthetic heart valve   . Hyperlipidemia   . Hypertension   . Nodular prostate with urinary obstruction   . Presence of permanent cardiac pacemaker   . Renal insufficiency   . Substance abuse ([place])    alcohol    Past Surgical History:  Procedure Laterality Date  . CARDIAC VALVE REPLACEMENT N/A 2004  . CARPAL TUNNEL RELEASE Right 2015  . CATARACT EXTRACTION Right 2017  . COLONOSCOPY WITH PROPOFOL N/A 2014  . CORONARY ANGIOPLASTY     stints x2  . EYE SURGERY     cross eyed  . FOOT SURGERY Bilateral    [place]  . FRACTURE SURGERY Right    [place]  . INGUINAL HERNIA REPAIR Right 09/16/2017   Procedure: HERNIA REPAIR INGUINAL ADULT;  Surgeon: Christene Lye, MD;  Location: [facility] ORS;  Service: General;  Laterality: Right;  . INSERT / REPLACE / [place]     ICD  . PACEMAKER IMPLANT Left 2013   and ICD  . PORTA CATH INSERTION N/A 09/21/2018   Procedure: PORTA CATH  INSERTION;  Surgeon: Algernon Huxley, MD;  Location: [facility];  Service: Cardiovascular;  Laterality: N/A;  . VASECTOMY      Medications Prior to Admission  Medication Sig Dispense Refill Last Dose  . acetaminophen (TYLENOL) 650 MG CR tablet Take 650 mg by mouth 2 (two) times daily.   prn at prn  . aspirin EC 81 MG tablet Take 81 mg by mouth daily.   10/11/2018 at Unknown time  . COLCRYS 0.6 MG tablet TAKE 1 TABLET BY MOUTH ONCE DAILY 30 tablet 1 Unknown at Unknown  . desonide (DESOWEN)  0.05 % cream Apply topically 2 (two) times daily. 60 g 0 Unknown at Unknown  . diltiazem (CARDIZEM CD) 180 MG 24 hr capsule   3 Unknown at Unknown  . empagliflozin (JARDIANCE) 10 MG TABS tablet Take 10 mg by mouth daily.   Unknown at Unknown  . finasteride (PROSCAR) 5 MG tablet TAKE 1 TABLET BY MOUTH ONCE DAILY 90 tablet 2 Unknown at Unknown  . fluticasone (FLONASE) 50 MCG/ACT nasal spray Place 2 sprays daily into both nostrils. 16 g 12 Unknown at Unknown  . furosemide (LASIX) 40 MG tablet Take 40 mg by mouth daily.    Unknown at Unknown  . ketoconazole (NIZORAL) 2 % shampoo Apply 1 application topically 2 (two) times a week. 120 mL 2 Unknown at Unknown  . losartan (COZAAR) 25 MG tablet Take 1 tablet (25 mg total) by mouth daily. 90 tablet 4 Unknown at Unknown  . Magnesium 400 MG TABS Take 400 mg by mouth daily.    Unknown at Unknown  . metoprolol succinate (TOPROL-XL) 50 MG 24 hr tablet TAKE 1 TABLET BY MOUTH TWICE DAILY WITH MEALS OR IMMEDIATELY FOLLOWING MEAL 180 tablet 4 Unknown at Unknown  . Multiple Vitamins-Minerals (CENTRUM ADULTS PO) Take 1 tablet by mouth daily.    Unknown at Unknown  . omeprazole (PRILOSEC) 20 MG capsule Take 1 capsule (20 mg total) by mouth daily. 90 capsule 4 Unknown at Unknown  . potassium chloride SA (K-DUR,KLOR-CON) 20 MEQ tablet Take 1 tablet (20 mEq total) by mouth daily. 4 tablet 0 Unknown at Unknown  . prochlorperazine (COMPAZINE) 10 MG tablet Take 1 tablet (10 mg total) by mouth every 6 (six) hours as needed (Nausea or vomiting). 30 tablet 1 prn at prn  . tamsulosin (FLOMAX) 0.4 MG CAPS capsule Take 2 capsules (0.8 mg total) by mouth daily. (Patient taking differently: Take 0.4 mg by mouth daily. ) 90 capsule 4 Unknown at Unknown  . tiotropium (SPIRIVA) 18 MCG inhalation capsule Place 1 capsule (18 mcg total) into inhaler and inhale daily. 90 capsule 4 Unknown at Unknown  . warfarin (COUMADIN) 1 MG tablet TAKE 1 TABLET BY MOUTH ONCE DAILY 30 tablet 12 Unknown at  Unknown  . warfarin (COUMADIN) 3 MG tablet TAKE 1 TABLET BY MOUTH ONCE DAILY. 30 tablet 2 Unknown at Unknown  . dexamethasone (DECADRON) 4 MG tablet Take 2 tablets (8 mg total) by mouth daily. Start the day after chemotherapy for 2 days. (Patient not taking: Reported on 10/05/2018) 30 tablet 1 Not Taking  . diltiazem (DILACOR XR) 180 MG 24 hr capsule Take 180 mg by mouth daily (Patient not taking: Reported on 10/12/2018) 90 capsule 4 Not Taking at Unknown time  . fexofenadine (ALLEGRA) 180 MG tablet Take 180 mg by mouth daily.   Taking  . lidocaine-prilocaine (EMLA) cream Apply to affected area once 30 g 3 Taking  . loratadine (CLARITIN) 10 MG tablet  Take 1 tablet (10 mg total) by mouth daily. 90 tablet 3 Taking  . LORazepam (ATIVAN) 0.5 MG tablet Take 1 tablet (0.5 mg total) by mouth every 6 (six) hours as needed (Nausea or vomiting). 30 tablet 0 Taking  . ondansetron (ZOFRAN) 8 MG tablet Take 1 tablet (8 mg total) by mouth 2 (two) times daily as needed for refractory nausea / vomiting. Start on day 3 after chemo. 30 tablet 1 Taking  . Polyethyl Glycol-Propyl Glycol (SYSTANE OP) Place 1 drop into both eyes as needed (for dry eyes).   Taking  . UNABLE TO FIND CBD Hemp topical cream   Taking  . warfarin (COUMADIN) 4 MG tablet TAKE 1 TABLET BY MOUTH ONCE DAILY AT 6 P.M. 30 tablet 3    Social History   Socioeconomic History  . Marital status: Married    Spouse name: Belenda Cruise   . Number of children: 5  . Years of education: Not on file  . Highest education level: Not on file  Occupational History  . Not on file  Social Needs  . Financial resource strain: Not hard at all  . Food insecurity:    Worry: Never true    Inability: Never true  . Transportation needs:    Medical: No    Non-medical: No  Tobacco Use  . Smoking status: Former Smoker    Types: Cigarettes    Last attempt to quit: 04/24/1991    Years since quitting: 27.4  . Smokeless tobacco: Never Used  Substance and Sexual  Activity  . Alcohol use: Yes    Alcohol/week: 2.0 standard drinks    Types: 2 Shots of liquor per week    Comment: quit December 2015/restarted-2 drinks per night  . Drug use: No  . Sexual activity: Never  Lifestyle  . Physical activity:    Days per week: 0 days    Minutes per session: 0 min  . Stress: Not at all  Relationships  . Social connections:    Talks on phone: Once a week    Gets together: Once a week    Attends religious service: More than 4 times per year    Active member of club or organization: No    Attends meetings of clubs or organizations: Never    Relationship status: Married  . Intimate partner violence:    Fear of current or ex partner: No    Emotionally abused: No    Physically abused: No    Forced sexual activity: No  Other Topics Concern  . Not on file  Social History Narrative  . Not on file    Family History  Problem Relation Age of Onset  . Heart attack Mother   . Diabetes Maternal Grandmother   . Lung cancer Sister       Review of systems complete and found to be negative unless listed above      PHYSICAL EXAM  General: Well developed, well nourished, in no acute distress HEENT:  Normocephalic and atramatic Neck:  No JVD.  Lungs: Clear bilaterally to auscultation and percussion. On supplemental O2 Heart: Irregularly irregular, rate 126.  Normal S1 and S2 without gallops or murmurs.  Abdomen: Bowel sounds are positive, abdomen soft and non-tender  Msk:  Back normal.  Normal strength and tone for age. Gait not assessed Extremities: No clubbing or cyanosis.  2+ pitting edema bilaterally Neuro: Alert and oriented X 3. Psych:  Good affect, responds appropriately  Labs:   Lab Results  Component Value  Date   WBC 5.7 10/11/2018   HGB 9.4 (L) 10/11/2018   HCT 28.0 (L) 10/11/2018   MCV 92.1 10/11/2018   PLT 195 10/11/2018    Recent Labs  Lab 10/11/18 2354  NA 129*  K 3.6  CL 88*  CO2 32  BUN 32*  CREATININE 0.98  CALCIUM 7.8*   PROT 5.6*  BILITOT 1.4*  ALKPHOS 64  ALT 15  AST 25  GLUCOSE 109*   Lab Results  Component Value Date   CKMB 110.4 (H) 11/16/2014   TROPONINI 0.08 (HH) 10/12/2018    Lab Results  Component Value Date   CHOL 166 05/08/2018   CHOL 187 10/24/2017   CHOL 189 06/14/2017   Lab Results  Component Value Date   HDL 118 10/24/2017   HDL 75 06/14/2017   HDL 116 10/11/2016   Lab Results  Component Value Date   LDLCALC 57 10/24/2017   LDLCALC 99 06/14/2017   LDLCALC 97 10/11/2016   Lab Results  Component Value Date   TRIG 75 05/08/2018   TRIG 61 10/24/2017   TRIG 77 06/14/2017   Lab Results  Component Value Date   CHOLHDL 1.6 10/24/2017   CHOLHDL 2.5 06/14/2017   CHOLHDL 2.0 10/11/2016   No results found for: LDLDIRECT    Radiology: Ct Head Wo Contrast  Result Date: 10/12/2018 CLINICAL DATA:  Acute onset of dizziness and fall. Hit head on headboard. Laceration at the right temple. Patient on Coumadin. Initial encounter. EXAM: CT HEAD WITHOUT CONTRAST TECHNIQUE: Contiguous axial images were obtained from the base of the skull through the vertex without intravenous contrast. COMPARISON:  CT of the head performed 09/04/2018 FINDINGS: Brain: No evidence of acute infarction, hemorrhage, hydrocephalus, extra-axial collection or mass lesion / mass effect. Prominence of the ventricles and sulci reflects mild to moderate cortical volume loss. Cerebellar atrophy is noted. Scattered periventricular and subcortical white matter change likely reflects small vessel ischemic microangiopathy. A small chronic lacunar infarct is noted at the left cerebellar hemisphere. The brainstem and fourth ventricle are within normal limits. The basal ganglia are unremarkable in appearance. The cerebral hemispheres demonstrate grossly normal gray-white differentiation. No mass effect or midline shift is seen. Vascular: No hyperdense vessel or unexpected calcification. Skull: There is no evidence of fracture;  visualized osseous structures are unremarkable in appearance. Sinuses/Orbits: The visualized portions of the orbits are within normal limits. The paranasal sinuses and mastoid air cells are well-aerated. Other: A prominent soft tissue laceration is noted anterior to the right ear, with soft tissue air extending below the right ear. IMPRESSION: 1. No evidence of traumatic intracranial injury or fracture. 2. Prominent soft tissue laceration anterior to the right ear, with soft tissue air extending below the right ear. 3. Mild to moderate cortical volume loss and scattered small vessel ischemic microangiopathy. 4. Small chronic lacunar infarct at the left cerebellar hemisphere. Electronically Signed   By: Garald Balding M.D.   On: 10/12/2018 00:18   Ct Abdomen W Wo Contrast  Result Date: 09/25/2018 CLINICAL DATA:  Evaluate pancreatic calcifications EXAM: CT ABDOMEN WITHOUT AND WITH CONTRAST TECHNIQUE: Multidetector CT imaging of the abdomen was performed following the standard protocol before and following the bolus administration of intravenous contrast. CONTRAST:  162mL ISOVUE-300 IOPAMIDOL (ISOVUE-300) INJECTION 61% COMPARISON:  PET-CT 08/30/2018. FINDINGS: Lower chest: Large right lower lobe lung mass is again identified measuring 7.9 cm. Loculated pleural fluid is identified overlying the right midlung and right lower lobe. Small left pleural effusion noted. Aortic atherosclerosis  is identified. Hepatobiliary: No focal liver abnormality. The gallbladder is not confidently identified and may be surgically absent. Pancreas: No pancreatic inflammation identified faint punctate areas of mild hyperattenuation within the head of pancreas are again identified within head of pancreas, image 39/2 and may represent small calcifications. 1.4 cm low-density structure within the head of pancreas is identified measuring 32 HU, image 96/14. This is favored to represent a mildly dilated common bile duct. This roughly  corresponds to the areas of calcification within the head of pancreas which may reflect choledocholithiasis. Body and tail of pancreas appear normal. Spleen: Spleen normal. Adrenals/Urinary Tract: Unremarkable appearance of the adrenal glands. There is a cyst arising from inferior pole of right kidney measuring 3 cm, image 46/11. Smaller cysts noted within the upper pole of the right kidney. Multiple areas of cortical scarring involve the left kidney. Stomach/Bowel: Stomach and small bowel loops have a normal course and caliber. Extensive colonic diverticulosis noted. Vascular/Lymphatic: Aortic atherosclerosis. No aneurysm. No adenopathy identified within the abdomen. Other: Trace free fluid identified along the pericolic gutters. Musculoskeletal: Mild thoraco lumbar scoliosis and multilevel degenerative disc disease. IMPRESSION: 1. Faint calcifications within head of pancreas are again noted. There is cystic area of low attenuation within the head of pancreas which may represent a mildly dilated common bile duct. Calcifications may represent choledocholithiasis. In a patient who cannot have an MRI due to pacer device ERCP may be helpful to confirm common bile duct stones. 2. Large right lower lobe lung mass with surrounding loculated pleural fluid 3.  Aortic Atherosclerosis (ICD10-I70.0). Electronically Signed   By: Kerby Moors M.D.   On: 09/25/2018 16:27   Dg Chest Port 1 View  Result Date: 10/12/2018 CLINICAL DATA:  Status post fall, with concern for chest injury. Initial encounter. EXAM: PORTABLE CHEST 1 VIEW COMPARISON:  CT of the chest performed 08/21/2018 FINDINGS: There is a persistent small right-sided pleural effusion. The known right lower lobe mass is better characterized on prior CT. There is suggestion of underlying interstitial edema, which may be transient in nature. No pneumothorax is seen. The cardiomediastinal silhouette is borderline normal in size. The patient is status post median  sternotomy. An AICD is noted overlying the left chest wall, with a single lead ending overlying the right ventricle. The patient's right chest port is noted ending about the mid SVC. No displaced rib fractures are seen. IMPRESSION: 1. Persistent small right-sided pleural effusion. Known right lower lobe mass is better characterized on prior CT. Suggestion of underlying interstitial edema, which may be transient in nature. 2. No displaced rib fracture seen. Electronically Signed   By: Garald Balding M.D.   On: 10/12/2018 00:22    EKG: Atrial fibrillation with RVR with frequent PVCs, RBBB.  Rate 108.    ASSESSMENT AND PLAN:  1.  Atrial fibrillation with RVR   -Continue with amiodarone drip for now, will likely convert to po in am.   -Continue diltiazem 180mg   -Continue warfarin 3mg , INR goal of 2-3 2.  Elevated troponin (.08, .08)   -Likely secondary to atrial fibrillation with RVR  -Does not appear to be due to an acute coronary event. Demand ischemia is likely.  3.  Syncope   -CT negative; Vtach reportedly noted in ED without AICD firing; will continue amiodarone drip for now  4.  Ischemic cardiomyopathy s/p AICD   -Will interrogate AICD prior to discharge.   -Continue with current home medications  5.  Hypertension   -Continue current medication regimen  The history, physical exam findings, and plan of care were all discussed with Dr. Bartholome Bill, and all decision making was made in collaboration.   Signed: Avie Arenas PA-C 10/12/2018, 9:56 AM

## 2018-10-12 NOTE — Progress Notes (Signed)
Patient/family asking about Dr.Rao coming to see patient since he was supposed to have chemo and radiation today. Spoke to Dr. Darvin Neighbours, he has already communicated with oncologist and she said she would see the patient today.

## 2018-10-12 NOTE — Progress Notes (Addendum)
Pt is requesting something for sleep. Page prime. Will continue to monitor.  Update 2148: Dr. Jannifer Franklin ordered benadryl 25-50 mg Prn for Sleep. Will continue to monitor.

## 2018-10-12 NOTE — ED Notes (Signed)
Date and time results received: 10/12/18 0027 (use smartphrase ".now" to insert current time)  Test: Troponin Critical Value: Trop: 0.08  Name of Provider Notified: Dr. Owens Shark  Orders Received? Or Actions Taken?: Acknowledged

## 2018-10-12 NOTE — Progress Notes (Signed)
CRITICAL VALUE ALERT  Critical Value:  Trop 0.08  Date & Time Notied:  10/12/18 9191  Provider Notified:   Orders Received/Actions taken:    Handoff to Day shift RN Lovena Le

## 2018-10-12 NOTE — Care Management Note (Signed)
Case Management Note  Patient Details  Name: Kirk Jones MRN: [id number] Date of Birth: Feb 26, 1941  Subjective/Objective:      Patient is from home with wife Placed in observation for syncope.    History of A-Fib, CHF and lung cancer.  Currently receiving radiation and chemotherapy.   Elevated BNP; amiodarone drip, IV lasix.  Cardiac and oncology consults placed.  He is on coumadin and has an AICD.  Chronic O2 at 2L at home.  Wife will bring portable O2 tank for discharge.  No issues accessing medical care or with obtaining medications.    Current with PCP.  [facility] in [facility].  Will notify Corene Cornea at discharge.  Has a walker and a cane at home.   RNCM made [facility] referral as he is on the registry.  No further needs identified by [facility] at this time.         Action/Plan:   Expected Discharge Date:  10/14/18               Expected Discharge Plan:  [place]  In-House Referral:     Discharge planning Services  CM Consult  Post Acute Care Choice:  Resumption of Svcs/PTA Provider Choice offered to:     DME Arranged:    DME Agency:  [place] Arranged:  RN, PT, OT, Nurse's Aide, Social Work [employer] Agency:  [place]  Status of Service:  Completed, signed off  If discussed at [employer], dates discussed:    Additional Comments:  Elza Rafter, RN 10/12/2018, 2:32 PM

## 2018-10-12 NOTE — Care Management Obs Status (Signed)
[place] NOTIFICATION   Patient Details  Name: Kirk Jones MRN: [id number] Date of Birth: 1941/10/09   Medicare Observation Status Notification Given:  Yes    Elza Rafter, RN 10/12/2018, 2:31 PM

## 2018-10-13 ENCOUNTER — Ambulatory Visit: Payer: Medicare Other

## 2018-10-13 DIAGNOSIS — L8990 Pressure ulcer of unspecified site, unspecified stage: Secondary | ICD-10-CM | POA: Diagnosis present

## 2018-10-13 DIAGNOSIS — I5023 Acute on chronic systolic (congestive) heart failure: Secondary | ICD-10-CM | POA: Diagnosis not present

## 2018-10-13 DIAGNOSIS — R55 Syncope and collapse: Secondary | ICD-10-CM | POA: Diagnosis not present

## 2018-10-13 DIAGNOSIS — N183 Chronic kidney disease, stage 3 (moderate): Secondary | ICD-10-CM | POA: Diagnosis present

## 2018-10-13 DIAGNOSIS — S4992XA Unspecified injury of left shoulder and upper arm, initial encounter: Secondary | ICD-10-CM | POA: Diagnosis not present

## 2018-10-13 DIAGNOSIS — I719 Aortic aneurysm of unspecified site, without rupture: Secondary | ICD-10-CM | POA: Diagnosis present

## 2018-10-13 DIAGNOSIS — I480 Paroxysmal atrial fibrillation: Secondary | ICD-10-CM | POA: Diagnosis present

## 2018-10-13 DIAGNOSIS — E1122 Type 2 diabetes mellitus with diabetic chronic kidney disease: Secondary | ICD-10-CM | POA: Diagnosis present

## 2018-10-13 DIAGNOSIS — I471 Supraventricular tachycardia: Secondary | ICD-10-CM | POA: Diagnosis present

## 2018-10-13 DIAGNOSIS — I248 Other forms of acute ischemic heart disease: Secondary | ICD-10-CM | POA: Diagnosis present

## 2018-10-13 DIAGNOSIS — Z515 Encounter for palliative care: Secondary | ICD-10-CM

## 2018-10-13 DIAGNOSIS — S0181XA Laceration without foreign body of other part of head, initial encounter: Secondary | ICD-10-CM | POA: Diagnosis present

## 2018-10-13 DIAGNOSIS — I083 Combined rheumatic disorders of mitral, aortic and tricuspid valves: Secondary | ICD-10-CM | POA: Diagnosis present

## 2018-10-13 DIAGNOSIS — M7989 Other specified soft tissue disorders: Secondary | ICD-10-CM | POA: Diagnosis not present

## 2018-10-13 DIAGNOSIS — E871 Hypo-osmolality and hyponatremia: Secondary | ICD-10-CM | POA: Diagnosis not present

## 2018-10-13 DIAGNOSIS — E785 Hyperlipidemia, unspecified: Secondary | ICD-10-CM | POA: Diagnosis present

## 2018-10-13 DIAGNOSIS — C3490 Malignant neoplasm of unspecified part of unspecified bronchus or lung: Secondary | ICD-10-CM | POA: Diagnosis present

## 2018-10-13 DIAGNOSIS — I255 Ischemic cardiomyopathy: Secondary | ICD-10-CM | POA: Diagnosis present

## 2018-10-13 DIAGNOSIS — E869 Volume depletion, unspecified: Secondary | ICD-10-CM | POA: Diagnosis present

## 2018-10-13 DIAGNOSIS — I5043 Acute on chronic combined systolic (congestive) and diastolic (congestive) heart failure: Secondary | ICD-10-CM | POA: Diagnosis present

## 2018-10-13 DIAGNOSIS — J449 Chronic obstructive pulmonary disease, unspecified: Secondary | ICD-10-CM | POA: Diagnosis present

## 2018-10-13 DIAGNOSIS — C3491 Malignant neoplasm of unspecified part of right bronchus or lung: Secondary | ICD-10-CM | POA: Diagnosis present

## 2018-10-13 DIAGNOSIS — Z66 Do not resuscitate: Secondary | ICD-10-CM | POA: Diagnosis not present

## 2018-10-13 DIAGNOSIS — X58XXXA Exposure to other specified factors, initial encounter: Secondary | ICD-10-CM | POA: Diagnosis present

## 2018-10-13 DIAGNOSIS — I130 Hypertensive heart and chronic kidney disease with heart failure and stage 1 through stage 4 chronic kidney disease, or unspecified chronic kidney disease: Secondary | ICD-10-CM | POA: Diagnosis present

## 2018-10-13 DIAGNOSIS — I2510 Atherosclerotic heart disease of native coronary artery without angina pectoris: Secondary | ICD-10-CM | POA: Diagnosis present

## 2018-10-13 DIAGNOSIS — I493 Ventricular premature depolarization: Secondary | ICD-10-CM | POA: Diagnosis present

## 2018-10-13 DIAGNOSIS — K2270 Barrett's esophagus without dysplasia: Secondary | ICD-10-CM | POA: Diagnosis present

## 2018-10-13 DIAGNOSIS — R7989 Other specified abnormal findings of blood chemistry: Secondary | ICD-10-CM | POA: Diagnosis not present

## 2018-10-13 LAB — GLUCOSE, CAPILLARY
Glucose-Capillary: 108 mg/dL — ABNORMAL HIGH (ref 70–99)
Glucose-Capillary: 110 mg/dL — ABNORMAL HIGH (ref 70–99)
Glucose-Capillary: 113 mg/dL — ABNORMAL HIGH (ref 70–99)
Glucose-Capillary: 115 mg/dL — ABNORMAL HIGH (ref 70–99)
Glucose-Capillary: 134 mg/dL — ABNORMAL HIGH (ref 70–99)
Glucose-Capillary: 142 mg/dL — ABNORMAL HIGH (ref 70–99)

## 2018-10-13 LAB — PROTIME-INR
INR: 1.45
Prothrombin Time: 17.5 seconds — ABNORMAL HIGH (ref 11.4–15.2)

## 2018-10-13 MED ORDER — WARFARIN SODIUM 4 MG PO TABS
4.0000 mg | ORAL_TABLET | Freq: Every day | ORAL | Status: DC
  Administered 2018-10-13 – 2018-10-14 (×2): 4 mg via ORAL
  Filled 2018-10-13 (×2): qty 1

## 2018-10-13 MED ORDER — FLUTICASONE PROPIONATE 50 MCG/ACT NA SUSP
2.0000 | Freq: Every day | NASAL | Status: DC
  Filled 2018-10-13: qty 16

## 2018-10-13 MED ORDER — FUROSEMIDE 20 MG PO TABS
20.0000 mg | ORAL_TABLET | Freq: Every day | ORAL | Status: DC
  Administered 2018-10-14: 20 mg via ORAL
  Filled 2018-10-13 (×2): qty 1

## 2018-10-13 MED ORDER — FUROSEMIDE 40 MG PO TABS
40.0000 mg | ORAL_TABLET | ORAL | Status: DC
  Filled 2018-10-13: qty 1

## 2018-10-13 MED ORDER — SALINE SPRAY 0.65 % NA SOLN
1.0000 | NASAL | Status: DC | PRN
  Administered 2018-10-13 – 2018-10-15 (×4): 1 via NASAL
  Filled 2018-10-13: qty 44

## 2018-10-13 MED ORDER — AMIODARONE HCL 200 MG PO TABS
400.0000 mg | ORAL_TABLET | Freq: Two times a day (BID) | ORAL | Status: DC
  Administered 2018-10-13 – 2018-10-15 (×4): 400 mg via ORAL
  Filled 2018-10-13 (×4): qty 2

## 2018-10-13 MED ORDER — FLUTICASONE PROPIONATE 50 MCG/ACT NA SUSP: 2.0000 | Freq: Every day | NASAL | Status: DC

## 2018-10-13 MED ORDER — WARFARIN - PHARMACIST DOSING INPATIENT
Freq: Every day | Status: DC
  Administered 2018-10-14: 18:00:00

## 2018-10-13 NOTE — Progress Notes (Signed)
Pt BP was at 103/59 HR 82. Pt have a scheduled Amiodarone 400 mg tablet at 2200. Talked to Dr. Jannifer Franklin and states to hold it at this time and just continue monitor the pt. Will monitor the pt.

## 2018-10-13 NOTE — Progress Notes (Signed)
Patient Name: Kirk Jones [facility] Date of Encounter: 10/13/2018  Hospital Problem List     Active Problems:   Syncope   Pressure injury of skin    Patient Profile     Kirk Jones is a 77 year old male with a past medical history significant for aortic valve disease, s/p valve replacement in 2004, coronary artery disease s/p PCI of LAD, ischemic cardiomyopathy s/p ICD insertion in 0539, chronic systolic congestive heart failure, paroxysmal atrial fibrillation on warfarin, an ascending aortic aneurysm, chronic kidney disease, and hyperlipidemia who presented to the ED on 10/11/18 for a syncopal episode.   Subjective   Feels somewhat better today.  Inpatient Medications    . amiodarone  400 mg Oral BID  . aspirin EC  81 mg Oral Daily  . diltiazem  180 mg Oral Daily  . docusate sodium  100 mg Oral BID  . finasteride  5 mg Oral Daily  . [START ON 10/14/2018] furosemide  20 mg Oral Daily  . insulin aspart  0-5 Units Subcutaneous QHS  . insulin aspart  0-9 Units Subcutaneous Q4H  . magnesium oxide  400 mg Oral Daily  . pantoprazole  40 mg Oral Daily  . potassium chloride SA  20 mEq Oral Daily  . tamsulosin  0.4 mg Oral Daily  . tiotropium  18 mcg Inhalation Daily  . warfarin  4 mg Oral q1800  . Warfarin - Pharmacist Dosing Inpatient   Does not apply q1800    Vital Signs    Vitals:   10/13/18 0649 10/13/18 0652 10/13/18 0803 10/13/18 1107  BP: (!) 93/57 104/79 (!) 103/59 114/74  Pulse: 87 (!) 101 (!) 116 75  Resp:   18 18  Temp:   (!) 97.5 F (36.4 C)   TempSrc:   Oral   SpO2:   100% 99%  Weight:      Height:        Intake/Output Summary (Last 24 hours) at 10/13/2018 1544 Last data filed at 10/13/2018 1353 Gross per 24 hour  Intake 1486.7 ml  Output 540 ml  Net 946.7 ml   Filed Weights   10/11/18 2348 10/12/18 0522 10/13/18 0439  Weight: 70 kg 71.5 kg 73.2 kg    Physical Exam    GEN: Well nourished, well developed, in no acute distress.  HEENT: normal.   Neck: Supple, no JVD, carotid bruits, or masses. Cardiac: RRR, no murmurs, rubs, or gallops. No clubbing, cyanosis, edema.  Radials/DP/PT 2+ and equal bilaterally.  Respiratory:  Respirations regular and unlabored, clear to auscultation bilaterally. GI: Soft, nontender, nondistended, BS + x 4. MS: no deformity or atrophy. Skin: warm and dry, no rash. Neuro:  Strength and sensation are intact. Psych: Normal affect.  Labs    CBC Recent Labs    10/11/18 2354  WBC 5.7  HGB 9.4*  HCT 28.0*  MCV 92.1  PLT 767   Basic Metabolic Panel Recent Labs    10/11/18 2354  NA 129*  K 3.6  CL 88*  CO2 32  GLUCOSE 109*  BUN 32*  CREATININE 0.98  CALCIUM 7.8*   Liver Function Tests Recent Labs    10/11/18 2354  AST 25  ALT 15  ALKPHOS 64  BILITOT 1.4*  PROT 5.6*  ALBUMIN 2.7*   No results for input(s): LIPASE, AMYLASE in the last 72 hours. Cardiac Enzymes Recent Labs    10/12/18 0620 10/12/18 1140 10/12/18 1844  TROPONINI 0.08* 0.07* 0.06*   BNP Recent Labs  10/11/18 2353  BNP 1,977.0*   D-Dimer No results for input(s): DDIMER in the last 72 hours. Hemoglobin A1C Recent Labs    10/12/18 0620  HGBA1C 6.2*   Fasting Lipid Panel No results for input(s): CHOL, HDL, LDLCALC, TRIG, CHOLHDL, LDLDIRECT in the last 72 hours. Thyroid Function Tests Recent Labs    10/12/18 0620  TSH 2.545    Telemetry    Atrial fibrillation with variable but fairly rapid ventricular response  ECG    Atrial fibrillation with posterior fascicular block and right bundle branch block.  Radiology    Ct Head Wo Contrast  Result Date: 10/12/2018 CLINICAL DATA:  Acute onset of dizziness and fall. Hit head on headboard. Laceration at the right temple. Patient on Coumadin. Initial encounter. EXAM: CT HEAD WITHOUT CONTRAST TECHNIQUE: Contiguous axial images were obtained from the base of the skull through the vertex without intravenous contrast. COMPARISON:  CT of the head  performed 09/04/2018 FINDINGS: Brain: No evidence of acute infarction, hemorrhage, hydrocephalus, extra-axial collection or mass lesion / mass effect. Prominence of the ventricles and sulci reflects mild to moderate cortical volume loss. Cerebellar atrophy is noted. Scattered periventricular and subcortical white matter change likely reflects small vessel ischemic microangiopathy. A small chronic lacunar infarct is noted at the left cerebellar hemisphere. The brainstem and fourth ventricle are within normal limits. The basal ganglia are unremarkable in appearance. The cerebral hemispheres demonstrate grossly normal gray-white differentiation. No mass effect or midline shift is seen. Vascular: No hyperdense vessel or unexpected calcification. Skull: There is no evidence of fracture; visualized osseous structures are unremarkable in appearance. Sinuses/Orbits: The visualized portions of the orbits are within normal limits. The paranasal sinuses and mastoid air cells are well-aerated. Other: A prominent soft tissue laceration is noted anterior to the right ear, with soft tissue air extending below the right ear. IMPRESSION: 1. No evidence of traumatic intracranial injury or fracture. 2. Prominent soft tissue laceration anterior to the right ear, with soft tissue air extending below the right ear. 3. Mild to moderate cortical volume loss and scattered small vessel ischemic microangiopathy. 4. Small chronic lacunar infarct at the left cerebellar hemisphere. Electronically Signed   By: Garald Balding M.D.   On: 10/12/2018 00:18   Dg Hand 2 View Left  Result Date: 10/12/2018 CLINICAL DATA:  Pt reports he had a fall last night and is now experiencing pain in his left hand. Pt hand appears swollen and red at this time EXAM: LEFT HAND - 2 VIEW COMPARISON:  None. FINDINGS: IV tubing overlies the wrist. There are degenerative changes insert wrist, primarily along the radial aspect of the carpals and the first  carpometacarpal joint. There is degenerative change at the second metacarpophalangeal joint. No acute fracture or subluxation. Atherosclerotic calcification of the small arteries. IMPRESSION: No evidence for acute  abnormality.  Degenerative changes. Electronically Signed   By: Nolon Nations M.D.   On: 10/12/2018 15:27   Ct Abdomen W Wo Contrast  Result Date: 09/25/2018 CLINICAL DATA:  Evaluate pancreatic calcifications EXAM: CT ABDOMEN WITHOUT AND WITH CONTRAST TECHNIQUE: Multidetector CT imaging of the abdomen was performed following the standard protocol before and following the bolus administration of intravenous contrast. CONTRAST:  190mL ISOVUE-300 IOPAMIDOL (ISOVUE-300) INJECTION 61% COMPARISON:  PET-CT 08/30/2018. FINDINGS: Lower chest: Large right lower lobe lung mass is again identified measuring 7.9 cm. Loculated pleural fluid is identified overlying the right midlung and right lower lobe. Small left pleural effusion noted. Aortic atherosclerosis is identified. Hepatobiliary: No focal  liver abnormality. The gallbladder is not confidently identified and may be surgically absent. Pancreas: No pancreatic inflammation identified faint punctate areas of mild hyperattenuation within the head of pancreas are again identified within head of pancreas, image 39/2 and may represent small calcifications. 1.4 cm low-density structure within the head of pancreas is identified measuring 32 HU, image 96/14. This is favored to represent a mildly dilated common bile duct. This roughly corresponds to the areas of calcification within the head of pancreas which may reflect choledocholithiasis. Body and tail of pancreas appear normal. Spleen: Spleen normal. Adrenals/Urinary Tract: Unremarkable appearance of the adrenal glands. There is a cyst arising from inferior pole of right kidney measuring 3 cm, image 46/11. Smaller cysts noted within the upper pole of the right kidney. Multiple areas of cortical scarring involve  the left kidney. Stomach/Bowel: Stomach and small bowel loops have a normal course and caliber. Extensive colonic diverticulosis noted. Vascular/Lymphatic: Aortic atherosclerosis. No aneurysm. No adenopathy identified within the abdomen. Other: Trace free fluid identified along the pericolic gutters. Musculoskeletal: Mild thoraco lumbar scoliosis and multilevel degenerative disc disease. IMPRESSION: 1. Faint calcifications within head of pancreas are again noted. There is cystic area of low attenuation within the head of pancreas which may represent a mildly dilated common bile duct. Calcifications may represent choledocholithiasis. In a patient who cannot have an MRI due to pacer device ERCP may be helpful to confirm common bile duct stones. 2. Large right lower lobe lung mass with surrounding loculated pleural fluid 3.  Aortic Atherosclerosis (ICD10-I70.0). Electronically Signed   By: Kerby Moors M.D.   On: 09/25/2018 16:27   Dg Chest Port 1 View  Result Date: 10/12/2018 CLINICAL DATA:  Status post fall, with concern for chest injury. Initial encounter. EXAM: PORTABLE CHEST 1 VIEW COMPARISON:  CT of the chest performed 08/21/2018 FINDINGS: There is a persistent small right-sided pleural effusion. The known right lower lobe mass is better characterized on prior CT. There is suggestion of underlying interstitial edema, which may be transient in nature. No pneumothorax is seen. The cardiomediastinal silhouette is borderline normal in size. The patient is status post median sternotomy. An AICD is noted overlying the left chest wall, with a single lead ending overlying the right ventricle. The patient's right chest port is noted ending about the mid SVC. No displaced rib fractures are seen. IMPRESSION: 1. Persistent small right-sided pleural effusion. Known right lower lobe mass is better characterized on prior CT. Suggestion of underlying interstitial edema, which may be transient in nature. 2. No displaced  rib fracture seen. Electronically Signed   By: Garald Balding M.D.   On: 10/12/2018 00:22    Assessment & Plan    Kirk Jones is a 77 year old male with a past medical history significant for aortic valve disease, s/p valve replacement in 2004, coronary artery disease s/p PCI of LAD, ischemic cardiomyopathy s/p ICD insertion in 3762, chronic systolic congestive heart failure, paroxysmal atrial fibrillation on warfarin, an ascending aortic aneurysm, chronic kidney disease, and hyperlipidemia who presented to the ED on 10/11/18 for a syncopal episode.  Etiology is unclear.  Patient has had diarrhea and decreased p.o. intake around the time of the event.  Interrogation of his defibrillator revealed intermittent nonsustained runs of wide-complex tachycardia along with intermittent episodes of nonsustained narrow complex tachycardia.  Patient is having episodes of narrow complex tachycardia around the time of his event.  Most likely the syncope was secondary to relative volume depletion in the face of A.  fib with rapid ventricular response.  He is also had some nonsustained runs of VT on his defibrillator.  This did not require anti-tachycardic pacing or ICD firing due to the short nature of the events.  Would agree with continuing to load with amiodarone.  Will convert to p.o. amiodarone at 4 mg twice daily.  Continue with Cardizem.  Would continue with warfarin for anticoagulation with an INR goal between 2 and 3.  Would follow on current regimen ambulate and consider discharge when stable.  Signed, Javier Docker Lakasha Mcfall MD 10/13/2018, 3:44 PM  Pager: [phone]

## 2018-10-13 NOTE — Progress Notes (Signed)
ANTICOAGULATION CONSULT NOTE - Initial Consult  Pharmacy Consult for warfarin dosing Indication: mechanical valve  Allergies  Allergen Reactions  . Prednisone Other (See Comments)    Reaction: "organs shutting down"    Patient Measurements: Height: 5\' 7"  (170.2 cm) Weight: 161 lb 6.4 oz (73.2 kg) IBW/kg (Calculated) : 66.1 Heparin Dosing Weight: n/a  Vital Signs: Temp: 98.4 F (36.9 C) (11/15 0439) Temp Source: Oral (11/15 0439) BP: 104/79 (11/15 0652) Pulse Rate: 101 (11/15 0652)  Labs: Recent Labs    10/11/18 2354 10/12/18 0620 10/12/18 1140 10/12/18 1844 10/13/18 0338  HGB 9.4*  --   --   --   --   HCT 28.0*  --   --   --   --   PLT 195  --   --   --   --   LABPROT 17.1*  --   --   --  17.5*  INR 1.41  --   --   --  1.45  CREATININE 0.98  --   --   --   --   TROPONINI 0.08* 0.08* 0.07* 0.06*  --     Estimated Creatinine Clearance: 59 mL/min (by C-G formula based on SCr of 0.98 mg/dL).   Medical History: Past Medical History:  Diagnosis Date  . AICD (automatic cardioverter/defibrillator) present   . Aortic valve regurgitation   . Atrial fibrillation ([place])   . Barrett's esophagus   . Cancer ([place])    sate III lung  . CHF (congestive heart failure) ([place])   . COPD (chronic obstructive pulmonary disease) ([place])   . Diverticulosis   . Dysrhythmia   . GERD (gastroesophageal reflux disease)   . Gout   . Headache    aura only  . Heart murmur   . Hematuria   . Hernia of abdominal cavity   . History of prosthetic heart valve   . Hyperlipidemia   . Hypertension   . Nodular prostate with urinary obstruction   . Presence of permanent cardiac pacemaker   . Renal insufficiency   . Substance abuse ([place])    alcohol    Medications:  Patient home dose of Warfarin is 3mg   Assessment: Patient still subtherapeutic  Goal of Therapy:  INR 2.5-3.5   Plan:  Increasing warfarin to 4 mg daily due to subtherapeutic level. Daily INR checks.  Lu Duffel, PharmD Clinical Pharmacist 10/13/2018 8:04 AM

## 2018-10-13 NOTE — Progress Notes (Signed)
[place] [facility] [place]    MR#:  [id number]  DATE OF BIRTH:  1941-11-29  SUBJECTIVE:  CHIEF COMPLAINT:  No chief complaint on file.  Feels weak. No shortness of breath or chest pain.  REVIEW OF SYSTEMS:    Review of Systems  Constitutional: Positive for malaise/fatigue. Negative for chills and fever.  HENT: Negative for sore throat.   Eyes: Negative for blurred vision, double vision and pain.  Respiratory: Negative for cough, hemoptysis, shortness of breath and wheezing.   Cardiovascular: Negative for chest pain, palpitations, orthopnea and leg swelling.  Gastrointestinal: Negative for abdominal pain, constipation, diarrhea, heartburn, nausea and vomiting.  Genitourinary: Negative for dysuria and hematuria.  Musculoskeletal: Negative for back pain and joint pain.  Skin: Negative for rash.  Neurological: Negative for sensory change, speech change, focal weakness and headaches.  Endo/Heme/Allergies: Does not bruise/bleed easily.  Psychiatric/Behavioral: Negative for depression. The patient is not nervous/anxious.     DRUG ALLERGIES:   Allergies  Allergen Reactions  . Prednisone Other (See Comments)    Reaction: "organs shutting down"    VITALS:  Blood pressure 114/74, pulse 75, temperature (!) 97.5 F (36.4 C), temperature source Oral, resp. rate 18, height 5\' 7"  (1.702 m), weight 73.2 kg, SpO2 99 %.  PHYSICAL EXAMINATION:   Physical Exam  GENERAL:  77 y.o.-year-old patient lying in the bed with no acute distress.  EYES: Pupils equal, round, reactive to light and accommodation. No scleral icterus. Extraocular muscles intact.  HEENT: Head atraumatic, normocephalic. Oropharynx and nasopharynx clear.  NECK:  Supple, no jugular venous distention. No thyroid enlargement, no tenderness.  LUNGS: Normal breath sounds bilaterally, no wheezing, rales, rhonchi. No use of accessory muscles of respiration.  CARDIOVASCULAR:  S1, S2 irregular ABDOMEN: Soft, nontender, nondistended. Bowel sounds present. No organomegaly or mass.  EXTREMITIES: No cyanosis, clubbing or edema b/l.    NEUROLOGIC: Cranial nerves II through XII are intact. No focal Motor or sensory deficits b/l.   PSYCHIATRIC: The patient is alert and oriented x 3.  SKIN: No obvious rash, lesion, or ulcer.   LABORATORY PANEL:   CBC Recent Labs  Lab 10/11/18 2354  WBC 5.7  HGB 9.4*  HCT 28.0*  PLT 195   ------------------------------------------------------------------------------------------------------------------ Chemistries  Recent Labs  Lab 10/11/18 2354  NA 129*  K 3.6  CL 88*  CO2 32  GLUCOSE 109*  BUN 32*  CREATININE 0.98  CALCIUM 7.8*  AST 25  ALT 15  ALKPHOS 64  BILITOT 1.4*   ------------------------------------------------------------------------------------------------------------------  Cardiac Enzymes Recent Labs  Lab 10/12/18 1844  TROPONINI 0.06*   ------------------------------------------------------------------------------------------------------------------  RADIOLOGY:  Ct Head Wo Contrast  Result Date: 10/12/2018 CLINICAL DATA:  Acute onset of dizziness and fall. Hit head on headboard. Laceration at the right temple. Patient on Coumadin. Initial encounter. EXAM: CT HEAD WITHOUT CONTRAST TECHNIQUE: Contiguous axial images were obtained from the base of the skull through the vertex without intravenous contrast. COMPARISON:  CT of the head performed 09/04/2018 FINDINGS: Brain: No evidence of acute infarction, hemorrhage, hydrocephalus, extra-axial collection or mass lesion / mass effect. Prominence of the ventricles and sulci reflects mild to moderate cortical volume loss. Cerebellar atrophy is noted. Scattered periventricular and subcortical white matter change likely reflects small vessel ischemic microangiopathy. A small chronic lacunar infarct is noted at the left cerebellar hemisphere. The brainstem and  fourth ventricle are within normal limits. The basal ganglia are unremarkable in appearance. The cerebral hemispheres demonstrate grossly  normal gray-white differentiation. No mass effect or midline shift is seen. Vascular: No hyperdense vessel or unexpected calcification. Skull: There is no evidence of fracture; visualized osseous structures are unremarkable in appearance. Sinuses/Orbits: The visualized portions of the orbits are within normal limits. The paranasal sinuses and mastoid air cells are well-aerated. Other: A prominent soft tissue laceration is noted anterior to the right ear, with soft tissue air extending below the right ear. IMPRESSION: 1. No evidence of traumatic intracranial injury or fracture. 2. Prominent soft tissue laceration anterior to the right ear, with soft tissue air extending below the right ear. 3. Mild to moderate cortical volume loss and scattered small vessel ischemic microangiopathy. 4. Small chronic lacunar infarct at the left cerebellar hemisphere. Electronically Signed   By: Garald Balding M.D.   On: 10/12/2018 00:18   Dg Hand 2 View Left  Result Date: 10/12/2018 CLINICAL DATA:  Pt reports he had a fall last night and is now experiencing pain in his left hand. Pt hand appears swollen and red at this time EXAM: LEFT HAND - 2 VIEW COMPARISON:  None. FINDINGS: IV tubing overlies the wrist. There are degenerative changes insert wrist, primarily along the radial aspect of the carpals and the first carpometacarpal joint. There is degenerative change at the second metacarpophalangeal joint. No acute fracture or subluxation. Atherosclerotic calcification of the small arteries. IMPRESSION: No evidence for acute  abnormality.  Degenerative changes. Electronically Signed   By: Nolon Nations M.D.   On: 10/12/2018 15:27   Dg Chest Port 1 View  Result Date: 10/12/2018 CLINICAL DATA:  Status post fall, with concern for chest injury. Initial encounter. EXAM: PORTABLE CHEST 1 VIEW  COMPARISON:  CT of the chest performed 08/21/2018 FINDINGS: There is a persistent small right-sided pleural effusion. The known right lower lobe mass is better characterized on prior CT. There is suggestion of underlying interstitial edema, which may be transient in nature. No pneumothorax is seen. The cardiomediastinal silhouette is borderline normal in size. The patient is status post median sternotomy. An AICD is noted overlying the left chest wall, with a single lead ending overlying the right ventricle. The patient's right chest port is noted ending about the mid SVC. No displaced rib fractures are seen. IMPRESSION: 1. Persistent small right-sided pleural effusion. Known right lower lobe mass is better characterized on prior CT. Suggestion of underlying interstitial edema, which may be transient in nature. 2. No displaced rib fracture seen. Electronically Signed   By: Garald Balding M.D.   On: 10/12/2018 00:22     ASSESSMENT AND PLAN:   This is a 77 year old male admitted for syncope  * Syncope due to SVT on pacemaker interrogation Also has history of ventricular tachycardia. On oral Cardizem and amiodarone drip.  Change to oral amiodarone today.  Discussed with Dr. Ubaldo Glassing of cardiology.    *Chronic combined diastolic and systolic CHF.  No significant fluid overload.  *  Hyponatremia: Chronic; secondary to lung cancer  *  Lung cancer stage 3: Status post cycle 2 of carbo/Taxol  *  Diabetes mellitus type 2: Hold oral hypoglycemic agents.  Check hemoglobin A1c.  Sliding scale insulin while hospitalized  *  Paroxysmal Atrial fibrillation:Continue diltiazem for now.  Continue warfarin (status post aortic valve replacement)  *  Hypertension: Controlled .  All the records are reviewed and case discussed with Care Management/Social Worker Management plans discussed with the patient, family and they are in agreement.  CODE STATUS: FULL CODE  DVT Prophylaxis: SCDs  TOTAL TIME TAKING CARE OF  THIS PATIENT: 35 minutes.   POSSIBLE D/C IN 1-2 DAYS, DEPENDING ON CLINICAL CONDITION.  Leia Alf Bernece Gall M.D on 10/13/2018 at 1:21 PM  Between 7am to 6pm - Pager - [phone]  After 6pm go to [email or web address] - password [email or web address] [facility]  Office  [phone]  CC: Primary care physician; Guadalupe Maple, MD  Note: This dictation was prepared with Dragon dictation along with smaller phrase technology. Any transcriptional errors that result from this process are unintentional.

## 2018-10-13 NOTE — Progress Notes (Signed)
Pts HR sustaining in the 120's-130's, Pt asymptotic. Dr. Darvin Neighbours notified per MD Okay to give Diltiazem, BP 103/59, HR 124.

## 2018-10-13 NOTE — Consult Note (Signed)
[place]  [phone] [phone]   Name: [facility] Date: 10/13/2018 MRN: [id number]  DOB: [birth year]  Patient Care Team: Guadalupe Maple, MD as PCP - General (Family Medicine) Murlean Iba, MD (Internal Medicine) Isaias Cowman, MD as Consulting Physician (Cardiology) Christene Lye, MD (General Surgery) Telford Nab, RN as Registered Nurse Knox Royalty, RN as [place]: Palliative Care consult requested for this 77 y.o. male with multiple medical problems including undergoing concurrent chemoradiation treated with carbotaxol, severe aortic valve disease status post valve replacement 2004, CAD status post PCI, ischemic cardiomyopathy status post AICD insertion in 2012 with history of CHF.  PAF on warfarin, history of AAA, and CKD.  Patient was hospitalized on 09/11/2018 with syncope.  Work-up so far has been unrevealing for etiology.  Patient has been found to have A. fib with RVR now been treated on amiodarone.  Patient was scheduled to see palliative care in the clinic but was hospitalized prior to that visit.  Palliative care is now been asked to help address goals.   SOCIAL HISTORY:    Patient is married lives at home with his wife.  He has 2 adult daughters from a previous marriage.  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  Patient would also want daughters to be involved in decision-making.  Patient has a living will.  CODE STATUS: Full code  PAST MEDICAL HISTORY: Past Medical History:  Diagnosis Date  . AICD (automatic cardioverter/defibrillator) present   . Aortic valve regurgitation   . Atrial fibrillation ([place])   . Barrett's esophagus   . Cancer ([place])    sate III  lung  . CHF (congestive heart failure) ([place])   . COPD (chronic obstructive pulmonary disease) ([place])   . Diverticulosis   . Dysrhythmia   . GERD (gastroesophageal reflux disease)   . Gout   . Headache    aura only  . Heart murmur   . Hematuria   . Hernia of abdominal cavity   . History of prosthetic heart valve   . Hyperlipidemia   . Hypertension   . Nodular prostate with urinary obstruction   . Presence of permanent cardiac pacemaker   . Renal insufficiency   . Substance abuse ([place])    alcohol    PAST SURGICAL HISTORY:  Past Surgical History:  Procedure Laterality Date  . CARDIAC VALVE REPLACEMENT N/A 2004  . CARPAL TUNNEL RELEASE Right 2015  . CATARACT EXTRACTION Right 2017  . COLONOSCOPY WITH PROPOFOL N/A 2014  . CORONARY ANGIOPLASTY     stints x2  . EYE SURGERY     cross eyed  . FOOT SURGERY Bilateral    [place]  . FRACTURE SURGERY Right    f[place]  . INGUINAL HERNIA REPAIR Right 09/16/2017   Procedure: HERNIA REPAIR INGUINAL ADULT;  Surgeon: Christene Lye, MD;  Location: [facility] ORS;  Service: General;  Laterality: Right;  . INSERT / REPLACE / [place]     ICD  . PACEMAKER IMPLANT Left 2013   and ICD  . PORTA CATH INSERTION N/A 09/21/2018   Procedure: PORTA CATH INSERTION;  Surgeon: Algernon Huxley, MD;  Location: [place] [facility];  Service: Cardiovascular;  Laterality: N/A;  . VASECTOMY  HEMATOLOGY/ONCOLOGY HISTORY:    Squamous cell carcinoma of right lung ([place])   09/14/2018 Cancer Staging    Staging form: Lung, AJCC 8th Edition - Clinical stage from 09/14/2018: Stage IIIA (cT3, cN1, cM0) - Signed by Sindy Guadeloupe, MD on 09/16/2018    09/16/2018 Initial Diagnosis    Squamous cell carcinoma of right lung ([place])    09/27/2018 -  Chemotherapy    The patient had palonosetron (ALOXI) injection 0.25 mg, 0.25 mg, Intravenous,  Once, 2 of 4 cycles Administration: 0.25 mg (09/28/2018), 0.25 mg (10/05/2018) CARBOplatin (PARAPLATIN) 180 mg in  sodium chloride 0.9 % 250 mL chemo infusion, 180 mg (100 % of original dose 182.6 mg), Intravenous,  Once, 2 of 4 cycles Dose modification:   (original dose 182.6 mg, Cycle 1) Administration: 180 mg (09/28/2018), 160 mg (10/05/2018) PACLitaxel (TAXOL) 84 mg in sodium chloride 0.9 % 250 mL chemo infusion (</= 23m/m2), 45 mg/m2 = 84 mg, Intravenous,  Once, 2 of 4 cycles Administration: 84 mg (09/28/2018), 84 mg (10/05/2018)  for chemotherapy treatment.      ALLERGIES:  is allergic to prednisone.  MEDICATIONS:  Current Facility-Administered Medications  Medication Dose Route Frequency Provider Last Rate Last Dose  . acetaminophen (TYLENOL) tablet 650 mg  650 mg Oral Q6H PRN DHarrie Foreman MD       Or  . acetaminophen (TYLENOL) suppository 650 mg  650 mg Rectal Q6H PRN DHarrie Foreman MD      . amiodarone (PACERONE) tablet 400 mg  400 mg Oral BID FTeodoro Spray MD   400 mg at 10/13/18 1108  . artificial tears (LACRILUBE) ophthalmic ointment   Both Eyes PRN Sudini, SAlveta Heimlich MD      . aspirin EC tablet 81 mg  81 mg Oral Daily DHarrie Foreman MD   81 mg at 10/13/18 0940  . diltiazem (CARDIZEM CD) 24 hr capsule 180 mg  180 mg Oral Daily DHarrie Foreman MD   180 mg at 10/13/18 1108  . diphenhydrAMINE (BENADRYL) capsule 25-50 mg  25-50 mg Oral QHS PRN WLance Coon MD   25 mg at 10/12/18 2233  . docusate sodium (COLACE) capsule 100 mg  100 mg Oral BID DHarrie Foreman MD   100 mg at 10/13/18 0940  . finasteride (PROSCAR) tablet 5 mg  5 mg Oral Daily SHillary Bow MD   5 mg at 10/13/18 0940  . [START ON 10/14/2018] furosemide (LASIX) tablet 20 mg  20 mg Oral Daily FTeodoro Spray MD      . insulin aspart (novoLOG) injection 0-5 Units  0-5 Units Subcutaneous QHS DHarrie Foreman MD      . insulin aspart (novoLOG) injection 0-9 Units  0-9 Units Subcutaneous Q4H DHarrie Foreman MD   1 Units at 10/13/18 1222  . LORazepam (ATIVAN) tablet 0.5 mg  0.5 mg Oral Q6H PRN Sudini,  SAlveta Heimlich MD      . magnesium oxide (MAG-OX) tablet 400 mg  400 mg Oral Daily SHillary Bow MD   400 mg at 10/13/18 0941  . ondansetron (ZOFRAN) tablet 4 mg  4 mg Oral Q6H PRN DHarrie Foreman MD       Or  . ondansetron ([facility] injection 4 mg  4 mg Intravenous Q6H PRN DHarrie Foreman MD      . pantoprazole (PROTONIX) EC tablet 40 mg  40 mg Oral Daily SHillary Bow MD   40 mg at 10/13/18 0818  . potassium chloride SA (K-DUR,KLOR-CON) CR tablet  20 mEq  20 mEq Oral Daily Hillary Bow, MD   20 mEq at 10/13/18 0940  . prochlorperazine (COMPAZINE) injection 10 mg  10 mg Intravenous Q6H PRN Harrie Foreman, MD      . sodium chloride (OCEAN) 0.65 % nasal spray 1 spray  1 spray Each Nare PRN Harrie Foreman, MD   1 spray at 10/13/18 0443  . tamsulosin (FLOMAX) capsule 0.4 mg  0.4 mg Oral Daily Hillary Bow, MD   0.4 mg at 10/13/18 0941  . tiotropium (SPIRIVA) inhalation capsule ([facility] use ONLY) 18 mcg  18 mcg Inhalation Daily Harrie Foreman, MD   18 mcg at 10/13/18 [phone]  . warfarin (COUMADIN) tablet 4 mg  4 mg Oral q1800 Lu Duffel, [facility]      . Warfarin - Pharmacist Dosing Inpatient   Does not apply q1800 Shanlever, Pierce Crane, [facility]       Facility-Administered Medications Ordered in Other Encounters  Medication Dose Route Frequency Provider Last Rate Last Dose  . 0.9 %  sodium chloride infusion   Intravenous Continuous Bruning, Lennette Bihari, PA-C        VITAL SIGNS: BP 114/74 (BP Location: Right Arm)   Pulse 75   Temp (!) 97.5 F (36.4 C) (Oral)   Resp 18   Ht _0  (1.702 m)   Wt 161 lb 6.4 oz (73.2 kg)   SpO2 99%   BMI 25.28 kg/m  Filed Weights   10/11/18 2348 10/12/18 0522 10/13/18 0439  Weight: 154 lb 5.2 oz (70 kg) 157 lb 9.6 oz (71.5 kg) 161 lb 6.4 oz (73.2 kg)    Estimated body mass index is 25.28 kg/m as calculated from the following:   Height as of this encounter: _1  (1.702 m).   Weight as of this encounter: 161 lb 6.4 oz (73.2 kg).  LABS: CBC:      Component Value Date/Time   WBC 5.7 10/11/2018 2354   HGB 9.4 (L) 10/11/2018 2354   HGB 13.6 11/10/2017 1328   HCT 28.0 (L) 10/11/2018 2354   HCT 40.8 11/10/2017 1328   PLT 195 10/11/2018 2354   PLT 255 11/10/2017 1328   MCV 92.1 10/11/2018 2354   MCV 103 (H) 11/10/2017 1328   MCV 106 (H) 11/20/2014 0549   NEUTROABS 8.7 (H) 10/05/2018 0839   NEUTROABS 5.1 11/10/2017 1328   NEUTROABS 4.7 11/20/2014 0549   LYMPHSABS 0.2 (L) 10/05/2018 0839   LYMPHSABS 0.8 11/10/2017 1328   LYMPHSABS 1.0 11/20/2014 0549   MONOABS 0.4 10/05/2018 0839   MONOABS 1.0 11/20/2014 0549   EOSABS 0.0 10/05/2018 0839   EOSABS 0.1 11/10/2017 1328   EOSABS 0.2 11/20/2014 0549   BASOSABS 0.0 10/05/2018 0839   BASOSABS 0.1 11/10/2017 1328   BASOSABS 0.1 11/20/2014 0549   Comprehensive Metabolic Panel:    Component Value Date/Time   NA 129 (L) 10/11/2018 2354   NA 136 05/08/2018 1507   NA 140 11/18/2014 0514   K 3.6 10/11/2018 2354   K 4.2 11/18/2014 0514   CL 88 (L) 10/11/2018 2354   CL 106 11/18/2014 0514   CO2 32 10/11/2018 2354   CO2 27 11/18/2014 0514   BUN 32 (H) 10/11/2018 2354   BUN 21 05/08/2018 1507   BUN 17 11/18/2014 0514   CREATININE 0.98 10/11/2018 2354   CREATININE 1.35 (H) 11/18/2014 0514   GLUCOSE 109 (H) 10/11/2018 2354   GLUCOSE 93 11/18/2014 0514   CALCIUM 7.8 (L) 10/11/2018 2354   CALCIUM 8.5  11/18/2014 0514   AST 25 10/11/2018 2354   AST 40 (H) 05/08/2018 1122   AST 68 (H) 11/16/2014 0031   ALT 15 10/11/2018 2354   ALT 23 05/08/2018 1122   ALT 102 (H) 11/16/2014 0031   ALKPHOS 64 10/11/2018 2354   ALKPHOS 614 (H) 11/16/2014 0031   BILITOT 1.4 (H) 10/11/2018 2354   BILITOT 0.5 10/24/2017 0910   BILITOT 0.6 11/16/2014 0031   PROT 5.6 (L) 10/11/2018 2354   PROT 6.7 10/24/2017 0910   PROT 7.2 11/16/2014 0031   ALBUMIN 2.7 (L) 10/11/2018 2354   ALBUMIN 4.4 10/24/2017 0910   ALBUMIN 3.1 (L) 11/16/2014 0031    RADIOGRAPHIC STUDIES: Ct Head Wo Contrast  Result Date:  10/12/2018 CLINICAL DATA:  Acute onset of dizziness and fall. Hit head on headboard. Laceration at the right temple. Patient on Coumadin. Initial encounter. EXAM: CT HEAD WITHOUT CONTRAST TECHNIQUE: Contiguous axial images were obtained from the base of the skull through the vertex without intravenous contrast. COMPARISON:  CT of the head performed 09/04/2018 FINDINGS: Brain: No evidence of acute infarction, hemorrhage, hydrocephalus, extra-axial collection or mass lesion / mass effect. Prominence of the ventricles and sulci reflects mild to moderate cortical volume loss. Cerebellar atrophy is noted. Scattered periventricular and subcortical white matter change likely reflects small vessel ischemic microangiopathy. A small chronic lacunar infarct is noted at the left cerebellar hemisphere. The brainstem and fourth ventricle are within normal limits. The basal ganglia are unremarkable in appearance. The cerebral hemispheres demonstrate grossly normal gray-white differentiation. No mass effect or midline shift is seen. Vascular: No hyperdense vessel or unexpected calcification. Skull: There is no evidence of fracture; visualized osseous structures are unremarkable in appearance. Sinuses/Orbits: The visualized portions of the orbits are within normal limits. The paranasal sinuses and mastoid air cells are well-aerated. Other: A prominent soft tissue laceration is noted anterior to the right ear, with soft tissue air extending below the right ear. IMPRESSION: 1. No evidence of traumatic intracranial injury or fracture. 2. Prominent soft tissue laceration anterior to the right ear, with soft tissue air extending below the right ear. 3. Mild to moderate cortical volume loss and scattered small vessel ischemic microangiopathy. 4. Small chronic lacunar infarct at the left cerebellar hemisphere. Electronically Signed   By: Garald Balding M.D.   On: 10/12/2018 00:18   Dg Hand 2 View Left  Result Date:  10/12/2018 CLINICAL DATA:  Pt reports he had a fall last night and is now experiencing pain in his left hand. Pt hand appears swollen and red at this time EXAM: LEFT HAND - 2 VIEW COMPARISON:  None. FINDINGS: IV tubing overlies the wrist. There are degenerative changes insert wrist, primarily along the radial aspect of the carpals and the first carpometacarpal joint. There is degenerative change at the second metacarpophalangeal joint. No acute fracture or subluxation. Atherosclerotic calcification of the small arteries. IMPRESSION: No evidence for acute  abnormality.  Degenerative changes. Electronically Signed   By: Nolon Nations M.D.   On: 10/12/2018 15:27   Ct Abdomen W Wo Contrast  Result Date: 09/25/2018 CLINICAL DATA:  Evaluate pancreatic calcifications EXAM: CT ABDOMEN WITHOUT AND WITH CONTRAST TECHNIQUE: Multidetector CT imaging of the abdomen was performed following the standard protocol before and following the bolus administration of intravenous contrast. CONTRAST:  123m ISOVUE-300 IOPAMIDOL (ISOVUE-300) INJECTION 61% COMPARISON:  PET-CT 08/30/2018. FINDINGS: Lower chest: Large right lower lobe lung mass is again identified measuring 7.9 cm. Loculated pleural fluid is identified overlying the right  midlung and right lower lobe. Small left pleural effusion noted. Aortic atherosclerosis is identified. Hepatobiliary: No focal liver abnormality. The gallbladder is not confidently identified and may be surgically absent. Pancreas: No pancreatic inflammation identified faint punctate areas of mild hyperattenuation within the head of pancreas are again identified within head of pancreas, image 39/2 and may represent small calcifications. 1.4 cm low-density structure within the head of pancreas is identified measuring 32 HU, image 96/14. This is favored to represent a mildly dilated common bile duct. This roughly corresponds to the areas of calcification within the head of pancreas which may reflect  choledocholithiasis. Body and tail of pancreas appear normal. Spleen: Spleen normal. Adrenals/Urinary Tract: Unremarkable appearance of the adrenal glands. There is a cyst arising from inferior pole of right kidney measuring 3 cm, image 46/11. Smaller cysts noted within the upper pole of the right kidney. Multiple areas of cortical scarring involve the left kidney. Stomach/Bowel: Stomach and small bowel loops have a normal course and caliber. Extensive colonic diverticulosis noted. Vascular/Lymphatic: Aortic atherosclerosis. No aneurysm. No adenopathy identified within the abdomen. Other: Trace free fluid identified along the pericolic gutters. Musculoskeletal: Mild thoraco lumbar scoliosis and multilevel degenerative disc disease. IMPRESSION: 1. Faint calcifications within head of pancreas are again noted. There is cystic area of low attenuation within the head of pancreas which may represent a mildly dilated common bile duct. Calcifications may represent choledocholithiasis. In a patient who cannot have an MRI due to pacer device ERCP may be helpful to confirm common bile duct stones. 2. Large right lower lobe lung mass with surrounding loculated pleural fluid 3.  Aortic Atherosclerosis (ICD10-I70.0). Electronically Signed   By: Kerby Moors M.D.   On: 09/25/2018 16:27   Dg Chest Port 1 View  Result Date: 10/12/2018 CLINICAL DATA:  Status post fall, with concern for chest injury. Initial encounter. EXAM: PORTABLE CHEST 1 VIEW COMPARISON:  CT of the chest performed 08/21/2018 FINDINGS: There is a persistent small right-sided pleural effusion. The known right lower lobe mass is better characterized on prior CT. There is suggestion of underlying interstitial edema, which may be transient in nature. No pneumothorax is seen. The cardiomediastinal silhouette is borderline normal in size. The patient is status post median sternotomy. An AICD is noted overlying the left chest wall, with a single lead ending  overlying the right ventricle. The patient's right chest port is noted ending about the mid SVC. No displaced rib fractures are seen. IMPRESSION: 1. Persistent small right-sided pleural effusion. Known right lower lobe mass is better characterized on prior CT. Suggestion of underlying interstitial edema, which may be transient in nature. 2. No displaced rib fracture seen. Electronically Signed   By: Garald Balding M.D.   On: 10/12/2018 00:22    PERFORMANCE STATUS (ECOG) : 3 - Symptomatic, >50% confined to bed  Review of Systems As noted above. Otherwise, a complete review of systems is negative.  Physical Exam General: NAD, frail appearing Cardiovascular: Irregularly irregular Pulmonary: clear ant fields, labored with exertion Abdomen: soft, nontender, + bowel sounds GU: no suprapubic tenderness Extremities: bilat LE edema, TED hose Skin: no rashes Neurological: Weakness but otherwise nonfocal  IMPRESSION: I met with patient and his wife.  We reviewed his clinical course over the past few months.  Patient describes a recent functional decline over the past few months.  He has become increasingly short of breath with exertion and easily fatigued.  Patient sleeps in a recliner and spends most of his day on the couch.  Wife does have to assist him with some activities of daily living such as bathing and dressing.  Patient had been receiving home health services but this was scheduled to be discontinued this upcoming week.   Both patient and wife clearly describes a goal of continuing medical treatment.  There is discussion about resuming chemotherapy and radiation.  Patient will follow-up with Dr. Janese Banks outpatient.  I will also plan to see him in clinic to continue discussions regarding goals.  Patient seems committed to the idea of returning home upon discharge.  He would benefit from home health.  Recommend inpatient PT consult and evaluation.  Patient has some dyspnea on exertion.  Patient  describes this as anxiety producing.  Consideration could be given for low-dose morphine, which might help ease his sense of breathlessness.  I discussed ACP.  Patient has a living will but does not recall what exactly is outlined in that document.  He says he would want his wife and 2 daughters to be involved in decision-making if necessary.  Patient says he thinks he would want an attempt made at resuscitation but expresses a desire to avoid futile care.  He also states that he would not want his life prolonged long time on machines.    PLAN: Continue medical treatment Recommend inpatient PT evaluation Consider home health versus rehab Consideration of low-dose morphine for pain/dyspnea We will plan to follow outpatient in our palliative care clinic   Patient expressed understanding and was in agreement with this plan. He also understands that He can call clinic at any time with any questions, concerns, or complaints.     Time Total: 60 minutes  Visit consisted of counseling and education dealing with the complex and emotionally intense issues of symptom management and palliative care in the setting of serious and potentially life-threatening illness.Greater than 50%  of this time was spent counseling and coordinating care related to the above assessment and plan.  Signed by: Altha Harm, PhD, DNP, NP-C, [facility] [phone] (Work Cell)

## 2018-10-13 NOTE — Plan of Care (Signed)
  Problem: Health Behavior/Discharge Planning: Goal: Ability to manage health-related needs will improve Outcome: Progressing   Problem: Safety: Goal: Ability to remain free from injury will improve Outcome: Progressing   

## 2018-10-14 LAB — GLUCOSE, CAPILLARY
Glucose-Capillary: 100 mg/dL — ABNORMAL HIGH (ref 70–99)
Glucose-Capillary: 101 mg/dL — ABNORMAL HIGH (ref 70–99)
Glucose-Capillary: 101 mg/dL — ABNORMAL HIGH (ref 70–99)
Glucose-Capillary: 103 mg/dL — ABNORMAL HIGH (ref 70–99)
Glucose-Capillary: 108 mg/dL — ABNORMAL HIGH (ref 70–99)
Glucose-Capillary: 122 mg/dL — ABNORMAL HIGH (ref 70–99)

## 2018-10-14 LAB — BASIC METABOLIC PANEL
Anion gap: 9 (ref 5–15)
BUN: 25 mg/dL — AB (ref 8–23)
CALCIUM: 8.4 mg/dL — AB (ref 8.9–10.3)
CHLORIDE: 85 mmol/L — AB (ref 98–111)
CO2: 32 mmol/L (ref 22–32)
CREATININE: 0.99 mg/dL (ref 0.61–1.24)
GFR calc Af Amer: >60 mL/min (ref >60)
GFR calc non Af Amer: >60 mL/min (ref >60)
Glucose, Bld: 132 mg/dL — ABNORMAL HIGH (ref 70–99)
Potassium: 4.2 mmol/L (ref 3.5–5.1)
Sodium: 126 mmol/L — ABNORMAL LOW (ref 135–145)

## 2018-10-14 LAB — PROTIME-INR
INR: 1.34
Prothrombin Time: 16.4 seconds — ABNORMAL HIGH (ref 11.4–15.2)

## 2018-10-14 MED ORDER — WARFARIN SODIUM 5 MG PO TABS
5.0000 mg | ORAL_TABLET | Freq: Once | ORAL | Status: AC
  Administered 2018-10-14: 5 mg via ORAL
  Filled 2018-10-14: qty 1

## 2018-10-14 NOTE — Progress Notes (Signed)
PT Cancellation Note  Patient Details Name: Kirk Jones MRN: [id number] DOB: 08/02/1941   Cancelled Treatment:    Reason Eval/Treat Not Completed: Medical issues which prohibited therapy(Chart reviewed, Na:126 remains outside of range for OOB activity at this time. WIll hold PT eval at this time and attempt again at later date/time as able. )  2:37 PM, 10/14/18 Etta Grandchild, PT, DPT Physical Therapist - [facility]  [phone] ([place])     Ninah Moccio C 10/14/2018, 2:37 PM

## 2018-10-14 NOTE — Evaluation (Signed)
Physical Therapy Evaluation Patient Details Name: Kirk Jones MRN: [id number] DOB: [birth year] Today's Date: 10/14/2018   History of Present Illness  Shloimy Michalski is a 77yo male who presents after syncope and head injury. PMH: LungCA, COPD, CHF, AF, HTN, prosthetic aortic valve, AICD. Pt is undergoing Lung CA chemo 1x weekly and radiation 4x weekly,   Clinical Impression  Pt admitted with above diagnosis. Pt currently with functional limitations due to the deficits listed below (see "PT Problem List"). Upon entry, pt in bed, wife and brother present. The pt is A&Ox4 and agreeable to participate. The pt is alert and oriented x3, pleasant, and conversational. Mild BP drops with standing. Saturating well on 3L AMB, 2L at rest. Pt tolerating 159ft c RW at supervision level. Functional mobility assessment demonstrates increased effort/time requirements, poor tolerance, and need for physical assistance, whereas the patient performed these at a higher level of independence PTA. Pt will benefit from skilled PT intervention to increase independence and safety with basic mobility in preparation for discharge to the venue listed below.       Follow Up Recommendations Home health PT    Equipment Recommendations  None recommended by PT    Recommendations for Other Services       Precautions / Restrictions Precautions Precautions: None Restrictions Weight Bearing Restrictions: No      Mobility  Bed Mobility Overal bed mobility: Needs Assistance Bed Mobility: Supine to Sit     Supine to sit: Min assist     General bed mobility comments: Offered a hand hold, then he is able to pull self to sitting EOB   Transfers Overall transfer level: Needs assistance Equipment used: Rolling walker (2 wheeled) Transfers: Sit to/from Stand Sit to Stand: From elevated surface;Supervision         General transfer comment: 11x from EOB slightly elevated.   Ambulation/Gait Ambulation/Gait  assistance: Min guard;Supervision Gait Distance (Feet): 180 Feet Assistive device: Rolling walker (2 wheeled)       General Gait Details: PT assists with 48ft O2 tubing in room. 98% on 3L/min   Stairs            Wheelchair Mobility    Modified Rankin (Stroke Patients Only)       Balance Overall balance assessment: Needs assistance;History of Falls Sitting-balance support: Feet supported Sitting balance-Leahy Scale: Good     Standing balance support: Single extremity supported Standing balance-Leahy Scale: Fair                               Pertinent Vitals/Pain Pain Assessment: No/denies pain    Home Living Family/patient expects to be discharged to:: Private residence Living Arrangements: Spouse/significant other Available Help at Discharge: Family;Available 24 hours/day Type of Home: House Home Access: Stairs to enter Entrance Stairs-Rails: Psychiatric nurse of Steps: 4 Home Layout: Two level;Able to live on main level with bedroom/bathroom(does not access 2nd floor. ) Home Equipment: Gilford Rile - 2 wheels;Walker - 4 wheels;Cane - single point;Shower seat - built in      Prior Function Level of Independence: Independent with assistive device(s)         Comments: reports modI ADL req heavy effort and additional time; AMB in home with RW has become increasingly difficult and now on 2L/min.      Hand Dominance        Extremity/Trunk Assessment   Upper Extremity Assessment Upper Extremity Assessment: Overall WFL for tasks assessed  Lower Extremity Assessment Lower Extremity Assessment: Overall WFL for tasks assessed;Generalized weakness       Communication   Communication: No difficulties  Cognition Arousal/Alertness: Awake/alert Behavior During Therapy: WFL for tasks assessed/performed Overall Cognitive Status: Within Functional Limits for tasks assessed                                         General Comments      Exercises     Assessment/Plan    PT Assessment Patient needs continued PT services  PT Problem List Decreased mobility;Decreased balance;Decreased activity tolerance;Decreased strength;Cardiopulmonary status limiting activity       PT Treatment Interventions Gait training;Balance training;DME instruction;Therapeutic activities;Functional mobility training;Patient/family education;Therapeutic exercise    PT Goals (Current goals can be found in the Care Plan section)  Acute Rehab PT Goals Patient Stated Goal: Regain strength and improve balance  PT Goal Formulation: With patient Time For Goal Achievement: 10/28/18 Potential to Achieve Goals: Fair    Frequency Min 2X/week   Barriers to discharge        Co-evaluation               AM-PAC PT "6 Clicks" Daily Activity  Outcome Measure Difficulty turning over in bed (including adjusting bedclothes, sheets and blankets)?: A Lot Difficulty moving from lying on back to sitting on the side of the bed? : A Lot Difficulty sitting down on and standing up from a chair with arms (e.g., wheelchair, bedside commode, etc,.)?: Unable Help needed moving to and from a bed to chair (including a wheelchair)?: A Lot Help needed walking in hospital room?: A Lot Help needed climbing 3-5 steps with a railing? : A Lot 6 Click Score: 11    End of Session Equipment Utilized During Treatment: Gait belt;Oxygen Activity Tolerance: Patient tolerated treatment well;No increased pain;Patient limited by fatigue Patient left: in bed;with call bell/phone within reach;with family/visitor present(chaired-out) Nurse Communication: Mobility status PT Visit Diagnosis: Difficulty in walking, not elsewhere classified (R26.2);History of falling (Z91.81)    Time: 7824-2353 PT Time Calculation (min) (ACUTE ONLY): 36 min   Charges:   PT Evaluation $PT Eval Moderate Complexity: 1 Mod PT Treatments $Therapeutic Activity: 8-22 mins         4:36 PM, 10/14/18 Etta Grandchild, PT, DPT Physical Therapist - [facility]  [phone] ([place])    , C 10/14/2018, 4:32 PM

## 2018-10-14 NOTE — Plan of Care (Signed)

## 2018-10-14 NOTE — Progress Notes (Signed)
Patient with chronic hyponatremia. Can work with physical therapy.

## 2018-10-14 NOTE — Progress Notes (Signed)
[place] [facility] [place]    MR#:  [id number]  DATE OF BIRTH:  10/07/1941  SUBJECTIVE:  CHIEF COMPLAINT:  No chief complaint on file.  Feels weak.  Has chronic shortness of breath.. 2 L oxygen per  REVIEW OF SYSTEMS:    Review of Systems  Constitutional: Positive for malaise/fatigue. Negative for chills and fever.  HENT: Negative for sore throat.   Eyes: Negative for blurred vision, double vision and pain.  Respiratory: Positive for shortness of breath. Negative for cough, hemoptysis and wheezing.   Cardiovascular: Negative for chest pain, palpitations, orthopnea and leg swelling.  Gastrointestinal: Negative for abdominal pain, constipation, diarrhea, heartburn, nausea and vomiting.  Genitourinary: Negative for dysuria and hematuria.  Musculoskeletal: Negative for back pain and joint pain.  Skin: Negative for rash.  Neurological: Negative for sensory change, speech change, focal weakness and headaches.  Endo/Heme/Allergies: Does not bruise/bleed easily.  Psychiatric/Behavioral: Negative for depression. The patient is not nervous/anxious.     DRUG ALLERGIES:   Allergies  Allergen Reactions  . Prednisone Other (See Comments)    Reaction: "organs shutting down"    VITALS:  Blood pressure 109/67, pulse 96, temperature 97.8 F (36.6 C), temperature source Oral, resp. rate 18, height 5\' 7"  (1.702 m), weight 72.4 kg, SpO2 97 %.  PHYSICAL EXAMINATION:   Physical Exam  GENERAL:  77 y.o.-year-old patient lying in the bed with no acute distress.  EYES: Pupils equal, round, reactive to light and accommodation. No scleral icterus. Extraocular muscles intact.  HEENT: Head atraumatic, normocephalic. Oropharynx and nasopharynx clear.  NECK:  Supple, no jugular venous distention. No thyroid enlargement, no tenderness.  LUNGS: Normal breath sounds bilaterally, no wheezing, rales, rhonchi. No use of accessory muscles of  respiration.  CARDIOVASCULAR: S1, S2 irregular ABDOMEN: Soft, nontender, nondistended. Bowel sounds present. No organomegaly or mass.  EXTREMITIES: No cyanosis, clubbing or edema b/l.    NEUROLOGIC: Cranial nerves II through XII are intact. No focal Motor or sensory deficits b/l.   PSYCHIATRIC: The patient is alert and oriented x 3.  SKIN: No obvious rash, lesion, or ulcer.   LABORATORY PANEL:   CBC Recent Labs  Lab 10/11/18 2354  WBC 5.7  HGB 9.4*  HCT 28.0*  PLT 195   ------------------------------------------------------------------------------------------------------------------ Chemistries  Recent Labs  Lab 10/11/18 2354 10/14/18 0450  NA 129* 126*  K 3.6 4.2  CL 88* 85*  CO2 32 32  GLUCOSE 109* 132*  BUN 32* 25*  CREATININE 0.98 0.99  CALCIUM 7.8* 8.4*  AST 25  --   ALT 15  --   ALKPHOS 64  --   BILITOT 1.4*  --    ------------------------------------------------------------------------------------------------------------------  Cardiac Enzymes Recent Labs  Lab 10/12/18 1844  TROPONINI 0.06*   ------------------------------------------------------------------------------------------------------------------  RADIOLOGY:  Dg Hand 2 View Left  Result Date: 10/12/2018 CLINICAL DATA:  Pt reports he had a fall last night and is now experiencing pain in his left hand. Pt hand appears swollen and red at this time EXAM: LEFT HAND - 2 VIEW COMPARISON:  None. FINDINGS: IV tubing overlies the wrist. There are degenerative changes insert wrist, primarily along the radial aspect of the carpals and the first carpometacarpal joint. There is degenerative change at the second metacarpophalangeal joint. No acute fracture or subluxation. Atherosclerotic calcification of the small arteries. IMPRESSION: No evidence for acute  abnormality.  Degenerative changes. Electronically Signed   By: Nolon Nations M.D.   On: 10/12/2018 15:27  ASSESSMENT AND PLAN:   This is a  77 year old male admitted for syncope  * Syncope due to SVT on pacemaker interrogation. Also has history of ventricular tachycardia. On oral Cardizem and amiodarone drip.  Change to oral amiodarone. Discussed with Dr. Ubaldo Glassing of cardiology.    * Chronic combined diastolic and systolic CHF. No significant fluid overload.  *  Hyponatremia: Chronic; secondary to lung cancer.  *  Lung cancer stage 3: Status post cycle 2 of carbo/Taxol.  *  Diabetes mellitus type 2: Hold oral hypoglycemic agents.  Check hemoglobin A1c. Sliding scale insulin while hospitalized.  *  Paroxysmal Atrial fibrillation:Continue diltiazem for now.  Continue warfarin (status post aortic valve replacement)  *  Hypertension: Controlled .  All the records are reviewed and case discussed with Care Management/Social Worker Management plans discussed with the patient, family and they are in agreement.  CODE STATUS: FULL CODE  DVT Prophylaxis: SCDs  Wait for PT eval and HR control  Leia Alf Arriel Victor M.D on 10/14/2018 at 1:14 PM  Between 7am to 6pm - Pager - [phone]  After 6pm go to [email or web address] - password [email or web address] [facility]  Office  [phone]  CC: Primary care physician; Guadalupe Maple, MD  Note: This dictation was prepared with Dragon dictation along with smaller phrase technology. Any transcriptional errors that result from this process are unintentional.

## 2018-10-14 NOTE — Progress Notes (Signed)
Patient Name: Kirk Jones Date of Encounter: 10/14/2018  Hospital Problem List     Active Problems:   Syncope   Pressure injury of skin   Palliative care encounter    Patient Profile     Kirk Jones is a 77 year old male with a past medical history significant for aortic valve disease, s/p valve replacement in 2004, coronary artery disease s/p PCI of LAD, ischemic cardiomyopathy s/p ICD insertion in 2094, chronic systolic congestive heart failure, paroxysmal atrial fibrillation on warfarin, an ascending aortic aneurysm, chronic kidney disease, and hyperlipidemia who presented to the ED on 10/11/18 for a syncopal episode.   Interrogation of his defibrillator revealed tachycardia at the time his event.  There was no V. tach however this was narrow complex.  He was relatively volume depleted at that time.  He is receiving chemotherapy.  Blood pressure remains fairly soft although heart rate is improved on amiodarone which has been converted to p.o.  Patient is also on Cardizem CD.  Subjective   No weak but less dizzy still fatigued.  Inpatient Medications    . amiodarone  400 mg Oral BID  . aspirin EC  81 mg Oral Daily  . diltiazem  180 mg Oral Daily  . docusate sodium  100 mg Oral BID  . finasteride  5 mg Oral Daily  . furosemide  20 mg Oral Daily  . insulin aspart  0-5 Units Subcutaneous QHS  . insulin aspart  0-9 Units Subcutaneous Q4H  . magnesium oxide  400 mg Oral Daily  . pantoprazole  40 mg Oral Daily  . potassium chloride SA  20 mEq Oral Daily  . tiotropium  18 mcg Inhalation Daily  . warfarin  4 mg Oral q1800  . warfarin  5 mg Oral ONCE-1800  . Warfarin - Pharmacist Dosing Inpatient   Does not apply q1800    Vital Signs    Vitals:   10/14/18 0406 10/14/18 0734 10/14/18 0948 10/14/18 0950  BP: 107/62 104/70 96/60 109/67  Pulse: 85 86 93 96  Resp:      Temp: 98.2 F (36.8 C) 97.8 F (36.6 C)    TempSrc: Oral Oral    SpO2: 100% 100% 97%   Weight: 72.4  kg     Height:        Intake/Output Summary (Last 24 hours) at 10/14/2018 1031 Last data filed at 10/14/2018 0412 Gross per 24 hour  Intake 960 ml  Output 1051 ml  Net -91 ml   Filed Weights   10/12/18 0522 10/13/18 0439 10/14/18 0406  Weight: 71.5 kg 73.2 kg 72.4 kg    Physical Exam    GEN: Well nourished, well developed, in no acute distress.  HEENT: normal.  Neck: Supple, no JVD, carotid bruits, or masses. Cardiac: No fibrillation Respiratory:  Respirations regular and unlabored, clear to auscultation bilaterally. GI: Soft, nontender, nondistended, BS + x 4. MS: no deformity or atrophy. Skin: warm and dry, no rash. Neuro:  Strength and sensation are intact. Psych: Normal affect.  Labs    CBC Recent Labs    10/11/18 2354  WBC 5.7  HGB 9.4*  HCT 28.0*  MCV 92.1  PLT 709   Basic Metabolic Panel Recent Labs    10/11/18 2354 10/14/18 0450  NA 129* 126*  K 3.6 4.2  CL 88* 85*  CO2 32 32  GLUCOSE 109* 132*  BUN 32* 25*  CREATININE 0.98 0.99  CALCIUM 7.8* 8.4*   Liver Function Tests  Recent Labs    10/11/18 2354  AST 25  ALT 15  ALKPHOS 64  BILITOT 1.4*  PROT 5.6*  ALBUMIN 2.7*   No results for input(s): LIPASE, AMYLASE in the last 72 hours. Cardiac Enzymes Recent Labs    10/12/18 0620 10/12/18 1140 10/12/18 1844  TROPONINI 0.08* 0.07* 0.06*   BNP Recent Labs    10/11/18 2353  BNP 1,977.0*   D-Dimer No results for input(s): DDIMER in the last 72 hours. Hemoglobin A1C Recent Labs    10/12/18 0620  HGBA1C 6.2*   Fasting Lipid Panel No results for input(s): CHOL, HDL, LDLCALC, TRIG, CHOLHDL, LDLDIRECT in the last 72 hours. Thyroid Function Tests Recent Labs    10/12/18 0620  TSH 2.545    Telemetry    Atrial fibrillation with variable ventricular response  ECG    Atrial fibrillation with rapid ventricular response  Radiology    Ct Head Wo Contrast  Result Date: 10/12/2018 CLINICAL DATA:  Acute onset of dizziness and  fall. Hit head on headboard. Laceration at the right temple. Patient on Coumadin. Initial encounter. EXAM: CT HEAD WITHOUT CONTRAST TECHNIQUE: Contiguous axial images were obtained from the base of the skull through the vertex without intravenous contrast. COMPARISON:  CT of the head performed 09/04/2018 FINDINGS: Brain: No evidence of acute infarction, hemorrhage, hydrocephalus, extra-axial collection or mass lesion / mass effect. Prominence of the ventricles and sulci reflects mild to moderate cortical volume loss. Cerebellar atrophy is noted. Scattered periventricular and subcortical white matter change likely reflects small vessel ischemic microangiopathy. A small chronic lacunar infarct is noted at the left cerebellar hemisphere. The brainstem and fourth ventricle are within normal limits. The basal ganglia are unremarkable in appearance. The cerebral hemispheres demonstrate grossly normal gray-white differentiation. No mass effect or midline shift is seen. Vascular: No hyperdense vessel or unexpected calcification. Skull: There is no evidence of fracture; visualized osseous structures are unremarkable in appearance. Sinuses/Orbits: The visualized portions of the orbits are within normal limits. The paranasal sinuses and mastoid air cells are well-aerated. Other: A prominent soft tissue laceration is noted anterior to the right ear, with soft tissue air extending below the right ear. IMPRESSION: 1. No evidence of traumatic intracranial injury or fracture. 2. Prominent soft tissue laceration anterior to the right ear, with soft tissue air extending below the right ear. 3. Mild to moderate cortical volume loss and scattered small vessel ischemic microangiopathy. 4. Small chronic lacunar infarct at the left cerebellar hemisphere. Electronically Signed   By: Garald Balding M.D.   On: 10/12/2018 00:18   Dg Hand 2 View Left  Result Date: 10/12/2018 CLINICAL DATA:  Pt reports he had a fall last night and is now  experiencing pain in his left hand. Pt hand appears swollen and red at this time EXAM: LEFT HAND - 2 VIEW COMPARISON:  None. FINDINGS: IV tubing overlies the wrist. There are degenerative changes insert wrist, primarily along the radial aspect of the carpals and the first carpometacarpal joint. There is degenerative change at the second metacarpophalangeal joint. No acute fracture or subluxation. Atherosclerotic calcification of the small arteries. IMPRESSION: No evidence for acute  abnormality.  Degenerative changes. Electronically Signed   By: Nolon Nations M.D.   On: 10/12/2018 15:27   Ct Abdomen W Wo Contrast  Result Date: 09/25/2018 CLINICAL DATA:  Evaluate pancreatic calcifications EXAM: CT ABDOMEN WITHOUT AND WITH CONTRAST TECHNIQUE: Multidetector CT imaging of the abdomen was performed following the standard protocol before and following the bolus  administration of intravenous contrast. CONTRAST:  185mL ISOVUE-300 IOPAMIDOL (ISOVUE-300) INJECTION 61% COMPARISON:  PET-CT 08/30/2018. FINDINGS: Lower chest: Large right lower lobe lung mass is again identified measuring 7.9 cm. Loculated pleural fluid is identified overlying the right midlung and right lower lobe. Small left pleural effusion noted. Aortic atherosclerosis is identified. Hepatobiliary: No focal liver abnormality. The gallbladder is not confidently identified and may be surgically absent. Pancreas: No pancreatic inflammation identified faint punctate areas of mild hyperattenuation within the head of pancreas are again identified within head of pancreas, image 39/2 and may represent small calcifications. 1.4 cm low-density structure within the head of pancreas is identified measuring 32 HU, image 96/14. This is favored to represent a mildly dilated common bile duct. This roughly corresponds to the areas of calcification within the head of pancreas which may reflect choledocholithiasis. Body and tail of pancreas appear normal. Spleen: Spleen  normal. Adrenals/Urinary Tract: Unremarkable appearance of the adrenal glands. There is a cyst arising from inferior pole of right kidney measuring 3 cm, image 46/11. Smaller cysts noted within the upper pole of the right kidney. Multiple areas of cortical scarring involve the left kidney. Stomach/Bowel: Stomach and small bowel loops have a normal course and caliber. Extensive colonic diverticulosis noted. Vascular/Lymphatic: Aortic atherosclerosis. No aneurysm. No adenopathy identified within the abdomen. Other: Trace free fluid identified along the pericolic gutters. Musculoskeletal: Mild thoraco lumbar scoliosis and multilevel degenerative disc disease. IMPRESSION: 1. Faint calcifications within head of pancreas are again noted. There is cystic area of low attenuation within the head of pancreas which may represent a mildly dilated common bile duct. Calcifications may represent choledocholithiasis. In a patient who cannot have an MRI due to pacer device ERCP may be helpful to confirm common bile duct stones. 2. Large right lower lobe lung mass with surrounding loculated pleural fluid 3.  Aortic Atherosclerosis (ICD10-I70.0). Electronically Signed   By: Kerby Moors M.D.   On: 09/25/2018 16:27   Dg Chest Port 1 View  Result Date: 10/12/2018 CLINICAL DATA:  Status post fall, with concern for chest injury. Initial encounter. EXAM: PORTABLE CHEST 1 VIEW COMPARISON:  CT of the chest performed 08/21/2018 FINDINGS: There is a persistent small right-sided pleural effusion. The known right lower lobe mass is better characterized on prior CT. There is suggestion of underlying interstitial edema, which may be transient in nature. No pneumothorax is seen. The cardiomediastinal silhouette is borderline normal in size. The patient is status post median sternotomy. An AICD is noted overlying the left chest wall, with a single lead ending overlying the right ventricle. The patient's right chest port is noted ending about  the mid SVC. No displaced rib fractures are seen. IMPRESSION: 1. Persistent small right-sided pleural effusion. Known right lower lobe mass is better characterized on prior CT. Suggestion of underlying interstitial edema, which may be transient in nature. 2. No displaced rib fracture seen. Electronically Signed   By: Garald Balding M.D.   On: 10/12/2018 00:22    Assessment & Plan    Mr. Eley is a 77 year old male with a past medical history significant for aortic valve disease, s/p valve replacement in 2004, coronary artery disease s/p PCI of LAD, ischemic cardiomyopathy s/p ICD insertion in 2536, chronic systolic congestive heart failure, paroxysmal atrial fibrillation on warfarin, an ascending aortic aneurysm, chronic kidney disease, and hyperlipidemia who presented to the ED on 10/11/18 for a syncopal episode.   Currently in atrial fibrillation with improved rate control with amiodarone and Cardizem.  Blood pressure is somewhat soft.  Would recommend continue with amiodarone 400 mg twice daily and continue with Cardizem at 180 milligrams daily.  Will transiently stop tamsulosin secondary to relative hypotension.  Would ambulate the patient today as he has been somewhat dizzy with ambulation.  If stable and heart rate remained stable consideration for discharge could be raised.  Signed, Javier Docker  MD 10/14/2018, 10:31 AM  Pager: [phone]

## 2018-10-14 NOTE — Progress Notes (Signed)
[place] for warfarin dosing Indication: mechanical valve  Allergies  Allergen Reactions  . Prednisone Other (See Comments)    Reaction: "organs shutting down"    Patient Measurements: Height: 5\' 7"  (170.2 cm) Weight: 159 lb 9.6 oz (72.4 kg) IBW/kg (Calculated) : 66.1 Heparin Dosing Weight: n/a  Vital Signs: Temp: 97.8 F (36.6 C) (11/16 0734) Temp Source: Oral (11/16 0734) BP: 104/70 (11/16 0734) Pulse Rate: 86 (11/16 0734)  Labs: Recent Labs    10/11/18 2354 10/12/18 0620 10/12/18 1140 10/12/18 1844 10/13/18 0338 10/14/18 0450  HGB 9.4*  --   --   --   --   --   HCT 28.0*  --   --   --   --   --   PLT 195  --   --   --   --   --   LABPROT 17.1*  --   --   --  17.5* 16.4*  INR 1.41  --   --   --  1.45 1.34  CREATININE 0.98  --   --   --   --  0.99  TROPONINI 0.08* 0.08* 0.07* 0.06*  --   --     Estimated Creatinine Clearance: 58.4 mL/min (by C-G formula based on SCr of 0.99 mg/dL).   Medical History: Past Medical History:  Diagnosis Date  . AICD (automatic cardioverter/defibrillator) present   . Aortic valve regurgitation   . Atrial fibrillation ([place])   . Barrett's esophagus   . Cancer ([place])    sate III lung  . CHF (congestive heart failure) ([place])   . COPD (chronic obstructive pulmonary disease) ([place])   . Diverticulosis   . Dysrhythmia   . GERD (gastroesophageal reflux disease)   . Gout   . Headache    aura only  . Heart murmur   . Hematuria   . Hernia of abdominal cavity   . History of prosthetic heart valve   . Hyperlipidemia   . Hypertension   . Nodular prostate with urinary obstruction   . Presence of permanent cardiac pacemaker   . Renal insufficiency   . Substance abuse ([place])    alcohol    Medications:  Patient home dose of Warfarin is 3mg   Assessment: Patient still subtherapeutic  Goal of Therapy:  INR 2.5-3.5  Plan:  Give Warfarin 5mg  PO x 1 dose tonight. Check INR  daily.  Olivia Canter [facility] Clinical Pharmacist 10/14/2018 8:34 AM

## 2018-10-15 ENCOUNTER — Inpatient Hospital Stay: Payer: Medicare Other

## 2018-10-15 LAB — CBC
HEMATOCRIT: 24.5 % — AB (ref 39.0–52.0)
Hemoglobin: 8.3 g/dL — ABNORMAL LOW (ref 13.0–17.0)
MCH: 31.4 pg (ref 26.0–34.0)
MCHC: 33.9 g/dL (ref 30.0–36.0)
MCV: 92.8 fL (ref 80.0–100.0)
NRBC: 0 % (ref 0.0–0.2)
Platelets: 177 10*3/uL (ref 150–400)
RBC: 2.64 MIL/uL — ABNORMAL LOW (ref 4.22–5.81)
RDW: 15.5 % (ref 11.5–15.5)
WBC: 5 10*3/uL (ref 4.0–10.5)

## 2018-10-15 LAB — GLUCOSE, CAPILLARY
Glucose-Capillary: 116 mg/dL — ABNORMAL HIGH (ref 70–99)
Glucose-Capillary: 89 mg/dL (ref 70–99)
Glucose-Capillary: 93 mg/dL (ref 70–99)
Glucose-Capillary: 99 mg/dL (ref 70–99)

## 2018-10-15 LAB — PROTIME-INR
INR: 1.53
Prothrombin Time: 18.2 seconds — ABNORMAL HIGH (ref 11.4–15.2)

## 2018-10-15 MED ORDER — AMIODARONE HCL 200 MG PO TABS: 400.0000 mg | ORAL_TABLET | Freq: Two times a day (BID) | ORAL | 0 refills | Status: DC

## 2018-10-15 MED ORDER — AMIODARONE HCL 200 MG PO TABS
400.0000 mg | ORAL_TABLET | Freq: Once | ORAL | Status: AC
  Administered 2018-10-15: 400 mg via ORAL
  Filled 2018-10-15: qty 2

## 2018-10-15 MED ORDER — WARFARIN SODIUM 7.5 MG PO TABS
7.5000 mg | ORAL_TABLET | Freq: Once | ORAL | Status: DC
  Filled 2018-10-15: qty 1

## 2018-10-15 NOTE — Progress Notes (Signed)
Kirk Jones to be D/C'd Home per MD order.  Discussed prescriptions and follow up appointments with the patient. Prescriptions electronically submitted. medication list explained in detail. Pt verbalized understanding.  Allergies as of 10/15/2018      Reactions   Prednisone Other (See Comments)   Reaction: "organs shutting down"      Medication List    STOP taking these medications   diltiazem 180 MG 24 hr capsule Commonly known as:  DILACOR XR   losartan 25 MG tablet Commonly known as:  COZAAR   metoprolol succinate 50 MG 24 hr tablet Commonly known as:  TOPROL-XL     TAKE these medications   acetaminophen 650 MG CR tablet Commonly known as:  TYLENOL Take 650 mg by mouth 2 (two) times daily.   amiodarone 200 MG tablet Commonly known as:  PACERONE Take 2 tablets (400 mg total) by mouth 2 (two) times daily.   aspirin EC 81 MG tablet Take 81 mg by mouth daily.   CENTRUM ADULTS PO Take 1 tablet by mouth daily.   COLCRYS 0.6 MG tablet Generic drug:  colchicine TAKE 1 TABLET BY MOUTH ONCE DAILY   desonide 0.05 % cream Commonly known as:  DESOWEN Apply topically 2 (two) times daily.   dexamethasone 4 MG tablet Commonly known as:  DECADRON Take 2 tablets (8 mg total) by mouth daily. Start the day after chemotherapy for 2 days.   diltiazem 180 MG 24 hr capsule Commonly known as:  CARDIZEM CD   empagliflozin 10 MG Tabs tablet Commonly known as:  JARDIANCE Take 10 mg by mouth daily.   fexofenadine 180 MG tablet Commonly known as:  ALLEGRA Take 180 mg by mouth daily.   finasteride 5 MG tablet Commonly known as:  PROSCAR TAKE 1 TABLET BY MOUTH ONCE DAILY   fluticasone 50 MCG/ACT nasal spray Commonly known as:  FLONASE Place 2 sprays daily into both nostrils.   furosemide 40 MG tablet Commonly known as:  LASIX Take 40 mg by mouth daily.   ketoconazole 2 % shampoo Commonly known as:  NIZORAL Apply 1 application topically 2 (two) times a week.    lidocaine-prilocaine cream Commonly known as:  EMLA Apply to affected area once   loratadine 10 MG tablet Commonly known as:  CLARITIN Take 1 tablet (10 mg total) by mouth daily.   LORazepam 0.5 MG tablet Commonly known as:  ATIVAN Take 1 tablet (0.5 mg total) by mouth every 6 (six) hours as needed (Nausea or vomiting).   Magnesium 400 MG Tabs Take 400 mg by mouth daily.   omeprazole 20 MG capsule Commonly known as:  PRILOSEC Take 1 capsule (20 mg total) by mouth daily.   ondansetron 8 MG tablet Commonly known as:  ZOFRAN Take 1 tablet (8 mg total) by mouth 2 (two) times daily as needed for refractory nausea / vomiting. Start on day 3 after chemo.   potassium chloride SA 20 MEQ tablet Commonly known as:  K-DUR,KLOR-CON Take 1 tablet (20 mEq total) by mouth daily.   prochlorperazine 10 MG tablet Commonly known as:  COMPAZINE Take 1 tablet (10 mg total) by mouth every 6 (six) hours as needed (Nausea or vomiting).   SYSTANE OP Place 1 drop into both eyes as needed (for dry eyes).   tamsulosin 0.4 MG Caps capsule Commonly known as:  FLOMAX Take 2 capsules (0.8 mg total) by mouth daily. What changed:  how much to take   tiotropium 18 MCG inhalation capsule Commonly known  as:  SPIRIVA Place 1 capsule (18 mcg total) into inhaler and inhale daily.   UNABLE TO FIND CBD Hemp topical cream   warfarin 4 MG tablet Commonly known as:  COUMADIN TAKE 1 TABLET BY MOUTH ONCE DAILY AT 6 P.M.   warfarin 3 MG tablet Commonly known as:  COUMADIN TAKE 1 TABLET BY MOUTH ONCE DAILY.   warfarin 1 MG tablet Commonly known as:  COUMADIN TAKE 1 TABLET BY MOUTH ONCE DAILY       Vitals:   10/15/18 0752 10/15/18 1019  BP: 95/63 (!) 99/59  Pulse: 99   Resp:    Temp: 98 F (36.7 C)   SpO2: 100%     Skin clean, dry and intact without evidence of skin break down, no evidence of skin tears noted. IV catheter discontinued intact. Site without signs and symptoms of complications.  Dressing and pressure applied. Pt denies pain at this time. No complaints noted. Personal 02 tank at bedside  An After Visit Summary was printed and given to the patient. Patient escorted via [name], and D/C home via private auto.  Kirk Jones

## 2018-10-15 NOTE — Progress Notes (Signed)
Pt blood sugar is 93 ( as per tech amanda). Glucometer not sinking yet. Will continue to monitor.

## 2018-10-15 NOTE — Progress Notes (Signed)
[place] for warfarin dosing Indication: mechanical valve  Allergies  Allergen Reactions  . Prednisone Other (See Comments)    Reaction: "organs shutting down"    Patient Measurements: Height: 5\' 7"  (170.2 cm) Weight: 162 lb 3.2 oz (73.6 kg) IBW/kg (Calculated) : 66.1  Vital Signs: Temp: 97.8 F (36.6 C) (11/17 0501) Temp Source: Oral (11/16 2004) BP: 104/56 (11/17 0501) Pulse Rate: 78 (11/17 0501)  Labs: Recent Labs    10/12/18 1140 10/12/18 1844 10/13/18 0338 10/14/18 0450 10/15/18 0448  LABPROT  --   --  17.5* 16.4* 18.2*  INR  --   --  1.45 1.34 1.53  CREATININE  --   --   --  0.99  --   TROPONINI 0.07* 0.06*  --   --   --     Estimated Creatinine Clearance: 58.4 mL/min (by C-G formula based on SCr of 0.99 mg/dL).   Medical History: Past Medical History:  Diagnosis Date  . AICD (automatic cardioverter/defibrillator) present   . Aortic valve regurgitation   . Atrial fibrillation ([place])   . Barrett's esophagus   . Cancer ([place])    sate III lung  . CHF (congestive heart failure) ([place])   . COPD (chronic obstructive pulmonary disease) ([place])   . Diverticulosis   . Dysrhythmia   . GERD (gastroesophageal reflux disease)   . Gout   . Headache    aura only  . Heart murmur   . Hematuria   . Hernia of abdominal cavity   . History of prosthetic heart valve   . Hyperlipidemia   . Hypertension   . Nodular prostate with urinary obstruction   . Presence of permanent cardiac pacemaker   . Renal insufficiency   . Substance abuse ([place])    alcohol    Medications:  Patient home dose of Warfarin is 3mg   Assessment: Patient still subtherapeutic  Goal of Therapy:  INR 2.5-3.5  Plan:  Give Warfarin 7.5mg  PO x 1 dose tonight. Check INR daily.  Olivia Canter [facility] Clinical Pharmacist 10/15/2018 7:03 AM

## 2018-10-15 NOTE — Discharge Instructions (Addendum)
°-   Daily fluids < 2 liters. - Low salt diet - Check weight everyday and keep log. Take to your doctors appt. - Take extra dose of lasix if you gain more than 3 pounds weight.

## 2018-10-15 NOTE — Care Management Note (Signed)
Case Management Note  Patient Details  Name: Markcus Lazenby MRN: [id number] Date of Birth: [birth year]  Subjective/Objective:     Patient to be discharged per MD order. Orders in place for home health services. Previous RNCM had began workup with [facility]. Patient and spouse still prefer to use [facility]. Resumption of care referral confirmed with [facility]. No DME needs. Family to transport.                 Action/Plan:   Expected Discharge Date:  10/15/18               Expected Discharge Plan:  [place]  In-House Referral:  [facility]  Discharge planning Services  CM Consult  Post Acute Care Choice:  Resumption of Svcs/PTA Provider Choice offered to:     DME Arranged:    DME Agency:  [facility] Arranged:  RN, PT, OT, Nurse's Aide, Social Work [employer] Agency:  [facility]  Status of Service:  Completed, signed off  If discussed at [employer], dates discussed:    Additional Comments:  Latanya Maudlin, RN 10/15/2018, 3:51 PM

## 2018-10-16 ENCOUNTER — Other Ambulatory Visit: Payer: Self-pay | Admitting: *Deleted

## 2018-10-16 ENCOUNTER — Telehealth: Payer: Self-pay

## 2018-10-16 ENCOUNTER — Ambulatory Visit: Payer: Medicare Other

## 2018-10-16 ENCOUNTER — Ambulatory Visit
Admission: RE | Admit: 2018-10-16 | Discharge: 2018-10-16 | Disposition: A | Discharge status: Home / Self Care | Payer: Medicare Other | Source: Ambulatory Visit | Attending: Radiation Oncology | Admitting: Radiation Oncology

## 2018-10-16 DIAGNOSIS — I130 Hypertensive heart and chronic kidney disease with heart failure and stage 1 through stage 4 chronic kidney disease, or unspecified chronic kidney disease: Secondary | ICD-10-CM | POA: Diagnosis not present

## 2018-10-16 DIAGNOSIS — I5022 Chronic systolic (congestive) heart failure: Secondary | ICD-10-CM | POA: Diagnosis not present

## 2018-10-16 DIAGNOSIS — Z510 Encounter for antineoplastic radiation therapy: Secondary | ICD-10-CM | POA: Diagnosis not present

## 2018-10-16 DIAGNOSIS — J449 Chronic obstructive pulmonary disease, unspecified: Secondary | ICD-10-CM | POA: Diagnosis not present

## 2018-10-16 DIAGNOSIS — C3431 Malignant neoplasm of lower lobe, right bronchus or lung: Secondary | ICD-10-CM | POA: Diagnosis not present

## 2018-10-16 DIAGNOSIS — M6281 Muscle weakness (generalized): Secondary | ICD-10-CM | POA: Diagnosis not present

## 2018-10-16 DIAGNOSIS — R2689 Other abnormalities of gait and mobility: Secondary | ICD-10-CM | POA: Diagnosis not present

## 2018-10-16 NOTE — Assessment & Plan Note (Signed)
Stable, rate well controlled currently. Await INR reading tomorrow with Oncology. Continue current coumadin dose in meantime

## 2018-10-16 NOTE — Assessment & Plan Note (Signed)
Await INR reading from Oncology tomorrow, will check lab results and adjust as needed. May need to make more sustainable long-term plan for management, possibly need to hand off INRs to specialist during chemotherapy

## 2018-10-16 NOTE — Patient Instructions (Signed)
Follow up in 4 weeks for INR

## 2018-10-16 NOTE — Telephone Encounter (Signed)
Transition Care Management Follow-up Telephone Call  Date of discharge and from where: [facility] on 10/15/18.  How have you been since you were released from the hospital? Doing better, feeling good just weak. Head is also doing much better. Declines fever, dizziness or n/v/d.  Any questions or concerns? No   Items Reviewed:  Did the pt receive and understand the discharge instructions provided? Yes   Medications obtained and verified? No, will review at [place] apt.  Any new allergies since your discharge? No   Dietary orders reviewed? Yes  Do you have support at home? Yes   Other (ie: DME, Home Health, etc) N/A  Functional Questionnaire: (I = Independent and D = Dependent)  Bathing/Dressing- Has assistance from wife.   Meal Prep- Daughters are cooking for pt and wife currently.   Eating- I  Maintaining continence- Occasionally, does not wear depends.   Transferring/Ambulation- D, uses a cane or walker as needed.  Managing Meds- I   Follow up appointments reviewed:    PCP Hospital f/u appt confirmed? No , pt to call office to schedule HFU apt.  [facility] f/u appt confirmed? Yes    Are transportation arrangements needed? No  If their condition worsens, is the pt aware to call  their PCP or go to the ED? No  Was the patient provided with contact information for the PCP's office or ED? Yes  Was the pt encouraged to call back with questions or concerns? Yes

## 2018-10-16 NOTE — Assessment & Plan Note (Signed)
Swelling improved, no CP or SOB. Continue current regimen

## 2018-10-16 NOTE — Patient Outreach (Signed)
[facility]) [place] Telephone Outreach  PCP office completes Transition of Care follow up post-hospital discharge Post-hospital discharge day # 1 Unsuccessful outreach attempt # 1  10/16/2018  Kirk Jones [facility] Jul 23, 1941 [id number]  Unsuccessful telephone outreach attempt to Kirk Jones, 77 y/o male referred to [facility] by inpatient RN CM after recent hospitalization November 14-17, 2019 for syncope/ mechanical fall with head injury- laceration requiring 16 sutures.  Patient was discharged home from hospital to self-care with home health services in place through [facility].  Patient has history including, but not limited to, A-Fib; HTN/ HLD; COPD, on home O2 as baseline at 2 L/min via [place]; CKD- stage III; CAD with previous aortic valve replacement, pacemaker, and ICD; (s) CHF; previous CVA; and lung cancer.  HIPAA compliant voice mail message left for patient, requesting return call back.  Plan:  Will place [facility] CM unsuccessful patient outreach letter in mail requesting call back in writing  Will re-attempt Kirk Jones telephone outreach later this week if I do not hear back from patient first.  Oneta Rack, RN, BSN, [place] Coordinator [facility] Care Management  [phone]

## 2018-10-17 ENCOUNTER — Ambulatory Visit
Admission: RE | Admit: 2018-10-17 | Discharge: 2018-10-17 | Disposition: A | Discharge status: Home / Self Care | Payer: Medicare Other | Source: Ambulatory Visit | Attending: Radiation Oncology | Admitting: Radiation Oncology

## 2018-10-17 ENCOUNTER — Ambulatory Visit: Payer: Medicare Other | Admitting: Family Medicine

## 2018-10-17 DIAGNOSIS — C3431 Malignant neoplasm of lower lobe, right bronchus or lung: Secondary | ICD-10-CM | POA: Diagnosis not present

## 2018-10-17 DIAGNOSIS — Z510 Encounter for antineoplastic radiation therapy: Secondary | ICD-10-CM | POA: Diagnosis not present

## 2018-10-18 ENCOUNTER — Ambulatory Visit
Admission: RE | Admit: 2018-10-18 | Discharge: 2018-10-18 | Disposition: A | Discharge status: Home / Self Care | Payer: Medicare Other | Source: Ambulatory Visit | Attending: Radiation Oncology | Admitting: Radiation Oncology

## 2018-10-18 ENCOUNTER — Telehealth: Payer: Self-pay

## 2018-10-18 DIAGNOSIS — C3431 Malignant neoplasm of lower lobe, right bronchus or lung: Secondary | ICD-10-CM | POA: Diagnosis not present

## 2018-10-18 NOTE — Telephone Encounter (Signed)
error 

## 2018-10-19 ENCOUNTER — Inpatient Hospital Stay: Payer: Medicare Other

## 2018-10-19 ENCOUNTER — Inpatient Hospital Stay (HOSPITAL_BASED_OUTPATIENT_CLINIC_OR_DEPARTMENT_OTHER): Payer: Medicare Other | Admitting: Oncology

## 2018-10-19 ENCOUNTER — Encounter: Payer: Self-pay | Admitting: Oncology

## 2018-10-19 ENCOUNTER — Inpatient Hospital Stay (HOSPITAL_BASED_OUTPATIENT_CLINIC_OR_DEPARTMENT_OTHER): Payer: Medicare Other | Admitting: Hospice and Palliative Medicine

## 2018-10-19 ENCOUNTER — Inpatient Hospital Stay: Payer: Medicare Other | Admitting: Family Medicine

## 2018-10-19 ENCOUNTER — Ambulatory Visit: Payer: Medicare Other

## 2018-10-19 ENCOUNTER — Ambulatory Visit
Admission: RE | Admit: 2018-10-19 | Discharge: 2018-10-19 | Disposition: A | Discharge status: Home / Self Care | Payer: Medicare Other | Source: Ambulatory Visit | Attending: Radiation Oncology | Admitting: Radiation Oncology

## 2018-10-19 VITALS — BP 107/71 | HR 128 | Temp 97.1°F | Resp 18 | Ht 67.0 in | Wt 160.8 lb

## 2018-10-19 VITALS — HR 114

## 2018-10-19 DIAGNOSIS — C3491 Malignant neoplasm of unspecified part of right bronchus or lung: Secondary | ICD-10-CM

## 2018-10-19 DIAGNOSIS — E46 Unspecified protein-calorie malnutrition: Secondary | ICD-10-CM | POA: Diagnosis not present

## 2018-10-19 DIAGNOSIS — N179 Acute kidney failure, unspecified: Secondary | ICD-10-CM

## 2018-10-19 DIAGNOSIS — Z5111 Encounter for antineoplastic chemotherapy: Secondary | ICD-10-CM

## 2018-10-19 DIAGNOSIS — Z515 Encounter for palliative care: Secondary | ICD-10-CM

## 2018-10-19 DIAGNOSIS — C3431 Malignant neoplasm of lower lobe, right bronchus or lung: Secondary | ICD-10-CM | POA: Diagnosis not present

## 2018-10-19 LAB — COMPREHENSIVE METABOLIC PANEL
ALBUMIN: 2.9 g/dL — AB (ref 3.5–5.0)
ALT: 17 U/L (ref 0–44)
ANION GAP: 9 (ref 5–15)
AST: 31 U/L (ref 15–41)
Alkaline Phosphatase: 93 U/L (ref 38–126)
BILIRUBIN TOTAL: 1 mg/dL (ref 0.3–1.2)
BUN: 27 mg/dL — ABNORMAL HIGH (ref 8–23)
CO2: 29 mmol/L (ref 22–32)
Calcium: 8.6 mg/dL — ABNORMAL LOW (ref 8.9–10.3)
Chloride: 89 mmol/L — ABNORMAL LOW (ref 98–111)
Creatinine, Ser: 1.32 mg/dL — ABNORMAL HIGH (ref 0.61–1.24)
GFR calc Af Amer: 58 mL/min — ABNORMAL LOW (ref >60)
GFR calc non Af Amer: 50 mL/min — ABNORMAL LOW (ref >60)
GLUCOSE: 156 mg/dL — AB (ref 70–99)
POTASSIUM: 4.4 mmol/L (ref 3.5–5.1)
SODIUM: 127 mmol/L — AB (ref 135–145)
Total Protein: 6.1 g/dL — ABNORMAL LOW (ref 6.5–8.1)

## 2018-10-19 LAB — CBC WITH DIFFERENTIAL/PLATELET
Abs Immature Granulocytes: 0.03 10*3/uL (ref 0.00–0.07)
BASOS PCT: 0 %
Basophils Absolute: 0 10*3/uL (ref 0.0–0.1)
Eosinophils Absolute: 0 10*3/uL (ref 0.0–0.5)
Eosinophils Relative: 0 %
HEMATOCRIT: 26.1 % — AB (ref 39.0–52.0)
Hemoglobin: 8.9 g/dL — ABNORMAL LOW (ref 13.0–17.0)
Immature Granulocytes: 1 %
LYMPHS ABS: 0.2 10*3/uL — AB (ref 0.7–4.0)
Lymphocytes Relative: 3 %
MCH: 30.8 pg (ref 26.0–34.0)
MCHC: 34.1 g/dL (ref 30.0–36.0)
MCV: 90.3 fL (ref 80.0–100.0)
MONOS PCT: 10 %
Monocytes Absolute: 0.7 10*3/uL (ref 0.1–1.0)
NEUTROS ABS: 5.4 10*3/uL (ref 1.7–7.7)
NEUTROS PCT: 86 %
PLATELETS: 244 10*3/uL (ref 150–400)
RBC: 2.89 MIL/uL — AB (ref 4.22–5.81)
RDW: 14.9 % (ref 11.5–15.5)
WBC: 6.3 10*3/uL (ref 4.0–10.5)
nRBC: 0 % (ref 0.0–0.2)

## 2018-10-19 MED ORDER — FAMOTIDINE IN NACL 20-0.9 MG/50ML-% IV SOLN
20.0000 mg | Freq: Once | INTRAVENOUS | Status: AC
  Administered 2018-10-19: 20 mg via INTRAVENOUS
  Filled 2018-10-19: qty 50

## 2018-10-19 MED ORDER — HEPARIN SOD (PORK) LOCK FLUSH 100 UNIT/ML IV SOLN
500.0000 [IU] | Freq: Once | INTRAVENOUS | Status: AC
  Administered 2018-10-19: 500 [IU] via INTRAVENOUS
  Filled 2018-10-19: qty 5

## 2018-10-19 MED ORDER — PALONOSETRON HCL INJECTION 0.25 MG/5ML
0.2500 mg | Freq: Once | INTRAVENOUS | Status: AC
  Administered 2018-10-19: 0.25 mg via INTRAVENOUS
  Filled 2018-10-19: qty 5

## 2018-10-19 MED ORDER — SODIUM CHLORIDE 0.9 % IV SOLN
Freq: Once | INTRAVENOUS | Status: AC
  Administered 2018-10-19: 10:00:00 via INTRAVENOUS
  Filled 2018-10-19: qty 250

## 2018-10-19 MED ORDER — SODIUM CHLORIDE 0.9% FLUSH
10.0000 mL | INTRAVENOUS | Status: DC | PRN
  Administered 2018-10-19: 10 mL via INTRAVENOUS
  Filled 2018-10-19: qty 10

## 2018-10-19 MED ORDER — SODIUM CHLORIDE 0.9 % IV SOLN
45.0000 mg/m2 | Freq: Once | INTRAVENOUS | Status: AC
  Administered 2018-10-19: 84 mg via INTRAVENOUS
  Filled 2018-10-19: qty 14

## 2018-10-19 MED ORDER — SODIUM CHLORIDE 0.9 % IV SOLN
110.0000 mg | Freq: Once | INTRAVENOUS | Status: AC
  Administered 2018-10-19: 110 mg via INTRAVENOUS
  Filled 2018-10-19: qty 11

## 2018-10-19 MED ORDER — SODIUM CHLORIDE 0.9 % IV SOLN
20.0000 mg | Freq: Once | INTRAVENOUS | Status: AC
  Administered 2018-10-19: 20 mg via INTRAVENOUS
  Filled 2018-10-19: qty 2

## 2018-10-19 MED ORDER — DIPHENHYDRAMINE HCL 50 MG/ML IJ SOLN
25.0000 mg | Freq: Once | INTRAMUSCULAR | Status: AC
  Administered 2018-10-19: 25 mg via INTRAVENOUS
  Filled 2018-10-19: qty 1

## 2018-10-19 NOTE — Progress Notes (Signed)
HR 128. 541ml bolus NS given. HR 114. Per Dr Janese Banks okay to proceed with taxol today

## 2018-10-19 NOTE — Progress Notes (Signed)
[place]  [phone] [phone]   Name: [facility] Date: 10/19/2018 MRN: [id number]  DOB: 1941-02-10  Patient Care Team: Guadalupe Maple, MD as PCP - General (Family Medicine) Murlean Iba, MD (Internal Medicine) Isaias Cowman, MD as Consulting Physician (Cardiology) Christene Lye, MD (General Surgery) Telford Nab, RN as Registered Nurse Knox Royalty, RN as [place]: Palliative Care consult requested for this 77 y.o. male with multiple medical problems including stage IIIa lung cancer undergoing concurrent chemoradiation treated with carbotaxol, severe aortic valve disease status post valve replacement 2004, CAD status post PCI, ischemic cardiomyopathy status post AICD insertion in 2012 with history of CHF.  PAF on warfarin, history of AAA, and CKD.  Patient was hospitalized on 09/11/2018 to 10/15/18 with syncope due to SVT. Patient was found to have A. fib with RVR treated with amiodarone.  Patient has been referred to palliative care to help address goals.   SOCIAL HISTORY:    Patient is married lives at home with his wife.  He has 2 adult daughters from a previous marriage. 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  Patient would also want daughters to be involved in decision-making.  Patient has a living will.  CODE STATUS: Full code  PAST MEDICAL HISTORY: Past Medical History:  Diagnosis Date  . AICD (automatic cardioverter/defibrillator) present   . Aortic valve regurgitation   . Atrial fibrillation ([place])   . Barrett's esophagus   . Cancer ([place])    sate III lung  . CHF (congestive heart failure) ([place])   . COPD (chronic obstructive pulmonary disease) ([place])   .  Diverticulosis   . Dysrhythmia   . GERD (gastroesophageal reflux disease)   . Gout   . Headache    aura only  . Heart murmur   . Hematuria   . Hernia of abdominal cavity   . History of prosthetic heart valve   . Hyperlipidemia   . Hypertension   . Nodular prostate with urinary obstruction   . Presence of permanent cardiac pacemaker   . Renal insufficiency   . Substance abuse ([place])    alcohol    PAST SURGICAL HISTORY:  Past Surgical History:  Procedure Laterality Date  . CARDIAC VALVE REPLACEMENT N/A 2004  . CARPAL TUNNEL RELEASE Right 2015  . CATARACT EXTRACTION Right 2017  . COLONOSCOPY WITH PROPOFOL N/A 2014  . CORONARY ANGIOPLASTY     stints x2  . EYE SURGERY     cross eyed  . FOOT SURGERY Bilateral    [place]  . FRACTURE SURGERY Right    [place]  . INGUINAL HERNIA REPAIR Right 09/16/2017   Procedure: HERNIA REPAIR INGUINAL ADULT;  Surgeon: Christene Lye, MD;  Location: [facility] ORS;  Service: General;  Laterality: Right;  . INSERT / REPLACE / [place]     ICD  . PACEMAKER IMPLANT Left 2013   and ICD  . PORTA CATH INSERTION N/A 09/21/2018   Procedure: PORTA CATH INSERTION;  Surgeon: Algernon Huxley, MD;  Location: [facility];  Service: Cardiovascular;  Laterality: N/A;  . VASECTOMY      HEMATOLOGY/ONCOLOGY HISTORY:    Squamous cell carcinoma of right lung ([place])   09/14/2018 Cancer Staging    Staging form:  Lung, AJCC 8th Edition - Clinical stage from 09/14/2018: Stage IIIA (cT3, cN1, cM0) - Signed by Sindy Guadeloupe, MD on 09/16/2018    09/16/2018 Initial Diagnosis    Squamous cell carcinoma of right lung ([place])    09/27/2018 -  Chemotherapy    The patient had palonosetron (ALOXI) injection 0.25 mg, 0.25 mg, Intravenous,  Once, 3 of 4 cycles Administration: 0.25 mg (09/28/2018), 0.25 mg (10/05/2018), 0.25 mg (10/19/2018) CARBOplatin (PARAPLATIN) 180 mg in sodium chloride 0.9 % 250 mL chemo infusion, 180 mg (100 % of original dose 182.6 mg),  Intravenous,  Once, 3 of 4 cycles Dose modification:   (original dose 182.6 mg, Cycle 1) Administration: 180 mg (09/28/2018), 160 mg (10/05/2018) PACLitaxel (TAXOL) 84 mg in sodium chloride 0.9 % 250 mL chemo infusion (</= 15m/m2), 45 mg/m2 = 84 mg, Intravenous,  Once, 3 of 4 cycles Administration: 84 mg (09/28/2018), 84 mg (10/05/2018)  for chemotherapy treatment.      ALLERGIES:  is allergic to prednisone.  MEDICATIONS:  Current Outpatient Medications  Medication Sig Dispense Refill  . acetaminophen (TYLENOL) 650 MG CR tablet Take 650 mg by mouth 2 (two) times daily.    .Marland Kitchenamiodarone (PACERONE) 200 MG tablet Take 2 tablets (400 mg total) by mouth 2 (two) times daily. 120 tablet 0  . aspirin EC 81 MG tablet Take 81 mg by mouth daily.    .Marland KitchenCOLCRYS 0.6 MG tablet TAKE 1 TABLET BY MOUTH ONCE DAILY 30 tablet 1  . desonide (DESOWEN) 0.05 % cream Apply topically 2 (two) times daily. 60 g 0  . dexamethasone (DECADRON) 4 MG tablet Take 2 tablets (8 mg total) by mouth daily. Start the day after chemotherapy for 2 days. (Patient not taking: Reported on 10/05/2018) 30 tablet 1  . diltiazem (CARDIZEM CD) 180 MG 24 hr capsule   3  . empagliflozin (JARDIANCE) 10 MG TABS tablet Take 10 mg by mouth daily.    . fexofenadine (ALLEGRA) 180 MG tablet Take 180 mg by mouth daily.    . finasteride (PROSCAR) 5 MG tablet TAKE 1 TABLET BY MOUTH ONCE DAILY 90 tablet 2  . fluticasone (FLONASE) 50 MCG/ACT nasal spray Place 2 sprays daily into both nostrils. 16 g 12  . furosemide (LASIX) 40 MG tablet Take 40 mg by mouth daily.     .Marland Kitchenketoconazole (NIZORAL) 2 % shampoo Apply 1 application topically 2 (two) times a week. (Patient not taking: Reported on 10/19/2018) 120 mL 2  . lidocaine-prilocaine (EMLA) cream Apply to affected area once (Patient not taking: Reported on 10/19/2018) 30 g 3  . loratadine (CLARITIN) 10 MG tablet Take 1 tablet (10 mg total) by mouth daily. (Patient not taking: Reported on 10/12/2018) 90 tablet  3  . LORazepam (ATIVAN) 0.5 MG tablet Take 1 tablet (0.5 mg total) by mouth every 6 (six) hours as needed (Nausea or vomiting). (Patient not taking: Reported on 10/19/2018) 30 tablet 0  . Magnesium 400 MG TABS Take 400 mg by mouth daily.     . Multiple Vitamins-Minerals (CENTRUM ADULTS PO) Take 1 tablet by mouth daily.     .Marland Kitchenomeprazole (PRILOSEC) 20 MG capsule Take 1 capsule (20 mg total) by mouth daily. 90 capsule 4  . ondansetron (ZOFRAN) 8 MG tablet Take 1 tablet (8 mg total) by mouth 2 (two) times daily as needed for refractory nausea / vomiting. Start on day 3 after chemo. (Patient not taking: Reported on 10/19/2018) 30 tablet 1  . Polyethyl Glycol-Propyl Glycol (SYSTANE OP)  Place 1 drop into both eyes as needed (for dry eyes).    . potassium chloride SA (K-DUR,KLOR-CON) 20 MEQ tablet Take 1 tablet (20 mEq total) by mouth daily. 4 tablet 0  . prochlorperazine (COMPAZINE) 10 MG tablet Take 1 tablet (10 mg total) by mouth every 6 (six) hours as needed (Nausea or vomiting). (Patient not taking: Reported on 10/19/2018) 30 tablet 1  . tamsulosin (FLOMAX) 0.4 MG CAPS capsule Take 2 capsules (0.8 mg total) by mouth daily. (Patient not taking: Reported on 10/19/2018) 90 capsule 4  . tiotropium (SPIRIVA) 18 MCG inhalation capsule Place 1 capsule (18 mcg total) into inhaler and inhale daily. 90 capsule 4  . UNABLE TO FIND CBD Hemp topical cream    . warfarin (COUMADIN) 1 MG tablet TAKE 1 TABLET BY MOUTH ONCE DAILY 30 tablet 12  . warfarin (COUMADIN) 3 MG tablet TAKE 1 TABLET BY MOUTH ONCE DAILY. 30 tablet 2  . warfarin (COUMADIN) 4 MG tablet TAKE 1 TABLET BY MOUTH ONCE DAILY AT 6 P.M. (Patient not taking: Reported on 10/12/2018) 30 tablet 3   No current facility-administered medications for this visit.    Facility-Administered Medications Ordered in Other Visits  Medication Dose Route Frequency Provider Last Rate Last Dose  . 0.9 %  sodium chloride infusion   Intravenous Continuous Bruning, Kevin,  PA-C      . CARBOplatin (PARAPLATIN) 110 mg in sodium chloride 0.9 % 250 mL chemo infusion  110 mg Intravenous Once Sindy Guadeloupe, MD      . dexamethasone (DECADRON) 20 mg in sodium chloride 0.9 % 50 mL IVPB  20 mg Intravenous Once Sindy Guadeloupe, MD 208 mL/hr at 10/19/18 1143 20 mg at 10/19/18 1143  . heparin lock flush 100 unit/mL  500 Units Intravenous Once Sindy Guadeloupe, MD      . PACLitaxel (TAXOL) 84 mg in sodium chloride 0.9 % 250 mL chemo infusion (</= 65m/m2)  45 mg/m2 (Treatment Plan Recorded) Intravenous Once RSindy Guadeloupe MD      . sodium chloride flush (NS) 0.9 % injection 10 mL  10 mL Intravenous PRN RSindy Guadeloupe MD   10 mL at 10/19/18 0844    VITAL SIGNS: There were no vitals taken for this visit. There were no vitals filed for this visit.  Estimated body mass index is 25.18 kg/m as calculated from the following:   Height as of an earlier encounter on 10/19/18: 5' 7"  (1.702 m).   Weight as of an earlier encounter on 10/19/18: 160 lb 12.8 oz (72.9 kg).  LABS: CBC:    Component Value Date/Time   WBC 6.3 10/19/2018 0844   HGB 8.9 (L) 10/19/2018 0844   HGB 13.6 11/10/2017 1328   HCT 26.1 (L) 10/19/2018 0844   HCT 40.8 11/10/2017 1328   PLT 244 10/19/2018 0844   PLT 255 11/10/2017 1328   MCV 90.3 10/19/2018 0844   MCV 103 (H) 11/10/2017 1328   MCV 106 (H) 11/20/2014 0549   NEUTROABS 5.4 10/19/2018 0844   NEUTROABS 5.1 11/10/2017 1328   NEUTROABS 4.7 11/20/2014 0549   LYMPHSABS 0.2 (L) 10/19/2018 0844   LYMPHSABS 0.8 11/10/2017 1328   LYMPHSABS 1.0 11/20/2014 0549   MONOABS 0.7 10/19/2018 0844   MONOABS 1.0 11/20/2014 0549   EOSABS 0.0 10/19/2018 0844   EOSABS 0.1 11/10/2017 1328   EOSABS 0.2 11/20/2014 0549   BASOSABS 0.0 10/19/2018 0844   BASOSABS 0.1 11/10/2017 1328   BASOSABS 0.1 11/20/2014 0549   Comprehensive  Metabolic Panel:    Component Value Date/Time   NA 127 (L) 10/19/2018 0844   NA 136 05/08/2018 1507   NA 140 11/18/2014 0514   K 4.4  10/19/2018 0844   K 4.2 11/18/2014 0514   CL 89 (L) 10/19/2018 0844   CL 106 11/18/2014 0514   CO2 29 10/19/2018 0844   CO2 27 11/18/2014 0514   BUN 27 (H) 10/19/2018 0844   BUN 21 05/08/2018 1507   BUN 17 11/18/2014 0514   CREATININE 1.32 (H) 10/19/2018 0844   CREATININE 1.35 (H) 11/18/2014 0514   GLUCOSE 156 (H) 10/19/2018 0844   GLUCOSE 93 11/18/2014 0514   CALCIUM 8.6 (L) 10/19/2018 0844   CALCIUM 8.5 11/18/2014 0514   AST 31 10/19/2018 0844   AST 40 (H) 05/08/2018 1122   AST 68 (H) 11/16/2014 0031   ALT 17 10/19/2018 0844   ALT 23 05/08/2018 1122   ALT 102 (H) 11/16/2014 0031   ALKPHOS 93 10/19/2018 0844   ALKPHOS 614 (H) 11/16/2014 0031   BILITOT 1.0 10/19/2018 0844   BILITOT 0.5 10/24/2017 0910   BILITOT 0.6 11/16/2014 0031   PROT 6.1 (L) 10/19/2018 0844   PROT 6.7 10/24/2017 0910   PROT 7.2 11/16/2014 0031   ALBUMIN 2.9 (L) 10/19/2018 0844   ALBUMIN 4.4 10/24/2017 0910   ALBUMIN 3.1 (L) 11/16/2014 0031    RADIOGRAPHIC STUDIES: Ct Head Wo Contrast  Result Date: 10/12/2018 CLINICAL DATA:  Acute onset of dizziness and fall. Hit head on headboard. Laceration at the right temple. Patient on Coumadin. Initial encounter. EXAM: CT HEAD WITHOUT CONTRAST TECHNIQUE: Contiguous axial images were obtained from the base of the skull through the vertex without intravenous contrast. COMPARISON:  CT of the head performed 09/04/2018 FINDINGS: Brain: No evidence of acute infarction, hemorrhage, hydrocephalus, extra-axial collection or mass lesion / mass effect. Prominence of the ventricles and sulci reflects mild to moderate cortical volume loss. Cerebellar atrophy is noted. Scattered periventricular and subcortical white matter change likely reflects small vessel ischemic microangiopathy. A small chronic lacunar infarct is noted at the left cerebellar hemisphere. The brainstem and fourth ventricle are within normal limits. The basal ganglia are unremarkable in appearance. The cerebral  hemispheres demonstrate grossly normal gray-white differentiation. No mass effect or midline shift is seen. Vascular: No hyperdense vessel or unexpected calcification. Skull: There is no evidence of fracture; visualized osseous structures are unremarkable in appearance. Sinuses/Orbits: The visualized portions of the orbits are within normal limits. The paranasal sinuses and mastoid air cells are well-aerated. Other: A prominent soft tissue laceration is noted anterior to the right ear, with soft tissue air extending below the right ear. IMPRESSION: 1. No evidence of traumatic intracranial injury or fracture. 2. Prominent soft tissue laceration anterior to the right ear, with soft tissue air extending below the right ear. 3. Mild to moderate cortical volume loss and scattered small vessel ischemic microangiopathy. 4. Small chronic lacunar infarct at the left cerebellar hemisphere. Electronically Signed   By: Garald Balding M.D.   On: 10/12/2018 00:18   Dg Hand 2 View Left  Result Date: 10/12/2018 CLINICAL DATA:  Pt reports he had a fall last night and is now experiencing pain in his left hand. Pt hand appears swollen and red at this time EXAM: LEFT HAND - 2 VIEW COMPARISON:  None. FINDINGS: IV tubing overlies the wrist. There are degenerative changes insert wrist, primarily along the radial aspect of the carpals and the first carpometacarpal joint. There is degenerative change at  the second metacarpophalangeal joint. No acute fracture or subluxation. Atherosclerotic calcification of the small arteries. IMPRESSION: No evidence for acute  abnormality.  Degenerative changes. Electronically Signed   By: Nolon Nations M.D.   On: 10/12/2018 15:27   US Venous Img Upper Uni Left  Result Date: 10/15/2018 CLINICAL DATA:  Left arm swelling. Recent fall. Left cardiac ICD. Edema from the elbow to the wrist. EXAM: LEFT UPPER EXTREMITY VENOUS DOPPLER ULTRASOUND TECHNIQUE: Gray-scale sonography with graded compression,  as well as color Doppler and duplex ultrasound were performed to evaluate the upper extremity deep venous system from the level of the subclavian vein and including the jugular, axillary, basilic, radial, ulnar and upper cephalic vein. Spectral Doppler was utilized to evaluate flow at rest and with distal augmentation maneuvers. COMPARISON:  Chest radiograph 10/11/2018 FINDINGS: Contralateral Subclavian Vein: Normal respiratory phasicity. Normal color Doppler flow in the right subclavian vein. No evidence for right subclavian thrombus. Internal Jugular Vein: No evidence of thrombus. Normal compressibility, respiratory phasicity and response to augmentation. Subclavian Vein: The left cardiac ICD lead is noted. There appears to be flow around the lead and left subclavian vein appears to be patent without thrombus. Axillary Vein: No evidence of thrombus. Normal compressibility, respiratory phasicity and response to augmentation. Cephalic Vein: No evidence of thrombus. Normal compressibility, respiratory phasicity and response to augmentation. Basilic Vein: No evidence of thrombus. Normal compressibility, respiratory phasicity and response to augmentation. Brachial Veins: No evidence of thrombus. Normal compressibility, respiratory phasicity and response to augmentation. Radial Veins: No evidence of thrombus. Normal compressibility, respiratory phasicity and response to augmentation. Single radial vein is identified. Ulnar Veins: No evidence of thrombus. Normal compressibility, respiratory phasicity and response to augmentation. Single ulnar vein is identified. Venous Reflux:  None visualized. Other Findings:  Subcutaneous edema. IMPRESSION: No evidence of DVT within the left upper extremity. Limited evaluation of the left subclavian vein due to the cardiac ICD but no evidence for thrombus in this area. Electronically Signed   By: Markus Daft M.D.   On: 10/15/2018 14:58   Ct Abdomen W Wo Contrast  Result Date:  09/25/2018 CLINICAL DATA:  Evaluate pancreatic calcifications EXAM: CT ABDOMEN WITHOUT AND WITH CONTRAST TECHNIQUE: Multidetector CT imaging of the abdomen was performed following the standard protocol before and following the bolus administration of intravenous contrast. CONTRAST:  130m ISOVUE-300 IOPAMIDOL (ISOVUE-300) INJECTION 61% COMPARISON:  PET-CT 08/30/2018. FINDINGS: Lower chest: Large right lower lobe lung mass is again identified measuring 7.9 cm. Loculated pleural fluid is identified overlying the right midlung and right lower lobe. Small left pleural effusion noted. Aortic atherosclerosis is identified. Hepatobiliary: No focal liver abnormality. The gallbladder is not confidently identified and may be surgically absent. Pancreas: No pancreatic inflammation identified faint punctate areas of mild hyperattenuation within the head of pancreas are again identified within head of pancreas, image 39/2 and may represent small calcifications. 1.4 cm low-density structure within the head of pancreas is identified measuring 32 HU, image 96/14. This is favored to represent a mildly dilated common bile duct. This roughly corresponds to the areas of calcification within the head of pancreas which may reflect choledocholithiasis. Body and tail of pancreas appear normal. Spleen: Spleen normal. Adrenals/Urinary Tract: Unremarkable appearance of the adrenal glands. There is a cyst arising from inferior pole of right kidney measuring 3 cm, image 46/11. Smaller cysts noted within the upper pole of the right kidney. Multiple areas of cortical scarring involve the left kidney. Stomach/Bowel: Stomach and small bowel loops have a normal  course and caliber. Extensive colonic diverticulosis noted. Vascular/Lymphatic: Aortic atherosclerosis. No aneurysm. No adenopathy identified within the abdomen. Other: Trace free fluid identified along the pericolic gutters. Musculoskeletal: Mild thoraco lumbar scoliosis and multilevel  degenerative disc disease. IMPRESSION: 1. Faint calcifications within head of pancreas are again noted. There is cystic area of low attenuation within the head of pancreas which may represent a mildly dilated common bile duct. Calcifications may represent choledocholithiasis. In a patient who cannot have an MRI due to pacer device ERCP may be helpful to confirm common bile duct stones. 2. Large right lower lobe lung mass with surrounding loculated pleural fluid 3.  Aortic Atherosclerosis (ICD10-I70.0). Electronically Signed   By: Kerby Moors M.D.   On: 09/25/2018 16:27   Dg Chest Port 1 View  Result Date: 10/12/2018 CLINICAL DATA:  Status post fall, with concern for chest injury. Initial encounter. EXAM: PORTABLE CHEST 1 VIEW COMPARISON:  CT of the chest performed 08/21/2018 FINDINGS: There is a persistent small right-sided pleural effusion. The known right lower lobe mass is better characterized on prior CT. There is suggestion of underlying interstitial edema, which may be transient in nature. No pneumothorax is seen. The cardiomediastinal silhouette is borderline normal in size. The patient is status post median sternotomy. An AICD is noted overlying the left chest wall, with a single lead ending overlying the right ventricle. The patient's right chest port is noted ending about the mid SVC. No displaced rib fractures are seen. IMPRESSION: 1. Persistent small right-sided pleural effusion. Known right lower lobe mass is better characterized on prior CT. Suggestion of underlying interstitial edema, which may be transient in nature. 2. No displaced rib fracture seen. Electronically Signed   By: Garald Balding M.D.   On: 10/12/2018 00:22    PERFORMANCE STATUS (ECOG) : 3 - Symptomatic, >50% confined to bed  Review of Systems As noted above. Otherwise, a complete review of systems is negative.  Physical Exam General: NAD, frail appearing, thin Cardiovascular: irregular, tachycardic Pulmonary: clear  ant fields Abdomen: soft, nontender, + bowel sounds GU: no suprapubic tenderness Extremities: LUE edema, BLE edema Skin: no rashes Neurological: Weakness but otherwise nonfocal  IMPRESSION: Patient known to me from the hospital. I met today with patient and his daughter. He was receiving an infusion.   Patient says he feels he has been doing reasonably well since discharging from the hospital.  However, he remains frail and weak.  He is no longer able to ambulate without assistive device.  He has been using a walker in the home.  Patient denies falls since returning home.  He has home health ordered but they have yet to see him.  Patient thinks PT will start next week.  Patient does endorse an area of painful redness of the sacral area.  Likely reflects early stage pressure ulcer.  We discussed importance of offloading pressure and use of foam devices.  Home health nursing to evaluate.  Patient says his oral intake has been generally poor.  Weight down slightly over the past week.  Patient says his appetite is also complicated by his lactose intolerance.  We discussed the importance of high-protein, high-calorie meals.  Also recommended smaller more frequent meals throughout the day.  He is drinking Ensure once daily.  I recommended he increase this to twice daily.  We will also get him into see our dietitian.  Patient has increased edema since discharging from the hospital.  He has follow-up appointment with cardiology next week.  PLAN: Continue  treatment of cancer per oncology Recommended increased oral supplements Referral to see RD Home health for weakness F/u visit with cardiology next week Will see in clinic in 2 weeks   Patient expressed understanding and was in agreement with this plan. He also understands that He can call clinic at any time with any questions, concerns, or complaints.     Time Total: 30 minutes  Visit consisted of counseling and education dealing with the  complex and emotionally intense issues of symptom management and palliative care in the setting of serious and potentially life-threatening illness.Greater than 50%  of this time was spent counseling and coordinating care related to the above assessment and plan.  Signed by: Altha Harm, PhD, DNP, NP-C, [facility] [phone] (Work Cell)

## 2018-10-19 NOTE — Progress Notes (Signed)
Hematology/Oncology Consult note [facility]  [phone] [phone]  Patient Care Team: Guadalupe Maple, MD as PCP - General (Family Medicine) Murlean Iba, MD (Internal Medicine) Isaias Cowman, MD as Consulting Physician (Cardiology) Christene Lye, MD (General Surgery) Telford Nab, RN as Registered Nurse Knox Royalty, RN as [facility]   Name of the patient: Kirk Jones  [id number]  January 19, 1941   Date of visit: 10/19/18  Diagnosis- Squamous cell carcinoma of the lung Stage IIIA T3N1M0  Chief complaint/ Reason for visit-on treatment assessment prior to cycle 3 of weekly carbotaxol chemotherapy  Heme/Onc history: patient is a 77 year old Caucasian male with a past medical history significant for mechanical aortic valve replacement in 2004. He also has a history of chronic systolic congestive heart failure, ischemic cardiomyopathy, ventricular tachycardia status post ICD placement. He has a history of CVA in 2004 as well as hyperlipidemia and chronic kidney disease. He has had stent placements in the past and follows up with cardiology as well. He had an episode of ventricular tachycardia with ICD shock in August 2018. He has been a chronic smoker and states that he started smoking at the age of 57 quit smoking when he was 67. Patient presented to the ER with symptoms of worsening shortness of breath which led to a chest x-ray. Chest x-ray showed masslike density at the level of spine. This was followed by a CT chest without contrast. CT showed solid-appearing right lower lobe mass 6.6 x 5.6 cm in size extending towards the right hilum. Minimal mediastinal adenopathy with right infrahilar adenopathy was noted. Small nodular opacities throughout the right lung suspicious for metastases. Questionable single lesion noted on the left.  PET CT showed hypermetabolism in RLL abd possible  subcarinal adenopathy. Ascending thoracic aneurysm 4.8 cm in diameter. Subtle metabolic activity and calcifications in pancreatic head  Interval history-patient was discharged from the hospital few days ago for episode of supraventricular tachycardia.  At that time he was started on amiodarone.  He reports bilateral lower extremity swelling as well as left upper extremity swelling.  He did have an ultrasound of his left upper extremity during his hospital stay which was negative for DVT.  ECOG PS- 2 Pain scale- 0 Opioid associated constipation- no  Review of systems- Review of Systems  Constitutional: Positive for malaise/fatigue. Negative for chills, fever and weight loss.  HENT: Negative for congestion, ear discharge and nosebleeds.   Eyes: Negative for blurred vision.  Respiratory: Positive for shortness of breath. Negative for cough, hemoptysis, sputum production and wheezing.   Cardiovascular: Positive for leg swelling. Negative for chest pain, palpitations, orthopnea and claudication.  Gastrointestinal: Negative for abdominal pain, blood in stool, constipation, diarrhea, heartburn, melena, nausea and vomiting.  Genitourinary: Negative for dysuria, flank pain, frequency, hematuria and urgency.  Musculoskeletal: Negative for back pain, joint pain and myalgias.  Skin: Negative for rash.  Neurological: Negative for dizziness, tingling, focal weakness, seizures, weakness and headaches.  Endo/Heme/Allergies: Does not bruise/bleed easily.  Psychiatric/Behavioral: Negative for depression and suicidal ideas. The patient does not have insomnia.       Allergies  Allergen Reactions  . Prednisone Other (See Comments)    Reaction: "organs shutting down"     Past Medical History:  Diagnosis Date  . AICD (automatic cardioverter/defibrillator) present   . Aortic valve regurgitation   . Atrial fibrillation ([place])   . Barrett's esophagus   . Cancer ([place])    sate III lung  .  CHF (congestive  heart failure) ([place])   . COPD (chronic obstructive pulmonary disease) ([place])   . Diverticulosis   . Dysrhythmia   . GERD (gastroesophageal reflux disease)   . Gout   . Headache    aura only  . Heart murmur   . Hematuria   . Hernia of abdominal cavity   . History of prosthetic heart valve   . Hyperlipidemia   . Hypertension   . Nodular prostate with urinary obstruction   . Presence of permanent cardiac pacemaker   . Renal insufficiency   . Substance abuse ([place])    alcohol     Past Surgical History:  Procedure Laterality Date  . CARDIAC VALVE REPLACEMENT N/A 2004  . CARPAL TUNNEL RELEASE Right 2015  . CATARACT EXTRACTION Right 2017  . COLONOSCOPY WITH PROPOFOL N/A 2014  . CORONARY ANGIOPLASTY     stints x2  . EYE SURGERY     cross eyed  . FOOT SURGERY Bilateral    [place]  . FRACTURE SURGERY Right    [place]  . INGUINAL HERNIA REPAIR Right 09/16/2017   Procedure: HERNIA REPAIR INGUINAL ADULT;  Surgeon: Christene Lye, MD;  Location: [facility] ORS;  Service: General;  Laterality: Right;  . INSERT / REPLACE / [place]     ICD  . PACEMAKER IMPLANT Left 2013   and ICD  . PORTA CATH INSERTION N/A 09/21/2018   Procedure: PORTA CATH INSERTION;  Surgeon: Algernon Huxley, MD;  Location: [place] [facility];  Service: Cardiovascular;  Laterality: N/A;  . VASECTOMY      Social History   Socioeconomic History  . Marital status: Married    Spouse name: Belenda Cruise   . Number of children: 5  . Years of education: Not on file  . Highest education level: Not on file  Occupational History  . Not on file  Social Needs  . Financial resource strain: Not hard at all  . Food insecurity:    Worry: Never true    Inability: Never true  . Transportation needs:    Medical: No    Non-medical: No  Tobacco Use  . Smoking status: Former Smoker    Types: Cigarettes    Last attempt to quit: 04/24/1991    Years since quitting: 27.5  . Smokeless tobacco: Never Used  Substance and  Sexual Activity  . Alcohol use: Yes    Alcohol/week: 2.0 standard drinks    Types: 2 Shots of liquor per week    Comment: quit December 2015/restarted-2 drinks per night  . Drug use: No  . Sexual activity: Never  Lifestyle  . Physical activity:    Days per week: 0 days    Minutes per session: 0 min  . Stress: Not at all  Relationships  . Social connections:    Talks on phone: Once a week    Gets together: Once a week    Attends religious service: More than 4 times per year    Active member of club or organization: No    Attends meetings of clubs or organizations: Never    Relationship status: Married  . Intimate partner violence:    Fear of current or ex partner: No    Emotionally abused: No    Physically abused: No    Forced sexual activity: No  Other Topics Concern  . Not on file  Social History Narrative  . Not on file    Family History  Problem Relation Age of Onset  .  Heart attack Mother   . Diabetes Maternal Grandmother   . Lung cancer Sister      Current Outpatient Medications:  .  amiodarone (PACERONE) 200 MG tablet, Take 2 tablets (400 mg total) by mouth 2 (two) times daily., Disp: 120 tablet, Rfl: 0 .  aspirin EC 81 MG tablet, Take 81 mg by mouth daily., Disp: , Rfl:  .  COLCRYS 0.6 MG tablet, TAKE 1 TABLET BY MOUTH ONCE DAILY, Disp: 30 tablet, Rfl: 1 .  desonide (DESOWEN) 0.05 % cream, Apply topically 2 (two) times daily., Disp: 60 g, Rfl: 0 .  empagliflozin (JARDIANCE) 10 MG TABS tablet, Take 10 mg by mouth daily., Disp: , Rfl:  .  fexofenadine (ALLEGRA) 180 MG tablet, Take 180 mg by mouth daily., Disp: , Rfl:  .  finasteride (PROSCAR) 5 MG tablet, TAKE 1 TABLET BY MOUTH ONCE DAILY, Disp: 90 tablet, Rfl: 2 .  fluticasone (FLONASE) 50 MCG/ACT nasal spray, Place 2 sprays daily into both nostrils., Disp: 16 g, Rfl: 12 .  furosemide (LASIX) 40 MG tablet, Take 40 mg by mouth daily. , Disp: , Rfl:  .  Magnesium 400 MG TABS, Take 400 mg by mouth daily. , Disp: ,  Rfl:  .  Multiple Vitamins-Minerals (CENTRUM ADULTS PO), Take 1 tablet by mouth daily. , Disp: , Rfl:  .  omeprazole (PRILOSEC) 20 MG capsule, Take 1 capsule (20 mg total) by mouth daily., Disp: 90 capsule, Rfl: 4 .  potassium chloride SA (K-DUR,KLOR-CON) 20 MEQ tablet, Take 1 tablet (20 mEq total) by mouth daily., Disp: 4 tablet, Rfl: 0 .  tiotropium (SPIRIVA) 18 MCG inhalation capsule, Place 1 capsule (18 mcg total) into inhaler and inhale daily., Disp: 90 capsule, Rfl: 4 .  UNABLE TO FIND, CBD Hemp topical cream, Disp: , Rfl:  .  warfarin (COUMADIN) 1 MG tablet, TAKE 1 TABLET BY MOUTH ONCE DAILY, Disp: 30 tablet, Rfl: 12 .  warfarin (COUMADIN) 3 MG tablet, TAKE 1 TABLET BY MOUTH ONCE DAILY., Disp: 30 tablet, Rfl: 2 .  acetaminophen (TYLENOL) 650 MG CR tablet, Take 650 mg by mouth 2 (two) times daily., Disp: , Rfl:  .  dexamethasone (DECADRON) 4 MG tablet, Take 2 tablets (8 mg total) by mouth daily. Start the day after chemotherapy for 2 days. (Patient not taking: Reported on 10/05/2018), Disp: 30 tablet, Rfl: 1 .  diltiazem (CARDIZEM CD) 180 MG 24 hr capsule, , Disp: , Rfl: 3 .  ketoconazole (NIZORAL) 2 % shampoo, Apply 1 application topically 2 (two) times a week. (Patient not taking: Reported on 10/19/2018), Disp: 120 mL, Rfl: 2 .  lidocaine-prilocaine (EMLA) cream, Apply to affected area once (Patient not taking: Reported on 10/19/2018), Disp: 30 g, Rfl: 3 .  loratadine (CLARITIN) 10 MG tablet, Take 1 tablet (10 mg total) by mouth daily. (Patient not taking: Reported on 10/12/2018), Disp: 90 tablet, Rfl: 3 .  LORazepam (ATIVAN) 0.5 MG tablet, Take 1 tablet (0.5 mg total) by mouth every 6 (six) hours as needed (Nausea or vomiting). (Patient not taking: Reported on 10/19/2018), Disp: 30 tablet, Rfl: 0 .  ondansetron (ZOFRAN) 8 MG tablet, Take 1 tablet (8 mg total) by mouth 2 (two) times daily as needed for refractory nausea / vomiting. Start on day 3 after chemo. (Patient not taking: Reported on  10/19/2018), Disp: 30 tablet, Rfl: 1 .  Polyethyl Glycol-Propyl Glycol (SYSTANE OP), Place 1 drop into both eyes as needed (for dry eyes)., Disp: , Rfl:  .  prochlorperazine (COMPAZINE) 10  MG tablet, Take 1 tablet (10 mg total) by mouth every 6 (six) hours as needed (Nausea or vomiting). (Patient not taking: Reported on 10/19/2018), Disp: 30 tablet, Rfl: 1 .  tamsulosin (FLOMAX) 0.4 MG CAPS capsule, Take 2 capsules (0.8 mg total) by mouth daily. (Patient not taking: Reported on 10/19/2018), Disp: 90 capsule, Rfl: 4 .  warfarin (COUMADIN) 4 MG tablet, TAKE 1 TABLET BY MOUTH ONCE DAILY AT 6 P.M. (Patient not taking: Reported on 10/12/2018), Disp: 30 tablet, Rfl: 3 No current facility-administered medications for this visit.   Facility-Administered Medications Ordered in Other Visits:  .  0.9 %  sodium chloride infusion, , Intravenous, Continuous, Bruning, Kevin, PA-C .  sodium chloride flush (NS) 0.9 % injection 10 mL, 10 mL, Intravenous, PRN, Sindy Guadeloupe, MD, 10 mL at 10/19/18 0844  Physical exam:  Vitals:   10/19/18 0926  BP: 107/71  Pulse: (!) 128  Resp: 18  Temp: (!) 97.1 F (36.2 C)  TempSrc: Tympanic  SpO2: 94%  Weight: 160 lb 12.8 oz (72.9 kg)  Height: 5\' 7"  (1.702 m)   Physical Exam  Constitutional: He is oriented to person, place, and time.  Elderly gentleman who is sitting in a wheelchair.  He appears fatigued  HENT:  Head: Normocephalic and atraumatic.  Scalp sutures appear well-healed  Eyes: Pupils are equal, round, and reactive to light. EOM are normal.  Neck: Normal range of motion.  Cardiovascular: Normal heart sounds.  Tachycardic irregular  Pulmonary/Chest: Effort normal and breath sounds normal.  Abdominal: Soft. Bowel sounds are normal.  Musculoskeletal: He exhibits edema.  There is diffuse swelling of left upper extremity also noted  Neurological: He is alert and oriented to person, place, and time.  Skin: Skin is warm and dry.     CMP Latest Ref Rng &  Units 10/19/2018  Glucose 70 - 99 mg/dL 156(H)  BUN 8 - 23 mg/dL 27(H)  Creatinine 0.61 - 1.24 mg/dL 1.32(H)  Sodium 135 - 145 mmol/L 127(L)  Potassium 3.5 - 5.1 mmol/L 4.4  Chloride 98 - 111 mmol/L 89(L)  CO2 22 - 32 mmol/L 29  Calcium 8.9 - 10.3 mg/dL 8.6(L)  Total Protein 6.5 - 8.1 g/dL 6.1(L)  Total Bilirubin 0.3 - 1.2 mg/dL 1.0  Alkaline Phos 38 - 126 U/L 93  AST 15 - 41 U/L 31  ALT 0 - 44 U/L 17   CBC Latest Ref Rng & Units 10/19/2018  WBC 4.0 - 10.5 K/uL 6.3  Hemoglobin 13.0 - 17.0 g/dL 8.9(L)  Hematocrit 39.0 - 52.0 % 26.1(L)  Platelets 150 - 400 K/uL 244    No images are attached to the encounter.  Ct Head Wo Contrast  Result Date: 10/12/2018 CLINICAL DATA:  Acute onset of dizziness and fall. Hit head on headboard. Laceration at the right temple. Patient on Coumadin. Initial encounter. EXAM: CT HEAD WITHOUT CONTRAST TECHNIQUE: Contiguous axial images were obtained from the base of the skull through the vertex without intravenous contrast. COMPARISON:  CT of the head performed 09/04/2018 FINDINGS: Brain: No evidence of acute infarction, hemorrhage, hydrocephalus, extra-axial collection or mass lesion / mass effect. Prominence of the ventricles and sulci reflects mild to moderate cortical volume loss. Cerebellar atrophy is noted. Scattered periventricular and subcortical white matter change likely reflects small vessel ischemic microangiopathy. A small chronic lacunar infarct is noted at the left cerebellar hemisphere. The brainstem and fourth ventricle are within normal limits. The basal ganglia are unremarkable in appearance. The cerebral hemispheres demonstrate grossly normal gray-white  differentiation. No mass effect or midline shift is seen. Vascular: No hyperdense vessel or unexpected calcification. Skull: There is no evidence of fracture; visualized osseous structures are unremarkable in appearance. Sinuses/Orbits: The visualized portions of the orbits are within normal  limits. The paranasal sinuses and mastoid air cells are well-aerated. Other: A prominent soft tissue laceration is noted anterior to the right ear, with soft tissue air extending below the right ear. IMPRESSION: 1. No evidence of traumatic intracranial injury or fracture. 2. Prominent soft tissue laceration anterior to the right ear, with soft tissue air extending below the right ear. 3. Mild to moderate cortical volume loss and scattered small vessel ischemic microangiopathy. 4. Small chronic lacunar infarct at the left cerebellar hemisphere. Electronically Signed   By: Garald Balding M.D.   On: 10/12/2018 00:18   Dg Hand 2 View Left  Result Date: 10/12/2018 CLINICAL DATA:  Pt reports he had a fall last night and is now experiencing pain in his left hand. Pt hand appears swollen and red at this time EXAM: LEFT HAND - 2 VIEW COMPARISON:  None. FINDINGS: IV tubing overlies the wrist. There are degenerative changes insert wrist, primarily along the radial aspect of the carpals and the first carpometacarpal joint. There is degenerative change at the second metacarpophalangeal joint. No acute fracture or subluxation. Atherosclerotic calcification of the small arteries. IMPRESSION: No evidence for acute  abnormality.  Degenerative changes. Electronically Signed   By: Nolon Nations M.D.   On: 10/12/2018 15:27   US Venous Img Upper Uni Left  Result Date: 10/15/2018 CLINICAL DATA:  Left arm swelling. Recent fall. Left cardiac ICD. Edema from the elbow to the wrist. EXAM: LEFT UPPER EXTREMITY VENOUS DOPPLER ULTRASOUND TECHNIQUE: Gray-scale sonography with graded compression, as well as color Doppler and duplex ultrasound were performed to evaluate the upper extremity deep venous system from the level of the subclavian vein and including the jugular, axillary, basilic, radial, ulnar and upper cephalic vein. Spectral Doppler was utilized to evaluate flow at rest and with distal augmentation maneuvers. COMPARISON:   Chest radiograph 10/11/2018 FINDINGS: Contralateral Subclavian Vein: Normal respiratory phasicity. Normal color Doppler flow in the right subclavian vein. No evidence for right subclavian thrombus. Internal Jugular Vein: No evidence of thrombus. Normal compressibility, respiratory phasicity and response to augmentation. Subclavian Vein: The left cardiac ICD lead is noted. There appears to be flow around the lead and left subclavian vein appears to be patent without thrombus. Axillary Vein: No evidence of thrombus. Normal compressibility, respiratory phasicity and response to augmentation. Cephalic Vein: No evidence of thrombus. Normal compressibility, respiratory phasicity and response to augmentation. Basilic Vein: No evidence of thrombus. Normal compressibility, respiratory phasicity and response to augmentation. Brachial Veins: No evidence of thrombus. Normal compressibility, respiratory phasicity and response to augmentation. Radial Veins: No evidence of thrombus. Normal compressibility, respiratory phasicity and response to augmentation. Single radial vein is identified. Ulnar Veins: No evidence of thrombus. Normal compressibility, respiratory phasicity and response to augmentation. Single ulnar vein is identified. Venous Reflux:  None visualized. Other Findings:  Subcutaneous edema. IMPRESSION: No evidence of DVT within the left upper extremity. Limited evaluation of the left subclavian vein due to the cardiac ICD but no evidence for thrombus in this area. Electronically Signed   By: Markus Daft M.D.   On: 10/15/2018 14:58   Ct Abdomen W Wo Contrast  Result Date: 09/25/2018 CLINICAL DATA:  Evaluate pancreatic calcifications EXAM: CT ABDOMEN WITHOUT AND WITH CONTRAST TECHNIQUE: Multidetector CT imaging of the abdomen  was performed following the standard protocol before and following the bolus administration of intravenous contrast. CONTRAST:  169mL ISOVUE-300 IOPAMIDOL (ISOVUE-300) INJECTION 61% COMPARISON:   PET-CT 08/30/2018. FINDINGS: Lower chest: Large right lower lobe lung mass is again identified measuring 7.9 cm. Loculated pleural fluid is identified overlying the right midlung and right lower lobe. Small left pleural effusion noted. Aortic atherosclerosis is identified. Hepatobiliary: No focal liver abnormality. The gallbladder is not confidently identified and may be surgically absent. Pancreas: No pancreatic inflammation identified faint punctate areas of mild hyperattenuation within the head of pancreas are again identified within head of pancreas, image 39/2 and may represent small calcifications. 1.4 cm low-density structure within the head of pancreas is identified measuring 32 HU, image 96/14. This is favored to represent a mildly dilated common bile duct. This roughly corresponds to the areas of calcification within the head of pancreas which may reflect choledocholithiasis. Body and tail of pancreas appear normal. Spleen: Spleen normal. Adrenals/Urinary Tract: Unremarkable appearance of the adrenal glands. There is a cyst arising from inferior pole of right kidney measuring 3 cm, image 46/11. Smaller cysts noted within the upper pole of the right kidney. Multiple areas of cortical scarring involve the left kidney. Stomach/Bowel: Stomach and small bowel loops have a normal course and caliber. Extensive colonic diverticulosis noted. Vascular/Lymphatic: Aortic atherosclerosis. No aneurysm. No adenopathy identified within the abdomen. Other: Trace free fluid identified along the pericolic gutters. Musculoskeletal: Mild thoraco lumbar scoliosis and multilevel degenerative disc disease. IMPRESSION: 1. Faint calcifications within head of pancreas are again noted. There is cystic area of low attenuation within the head of pancreas which may represent a mildly dilated common bile duct. Calcifications may represent choledocholithiasis. In a patient who cannot have an MRI due to pacer device ERCP may be helpful to  confirm common bile duct stones. 2. Large right lower lobe lung mass with surrounding loculated pleural fluid 3.  Aortic Atherosclerosis (ICD10-I70.0). Electronically Signed   By: Kerby Moors M.D.   On: 09/25/2018 16:27   Dg Chest Port 1 View  Result Date: 10/12/2018 CLINICAL DATA:  Status post fall, with concern for chest injury. Initial encounter. EXAM: PORTABLE CHEST 1 VIEW COMPARISON:  CT of the chest performed 08/21/2018 FINDINGS: There is a persistent small right-sided pleural effusion. The known right lower lobe mass is better characterized on prior CT. There is suggestion of underlying interstitial edema, which may be transient in nature. No pneumothorax is seen. The cardiomediastinal silhouette is borderline normal in size. The patient is status post median sternotomy. An AICD is noted overlying the left chest wall, with a single lead ending overlying the right ventricle. The patient's right chest port is noted ending about the mid SVC. No displaced rib fractures are seen. IMPRESSION: 1. Persistent small right-sided pleural effusion. Known right lower lobe mass is better characterized on prior CT. Suggestion of underlying interstitial edema, which may be transient in nature. 2. No displaced rib fracture seen. Electronically Signed   By: Garald Balding M.D.   On: 10/12/2018 00:22     Assessment and plan- Patient is a 77 y.o. male withStage IIIA squamous cell carcinoma of the lung T3N1M0.  He is here for on treatment assessment of cycle 3 of weekly carbotaxol chemotherapy  1.  Patient was supposed to receive cycle 3 of chemotherapy last week but he did not get that secondary to his fall from supraventricular tachycardia which is now resolved.  He does have some A. fib and tachycardia today as well  but better since discharge.  He will therefore proceed with cycle 3 of weekly carbotaxol chemotherapy today.  He will not be receiving chemotherapy next week since his radiation will be completed next  day on a Wednesday on 10/25/2018.  2.  I spoke with Dr. Donella Stade today who states that he will be giving him a break from radiation treatment and decide about further radiation treatment may be after 2 to 3 weeks.  His chemo also will be on hold after today until he restarts his radiation down the line.  3.  Patient's creatinine is mildly raised at 1.3 today.  I will give him 500 cc of normal saline.  We will touch base with his nephrologist as well for with management especially given his renal function and hyponatremia as well as ongoing leg swelling.  Patient will also be seeing Dr. Saralyn Pilar early next week  4.  I will be checking his repeat BMP next week and if his kidney functions continue to worsen I will have a low threshold to stop his chemotherapy at that point.  I have lowered his carboplatin dose to AUC 1.5 today.  I will see him back in 2 weeks time with CBC and CMP to see how he is doing but will not receive chemotherapy on that day.  Upon completion of concurrent chemoradiation I will consider putting him on maintenance durvalumab   Visit Diagnosis 1. Squamous cell carcinoma of right lung ([place])   2. AKI (acute kidney injury) ([place])   3. Encounter for antineoplastic chemotherapy      Dr. Randa Evens, MD, MPH [facility] [phone] 10/19/2018 3:31 PM

## 2018-10-19 NOTE — Progress Notes (Signed)
Right arm swollen

## 2018-10-20 ENCOUNTER — Inpatient Hospital Stay
Admission: EM | Admit: 2018-10-20 | Discharge: 2018-10-30 | DRG: 441 | Disposition: A | Discharge status: Skilled Nursing Facility | Payer: Medicare Other | Attending: Internal Medicine | Admitting: Internal Medicine

## 2018-10-20 ENCOUNTER — Encounter: Payer: Self-pay | Admitting: *Deleted

## 2018-10-20 ENCOUNTER — Telehealth: Payer: Self-pay | Admitting: Family Medicine

## 2018-10-20 ENCOUNTER — Other Ambulatory Visit: Payer: Self-pay | Admitting: *Deleted

## 2018-10-20 ENCOUNTER — Ambulatory Visit
Admission: RE | Admit: 2018-10-20 | Discharge: 2018-10-20 | Disposition: A | Discharge status: Home / Self Care | Payer: Medicare Other | Source: Ambulatory Visit | Attending: Radiation Oncology | Admitting: Radiation Oncology

## 2018-10-20 DIAGNOSIS — T45515A Adverse effect of anticoagulants, initial encounter: Secondary | ICD-10-CM | POA: Diagnosis present

## 2018-10-20 DIAGNOSIS — Z833 Family history of diabetes mellitus: Secondary | ICD-10-CM

## 2018-10-20 DIAGNOSIS — Z7982 Long term (current) use of aspirin: Secondary | ICD-10-CM

## 2018-10-20 DIAGNOSIS — Z8673 Personal history of transient ischemic attack (TIA), and cerebral infarction without residual deficits: Secondary | ICD-10-CM

## 2018-10-20 DIAGNOSIS — B159 Hepatitis A without hepatic coma: Secondary | ICD-10-CM | POA: Diagnosis present

## 2018-10-20 DIAGNOSIS — I482 Chronic atrial fibrillation, unspecified: Secondary | ICD-10-CM | POA: Diagnosis present

## 2018-10-20 DIAGNOSIS — R7401 Elevation of levels of liver transaminase levels: Secondary | ICD-10-CM | POA: Diagnosis present

## 2018-10-20 DIAGNOSIS — R0602 Shortness of breath: Secondary | ICD-10-CM | POA: Diagnosis not present

## 2018-10-20 DIAGNOSIS — C3491 Malignant neoplasm of unspecified part of right bronchus or lung: Secondary | ICD-10-CM | POA: Diagnosis not present

## 2018-10-20 DIAGNOSIS — C3431 Malignant neoplasm of lower lobe, right bronchus or lung: Secondary | ICD-10-CM | POA: Diagnosis not present

## 2018-10-20 DIAGNOSIS — L89150 Pressure ulcer of sacral region, unstageable: Secondary | ICD-10-CM

## 2018-10-20 DIAGNOSIS — T451X5A Adverse effect of antineoplastic and immunosuppressive drugs, initial encounter: Secondary | ICD-10-CM

## 2018-10-20 DIAGNOSIS — K761 Chronic passive congestion of liver: Secondary | ICD-10-CM | POA: Diagnosis present

## 2018-10-20 DIAGNOSIS — D709 Neutropenia, unspecified: Secondary | ICD-10-CM | POA: Diagnosis present

## 2018-10-20 DIAGNOSIS — E878 Other disorders of electrolyte and fluid balance, not elsewhere classified: Secondary | ICD-10-CM | POA: Diagnosis present

## 2018-10-20 DIAGNOSIS — R791 Abnormal coagulation profile: Secondary | ICD-10-CM

## 2018-10-20 DIAGNOSIS — W449XXA Unspecified foreign body entering into or through a natural orifice, initial encounter: Secondary | ICD-10-CM

## 2018-10-20 DIAGNOSIS — R2689 Other abnormalities of gait and mobility: Secondary | ICD-10-CM | POA: Diagnosis not present

## 2018-10-20 DIAGNOSIS — R531 Weakness: Secondary | ICD-10-CM | POA: Diagnosis not present

## 2018-10-20 DIAGNOSIS — K7200 Acute and subacute hepatic failure without coma: Secondary | ICD-10-CM | POA: Diagnosis not present

## 2018-10-20 DIAGNOSIS — I5022 Chronic systolic (congestive) heart failure: Secondary | ICD-10-CM | POA: Diagnosis not present

## 2018-10-20 DIAGNOSIS — I454 Nonspecific intraventricular block: Secondary | ICD-10-CM | POA: Diagnosis present

## 2018-10-20 DIAGNOSIS — R740 Nonspecific elevation of levels of transaminase and lactic acid dehydrogenase [LDH]: Secondary | ICD-10-CM

## 2018-10-20 DIAGNOSIS — Z955 Presence of coronary angioplasty implant and graft: Secondary | ICD-10-CM

## 2018-10-20 DIAGNOSIS — I255 Ischemic cardiomyopathy: Secondary | ICD-10-CM | POA: Diagnosis present

## 2018-10-20 DIAGNOSIS — R269 Unspecified abnormalities of gait and mobility: Secondary | ICD-10-CM | POA: Diagnosis present

## 2018-10-20 DIAGNOSIS — N183 Chronic kidney disease, stage 3 (moderate): Secondary | ICD-10-CM | POA: Diagnosis present

## 2018-10-20 DIAGNOSIS — J449 Chronic obstructive pulmonary disease, unspecified: Secondary | ICD-10-CM | POA: Diagnosis not present

## 2018-10-20 DIAGNOSIS — E861 Hypovolemia: Secondary | ICD-10-CM | POA: Diagnosis present

## 2018-10-20 DIAGNOSIS — E875 Hyperkalemia: Secondary | ICD-10-CM

## 2018-10-20 DIAGNOSIS — I2510 Atherosclerotic heart disease of native coronary artery without angina pectoris: Secondary | ICD-10-CM | POA: Diagnosis present

## 2018-10-20 DIAGNOSIS — N179 Acute kidney failure, unspecified: Secondary | ICD-10-CM | POA: Diagnosis not present

## 2018-10-20 DIAGNOSIS — D631 Anemia in chronic kidney disease: Secondary | ICD-10-CM | POA: Diagnosis present

## 2018-10-20 DIAGNOSIS — E8809 Other disorders of plasma-protein metabolism, not elsewhere classified: Secondary | ICD-10-CM | POA: Diagnosis present

## 2018-10-20 DIAGNOSIS — R001 Bradycardia, unspecified: Secondary | ICD-10-CM | POA: Diagnosis present

## 2018-10-20 DIAGNOSIS — E1122 Type 2 diabetes mellitus with diabetic chronic kidney disease: Secondary | ICD-10-CM | POA: Diagnosis present

## 2018-10-20 DIAGNOSIS — D696 Thrombocytopenia, unspecified: Secondary | ICD-10-CM

## 2018-10-20 DIAGNOSIS — D689 Coagulation defect, unspecified: Secondary | ICD-10-CM | POA: Diagnosis present

## 2018-10-20 DIAGNOSIS — E872 Acidosis: Secondary | ICD-10-CM | POA: Diagnosis present

## 2018-10-20 DIAGNOSIS — Z9852 Vasectomy status: Secondary | ICD-10-CM

## 2018-10-20 DIAGNOSIS — I130 Hypertensive heart and chronic kidney disease with heart failure and stage 1 through stage 4 chronic kidney disease, or unspecified chronic kidney disease: Secondary | ICD-10-CM | POA: Diagnosis present

## 2018-10-20 DIAGNOSIS — Z95810 Presence of automatic (implantable) cardiac defibrillator: Secondary | ICD-10-CM

## 2018-10-20 DIAGNOSIS — T17908A Unspecified foreign body in respiratory tract, part unspecified causing other injury, initial encounter: Secondary | ICD-10-CM

## 2018-10-20 DIAGNOSIS — I110 Hypertensive heart disease with heart failure: Secondary | ICD-10-CM | POA: Diagnosis not present

## 2018-10-20 DIAGNOSIS — Z87891 Personal history of nicotine dependence: Secondary | ICD-10-CM

## 2018-10-20 DIAGNOSIS — M6281 Muscle weakness (generalized): Secondary | ICD-10-CM | POA: Diagnosis not present

## 2018-10-20 DIAGNOSIS — I252 Old myocardial infarction: Secondary | ICD-10-CM

## 2018-10-20 DIAGNOSIS — I480 Paroxysmal atrial fibrillation: Secondary | ICD-10-CM | POA: Diagnosis present

## 2018-10-20 DIAGNOSIS — I712 Thoracic aortic aneurysm, without rupture: Secondary | ICD-10-CM | POA: Diagnosis present

## 2018-10-20 DIAGNOSIS — Z79899 Other long term (current) drug therapy: Secondary | ICD-10-CM

## 2018-10-20 DIAGNOSIS — Z9981 Dependence on supplemental oxygen: Secondary | ICD-10-CM

## 2018-10-20 DIAGNOSIS — J9621 Acute and chronic respiratory failure with hypoxia: Secondary | ICD-10-CM | POA: Diagnosis present

## 2018-10-20 DIAGNOSIS — Z7901 Long term (current) use of anticoagulants: Secondary | ICD-10-CM

## 2018-10-20 DIAGNOSIS — K219 Gastro-esophageal reflux disease without esophagitis: Secondary | ICD-10-CM | POA: Diagnosis present

## 2018-10-20 DIAGNOSIS — Z888 Allergy status to other drugs, medicaments and biological substances status: Secondary | ICD-10-CM

## 2018-10-20 DIAGNOSIS — Z952 Presence of prosthetic heart valve: Secondary | ICD-10-CM

## 2018-10-20 DIAGNOSIS — Z801 Family history of malignant neoplasm of trachea, bronchus and lung: Secondary | ICD-10-CM

## 2018-10-20 DIAGNOSIS — Z86010 Personal history of colonic polyps: Secondary | ICD-10-CM

## 2018-10-20 DIAGNOSIS — I5082 Biventricular heart failure: Secondary | ICD-10-CM | POA: Diagnosis present

## 2018-10-20 DIAGNOSIS — Z8249 Family history of ischemic heart disease and other diseases of the circulatory system: Secondary | ICD-10-CM

## 2018-10-20 DIAGNOSIS — K2270 Barrett's esophagus without dysplasia: Secondary | ICD-10-CM | POA: Diagnosis present

## 2018-10-20 DIAGNOSIS — K7589 Other specified inflammatory liver diseases: Secondary | ICD-10-CM | POA: Diagnosis present

## 2018-10-20 DIAGNOSIS — Z66 Do not resuscitate: Secondary | ICD-10-CM | POA: Diagnosis present

## 2018-10-20 DIAGNOSIS — M109 Gout, unspecified: Secondary | ICD-10-CM | POA: Diagnosis present

## 2018-10-20 DIAGNOSIS — E871 Hypo-osmolality and hyponatremia: Secondary | ICD-10-CM | POA: Diagnosis not present

## 2018-10-20 DIAGNOSIS — X58XXXA Exposure to other specified factors, initial encounter: Secondary | ICD-10-CM | POA: Diagnosis present

## 2018-10-20 DIAGNOSIS — R011 Cardiac murmur, unspecified: Secondary | ICD-10-CM | POA: Diagnosis present

## 2018-10-20 DIAGNOSIS — D701 Agranulocytosis secondary to cancer chemotherapy: Secondary | ICD-10-CM

## 2018-10-20 DIAGNOSIS — Z9841 Cataract extraction status, right eye: Secondary | ICD-10-CM

## 2018-10-20 DIAGNOSIS — N400 Enlarged prostate without lower urinary tract symptoms: Secondary | ICD-10-CM | POA: Diagnosis present

## 2018-10-20 DIAGNOSIS — I509 Heart failure, unspecified: Secondary | ICD-10-CM

## 2018-10-20 NOTE — Patient Outreach (Signed)
[name] [place] Telephone Outreach PCP completes Transition of Care follow up post-hospital discharge Post-hospital discharge day # 5  10/20/2018  Kirk Jones [facility] Jan 11, 1941 [id number]  Successful telephone outreach attempt to spouse/ caregiver of Kirk Jones, 77 y/o male referred to [facility] by inpatient RN CM after recent hospitalization November 14-17, 2019 for syncope/ mechanical fall with head injury- laceration requiring 16 sutures.  Patient was discharged home from hospital to self-care with home health services in place through [place].  Patient has history including, but not limited to, A-Fib; HTN/ HLD; COPD, on home O2 as baseline at 2 L/min via [place]; CKD- stage III; CAD with previous aortic valve replacement, pacemaker, and ICD; (s) CHF; previous CVA; and lung cancer.  HIPAA/ identity verified with patient's spouse, who asks that I call back at another time, as patient is currently working with home health PT, has a home health nurse visit scheduled for later today, as well as his radiation treatment this afternoon; verbally contracted with patient's spouse that I would re-attempt call to patient early in week next week, as she has requested today.  Plan:  Will re-attempt [name] telephone outreach again next week, as requested by patient's spouse today  Oneta Rack, RN, BSN, [employer] Coordinator [facility] Care Management  [phone]

## 2018-10-20 NOTE — Telephone Encounter (Signed)
Called and let Varney Biles know that Dr. Wynetta Emery gave the ok for verbal orders.

## 2018-10-20 NOTE — Telephone Encounter (Signed)
Copied from [place] [phone]. Topic: Quick Communication - Home Health Verbal Orders >> Oct 20, 2018  3:50 PM R[place]ford Nail, [place] wrote: Caller/Agency: Varney Biles, RN with [place] Number: [phone]  Varney Biles calling and states that the patient had a fall last night (10/19/18) and EMS came out to assess him. States that she did a dressing change today (10/20/18). States that he has a skin tear to his posterior left hand that she applied dressing to. Also states that he has a stage 2 pressure ulser on his right buttocks that she applied hydrogel and an allebyn pad. Would like to know if she could get verbal orders for supplies to be sent to the patient's home? Please advise.

## 2018-10-20 NOTE — Telephone Encounter (Signed)
Yes please. Give the verbals

## 2018-10-21 ENCOUNTER — Emergency Department: Payer: Medicare Other

## 2018-10-21 ENCOUNTER — Inpatient Hospital Stay: Payer: Medicare Other

## 2018-10-21 ENCOUNTER — Other Ambulatory Visit: Payer: Self-pay

## 2018-10-21 DIAGNOSIS — I5032 Chronic diastolic (congestive) heart failure: Secondary | ICD-10-CM | POA: Diagnosis not present

## 2018-10-21 DIAGNOSIS — D709 Neutropenia, unspecified: Secondary | ICD-10-CM | POA: Diagnosis present

## 2018-10-21 DIAGNOSIS — E785 Hyperlipidemia, unspecified: Secondary | ICD-10-CM | POA: Diagnosis not present

## 2018-10-21 DIAGNOSIS — N179 Acute kidney failure, unspecified: Secondary | ICD-10-CM | POA: Diagnosis present

## 2018-10-21 DIAGNOSIS — I517 Cardiomegaly: Secondary | ICD-10-CM | POA: Diagnosis not present

## 2018-10-21 DIAGNOSIS — R531 Weakness: Secondary | ICD-10-CM | POA: Diagnosis not present

## 2018-10-21 DIAGNOSIS — C3491 Malignant neoplasm of unspecified part of right bronchus or lung: Secondary | ICD-10-CM | POA: Diagnosis present

## 2018-10-21 DIAGNOSIS — Z95810 Presence of automatic (implantable) cardiac defibrillator: Secondary | ICD-10-CM | POA: Diagnosis not present

## 2018-10-21 DIAGNOSIS — R748 Abnormal levels of other serum enzymes: Secondary | ICD-10-CM | POA: Diagnosis not present

## 2018-10-21 DIAGNOSIS — R791 Abnormal coagulation profile: Secondary | ICD-10-CM | POA: Diagnosis not present

## 2018-10-21 DIAGNOSIS — Z515 Encounter for palliative care: Secondary | ICD-10-CM | POA: Diagnosis not present

## 2018-10-21 DIAGNOSIS — E871 Hypo-osmolality and hyponatremia: Secondary | ICD-10-CM | POA: Diagnosis present

## 2018-10-21 DIAGNOSIS — I129 Hypertensive chronic kidney disease with stage 1 through stage 4 chronic kidney disease, or unspecified chronic kidney disease: Secondary | ICD-10-CM | POA: Diagnosis not present

## 2018-10-21 DIAGNOSIS — Z9981 Dependence on supplemental oxygen: Secondary | ICD-10-CM | POA: Diagnosis not present

## 2018-10-21 DIAGNOSIS — Z7901 Long term (current) use of anticoagulants: Secondary | ICD-10-CM | POA: Diagnosis not present

## 2018-10-21 DIAGNOSIS — J90 Pleural effusion, not elsewhere classified: Secondary | ICD-10-CM | POA: Diagnosis not present

## 2018-10-21 DIAGNOSIS — I480 Paroxysmal atrial fibrillation: Secondary | ICD-10-CM | POA: Diagnosis present

## 2018-10-21 DIAGNOSIS — K7290 Hepatic failure, unspecified without coma: Secondary | ICD-10-CM | POA: Diagnosis not present

## 2018-10-21 DIAGNOSIS — I509 Heart failure, unspecified: Secondary | ICD-10-CM | POA: Diagnosis not present

## 2018-10-21 DIAGNOSIS — E872 Acidosis: Secondary | ICD-10-CM | POA: Diagnosis present

## 2018-10-21 DIAGNOSIS — D696 Thrombocytopenia, unspecified: Secondary | ICD-10-CM | POA: Diagnosis not present

## 2018-10-21 DIAGNOSIS — Z87891 Personal history of nicotine dependence: Secondary | ICD-10-CM | POA: Diagnosis not present

## 2018-10-21 DIAGNOSIS — R001 Bradycardia, unspecified: Secondary | ICD-10-CM | POA: Diagnosis present

## 2018-10-21 DIAGNOSIS — I5020 Unspecified systolic (congestive) heart failure: Secondary | ICD-10-CM | POA: Diagnosis not present

## 2018-10-21 DIAGNOSIS — Z7401 Bed confinement status: Secondary | ICD-10-CM | POA: Diagnosis not present

## 2018-10-21 DIAGNOSIS — R41841 Cognitive communication deficit: Secondary | ICD-10-CM | POA: Diagnosis not present

## 2018-10-21 DIAGNOSIS — R188 Other ascites: Secondary | ICD-10-CM | POA: Diagnosis not present

## 2018-10-21 DIAGNOSIS — J9621 Acute and chronic respiratory failure with hypoxia: Secondary | ICD-10-CM | POA: Diagnosis present

## 2018-10-21 DIAGNOSIS — D701 Agranulocytosis secondary to cancer chemotherapy: Secondary | ICD-10-CM | POA: Diagnosis not present

## 2018-10-21 DIAGNOSIS — E569 Vitamin deficiency, unspecified: Secondary | ICD-10-CM | POA: Diagnosis not present

## 2018-10-21 DIAGNOSIS — K7200 Acute and subacute hepatic failure without coma: Secondary | ICD-10-CM | POA: Diagnosis present

## 2018-10-21 DIAGNOSIS — R600 Localized edema: Secondary | ICD-10-CM | POA: Diagnosis not present

## 2018-10-21 DIAGNOSIS — T451X5A Adverse effect of antineoplastic and immunosuppressive drugs, initial encounter: Secondary | ICD-10-CM | POA: Diagnosis not present

## 2018-10-21 DIAGNOSIS — E1122 Type 2 diabetes mellitus with diabetic chronic kidney disease: Secondary | ICD-10-CM | POA: Diagnosis present

## 2018-10-21 DIAGNOSIS — R7401 Elevation of levels of liver transaminase levels: Secondary | ICD-10-CM | POA: Diagnosis present

## 2018-10-21 DIAGNOSIS — R0602 Shortness of breath: Secondary | ICD-10-CM | POA: Diagnosis present

## 2018-10-21 DIAGNOSIS — R740 Nonspecific elevation of levels of transaminase and lactic acid dehydrogenase [LDH]: Secondary | ICD-10-CM | POA: Diagnosis not present

## 2018-10-21 DIAGNOSIS — R498 Other voice and resonance disorders: Secondary | ICD-10-CM | POA: Diagnosis not present

## 2018-10-21 DIAGNOSIS — M6281 Muscle weakness (generalized): Secondary | ICD-10-CM | POA: Diagnosis not present

## 2018-10-21 DIAGNOSIS — R945 Abnormal results of liver function studies: Secondary | ICD-10-CM

## 2018-10-21 DIAGNOSIS — I959 Hypotension, unspecified: Secondary | ICD-10-CM | POA: Diagnosis not present

## 2018-10-21 DIAGNOSIS — D291 Benign neoplasm of prostate: Secondary | ICD-10-CM | POA: Diagnosis not present

## 2018-10-21 DIAGNOSIS — J440 Chronic obstructive pulmonary disease with acute lower respiratory infection: Secondary | ICD-10-CM | POA: Diagnosis not present

## 2018-10-21 DIAGNOSIS — I2510 Atherosclerotic heart disease of native coronary artery without angina pectoris: Secondary | ICD-10-CM | POA: Diagnosis present

## 2018-10-21 DIAGNOSIS — B159 Hepatitis A without hepatic coma: Secondary | ICD-10-CM | POA: Diagnosis present

## 2018-10-21 DIAGNOSIS — J449 Chronic obstructive pulmonary disease, unspecified: Secondary | ICD-10-CM | POA: Diagnosis present

## 2018-10-21 DIAGNOSIS — I10 Essential (primary) hypertension: Secondary | ICD-10-CM | POA: Diagnosis not present

## 2018-10-21 DIAGNOSIS — E875 Hyperkalemia: Secondary | ICD-10-CM

## 2018-10-21 DIAGNOSIS — I5022 Chronic systolic (congestive) heart failure: Secondary | ICD-10-CM | POA: Diagnosis present

## 2018-10-21 DIAGNOSIS — M2550 Pain in unspecified joint: Secondary | ICD-10-CM | POA: Diagnosis not present

## 2018-10-21 DIAGNOSIS — I482 Chronic atrial fibrillation, unspecified: Secondary | ICD-10-CM | POA: Diagnosis present

## 2018-10-21 DIAGNOSIS — K219 Gastro-esophageal reflux disease without esophagitis: Secondary | ICD-10-CM | POA: Diagnosis not present

## 2018-10-21 DIAGNOSIS — D631 Anemia in chronic kidney disease: Secondary | ICD-10-CM | POA: Diagnosis not present

## 2018-10-21 DIAGNOSIS — I130 Hypertensive heart and chronic kidney disease with heart failure and stage 1 through stage 4 chronic kidney disease, or unspecified chronic kidney disease: Secondary | ICD-10-CM | POA: Diagnosis present

## 2018-10-21 DIAGNOSIS — I4891 Unspecified atrial fibrillation: Secondary | ICD-10-CM | POA: Diagnosis not present

## 2018-10-21 DIAGNOSIS — K769 Liver disease, unspecified: Secondary | ICD-10-CM | POA: Diagnosis not present

## 2018-10-21 DIAGNOSIS — I5023 Acute on chronic systolic (congestive) heart failure: Secondary | ICD-10-CM | POA: Diagnosis not present

## 2018-10-21 DIAGNOSIS — D689 Coagulation defect, unspecified: Secondary | ICD-10-CM | POA: Diagnosis present

## 2018-10-21 DIAGNOSIS — X58XXXA Exposure to other specified factors, initial encounter: Secondary | ICD-10-CM | POA: Diagnosis present

## 2018-10-21 DIAGNOSIS — C3490 Malignant neoplasm of unspecified part of unspecified bronchus or lung: Secondary | ICD-10-CM | POA: Diagnosis not present

## 2018-10-21 DIAGNOSIS — N183 Chronic kidney disease, stage 3 (moderate): Secondary | ICD-10-CM | POA: Diagnosis present

## 2018-10-21 DIAGNOSIS — K761 Chronic passive congestion of liver: Secondary | ICD-10-CM | POA: Diagnosis present

## 2018-10-21 DIAGNOSIS — I5082 Biventricular heart failure: Secondary | ICD-10-CM | POA: Diagnosis present

## 2018-10-21 DIAGNOSIS — K7589 Other specified inflammatory liver diseases: Secondary | ICD-10-CM | POA: Diagnosis present

## 2018-10-21 DIAGNOSIS — I350 Nonrheumatic aortic (valve) stenosis: Secondary | ICD-10-CM | POA: Diagnosis not present

## 2018-10-21 LAB — MAGNESIUM
Magnesium: 1.7 mg/dL (ref 1.7–2.4)
Magnesium: 1.7 mg/dL (ref 1.7–2.4)
Magnesium: 1.8 mg/dL (ref 1.7–2.4)

## 2018-10-21 LAB — CBC WITH DIFFERENTIAL/PLATELET
ABS IMMATURE GRANULOCYTES: 0.08 10*3/uL — AB (ref 0.00–0.07)
ABS IMMATURE GRANULOCYTES: 0.11 10*3/uL — AB (ref 0.00–0.07)
ABS IMMATURE GRANULOCYTES: 0.13 10*3/uL — AB (ref 0.00–0.07)
Abs Immature Granulocytes: 0.09 10*3/uL — ABNORMAL HIGH (ref 0.00–0.07)
BASOS ABS: 0 10*3/uL (ref 0.0–0.1)
BASOS ABS: 0 10*3/uL (ref 0.0–0.1)
BASOS ABS: 0 10*3/uL (ref 0.0–0.1)
BASOS PCT: 0 %
BASOS PCT: 0 %
Basophils Absolute: 0 10*3/uL (ref 0.0–0.1)
Basophils Relative: 0 %
Basophils Relative: 0 %
EOS ABS: 0 10*3/uL (ref 0.0–0.5)
EOS ABS: 0 10*3/uL (ref 0.0–0.5)
EOS PCT: 0 %
Eosinophils Absolute: 0 10*3/uL (ref 0.0–0.5)
Eosinophils Absolute: 0 10*3/uL (ref 0.0–0.5)
Eosinophils Relative: 0 %
Eosinophils Relative: 0 %
Eosinophils Relative: 0 %
HCT: 29 % — ABNORMAL LOW (ref 39.0–52.0)
HCT: 26 % — ABNORMAL LOW (ref 39.0–52.0)
HEMATOCRIT: 27.7 % — AB (ref 39.0–52.0)
HEMATOCRIT: 25.9 % — AB (ref 39.0–52.0)
HEMOGLOBIN: 9 g/dL — AB (ref 13.0–17.0)
HEMOGLOBIN: 8.6 g/dL — AB (ref 13.0–17.0)
Hemoglobin: 9.4 g/dL — ABNORMAL LOW (ref 13.0–17.0)
Hemoglobin: 8.4 g/dL — ABNORMAL LOW (ref 13.0–17.0)
IMMATURE GRANULOCYTES: 1 %
IMMATURE GRANULOCYTES: 1 %
Immature Granulocytes: 1 %
Immature Granulocytes: 1 %
LYMPHS ABS: 0.1 10*3/uL — AB (ref 0.7–4.0)
LYMPHS ABS: 0 10*3/uL — AB (ref 0.7–4.0)
LYMPHS PCT: 1 %
LYMPHS PCT: 1 %
Lymphocytes Relative: 1 %
Lymphocytes Relative: 0 %
Lymphs Abs: 0.1 10*3/uL — ABNORMAL LOW (ref 0.7–4.0)
Lymphs Abs: 0.1 10*3/uL — ABNORMAL LOW (ref 0.7–4.0)
MCH: 31.2 pg (ref 26.0–34.0)
MCH: 31.1 pg (ref 26.0–34.0)
MCH: 30.9 pg (ref 26.0–34.0)
MCH: 31 pg (ref 26.0–34.0)
MCHC: 32.4 g/dL (ref 30.0–36.0)
MCHC: 32.5 g/dL (ref 30.0–36.0)
MCHC: 32.3 g/dL (ref 30.0–36.0)
MCHC: 33.2 g/dL (ref 30.0–36.0)
MCV: 96.3 fL (ref 80.0–100.0)
MCV: 95.8 fL (ref 80.0–100.0)
MCV: 95.9 fL (ref 80.0–100.0)
MCV: 93.2 fL (ref 80.0–100.0)
MONO ABS: 0.2 10*3/uL (ref 0.1–1.0)
MONO ABS: 0.2 10*3/uL (ref 0.1–1.0)
MONOS PCT: 2 %
MONOS PCT: 1 %
Monocytes Absolute: 0.1 10*3/uL (ref 0.1–1.0)
Monocytes Absolute: 0 10*3/uL — ABNORMAL LOW (ref 0.1–1.0)
Monocytes Relative: 2 %
Monocytes Relative: 0 %
NEUTROS ABS: 10.1 10*3/uL — AB (ref 1.7–7.7)
NEUTROS ABS: 9.4 10*3/uL — AB (ref 1.7–7.7)
NEUTROS PCT: 96 %
NEUTROS PCT: 96 %
NEUTROS PCT: 97 %
NEUTROS PCT: 99 %
NRBC: 1.7 % — AB (ref 0.0–0.2)
NRBC: 2.3 % — AB (ref 0.0–0.2)
NRBC: 3 % — AB (ref 0.0–0.2)
Neutro Abs: 10.5 10*3/uL — ABNORMAL HIGH (ref 1.7–7.7)
Neutro Abs: 9.7 10*3/uL — ABNORMAL HIGH (ref 1.7–7.7)
PLATELETS: 119 10*3/uL — AB (ref 150–400)
PLATELETS: 150 10*3/uL (ref 150–400)
Platelet Morphology: NORMAL
Platelets: 135 10*3/uL — ABNORMAL LOW (ref 150–400)
Platelets: 119 10*3/uL — ABNORMAL LOW (ref 150–400)
RBC: 2.71 MIL/uL — ABNORMAL LOW (ref 4.22–5.81)
RBC: 2.78 MIL/uL — ABNORMAL LOW (ref 4.22–5.81)
RBC: 2.89 MIL/uL — ABNORMAL LOW (ref 4.22–5.81)
RBC: 3.01 MIL/uL — AB (ref 4.22–5.81)
RDW: 15.7 % — ABNORMAL HIGH (ref 11.5–15.5)
RDW: 15.7 % — ABNORMAL HIGH (ref 11.5–15.5)
RDW: 15.9 % — AB (ref 11.5–15.5)
RDW: 16 % — AB (ref 11.5–15.5)
WBC: 10.3 10*3/uL (ref 4.0–10.5)
WBC: 10.9 10*3/uL — ABNORMAL HIGH (ref 4.0–10.5)
WBC: 9.8 10*3/uL (ref 4.0–10.5)
WBC: 9.8 10*3/uL (ref 4.0–10.5)
nRBC: 2.1 % — ABNORMAL HIGH (ref 0.0–0.2)

## 2018-10-21 LAB — COMPREHENSIVE METABOLIC PANEL
ALBUMIN: 2.8 g/dL — AB (ref 3.5–5.0)
ALBUMIN: 2.8 g/dL — AB (ref 3.5–5.0)
ALBUMIN: 3.1 g/dL — AB (ref 3.5–5.0)
ALBUMIN: 3.2 g/dL — AB (ref 3.5–5.0)
ALK PHOS: 121 U/L (ref 38–126)
ALK PHOS: 119 U/L (ref 38–126)
ALT: 2302 U/L — AB (ref 0–44)
ALT: 2317 U/L — ABNORMAL HIGH (ref 0–44)
ALT: 2678 U/L — AB (ref 0–44)
ALT: 2725 U/L — ABNORMAL HIGH (ref 0–44)
ALT: 2794 U/L — ABNORMAL HIGH (ref 0–44)
ANION GAP: 9 (ref 5–15)
AST: 3898 U/L — ABNORMAL HIGH (ref 15–41)
AST: 3956 U/L — AB (ref 15–41)
AST: 4400 U/L — AB (ref 15–41)
AST: 4378 U/L — ABNORMAL HIGH (ref 15–41)
AST: 4530 U/L — ABNORMAL HIGH (ref 15–41)
Albumin: 2.8 g/dL — ABNORMAL LOW (ref 3.5–5.0)
Alkaline Phosphatase: 128 U/L — ABNORMAL HIGH (ref 38–126)
Alkaline Phosphatase: 121 U/L (ref 38–126)
Alkaline Phosphatase: 129 U/L — ABNORMAL HIGH (ref 38–126)
Anion gap: 18 — ABNORMAL HIGH (ref 5–15)
Anion gap: 16 — ABNORMAL HIGH (ref 5–15)
Anion gap: 19 — ABNORMAL HIGH (ref 5–15)
Anion gap: 11 (ref 5–15)
BILIRUBIN TOTAL: 1.8 mg/dL — AB (ref 0.3–1.2)
BILIRUBIN TOTAL: 1.6 mg/dL — AB (ref 0.3–1.2)
BUN: 40 mg/dL — AB (ref 8–23)
BUN: 41 mg/dL — AB (ref 8–23)
BUN: 44 mg/dL — AB (ref 8–23)
BUN: 45 mg/dL — AB (ref 8–23)
BUN: 45 mg/dL — ABNORMAL HIGH (ref 8–23)
CALCIUM: 7.8 mg/dL — AB (ref 8.9–10.3)
CALCIUM: 7.6 mg/dL — AB (ref 8.9–10.3)
CHLORIDE: 91 mmol/L — AB (ref 98–111)
CHLORIDE: 91 mmol/L — AB (ref 98–111)
CO2: 20 mmol/L — ABNORMAL LOW (ref 22–32)
CO2: 21 mmol/L — ABNORMAL LOW (ref 22–32)
CO2: 23 mmol/L (ref 22–32)
CO2: 28 mmol/L (ref 22–32)
CO2: 32 mmol/L (ref 22–32)
CREATININE: 1.97 mg/dL — AB (ref 0.61–1.24)
Calcium: 8 mg/dL — ABNORMAL LOW (ref 8.9–10.3)
Calcium: 7.1 mg/dL — ABNORMAL LOW (ref 8.9–10.3)
Calcium: 7.1 mg/dL — ABNORMAL LOW (ref 8.9–10.3)
Chloride: 91 mmol/L — ABNORMAL LOW (ref 98–111)
Chloride: 90 mmol/L — ABNORMAL LOW (ref 98–111)
Chloride: 91 mmol/L — ABNORMAL LOW (ref 98–111)
Creatinine, Ser: 1.97 mg/dL — ABNORMAL HIGH (ref 0.61–1.24)
Creatinine, Ser: 1.84 mg/dL — ABNORMAL HIGH (ref 0.61–1.24)
Creatinine, Ser: 1.7 mg/dL — ABNORMAL HIGH (ref 0.61–1.24)
Creatinine, Ser: 1.65 mg/dL — ABNORMAL HIGH (ref 0.61–1.24)
GFR calc Af Amer: 36 mL/min — ABNORMAL LOW (ref >60)
GFR calc Af Amer: 39 mL/min — ABNORMAL LOW (ref >60)
GFR calc Af Amer: 36 mL/min — ABNORMAL LOW (ref >60)
GFR calc non Af Amer: 34 mL/min — ABNORMAL LOW (ref >60)
GFR calc non Af Amer: 38 mL/min — ABNORMAL LOW (ref >60)
GFR, EST AFRICAN AMERICAN: 43 mL/min — AB (ref >60)
GFR, EST AFRICAN AMERICAN: 45 mL/min — AB (ref >60)
GFR, EST NON AFRICAN AMERICAN: 31 mL/min — AB (ref >60)
GFR, EST NON AFRICAN AMERICAN: 31 mL/min — AB (ref >60)
GFR, EST NON AFRICAN AMERICAN: 37 mL/min — AB (ref >60)
GLUCOSE: 98 mg/dL (ref 70–99)
GLUCOSE: 142 mg/dL — AB (ref 70–99)
GLUCOSE: 127 mg/dL — AB (ref 70–99)
Glucose, Bld: 91 mg/dL (ref 70–99)
Glucose, Bld: 138 mg/dL — ABNORMAL HIGH (ref 70–99)
POTASSIUM: 6.8 mmol/L — AB (ref 3.5–5.1)
Potassium: 6.3 mmol/L (ref 3.5–5.1)
Potassium: 6 mmol/L — ABNORMAL HIGH (ref 3.5–5.1)
Potassium: 5.1 mmol/L (ref 3.5–5.1)
Potassium: 5 mmol/L (ref 3.5–5.1)
SODIUM: 129 mmol/L — AB (ref 135–145)
SODIUM: 132 mmol/L — AB (ref 135–145)
Sodium: 128 mmol/L — ABNORMAL LOW (ref 135–145)
Sodium: 132 mmol/L — ABNORMAL LOW (ref 135–145)
Sodium: 130 mmol/L — ABNORMAL LOW (ref 135–145)
TOTAL PROTEIN: 5.8 g/dL — AB (ref 6.5–8.1)
TOTAL PROTEIN: 5.4 g/dL — AB (ref 6.5–8.1)
TOTAL PROTEIN: 5.4 g/dL — AB (ref 6.5–8.1)
Total Bilirubin: 1.8 mg/dL — ABNORMAL HIGH (ref 0.3–1.2)
Total Bilirubin: 1.8 mg/dL — ABNORMAL HIGH (ref 0.3–1.2)
Total Bilirubin: 1.6 mg/dL — ABNORMAL HIGH (ref 0.3–1.2)
Total Protein: 6.1 g/dL — ABNORMAL LOW (ref 6.5–8.1)
Total Protein: 5.5 g/dL — ABNORMAL LOW (ref 6.5–8.1)

## 2018-10-21 LAB — PROTIME-INR
INR: 10.6 — AB
INR: 10.65 — AB
INR: 11.95 — AB
INR: 11.96
PROTHROMBIN TIME: 81.7 s — AB (ref 11.4–15.2)
PROTHROMBIN TIME: 82 s — AB (ref 11.4–15.2)
PROTHROMBIN TIME: 89.7 s — AB (ref 11.4–15.2)
Prothrombin Time: 89.6 seconds — ABNORMAL HIGH (ref 11.4–15.2)

## 2018-10-21 LAB — BLOOD GAS, VENOUS
ACID-BASE EXCESS: 8.4 mmol/L — AB (ref 0.0–2.0)
Bicarbonate: 34.2 mmol/L — ABNORMAL HIGH (ref 20.0–28.0)
O2 SAT: 64.3 %
PATIENT TEMPERATURE: 37
PH VEN: 7.45 — AB (ref 7.250–7.430)
PO2 VEN: 35 mmHg (ref 32.0–45.0)
Patient temperature: 37
pCO2, Ven: 39 mmHg — ABNORMAL LOW (ref 44.0–60.0)
pCO2, Ven: 54 mmHg (ref 44.0–60.0)
pH, Ven: 7.41 (ref 7.250–7.430)
pO2, Ven: 33 mmHg (ref 32.0–45.0)

## 2018-10-21 LAB — TYPE AND SCREEN
ABO/RH(D): A POS
Antibody Screen: NEGATIVE

## 2018-10-21 LAB — PREPARE FRESH FROZEN PLASMA: UNIT DIVISION: 0

## 2018-10-21 LAB — TROPONIN I
TROPONIN I: 0.08 ng/mL — AB (ref <0.03)
Troponin I: 0.07 ng/mL (ref <0.03)
Troponin I: 0.07 ng/mL (ref <0.03)

## 2018-10-21 LAB — BPAM FFP
Blood Product Expiration Date: 201911282359
UNIT TYPE AND RH: 6200

## 2018-10-21 LAB — GLUCOSE, RANDOM: Glucose, Bld: 127 mg/dL — ABNORMAL HIGH (ref 70–99)

## 2018-10-21 LAB — T4, FREE: Free T4: 1.36 ng/dL (ref 0.82–1.77)

## 2018-10-21 LAB — GLUCOSE, CAPILLARY
GLUCOSE-CAPILLARY: 124 mg/dL — AB (ref 70–99)
Glucose-Capillary: 118 mg/dL — ABNORMAL HIGH (ref 70–99)
Glucose-Capillary: 120 mg/dL — ABNORMAL HIGH (ref 70–99)

## 2018-10-21 LAB — LACTIC ACID, PLASMA
LACTIC ACID, VENOUS: 2.2 mmol/L — AB (ref 0.5–1.9)
Lactic Acid, Venous: 8.1 mmol/L (ref 0.5–1.9)
Lactic Acid, Venous: 7 mmol/L (ref 0.5–1.9)
Lactic Acid, Venous: 4 mmol/L (ref 0.5–1.9)

## 2018-10-21 LAB — APTT: aPTT: 37 seconds — ABNORMAL HIGH (ref 24–36)

## 2018-10-21 LAB — AMMONIA: AMMONIA: 94 umol/L — AB (ref 9–35)

## 2018-10-21 LAB — PROTEIN, URINE, RANDOM: Total Protein, Urine: 42 mg/dL

## 2018-10-21 LAB — PHOSPHORUS
Phosphorus: 6.3 mg/dL — ABNORMAL HIGH (ref 2.5–4.6)
Phosphorus: 5.8 mg/dL — ABNORMAL HIGH (ref 2.5–4.6)
Phosphorus: 5.6 mg/dL — ABNORMAL HIGH (ref 2.5–4.6)

## 2018-10-21 LAB — MRSA PCR SCREENING: MRSA by PCR: NEGATIVE

## 2018-10-21 LAB — CREATININE, URINE, RANDOM: Creatinine, Urine: 41 mg/dL

## 2018-10-21 LAB — TSH: TSH: 1.345 u[IU]/mL (ref 0.350–4.500)

## 2018-10-21 LAB — ACETAMINOPHEN LEVEL: Acetaminophen (Tylenol), Serum: <10 ug/mL — ABNORMAL LOW (ref 10–30)

## 2018-10-21 LAB — GAMMA GT: GGT: 85 U/L — AB (ref 7–50)

## 2018-10-21 LAB — PROCALCITONIN: Procalcitonin: 0.1 ng/mL

## 2018-10-21 LAB — NA AND K (SODIUM & POTASSIUM), RAND UR
Potassium Urine: 41 mmol/L
Sodium, Ur: 47 mmol/L

## 2018-10-21 LAB — SALICYLATE LEVEL: Salicylate Lvl: <7 mg/dL (ref 2.8–30.0)

## 2018-10-21 LAB — LACTATE DEHYDROGENASE: LDH: 2621 U/L — AB (ref 98–192)

## 2018-10-21 LAB — LIPASE, BLOOD: Lipase: 32 U/L (ref 11–51)

## 2018-10-21 LAB — BRAIN NATRIURETIC PEPTIDE: B NATRIURETIC PEPTIDE 5: 4264 pg/mL — AB (ref 0.0–100.0)

## 2018-10-21 MED ORDER — DEXTROSE 50 % IV SOLN
1.0000 | Freq: Once | INTRAVENOUS | Status: AC
  Administered 2018-10-21: 50 mL via INTRAVENOUS
  Filled 2018-10-21: qty 50

## 2018-10-21 MED ORDER — FUROSEMIDE 10 MG/ML IJ SOLN
40.0000 mg | Freq: Once | INTRAMUSCULAR | Status: AC
  Administered 2018-10-21: 40 mg via INTRAVENOUS
  Filled 2018-10-21: qty 4

## 2018-10-21 MED ORDER — PHYTONADIONE 5 MG PO TABS
5.0000 mg | ORAL_TABLET | Freq: Once | ORAL | Status: AC
  Administered 2018-10-21: 5 mg via ORAL
  Filled 2018-10-21: qty 1

## 2018-10-21 MED ORDER — INSULIN ASPART 100 UNIT/ML IV SOLN
5.0000 [IU] | Freq: Once | INTRAVENOUS | Status: AC
  Administered 2018-10-21: 5 [IU] via INTRAVENOUS
  Filled 2018-10-21: qty 0.05

## 2018-10-21 MED ORDER — INSULIN ASPART 100 UNIT/ML ~~LOC~~ SOLN: 0.0000 [IU] | Freq: Every day | SUBCUTANEOUS | Status: DC

## 2018-10-21 MED ORDER — SODIUM CHLORIDE 0.9 % NICU IV INFUSION SIMPLE
1000.0000 mL | INJECTION | Freq: Once | INTRAVENOUS | Status: AC
  Administered 2018-10-21: 1000 mL via INTRAVENOUS
  Filled 2018-10-21: qty 1000

## 2018-10-21 MED ORDER — SENNOSIDES-DOCUSATE SODIUM 8.6-50 MG PO TABS: 1.0000 | ORAL_TABLET | Freq: Every evening | ORAL | Status: DC | PRN

## 2018-10-21 MED ORDER — SODIUM CHLORIDE 0.9% IV SOLUTION: Freq: Once | INTRAVENOUS | Status: DC

## 2018-10-21 MED ORDER — ALBUTEROL SULFATE (2.5 MG/3ML) 0.083% IN NEBU
15.0000 mg | INHALATION_SOLUTION | Freq: Once | RESPIRATORY_TRACT | Status: AC
  Administered 2018-10-21: 15 mg via RESPIRATORY_TRACT
  Filled 2018-10-21 (×2): qty 18

## 2018-10-21 MED ORDER — PANTOPRAZOLE SODIUM 40 MG PO TBEC
40.0000 mg | DELAYED_RELEASE_TABLET | Freq: Every day | ORAL | Status: DC
  Administered 2018-10-21 – 2018-10-30 (×10): 40 mg via ORAL
  Filled 2018-10-21 (×10): qty 1

## 2018-10-21 MED ORDER — DILTIAZEM HCL ER COATED BEADS 180 MG PO CP24: 180.0000 mg | ORAL_CAPSULE | Freq: Every day | ORAL | Status: DC

## 2018-10-21 MED ORDER — PHYTONADIONE 5 MG PO TABS
2.5000 mg | ORAL_TABLET | Freq: Once | ORAL | Status: DC
  Filled 2018-10-21: qty 1

## 2018-10-21 MED ORDER — SODIUM BICARBONATE 8.4 % IV SOLN
INTRAVENOUS | Status: AC
  Administered 2018-10-21: 08:00:00 via INTRAVENOUS
  Filled 2018-10-21: qty 150

## 2018-10-21 MED ORDER — SODIUM BICARBONATE 8.4 % IV SOLN
50.0000 meq | Freq: Once | INTRAVENOUS | Status: AC
  Administered 2018-10-21: 50 meq via INTRAVENOUS
  Filled 2018-10-21: qty 50

## 2018-10-21 MED ORDER — BISACODYL 5 MG PO TBEC
5.0000 mg | DELAYED_RELEASE_TABLET | Freq: Every day | ORAL | Status: DC | PRN
  Administered 2018-10-24: 5 mg via ORAL
  Filled 2018-10-21 (×2): qty 1

## 2018-10-21 MED ORDER — ONDANSETRON HCL 4 MG/2ML IJ SOLN: 4.0000 mg | Freq: Four times a day (QID) | INTRAMUSCULAR | Status: DC | PRN

## 2018-10-21 MED ORDER — ASPIRIN EC 81 MG PO TBEC: 81.0000 mg | DELAYED_RELEASE_TABLET | Freq: Every day | ORAL | Status: DC

## 2018-10-21 MED ORDER — SODIUM POLYSTYRENE SULFONATE 15 GM/60ML PO SUSP
30.0000 g | Freq: Once | ORAL | Status: AC
  Administered 2018-10-21: 30 g via ORAL
  Filled 2018-10-21: qty 120

## 2018-10-21 MED ORDER — INSULIN ASPART 100 UNIT/ML ~~LOC~~ SOLN
0.0000 [IU] | Freq: Three times a day (TID) | SUBCUTANEOUS | Status: DC
  Administered 2018-10-21 – 2018-10-26 (×6): 1 [IU] via SUBCUTANEOUS
  Filled 2018-10-21 (×6): qty 1

## 2018-10-21 MED ORDER — FINASTERIDE 5 MG PO TABS
5.0000 mg | ORAL_TABLET | Freq: Every day | ORAL | Status: DC
  Administered 2018-10-21 – 2018-10-30 (×10): 5 mg via ORAL
  Filled 2018-10-21 (×11): qty 1

## 2018-10-21 MED ORDER — ALBUTEROL SULFATE (2.5 MG/3ML) 0.083% IN NEBU
10.0000 mg | INHALATION_SOLUTION | Freq: Once | RESPIRATORY_TRACT | Status: AC
  Administered 2018-10-21: 10 mg via RESPIRATORY_TRACT
  Filled 2018-10-21: qty 12

## 2018-10-21 MED ORDER — SODIUM CHLORIDE 0.9 % IV BOLUS
1000.0000 mL | Freq: Once | INTRAVENOUS | Status: AC
  Administered 2018-10-21: 1000 mL via INTRAVENOUS

## 2018-10-21 NOTE — ED Notes (Addendum)
Date and time results received: 10/21/18 0138  Test: Troponin, K, INR Critical Value: Trop: 0.07, K: 6.8, INR: 10.65  Name of Provider Notified: Dr Beather Arbour  Orders Received? Or Actions Taken?: Acknowledged

## 2018-10-21 NOTE — ED Notes (Signed)
Admitting MD at bedside. Called MD for need for additional orders for labs due to past critical values. Reassessing per MD.

## 2018-10-21 NOTE — Consult Note (Signed)
[facility] Cardiology Consultation Note  [name], MRN: [id number], DOB/AGE: [birth year] 77 y.o. Admit date: 10/20/2018   Date of Consult: 10/21/2018 Primary Physician: Guadalupe Maple, MD Primary Cardiologist: Raynelle Chary  Chief Complaint:  Chief Complaint  Patient presents with  . Shortness of Breath  . Weakness   Reason for Consult: Atrial fibrillation  HPI: 77 y.o. male with known cardiovascular disease status post multiple PCI and stent placements especially in the left anterior descending artery and aortic valve replacement in 2004 with paroxysmal nonvalvular atrial fibrillation but currently been in atrial fibrillation chronically.  He has had a history of ventricular tachycardia cerebrovascular accident as well.  He does have congestive heart failure in the past with an echocardiogram recently showing mild LV systolic dysfunction with ejection fraction of 50%.  He was seen in the emergency room at this time after having a fall last week and stitches and then being extremely weak.  When he came in he had an elevated white blood cell count and elevated INR of 11 hepatocellular failure with right lung mass with effusions and atelectasis.  Multiple medications were discontinued due to this significant liver issue and the patient has continued to be weak.  With this change in medications the patient has had recent increase in heart rate of atrial fibrillation with a heart rate of 110 bpm.  This is with a bundle branch block.  There is no current evidence of myocardial infarction but the patient still feels weak.  Is been no evidence of chest pain at this time  Past Medical History:  Diagnosis Date  . AICD (automatic cardioverter/defibrillator) present   . Aortic valve regurgitation   . Atrial fibrillation ([place])   . Barrett's esophagus   . Cancer ([place])    sate III lung  . CHF (congestive heart failure) ([place])   . COPD (chronic obstructive pulmonary disease)  ([place])   . Diverticulosis   . Dysrhythmia   . GERD (gastroesophageal reflux disease)   . Gout   . Headache    aura only  . Heart murmur   . Hematuria   . Hernia of abdominal cavity   . History of prosthetic heart valve   . Hyperlipidemia   . Hypertension   . Nodular prostate with urinary obstruction   . Presence of permanent cardiac pacemaker   . Renal insufficiency   . Substance abuse ([place])    alcohol      Surgical History:  Past Surgical History:  Procedure Laterality Date  . CARDIAC VALVE REPLACEMENT N/A 2004  . CARPAL TUNNEL RELEASE Right 2015  . CATARACT EXTRACTION Right 2017  . COLONOSCOPY WITH PROPOFOL N/A 2014  . CORONARY ANGIOPLASTY     stints x2  . EYE SURGERY     cross eyed  . FOOT SURGERY Bilateral    [place]  . FRACTURE SURGERY Right    [place]  . INGUINAL HERNIA REPAIR Right 09/16/2017   Procedure: HERNIA REPAIR INGUINAL ADULT;  Surgeon: Christene Lye, MD;  Location: [facility] ORS;  Service: General;  Laterality: Right;  . INSERT / REPLACE / [place]     ICD  . PACEMAKER IMPLANT Left 2013   and ICD  . PORTA CATH INSERTION N/A 09/21/2018   Procedure: PORTA CATH INSERTION;  Surgeon: Algernon Huxley, MD;  Location: [place] [facility];  Service: Cardiovascular;  Laterality: N/A;  . VASECTOMY       Home Meds: Prior to Admission medications   Medication  Sig Start Date End Date Taking? Authorizing Provider  acetaminophen (TYLENOL) 650 MG CR tablet Take 650 mg by mouth 2 (two) times daily.    [provider]  amiodarone (PACERONE) 200 MG tablet Take 2 tablets (400 mg total) by mouth 2 (two) times daily. 10/15/18   Hillary Bow, MD  aspirin EC 81 MG tablet Take 81 mg by mouth daily.    [provider]  COLCRYS 0.6 MG tablet TAKE 1 TABLET BY MOUTH ONCE DAILY 09/13/18   Guadalupe Maple, MD  desonide (DESOWEN) 0.05 % cream Apply topically 2 (two) times daily. 03/30/18   Volney American, PA-C  dexamethasone (DECADRON) 4 MG  tablet Take 2 tablets (8 mg total) by mouth daily. Start the day after chemotherapy for 2 days. Patient not taking: Reported on 10/05/2018 09/16/18   Sindy Guadeloupe, MD  diltiazem (CARDIZEM CD) 180 MG 24 hr capsule  05/06/18   [provider]  empagliflozin (JARDIANCE) 10 MG TABS tablet Take 10 mg by mouth daily.    [provider]  fexofenadine (ALLEGRA) 180 MG tablet Take 180 mg by mouth daily.    [provider]  finasteride (PROSCAR) 5 MG tablet TAKE 1 TABLET BY MOUTH ONCE DAILY 07/12/18   Volney American, PA-C  fluticasone [facility]) 50 MCG/ACT nasal spray Place 2 sprays daily into both nostrils. 10/14/17   Guadalupe Maple, MD  furosemide (LASIX) 40 MG tablet Take 40 mg by mouth daily.     [provider]  ketoconazole (NIZORAL) 2 % shampoo Apply 1 application topically 2 (two) times a week. Patient not taking: Reported on 10/19/2018 09/07/18   Volney American, PA-C  lidocaine-prilocaine (EMLA) cream Apply to affected area once Patient not taking: Reported on 10/19/2018 09/16/18   Sindy Guadeloupe, MD  loratadine (CLARITIN) 10 MG tablet Take 1 tablet (10 mg total) by mouth daily. Patient not taking: Reported on 10/12/2018 07/27/18   Volney American, PA-C  LORazepam (ATIVAN) 0.5 MG tablet Take 1 tablet (0.5 mg total) by mouth every 6 (six) hours as needed (Nausea or vomiting). Patient not taking: Reported on 10/19/2018 09/16/18   Sindy Guadeloupe, MD  Magnesium 400 MG TABS Take 400 mg by mouth daily.     [provider]  Multiple Vitamins-Minerals (CENTRUM ADULTS PO) Take 1 tablet by mouth daily.     [provider]  omeprazole (PRILOSEC) 20 MG capsule Take 1 capsule (20 mg total) by mouth daily. 10/19/17   Guadalupe Maple, MD  ondansetron (ZOFRAN) 8 MG tablet Take 1 tablet (8 mg total) by mouth 2 (two) times daily as needed for refractory nausea / vomiting. Start on day 3 after chemo. Patient not taking: Reported on  10/19/2018 09/16/18   Sindy Guadeloupe, MD  Polyethyl Glycol-Propyl Glycol (SYSTANE OP) Place 1 drop into both eyes as needed (for dry eyes).    [provider]  potassium chloride SA (K-DUR,KLOR-CON) 20 MEQ tablet Take 1 tablet (20 mEq total) by mouth daily. 10/05/18   Sindy Guadeloupe, MD  prochlorperazine (COMPAZINE) 10 MG tablet Take 1 tablet (10 mg total) by mouth every 6 (six) hours as needed (Nausea or vomiting). Patient not taking: Reported on 10/19/2018 09/16/18   Sindy Guadeloupe, MD  tamsulosin (FLOMAX) 0.4 MG CAPS capsule Take 2 capsules (0.8 mg total) by mouth daily. Patient not taking: Reported on 10/19/2018 07/27/18   Volney American, PA-C  tiotropium [facility]) 18 MCG inhalation capsule Place 1 capsule (  18 mcg total) into inhaler and inhale daily. 10/19/17   Guadalupe Maple, MD  UNABLE TO FIND CBD Hemp topical cream    [provider]  warfarin (COUMADIN) 1 MG tablet TAKE 1 TABLET BY MOUTH ONCE DAILY 08/07/18   Johnson, Megan P, DO  warfarin (COUMADIN) 3 MG tablet TAKE 1 TABLET BY MOUTH ONCE DAILY. 06/21/18   Guadalupe Maple, MD  warfarin (COUMADIN) 4 MG tablet TAKE 1 TABLET BY MOUTH ONCE DAILY AT 6 P.M. Patient not taking: Reported on 10/12/2018 11/04/17   Guadalupe Maple, MD    Inpatient Medications:  . sodium chloride   Intravenous Once  . finasteride  5 mg Oral Daily  . insulin aspart  0-5 Units Subcutaneous QHS  . insulin aspart  0-9 Units Subcutaneous TID WC  . pantoprazole  40 mg Oral Daily  . phytonadione  2.5 mg Oral Once     Allergies:  Allergies  Allergen Reactions  . Prednisone Other (See Comments)    Reaction: "organs shutting down"    Social History   Socioeconomic History  . Marital status: Married    Spouse name: Belenda Cruise   . Number of children: 5  . Years of education: Not on file  . Highest education level: Not on file  Occupational History  . Not on file  Social Needs  . Financial resource strain: Not hard at all  . Food  insecurity:    Worry: Never true    Inability: Never true  . Transportation needs:    Medical: No    Non-medical: No  Tobacco Use  . Smoking status: Former Smoker    Types: Cigarettes    Last attempt to quit: 04/24/1991    Years since quitting: 27.5  . Smokeless tobacco: Never Used  Substance and Sexual Activity  . Alcohol use: Yes    Alcohol/week: 2.0 standard drinks    Types: 2 Shots of liquor per week    Comment: quit December 2015/restarted-2 drinks per night  . Drug use: No  . Sexual activity: Never  Lifestyle  . Physical activity:    Days per week: 0 days    Minutes per session: 0 min  . Stress: Not at all  Relationships  . Social connections:    Talks on phone: Once a week    Gets together: Once a week    Attends religious service: More than 4 times per year    Active member of club or organization: No    Attends meetings of clubs or organizations: Never    Relationship status: Married  . Intimate partner violence:    Fear of current or ex partner: No    Emotionally abused: No    Physically abused: No    Forced sexual activity: No  Other Topics Concern  . Not on file  Social History Narrative  . Not on file     Family History  Problem Relation Age of Onset  . Heart attack Mother   . Diabetes Maternal Grandmother   . Lung cancer Sister      Review of Systems Positive for weakness fatigue shortness of breath lower extremity edema cough congestion Negative for: General:  chills, fever, night sweats or weight changes.  Cardiovascular: PND orthopnea syncope dizziness  Dermatological skin lesions rashes Respiratory: Positive for cough congestion Urologic: Frequent urination urination at night and hematuria Abdominal: negative for nausea, vomiting, diarrhea, bright red blood per rectum, melena, or hematemesis Neurologic: negative for visual changes, and/or hearing changes  All other systems reviewed and are otherwise negative except as noted  above.  Labs: Recent Labs    10/21/18 0004 10/21/18 0235 10/21/18 0755  TROPONINI 0.07* 0.07* 0.08*   Lab Results  Component Value Date   WBC 10.3 10/21/2018   HGB 8.4 (L) 10/21/2018   HCT 26.0 (L) 10/21/2018   MCV 95.9 10/21/2018   PLT 119 (L) 10/21/2018    Recent Labs  Lab 10/21/18 1337  NA 130*  K 5.1  CL 91*  CO2 28  BUN 45*  CREATININE 1.70*  CALCIUM 7.1*  PROT 5.4*  BILITOT 1.6*  ALKPHOS 121  ALT 2,725*  AST 4,378*  GLUCOSE 138*   Lab Results  Component Value Date   CHOL 166 05/08/2018   HDL 118 10/24/2017   LDLCALC 57 10/24/2017   TRIG 75 05/08/2018   No results found for: DDIMER  Radiology/Studies:  Ct Head Wo Contrast  Result Date: 10/12/2018 CLINICAL DATA:  Acute onset of dizziness and fall. Hit head on headboard. Laceration at the right temple. Patient on Coumadin. Initial encounter. EXAM: CT HEAD WITHOUT CONTRAST TECHNIQUE: Contiguous axial images were obtained from the base of the skull through the vertex without intravenous contrast. COMPARISON:  CT of the head performed 09/04/2018 FINDINGS: Brain: No evidence of acute infarction, hemorrhage, hydrocephalus, extra-axial collection or mass lesion / mass effect. Prominence of the ventricles and sulci reflects mild to moderate cortical volume loss. Cerebellar atrophy is noted. Scattered periventricular and subcortical white matter change likely reflects small vessel ischemic microangiopathy. A small chronic lacunar infarct is noted at the left cerebellar hemisphere. The brainstem and fourth ventricle are within normal limits. The basal ganglia are unremarkable in appearance. The cerebral hemispheres demonstrate grossly normal gray-white differentiation. No mass effect or midline shift is seen. Vascular: No hyperdense vessel or unexpected calcification. Skull: There is no evidence of fracture; visualized osseous structures are unremarkable in appearance. Sinuses/Orbits: The visualized portions of the orbits  are within normal limits. The paranasal sinuses and mastoid air cells are well-aerated. Other: A prominent soft tissue laceration is noted anterior to the right ear, with soft tissue air extending below the right ear. IMPRESSION: 1. No evidence of traumatic intracranial injury or fracture. 2. Prominent soft tissue laceration anterior to the right ear, with soft tissue air extending below the right ear. 3. Mild to moderate cortical volume loss and scattered small vessel ischemic microangiopathy. 4. Small chronic lacunar infarct at the left cerebellar hemisphere. Electronically Signed   By: Garald Balding M.D.   On: 10/12/2018 00:18   Ct Chest Wo Contrast  Result Date: 10/21/2018 CLINICAL DATA:  Shortness of breath and weakness. EXAM: CT CHEST WITHOUT CONTRAST TECHNIQUE: Multidetector CT imaging of the chest was performed following the standard protocol without IV contrast. COMPARISON:  08/21/2018 FINDINGS: Cardiovascular: Evaluation of vascular structures is limited without IV contrast material. Cardiac pacemaker. Diffuse cardiac enlargement. No pericardial effusions. Coronary artery calcifications. Postoperative changes in the mediastinum. Calcification throughout the aorta. Mediastinum/Nodes: Moderate prominent lymph nodes throughout the mediastinum without change since prior study, likely reactive. Lungs/Pleura: Bilateral pleural effusions, greater on the right. Atelectasis in the lung bases. Known right lower lobe mass is visible but somewhat obscured by the effusion and atelectasis. Emphysematous changes in the lungs. No pneumothorax. Airways are patent. Upper Abdomen: Probable hepatic cirrhosis. Free fluid in the right upper quadrant likely represents ascites. Musculoskeletal: Degenerative changes in the spine. No destructive bone lesions. Postoperative sternotomy wires. IMPRESSION: Known mass in the right lung base. Cardiac  enlargement. Bilateral pleural effusions with basilar atelectasis, greater on the  right. Mild progression on the right since previous study. Hepatic cirrhosis with upper abdominal ascites. Electronically Signed   By: Lucienne Capers M.D.   On: 10/21/2018 06:53   Dg Hand 2 View Left  Result Date: 10/12/2018 CLINICAL DATA:  Pt reports he had a fall last night and is now experiencing pain in his left hand. Pt hand appears swollen and red at this time EXAM: LEFT HAND - 2 VIEW COMPARISON:  None. FINDINGS: IV tubing overlies the wrist. There are degenerative changes insert wrist, primarily along the radial aspect of the carpals and the first carpometacarpal joint. There is degenerative change at the second metacarpophalangeal joint. No acute fracture or subluxation. Atherosclerotic calcification of the small arteries. IMPRESSION: No evidence for acute  abnormality.  Degenerative changes. Electronically Signed   By: Nolon Nations M.D.   On: 10/12/2018 15:27   US Venous Img Upper Uni Left  Result Date: 10/15/2018 CLINICAL DATA:  Left arm swelling. Recent fall. Left cardiac ICD. Edema from the elbow to the wrist. EXAM: LEFT UPPER EXTREMITY VENOUS DOPPLER ULTRASOUND TECHNIQUE: Gray-scale sonography with graded compression, as well as color Doppler and duplex ultrasound were performed to evaluate the upper extremity deep venous system from the level of the subclavian vein and including the jugular, axillary, basilic, radial, ulnar and upper cephalic vein. Spectral Doppler was utilized to evaluate flow at rest and with distal augmentation maneuvers. COMPARISON:  Chest radiograph 10/11/2018 FINDINGS: Contralateral Subclavian Vein: Normal respiratory phasicity. Normal color Doppler flow in the right subclavian vein. No evidence for right subclavian thrombus. Internal Jugular Vein: No evidence of thrombus. Normal compressibility, respiratory phasicity and response to augmentation. Subclavian Vein: The left cardiac ICD lead is noted. There appears to be flow around the lead and left subclavian vein  appears to be patent without thrombus. Axillary Vein: No evidence of thrombus. Normal compressibility, respiratory phasicity and response to augmentation. Cephalic Vein: No evidence of thrombus. Normal compressibility, respiratory phasicity and response to augmentation. Basilic Vein: No evidence of thrombus. Normal compressibility, respiratory phasicity and response to augmentation. Brachial Veins: No evidence of thrombus. Normal compressibility, respiratory phasicity and response to augmentation. Radial Veins: No evidence of thrombus. Normal compressibility, respiratory phasicity and response to augmentation. Single radial vein is identified. Ulnar Veins: No evidence of thrombus. Normal compressibility, respiratory phasicity and response to augmentation. Single ulnar vein is identified. Venous Reflux:  None visualized. Other Findings:  Subcutaneous edema. IMPRESSION: No evidence of DVT within the left upper extremity. Limited evaluation of the left subclavian vein due to the cardiac ICD but no evidence for thrombus in this area. Electronically Signed   By: Markus Daft M.D.   On: 10/15/2018 14:58   Ct Abdomen W Wo Contrast  Result Date: 09/25/2018 CLINICAL DATA:  Evaluate pancreatic calcifications EXAM: CT ABDOMEN WITHOUT AND WITH CONTRAST TECHNIQUE: Multidetector CT imaging of the abdomen was performed following the standard protocol before and following the bolus administration of intravenous contrast. CONTRAST:  121mL ISOVUE-300 IOPAMIDOL (ISOVUE-300) INJECTION 61% COMPARISON:  PET-CT 08/30/2018. FINDINGS: Lower chest: Large right lower lobe lung mass is again identified measuring 7.9 cm. Loculated pleural fluid is identified overlying the right midlung and right lower lobe. Small left pleural effusion noted. Aortic atherosclerosis is identified. Hepatobiliary: No focal liver abnormality. The gallbladder is not confidently identified and may be surgically absent. Pancreas: No pancreatic inflammation  identified faint punctate areas of mild hyperattenuation within the head of pancreas are again  identified within head of pancreas, image 39/2 and may represent small calcifications. 1.4 cm low-density structure within the head of pancreas is identified measuring 32 HU, image 96/14. This is favored to represent a mildly dilated common bile duct. This roughly corresponds to the areas of calcification within the head of pancreas which may reflect choledocholithiasis. Body and tail of pancreas appear normal. Spleen: Spleen normal. Adrenals/Urinary Tract: Unremarkable appearance of the adrenal glands. There is a cyst arising from inferior pole of right kidney measuring 3 cm, image 46/11. Smaller cysts noted within the upper pole of the right kidney. Multiple areas of cortical scarring involve the left kidney. Stomach/Bowel: Stomach and small bowel loops have a normal course and caliber. Extensive colonic diverticulosis noted. Vascular/Lymphatic: Aortic atherosclerosis. No aneurysm. No adenopathy identified within the abdomen. Other: Trace free fluid identified along the pericolic gutters. Musculoskeletal: Mild thoraco lumbar scoliosis and multilevel degenerative disc disease. IMPRESSION: 1. Faint calcifications within head of pancreas are again noted. There is cystic area of low attenuation within the head of pancreas which may represent a mildly dilated common bile duct. Calcifications may represent choledocholithiasis. In a patient who cannot have an MRI due to pacer device ERCP may be helpful to confirm common bile duct stones. 2. Large right lower lobe lung mass with surrounding loculated pleural fluid 3.  Aortic Atherosclerosis (ICD10-I70.0). Electronically Signed   By: Kerby Moors M.D.   On: 09/25/2018 16:27   Dg Chest Port 1 View  Result Date: 10/21/2018 CLINICAL DATA:  Shortness of breath. EXAM: PORTABLE CHEST 1 VIEW COMPARISON:  Chest x-rays dated 10/11/2018 and 08/10/2018. FINDINGS: Increased opacity at  the RIGHT lung base. Probable mild atelectasis and/or small pleural effusion at the LEFT lung base. No pneumothorax seen. No acute or suspicious osseous finding. Stable cardiomegaly. RIGHT chest wall Port-A-Cath appears stable in position with tip at the level of the mid/lower SVC. LEFT chest wall pacemaker/ICD apparatus is stable in position. Median sternotomy wires appear intact and stable in alignment. IMPRESSION: 1. Increasing opacity at the RIGHT lung base. This is mostly related to the RIGHT lower lobe mass identified on recent chest CT (08/21/2018) and PET-CT (08/30/2018), likely with superimposed pleural effusion and/or atelectasis that has slightly increased in the interval. 2. Stable cardiomegaly. 3. Probable mild atelectasis and/or small pleural effusion at the LEFT lung base. Electronically Signed   By: Franki Cabot M.D.   On: 10/21/2018 00:32   Dg Chest Port 1 View  Result Date: 10/12/2018 CLINICAL DATA:  Status post fall, with concern for chest injury. Initial encounter. EXAM: PORTABLE CHEST 1 VIEW COMPARISON:  CT of the chest performed 08/21/2018 FINDINGS: There is a persistent small right-sided pleural effusion. The known right lower lobe mass is better characterized on prior CT. There is suggestion of underlying interstitial edema, which may be transient in nature. No pneumothorax is seen. The cardiomediastinal silhouette is borderline normal in size. The patient is status post median sternotomy. An AICD is noted overlying the left chest wall, with a single lead ending overlying the right ventricle. The patient's right chest port is noted ending about the mid SVC. No displaced rib fractures are seen. IMPRESSION: 1. Persistent small right-sided pleural effusion. Known right lower lobe mass is better characterized on prior CT. Suggestion of underlying interstitial edema, which may be transient in nature. 2. No displaced rib fracture seen. Electronically Signed   By: Garald Balding M.D.   On:  10/12/2018 00:22   US Liver Doppler  Result Date: 10/21/2018 CLINICAL  DATA:  Abnormal transaminases. Patient undergoing chemotherapy and radiation for stage III lung cancer. EXAM: DUPLEX ULTRASOUND OF LIVER TECHNIQUE: Color and duplex Doppler ultrasound was performed to evaluate the hepatic in-flow and out-flow vessels. COMPARISON:  CT abdomen 09/25/2018 FINDINGS: Gallbladder: The gallbladder is not definitively identified. In the gallbladder fossa, there is a structure which is could represent a severely contracted gallbladder. No significant gallbladder lumen is visualized. No stones are identified. Murphy's sign is negative. Common bile duct: Diameter: 4.1 mm, normal Liver: normal parenchymal echogenicity. Normal hepatic contour without nodularity. No focal lesion, mass or intrahepatic biliary ductal dilatation. Portal Vein Velocities Main:  29 cm/sec Right:  39 cm/sec Left:  116 cm/sec Main portal vein demonstrates bidirectional flow with intermittent hepatopetal and hepatofugal flow direction. Right and left portal veins demonstrate normal hepatopetal flow direction. Hepatic Vein Velocities Right:  32 cm/sec Middle:  56 cm/sec Left:  30 cm/sec Normal flow direction demonstrated in the hepatic veins. IVC: Present and patent with normal respiratory phasicity. Velocity measures 36 centimeters/second. Hepatic Artery Velocity:  130/32 cm/sec Splenic Vein Velocity:  48 cm/sec Varices: Not identified. Ascites: Minimal fluid over the liver edge. Incidental note of small right pleural effusion. IMPRESSION: 1. Gallbladder is markedly contracted, likely to be physiologic but nonspecific. No stones identified. 2. Normal flow velocities demonstrated in the portal and hepatic veins. Bidirectional flow is demonstrated in the main portal vein. Bidirectional flow may be due to Valsalva or early portal hypertension. 3. Right pleural effusion and small amount of ascites. Electronically Signed   By: Lucienne Capers M.D.    On: 10/21/2018 05:26   US Abdomen Limited Ruq  Result Date: 10/21/2018 CLINICAL DATA:  Abnormal transaminases. Patient undergoing chemotherapy and radiation for stage III lung cancer. EXAM: DUPLEX ULTRASOUND OF LIVER TECHNIQUE: Color and duplex Doppler ultrasound was performed to evaluate the hepatic in-flow and out-flow vessels. COMPARISON:  CT abdomen 09/25/2018 FINDINGS: Gallbladder: The gallbladder is not definitively identified. In the gallbladder fossa, there is a structure which is could represent a severely contracted gallbladder. No significant gallbladder lumen is visualized. No stones are identified. Murphy's sign is negative. Common bile duct: Diameter: 4.1 mm, normal Liver: normal parenchymal echogenicity. Normal hepatic contour without nodularity. No focal lesion, mass or intrahepatic biliary ductal dilatation. Portal Vein Velocities Main:  29 cm/sec Right:  39 cm/sec Left:  116 cm/sec Main portal vein demonstrates bidirectional flow with intermittent hepatopetal and hepatofugal flow direction. Right and left portal veins demonstrate normal hepatopetal flow direction. Hepatic Vein Velocities Right:  32 cm/sec Middle:  56 cm/sec Left:  30 cm/sec Normal flow direction demonstrated in the hepatic veins. IVC: Present and patent with normal respiratory phasicity. Velocity measures 36 centimeters/second. Hepatic Artery Velocity:  130/32 cm/sec Splenic Vein Velocity:  48 cm/sec Varices: Not identified. Ascites: Minimal fluid over the liver edge. Incidental note of small right pleural effusion. IMPRESSION: 1. Gallbladder is markedly contracted, likely to be physiologic but nonspecific. No stones identified. 2. Normal flow velocities demonstrated in the portal and hepatic veins. Bidirectional flow is demonstrated in the main portal vein. Bidirectional flow may be due to Valsalva or early portal hypertension. 3. Right pleural effusion and small amount of ascites. Electronically Signed   By: Lucienne Capers  M.D.   On: 10/21/2018 05:26    EKG: Atrial fibrillation with rapid ventricular rate and bundle branch block  Weights: Filed Weights   10/21/18 0054 10/21/18 1124  Weight: 72.6 kg 76.1 kg     Physical Exam: Blood pressure 118/85,  pulse 95, temperature 97.9 F (36.6 C), temperature source Oral, resp. rate 19, height 5\' 3"  (1.6 m), weight 76.1 kg, SpO2 (!) 87 %. Body mass index is 29.72 kg/m. General: Well developed, well nourished, in no acute distress. Head eyes ears nose throat: Normocephalic, atraumatic, sclera non-icteric, no xanthomas, nares are without discharge. No apparent thyromegaly and/or mass  Lungs: Normal respiratory effort.  no wheezes, no rales, some rhonchi.  Bibasilar decreased breath sounds Heart: Irregular with normal S1 chemical S2.  2+ right upper sternal border murmur gallop, no rub, PMI is normal size and placement, carotid upstroke normal with bruit, jugular venous pressure is normal Abdomen: Soft, non-tender, non-distended with normoactive bowel sounds. No hepatomegaly. No rebound/guarding. No obvious abdominal masses. Abdominal aorta is normal size without bruit Extremities: Trace to 1+ edema. no cyanosis, no clubbing, no ulcers  Peripheral : 2+ bilateral upper extremity pulses, 2+ bilateral femoral pulses, 2+ bilateral dorsal pedal pulse Neuro: Alert and oriented. No facial asymmetry. No focal deficit. Moves all extremities spontaneously. Musculoskeletal: Normal muscle tone without kyphosis Psych:  Responds to questions appropriately with a normal affect.    Assessment: 77 year old male with LV systolic dysfunction previous myocardial infarction aortic valve replacement atrial fibrillation ventricular tachycardia with hepatocellular failure and pleural effusions of unknown etiology having atrial fibrillation with rapid ventricular rate needing further medication management  Plan: 1.  Discontinue warfarin due to extremely high INR 2.  Abstain from medication  management that could cause hypotension at this stage 3.  Possible reintroduction of beta-blocker if able for heart rate control with a heart rate of 100 bpm 4.  Continue supportive care for patients having episodes of issues of hepatocellular failure 5.  Further diagnostic testing and treatment options after above  Signed, Corey Skains M.D. [facility] 10/21/2018, 5:07 PM

## 2018-10-21 NOTE — ED Triage Notes (Addendum)
Pt arrived via [facility] from home with c/o [facility]. EMS states that over the last couple of days the pt has had increasing weakness and SHOB. EMS states pt also having dyspnea with exertion. EMS states skin tear to left hand due to falling a couple of days ago.

## 2018-10-21 NOTE — Consult Note (Signed)
Hematology/Oncology Consult note [facility] Telephone:([phone] [phone]  Patient Care Team: Guadalupe Maple, MD as PCP - General (Family Medicine) Murlean Iba, MD (Internal Medicine) Isaias Cowman, MD as Consulting Physician (Cardiology) Christene Lye, MD (General Surgery) Telford Nab, RN as Registered Nurse Knox Royalty, RN as [place] [facility]   Name of the patient: Kirk Jones  [id number]  [birth year]    Reason for consult: stage III lung cancer   Requesting physician: Dr. Estanislado Pandy  Date of visit: 10/21/2018    History of presenting illness- Patient is a 77 yr old male with multiple comorbidities.  History of mechanical aortic valve replacement in 2004, ventricular tachycardia status post AICD placement, systolic congestive heart failure, ischemic cardiomyopathy, severe COPD, ascending thoracic aneurysm and most recently diagnosed with stage III lung cancer.  He has been receiving concurrent chemoradiation and last received her third cycle of weekly carbotaxol 2 days ago.  At that time he was noted to have an elevated creatinine of 1.3 along with hyponatremia.  Patient has been having worsening hyponatremia along with leg swelling for the last 4 to 6 weeks.  He was admitted to the hospital last week as well with supraventricular tachycardia and was started on amiodarone at that time.  On his leg swelling he has also been on chronic Lasix.  Patient is admitted to the hospital this time with worsening shortness of breath.  Labs were notable for hyponatremia hypochloremia along with elevated creatinine of 1.8 as well as evidence of ischemic hepatitis with elevated AST and ALT and hyperkalemia with a potassium of 6.8  He is resting comfortably on O2. Does not appear significantrly sob. Denies any pain  ECOG PS- 3  Pain scale- 0   Review of systems- Review of Systems  Constitutional: Positive for  malaise/fatigue. Negative for chills, fever and weight loss.  HENT: Negative for congestion, ear discharge and nosebleeds.   Eyes: Negative for blurred vision.  Respiratory: Positive for shortness of breath. Negative for cough, hemoptysis, sputum production and wheezing.   Cardiovascular: Positive for leg swelling. Negative for chest pain, palpitations, orthopnea and claudication.  Gastrointestinal: Negative for abdominal pain, blood in stool, constipation, diarrhea, heartburn, melena, nausea and vomiting.  Genitourinary: Negative for dysuria, flank pain, frequency, hematuria and urgency.  Musculoskeletal: Negative for back pain, joint pain and myalgias.  Skin: Negative for rash.  Neurological: Negative for dizziness, tingling, focal weakness, seizures, weakness and headaches.  Endo/Heme/Allergies: Does not bruise/bleed easily.  Psychiatric/Behavioral: Negative for depression and suicidal ideas. The patient does not have insomnia.     Allergies  Allergen Reactions  . Prednisone Other (See Comments)    Reaction: "organs shutting down"    Patient Active Problem List   Diagnosis Date Noted  . Transaminasemia 10/21/2018  . Palliative care encounter   . Syncope 10/12/2018  . Pressure injury of skin 10/12/2018  . Squamous cell carcinoma of right lung ([place]) 09/16/2018  . Lung mass 08/22/2018  . Exertional shortness of breath 06/07/2018  . Peripheral edema 06/07/2018  . Allergic rhinitis 02/12/2018  . Advanced care planning/counseling discussion 10/19/2017  . Expressive aphasia 06/13/2017  . Hx of adenomatous colonic polyps 02/18/2017  . Bilateral cataracts 08/18/2016  . CAD (coronary artery disease) 10/06/2015  . Pacemaker 10/06/2015  . Automatic implantable cardioverter-defibrillator in situ 10/06/2015  . Nodular prostate with urinary obstruction 10/06/2015  . Senile purpura ([place]) 10/06/2015  . Barrett's esophagus 05/28/2015  . Hypertension 05/28/2015  . COPD (chronic  obstructive  pulmonary disease) ([place]) 05/28/2015  . Hyperlipidemia 05/28/2015  . Diverticulosis 05/28/2015  . CKD (chronic kidney disease), stage III ([place]) 05/28/2015  . Gout 05/28/2015  . History of prosthetic heart valve 05/13/2015  . Atrial fibrillation ([place]) 05/13/2015  . CVA (cerebral vascular accident) ([place]) 07/05/2014  . Systolic congestive heart failure ([place]) 07/05/2014  . History of TIA (transient ischemic attack) 02/12/2013  . Warfarin anticoagulation 02/12/2013     Past Medical History:  Diagnosis Date  . AICD (automatic cardioverter/defibrillator) present   . Aortic valve regurgitation   . Atrial fibrillation ([place])   . Barrett's esophagus   . Cancer ([place])    sate III lung  . CHF (congestive heart failure) ([place])   . COPD (chronic obstructive pulmonary disease) ([place])   . Diverticulosis   . Dysrhythmia   . GERD (gastroesophageal reflux disease)   . Gout   . Headache    aura only  . Heart murmur   . Hematuria   . Hernia of abdominal cavity   . History of prosthetic heart valve   . Hyperlipidemia   . Hypertension   . Nodular prostate with urinary obstruction   . Presence of permanent cardiac pacemaker   . Renal insufficiency   . Substance abuse ([place])    alcohol     Past Surgical History:  Procedure Laterality Date  . CARDIAC VALVE REPLACEMENT N/A 2004  . CARPAL TUNNEL RELEASE Right 2015  . CATARACT EXTRACTION Right 2017  . COLONOSCOPY WITH PROPOFOL N/A 2014  . CORONARY ANGIOPLASTY     stints x2  . EYE SURGERY     cross eyed  . FOOT SURGERY Bilateral    [place]  . FRACTURE SURGERY Right    [place]  . INGUINAL HERNIA REPAIR Right 09/16/2017   Procedure: HERNIA REPAIR INGUINAL ADULT;  Surgeon: Christene Lye, MD;  Location: [facility] ORS;  Service: General;  Laterality: Right;  . INSERT / REPLACE / [place]     ICD  . PACEMAKER IMPLANT Left 2013   and ICD  . PORTA CATH INSERTION N/A 09/21/2018   Procedure: PORTA CATH INSERTION;  Surgeon: Algernon Huxley,  MD;  Location: [facility];  Service: Cardiovascular;  Laterality: N/A;  . VASECTOMY      Social History   Socioeconomic History  . Marital status: Married    Spouse name: Belenda Cruise   . Number of children: 5  . Years of education: Not on file  . Highest education level: Not on file  Occupational History  . Not on file  Social Needs  . Financial resource strain: Not hard at all  . Food insecurity:    Worry: Never true    Inability: Never true  . Transportation needs:    Medical: No    Non-medical: No  Tobacco Use  . Smoking status: Former Smoker    Types: Cigarettes    Last attempt to quit: 04/24/1991    Years since quitting: 27.5  . Smokeless tobacco: Never Used  Substance and Sexual Activity  . Alcohol use: Yes    Alcohol/week: 2.0 standard drinks    Types: 2 Shots of liquor per week    Comment: quit December 2015/restarted-2 drinks per night  . Drug use: No  . Sexual activity: Never  Lifestyle  . Physical activity:    Days per week: 0 days    Minutes per session: 0 min  . Stress: Not at all  Relationships  . Social connections:  Talks on phone: Once a week    Gets together: Once a week    Attends religious service: More than 4 times per year    Active member of club or organization: No    Attends meetings of clubs or organizations: Never    Relationship status: Married  . Intimate partner violence:    Fear of current or ex partner: No    Emotionally abused: No    Physically abused: No    Forced sexual activity: No  Other Topics Concern  . Not on file  Social History Narrative  . Not on file     Family History  Problem Relation Age of Onset  . Heart attack Mother   . Diabetes Maternal Grandmother   . Lung cancer Sister      Current Facility-Administered Medications:  .  bisacodyl (DULCOLAX) EC tablet 5 mg, 5 mg, Oral, Daily PRN, Arta Silence, MD .  finasteride (PROSCAR) tablet 5 mg, 5 mg, Oral, Daily, Sridharan, Prasanna, MD .   insulin aspart (novoLOG) injection 0-5 Units, 0-5 Units, Subcutaneous, QHS, Sridharan, Prasanna, MD .  insulin aspart (novoLOG) injection 0-9 Units, 0-9 Units, Subcutaneous, TID WC, Sridharan, Prasanna, MD .  ondansetron (ZOFRAN) injection 4 mg, 4 mg, Intravenous, Q6H PRN, Jodell Cipro, Prasanna, MD .  pantoprazole (PROTONIX) EC tablet 40 mg, 40 mg, Oral, Daily, Sridharan, Prasanna, MD .  phytonadione (VITAMIN K) tablet 2.5 mg, 2.5 mg, Oral, Once, Arta Silence, MD, Stopped at 10/21/18 0257 .  senna-docusate (Senokot-S) tablet 1 tablet, 1 tablet, Oral, QHS PRN, Jodell Cipro, Prasanna, MD .  sodium bicarbonate 150 mEq in dextrose 5 % 1,000 mL infusion, , Intravenous, Continuous, Sridharan, Prasanna, MD, Last Rate: 100 mL/hr at 10/21/18 0820  Current Outpatient Medications:  .  acetaminophen (TYLENOL) 650 MG CR tablet, Take 650 mg by mouth 2 (two) times daily., Disp: , Rfl:  .  amiodarone (PACERONE) 200 MG tablet, Take 2 tablets (400 mg total) by mouth 2 (two) times daily., Disp: 120 tablet, Rfl: 0 .  aspirin EC 81 MG tablet, Take 81 mg by mouth daily., Disp: , Rfl:  .  COLCRYS 0.6 MG tablet, TAKE 1 TABLET BY MOUTH ONCE DAILY, Disp: 30 tablet, Rfl: 1 .  desonide (DESOWEN) 0.05 % cream, Apply topically 2 (two) times daily., Disp: 60 g, Rfl: 0 .  dexamethasone (DECADRON) 4 MG tablet, Take 2 tablets (8 mg total) by mouth daily. Start the day after chemotherapy for 2 days. (Patient not taking: Reported on 10/05/2018), Disp: 30 tablet, Rfl: 1 .  diltiazem (CARDIZEM CD) 180 MG 24 hr capsule, , Disp: , Rfl: 3 .  empagliflozin (JARDIANCE) 10 MG TABS tablet, Take 10 mg by mouth daily., Disp: , Rfl:  .  fexofenadine (ALLEGRA) 180 MG tablet, Take 180 mg by mouth daily., Disp: , Rfl:  .  finasteride (PROSCAR) 5 MG tablet, TAKE 1 TABLET BY MOUTH ONCE DAILY, Disp: 90 tablet, Rfl: 2 .  fluticasone (FLONASE) 50 MCG/ACT nasal spray, Place 2 sprays daily into both nostrils., Disp: 16 g, Rfl: 12 .  furosemide (LASIX)  40 MG tablet, Take 40 mg by mouth daily. , Disp: , Rfl:  .  ketoconazole (NIZORAL) 2 % shampoo, Apply 1 application topically 2 (two) times a week. (Patient not taking: Reported on 10/19/2018), Disp: 120 mL, Rfl: 2 .  lidocaine-prilocaine (EMLA) cream, Apply to affected area once (Patient not taking: Reported on 10/19/2018), Disp: 30 g, Rfl: 3 .  loratadine (CLARITIN) 10 MG tablet, Take 1 tablet (10 mg  total) by mouth daily. (Patient not taking: Reported on 10/12/2018), Disp: 90 tablet, Rfl: 3 .  LORazepam (ATIVAN) 0.5 MG tablet, Take 1 tablet (0.5 mg total) by mouth every 6 (six) hours as needed (Nausea or vomiting). (Patient not taking: Reported on 10/19/2018), Disp: 30 tablet, Rfl: 0 .  Magnesium 400 MG TABS, Take 400 mg by mouth daily. , Disp: , Rfl:  .  Multiple Vitamins-Minerals (CENTRUM ADULTS PO), Take 1 tablet by mouth daily. , Disp: , Rfl:  .  omeprazole (PRILOSEC) 20 MG capsule, Take 1 capsule (20 mg total) by mouth daily., Disp: 90 capsule, Rfl: 4 .  ondansetron (ZOFRAN) 8 MG tablet, Take 1 tablet (8 mg total) by mouth 2 (two) times daily as needed for refractory nausea / vomiting. Start on day 3 after chemo. (Patient not taking: Reported on 10/19/2018), Disp: 30 tablet, Rfl: 1 .  Polyethyl Glycol-Propyl Glycol (SYSTANE OP), Place 1 drop into both eyes as needed (for dry eyes)., Disp: , Rfl:  .  potassium chloride SA (K-DUR,KLOR-CON) 20 MEQ tablet, Take 1 tablet (20 mEq total) by mouth daily., Disp: 4 tablet, Rfl: 0 .  prochlorperazine (COMPAZINE) 10 MG tablet, Take 1 tablet (10 mg total) by mouth every 6 (six) hours as needed (Nausea or vomiting). (Patient not taking: Reported on 10/19/2018), Disp: 30 tablet, Rfl: 1 .  tamsulosin (FLOMAX) 0.4 MG CAPS capsule, Take 2 capsules (0.8 mg total) by mouth daily. (Patient not taking: Reported on 10/19/2018), Disp: 90 capsule, Rfl: 4 .  tiotropium (SPIRIVA) 18 MCG inhalation capsule, Place 1 capsule (18 mcg total) into inhaler and inhale daily.,  Disp: 90 capsule, Rfl: 4 .  UNABLE TO FIND, CBD Hemp topical cream, Disp: , Rfl:  .  warfarin (COUMADIN) 1 MG tablet, TAKE 1 TABLET BY MOUTH ONCE DAILY, Disp: 30 tablet, Rfl: 12 .  warfarin (COUMADIN) 3 MG tablet, TAKE 1 TABLET BY MOUTH ONCE DAILY., Disp: 30 tablet, Rfl: 2 .  warfarin (COUMADIN) 4 MG tablet, TAKE 1 TABLET BY MOUTH ONCE DAILY AT 6 P.M. (Patient not taking: Reported on 10/12/2018), Disp: 30 tablet, Rfl: 3  Facility-Administered Medications Ordered in Other Encounters:  .  0.9 %  sodium chloride infusion, , Intravenous, Continuous, Ascencion Dike, [place]   Physical exam:  Vitals:   10/21/18 0300 10/21/18 0600 10/21/18 0830 10/21/18 0900  BP: (!) 135/59 130/89 131/63 110/62  Pulse: (!) 41     Resp: 19 19 20  (!) 23  Temp:      TempSrc:      SpO2: (!) 87%     Weight:      Height:       Physical Exam  Constitutional: He is oriented to person, place, and time. He appears well-developed and well-nourished.  Appears fatigued  HENT:  Head: Normocephalic and atraumatic.  Eyes: Pupils are equal, round, and reactive to light. EOM are normal.  Neck: Normal range of motion.  Cardiovascular: Normal heart sounds.  Tachycardic, irregular  Pulmonary/Chest: Effort normal and breath sounds normal.  Abdominal: Soft. Bowel sounds are normal.  Musculoskeletal: He exhibits edema.  Neurological: He is alert and oriented to person, place, and time.  Skin: Skin is warm and dry.       CMP Latest Ref Rng & Units 10/21/2018  Glucose 70 - 99 mg/dL 127(H)  BUN 8 - 23 mg/dL -  Creatinine 0.61 - 1.24 mg/dL -  Sodium 135 - 145 mmol/L -  Potassium 3.5 - 5.1 mmol/L -  Chloride 98 - 111 mmol/L -  CO2 22 - 32 mmol/L -  Calcium 8.9 - 10.3 mg/dL -  Total Protein 6.5 - 8.1 g/dL -  Total Bilirubin 0.3 - 1.2 mg/dL -  Alkaline Phos 38 - 126 U/L -  AST 15 - 41 U/L -  ALT 0 - 44 U/L -   CBC Latest Ref Rng & Units 10/21/2018  WBC 4.0 - 10.5 K/uL 10.9(H)  Hemoglobin 13.0 - 17.0 g/dL 9.0(L)    Hematocrit 39.0 - 52.0 % 27.7(L)  Platelets 150 - 400 K/uL 135(L)    @IMAGES @  Ct Head Wo Contrast  Result Date: 10/12/2018 CLINICAL DATA:  Acute onset of dizziness and fall. Hit head on headboard. Laceration at the right temple. Patient on Coumadin. Initial encounter. EXAM: CT HEAD WITHOUT CONTRAST TECHNIQUE: Contiguous axial images were obtained from the base of the skull through the vertex without intravenous contrast. COMPARISON:  CT of the head performed 09/04/2018 FINDINGS: Brain: No evidence of acute infarction, hemorrhage, hydrocephalus, extra-axial collection or mass lesion / mass effect. Prominence of the ventricles and sulci reflects mild to moderate cortical volume loss. Cerebellar atrophy is noted. Scattered periventricular and subcortical white matter change likely reflects small vessel ischemic microangiopathy. A small chronic lacunar infarct is noted at the left cerebellar hemisphere. The brainstem and fourth ventricle are within normal limits. The basal ganglia are unremarkable in appearance. The cerebral hemispheres demonstrate grossly normal gray-white differentiation. No mass effect or midline shift is seen. Vascular: No hyperdense vessel or unexpected calcification. Skull: There is no evidence of fracture; visualized osseous structures are unremarkable in appearance. Sinuses/Orbits: The visualized portions of the orbits are within normal limits. The paranasal sinuses and mastoid air cells are well-aerated. Other: A prominent soft tissue laceration is noted anterior to the right ear, with soft tissue air extending below the right ear. IMPRESSION: 1. No evidence of traumatic intracranial injury or fracture. 2. Prominent soft tissue laceration anterior to the right ear, with soft tissue air extending below the right ear. 3. Mild to moderate cortical volume loss and scattered small vessel ischemic microangiopathy. 4. Small chronic lacunar infarct at the left cerebellar hemisphere.  Electronically Signed   By: Garald Balding M.D.   On: 10/12/2018 00:18   Ct Chest Wo Contrast  Result Date: 10/21/2018 CLINICAL DATA:  Shortness of breath and weakness. EXAM: CT CHEST WITHOUT CONTRAST TECHNIQUE: Multidetector CT imaging of the chest was performed following the standard protocol without IV contrast. COMPARISON:  08/21/2018 FINDINGS: Cardiovascular: Evaluation of vascular structures is limited without IV contrast material. Cardiac pacemaker. Diffuse cardiac enlargement. No pericardial effusions. Coronary artery calcifications. Postoperative changes in the mediastinum. Calcification throughout the aorta. Mediastinum/Nodes: Moderate prominent lymph nodes throughout the mediastinum without change since prior study, likely reactive. Lungs/Pleura: Bilateral pleural effusions, greater on the right. Atelectasis in the lung bases. Known right lower lobe mass is visible but somewhat obscured by the effusion and atelectasis. Emphysematous changes in the lungs. No pneumothorax. Airways are patent. Upper Abdomen: Probable hepatic cirrhosis. Free fluid in the right upper quadrant likely represents ascites. Musculoskeletal: Degenerative changes in the spine. No destructive bone lesions. Postoperative sternotomy wires. IMPRESSION: Known mass in the right lung base. Cardiac enlargement. Bilateral pleural effusions with basilar atelectasis, greater on the right. Mild progression on the right since previous study. Hepatic cirrhosis with upper abdominal ascites. Electronically Signed   By: Lucienne Capers M.D.   On: 10/21/2018 06:53   Dg Hand 2 View Left  Result Date: 10/12/2018 CLINICAL DATA:  Pt reports he had a  fall last night and is now experiencing pain in his left hand. Pt hand appears swollen and red at this time EXAM: LEFT HAND - 2 VIEW COMPARISON:  None. FINDINGS: IV tubing overlies the wrist. There are degenerative changes insert wrist, primarily along the radial aspect of the carpals and the first  carpometacarpal joint. There is degenerative change at the second metacarpophalangeal joint. No acute fracture or subluxation. Atherosclerotic calcification of the small arteries. IMPRESSION: No evidence for acute  abnormality.  Degenerative changes. Electronically Signed   By: Nolon Nations M.D.   On: 10/12/2018 15:27   US Venous Img Upper Uni Left  Result Date: 10/15/2018 CLINICAL DATA:  Left arm swelling. Recent fall. Left cardiac ICD. Edema from the elbow to the wrist. EXAM: LEFT UPPER EXTREMITY VENOUS DOPPLER ULTRASOUND TECHNIQUE: Gray-scale sonography with graded compression, as well as color Doppler and duplex ultrasound were performed to evaluate the upper extremity deep venous system from the level of the subclavian vein and including the jugular, axillary, basilic, radial, ulnar and upper cephalic vein. Spectral Doppler was utilized to evaluate flow at rest and with distal augmentation maneuvers. COMPARISON:  Chest radiograph 10/11/2018 FINDINGS: Contralateral Subclavian Vein: Normal respiratory phasicity. Normal color Doppler flow in the right subclavian vein. No evidence for right subclavian thrombus. Internal Jugular Vein: No evidence of thrombus. Normal compressibility, respiratory phasicity and response to augmentation. Subclavian Vein: The left cardiac ICD lead is noted. There appears to be flow around the lead and left subclavian vein appears to be patent without thrombus. Axillary Vein: No evidence of thrombus. Normal compressibility, respiratory phasicity and response to augmentation. Cephalic Vein: No evidence of thrombus. Normal compressibility, respiratory phasicity and response to augmentation. Basilic Vein: No evidence of thrombus. Normal compressibility, respiratory phasicity and response to augmentation. Brachial Veins: No evidence of thrombus. Normal compressibility, respiratory phasicity and response to augmentation. Radial Veins: No evidence of thrombus. Normal compressibility,  respiratory phasicity and response to augmentation. Single radial vein is identified. Ulnar Veins: No evidence of thrombus. Normal compressibility, respiratory phasicity and response to augmentation. Single ulnar vein is identified. Venous Reflux:  None visualized. Other Findings:  Subcutaneous edema. IMPRESSION: No evidence of DVT within the left upper extremity. Limited evaluation of the left subclavian vein due to the cardiac ICD but no evidence for thrombus in this area. Electronically Signed   By: Markus Daft M.D.   On: 10/15/2018 14:58   Ct Abdomen W Wo Contrast  Result Date: 09/25/2018 CLINICAL DATA:  Evaluate pancreatic calcifications EXAM: CT ABDOMEN WITHOUT AND WITH CONTRAST TECHNIQUE: Multidetector CT imaging of the abdomen was performed following the standard protocol before and following the bolus administration of intravenous contrast. CONTRAST:  142mL ISOVUE-300 IOPAMIDOL (ISOVUE-300) INJECTION 61% COMPARISON:  PET-CT 08/30/2018. FINDINGS: Lower chest: Large right lower lobe lung mass is again identified measuring 7.9 cm. Loculated pleural fluid is identified overlying the right midlung and right lower lobe. Small left pleural effusion noted. Aortic atherosclerosis is identified. Hepatobiliary: No focal liver abnormality. The gallbladder is not confidently identified and may be surgically absent. Pancreas: No pancreatic inflammation identified faint punctate areas of mild hyperattenuation within the head of pancreas are again identified within head of pancreas, image 39/2 and may represent small calcifications. 1.4 cm low-density structure within the head of pancreas is identified measuring 32 HU, image 96/14. This is favored to represent a mildly dilated common bile duct. This roughly corresponds to the areas of calcification within the head of pancreas which may reflect choledocholithiasis. Body and tail  of pancreas appear normal. Spleen: Spleen normal. Adrenals/Urinary Tract: Unremarkable  appearance of the adrenal glands. There is a cyst arising from inferior pole of right kidney measuring 3 cm, image 46/11. Smaller cysts noted within the upper pole of the right kidney. Multiple areas of cortical scarring involve the left kidney. Stomach/Bowel: Stomach and small bowel loops have a normal course and caliber. Extensive colonic diverticulosis noted. Vascular/Lymphatic: Aortic atherosclerosis. No aneurysm. No adenopathy identified within the abdomen. Other: Trace free fluid identified along the pericolic gutters. Musculoskeletal: Mild thoraco lumbar scoliosis and multilevel degenerative disc disease. IMPRESSION: 1. Faint calcifications within head of pancreas are again noted. There is cystic area of low attenuation within the head of pancreas which may represent a mildly dilated common bile duct. Calcifications may represent choledocholithiasis. In a patient who cannot have an MRI due to pacer device ERCP may be helpful to confirm common bile duct stones. 2. Large right lower lobe lung mass with surrounding loculated pleural fluid 3.  Aortic Atherosclerosis (ICD10-I70.0). Electronically Signed   By: Kerby Moors M.D.   On: 09/25/2018 16:27   Dg Chest Port 1 View  Result Date: 10/21/2018 CLINICAL DATA:  Shortness of breath. EXAM: PORTABLE CHEST 1 VIEW COMPARISON:  Chest x-rays dated 10/11/2018 and 08/10/2018. FINDINGS: Increased opacity at the RIGHT lung base. Probable mild atelectasis and/or small pleural effusion at the LEFT lung base. No pneumothorax seen. No acute or suspicious osseous finding. Stable cardiomegaly. RIGHT chest wall Port-A-Cath appears stable in position with tip at the level of the mid/lower SVC. LEFT chest wall pacemaker/ICD apparatus is stable in position. Median sternotomy wires appear intact and stable in alignment. IMPRESSION: 1. Increasing opacity at the RIGHT lung base. This is mostly related to the RIGHT lower lobe mass identified on recent chest CT (08/21/2018) and  PET-CT (08/30/2018), likely with superimposed pleural effusion and/or atelectasis that has slightly increased in the interval. 2. Stable cardiomegaly. 3. Probable mild atelectasis and/or small pleural effusion at the LEFT lung base. Electronically Signed   By: Franki Cabot M.D.   On: 10/21/2018 00:32   Dg Chest Port 1 View  Result Date: 10/12/2018 CLINICAL DATA:  Status post fall, with concern for chest injury. Initial encounter. EXAM: PORTABLE CHEST 1 VIEW COMPARISON:  CT of the chest performed 08/21/2018 FINDINGS: There is a persistent small right-sided pleural effusion. The known right lower lobe mass is better characterized on prior CT. There is suggestion of underlying interstitial edema, which may be transient in nature. No pneumothorax is seen. The cardiomediastinal silhouette is borderline normal in size. The patient is status post median sternotomy. An AICD is noted overlying the left chest wall, with a single lead ending overlying the right ventricle. The patient's right chest port is noted ending about the mid SVC. No displaced rib fractures are seen. IMPRESSION: 1. Persistent small right-sided pleural effusion. Known right lower lobe mass is better characterized on prior CT. Suggestion of underlying interstitial edema, which may be transient in nature. 2. No displaced rib fracture seen. Electronically Signed   By: Garald Balding M.D.   On: 10/12/2018 00:22   US Liver Doppler  Result Date: 10/21/2018 CLINICAL DATA:  Abnormal transaminases. Patient undergoing chemotherapy and radiation for stage III lung cancer. EXAM: DUPLEX ULTRASOUND OF LIVER TECHNIQUE: Color and duplex Doppler ultrasound was performed to evaluate the hepatic in-flow and out-flow vessels. COMPARISON:  CT abdomen 09/25/2018 FINDINGS: Gallbladder: The gallbladder is not definitively identified. In the gallbladder fossa, there is a structure which is could  represent a severely contracted gallbladder. No significant gallbladder  lumen is visualized. No stones are identified. Murphy's sign is negative. Common bile duct: Diameter: 4.1 mm, normal Liver: normal parenchymal echogenicity. Normal hepatic contour without nodularity. No focal lesion, mass or intrahepatic biliary ductal dilatation. Portal Vein Velocities Main:  29 cm/sec Right:  39 cm/sec Left:  116 cm/sec Main portal vein demonstrates bidirectional flow with intermittent hepatopetal and hepatofugal flow direction. Right and left portal veins demonstrate normal hepatopetal flow direction. Hepatic Vein Velocities Right:  32 cm/sec Middle:  56 cm/sec Left:  30 cm/sec Normal flow direction demonstrated in the hepatic veins. IVC: Present and patent with normal respiratory phasicity. Velocity measures 36 centimeters/second. Hepatic Artery Velocity:  130/32 cm/sec Splenic Vein Velocity:  48 cm/sec Varices: Not identified. Ascites: Minimal fluid over the liver edge. Incidental note of small right pleural effusion. IMPRESSION: 1. Gallbladder is markedly contracted, likely to be physiologic but nonspecific. No stones identified. 2. Normal flow velocities demonstrated in the portal and hepatic veins. Bidirectional flow is demonstrated in the main portal vein. Bidirectional flow may be due to Valsalva or early portal hypertension. 3. Right pleural effusion and small amount of ascites. Electronically Signed   By: Lucienne Capers M.D.   On: 10/21/2018 05:26   US Abdomen Limited Ruq  Result Date: 10/21/2018 CLINICAL DATA:  Abnormal transaminases. Patient undergoing chemotherapy and radiation for stage III lung cancer. EXAM: DUPLEX ULTRASOUND OF LIVER TECHNIQUE: Color and duplex Doppler ultrasound was performed to evaluate the hepatic in-flow and out-flow vessels. COMPARISON:  CT abdomen 09/25/2018 FINDINGS: Gallbladder: The gallbladder is not definitively identified. In the gallbladder fossa, there is a structure which is could represent a severely contracted gallbladder. No significant  gallbladder lumen is visualized. No stones are identified. Murphy's sign is negative. Common bile duct: Diameter: 4.1 mm, normal Liver: normal parenchymal echogenicity. Normal hepatic contour without nodularity. No focal lesion, mass or intrahepatic biliary ductal dilatation. Portal Vein Velocities Main:  29 cm/sec Right:  39 cm/sec Left:  116 cm/sec Main portal vein demonstrates bidirectional flow with intermittent hepatopetal and hepatofugal flow direction. Right and left portal veins demonstrate normal hepatopetal flow direction. Hepatic Vein Velocities Right:  32 cm/sec Middle:  56 cm/sec Left:  30 cm/sec Normal flow direction demonstrated in the hepatic veins. IVC: Present and patent with normal respiratory phasicity. Velocity measures 36 centimeters/second. Hepatic Artery Velocity:  130/32 cm/sec Splenic Vein Velocity:  48 cm/sec Varices: Not identified. Ascites: Minimal fluid over the liver edge. Incidental note of small right pleural effusion. IMPRESSION: 1. Gallbladder is markedly contracted, likely to be physiologic but nonspecific. No stones identified. 2. Normal flow velocities demonstrated in the portal and hepatic veins. Bidirectional flow is demonstrated in the main portal vein. Bidirectional flow may be due to Valsalva or early portal hypertension. 3. Right pleural effusion and small amount of ascites. Electronically Signed   By: Lucienne Capers M.D.   On: 10/21/2018 05:26    Assessment and plan- Patient is a 77 y.o. male with multiple comorbidities as mentioned about and recent diagnosis of stage III lung cancer admitted for worsening shortness of breath possibly secondary to right-sided heart failure given evidence of ischemic hepatitis and acute kidney injury as well as hyponatremia  Patient has multiple comorbidities and has had hyponatremia as well as worsening lower extremity edema over the last 4 to 6 weeks.  His present hospitalization has been complicated by evidence of ischemic  hepatitis as well as worsening acute kidney injury.  He did receive  lower dose of carboplatin 2 days ago along with 500 cc of normal saline but despite that his kidney functions continue to worsen.  I recommend having cardiology and nephrology on board as his underlying clinical condition is complex.his acutely abnormal LFTs are liekly due to ischemic hepatitis nto chemo induced.   From an oncology standpoint this is a second admission since starting treatment and I would therefore not be challenging him with any further chemotherapy in the future given his underlying cardiopulmonary comorbidities as well as worsening renal functions.  Hopefully after his condition improves he can proceed with palliative radiation to his chest and I may consider giving him immunotherapy down the line based on his performance status  supratherapeutic INR- no need for FFP if no clinical bleeding   Thank you for this kind referral and the opportunity to participate in the care of this patient   Visit Diagnosis 1. Generalized weakness   2. Shortness of breath   3. Acute on chronic congestive heart failure, unspecified heart failure type ([place])   4. Transaminasemia   5. Hyperkalemia   6. Supratherapeutic INR     Dr. Randa Evens, MD, MPH [facility] [phone] 10/21/2018

## 2018-10-21 NOTE — Consult Note (Signed)
Kirk Jones , MD [address] [place], [place], [place], [id number] [place], [place], [place], [address] Phone: [phone]  Fax: [phone]  Consultation  Referring Provider:   ICU Primary Care Physician:  Guadalupe Maple, MD Primary Gastroenterologist: [facility]           Reason for Consultation:     Abnormal LFT's   Date of Admission:  10/20/2018 Date of Consultation:  10/21/2018         HPI:   Kirk Jones YTKPTWS is a 77 y.o. male is a patient of [facility] last seen in May 2018.  Carries a diagnosis of Barrett's esophagus.  He has a history of CAD, A. fib, V. tach, AICD on amiodarone.  Stage III lung cancer, COPD.  He was started on amiodarone 1 week back.  He was admitted to the hospital with weakness and shortness of breath.  On admission he was an AKI with hypovolemia.  Bradycardic elevated INR] Coumadin].  I have been consulted to evaluate for abnormal liver function test.  2 days back liver  function tests were completely normal but on admission they are over 3000 in terms of AST and ALT total bilirubin 1.8 creatinine 1.97, potassium of 6.  INR of 11, hemoglobin of 9 g which is at baseline. Abdominal ultrasound right upper quadrant shows right pleural effusion small amount of ascites.  No evidence of portal vein or hepatic vein thrombosis.  Gallbladder is markedly contracted.  Common bile duct is 4.1 mm.  CT scan of the chest shows no mass in the right lung base.  There is mention hepatic cirrhosis and upper abdominal ascites.  Echocardiogram in process.  Patient says last few days not passing much water, feeling tired, dizzy when he stands up , hada fall recently, symptoms of PND, shortness of breath on minimal exertion. No NSAID's, occasional tylenol, no new medictions , OTC meds or herbal supplements.   Past Medical History:  Diagnosis Date  . AICD (automatic cardioverter/defibrillator) present   . Aortic valve regurgitation   . Atrial fibrillation  ([place])   . Barrett's esophagus   . Cancer ([place])    sate III lung  . CHF (congestive heart failure) ([place])   . COPD (chronic obstructive pulmonary disease) ([place])   . Diverticulosis   . Dysrhythmia   . GERD (gastroesophageal reflux disease)   . Gout   . Headache    aura only  . Heart murmur   . Hematuria   . Hernia of abdominal cavity   . History of prosthetic heart valve   . Hyperlipidemia   . Hypertension   . Nodular prostate with urinary obstruction   . Presence of permanent cardiac pacemaker   . Renal insufficiency   . Substance abuse ([place])    alcohol    Past Surgical History:  Procedure Laterality Date  . CARDIAC VALVE REPLACEMENT N/A 2004  . CARPAL TUNNEL RELEASE Right 2015  . CATARACT EXTRACTION Right 2017  . COLONOSCOPY WITH PROPOFOL N/A 2014  . CORONARY ANGIOPLASTY     stints x2  . EYE SURGERY     cross eyed  . FOOT SURGERY Bilateral    [place]  . FRACTURE SURGERY Right    [place]  . INGUINAL HERNIA REPAIR Right 09/16/2017   Procedure: HERNIA REPAIR INGUINAL ADULT;  Surgeon: Christene Lye, MD;  Location: [facility] ORS;  Service: General;  Laterality: Right;  . INSERT / REPLACE / [place]     ICD  .  PACEMAKER IMPLANT Left 2013   and ICD  . PORTA CATH INSERTION N/A 09/21/2018   Procedure: PORTA CATH INSERTION;  Surgeon: Algernon Huxley, MD;  Location: [facility];  Service: Cardiovascular;  Laterality: N/A;  . VASECTOMY      Prior to Admission medications   Medication Sig Start Date End Date Taking? Authorizing Provider  acetaminophen (TYLENOL) 650 MG CR tablet Take 650 mg by mouth 2 (two) times daily.    [provider]  amiodarone (PACERONE) 200 MG tablet Take 2 tablets (400 mg total) by mouth 2 (two) times daily. 10/15/18   Hillary Bow, MD  aspirin EC 81 MG tablet Take 81 mg by mouth daily.    [provider]  COLCRYS 0.6 MG tablet TAKE 1 TABLET BY MOUTH ONCE DAILY 09/13/18   Guadalupe Maple, MD  desonide (DESOWEN)  0.05 % cream Apply topically 2 (two) times daily. 03/30/18   Volney American, PA-C  dexamethasone (DECADRON) 4 MG tablet Take 2 tablets (8 mg total) by mouth daily. Start the day after chemotherapy for 2 days. Patient not taking: Reported on 10/05/2018 09/16/18   Sindy Guadeloupe, MD  diltiazem (CARDIZEM CD) 180 MG 24 hr capsule  05/06/18   [provider]  empagliflozin (JARDIANCE) 10 MG TABS tablet Take 10 mg by mouth daily.    [provider]  fexofenadine (ALLEGRA) 180 MG tablet Take 180 mg by mouth daily.    [provider]  finasteride (PROSCAR) 5 MG tablet TAKE 1 TABLET BY MOUTH ONCE DAILY 07/12/18   Volney American, PA-C  fluticasone [facility]) 50 MCG/ACT nasal spray Place 2 sprays daily into both nostrils. 10/14/17   Guadalupe Maple, MD  furosemide (LASIX) 40 MG tablet Take 40 mg by mouth daily.     [provider]  ketoconazole (NIZORAL) 2 % shampoo Apply 1 application topically 2 (two) times a week. Patient not taking: Reported on 10/19/2018 09/07/18   Volney American, PA-C  lidocaine-prilocaine (EMLA) cream Apply to affected area once Patient not taking: Reported on 10/19/2018 09/16/18   Sindy Guadeloupe, MD  loratadine (CLARITIN) 10 MG tablet Take 1 tablet (10 mg total) by mouth daily. Patient not taking: Reported on 10/12/2018 07/27/18   Volney American, PA-C  LORazepam (ATIVAN) 0.5 MG tablet Take 1 tablet (0.5 mg total) by mouth every 6 (six) hours as needed (Nausea or vomiting). Patient not taking: Reported on 10/19/2018 09/16/18   Sindy Guadeloupe, MD  Magnesium 400 MG TABS Take 400 mg by mouth daily.     [provider]  Multiple Vitamins-Minerals (CENTRUM ADULTS PO) Take 1 tablet by mouth daily.     [provider]  omeprazole (PRILOSEC) 20 MG capsule Take 1 capsule (20 mg total) by mouth daily. 10/19/17   Guadalupe Maple, MD  ondansetron (ZOFRAN) 8 MG tablet Take 1 tablet (8 mg total) by mouth 2 (two) times  daily as needed for refractory nausea / vomiting. Start on day 3 after chemo. Patient not taking: Reported on 10/19/2018 09/16/18   Sindy Guadeloupe, MD  Polyethyl Glycol-Propyl Glycol (SYSTANE OP) Place 1 drop into both eyes as needed (for dry eyes).    [provider]  potassium chloride SA (K-DUR,KLOR-CON) 20 MEQ tablet Take 1 tablet (20 mEq total) by mouth daily. 10/05/18   Sindy Guadeloupe, MD  prochlorperazine (COMPAZINE) 10 MG tablet Take 1 tablet (10 mg total) by mouth every 6 (six) hours as needed (Nausea  or vomiting). Patient not taking: Reported on 10/19/2018 09/16/18   Sindy Guadeloupe, MD  tamsulosin (FLOMAX) 0.4 MG CAPS capsule Take 2 capsules (0.8 mg total) by mouth daily. Patient not taking: Reported on 10/19/2018 07/27/18   Volney American, PA-C  tiotropium [facility]) 18 MCG inhalation capsule Place 1 capsule (18 mcg total) into inhaler and inhale daily. 10/19/17   Guadalupe Maple, MD  UNABLE TO FIND CBD Hemp topical cream    [provider]  warfarin (COUMADIN) 1 MG tablet TAKE 1 TABLET BY MOUTH ONCE DAILY 08/07/18   Johnson, Megan P, DO  warfarin (COUMADIN) 3 MG tablet TAKE 1 TABLET BY MOUTH ONCE DAILY. 06/21/18   Guadalupe Maple, MD  warfarin (COUMADIN) 4 MG tablet TAKE 1 TABLET BY MOUTH ONCE DAILY AT 6 P.M. Patient not taking: Reported on 10/12/2018 11/04/17   Guadalupe Maple, MD    Family History  Problem Relation Age of Onset  . Heart attack Mother   . Diabetes Maternal Grandmother   . Lung cancer Sister      Social History   Tobacco Use  . Smoking status: Former Smoker    Types: Cigarettes    Last attempt to quit: 04/24/1991    Years since quitting: 27.5  . Smokeless tobacco: Never Used  Substance Use Topics  . Alcohol use: Yes    Alcohol/week: 2.0 standard drinks    Types: 2 Shots of liquor per week    Comment: quit December 2015/restarted-2 drinks per night  . Drug use: No    Allergies as of 10/20/2018 - Review Complete 10/20/2018    Allergen Reaction Noted  . Prednisone Other (See Comments) 04/24/2015    Review of Systems:    All systems reviewed and negative except where noted in HPI.   Physical Exam:  Vital signs in last 24 hours: Temp:  [97.5 F (36.4 C)-97.9 F (36.6 C)] 97.9 F (36.6 C) (11/23 1124) Pulse Rate:  [41-52] 41 (11/23 0300) Resp:  [18-23] 19 (11/23 1124) BP: (108-135)/(48-102) 129/102 (11/23 1124) SpO2:  [87 %-100 %] 87 % (11/23 0300) Weight:  [72.6 kg-76.1 kg] 76.1 kg (11/23 1124) Last BM Date: 10/21/18 General:   Pleasant, cooperative in NAD Head:  Normocephalic and atraumatic. Eyes:   No icterus.   Conjunctiva pink. PERRLA. Ears:  Normal auditory acuity. Neck:  Elevated JVD - ?tricuspid regurgitation  Lungs: Respirations even and unlabored. Lungs clear to auscultation bilaterally.   No wheezes, crackles, or rhonchi.  Heart:  Regular rate and rhythm;   Abdomen:  Soft, nondistended, nontender. Normal bowel sounds. No appreciable masses or hepatomegaly.  No rebound or guarding.  Neurologic:  Alert and oriented x3;  grossly normal neurologically. Skin:  Intact without significant lesions or rashes. Cervical Nodes:  No significant cervical adenopathy. Psych:  Alert and cooperative. Normal affect.  LAB RESULTS: Recent Labs    10/19/18 0844 10/21/18 0004 10/21/18 0235  WBC 6.3 9.8 10.9*  HGB 8.9* 9.4* 9.0*  HCT 26.1* 29.0* 27.7*  PLT 244 150 135*   BMET Recent Labs    10/21/18 0004 10/21/18 0235 10/21/18 0755 10/21/18 1024  NA 129* 128*  --  132*  K 6.8* 6.3*  --  6.0*  CL 91* 91*  --  90*  CO2 20* 21*  --  23  GLUCOSE 91 98 127* 142*  BUN 40* 41*  --  44*  CREATININE 1.97* 1.84*  --  1.97*  CALCIUM 8.0* 7.8*  --  7.6*   LFT  Recent Labs    10/21/18 1024  PROT 5.4*  ALBUMIN 2.8*  AST 4,400*  ALT 2,678*  ALKPHOS 119  BILITOT 1.8*   PT/INR Recent Labs    10/21/18 0235 10/21/18 1024  LABPROT 81.7* 89.7*  INR 10.60* 11.96*    STUDIES: Ct Chest Wo  Contrast  Result Date: 10/21/2018 CLINICAL DATA:  Shortness of breath and weakness. EXAM: CT CHEST WITHOUT CONTRAST TECHNIQUE: Multidetector CT imaging of the chest was performed following the standard protocol without IV contrast. COMPARISON:  08/21/2018 FINDINGS: Cardiovascular: Evaluation of vascular structures is limited without IV contrast material. Cardiac pacemaker. Diffuse cardiac enlargement. No pericardial effusions. Coronary artery calcifications. Postoperative changes in the mediastinum. Calcification throughout the aorta. Mediastinum/Nodes: Moderate prominent lymph nodes throughout the mediastinum without change since prior study, likely reactive. Lungs/Pleura: Bilateral pleural effusions, greater on the right. Atelectasis in the lung bases. Known right lower lobe mass is visible but somewhat obscured by the effusion and atelectasis. Emphysematous changes in the lungs. No pneumothorax. Airways are patent. Upper Abdomen: Probable hepatic cirrhosis. Free fluid in the right upper quadrant likely represents ascites. Musculoskeletal: Degenerative changes in the spine. No destructive bone lesions. Postoperative sternotomy wires. IMPRESSION: Known mass in the right lung base. Cardiac enlargement. Bilateral pleural effusions with basilar atelectasis, greater on the right. Mild progression on the right since previous study. Hepatic cirrhosis with upper abdominal ascites. Electronically Signed   By: Lucienne Capers M.D.   On: 10/21/2018 06:53   Dg Chest Port 1 View  Result Date: 10/21/2018 CLINICAL DATA:  Shortness of breath. EXAM: PORTABLE CHEST 1 VIEW COMPARISON:  Chest x-rays dated 10/11/2018 and 08/10/2018. FINDINGS: Increased opacity at the RIGHT lung base. Probable mild atelectasis and/or small pleural effusion at the LEFT lung base. No pneumothorax seen. No acute or suspicious osseous finding. Stable cardiomegaly. RIGHT chest wall Port-A-Cath appears stable in position with tip at the level of the  mid/lower SVC. LEFT chest wall pacemaker/ICD apparatus is stable in position. Median sternotomy wires appear intact and stable in alignment. IMPRESSION: 1. Increasing opacity at the RIGHT lung base. This is mostly related to the RIGHT lower lobe mass identified on recent chest CT (08/21/2018) and PET-CT (08/30/2018), likely with superimposed pleural effusion and/or atelectasis that has slightly increased in the interval. 2. Stable cardiomegaly. 3. Probable mild atelectasis and/or small pleural effusion at the LEFT lung base. Electronically Signed   By: Franki Cabot M.D.   On: 10/21/2018 00:32   US Liver Doppler  Result Date: 10/21/2018 CLINICAL DATA:  Abnormal transaminases. Patient undergoing chemotherapy and radiation for stage III lung cancer. EXAM: DUPLEX ULTRASOUND OF LIVER TECHNIQUE: Color and duplex Doppler ultrasound was performed to evaluate the hepatic in-flow and out-flow vessels. COMPARISON:  CT abdomen 09/25/2018 FINDINGS: Gallbladder: The gallbladder is not definitively identified. In the gallbladder fossa, there is a structure which is could represent a severely contracted gallbladder. No significant gallbladder lumen is visualized. No stones are identified. Murphy's sign is negative. Common bile duct: Diameter: 4.1 mm, normal Liver: normal parenchymal echogenicity. Normal hepatic contour without nodularity. No focal lesion, mass or intrahepatic biliary ductal dilatation. Portal Vein Velocities Main:  29 cm/sec Right:  39 cm/sec Left:  116 cm/sec Main portal vein demonstrates bidirectional flow with intermittent hepatopetal and hepatofugal flow direction. Right and left portal veins demonstrate normal hepatopetal flow direction. Hepatic Vein Velocities Right:  32 cm/sec Middle:  56 cm/sec Left:  30 cm/sec Normal flow direction demonstrated in the hepatic veins. IVC: Present and patent with normal  respiratory phasicity. Velocity measures 36 centimeters/second. Hepatic Artery Velocity:  130/32  cm/sec Splenic Vein Velocity:  48 cm/sec Varices: Not identified. Ascites: Minimal fluid over the liver edge. Incidental note of small right pleural effusion. IMPRESSION: 1. Gallbladder is markedly contracted, likely to be physiologic but nonspecific. No stones identified. 2. Normal flow velocities demonstrated in the portal and hepatic veins. Bidirectional flow is demonstrated in the main portal vein. Bidirectional flow may be due to Valsalva or early portal hypertension. 3. Right pleural effusion and small amount of ascites. Electronically Signed   By: Lucienne Capers M.D.   On: 10/21/2018 05:26   US Abdomen Limited Ruq  Result Date: 10/21/2018 CLINICAL DATA:  Abnormal transaminases. Patient undergoing chemotherapy and radiation for stage III lung cancer. EXAM: DUPLEX ULTRASOUND OF LIVER TECHNIQUE: Color and duplex Doppler ultrasound was performed to evaluate the hepatic in-flow and out-flow vessels. COMPARISON:  CT abdomen 09/25/2018 FINDINGS: Gallbladder: The gallbladder is not definitively identified. In the gallbladder fossa, there is a structure which is could represent a severely contracted gallbladder. No significant gallbladder lumen is visualized. No stones are identified. Murphy's sign is negative. Common bile duct: Diameter: 4.1 mm, normal Liver: normal parenchymal echogenicity. Normal hepatic contour without nodularity. No focal lesion, mass or intrahepatic biliary ductal dilatation. Portal Vein Velocities Main:  29 cm/sec Right:  39 cm/sec Left:  116 cm/sec Main portal vein demonstrates bidirectional flow with intermittent hepatopetal and hepatofugal flow direction. Right and left portal veins demonstrate normal hepatopetal flow direction. Hepatic Vein Velocities Right:  32 cm/sec Middle:  56 cm/sec Left:  30 cm/sec Normal flow direction demonstrated in the hepatic veins. IVC: Present and patent with normal respiratory phasicity. Velocity measures 36 centimeters/second. Hepatic Artery Velocity:   130/32 cm/sec Splenic Vein Velocity:  48 cm/sec Varices: Not identified. Ascites: Minimal fluid over the liver edge. Incidental note of small right pleural effusion. IMPRESSION: 1. Gallbladder is markedly contracted, likely to be physiologic but nonspecific. No stones identified. 2. Normal flow velocities demonstrated in the portal and hepatic veins. Bidirectional flow is demonstrated in the main portal vein. Bidirectional flow may be due to Valsalva or early portal hypertension. 3. Right pleural effusion and small amount of ascites. Electronically Signed   By: Lucienne Capers M.D.   On: 10/21/2018 05:26      Impression / Plan:   Orvell Careaga is a 77 y.o. y/o male with a history of heart failure, lung cancer.  Recently started on amiodarone a week back .  Comes into the hospital with AKI, hyperkalemia, hyponatremia, hypo chloremia all suggesting low intravascular fluid status.  Supratherapeutic INR.  Noted to have acute rise in transaminases in the thousands.  The most likely etiology for abnormal liver function test in this patient will be ischemic hepatitis from poor perfusion of the liver.  The likely etiology for poor perfusion of the liver would be a combination of his possible baseline heart failure and a combination of hypovolemia. He has an elevated JVD upto his ear lobe suggesting possible tricuspid regurgitation and possible back pressure on the liver as well .  The other differentials would be acute viral hepatitis or medication such as amiodarone although its only on rare occasions particularly with IV amiodarone has severe hepatocellular injury been described.He does drink a bit of alcohol daily and that may also have an effect on the liver  Plan 1.  Ischemic hepatitis would most likely respond if improved the perfusion to his liver.  He will require fluids to be  adequately titrated under close monitoring probably would require a central line to determine his intravascular fluid status  and titrate appropriately. 2.  I do note acute hepatitis panel has been ordered but I would expect his transaminases to start returning back to normal within 24 to 48 hours of aggressive hydration. 3.  His INR is elevated likely due to the supratherapeutic effects of Coumadin.  Consider holding his Coumadin and if appropriate partially reversing. 4.  Suggest D/c amiodarone for now. Consider cardiology consult ?cardio renal syndrome?echo needed.   Thank you for involving me in the care of this patient.      LOS: 0 days   Kirk Bellows, MD  10/21/2018, 11:59 AM

## 2018-10-21 NOTE — Progress Notes (Signed)
[facility]  ROUNDING NOTE   Subjective:   [name] admitted [facility] ICU on 10/20/2018 for Shortness of breath [R06.02] Hyperkalemia [E87.5] Transaminasemia [R74.0] Generalized weakness [R53.1] Supratherapeutic INR [R79.1] Acute on chronic congestive heart failure, unspecified heart failure type ([place]) [I50.9]    Objective:  Vital signs in last 24 hours:  Temp:  [97.5 F (36.4 C)-97.9 F (36.6 C)] 97.9 F (36.6 C) (11/23 1124) Pulse Rate:  [41-52] 41 (11/23 0300) Resp:  [17-23] 17 (11/23 1400) BP: (108-135)/(48-102) 124/93 (11/23 1400) SpO2:  [87 %-100 %] 87 % (11/23 0300) Weight:  [72.6 kg-76.1 kg] 76.1 kg (11/23 1124)  Weight change:  Filed Weights   10/21/18 0054 10/21/18 1124  Weight: 72.6 kg 76.1 kg    Intake/Output: No intake/output data recorded.   Intake/Output this shift:  Total I/O In: 296.8 [I.V.:296.8] Out: 225 [Urine:225]  Physical Exam: General: NAD,   Head: Normocephalic, atraumatic. Moist oral mucosal membranes  Eyes: Anicteric, PERRL  Neck: Supple, trachea midline  Lungs:  Clear to auscultation  Heart: Regular rate and rhythm  Abdomen:  Soft, nontender,   Extremities:  1+ peripheral edema.  Neurologic: Nonfocal, moving all four extremities  Skin: No lesions        Basic Metabolic Panel: Recent Labs  Lab 10/19/18 0844 10/21/18 0004 10/21/18 0235 10/21/18 0755 10/21/18 1024  NA 127* 129* 128*  --  132*  K 4.4 6.8* 6.3*  --  6.0*  CL 89* 91* 91*  --  90*  CO2 29 20* 21*  --  23  GLUCOSE 156* 91 98 127* 142*  BUN 27* 40* 41*  --  44*  CREATININE 1.32* 1.97* 1.84*  --  1.97*  CALCIUM 8.6* 8.0* 7.8*  --  7.6*  MG  --   --   --  1.7  --   PHOS  --   --   --  6.3*  --     Liver Function Tests: Recent Labs  Lab 10/19/18 0844 10/21/18 0004 10/21/18 0235 10/21/18 1024  AST 31 3,898* 3,956* 4,400*  ALT 17 2,302* 2,317* 2,678*  ALKPHOS 93 128* 121 119  BILITOT 1.0 1.8* 1.8* 1.8*  PROT 6.1* 6.1* 5.8* 5.4*   ALBUMIN 2.9* 3.1* 3.2* 2.8*   Recent Labs  Lab 10/21/18 0235  LIPASE 32   Recent Labs  Lab 10/21/18 0235  AMMONIA 94*    CBC: Recent Labs  Lab 10/15/18 0448 10/19/18 0844 10/21/18 0004 10/21/18 0235 10/21/18 1337  WBC 5.0 6.3 9.8 10.9* 10.3  NEUTROABS  --  5.4 9.4* 10.5* 10.1*  HGB 8.3* 8.9* 9.4* 9.0* 8.4*  HCT 24.5* 26.1* 29.0* 27.7* 26.0*  MCV 92.8 90.3 96.3 95.8 95.9  PLT 177 244 150 135* 119*    Cardiac Enzymes: Recent Labs  Lab 10/21/18 0004 10/21/18 0235 10/21/18 0755  TROPONINI 0.07* 0.07* 0.08*    BNP: Invalid input(s): POCBNP  CBG: Recent Labs  Lab 10/15/18 0005 10/15/18 0504 10/15/18 0754 10/15/18 1225 10/21/18 1120  GLUCAP 93 99 89 116* 118*    Microbiology: Results for orders placed or performed during the hospital encounter of 10/20/18  MRSA PCR Screening     Status: None   Collection Time: 10/21/18 11:27 AM  Result Value Ref Range Status   MRSA by PCR NEGATIVE NEGATIVE Final    Comment:        The GeneXpert MRSA Assay (FDA approved for NASAL specimens only), is one component of a comprehensive MRSA colonization surveillance program. It  is not intended to diagnose MRSA infection nor to guide or monitor treatment for MRSA infections. Performed at [facility], [address] [place], [place] [address]     Coagulation Studies: Recent Labs    10/21/18 0004 10/21/18 0235 10/21/18 1024 10/21/18 1337  LABPROT 82.0* 81.7* 89.7* 89.6*  INR 10.65* 10.60* 11.96* 11.95*    Urinalysis: No results for input(s): COLORURINE, LABSPEC, PHURINE, GLUCOSEU, HGBUR, BILIRUBINUR, KETONESUR, PROTEINUR, UROBILINOGEN, NITRITE, LEUKOCYTESUR in the last 72 hours.  Invalid input(s): APPERANCEUR    Imaging: Ct Chest Wo Contrast  Result Date: 10/21/2018 CLINICAL DATA:  Shortness of breath and weakness. EXAM: CT CHEST WITHOUT CONTRAST TECHNIQUE: Multidetector CT imaging of the chest was performed following the standard protocol  without IV contrast. COMPARISON:  08/21/2018 FINDINGS: Cardiovascular: Evaluation of vascular structures is limited without IV contrast material. Cardiac pacemaker. Diffuse cardiac enlargement. No pericardial effusions. Coronary artery calcifications. Postoperative changes in the mediastinum. Calcification throughout the aorta. Mediastinum/Nodes: Moderate prominent lymph nodes throughout the mediastinum without change since prior study, likely reactive. Lungs/Pleura: Bilateral pleural effusions, greater on the right. Atelectasis in the lung bases. Known right lower lobe mass is visible but somewhat obscured by the effusion and atelectasis. Emphysematous changes in the lungs. No pneumothorax. Airways are patent. Upper Abdomen: Probable hepatic cirrhosis. Free fluid in the right upper quadrant likely represents ascites. Musculoskeletal: Degenerative changes in the spine. No destructive bone lesions. Postoperative sternotomy wires. IMPRESSION: Known mass in the right lung base. Cardiac enlargement. Bilateral pleural effusions with basilar atelectasis, greater on the right. Mild progression on the right since previous study. Hepatic cirrhosis with upper abdominal ascites. Electronically Signed   By: Lucienne Capers M.D.   On: 10/21/2018 06:53   Dg Chest Port 1 View  Result Date: 10/21/2018 CLINICAL DATA:  Shortness of breath. EXAM: PORTABLE CHEST 1 VIEW COMPARISON:  Chest x-rays dated 10/11/2018 and 08/10/2018. FINDINGS: Increased opacity at the RIGHT lung base. Probable mild atelectasis and/or small pleural effusion at the LEFT lung base. No pneumothorax seen. No acute or suspicious osseous finding. Stable cardiomegaly. RIGHT chest wall Port-A-Cath appears stable in position with tip at the level of the mid/lower SVC. LEFT chest wall pacemaker/ICD apparatus is stable in position. Median sternotomy wires appear intact and stable in alignment. IMPRESSION: 1. Increasing opacity at the RIGHT lung base. This is mostly  related to the RIGHT lower lobe mass identified on recent chest CT (08/21/2018) and PET-CT (08/30/2018), likely with superimposed pleural effusion and/or atelectasis that has slightly increased in the interval. 2. Stable cardiomegaly. 3. Probable mild atelectasis and/or small pleural effusion at the LEFT lung base. Electronically Signed   By: Franki Cabot M.D.   On: 10/21/2018 00:32   US Liver Doppler  Result Date: 10/21/2018 CLINICAL DATA:  Abnormal transaminases. Patient undergoing chemotherapy and radiation for stage III lung cancer. EXAM: DUPLEX ULTRASOUND OF LIVER TECHNIQUE: Color and duplex Doppler ultrasound was performed to evaluate the hepatic in-flow and out-flow vessels. COMPARISON:  CT abdomen 09/25/2018 FINDINGS: Gallbladder: The gallbladder is not definitively identified. In the gallbladder fossa, there is a structure which is could represent a severely contracted gallbladder. No significant gallbladder lumen is visualized. No stones are identified. Murphy's sign is negative. Common bile duct: Diameter: 4.1 mm, normal Liver: normal parenchymal echogenicity. Normal hepatic contour without nodularity. No focal lesion, mass or intrahepatic biliary ductal dilatation. Portal Vein Velocities Main:  29 cm/sec Right:  39 cm/sec Left:  116 cm/sec Main portal vein demonstrates bidirectional flow with intermittent hepatopetal and  hepatofugal flow direction. Right and left portal veins demonstrate normal hepatopetal flow direction. Hepatic Vein Velocities Right:  32 cm/sec Middle:  56 cm/sec Left:  30 cm/sec Normal flow direction demonstrated in the hepatic veins. IVC: Present and patent with normal respiratory phasicity. Velocity measures 36 centimeters/second. Hepatic Artery Velocity:  130/32 cm/sec Splenic Vein Velocity:  48 cm/sec Varices: Not identified. Ascites: Minimal fluid over the liver edge. Incidental note of small right pleural effusion. IMPRESSION: 1. Gallbladder is markedly contracted, likely  to be physiologic but nonspecific. No stones identified. 2. Normal flow velocities demonstrated in the portal and hepatic veins. Bidirectional flow is demonstrated in the main portal vein. Bidirectional flow may be due to Valsalva or early portal hypertension. 3. Right pleural effusion and small amount of ascites. Electronically Signed   By: Lucienne Capers M.D.   On: 10/21/2018 05:26   US Abdomen Limited Ruq  Result Date: 10/21/2018 CLINICAL DATA:  Abnormal transaminases. Patient undergoing chemotherapy and radiation for stage III lung cancer. EXAM: DUPLEX ULTRASOUND OF LIVER TECHNIQUE: Color and duplex Doppler ultrasound was performed to evaluate the hepatic in-flow and out-flow vessels. COMPARISON:  CT abdomen 09/25/2018 FINDINGS: Gallbladder: The gallbladder is not definitively identified. In the gallbladder fossa, there is a structure which is could represent a severely contracted gallbladder. No significant gallbladder lumen is visualized. No stones are identified. Murphy's sign is negative. Common bile duct: Diameter: 4.1 mm, normal Liver: normal parenchymal echogenicity. Normal hepatic contour without nodularity. No focal lesion, mass or intrahepatic biliary ductal dilatation. Portal Vein Velocities Main:  29 cm/sec Right:  39 cm/sec Left:  116 cm/sec Main portal vein demonstrates bidirectional flow with intermittent hepatopetal and hepatofugal flow direction. Right and left portal veins demonstrate normal hepatopetal flow direction. Hepatic Vein Velocities Right:  32 cm/sec Middle:  56 cm/sec Left:  30 cm/sec Normal flow direction demonstrated in the hepatic veins. IVC: Present and patent with normal respiratory phasicity. Velocity measures 36 centimeters/second. Hepatic Artery Velocity:  130/32 cm/sec Splenic Vein Velocity:  48 cm/sec Varices: Not identified. Ascites: Minimal fluid over the liver edge. Incidental note of small right pleural effusion. IMPRESSION: 1. Gallbladder is markedly contracted,  likely to be physiologic but nonspecific. No stones identified. 2. Normal flow velocities demonstrated in the portal and hepatic veins. Bidirectional flow is demonstrated in the main portal vein. Bidirectional flow may be due to Valsalva or early portal hypertension. 3. Right pleural effusion and small amount of ascites. Electronically Signed   By: Lucienne Capers M.D.   On: 10/21/2018 05:26     Medications:   .  sodium bicarbonate  infusion 1000 mL 50 mL/hr at 10/21/18 1345  . sodium chloride 0.9% NICU IV bolus 1,000 mL (10/21/18 1316)   . sodium chloride   Intravenous Once  . albuterol  15 mg Nebulization Once  . finasteride  5 mg Oral Daily  . insulin aspart  0-5 Units Subcutaneous QHS  . insulin aspart  0-9 Units Subcutaneous TID WC  . pantoprazole  40 mg Oral Daily  . phytonadione  2.5 mg Oral Once   bisacodyl, senna-docusate  Assessment/ Plan:  Kirk Jones is a 77 y.o. white male with lung cancer, atrial fibrillation on warfarin, aortic valve replacement, diabetes mellitus type II, hypertension, hyperlipidemia, coronary artery disease, AICD, Barrett's esophagus  1. Acute renal faiure with hyperkalemia and hyponatremia on chronic kidney disease stage III Followed by my partner, Dr. Candiss Norse - Continue bicarb gtt  2. Anemia with chronic kidney disease: hemoglobin 8.4. Appreciate  hem/onc input.  3. Hypertension with acute exacerbation of congestive heart failure.    LOS: 0 Kirk Jones 11/23/20192:51 PM

## 2018-10-21 NOTE — H&P (Addendum)
[place] [facility] [place] NAME: Kirk Jones    MR#:  [id number]  DATE OF BIRTH:  Dec 20, 1940  DATE OF ADMISSION:  10/20/2018  PRIMARY CARE PHYSICIAN: Guadalupe Maple, MD   REQUESTING/REFERRING PHYSICIAN: Paulette Blanch, MD  CHIEF COMPLAINT:   Chief Complaint  Patient presents with  . Shortness of Breath  . Weakness    HISTORY OF PRESENT ILLNESS:  Kirk Jones  is a 76 y.o. male p/w SOB and generalized weakness. He has a great many medical issues, including but not limited to ejection fraction of 50% with moderate MR, Hx [employer] AVR, T2NIDDM, HTN, HLD, CAD/MI (stent), Afib (Coumadin), VTach (s/p [employer] AICD/PPM, recently started on Amiodarone), stage III RLL lung SCC, COPD (2L [place] continuous home O2), BPH, GERD, gout. This is by no means an exhaustive list. He was started on Amiodarone ~1wk ago. He states that on Thursday (10/19/2018) evening, his O2 hose came detached from the generator. He got up to reattach the tube. He stood up and felt shaky, lightheaded and generally weak. He tried to brace himself by holding the recliner, but it reclined and he fell backwards and to his left side. He states he sustained some skin tears/injuries, which EMS cleaned and bandaged, but he refused transport to the ED. He has had SOB and continued weakness since then. Persistence of symptoms prompted hospitalization. (-) F/C/N/V/D/AP, urinary symptoms.  Admission labwork demonstrates hypovolemic hypochloremic hyponatremia, acute renal failure w/ hyperkalemia, transaminasemia (hepatocellular injury with impaired liver synthetic function, liver failure, hyperammonemia w/o encephalopathy), lactate elevation, supratherapeutic INR (w/o active bleeding). Bradycardic (HR 40s) at the time of my assessment. Though his labwork suggests that his internals are in turmoil, he appears remarkably well. He is comfortable, and is not in acute distress. He does not appear septic/toxic.  He is AAOx3, is not encephalopathic, and is able to answer my questions w/o difficulty. He tells me he used to drink 2-3 alcoholic beverages per day, but has not consumed alcohol in ~6wks. No mention of liver abnormality on 09/25/2018 CT abdomen. AST 3956, ALT 2317 (acutely increased from AST 31 and ALT 17 as of 10/19/2018). Hepatitis panel pending. Liver doppler (-) thrombosis.  PAST MEDICAL HISTORY:   Past Medical History:  Diagnosis Date  . AICD (automatic cardioverter/defibrillator) present   . Aortic valve regurgitation   . Atrial fibrillation ([place])   . Barrett's esophagus   . Cancer ([place])    sate III lung  . CHF (congestive heart failure) ([place])   . COPD (chronic obstructive pulmonary disease) ([place])   . Diverticulosis   . Dysrhythmia   . GERD (gastroesophageal reflux disease)   . Gout   . Headache    aura only  . Heart murmur   . Hematuria   . Hernia of abdominal cavity   . History of prosthetic heart valve   . Hyperlipidemia   . Hypertension   . Nodular prostate with urinary obstruction   . Presence of permanent cardiac pacemaker   . Renal insufficiency   . Substance abuse ([place])    alcohol    PAST SURGICAL HISTORY:   Past Surgical History:  Procedure Laterality Date  . CARDIAC VALVE REPLACEMENT N/A 2004  . CARPAL TUNNEL RELEASE Right 2015  . CATARACT EXTRACTION Right 2017  . COLONOSCOPY WITH PROPOFOL N/A 2014  . CORONARY ANGIOPLASTY     stints x2  . EYE SURGERY     cross eyed  . FOOT SURGERY Bilateral  [place]  . FRACTURE SURGERY Right    femur  . INGUINAL HERNIA REPAIR Right 09/16/2017   Procedure: HERNIA REPAIR INGUINAL ADULT;  Surgeon: Christene Lye, MD;  Location: [facility] ORS;  Service: General;  Laterality: Right;  . INSERT / REPLACE / [place]     ICD  . PACEMAKER IMPLANT Left 2013   and ICD  . PORTA CATH INSERTION N/A 09/21/2018   Procedure: PORTA CATH INSERTION;  Surgeon: Algernon Huxley, MD;  Location: [facility];  Service:  Cardiovascular;  Laterality: N/A;  . VASECTOMY      SOCIAL HISTORY:   Social History   Tobacco Use  . Smoking status: Former Smoker    Types: Cigarettes    Last attempt to quit: 04/24/1991    Years since quitting: 27.5  . Smokeless tobacco: Never Used  Substance Use Topics  . Alcohol use: Yes    Alcohol/week: 2.0 standard drinks    Types: 2 Shots of liquor per week    Comment: quit December 2015/restarted-2 drinks per night    FAMILY HISTORY:   Family History  Problem Relation Age of Onset  . Heart attack Mother   . Diabetes Maternal Grandmother   . Lung cancer Sister     DRUG ALLERGIES:   Allergies  Allergen Reactions  . Prednisone Other (See Comments)    Reaction: "organs shutting down"    REVIEW OF SYSTEMS:   Review of Systems  Constitutional: Positive for malaise/fatigue. Negative for chills, diaphoresis, fever and weight loss.  HENT: Negative for congestion, ear pain, hearing loss, nosebleeds, sinus pain, sore throat and tinnitus.   Eyes: Negative for blurred vision, double vision and photophobia.  Respiratory: Negative for cough, hemoptysis, sputum production, shortness of breath and wheezing.   Cardiovascular: Negative for chest pain, palpitations, orthopnea, claudication, leg swelling and PND.  Gastrointestinal: Negative for abdominal pain, blood in stool, constipation, diarrhea, heartburn, melena, nausea and vomiting.  Genitourinary: Negative for dysuria, frequency, hematuria and urgency.  Musculoskeletal: Negative for back pain, joint pain, myalgias and neck pain.  Skin: Negative for itching and rash.  Neurological: Positive for dizziness and weakness. Negative for tingling, tremors, sensory change, speech change, focal weakness, seizures, loss of consciousness and headaches.  Psychiatric/Behavioral: Negative for memory loss. The patient does not have insomnia.    MEDICATIONS AT HOME:   Prior to Admission medications   Medication Sig Start Date End  Date Taking? Authorizing Provider  acetaminophen (TYLENOL) 650 MG CR tablet Take 650 mg by mouth 2 (two) times daily.    [provider]  amiodarone (PACERONE) 200 MG tablet Take 2 tablets (400 mg total) by mouth 2 (two) times daily. 10/15/18   Hillary Bow, MD  aspirin EC 81 MG tablet Take 81 mg by mouth daily.    [provider]  COLCRYS 0.6 MG tablet TAKE 1 TABLET BY MOUTH ONCE DAILY 09/13/18   Guadalupe Maple, MD  desonide (DESOWEN) 0.05 % cream Apply topically 2 (two) times daily. 03/30/18   Volney American, PA-C  dexamethasone (DECADRON) 4 MG tablet Take 2 tablets (8 mg total) by mouth daily. Start the day after chemotherapy for 2 days. Patient not taking: Reported on 10/05/2018 09/16/18   Sindy Guadeloupe, MD  diltiazem (CARDIZEM CD) 180 MG 24 hr capsule  05/06/18   [provider]  empagliflozin (JARDIANCE) 10 MG TABS tablet Take 10 mg by mouth daily.    [provider]  fexofenadine (ALLEGRA) 180 MG tablet Take 180  mg by mouth daily.    [provider]  finasteride (PROSCAR) 5 MG tablet TAKE 1 TABLET BY MOUTH ONCE DAILY 07/12/18   Volney American, PA-C  fluticasone [facility]) 50 MCG/ACT nasal spray Place 2 sprays daily into both nostrils. 10/14/17   Guadalupe Maple, MD  furosemide (LASIX) 40 MG tablet Take 40 mg by mouth daily.     [provider]  ketoconazole (NIZORAL) 2 % shampoo Apply 1 application topically 2 (two) times a week. Patient not taking: Reported on 10/19/2018 09/07/18   Volney American, PA-C  lidocaine-prilocaine (EMLA) cream Apply to affected area once Patient not taking: Reported on 10/19/2018 09/16/18   Sindy Guadeloupe, MD  loratadine (CLARITIN) 10 MG tablet Take 1 tablet (10 mg total) by mouth daily. Patient not taking: Reported on 10/12/2018 07/27/18   Volney American, PA-C  LORazepam (ATIVAN) 0.5 MG tablet Take 1 tablet (0.5 mg total) by mouth every 6 (six) hours as needed (Nausea or  vomiting). Patient not taking: Reported on 10/19/2018 09/16/18   Sindy Guadeloupe, MD  Magnesium 400 MG TABS Take 400 mg by mouth daily.     [provider]  Multiple Vitamins-Minerals (CENTRUM ADULTS PO) Take 1 tablet by mouth daily.     [provider]  omeprazole (PRILOSEC) 20 MG capsule Take 1 capsule (20 mg total) by mouth daily. 10/19/17   Guadalupe Maple, MD  ondansetron (ZOFRAN) 8 MG tablet Take 1 tablet (8 mg total) by mouth 2 (two) times daily as needed for refractory nausea / vomiting. Start on day 3 after chemo. Patient not taking: Reported on 10/19/2018 09/16/18   Sindy Guadeloupe, MD  Polyethyl Glycol-Propyl Glycol (SYSTANE OP) Place 1 drop into both eyes as needed (for dry eyes).    [provider]  potassium chloride SA (K-DUR,KLOR-CON) 20 MEQ tablet Take 1 tablet (20 mEq total) by mouth daily. 10/05/18   Sindy Guadeloupe, MD  prochlorperazine (COMPAZINE) 10 MG tablet Take 1 tablet (10 mg total) by mouth every 6 (six) hours as needed (Nausea or vomiting). Patient not taking: Reported on 10/19/2018 09/16/18   Sindy Guadeloupe, MD  tamsulosin (FLOMAX) 0.4 MG CAPS capsule Take 2 capsules (0.8 mg total) by mouth daily. Patient not taking: Reported on 10/19/2018 07/27/18   Volney American, PA-C  tiotropium [facility]) 18 MCG inhalation capsule Place 1 capsule (18 mcg total) into inhaler and inhale daily. 10/19/17   Guadalupe Maple, MD  UNABLE TO FIND CBD Hemp topical cream    [provider]  warfarin (COUMADIN) 1 MG tablet TAKE 1 TABLET BY MOUTH ONCE DAILY 08/07/18   Johnson, Megan P, DO  warfarin (COUMADIN) 3 MG tablet TAKE 1 TABLET BY MOUTH ONCE DAILY. 06/21/18   Guadalupe Maple, MD  warfarin (COUMADIN) 4 MG tablet TAKE 1 TABLET BY MOUTH ONCE DAILY AT 6 P.M. Patient not taking: Reported on 10/12/2018 11/04/17   Guadalupe Maple, MD      VITAL SIGNS:  Blood pressure 130/89, pulse (!) 41, temperature (!) 97.5 F (36.4 C), temperature source Oral,  resp. rate 19, height 5\' 3"  (1.6 m), weight 72.6 kg, SpO2 (!) 87 %.  PHYSICAL EXAMINATION:  Physical Exam  Constitutional: He is oriented to person, place, and time. He appears well-developed and well-nourished. He is active and cooperative.  Non-toxic appearance. He has a sickly appearance. He appears ill. No distress. He is not intubated.  HENT:  Head: Normocephalic and atraumatic.  Mouth/Throat: Oropharynx  is clear and moist. No oropharyngeal exudate or posterior oropharyngeal edema.  Eyes: Conjunctivae, EOM and lids are normal. No scleral icterus.  Neck: Neck supple. No JVD present. No thyromegaly present.  Cardiovascular: Regular rhythm, S1 normal and S2 normal.  No extrasystoles are present. Bradycardia present. Exam reveals no gallop, no S3, no S4, no distant heart sounds and no friction rub.  Murmur heard.  Systolic murmur is present with a grade of 2/6. Pulmonary/Chest: Effort normal. No accessory muscle usage or stridor. No apnea, no tachypnea and no bradypnea. He is not intubated. No respiratory distress. He has decreased breath sounds in the right upper field, the right middle field, the right lower field, the left upper field, the left middle field and the left lower field. He has no wheezes. He has no rhonchi. He has rales in the right lower field.  Abdominal: Soft. He exhibits no distension. Bowel sounds are decreased. There is no tenderness. There is no rigidity, no rebound and no guarding.  Musculoskeletal: Normal range of motion. He exhibits no edema or tenderness.       Right lower leg: Normal. He exhibits no tenderness and no edema.       Left lower leg: Normal. He exhibits no tenderness and no edema.  Lymphadenopathy:    He has no cervical adenopathy.  Neurological: He is alert and oriented to person, place, and time. He is not disoriented.  Skin: Skin is warm and dry. No rash noted. He is not diaphoretic. No erythema.  Psychiatric: He has a normal mood and affect. His  speech is normal and behavior is normal. Judgment and thought content normal. His mood appears not anxious. He is not agitated. Cognition and memory are normal.   R chest port. L chest AICD/PPM. Bradycardic. LABORATORY PANEL:   CBC Recent Labs  Lab 10/21/18 0235  WBC 10.9*  HGB 9.0*  HCT 27.7*  PLT 135*   ------------------------------------------------------------------------------------------------------------------  Chemistries  Recent Labs  Lab 10/21/18 0235  NA 128*  K 6.3*  CL 91*  CO2 21*  GLUCOSE 98  BUN 41*  CREATININE 1.84*  CALCIUM 7.8*  AST 3,956*  ALT 2,317*  ALKPHOS 121  BILITOT 1.8*   ------------------------------------------------------------------------------------------------------------------  Cardiac Enzymes Recent Labs  Lab 10/21/18 0235  TROPONINI 0.07*   ------------------------------------------------------------------------------------------------------------------  RADIOLOGY:  Ct Chest Wo Contrast  Result Date: 10/21/2018 CLINICAL DATA:  Shortness of breath and weakness. EXAM: CT CHEST WITHOUT CONTRAST TECHNIQUE: Multidetector CT imaging of the chest was performed following the standard protocol without IV contrast. COMPARISON:  08/21/2018 FINDINGS: Cardiovascular: Evaluation of vascular structures is limited without IV contrast material. Cardiac pacemaker. Diffuse cardiac enlargement. No pericardial effusions. Coronary artery calcifications. Postoperative changes in the mediastinum. Calcification throughout the aorta. Mediastinum/Nodes: Moderate prominent lymph nodes throughout the mediastinum without change since prior study, likely reactive. Lungs/Pleura: Bilateral pleural effusions, greater on the right. Atelectasis in the lung bases. Known right lower lobe mass is visible but somewhat obscured by the effusion and atelectasis. Emphysematous changes in the lungs. No pneumothorax. Airways are patent. Upper Abdomen: Probable hepatic  cirrhosis. Free fluid in the right upper quadrant likely represents ascites. Musculoskeletal: Degenerative changes in the spine. No destructive bone lesions. Postoperative sternotomy wires. IMPRESSION: Known mass in the right lung base. Cardiac enlargement. Bilateral pleural effusions with basilar atelectasis, greater on the right. Mild progression on the right since previous study. Hepatic cirrhosis with upper abdominal ascites. Electronically Signed   By: Lucienne Capers M.D.   On: 10/21/2018 06:53  Dg Chest Port 1 View  Result Date: 10/21/2018 CLINICAL DATA:  Shortness of breath. EXAM: PORTABLE CHEST 1 VIEW COMPARISON:  Chest x-rays dated 10/11/2018 and 08/10/2018. FINDINGS: Increased opacity at the RIGHT lung base. Probable mild atelectasis and/or small pleural effusion at the LEFT lung base. No pneumothorax seen. No acute or suspicious osseous finding. Stable cardiomegaly. RIGHT chest wall Port-A-Cath appears stable in position with tip at the level of the mid/lower SVC. LEFT chest wall pacemaker/ICD apparatus is stable in position. Median sternotomy wires appear intact and stable in alignment. IMPRESSION: 1. Increasing opacity at the RIGHT lung base. This is mostly related to the RIGHT lower lobe mass identified on recent chest CT (08/21/2018) and PET-CT (08/30/2018), likely with superimposed pleural effusion and/or atelectasis that has slightly increased in the interval. 2. Stable cardiomegaly. 3. Probable mild atelectasis and/or small pleural effusion at the LEFT lung base. Electronically Signed   By: Franki Cabot M.D.   On: 10/21/2018 00:32   US Liver Doppler  Result Date: 10/21/2018 CLINICAL DATA:  Abnormal transaminases. Patient undergoing chemotherapy and radiation for stage III lung cancer. EXAM: DUPLEX ULTRASOUND OF LIVER TECHNIQUE: Color and duplex Doppler ultrasound was performed to evaluate the hepatic in-flow and out-flow vessels. COMPARISON:  CT abdomen 09/25/2018 FINDINGS:  Gallbladder: The gallbladder is not definitively identified. In the gallbladder fossa, there is a structure which is could represent a severely contracted gallbladder. No significant gallbladder lumen is visualized. No stones are identified. Murphy's sign is negative. Common bile duct: Diameter: 4.1 mm, normal Liver: normal parenchymal echogenicity. Normal hepatic contour without nodularity. No focal lesion, mass or intrahepatic biliary ductal dilatation. Portal Vein Velocities Main:  29 cm/sec Right:  39 cm/sec Left:  116 cm/sec Main portal vein demonstrates bidirectional flow with intermittent hepatopetal and hepatofugal flow direction. Right and left portal veins demonstrate normal hepatopetal flow direction. Hepatic Vein Velocities Right:  32 cm/sec Middle:  56 cm/sec Left:  30 cm/sec Normal flow direction demonstrated in the hepatic veins. IVC: Present and patent with normal respiratory phasicity. Velocity measures 36 centimeters/second. Hepatic Artery Velocity:  130/32 cm/sec Splenic Vein Velocity:  48 cm/sec Varices: Not identified. Ascites: Minimal fluid over the liver edge. Incidental note of small right pleural effusion. IMPRESSION: 1. Gallbladder is markedly contracted, likely to be physiologic but nonspecific. No stones identified. 2. Normal flow velocities demonstrated in the portal and hepatic veins. Bidirectional flow is demonstrated in the main portal vein. Bidirectional flow may be due to Valsalva or early portal hypertension. 3. Right pleural effusion and small amount of ascites. Electronically Signed   By: Lucienne Capers M.D.   On: 10/21/2018 05:26   US Abdomen Limited Ruq  Result Date: 10/21/2018 CLINICAL DATA:  Abnormal transaminases. Patient undergoing chemotherapy and radiation for stage III lung cancer. EXAM: DUPLEX ULTRASOUND OF LIVER TECHNIQUE: Color and duplex Doppler ultrasound was performed to evaluate the hepatic in-flow and out-flow vessels. COMPARISON:  CT abdomen 09/25/2018  FINDINGS: Gallbladder: The gallbladder is not definitively identified. In the gallbladder fossa, there is a structure which is could represent a severely contracted gallbladder. No significant gallbladder lumen is visualized. No stones are identified. Murphy's sign is negative. Common bile duct: Diameter: 4.1 mm, normal Liver: normal parenchymal echogenicity. Normal hepatic contour without nodularity. No focal lesion, mass or intrahepatic biliary ductal dilatation. Portal Vein Velocities Main:  29 cm/sec Right:  39 cm/sec Left:  116 cm/sec Main portal vein demonstrates bidirectional flow with intermittent hepatopetal and hepatofugal flow direction. Right and left portal veins demonstrate  normal hepatopetal flow direction. Hepatic Vein Velocities Right:  32 cm/sec Middle:  56 cm/sec Left:  30 cm/sec Normal flow direction demonstrated in the hepatic veins. IVC: Present and patent with normal respiratory phasicity. Velocity measures 36 centimeters/second. Hepatic Artery Velocity:  130/32 cm/sec Splenic Vein Velocity:  48 cm/sec Varices: Not identified. Ascites: Minimal fluid over the liver edge. Incidental note of small right pleural effusion. IMPRESSION: 1. Gallbladder is markedly contracted, likely to be physiologic but nonspecific. No stones identified. 2. Normal flow velocities demonstrated in the portal and hepatic veins. Bidirectional flow is demonstrated in the main portal vein. Bidirectional flow may be due to Valsalva or early portal hypertension. 3. Right pleural effusion and small amount of ascites. Electronically Signed   By: Lucienne Capers M.D.   On: 10/21/2018 05:26   IMPRESSION AND PLAN:   A/P: 76M p/w hypovolemic hypochloremic hyponatremia, acute renal failure w/ hyperkalemia, bradycardia, transaminasemia (hepatocellular injury with impaired liver synthetic function, liver failure, hyperammonemia w/o encephalopathy), lactate elevation, supratherapeutic INR (w/o active bleeding). Hypocalcemia,  hypoalbuminemia, BNP elevation, Troponin elevation, normocytic anemia. -Transaminasemia, hepatocellular injury, impaired liver synthetic function, acute liver failure, hyperammonemia w/o encephalopathy, lactate elevation, supratherapeutic INR w/o active bleeding: AST 3956, ALT 2317 (acutely increased from AST 31 and ALT 17 as of 10/19/2018). Lactate 8.1. Albumin 3.2, TProt 5.8, INR 10.6, impaired synthesis. Liver doppler (-) thrombosis. No mention of liver abnormality on 09/25/2018 CT abdomen. CT chest on present admission mentions nodularity/cirrhosis and ascites. Hepatitis panel pending. GGT, LDH pending. APhos WNL, TBili slightly elevated (1.8), (-) cholestasis/jaundice/icterus, (-) obstructive biliopathy. Findings may be due to Amiodarone toxicity. Amiodarone held. Monitor CMP. Hold Coumadin. Vitamin K 2.5mg  PO x1 (may have poor efficacy in setting of liver failure). Monitor for bleeding. Holding majority of home meds. -Hypovolemic hypochloremic hyponatremia, acute renal failure, hyperkalemia, Troponin elevation: Na+ 128, Cl- 91. Cr 1.84 (baseline 0.9-1.1). Suspect hypovolemia, prerenal AKI superimposed on underlying CKD III (2/2 DM, HTN, aged kidney). IVF. Monitor for heart failure. Urine studies (electrolytes, protein, creatinine, urea). Monitor BMP, avoid nephrotoxins. Holding majority of home meds. -Hypocalcemia: Ionized calcium. -Hypoalbuminemia: Prealbumin. -BNP elevation, Troponin elevation: Troponin elevation likely due to AKI w/ impaired renal clearance. Significance of BNP elevation unclear. Prior EF 50% (04/01/2017). Does not appear to be in clinically-decompensated acute congestive heart failure exacerbation. Echo pending. Trend Trop-I. Aspirin. Cardiac monitoring. -Normocytic anemia: Likely anemia of chronic disease. No evidence of acute blood loss at present time. -Case d/w ICU on-call attending. I was unsure as to which specialty service(s) to consult (i.e. Cardiology, GI), and I have as  such deferred expert consultation for the time-being. -Holding majority of home meds. -FEN/GI: Renal diabetic diet. -DVT PPx: SCDs, no pharmacological DVT PPx (INR 10.6). -Code status: DNR, may intubate for reversible causes of respiratory failure. -Disposition: Admission, > 2 midnights.   All the records are reviewed and case discussed with ED provider. Management plans discussed with the patient, family and they are in agreement.  CODE STATUS: DNR, may intubate for reversible causes of respiratory failure.  TOTAL TIME TAKING CARE OF THIS PATIENT: 120 minutes.    Arta Silence M.D on 10/21/2018 at 7:38 AM  Between 7am to 6pm - Pager - [phone]  After 6pm go to [email or web address] - Proofreader  [facility]  Office  [phone]  CC: Primary care physician; Guadalupe Maple, MD   Note: This dictation was prepared with Dragon dictation along with smaller phrase technology. Any transcriptional errors that result from this process  are unintentional.

## 2018-10-21 NOTE — Consult Note (Addendum)
Name: Kirk Jones [facility] MRN: [id number] DOB: December 02, 1940     CONSULTATION DATE: 10/20/2018  HISTORY OF PRESENT ILLNESS:   77 years old man with history of aortic valve replacement, diabetes mellitus, hypertension, dyslipidemia, CAD status post PCI, atrial fibrillation on Coumadin + amiodarone started a week ago, V. 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 She presented to the ED was progressive worsening of generalized weakness started 3 days ago when he fell and sustained a laceration on the right temporal area.  He was found to have markedly elevated transaminases, hyperkalemia, coagulopathy with INR 11.9 and lactic acid of 8 point.  Concern for amiodarone induced hepatotoxicity since the patient has been started on amiodarone a week ago. All history is obtained the form EMR and primary care team Patient arrived to the intensive care unit awake, tolerating nasal cannula, on a bicarb drip, denied chest pain and denied bleeding from any of the by the orifices.  PAST MEDICAL HISTORY :   has a past medical history of AICD (automatic cardioverter/defibrillator) present, Aortic valve regurgitation, Atrial fibrillation ([place]), Barrett's esophagus, Cancer ([place]), CHF (congestive heart failure) ([place]), COPD (chronic obstructive pulmonary disease) ([place]), Diverticulosis, Dysrhythmia, GERD (gastroesophageal reflux disease), Gout, Headache, Heart murmur, Hematuria, Hernia of abdominal cavity, History of prosthetic heart valve, Hyperlipidemia, Hypertension, Nodular prostate with urinary obstruction, Presence of permanent cardiac pacemaker, Renal insufficiency, and Substance abuse ([place]).  has a past surgical history that includes Carpal tunnel release (Right, 2015); Vasectomy; Cataract extraction (Right, 2017); Colonoscopy with propofol (N/A,  2014); Cardiac valve replacement (N/A, 2004); PACEMAKER IMPLANT (Left, 2013); Foot surgery (Bilateral); Eye surgery; Coronary angioplasty; Fracture surgery (Right); Insert / replace / remove pacemaker; Inguinal hernia repair (Right, 09/16/2017); and PORTA CATH INSERTION (N/A, 09/21/2018). Prior to Admission medications   Medication Sig Start Date End Date Taking? Authorizing Provider  acetaminophen (TYLENOL) 650 MG CR tablet Take 650 mg by mouth 2 (two) times daily.    [provider]  amiodarone (PACERONE) 200 MG tablet Take 2 tablets (400 mg total) by mouth 2 (two) times daily. 10/15/18   Hillary Bow, MD  aspirin EC 81 MG tablet Take 81 mg by mouth daily.    [provider]  COLCRYS 0.6 MG tablet TAKE 1 TABLET BY MOUTH ONCE DAILY 09/13/18   Guadalupe Maple, MD  desonide (DESOWEN) 0.05 % cream Apply topically 2 (two) times daily. 03/30/18   Volney American, PA-C  dexamethasone (DECADRON) 4 MG tablet Take 2 tablets (8 mg total) by mouth daily. Start the day after chemotherapy for 2 days. Patient not taking: Reported on 10/05/2018 09/16/18   Sindy Guadeloupe, MD  diltiazem (CARDIZEM CD) 180 MG 24 hr capsule  05/06/18   [provider]  empagliflozin (JARDIANCE) 10 MG TABS tablet Take 10 mg by mouth daily.    [provider]  fexofenadine (ALLEGRA) 180 MG tablet Take 180 mg by mouth daily.    [provider]  finasteride (PROSCAR) 5 MG tablet TAKE 1 TABLET BY MOUTH ONCE DAILY 07/12/18   Volney American, PA-C  fluticasone [facility]) 50 MCG/ACT nasal spray Place 2 sprays daily into both nostrils. 10/14/17   Guadalupe Maple, MD  furosemide (LASIX) 40 MG tablet Take 40 mg by mouth daily.     [provider]  ketoconazole (NIZORAL) 2 % shampoo Apply 1 application  topically 2 (two) times a week. Patient not taking: Reported on 10/19/2018 09/07/18   Volney American, PA-C  lidocaine-prilocaine (EMLA) cream Apply to affected area  once Patient not taking: Reported on 10/19/2018 09/16/18   Sindy Guadeloupe, MD  loratadine (CLARITIN) 10 MG tablet Take 1 tablet (10 mg total) by mouth daily. Patient not taking: Reported on 10/12/2018 07/27/18   Volney American, PA-C  LORazepam (ATIVAN) 0.5 MG tablet Take 1 tablet (0.5 mg total) by mouth every 6 (six) hours as needed (Nausea or vomiting). Patient not taking: Reported on 10/19/2018 09/16/18   Sindy Guadeloupe, MD  Magnesium 400 MG TABS Take 400 mg by mouth daily.     [provider]  Multiple Vitamins-Minerals (CENTRUM ADULTS PO) Take 1 tablet by mouth daily.     [provider]  omeprazole (PRILOSEC) 20 MG capsule Take 1 capsule (20 mg total) by mouth daily. 10/19/17   Guadalupe Maple, MD  ondansetron (ZOFRAN) 8 MG tablet Take 1 tablet (8 mg total) by mouth 2 (two) times daily as needed for refractory nausea / vomiting. Start on day 3 after chemo. Patient not taking: Reported on 10/19/2018 09/16/18   Sindy Guadeloupe, MD  Polyethyl Glycol-Propyl Glycol (SYSTANE OP) Place 1 drop into both eyes as needed (for dry eyes).    [provider]  potassium chloride SA (K-DUR,KLOR-CON) 20 MEQ tablet Take 1 tablet (20 mEq total) by mouth daily. 10/05/18   Sindy Guadeloupe, MD  prochlorperazine (COMPAZINE) 10 MG tablet Take 1 tablet (10 mg total) by mouth every 6 (six) hours as needed (Nausea or vomiting). Patient not taking: Reported on 10/19/2018 09/16/18   Sindy Guadeloupe, MD  tamsulosin (FLOMAX) 0.4 MG CAPS capsule Take 2 capsules (0.8 mg total) by mouth daily. Patient not taking: Reported on 10/19/2018 07/27/18   Volney American, PA-C  tiotropium [facility]) 18 MCG inhalation capsule Place 1 capsule (18 mcg total) into inhaler and inhale daily. 10/19/17   Guadalupe Maple, MD  UNABLE TO FIND CBD Hemp topical cream    [provider]  warfarin (COUMADIN) 1 MG tablet TAKE 1 TABLET BY MOUTH ONCE DAILY 08/07/18   Johnson, Megan P, DO  warfarin (COUMADIN)  3 MG tablet TAKE 1 TABLET BY MOUTH ONCE DAILY. 06/21/18   Guadalupe Maple, MD  warfarin (COUMADIN) 4 MG tablet TAKE 1 TABLET BY MOUTH ONCE DAILY AT 6 P.M. Patient not taking: Reported on 10/12/2018 11/04/17   Guadalupe Maple, MD   Allergies  Allergen Reactions  . Prednisone Other (See Comments)    Reaction: "organs shutting down"    FAMILY HISTORY:  family history includes Diabetes in his maternal grandmother; Heart attack in his mother; Lung cancer in his sister. SOCIAL HISTORY:  reports that he quit smoking about 27 years ago. His smoking use included cigarettes. He has never used smokeless tobacco. He reports that he drinks about 2.0 standard drinks of alcohol per week. He reports that he does not use drugs.  REVIEW OF SYSTEMS:   Unable to obtain due to critical illness   VITAL SIGNS: Temp:  [97.5 F (36.4 C)-97.9 F (36.6 C)] 97.9 F (36.6 C) (11/23 1124) Pulse Rate:  [41-52] 41 (11/23 0300) Resp:  [18-23] 19 (11/23 1124) BP: (108-135)/(48-102) 129/102 (11/23 1124) SpO2:  [87 %-100 %] 87 % (11/23 0300) Weight:  [72.6 kg-76.1 kg] 76.1 kg (11/23 1124)  Physical Examination:   Awake and oriented with no focal motor deficits Tolerating nasal  cannula, no distress, able to talk in full sentences, bilateral equal air entry with no adventitious sounds S1 & S2 are audible with no murmur Benign abdominal exam with feeble peristalsis Wasted extremities and no peripheral edema  ASSESSMENT / PLAN:  Acute on chronic respiratory failure baseline home O2 2 L nasal cannula per minute. -Monitor ABG, work of breathing.  DNR  AKI, hyperkalemia, hypochloremia, hyponatremia with intravascular volume depletion. -Optimize hydration, comprising agent for hyperkalemia, avoid nephrotoxins, monitor renal panel and urine output -Renal ultrasound -Renal consult if no improvement  Hepatic insufficiency with elevated transaminases, coagulopathy lactic acidemia.  Questionable amiodarone induced  liver injury. -Optimize hydration, avoid hepatotoxins -Liver ultrasound and monitor LFTs -Follow with GI consult  Coagulopathy with Coumadin toxicity and liver insufficiency. -Optimize FFP to keep INR less than 3 considering the patient status post aortic valve replacement.  No signs of active bleeding -Monitor coagulation profile  Mild elevation of troponin possibly demand versus supply mismatch.  No chest pain.  Baseline CAD status post PCI, A. fib, status post aortic valve replacement, S/P AICD placement in place resume -Monitor cardiac enzymes and echocardiogram -Consider cardiology evaluation  Stage III right lower lobe lung SCC -Consider oncology follow-up after acute phase of admission  DNR  DVT & GI prophylaxis.  Continue supportive care Family was updated with the patient progress, poor outcome can be agreed to the plan of care  Critical care time 40 minutes  15:35 Consults were reviewed and the case was discussed with the consultants. Continue to monitor Lab, hold on FFP transfusion for now. Family was updated

## 2018-10-21 NOTE — ED Provider Notes (Signed)
[facility] Emergency Department Provider Note   ____________________________________________   First MD Initiated Contact with Patient 10/21/18 0003     (approximate)  I have reviewed the triage vital signs and the nursing notes.   HISTORY  Chief Complaint Shortness of Breath and Weakness    HPI Kirk Jones is a 77 y.o. Jones brought to the ED from home via EMS with a chief complaint of shortness of breath and generalized weakness.  Patient has a complex medical history including CHF, COPD, atrial fibrillation on warfarin, indwelling AICD, known aortic aneurysm, lung cancer on carbotaxol and radiation treatment.  He was hospitalized last week for syncope and found to have ventricular tachycardia.  Presents tonight with complaints of increased shortness of breath on exertion as well as generalized weakness.  Usually on 2 L nasal cannula oxygen but has had to increase it to 3 L for comfort.  Fell last evening secondary to weakness with left hand skin tear but did not want to come for evaluation.  Tonight he is so weak that he is unable to stand.  Denies associated fever, chills, abdominal pain, nausea or vomiting.  Does endorse chest tightness with shortness of breath.   Past Medical History:  Diagnosis Date  . AICD (automatic cardioverter/defibrillator) present   . Aortic valve regurgitation   . Atrial fibrillation ([place])   . Barrett's esophagus   . Cancer ([place])    sate III lung  . CHF (congestive heart failure) ([place])   . COPD (chronic obstructive pulmonary disease) ([place])   . Diverticulosis   . Dysrhythmia   . GERD (gastroesophageal reflux disease)   . Gout   . Headache    aura only  . Heart murmur   . Hematuria   . Hernia of abdominal cavity   . History of prosthetic heart valve   . Hyperlipidemia   . Hypertension   . Nodular prostate with urinary obstruction   . Presence of permanent cardiac pacemaker   . Renal insufficiency   .  Substance abuse ([place])    alcohol    Patient Active Problem List   Diagnosis Date Noted  . Transaminasemia 10/21/2018  . Palliative care encounter   . Syncope 10/12/2018  . Pressure injury of skin 10/12/2018  . Squamous cell carcinoma of right lung ([place]) 09/16/2018  . Lung mass 08/22/2018  . Exertional shortness of breath 06/07/2018  . Peripheral edema 06/07/2018  . Allergic rhinitis 02/12/2018  . Advanced care planning/counseling discussion 10/19/2017  . Expressive aphasia 06/13/2017  . Hx of adenomatous colonic polyps 02/18/2017  . Bilateral cataracts 08/18/2016  . CAD (coronary artery disease) 10/06/2015  . Pacemaker 10/06/2015  . Automatic implantable cardioverter-defibrillator in situ 10/06/2015  . Nodular prostate with urinary obstruction 10/06/2015  . Senile purpura ([place]) 10/06/2015  . Barrett's esophagus 05/28/2015  . Hypertension 05/28/2015  . COPD (chronic obstructive pulmonary disease) ([place]) 05/28/2015  . Hyperlipidemia 05/28/2015  . Diverticulosis 05/28/2015  . CKD (chronic kidney disease), stage III ([place]) 05/28/2015  . Gout 05/28/2015  . History of prosthetic heart valve 05/13/2015  . Atrial fibrillation ([place]) 05/13/2015  . CVA (cerebral vascular accident) ([place]) 07/05/2014  . Systolic congestive heart failure ([place]) 07/05/2014  . History of TIA (transient ischemic attack) 02/12/2013  . Warfarin anticoagulation 02/12/2013    Past Surgical History:  Procedure Laterality Date  . CARDIAC VALVE REPLACEMENT N/A 2004  . CARPAL TUNNEL RELEASE Right 2015  . CATARACT EXTRACTION Right 2017  . COLONOSCOPY WITH PROPOFOL N/A  2014  . CORONARY ANGIOPLASTY     stints x2  . EYE SURGERY     cross eyed  . FOOT SURGERY Bilateral    [place]  . FRACTURE SURGERY Right    [place]  . INGUINAL HERNIA REPAIR Right 09/16/2017   Procedure: HERNIA REPAIR INGUINAL ADULT;  Surgeon: Christene Lye, MD;  Location: [facility] ORS;  Service: General;  Laterality: Right;  . INSERT /  REPLACE / [place]     ICD  . PACEMAKER IMPLANT Left 2013   and ICD  . PORTA CATH INSERTION N/A 09/21/2018   Procedure: PORTA CATH INSERTION;  Surgeon: Algernon Huxley, MD;  Location: [facility];  Service: Cardiovascular;  Laterality: N/A;  . VASECTOMY      Prior to Admission medications   Medication Sig Start Date End Date Taking? Authorizing Provider  acetaminophen (TYLENOL) 650 MG CR tablet Take 650 mg by mouth 2 (two) times daily.    [provider]  amiodarone (PACERONE) 200 MG tablet Take 2 tablets (400 mg total) by mouth 2 (two) times daily. 10/15/18   Hillary Bow, MD  aspirin EC 81 MG tablet Take 81 mg by mouth daily.    [provider]  COLCRYS 0.6 MG tablet TAKE 1 TABLET BY MOUTH ONCE DAILY 09/13/18   Guadalupe Maple, MD  desonide (DESOWEN) 0.05 % cream Apply topically 2 (two) times daily. 03/30/18   Volney American, PA-C  dexamethasone (DECADRON) 4 MG tablet Take 2 tablets (8 mg total) by mouth daily. Start the day after chemotherapy for 2 days. Patient not taking: Reported on 10/05/2018 09/16/18   Sindy Guadeloupe, MD  diltiazem (CARDIZEM CD) 180 MG 24 hr capsule  05/06/18   [provider]  empagliflozin (JARDIANCE) 10 MG TABS tablet Take 10 mg by mouth daily.    [provider]  fexofenadine (ALLEGRA) 180 MG tablet Take 180 mg by mouth daily.    [provider]  finasteride (PROSCAR) 5 MG tablet TAKE 1 TABLET BY MOUTH ONCE DAILY 07/12/18   Volney American, PA-C  fluticasone [facility]) 50 MCG/ACT nasal spray Place 2 sprays daily into both nostrils. 10/14/17   Guadalupe Maple, MD  furosemide (LASIX) 40 MG tablet Take 40 mg by mouth daily.     [provider]  ketoconazole (NIZORAL) 2 % shampoo Apply 1 application topically 2 (two) times a week. Patient not taking: Reported on 10/19/2018 09/07/18   Volney American, PA-C  lidocaine-prilocaine (EMLA) cream Apply to affected area once Patient not  taking: Reported on 10/19/2018 09/16/18   Sindy Guadeloupe, MD  loratadine (CLARITIN) 10 MG tablet Take 1 tablet (10 mg total) by mouth daily. Patient not taking: Reported on 10/12/2018 07/27/18   Volney American, PA-C  LORazepam (ATIVAN) 0.5 MG tablet Take 1 tablet (0.5 mg total) by mouth every 6 (six) hours as needed (Nausea or vomiting). Patient not taking: Reported on 10/19/2018 09/16/18   Sindy Guadeloupe, MD  Magnesium 400 MG TABS Take 400 mg by mouth daily.     [provider]  Multiple Vitamins-Minerals (CENTRUM ADULTS PO) Take 1 tablet by mouth daily.     [provider]  omeprazole (PRILOSEC) 20 MG capsule Take 1 capsule (20 mg total) by mouth daily. 10/19/17   Guadalupe Maple, MD  ondansetron (ZOFRAN) 8 MG tablet Take 1 tablet (8 mg total) by mouth 2 (two) times daily as needed for refractory nausea / vomiting. Start on day  3 after chemo. Patient not taking: Reported on 10/19/2018 09/16/18   Sindy Guadeloupe, MD  Polyethyl Glycol-Propyl Glycol (SYSTANE OP) Place 1 drop into both eyes as needed (for dry eyes).    [provider]  potassium chloride SA (K-DUR,KLOR-CON) 20 MEQ tablet Take 1 tablet (20 mEq total) by mouth daily. 10/05/18   Sindy Guadeloupe, MD  prochlorperazine (COMPAZINE) 10 MG tablet Take 1 tablet (10 mg total) by mouth every 6 (six) hours as needed (Nausea or vomiting). Patient not taking: Reported on 10/19/2018 09/16/18   Sindy Guadeloupe, MD  tamsulosin (FLOMAX) 0.4 MG CAPS capsule Take 2 capsules (0.8 mg total) by mouth daily. Patient not taking: Reported on 10/19/2018 07/27/18   Volney American, PA-C  tiotropium [facility]) 18 MCG inhalation capsule Place 1 capsule (18 mcg total) into inhaler and inhale daily. 10/19/17   Guadalupe Maple, MD  UNABLE TO FIND CBD Hemp topical cream    [provider]  warfarin (COUMADIN) 1 MG tablet TAKE 1 TABLET BY MOUTH ONCE DAILY 08/07/18   Johnson, Megan P, DO  warfarin (COUMADIN) 3 MG tablet TAKE  1 TABLET BY MOUTH ONCE DAILY. 06/21/18   Guadalupe Maple, MD  warfarin (COUMADIN) 4 MG tablet TAKE 1 TABLET BY MOUTH ONCE DAILY AT 6 P.M. Patient not taking: Reported on 10/12/2018 11/04/17   Guadalupe Maple, MD    Allergies Prednisone  Family History  Problem Relation Age of Onset  . Heart attack Mother   . Diabetes Maternal Grandmother   . Lung cancer Sister     Social History Social History   Tobacco Use  . Smoking status: Former Smoker    Types: Cigarettes    Last attempt to quit: 04/24/1991    Years since quitting: 27.5  . Smokeless tobacco: Never Used  Substance Use Topics  . Alcohol use: Yes    Alcohol/week: 2.0 standard drinks    Types: 2 Shots of liquor per week    Comment: quit December 2015/restarted-2 drinks per night  . Drug use: No    Review of Systems  Constitutional: Positive for generalized weakness.  No fever/chills Eyes: No visual changes. ENT: No sore throat. Cardiovascular: Positive for chest pain. Respiratory: Positive for shortness of breath. Gastrointestinal: No abdominal pain.  No nausea, no vomiting.  No diarrhea.  No constipation. Genitourinary: Negative for dysuria. Musculoskeletal: Negative for back pain. Skin: Negative for rash. Neurological: Negative for headaches, focal weakness or numbness.   ____________________________________________   PHYSICAL EXAM:  VITAL SIGNS: ED Triage Vitals  Enc Vitals Group     BP      Pulse      Resp      Temp      Temp src      SpO2      Weight      Height      Head Circumference      Peak Flow      Pain Score      Pain Loc      Pain Edu?      Excl. in [place]?     Constitutional: Alert and oriented.  Chronically ill appearing and in mild acute distress. Eyes: Conjunctivae are normal. PERRL. EOMI. Head: Atraumatic.  Healing laceration to right parietal scalp. Nose: No congestion/rhinnorhea. Mouth/Throat: Mucous membranes are moist.  Oropharynx non-erythematous. Neck: No stridor.     Cardiovascular: Normal rate, regular rhythm. Grossly normal heart sounds.  Good peripheral circulation. Respiratory: Normal respiratory effort.  No  retractions. Lungs with bibasilar rales. Gastrointestinal: Soft and nontender. No distention. No abdominal bruits. No CVA tenderness. Musculoskeletal: Overly tight dressing removed from left hand.  Tiny skin tear noted to dorsal hand.  2+ radial pulse.  No lower extremity tenderness. 1+ BLE pitting edema.  No joint effusions. Neurologic:  Normal speech and language. No gross focal neurologic deficits are appreciated.  Skin:  Skin is warm, dry and intact. No rash noted. Psychiatric: Mood and affect are normal. Speech and behavior are normal.  ____________________________________________   LABS (all labs ordered are listed, but only abnormal results are displayed)  Labs Reviewed  CBC WITH DIFFERENTIAL/PLATELET - Abnormal; Notable for the following components:      Result Value   RBC 3.01 (*)    Hemoglobin 9.4 (*)    HCT 29.0 (*)    RDW 15.7 (*)    nRBC 1.7 (*)    Neutro Abs 9.4 (*)    Lymphs Abs 0.1 (*)    Abs Immature Granulocytes 0.11 (*)    All other components within normal limits  COMPREHENSIVE METABOLIC PANEL - Abnormal; Notable for the following components:   Sodium 129 (*)    Potassium 6.8 (*)    Chloride 91 (*)    CO2 20 (*)    BUN 40 (*)    Creatinine, Ser 1.97 (*)    Calcium 8.0 (*)    Total Protein 6.1 (*)    Albumin 3.1 (*)    AST 3,898 (*)    ALT 2,302 (*)    Alkaline Phosphatase 128 (*)    Total Bilirubin 1.8 (*)    GFR calc non Af Amer 31 (*)    GFR calc Af Amer 36 (*)    Anion gap 18 (*)    All other components within normal limits  BRAIN NATRIURETIC PEPTIDE - Abnormal; Notable for the following components:   B Natriuretic Peptide 4,264.0 (*)    All other components within normal limits  TROPONIN I - Abnormal; Notable for the following components:   Troponin I 0.07 (*)    All other components within normal  limits  PROTIME-INR - Abnormal; Notable for the following components:   Prothrombin Time 82.0 (*)    INR 10.65 (*)    All other components within normal limits  CBC WITH DIFFERENTIAL/PLATELET - Abnormal; Notable for the following components:   WBC 10.9 (*)    RBC 2.89 (*)    Hemoglobin 9.0 (*)    HCT 27.7 (*)    RDW 15.7 (*)    Platelets 135 (*)    nRBC 2.1 (*)    Neutro Abs 10.5 (*)    Lymphs Abs 0.1 (*)    Abs Immature Granulocytes 0.13 (*)    All other components within normal limits  COMPREHENSIVE METABOLIC PANEL - Abnormal; Notable for the following components:   Sodium 128 (*)    Potassium 6.3 (*)    Chloride 91 (*)    CO2 21 (*)    BUN 41 (*)    Creatinine, Ser 1.84 (*)    Calcium 7.8 (*)    Total Protein 5.8 (*)    Albumin 3.2 (*)    AST 3,956 (*)    ALT 2,317 (*)    Total Bilirubin 1.8 (*)    GFR calc non Af Amer 34 (*)    GFR calc Af Amer 39 (*)    Anion gap 16 (*)    All other components within normal limits  ACETAMINOPHEN LEVEL -  Abnormal; Notable for the following components:   Acetaminophen (Tylenol), Serum <10 (*)    All other components within normal limits  TROPONIN I - Abnormal; Notable for the following components:   Troponin I 0.07 (*)    All other components within normal limits  LACTIC ACID, PLASMA - Abnormal; Notable for the following components:   Lactic Acid, Venous 8.1 (*)    All other components within normal limits  PROTIME-INR - Abnormal; Notable for the following components:   Prothrombin Time 81.7 (*)    INR 10.60 (*)    All other components within normal limits  AMMONIA - Abnormal; Notable for the following components:   Ammonia 94 (*)    All other components within normal limits  LIPASE, BLOOD  SALICYLATE LEVEL  LACTIC ACID, PLASMA  HEPATITIS PANEL, ACUTE  GAMMA GT  LACTATE DEHYDROGENASE  UREA NITROGEN, URINE  PROTEIN, URINE, RANDOM  NA AND K (SODIUM & POTASSIUM), RAND UR  CREATININE, URINE, RANDOM  GLUCOSE, RANDOM    MAGNESIUM  PHOSPHORUS  CALCIUM, IONIZED  PREALBUMIN  PROCALCITONIN  TSH  T4, FREE  TROPONIN I   ____________________________________________  EKG  ED ECG REPORT I, SUNG,JADE J, the attending physician, personally viewed and interpreted this ECG.   Date: 10/21/2018  EKG Time: 0006  Rate: 40  Rhythm: Paced  Axis: Paced  Intervals:Paced  ST&T Change: Paced  ____________________________________________  RADIOLOGY  ED MD interpretation: Increased opacity right lung most likely secondary to mass with pleural effusion  Official radiology report(s): Dg Chest Port 1 View  Result Date: 10/21/2018 CLINICAL DATA:  Shortness of breath. EXAM: PORTABLE CHEST 1 VIEW COMPARISON:  Chest x-rays dated 10/11/2018 and 08/10/2018. FINDINGS: Increased opacity at the RIGHT lung base. Probable mild atelectasis and/or small pleural effusion at the LEFT lung base. No pneumothorax seen. No acute or suspicious osseous finding. Stable cardiomegaly. RIGHT chest wall Port-A-Cath appears stable in position with tip at the level of the mid/lower SVC. LEFT chest wall pacemaker/ICD apparatus is stable in position. Median sternotomy wires appear intact and stable in alignment. IMPRESSION: 1. Increasing opacity at the RIGHT lung base. This is mostly related to the RIGHT lower lobe mass identified on recent chest CT (08/21/2018) and PET-CT (08/30/2018), likely with superimposed pleural effusion and/or atelectasis that has slightly increased in the interval. 2. Stable cardiomegaly. 3. Probable mild atelectasis and/or small pleural effusion at the LEFT lung base. Electronically Signed   By: Franki Cabot M.D.   On: 10/21/2018 00:32    ____________________________________________   PROCEDURES  Procedure(s) performed: None  Procedures  Critical Care performed: Yes, see critical care note(s)   CRITICAL CARE Performed by: Paulette Blanch   Total critical care time: 45 minutes  Critical care time was exclusive  of separately billable procedures and treating other patients.  Critical care was necessary to treat or prevent imminent or life-threatening deterioration.  Critical care was time spent personally by me on the following activities: development of treatment plan with patient and/or surrogate as well as nursing, discussions with consultants, evaluation of patient's response to treatment, examination of patient, obtaining history from patient or surrogate, ordering and performing treatments and interventions, ordering and review of laboratory studies, ordering and review of radiographic studies, pulse oximetry and re-evaluation of patient's condition.  ____________________________________________   INITIAL IMPRESSION / ASSESSMENT AND PLAN / ED COURSE  As part of my medical decision making, I reviewed the following data within the [place] History obtained from family, Nursing notes reviewed and  incorporated, Labs reviewed, EKG interpreted, Old chart reviewed, Radiograph reviewed, Discussed with admitting physician and Notes from prior ED visits   77 year old Jones with atrial fibrillation on warfarin, COPD, CHF on oxygen, lung cancer on chemotherapy and radiation who presents with increased shortness of breath and generalized weakness. Differential includes, but is not limited to, viral syndrome, bronchitis including COPD exacerbation, pneumonia, reactive airway disease including asthma, CHF including exacerbation with or without pulmonary/interstitial edema, pneumothorax, ACS, thoracic trauma, and pulmonary embolism.  Will obtain lab work to include BMP, troponin, INR.  Consider Lasix for suspected CHF exacerbation.  Anticipate hospitalization.   Clinical Course as of Oct 21 504  Sat Oct 21, 2018  0237 Discussed case with hospitalist services who will evaluate patient in the emergency department for admission.  Recommend obtaining full panel of new labs via patient's port.   Also recommends liver ultrasound.   [JS]    Clinical Course User Index [JS] Paulette Blanch, MD     ____________________________________________   FINAL CLINICAL IMPRESSION(S) / ED DIAGNOSES  Final diagnoses:  Generalized weakness  Shortness of breath  Acute on chronic congestive heart failure, unspecified heart failure type ([place])  Transaminasemia  Hyperkalemia  Supratherapeutic INR     ED Discharge Orders    None       Note:  This document was prepared using Dragon voice recognition software and may include unintentional dictation errors.    Paulette Blanch, MD 10/21/18 [phone]

## 2018-10-21 NOTE — Progress Notes (Signed)
[facility] [place] [facility] [place]    MR#:  [id number]  DATE OF BIRTH:  May 25, 1941  SUBJECTIVE:  CHIEF COMPLAINT:   Chief Complaint  Patient presents with  . Shortness of Breath  . Weakness  Patient seen and evaluated today Has generalized weakness No nausea or vomiting No abdominal discomfort  REVIEW OF SYSTEMS:    ROS  CONSTITUTIONAL: No documented fever. Has fatigue, weakness. No weight gain, no weight loss.  EYES: No blurry or double vision.  ENT: No tinnitus. No postnasal drip. No redness of the oropharynx.  RESPIRATORY: No cough, no wheeze, no hemoptysis. No dyspnea.  CARDIOVASCULAR: No chest pain. No orthopnea. No palpitations. No syncope.  GASTROINTESTINAL: No nausea, no vomiting or diarrhea. No abdominal pain. No melena or hematochezia.  GENITOURINARY: No dysuria or hematuria.  ENDOCRINE: No polyuria or nocturia. No heat or cold intolerance.  HEMATOLOGY: No anemia. No bruising. No bleeding.  INTEGUMENTARY: No rashes. No lesions.  MUSCULOSKELETAL: No arthritis. No swelling. No gout.  NEUROLOGIC: No numbness, tingling, or ataxia. No seizure-type activity.  PSYCHIATRIC: No anxiety. No insomnia. No ADD.   DRUG ALLERGIES:   Allergies  Allergen Reactions  . Prednisone Other (See Comments)    Reaction: "organs shutting down"    VITALS:  Blood pressure (!) 129/102, pulse (!) 41, temperature 97.9 F (36.6 C), temperature source Oral, resp. rate 19, height 5\' 3"  (1.6 m), weight 76.1 kg, SpO2 (!) 87 %.  PHYSICAL EXAMINATION:   Physical Exam  GENERAL:  77 y.o.-year-old patient lying in the bed with no acute distress.  EYES: Pupils equal, round, reactive to light and accommodation. No scleral icterus. Extraocular muscles intact.  HEENT: Head atraumatic, normocephalic. Oropharynx and nasopharynx clear.  NECK:  Supple, no jugular venous distention. No thyroid enlargement, no tenderness.  LUNGS: Normal breath sounds  bilaterally, no wheezing, rales, rhonchi. No use of accessory muscles of respiration.  CARDIOVASCULAR: S1, S2 normal. No murmurs, rubs, or gallops.  ABDOMEN: Soft, nontender, nondistended. Bowel sounds present. No organomegaly or mass.  EXTREMITIES: No cyanosis, clubbing or edema b/l.    NEUROLOGIC: Cranial nerves II through XII are intact. No focal Motor or sensory deficits b/l.   PSYCHIATRIC: The patient is alert and oriented x 3.  SKIN: No obvious rash, lesion, or ulcer.   LABORATORY PANEL:   CBC Recent Labs  Lab 10/21/18 0235  WBC 10.9*  HGB 9.0*  HCT 27.7*  PLT 135*   ------------------------------------------------------------------------------------------------------------------ Chemistries  Recent Labs  Lab 10/21/18 0755 10/21/18 1024  NA  --  132*  K  --  6.0*  CL  --  90*  CO2  --  23  GLUCOSE 127* 142*  BUN  --  44*  CREATININE  --  1.97*  CALCIUM  --  7.6*  MG 1.7  --   AST  --  4,400*  ALT  --  2,678*  ALKPHOS  --  119  BILITOT  --  1.8*   ------------------------------------------------------------------------------------------------------------------  Cardiac Enzymes Recent Labs  Lab 10/21/18 0755  TROPONINI 0.08*   ------------------------------------------------------------------------------------------------------------------  RADIOLOGY:  Ct Chest Wo Contrast  Result Date: 10/21/2018 CLINICAL DATA:  Shortness of breath and weakness. EXAM: CT CHEST WITHOUT CONTRAST TECHNIQUE: Multidetector CT imaging of the chest was performed following the standard protocol without IV contrast. COMPARISON:  08/21/2018 FINDINGS: Cardiovascular: Evaluation of vascular structures is limited without IV contrast material. Cardiac pacemaker. Diffuse cardiac enlargement. No pericardial effusions. Coronary artery calcifications. Postoperative changes in  the mediastinum. Calcification throughout the aorta. Mediastinum/Nodes: Moderate prominent lymph nodes throughout the  mediastinum without change since prior study, likely reactive. Lungs/Pleura: Bilateral pleural effusions, greater on the right. Atelectasis in the lung bases. Known right lower lobe mass is visible but somewhat obscured by the effusion and atelectasis. Emphysematous changes in the lungs. No pneumothorax. Airways are patent. Upper Abdomen: Probable hepatic cirrhosis. Free fluid in the right upper quadrant likely represents ascites. Musculoskeletal: Degenerative changes in the spine. No destructive bone lesions. Postoperative sternotomy wires. IMPRESSION: Known mass in the right lung base. Cardiac enlargement. Bilateral pleural effusions with basilar atelectasis, greater on the right. Mild progression on the right since previous study. Hepatic cirrhosis with upper abdominal ascites. Electronically Signed   By: Lucienne Capers M.D.   On: 10/21/2018 06:53   Dg Chest Port 1 View  Result Date: 10/21/2018 CLINICAL DATA:  Shortness of breath. EXAM: PORTABLE CHEST 1 VIEW COMPARISON:  Chest x-rays dated 10/11/2018 and 08/10/2018. FINDINGS: Increased opacity at the RIGHT lung base. Probable mild atelectasis and/or small pleural effusion at the LEFT lung base. No pneumothorax seen. No acute or suspicious osseous finding. Stable cardiomegaly. RIGHT chest wall Port-A-Cath appears stable in position with tip at the level of the mid/lower SVC. LEFT chest wall pacemaker/ICD apparatus is stable in position. Median sternotomy wires appear intact and stable in alignment. IMPRESSION: 1. Increasing opacity at the RIGHT lung base. This is mostly related to the RIGHT lower lobe mass identified on recent chest CT (08/21/2018) and PET-CT (08/30/2018), likely with superimposed pleural effusion and/or atelectasis that has slightly increased in the interval. 2. Stable cardiomegaly. 3. Probable mild atelectasis and/or small pleural effusion at the LEFT lung base. Electronically Signed   By: Franki Cabot M.D.   On: 10/21/2018 00:32   US  Liver Doppler  Result Date: 10/21/2018 CLINICAL DATA:  Abnormal transaminases. Patient undergoing chemotherapy and radiation for stage III lung cancer. EXAM: DUPLEX ULTRASOUND OF LIVER TECHNIQUE: Color and duplex Doppler ultrasound was performed to evaluate the hepatic in-flow and out-flow vessels. COMPARISON:  CT abdomen 09/25/2018 FINDINGS: Gallbladder: The gallbladder is not definitively identified. In the gallbladder fossa, there is a structure which is could represent a severely contracted gallbladder. No significant gallbladder lumen is visualized. No stones are identified. Murphy's sign is negative. Common bile duct: Diameter: 4.1 mm, normal Liver: normal parenchymal echogenicity. Normal hepatic contour without nodularity. No focal lesion, mass or intrahepatic biliary ductal dilatation. Portal Vein Velocities Main:  29 cm/sec Right:  39 cm/sec Left:  116 cm/sec Main portal vein demonstrates bidirectional flow with intermittent hepatopetal and hepatofugal flow direction. Right and left portal veins demonstrate normal hepatopetal flow direction. Hepatic Vein Velocities Right:  32 cm/sec Middle:  56 cm/sec Left:  30 cm/sec Normal flow direction demonstrated in the hepatic veins. IVC: Present and patent with normal respiratory phasicity. Velocity measures 36 centimeters/second. Hepatic Artery Velocity:  130/32 cm/sec Splenic Vein Velocity:  48 cm/sec Varices: Not identified. Ascites: Minimal fluid over the liver edge. Incidental note of small right pleural effusion. IMPRESSION: 1. Gallbladder is markedly contracted, likely to be physiologic but nonspecific. No stones identified. 2. Normal flow velocities demonstrated in the portal and hepatic veins. Bidirectional flow is demonstrated in the main portal vein. Bidirectional flow may be due to Valsalva or early portal hypertension. 3. Right pleural effusion and small amount of ascites. Electronically Signed   By: Lucienne Capers M.D.   On: 10/21/2018 05:26    US Abdomen Limited Ruq  Result Date: 10/21/2018  CLINICAL DATA:  Abnormal transaminases. Patient undergoing chemotherapy and radiation for stage III lung cancer. EXAM: DUPLEX ULTRASOUND OF LIVER TECHNIQUE: Color and duplex Doppler ultrasound was performed to evaluate the hepatic in-flow and out-flow vessels. COMPARISON:  CT abdomen 09/25/2018 FINDINGS: Gallbladder: The gallbladder is not definitively identified. In the gallbladder fossa, there is a structure which is could represent a severely contracted gallbladder. No significant gallbladder lumen is visualized. No stones are identified. Murphy's sign is negative. Common bile duct: Diameter: 4.1 mm, normal Liver: normal parenchymal echogenicity. Normal hepatic contour without nodularity. No focal lesion, mass or intrahepatic biliary ductal dilatation. Portal Vein Velocities Main:  29 cm/sec Right:  39 cm/sec Left:  116 cm/sec Main portal vein demonstrates bidirectional flow with intermittent hepatopetal and hepatofugal flow direction. Right and left portal veins demonstrate normal hepatopetal flow direction. Hepatic Vein Velocities Right:  32 cm/sec Middle:  56 cm/sec Left:  30 cm/sec Normal flow direction demonstrated in the hepatic veins. IVC: Present and patent with normal respiratory phasicity. Velocity measures 36 centimeters/second. Hepatic Artery Velocity:  130/32 cm/sec Splenic Vein Velocity:  48 cm/sec Varices: Not identified. Ascites: Minimal fluid over the liver edge. Incidental note of small right pleural effusion. IMPRESSION: 1. Gallbladder is markedly contracted, likely to be physiologic but nonspecific. No stones identified. 2. Normal flow velocities demonstrated in the portal and hepatic veins. Bidirectional flow is demonstrated in the main portal vein. Bidirectional flow may be due to Valsalva or early portal hypertension. 3. Right pleural effusion and small amount of ascites. Electronically Signed   By: Lucienne Capers M.D.   On:  10/21/2018 05:26     ASSESSMENT AND PLAN:   77 year old male patient with history of atrial fibrillation, aortic valve regurgitation, Barrett's esophagus, stage III lung cancer, chronic congestive heart failure, COPD, GERD, gout, hyperlipidemia, hypertension currently under hospitalist service in the ICU  -Acute liver failure probably secondary to hepatic injury Liver enzymes trending up Gastroenterology consultation Follow-up hepatitis panel Intensivist evaluation  -Acute hyperkalemia Nephrology consultation Follow-up electrolytes closely  -Acute hypocalcemia  -Hypovolemic hypochloremic hyponatremia with renal failure and hyperkalemia Nephrology consultation for further management of electrolytes   All the records are reviewed and case discussed with Care Management/Social Worker. Management plans discussed with the patient, family and they are in agreement.  CODE STATUS: DNR  DVT Prophylaxis: SCDs  TOTAL TIME TAKING CARE OF THIS PATIENT: 45 minutes.   POSSIBLE D/C IN 2 to 3 DAYS, DEPENDING ON CLINICAL CONDITION.  Saundra Shelling M.D on 10/21/2018 at 12:44 PM  Between 7am to 6pm - Pager - [phone]  After 6pm go to [email or web address] - password [email or web address] [facility]  Office  [phone]  CC: Primary care physician; Guadalupe Maple, MD  Note: This dictation was prepared with Dragon dictation along with smaller phrase technology. Any transcriptional errors that result from this process are unintentional.

## 2018-10-22 ENCOUNTER — Inpatient Hospital Stay
Admit: 2018-10-22 | Discharge: 2018-10-22 | Disposition: A | Discharge status: Home / Self Care | Payer: Medicare Other | Attending: Internal Medicine | Admitting: Internal Medicine

## 2018-10-22 DIAGNOSIS — R740 Nonspecific elevation of levels of transaminase and lactic acid dehydrogenase [LDH]: Secondary | ICD-10-CM

## 2018-10-22 LAB — COMPREHENSIVE METABOLIC PANEL
ALBUMIN: 2.8 g/dL — AB (ref 3.5–5.0)
ALBUMIN: 3 g/dL — AB (ref 3.5–5.0)
ALK PHOS: 130 U/L — AB (ref 38–126)
ALK PHOS: 138 U/L — AB (ref 38–126)
ALT: 2817 U/L — AB (ref 0–44)
ALT: 2791 U/L — ABNORMAL HIGH (ref 0–44)
ANION GAP: 12 (ref 5–15)
ANION GAP: 14 (ref 5–15)
AST: 4382 U/L — AB (ref 15–41)
AST: 3621 U/L — ABNORMAL HIGH (ref 15–41)
BILIRUBIN TOTAL: 1.5 mg/dL — AB (ref 0.3–1.2)
BUN: 46 mg/dL — AB (ref 8–23)
BUN: 47 mg/dL — ABNORMAL HIGH (ref 8–23)
CALCIUM: 6.9 mg/dL — AB (ref 8.9–10.3)
CALCIUM: 7.3 mg/dL — AB (ref 8.9–10.3)
CO2: 28 mmol/L (ref 22–32)
CO2: 29 mmol/L (ref 22–32)
CREATININE: 1.55 mg/dL — AB (ref 0.61–1.24)
Chloride: 92 mmol/L — ABNORMAL LOW (ref 98–111)
Chloride: 90 mmol/L — ABNORMAL LOW (ref 98–111)
Creatinine, Ser: 1.57 mg/dL — ABNORMAL HIGH (ref 0.61–1.24)
GFR calc Af Amer: 48 mL/min — ABNORMAL LOW (ref >60)
GFR calc non Af Amer: 41 mL/min — ABNORMAL LOW (ref >60)
GFR, EST AFRICAN AMERICAN: 47 mL/min — AB (ref >60)
GFR, EST NON AFRICAN AMERICAN: 41 mL/min — AB (ref >60)
GLUCOSE: 117 mg/dL — AB (ref 70–99)
GLUCOSE: 111 mg/dL — AB (ref 70–99)
POTASSIUM: 5.1 mmol/L (ref 3.5–5.1)
Potassium: 4.9 mmol/L (ref 3.5–5.1)
SODIUM: 132 mmol/L — AB (ref 135–145)
Sodium: 133 mmol/L — ABNORMAL LOW (ref 135–145)
TOTAL PROTEIN: 5.4 g/dL — AB (ref 6.5–8.1)
TOTAL PROTEIN: 5.5 g/dL — AB (ref 6.5–8.1)
Total Bilirubin: 2.1 mg/dL — ABNORMAL HIGH (ref 0.3–1.2)

## 2018-10-22 LAB — CBC WITH DIFFERENTIAL/PLATELET
ABS IMMATURE GRANULOCYTES: 0.14 10*3/uL — AB (ref 0.00–0.07)
Abs Immature Granulocytes: 0.15 10*3/uL — ABNORMAL HIGH (ref 0.00–0.07)
Abs Immature Granulocytes: 0.12 10*3/uL — ABNORMAL HIGH (ref 0.00–0.07)
BASOS ABS: 0 10*3/uL (ref 0.0–0.1)
BASOS PCT: 0 %
Basophils Absolute: 0 10*3/uL (ref 0.0–0.1)
Basophils Absolute: 0 10*3/uL (ref 0.0–0.1)
Basophils Relative: 0 %
Basophils Relative: 0 %
EOS ABS: 0 10*3/uL (ref 0.0–0.5)
EOS PCT: 0 %
EOS PCT: 0 %
Eosinophils Absolute: 0 10*3/uL (ref 0.0–0.5)
Eosinophils Absolute: 0 10*3/uL (ref 0.0–0.5)
Eosinophils Relative: 0 %
HCT: 26.3 % — ABNORMAL LOW (ref 39.0–52.0)
HCT: 27.8 % — ABNORMAL LOW (ref 39.0–52.0)
HEMATOCRIT: 26.4 % — AB (ref 39.0–52.0)
HEMOGLOBIN: 9 g/dL — AB (ref 13.0–17.0)
HEMOGLOBIN: 8.7 g/dL — AB (ref 13.0–17.0)
Hemoglobin: 9.3 g/dL — ABNORMAL LOW (ref 13.0–17.0)
IMMATURE GRANULOCYTES: 1 %
IMMATURE GRANULOCYTES: 1 %
Immature Granulocytes: 1 %
LYMPHS ABS: 0.1 10*3/uL — AB (ref 0.7–4.0)
LYMPHS ABS: 0 10*3/uL — AB (ref 0.7–4.0)
LYMPHS PCT: 0 %
LYMPHS PCT: 1 %
Lymphocytes Relative: 0 %
Lymphs Abs: 0 10*3/uL — ABNORMAL LOW (ref 0.7–4.0)
MCH: 31.5 pg (ref 26.0–34.0)
MCH: 31 pg (ref 26.0–34.0)
MCH: 31 pg (ref 26.0–34.0)
MCHC: 34.2 g/dL (ref 30.0–36.0)
MCHC: 33.5 g/dL (ref 30.0–36.0)
MCHC: 33 g/dL (ref 30.0–36.0)
MCV: 92 fL (ref 80.0–100.0)
MCV: 92.7 fL (ref 80.0–100.0)
MCV: 94 fL (ref 80.0–100.0)
MONO ABS: 0 10*3/uL — AB (ref 0.1–1.0)
MONO ABS: 0 10*3/uL — AB (ref 0.1–1.0)
MONOS PCT: 0 %
Monocytes Absolute: 0 10*3/uL — ABNORMAL LOW (ref 0.1–1.0)
Monocytes Relative: 0 %
Monocytes Relative: 0 %
NEUTROS ABS: 9.7 10*3/uL — AB (ref 1.7–7.7)
NEUTROS PCT: 99 %
NEUTROS PCT: 98 %
NRBC: 1.2 % — AB (ref 0.0–0.2)
Neutro Abs: 10.9 10*3/uL — ABNORMAL HIGH (ref 1.7–7.7)
Neutro Abs: 11.4 10*3/uL — ABNORMAL HIGH (ref 1.7–7.7)
Neutrophils Relative %: 99 %
PLATELETS: 117 10*3/uL — AB (ref 150–400)
Platelets: 117 10*3/uL — ABNORMAL LOW (ref 150–400)
Platelets: 114 10*3/uL — ABNORMAL LOW (ref 150–400)
RBC: 2.86 MIL/uL — AB (ref 4.22–5.81)
RBC: 3 MIL/uL — ABNORMAL LOW (ref 4.22–5.81)
RBC: 2.81 MIL/uL — ABNORMAL LOW (ref 4.22–5.81)
RDW: 15.8 % — ABNORMAL HIGH (ref 11.5–15.5)
RDW: 16.1 % — ABNORMAL HIGH (ref 11.5–15.5)
RDW: 16 % — AB (ref 11.5–15.5)
WBC: 9.9 10*3/uL (ref 4.0–10.5)
WBC: 11.1 10*3/uL — AB (ref 4.0–10.5)
WBC: 11.6 10*3/uL — ABNORMAL HIGH (ref 4.0–10.5)
nRBC: 2.3 % — ABNORMAL HIGH (ref 0.0–0.2)
nRBC: 1 % — ABNORMAL HIGH (ref 0.0–0.2)

## 2018-10-22 LAB — BLOOD GAS, VENOUS
ACID-BASE DEFICIT: 9.5 mmol/L — AB (ref 0.0–2.0)
ACID-BASE DEFICIT: 10.9 mmol/L — AB (ref 0.0–2.0)
ACID-BASE EXCESS: 6.9 mmol/L — AB (ref 0.0–2.0)
ACID-BASE EXCESS: 7 mmol/L — AB (ref 0.0–2.0)
BICARBONATE: 12.4 mmol/L — AB (ref 20.0–28.0)
Bicarbonate: 31.3 mmol/L — ABNORMAL HIGH (ref 20.0–28.0)
Bicarbonate: 32.8 mmol/L — ABNORMAL HIGH (ref 20.0–28.0)
Bicarbonate: 13.9 mmol/L — ABNORMAL LOW (ref 20.0–28.0)
O2 SAT: 63.2 %
O2 Saturation: 76.3 %
O2 Saturation: 72.1 %
O2 Saturation: 67.8 %
PATIENT TEMPERATURE: 37
PATIENT TEMPERATURE: 37
PATIENT TEMPERATURE: 37
PCO2 VEN: 43 mmHg — AB (ref 44.0–60.0)
PH VEN: 7.4 (ref 7.250–7.430)
PO2 VEN: 34 mmHg (ref 32.0–45.0)
Patient temperature: 37
pCO2, Ven: 53 mmHg (ref 44.0–60.0)
pCO2, Ven: 24 mmHg — ABNORMAL LOW (ref 44.0–60.0)
pCO2, Ven: 22 mmHg — ABNORMAL LOW (ref 44.0–60.0)
pH, Ven: 7.47 — ABNORMAL HIGH (ref 7.250–7.430)
pH, Ven: 7.37 (ref 7.250–7.430)
pH, Ven: 7.36 (ref 7.250–7.430)
pO2, Ven: 38 mmHg (ref 32.0–45.0)
pO2, Ven: 38 mmHg (ref 32.0–45.0)
pO2, Ven: 37 mmHg (ref 32.0–45.0)

## 2018-10-22 LAB — PROTIME-INR
INR: 7.26
INR: 5.13
INR: 4.67
PROTHROMBIN TIME: 60.9 s — AB (ref 11.4–15.2)
PROTHROMBIN TIME: 46.5 s — AB (ref 11.4–15.2)
PROTHROMBIN TIME: 43.2 s — AB (ref 11.4–15.2)

## 2018-10-22 LAB — GLUCOSE, CAPILLARY
GLUCOSE-CAPILLARY: 115 mg/dL — AB (ref 70–99)
GLUCOSE-CAPILLARY: 126 mg/dL — AB (ref 70–99)
Glucose-Capillary: 102 mg/dL — ABNORMAL HIGH (ref 70–99)
Glucose-Capillary: 119 mg/dL — ABNORMAL HIGH (ref 70–99)

## 2018-10-22 LAB — BASIC METABOLIC PANEL
ANION GAP: 10 (ref 5–15)
BUN: 50 mg/dL — ABNORMAL HIGH (ref 8–23)
CALCIUM: 7.1 mg/dL — AB (ref 8.9–10.3)
CO2: 30 mmol/L (ref 22–32)
Chloride: 91 mmol/L — ABNORMAL LOW (ref 98–111)
Creatinine, Ser: 1.46 mg/dL — ABNORMAL HIGH (ref 0.61–1.24)
GFR, EST AFRICAN AMERICAN: 52 mL/min — AB (ref >60)
GFR, EST NON AFRICAN AMERICAN: 45 mL/min — AB (ref >60)
GLUCOSE: 141 mg/dL — AB (ref 70–99)
POTASSIUM: 4.9 mmol/L (ref 3.5–5.1)
SODIUM: 131 mmol/L — AB (ref 135–145)

## 2018-10-22 LAB — MAGNESIUM
MAGNESIUM: 1.7 mg/dL (ref 1.7–2.4)
MAGNESIUM: 1.7 mg/dL (ref 1.7–2.4)
Magnesium: 1.7 mg/dL (ref 1.7–2.4)

## 2018-10-22 LAB — PHOSPHORUS
PHOSPHORUS: 5.6 mg/dL — AB (ref 2.5–4.6)
Phosphorus: 5.3 mg/dL — ABNORMAL HIGH (ref 2.5–4.6)
Phosphorus: 4.7 mg/dL — ABNORMAL HIGH (ref 2.5–4.6)

## 2018-10-22 LAB — APTT
APTT: 35 s (ref 24–36)
APTT: 34 s (ref 24–36)
aPTT: 35 seconds (ref 24–36)

## 2018-10-22 LAB — PREALBUMIN: Prealbumin: 6.6 mg/dL — ABNORMAL LOW (ref 18–38)

## 2018-10-22 MED ORDER — SODIUM CHLORIDE 0.9 % IV SOLN
INTRAVENOUS | Status: DC
  Administered 2018-10-22: 14:00:00 via INTRAVENOUS

## 2018-10-22 NOTE — Progress Notes (Signed)
[facility] [place] [facility] [place]    MR#:  [id number]  DATE OF BIRTH:  Nov 14, 1941  SUBJECTIVE:  CHIEF COMPLAINT:   Chief Complaint  Patient presents with  . Shortness of Breath  . Weakness  Patient seen and evaluated today Has generalized weakness No nausea or vomiting No abdominal discomfort On oxygen via nasal cannula No complaints of chest pain  REVIEW OF SYSTEMS:    ROS  CONSTITUTIONAL: No documented fever. Has fatigue, weakness. No weight gain, no weight loss.  EYES: No blurry or double vision.  ENT: No tinnitus. No postnasal drip. No redness of the oropharynx.  RESPIRATORY: No cough, no wheeze, no hemoptysis. No dyspnea.  CARDIOVASCULAR: No chest pain. No orthopnea. No palpitations. No syncope.  GASTROINTESTINAL: No nausea, no vomiting or diarrhea. No abdominal pain. No melena or hematochezia.  GENITOURINARY: No dysuria or hematuria.  ENDOCRINE: No polyuria or nocturia. No heat or cold intolerance.  HEMATOLOGY: No anemia. No bruising. No bleeding.  INTEGUMENTARY: No rashes. No lesions.  MUSCULOSKELETAL: No arthritis. No swelling. No gout.  NEUROLOGIC: No numbness, tingling, or ataxia. No seizure-type activity.  PSYCHIATRIC: No anxiety. No insomnia. No ADD.   DRUG ALLERGIES:   Allergies  Allergen Reactions  . Prednisone Other (See Comments)    Reaction: "organs shutting down"    VITALS:  Blood pressure 127/83, pulse (!) 135, temperature 97.7 F (36.5 C), temperature source Oral, resp. rate 16, height 5\' 3"  (1.6 m), weight 76.3 kg, SpO2 91 %.  PHYSICAL EXAMINATION:   Physical Exam  GENERAL:  77 y.o.-year-old patient lying in the bed with no acute distress.  EYES: Pupils equal, round, reactive to light and accommodation. No scleral icterus. Extraocular muscles intact.  HEENT: Head atraumatic, normocephalic. Oropharynx and nasopharynx clear.  NECK:  Supple, no jugular venous distention. No thyroid enlargement,  no tenderness.  LUNGS : Decreased breath sounds bilaterally, basal crepitations heard. No use of accessory muscles of respiration.  CARDIOVASCULAR: S1, S2 irregular. No murmurs, rubs, or gallops.  ABDOMEN: Soft, nontender, nondistended. Bowel sounds present. No organomegaly or mass.  EXTREMITIES: No cyanosis, clubbing or edema b/l.    NEUROLOGIC: Cranial nerves II through XII are intact. No focal Motor or sensory deficits b/l.   PSYCHIATRIC: The patient is alert and oriented x 3.  SKIN: No obvious rash, lesion, or ulcer.   LABORATORY PANEL:   CBC Recent Labs  Lab 10/22/18 0724  WBC 11.1*  HGB 9.3*  HCT 27.8*  PLT 117*   ------------------------------------------------------------------------------------------------------------------ Chemistries  Recent Labs  Lab 10/22/18 0724  NA 133*  K 5.1  CL 90*  CO2 29  GLUCOSE 111*  BUN 47*  CREATININE 1.57*  CALCIUM 7.3*  MG 1.7  AST 3,621*  ALT 2,791*  ALKPHOS 138*  BILITOT 2.1*   ------------------------------------------------------------------------------------------------------------------  Cardiac Enzymes Recent Labs  Lab 10/21/18 0755  TROPONINI 0.08*   ------------------------------------------------------------------------------------------------------------------  RADIOLOGY:  Dg Chest 1 View  Result Date: 10/21/2018 CLINICAL DATA:  77 y/o  M; aspiration into the respiratory tract. EXAM: CHEST  1 VIEW COMPARISON:  10/21/2018 chest CT and chest radiograph. FINDINGS: Stable right basilar opacity and right-sided pleural effusion. Interstitial pulmonary edema. Right port catheter tip projects over mid SVC. Stable cardiac silhouette given projection and technique. Single lead AICD. Post median sternotomy with wires in alignment. Calcific aortic atherosclerosis. No acute osseous abnormality is evident. IMPRESSION: Stable right basilar opacity corresponding to mass on CT and right-sided pleural effusion. Interstitial  pulmonary edema.  Aortic Atherosclerosis (ICD10-I70.0). Electronically Signed   By: Kristine Garbe M.D.   On: 10/21/2018 20:23   Ct Chest Wo Contrast  Result Date: 10/21/2018 CLINICAL DATA:  Shortness of breath and weakness. EXAM: CT CHEST WITHOUT CONTRAST TECHNIQUE: Multidetector CT imaging of the chest was performed following the standard protocol without IV contrast. COMPARISON:  08/21/2018 FINDINGS: Cardiovascular: Evaluation of vascular structures is limited without IV contrast material. Cardiac pacemaker. Diffuse cardiac enlargement. No pericardial effusions. Coronary artery calcifications. Postoperative changes in the mediastinum. Calcification throughout the aorta. Mediastinum/Nodes: Moderate prominent lymph nodes throughout the mediastinum without change since prior study, likely reactive. Lungs/Pleura: Bilateral pleural effusions, greater on the right. Atelectasis in the lung bases. Known right lower lobe mass is visible but somewhat obscured by the effusion and atelectasis. Emphysematous changes in the lungs. No pneumothorax. Airways are patent. Upper Abdomen: Probable hepatic cirrhosis. Free fluid in the right upper quadrant likely represents ascites. Musculoskeletal: Degenerative changes in the spine. No destructive bone lesions. Postoperative sternotomy wires. IMPRESSION: Known mass in the right lung base. Cardiac enlargement. Bilateral pleural effusions with basilar atelectasis, greater on the right. Mild progression on the right since previous study. Hepatic cirrhosis with upper abdominal ascites. Electronically Signed   By: Lucienne Capers M.D.   On: 10/21/2018 06:53   Dg Chest Port 1 View  Result Date: 10/21/2018 CLINICAL DATA:  Shortness of breath. EXAM: PORTABLE CHEST 1 VIEW COMPARISON:  Chest x-rays dated 10/11/2018 and 08/10/2018. FINDINGS: Increased opacity at the RIGHT lung base. Probable mild atelectasis and/or small pleural effusion at the LEFT lung base. No pneumothorax  seen. No acute or suspicious osseous finding. Stable cardiomegaly. RIGHT chest wall Port-A-Cath appears stable in position with tip at the level of the mid/lower SVC. LEFT chest wall pacemaker/ICD apparatus is stable in position. Median sternotomy wires appear intact and stable in alignment. IMPRESSION: 1. Increasing opacity at the RIGHT lung base. This is mostly related to the RIGHT lower lobe mass identified on recent chest CT (08/21/2018) and PET-CT (08/30/2018), likely with superimposed pleural effusion and/or atelectasis that has slightly increased in the interval. 2. Stable cardiomegaly. 3. Probable mild atelectasis and/or small pleural effusion at the LEFT lung base. Electronically Signed   By: Franki Cabot M.D.   On: 10/21/2018 00:32   US Liver Doppler  Result Date: 10/21/2018 CLINICAL DATA:  Abnormal transaminases. Patient undergoing chemotherapy and radiation for stage III lung cancer. EXAM: DUPLEX ULTRASOUND OF LIVER TECHNIQUE: Color and duplex Doppler ultrasound was performed to evaluate the hepatic in-flow and out-flow vessels. COMPARISON:  CT abdomen 09/25/2018 FINDINGS: Gallbladder: The gallbladder is not definitively identified. In the gallbladder fossa, there is a structure which is could represent a severely contracted gallbladder. No significant gallbladder lumen is visualized. No stones are identified. Murphy's sign is negative. Common bile duct: Diameter: 4.1 mm, normal Liver: normal parenchymal echogenicity. Normal hepatic contour without nodularity. No focal lesion, mass or intrahepatic biliary ductal dilatation. Portal Vein Velocities Main:  29 cm/sec Right:  39 cm/sec Left:  116 cm/sec Main portal vein demonstrates bidirectional flow with intermittent hepatopetal and hepatofugal flow direction. Right and left portal veins demonstrate normal hepatopetal flow direction. Hepatic Vein Velocities Right:  32 cm/sec Middle:  56 cm/sec Left:  30 cm/sec Normal flow direction demonstrated in the  hepatic veins. IVC: Present and patent with normal respiratory phasicity. Velocity measures 36 centimeters/second. Hepatic Artery Velocity:  130/32 cm/sec Splenic Vein Velocity:  48 cm/sec Varices: Not identified. Ascites: Minimal fluid over the liver edge. Incidental note  of small right pleural effusion. IMPRESSION: 1. Gallbladder is markedly contracted, likely to be physiologic but nonspecific. No stones identified. 2. Normal flow velocities demonstrated in the portal and hepatic veins. Bidirectional flow is demonstrated in the main portal vein. Bidirectional flow may be due to Valsalva or early portal hypertension. 3. Right pleural effusion and small amount of ascites. Electronically Signed   By: Lucienne Capers M.D.   On: 10/21/2018 05:26   US Abdomen Limited Ruq  Result Date: 10/21/2018 CLINICAL DATA:  Abnormal transaminases. Patient undergoing chemotherapy and radiation for stage III lung cancer. EXAM: DUPLEX ULTRASOUND OF LIVER TECHNIQUE: Color and duplex Doppler ultrasound was performed to evaluate the hepatic in-flow and out-flow vessels. COMPARISON:  CT abdomen 09/25/2018 FINDINGS: Gallbladder: The gallbladder is not definitively identified. In the gallbladder fossa, there is a structure which is could represent a severely contracted gallbladder. No significant gallbladder lumen is visualized. No stones are identified. Murphy's sign is negative. Common bile duct: Diameter: 4.1 mm, normal Liver: normal parenchymal echogenicity. Normal hepatic contour without nodularity. No focal lesion, mass or intrahepatic biliary ductal dilatation. Portal Vein Velocities Main:  29 cm/sec Right:  39 cm/sec Left:  116 cm/sec Main portal vein demonstrates bidirectional flow with intermittent hepatopetal and hepatofugal flow direction. Right and left portal veins demonstrate normal hepatopetal flow direction. Hepatic Vein Velocities Right:  32 cm/sec Middle:  56 cm/sec Left:  30 cm/sec Normal flow direction demonstrated  in the hepatic veins. IVC: Present and patent with normal respiratory phasicity. Velocity measures 36 centimeters/second. Hepatic Artery Velocity:  130/32 cm/sec Splenic Vein Velocity:  48 cm/sec Varices: Not identified. Ascites: Minimal fluid over the liver edge. Incidental note of small right pleural effusion. IMPRESSION: 1. Gallbladder is markedly contracted, likely to be physiologic but nonspecific. No stones identified. 2. Normal flow velocities demonstrated in the portal and hepatic veins. Bidirectional flow is demonstrated in the main portal vein. Bidirectional flow may be due to Valsalva or early portal hypertension. 3. Right pleural effusion and small amount of ascites. Electronically Signed   By: Lucienne Capers M.D.   On: 10/21/2018 05:26     ASSESSMENT AND PLAN:   77 year old male patient with history of atrial fibrillation, aortic valve regurgitation, Barrett's esophagus, stage III lung cancer, chronic congestive heart failure, COPD, GERD, gout, hyperlipidemia, hypertension currently in ICU  -Acute ischemic hepatitis  Liver enzymes follow-up Gastroenterology consultation appreciated Volume perfusion and follow-up LFTs  -Supratherapeutic INR secondary to liver failure Coming down with vitamin K  -Acute hyperkalemia resolved  -Right-sided heart failure with congestive hepatopathy with history of aortic valve replacement Management as per cardiology  -Acute renal failure on chronic kidney disease stage III Appreciate nephrology evaluation On IV bicarb gtt.  -Stage III lung cancer Status post oncology evaluation Further chemotherapy deferred in view of poor cardiopulmonary status  -Atrial fibrillation Status post cardiology evaluation Amiodarone on hold due to hepatocellular failure Maintain INR between 2 and 3 No further intervention recommended Follow-up echocardiogram  All the records are reviewed and case discussed with Care Management/Social Worker. Management  plans discussed with the patient, family and they are in agreement.  CODE STATUS: DNR  DVT Prophylaxis: SCDs  TOTAL TIME TAKING CARE OF THIS PATIENT: 35 minutes.   POSSIBLE D/C IN 2 to 3 DAYS, DEPENDING ON CLINICAL CONDITION.  Saundra Shelling M.D on 10/22/2018 at 12:15 PM  Between 7am to 6pm - Pager - [phone]  After 6pm go to [email or web address] - password [email or web address] [facility]  Office  [phone]  CC: Primary care physician; Guadalupe Maple, MD  Note: This dictation was prepared with Dragon dictation along with smaller phrase technology. Any transcriptional errors that result from this process are unintentional.

## 2018-10-22 NOTE — Progress Notes (Signed)
Name: Kirk Jones [facility] MRN: [id number] DOB: Aug 05, 1941     CONSULTATION DATE: 10/20/2018 Subjective & objectives: Afebrile, hemodynamically stable and no major events last night  PAST MEDICAL HISTORY :   has a past medical history of AICD (automatic cardioverter/defibrillator) present, Aortic valve regurgitation, Atrial fibrillation ([place]), Barrett's esophagus, Cancer (HCC), CHF (congestive heart failure) ([place]), COPD (chronic obstructive pulmonary disease) ([place]), Diverticulosis, Dysrhythmia, GERD (gastroesophageal reflux disease), Gout, Headache, Heart murmur, Hematuria, Hernia of abdominal cavity, History of prosthetic heart valve, Hyperlipidemia, Hypertension, Nodular prostate with urinary obstruction, Presence of permanent cardiac pacemaker, Renal insufficiency, and Substance abuse ([place]).  has a past surgical history that includes Carpal tunnel release (Right, 2015); Vasectomy; Cataract extraction (Right, 2017); Colonoscopy with propofol (N/A, 2014); Cardiac valve replacement (N/A, 2004); PACEMAKER IMPLANT (Left, 2013); Foot surgery (Bilateral); Eye surgery; Coronary angioplasty; Fracture surgery (Right); Insert / replace / remove pacemaker; Inguinal hernia repair (Right, 09/16/2017); and PORTA CATH INSERTION (N/A, 09/21/2018). Prior to Admission medications   Medication Sig Start Date End Date Taking? Authorizing Provider  acetaminophen (TYLENOL) 650 MG CR tablet Take 650 mg by mouth 2 (two) times daily.    [provider]  amiodarone (PACERONE) 200 MG tablet Take 2 tablets (400 mg total) by mouth 2 (two) times daily. 10/15/18   Hillary Bow, MD  aspirin EC 81 MG tablet Take 81 mg by mouth daily.    [provider]  COLCRYS 0.6 MG tablet TAKE 1 TABLET BY MOUTH ONCE DAILY 09/13/18   Guadalupe Maple, MD  desonide (DESOWEN) 0.05 % cream Apply topically 2 (two) times daily. 03/30/18   Volney American, PA-C  dexamethasone (DECADRON) 4 MG tablet Take 2 tablets (8 mg  total) by mouth daily. Start the day after chemotherapy for 2 days. Patient not taking: Reported on 10/05/2018 09/16/18   Sindy Guadeloupe, MD  diltiazem (CARDIZEM CD) 180 MG 24 hr capsule  05/06/18   [provider]  empagliflozin (JARDIANCE) 10 MG TABS tablet Take 10 mg by mouth daily.    [provider]  fexofenadine (ALLEGRA) 180 MG tablet Take 180 mg by mouth daily.    [provider]  finasteride (PROSCAR) 5 MG tablet TAKE 1 TABLET BY MOUTH ONCE DAILY 07/12/18   Volney American, PA-C  fluticasone [facility]) 50 MCG/ACT nasal spray Place 2 sprays daily into both nostrils. 10/14/17   Guadalupe Maple, MD  furosemide (LASIX) 40 MG tablet Take 40 mg by mouth daily.     [provider]  ketoconazole (NIZORAL) 2 % shampoo Apply 1 application topically 2 (two) times a week. Patient not taking: Reported on 10/19/2018 09/07/18   Volney American, PA-C  lidocaine-prilocaine (EMLA) cream Apply to affected area once Patient not taking: Reported on 10/19/2018 09/16/18   Sindy Guadeloupe, MD  loratadine (CLARITIN) 10 MG tablet Take 1 tablet (10 mg total) by mouth daily. Patient not taking: Reported on 10/12/2018 07/27/18   Volney American, PA-C  LORazepam (ATIVAN) 0.5 MG tablet Take 1 tablet (0.5 mg total) by mouth every 6 (six) hours as needed (Nausea or vomiting). Patient not taking: Reported on 10/19/2018 09/16/18   Sindy Guadeloupe, MD  Magnesium 400 MG TABS Take 400 mg by mouth daily.     [provider]  Multiple Vitamins-Minerals (CENTRUM ADULTS PO) Take 1 tablet by mouth daily.     [provider]  omeprazole (PRILOSEC) 20 MG capsule Take 1 capsule (20 mg total) by mouth daily. 10/19/17   Crissman,  Jeannette How, MD  ondansetron (ZOFRAN) 8 MG tablet Take 1 tablet (8 mg total) by mouth 2 (two) times daily as needed for refractory nausea / vomiting. Start on day 3 after chemo. Patient not taking: Reported on 10/19/2018 09/16/18   Sindy Guadeloupe,  MD  Polyethyl Glycol-Propyl Glycol (SYSTANE OP) Place 1 drop into both eyes as needed (for dry eyes).    [provider]  potassium chloride SA (K-DUR,KLOR-CON) 20 MEQ tablet Take 1 tablet (20 mEq total) by mouth daily. 10/05/18   Sindy Guadeloupe, MD  prochlorperazine (COMPAZINE) 10 MG tablet Take 1 tablet (10 mg total) by mouth every 6 (six) hours as needed (Nausea or vomiting). Patient not taking: Reported on 10/19/2018 09/16/18   Sindy Guadeloupe, MD  tamsulosin (FLOMAX) 0.4 MG CAPS capsule Take 2 capsules (0.8 mg total) by mouth daily. Patient not taking: Reported on 10/19/2018 07/27/18   Volney American, PA-C  tiotropium [facility]) 18 MCG inhalation capsule Place 1 capsule (18 mcg total) into inhaler and inhale daily. 10/19/17   Guadalupe Maple, MD  UNABLE TO FIND CBD Hemp topical cream    [provider]  warfarin (COUMADIN) 1 MG tablet TAKE 1 TABLET BY MOUTH ONCE DAILY 08/07/18   Johnson, Megan P, DO  warfarin (COUMADIN) 3 MG tablet TAKE 1 TABLET BY MOUTH ONCE DAILY. 06/21/18   Guadalupe Maple, MD  warfarin (COUMADIN) 4 MG tablet TAKE 1 TABLET BY MOUTH ONCE DAILY AT 6 P.M. Patient not taking: Reported on 10/12/2018 11/04/17   Guadalupe Maple, MD   Allergies  Allergen Reactions  . Prednisone Other (See Comments)    Reaction: "organs shutting down"    FAMILY HISTORY:  family history includes Diabetes in his maternal grandmother; Heart attack in his mother; Lung cancer in his sister. SOCIAL HISTORY:  reports that he quit smoking about 27 years ago. His smoking use included cigarettes. He has never used smokeless tobacco. He reports that he drinks about 2.0 standard drinks of alcohol per week. He reports that he does not use drugs.  REVIEW OF SYSTEMS:   Unable to obtain due to critical illness   VITAL SIGNS: Temp:  [97.5 F (36.4 C)-97.9 F (36.6 C)] 97.7 F (36.5 C) (11/24 0750) Pulse Rate:  [95-213] 103 (11/24 0750) Resp:  [14-25] 19 (11/24 0750) BP:  (110-131)/(62-118) 124/96 (11/24 0750) SpO2:  [95 %-100 %] 95 % (11/24 0700) Weight:  [76.1 kg-76.3 kg] 76.3 kg (11/24 0500)   Physical Examination:   Awake and oriented with no focal motor deficits Tolerating nasal cannula 3L/min, no distress, able to talk in full sentences, bilateral equal air entry with no adventitious sounds S1 & S2 are audible with no murmur Benign abdominal exam with feeble peristalsis Wasted extremities and no peripheral edema  ASSESSMENT / PLAN:  Acute on chronic respiratory failure baseline home O2 3 L nasal cannula per minute. -Monitor ABG, work of breathing.  DNR  AKI, hyperkalemia, hypochloremia, hyponatremia with intravascular volume depletion. -Optimize hydration, comprising agent for hyperkalemia, avoid nephrotoxins, monitor renal panel and urine output -Renal ultrasound -Renal consult if no improvement  Hepatic insufficiency with elevated transaminases, coagulopathy lactic acidemia.  Questionable amiodarone induced liver injury. -Optimize hydration, avoid hepatotoxins -Liver ultrasound and monitor LFTs -Follow with GI consult  Coagulopathy (improved) with Coumadin toxicity and questionable liver insufficiency. -Optimize FFP/vitamin K to keep INR less than 3 considering the patient status post aortic valve replacement.  No signs of active bleeding -Monitor coagulation profile  Mild  elevation of troponin possibly demand versus supply mismatch.  No chest pain.  Baseline CAD status post PCI, A. fib, status post aortic valve replacement, S/P AICD placement -Monitor cardiac enzymes and echocardiogram -Management as per cardiology. INR 2-3 s/p AVR, off Amiodaron  Stage III right lower lobe lung SCC -Consider oncology follow-up after acute phase of admission  Thrombocytopenia -Monitor platelets  DNR  DVT & GI prophylaxis.  Continue supportive care Family was updated with the patient progress, poor outcome can be agreed to the plan of  care  Critical care time 40 minutes

## 2018-10-22 NOTE — Progress Notes (Signed)
Kirk Jones , MD [address] [place], [place], [place], [address] [address] [place], [place], [place], [address] Phone: [phone]  Fax: [phone]   Ayodele Sangalang [facility] is being followed for ischemic hepatitis  Day 1 of follow up   Subjective: Feels better has more energy   Objective: Vital signs in last 24 hours: Vitals:   10/22/18 0800 10/22/18 0900 10/22/18 1000 10/22/18 1100  BP: (!) 122/109 122/72 114/75 127/83  Pulse: (!) 152 (!) 130 (!) 121 (!) 135  Resp: 19 14 14 16   Temp:      TempSrc:      SpO2: (!) 69% 96% 92% 91%  Weight:      Height:       Weight change: 3.524 kg  Intake/Output Summary (Last 24 hours) at 10/22/2018 1257 Last data filed at 10/22/2018 1038 Gross per 24 hour  Intake 120 ml  Output 1000 ml  Net -880 ml     Exam: Heart:: Regular rate and rhythm, S1S2 present or without murmur or extra heart sounds Lungs: normal, clear to auscultation and clear to auscultation and percussion, pacemaker left infraclavicular area, central sternotomy scar Abdomen: soft, nontender, normal bowel sounds   Lab Results: @LABTEST2 @ Micro Results: Recent Results (from the past 240 hour(s))  MRSA PCR Screening     Status: None   Collection Time: 10/21/18 11:27 AM  Result Value Ref Range Status   MRSA by PCR NEGATIVE NEGATIVE Final    Comment:        The GeneXpert MRSA Assay (FDA approved for NASAL specimens only), is one component of a comprehensive MRSA colonization surveillance program. It is not intended to diagnose MRSA infection nor to guide or monitor treatment for MRSA infections. Performed at [facility], [address], [place],  [address]    Studies/Results: Dg Chest 1 View  Result Date: 10/21/2018 CLINICAL DATA:  77 y/o  M; aspiration into the respiratory tract. EXAM: CHEST  1 VIEW COMPARISON:  10/21/2018 chest CT and chest radiograph. FINDINGS: Stable right basilar opacity and right-sided pleural effusion.  Interstitial pulmonary edema. Right port catheter tip projects over mid SVC. Stable cardiac silhouette given projection and technique. Single lead AICD. Post median sternotomy with wires in alignment. Calcific aortic atherosclerosis. No acute osseous abnormality is evident. IMPRESSION: Stable right basilar opacity corresponding to mass on CT and right-sided pleural effusion. Interstitial pulmonary edema. Aortic Atherosclerosis (ICD10-I70.0). Electronically Signed   By: Kristine Garbe M.D.   On: 10/21/2018 20:23   Ct Chest Wo Contrast  Result Date: 10/21/2018 CLINICAL DATA:  Shortness of breath and weakness. EXAM: CT CHEST WITHOUT CONTRAST TECHNIQUE: Multidetector CT imaging of the chest was performed following the standard protocol without IV contrast. COMPARISON:  08/21/2018 FINDINGS: Cardiovascular: Evaluation of vascular structures is limited without IV contrast material. Cardiac pacemaker. Diffuse cardiac enlargement. No pericardial effusions. Coronary artery calcifications. Postoperative changes in the mediastinum. Calcification throughout the aorta. Mediastinum/Nodes: Moderate prominent lymph nodes throughout the mediastinum without change since prior study, likely reactive. Lungs/Pleura: Bilateral pleural effusions, greater on the right. Atelectasis in the lung bases. Known right lower lobe mass is visible but somewhat obscured by the effusion and atelectasis. Emphysematous changes in the lungs. No pneumothorax. Airways are patent. Upper Abdomen: Probable hepatic cirrhosis. Free fluid in the right upper quadrant likely represents ascites. Musculoskeletal: Degenerative changes in the spine. No destructive bone lesions. Postoperative sternotomy wires. IMPRESSION: Known mass in the right lung base. Cardiac enlargement. Bilateral pleural effusions with basilar atelectasis,  greater on the right. Mild progression on the right since previous study. Hepatic cirrhosis with upper abdominal ascites.  Electronically Signed   By: Lucienne Capers M.D.   On: 10/21/2018 06:53   Dg Chest Port 1 View  Result Date: 10/21/2018 CLINICAL DATA:  Shortness of breath. EXAM: PORTABLE CHEST 1 VIEW COMPARISON:  Chest x-rays dated 10/11/2018 and 08/10/2018. FINDINGS: Increased opacity at the RIGHT lung base. Probable mild atelectasis and/or small pleural effusion at the LEFT lung base. No pneumothorax seen. No acute or suspicious osseous finding. Stable cardiomegaly. RIGHT chest wall Port-A-Cath appears stable in position with tip at the level of the mid/lower SVC. LEFT chest wall pacemaker/ICD apparatus is stable in position. Median sternotomy wires appear intact and stable in alignment. IMPRESSION: 1. Increasing opacity at the RIGHT lung base. This is mostly related to the RIGHT lower lobe mass identified on recent chest CT (08/21/2018) and PET-CT (08/30/2018), likely with superimposed pleural effusion and/or atelectasis that has slightly increased in the interval. 2. Stable cardiomegaly. 3. Probable mild atelectasis and/or small pleural effusion at the LEFT lung base. Electronically Signed   By: Franki Cabot M.D.   On: 10/21/2018 00:32   US Liver Doppler  Result Date: 10/21/2018 CLINICAL DATA:  Abnormal transaminases. Patient undergoing chemotherapy and radiation for stage III lung cancer. EXAM: DUPLEX ULTRASOUND OF LIVER TECHNIQUE: Color and duplex Doppler ultrasound was performed to evaluate the hepatic in-flow and out-flow vessels. COMPARISON:  CT abdomen 09/25/2018 FINDINGS: Gallbladder: The gallbladder is not definitively identified. In the gallbladder fossa, there is a structure which is could represent a severely contracted gallbladder. No significant gallbladder lumen is visualized. No stones are identified. Murphy's sign is negative. Common bile duct: Diameter: 4.1 mm, normal Liver: normal parenchymal echogenicity. Normal hepatic contour without nodularity. No focal lesion, mass or intrahepatic biliary  ductal dilatation. Portal Vein Velocities Main:  29 cm/sec Right:  39 cm/sec Left:  116 cm/sec Main portal vein demonstrates bidirectional flow with intermittent hepatopetal and hepatofugal flow direction. Right and left portal veins demonstrate normal hepatopetal flow direction. Hepatic Vein Velocities Right:  32 cm/sec Middle:  56 cm/sec Left:  30 cm/sec Normal flow direction demonstrated in the hepatic veins. IVC: Present and patent with normal respiratory phasicity. Velocity measures 36 centimeters/second. Hepatic Artery Velocity:  130/32 cm/sec Splenic Vein Velocity:  48 cm/sec Varices: Not identified. Ascites: Minimal fluid over the liver edge. Incidental note of small right pleural effusion. IMPRESSION: 1. Gallbladder is markedly contracted, likely to be physiologic but nonspecific. No stones identified. 2. Normal flow velocities demonstrated in the portal and hepatic veins. Bidirectional flow is demonstrated in the main portal vein. Bidirectional flow may be due to Valsalva or early portal hypertension. 3. Right pleural effusion and small amount of ascites. Electronically Signed   By: Lucienne Capers M.D.   On: 10/21/2018 05:26   US Abdomen Limited Ruq  Result Date: 10/21/2018 CLINICAL DATA:  Abnormal transaminases. Patient undergoing chemotherapy and radiation for stage III lung cancer. EXAM: DUPLEX ULTRASOUND OF LIVER TECHNIQUE: Color and duplex Doppler ultrasound was performed to evaluate the hepatic in-flow and out-flow vessels. COMPARISON:  CT abdomen 09/25/2018 FINDINGS: Gallbladder: The gallbladder is not definitively identified. In the gallbladder fossa, there is a structure which is could represent a severely contracted gallbladder. No significant gallbladder lumen is visualized. No stones are identified. Murphy's sign is negative. Common bile duct: Diameter: 4.1 mm, normal Liver: normal parenchymal echogenicity. Normal hepatic contour without nodularity. No focal lesion, mass or intrahepatic  biliary ductal  dilatation. Portal Vein Velocities Main:  29 cm/sec Right:  39 cm/sec Left:  116 cm/sec Main portal vein demonstrates bidirectional flow with intermittent hepatopetal and hepatofugal flow direction. Right and left portal veins demonstrate normal hepatopetal flow direction. Hepatic Vein Velocities Right:  32 cm/sec Middle:  56 cm/sec Left:  30 cm/sec Normal flow direction demonstrated in the hepatic veins. IVC: Present and patent with normal respiratory phasicity. Velocity measures 36 centimeters/second. Hepatic Artery Velocity:  130/32 cm/sec Splenic Vein Velocity:  48 cm/sec Varices: Not identified. Ascites: Minimal fluid over the liver edge. Incidental note of small right pleural effusion. IMPRESSION: 1. Gallbladder is markedly contracted, likely to be physiologic but nonspecific. No stones identified. 2. Normal flow velocities demonstrated in the portal and hepatic veins. Bidirectional flow is demonstrated in the main portal vein. Bidirectional flow may be due to Valsalva or early portal hypertension. 3. Right pleural effusion and small amount of ascites. Electronically Signed   By: Lucienne Capers M.D.   On: 10/21/2018 05:26   Medications: I have reviewed the patient's current medications. Scheduled Meds: . finasteride  5 mg Oral Daily  . insulin aspart  0-5 Units Subcutaneous QHS  . insulin aspart  0-9 Units Subcutaneous TID WC  . pantoprazole  40 mg Oral Daily  . phytonadione  2.5 mg Oral Once   Continuous Infusions: PRN Meds:.bisacodyl, senna-docusate  Hepatic Function Latest Ref Rng & Units 10/22/2018 10/21/2018 10/21/2018  Total Protein 6.5 - 8.1 g/dL 5.5(L) 5.4(L) 5.5(L)  Albumin 3.5 - 5.0 g/dL 3.0(L) 2.8(L) 2.8(L)  AST 15 - 41 U/L 3,621(H) 4,382(H) 4,530(H)  ALT 0 - 44 U/L 2,791(H) 2,817(H) 2,794(H)  Alk Phosphatase 38 - 126 U/L 138(H) 130(H) 129(H)  Total Bilirubin 0.3 - 1.2 mg/dL 2.1(H) 1.5(H) 1.6(H)     Assessment: Active Problems:   Transaminasemia    Hyperkalemia   Supratherapeutic INR  [place] is a 77 y.o. y/o male with a history of heart failure, lung cancer.  Recently started on amiodarone a week back .  Comes into the hospital with AKI, hyperkalemia, hyponatremia, hypo chloremia all suggesting low intravascular fluid status.  Supratherapeutic INR.  Noted to have acute rise in transaminases in the thousands.  The most likely etiology for abnormal liver function test in this patient will be ischemic hepatitis from poor perfusion of the liver.  The likely etiology for poor perfusion of the liver would be a combination of his possible baseline heart failure and a combination of hypovolemia. He has an elevated JVD upto his ear lobe suggesting possible tricuspid regurgitation and possible back pressure on the liver as well .  The other differentials would be acute viral hepatitis or medication such as amiodarone although its only on rare occasions particularly with IV amiodarone has severe hepatocellular injury been described.He does drink a bit of alcohol daily and that may also have an effect on the liver  Plan 1.  Ischemic hepatitis would most likely respond if improved the perfusion to his liver. Fluid management per renal and cardiology. Creatinine is impoving 2.  F/u  acute hepatitis panel has been ordered but I would expect his transaminases to start returning back to normal within 24 to 48 hours of aggressive hydration. 3.  Hold  amiodarone for now.  ?cardio renal syndrome F/u echo      LOS: 1 day   Kirk Bellows, MD 10/22/2018, 12:57 PM

## 2018-10-22 NOTE — Progress Notes (Signed)
[facility] Encounter Note  Patient: Kirk Jones [facility] / Admit Date: 10/20/2018 / Date of Encounter: 10/22/2018, 6:16 AM   Subjective: Patient is significantly improved from admission.  Less weakness fatigue shortness of breath.  Patient may be metabolizing and/or improving from overall illness.  Laboratories pending for further evaluation of significant hepatocellular failure.  Heart rate is reasonable at this time with no evidence of congestive heart failure or myocardial infarction or symptoms thereof.  Review of Systems: Positive for: Shortness of breath weakness Negative for: Vision change, hearing change, syncope, dizziness, nausea, vomiting,diarrhea, bloody stool, stomach pain, cough, congestion, diaphoresis, urinary frequency, urinary pain,skin lesions, skin rashes Others previously listed  Objective: Telemetry: Atrial fibrillation with bundle branch block Physical Exam: Blood pressure 112/81, pulse (!) 123, temperature 97.6 F (36.4 C), temperature source Axillary, resp. rate 15, height 5\' 3"  (1.6 m), weight 76.3 kg, SpO2 100 %. Body mass index is 29.8 kg/m. General: Well developed, well nourished, in no acute distress. Head: Normocephalic, atraumatic, sclera non-icteric, no xanthomas, nares are without discharge. Neck: No apparent masses Lungs: Normal respirations with few wheezes, no rhonchi, no rales , no crackles   Heart: Irregular rate and rhythm, normal S1 S2, no murmur, no rub, no gallop, PMI is normal size and placement, carotid upstroke normal without bruit, jugular venous pressure normal Abdomen: Soft, non-tender, non-distended with normoactive bowel sounds. No hepatosplenomegaly. Abdominal aorta is normal size without bruit Extremities: Trace edema, no clubbing, no cyanosis, no ulcers,  Peripheral: 2+ radial, 2+ femoral, 2+ dorsal pedal pulses Neuro: Alert and oriented. Moves all extremities spontaneously. Psych:  Responds to questions  appropriately with a normal affect.   Intake/Output Summary (Last 24 hours) at 10/22/2018 0616 Last data filed at 10/21/2018 2200 Gross per 24 hour  Intake 296.77 ml  Output 625 ml  Net -328.23 ml    Inpatient Medications:  . finasteride  5 mg Oral Daily  . insulin aspart  0-5 Units Subcutaneous QHS  . insulin aspart  0-9 Units Subcutaneous TID WC  . pantoprazole  40 mg Oral Daily  . phytonadione  2.5 mg Oral Once   Infusions:   Labs: Recent Labs    10/21/18 1946 10/21/18 2326  NA 132* 132*  K 5.0 4.9  CL 91* 92*  CO2 32 28  GLUCOSE 127* 117*  BUN 45* 46*  CREATININE 1.65* 1.55*  CALCIUM 7.1* 6.9*  MG 1.8 1.7  PHOS 5.6* 5.3*   Recent Labs    10/21/18 1946 10/21/18 2326  AST 4,530* 4,382*  ALT 2,794* 2,817*  ALKPHOS 129* 130*  BILITOT 1.6* 1.5*  PROT 5.5* 5.4*  ALBUMIN 2.8* 2.8*   Recent Labs    10/21/18 1946 10/21/18 2326  WBC 9.8 9.9  NEUTROABS 9.7* 9.7*  HGB 8.6* 9.0*  HCT 25.9* 26.3*  MCV 93.2 92.0  PLT 119* 117*   Recent Labs    10/21/18 0004 10/21/18 0235 10/21/18 0755  TROPONINI 0.07* 0.07* 0.08*   Invalid input(s): POCBNP No results for input(s): HGBA1C in the last 72 hours.   Weights: Filed Weights   10/21/18 0054 10/21/18 1124 10/22/18 0500  Weight: 72.6 kg 76.1 kg 76.3 kg     Radiology/Studies:  Dg Chest 1 View  Result Date: 10/21/2018 CLINICAL DATA:  77 y/o  M; aspiration into the respiratory tract. EXAM: CHEST  1 VIEW COMPARISON:  10/21/2018 chest CT and chest radiograph. FINDINGS: Stable right basilar opacity and right-sided pleural effusion. Interstitial pulmonary edema. Right port catheter tip projects over  mid SVC. Stable cardiac silhouette given projection and technique. Single lead AICD. Post median sternotomy with wires in alignment. Calcific aortic atherosclerosis. No acute osseous abnormality is evident. IMPRESSION: Stable right basilar opacity corresponding to mass on CT and right-sided pleural effusion. Interstitial  pulmonary edema. Aortic Atherosclerosis (ICD10-I70.0). Electronically Signed   By: Kristine Garbe M.D.   On: 10/21/2018 20:23   Ct Head Wo Contrast  Result Date: 10/12/2018 CLINICAL DATA:  Acute onset of dizziness and fall. Hit head on headboard. Laceration at the right temple. Patient on Coumadin. Initial encounter. EXAM: CT HEAD WITHOUT CONTRAST TECHNIQUE: Contiguous axial images were obtained from the base of the skull through the vertex without intravenous contrast. COMPARISON:  CT of the head performed 09/04/2018 FINDINGS: Brain: No evidence of acute infarction, hemorrhage, hydrocephalus, extra-axial collection or mass lesion / mass effect. Prominence of the ventricles and sulci reflects mild to moderate cortical volume loss. Cerebellar atrophy is noted. Scattered periventricular and subcortical white matter change likely reflects small vessel ischemic microangiopathy. A small chronic lacunar infarct is noted at the left cerebellar hemisphere. The brainstem and fourth ventricle are within normal limits. The basal ganglia are unremarkable in appearance. The cerebral hemispheres demonstrate grossly normal gray-white differentiation. No mass effect or midline shift is seen. Vascular: No hyperdense vessel or unexpected calcification. Skull: There is no evidence of fracture; visualized osseous structures are unremarkable in appearance. Sinuses/Orbits: The visualized portions of the orbits are within normal limits. The paranasal sinuses and mastoid air cells are well-aerated. Other: A prominent soft tissue laceration is noted anterior to the right ear, with soft tissue air extending below the right ear. IMPRESSION: 1. No evidence of traumatic intracranial injury or fracture. 2. Prominent soft tissue laceration anterior to the right ear, with soft tissue air extending below the right ear. 3. Mild to moderate cortical volume loss and scattered small vessel ischemic microangiopathy. 4. Small chronic  lacunar infarct at the left cerebellar hemisphere. Electronically Signed   By: Garald Balding M.D.   On: 10/12/2018 00:18   Ct Chest Wo Contrast  Result Date: 10/21/2018 CLINICAL DATA:  Shortness of breath and weakness. EXAM: CT CHEST WITHOUT CONTRAST TECHNIQUE: Multidetector CT imaging of the chest was performed following the standard protocol without IV contrast. COMPARISON:  08/21/2018 FINDINGS: Cardiovascular: Evaluation of vascular structures is limited without IV contrast material. Cardiac pacemaker. Diffuse cardiac enlargement. No pericardial effusions. Coronary artery calcifications. Postoperative changes in the mediastinum. Calcification throughout the aorta. Mediastinum/Nodes: Moderate prominent lymph nodes throughout the mediastinum without change since prior study, likely reactive. Lungs/Pleura: Bilateral pleural effusions, greater on the right. Atelectasis in the lung bases. Known right lower lobe mass is visible but somewhat obscured by the effusion and atelectasis. Emphysematous changes in the lungs. No pneumothorax. Airways are patent. Upper Abdomen: Probable hepatic cirrhosis. Free fluid in the right upper quadrant likely represents ascites. Musculoskeletal: Degenerative changes in the spine. No destructive bone lesions. Postoperative sternotomy wires. IMPRESSION: Known mass in the right lung base. Cardiac enlargement. Bilateral pleural effusions with basilar atelectasis, greater on the right. Mild progression on the right since previous study. Hepatic cirrhosis with upper abdominal ascites. Electronically Signed   By: Lucienne Capers M.D.   On: 10/21/2018 06:53   Dg Hand 2 View Left  Result Date: 10/12/2018 CLINICAL DATA:  Pt reports he had a fall last night and is now experiencing pain in his left hand. Pt hand appears swollen and red at this time EXAM: LEFT HAND - 2 VIEW COMPARISON:  None.  FINDINGS: IV tubing overlies the wrist. There are degenerative changes insert wrist, primarily  along the radial aspect of the carpals and the first carpometacarpal joint. There is degenerative change at the second metacarpophalangeal joint. No acute fracture or subluxation. Atherosclerotic calcification of the small arteries. IMPRESSION: No evidence for acute  abnormality.  Degenerative changes. Electronically Signed   By: Nolon Nations M.D.   On: 10/12/2018 15:27   US Venous Img Upper Uni Left  Result Date: 10/15/2018 CLINICAL DATA:  Left arm swelling. Recent fall. Left cardiac ICD. Edema from the elbow to the wrist. EXAM: LEFT UPPER EXTREMITY VENOUS DOPPLER ULTRASOUND TECHNIQUE: Gray-scale sonography with graded compression, as well as color Doppler and duplex ultrasound were performed to evaluate the upper extremity deep venous system from the level of the subclavian vein and including the jugular, axillary, basilic, radial, ulnar and upper cephalic vein. Spectral Doppler was utilized to evaluate flow at rest and with distal augmentation maneuvers. COMPARISON:  Chest radiograph 10/11/2018 FINDINGS: Contralateral Subclavian Vein: Normal respiratory phasicity. Normal color Doppler flow in the right subclavian vein. No evidence for right subclavian thrombus. Internal Jugular Vein: No evidence of thrombus. Normal compressibility, respiratory phasicity and response to augmentation. Subclavian Vein: The left cardiac ICD lead is noted. There appears to be flow around the lead and left subclavian vein appears to be patent without thrombus. Axillary Vein: No evidence of thrombus. Normal compressibility, respiratory phasicity and response to augmentation. Cephalic Vein: No evidence of thrombus. Normal compressibility, respiratory phasicity and response to augmentation. Basilic Vein: No evidence of thrombus. Normal compressibility, respiratory phasicity and response to augmentation. Brachial Veins: No evidence of thrombus. Normal compressibility, respiratory phasicity and response to augmentation. Radial  Veins: No evidence of thrombus. Normal compressibility, respiratory phasicity and response to augmentation. Single radial vein is identified. Ulnar Veins: No evidence of thrombus. Normal compressibility, respiratory phasicity and response to augmentation. Single ulnar vein is identified. Venous Reflux:  None visualized. Other Findings:  Subcutaneous edema. IMPRESSION: No evidence of DVT within the left upper extremity. Limited evaluation of the left subclavian vein due to the cardiac ICD but no evidence for thrombus in this area. Electronically Signed   By: Markus Daft M.D.   On: 10/15/2018 14:58   Ct Abdomen W Wo Contrast  Result Date: 09/25/2018 CLINICAL DATA:  Evaluate pancreatic calcifications EXAM: CT ABDOMEN WITHOUT AND WITH CONTRAST TECHNIQUE: Multidetector CT imaging of the abdomen was performed following the standard protocol before and following the bolus administration of intravenous contrast. CONTRAST:  165mL ISOVUE-300 IOPAMIDOL (ISOVUE-300) INJECTION 61% COMPARISON:  PET-CT 08/30/2018. FINDINGS: Lower chest: Large right lower lobe lung mass is again identified measuring 7.9 cm. Loculated pleural fluid is identified overlying the right midlung and right lower lobe. Small left pleural effusion noted. Aortic atherosclerosis is identified. Hepatobiliary: No focal liver abnormality. The gallbladder is not confidently identified and may be surgically absent. Pancreas: No pancreatic inflammation identified faint punctate areas of mild hyperattenuation within the head of pancreas are again identified within head of pancreas, image 39/2 and may represent small calcifications. 1.4 cm low-density structure within the head of pancreas is identified measuring 32 HU, image 96/14. This is favored to represent a mildly dilated common bile duct. This roughly corresponds to the areas of calcification within the head of pancreas which may reflect choledocholithiasis. Body and tail of pancreas appear normal. Spleen:  Spleen normal. Adrenals/Urinary Tract: Unremarkable appearance of the adrenal glands. There is a cyst arising from inferior pole of right kidney measuring 3 cm,  image 46/11. Smaller cysts noted within the upper pole of the right kidney. Multiple areas of cortical scarring involve the left kidney. Stomach/Bowel: Stomach and small bowel loops have a normal course and caliber. Extensive colonic diverticulosis noted. Vascular/Lymphatic: Aortic atherosclerosis. No aneurysm. No adenopathy identified within the abdomen. Other: Trace free fluid identified along the pericolic gutters. Musculoskeletal: Mild thoraco lumbar scoliosis and multilevel degenerative disc disease. IMPRESSION: 1. Faint calcifications within head of pancreas are again noted. There is cystic area of low attenuation within the head of pancreas which may represent a mildly dilated common bile duct. Calcifications may represent choledocholithiasis. In a patient who cannot have an MRI due to pacer device ERCP may be helpful to confirm common bile duct stones. 2. Large right lower lobe lung mass with surrounding loculated pleural fluid 3.  Aortic Atherosclerosis (ICD10-I70.0). Electronically Signed   By: Kerby Moors M.D.   On: 09/25/2018 16:27   Dg Chest Port 1 View  Result Date: 10/21/2018 CLINICAL DATA:  Shortness of breath. EXAM: PORTABLE CHEST 1 VIEW COMPARISON:  Chest x-rays dated 10/11/2018 and 08/10/2018. FINDINGS: Increased opacity at the RIGHT lung base. Probable mild atelectasis and/or small pleural effusion at the LEFT lung base. No pneumothorax seen. No acute or suspicious osseous finding. Stable cardiomegaly. RIGHT chest wall Port-A-Cath appears stable in position with tip at the level of the mid/lower SVC. LEFT chest wall pacemaker/ICD apparatus is stable in position. Median sternotomy wires appear intact and stable in alignment. IMPRESSION: 1. Increasing opacity at the RIGHT lung base. This is mostly related to the RIGHT lower lobe  mass identified on recent chest CT (08/21/2018) and PET-CT (08/30/2018), likely with superimposed pleural effusion and/or atelectasis that has slightly increased in the interval. 2. Stable cardiomegaly. 3. Probable mild atelectasis and/or small pleural effusion at the LEFT lung base. Electronically Signed   By: Franki Cabot M.D.   On: 10/21/2018 00:32   Dg Chest Port 1 View  Result Date: 10/12/2018 CLINICAL DATA:  Status post fall, with concern for chest injury. Initial encounter. EXAM: PORTABLE CHEST 1 VIEW COMPARISON:  CT of the chest performed 08/21/2018 FINDINGS: There is a persistent small right-sided pleural effusion. The known right lower lobe mass is better characterized on prior CT. There is suggestion of underlying interstitial edema, which may be transient in nature. No pneumothorax is seen. The cardiomediastinal silhouette is borderline normal in size. The patient is status post median sternotomy. An AICD is noted overlying the left chest wall, with a single lead ending overlying the right ventricle. The patient's right chest port is noted ending about the mid SVC. No displaced rib fractures are seen. IMPRESSION: 1. Persistent small right-sided pleural effusion. Known right lower lobe mass is better characterized on prior CT. Suggestion of underlying interstitial edema, which may be transient in nature. 2. No displaced rib fracture seen. Electronically Signed   By: Garald Balding M.D.   On: 10/12/2018 00:22   US Liver Doppler  Result Date: 10/21/2018 CLINICAL DATA:  Abnormal transaminases. Patient undergoing chemotherapy and radiation for stage III lung cancer. EXAM: DUPLEX ULTRASOUND OF LIVER TECHNIQUE: Color and duplex Doppler ultrasound was performed to evaluate the hepatic in-flow and out-flow vessels. COMPARISON:  CT abdomen 09/25/2018 FINDINGS: Gallbladder: The gallbladder is not definitively identified. In the gallbladder fossa, there is a structure which is could represent a severely  contracted gallbladder. No significant gallbladder lumen is visualized. No stones are identified. Murphy's sign is negative. Common bile duct: Diameter: 4.1 mm, normal Liver: normal parenchymal  echogenicity. Normal hepatic contour without nodularity. No focal lesion, mass or intrahepatic biliary ductal dilatation. Portal Vein Velocities Main:  29 cm/sec Right:  39 cm/sec Left:  116 cm/sec Main portal vein demonstrates bidirectional flow with intermittent hepatopetal and hepatofugal flow direction. Right and left portal veins demonstrate normal hepatopetal flow direction. Hepatic Vein Velocities Right:  32 cm/sec Middle:  56 cm/sec Left:  30 cm/sec Normal flow direction demonstrated in the hepatic veins. IVC: Present and patent with normal respiratory phasicity. Velocity measures 36 centimeters/second. Hepatic Artery Velocity:  130/32 cm/sec Splenic Vein Velocity:  48 cm/sec Varices: Not identified. Ascites: Minimal fluid over the liver edge. Incidental note of small right pleural effusion. IMPRESSION: 1. Gallbladder is markedly contracted, likely to be physiologic but nonspecific. No stones identified. 2. Normal flow velocities demonstrated in the portal and hepatic veins. Bidirectional flow is demonstrated in the main portal vein. Bidirectional flow may be due to Valsalva or early portal hypertension. 3. Right pleural effusion and small amount of ascites. Electronically Signed   By: Lucienne Capers M.D.   On: 10/21/2018 05:26   US Abdomen Limited Ruq  Result Date: 10/21/2018 CLINICAL DATA:  Abnormal transaminases. Patient undergoing chemotherapy and radiation for stage III lung cancer. EXAM: DUPLEX ULTRASOUND OF LIVER TECHNIQUE: Color and duplex Doppler ultrasound was performed to evaluate the hepatic in-flow and out-flow vessels. COMPARISON:  CT abdomen 09/25/2018 FINDINGS: Gallbladder: The gallbladder is not definitively identified. In the gallbladder fossa, there is a structure which is could represent a  severely contracted gallbladder. No significant gallbladder lumen is visualized. No stones are identified. Murphy's sign is negative. Common bile duct: Diameter: 4.1 mm, normal Liver: normal parenchymal echogenicity. Normal hepatic contour without nodularity. No focal lesion, mass or intrahepatic biliary ductal dilatation. Portal Vein Velocities Main:  29 cm/sec Right:  39 cm/sec Left:  116 cm/sec Main portal vein demonstrates bidirectional flow with intermittent hepatopetal and hepatofugal flow direction. Right and left portal veins demonstrate normal hepatopetal flow direction. Hepatic Vein Velocities Right:  32 cm/sec Middle:  56 cm/sec Left:  30 cm/sec Normal flow direction demonstrated in the hepatic veins. IVC: Present and patent with normal respiratory phasicity. Velocity measures 36 centimeters/second. Hepatic Artery Velocity:  130/32 cm/sec Splenic Vein Velocity:  48 cm/sec Varices: Not identified. Ascites: Minimal fluid over the liver edge. Incidental note of small right pleural effusion. IMPRESSION: 1. Gallbladder is markedly contracted, likely to be physiologic but nonspecific. No stones identified. 2. Normal flow velocities demonstrated in the portal and hepatic veins. Bidirectional flow is demonstrated in the main portal vein. Bidirectional flow may be due to Valsalva or early portal hypertension. 3. Right pleural effusion and small amount of ascites. Electronically Signed   By: Lucienne Capers M.D.   On: 10/21/2018 05:26     Assessment and Recommendation  77 y.o. male with known LV systolic dysfunction coronary artery disease status post left anterior descending artery stenting aortic valve replacement ICD placement with history of ventricular tachycardia chronic kidney disease with new onset of hepatocellular failure of unknown etiology slightly improved without evidence of current significant heart failure or angina 1.  Abstain from amiodarone despite previous ventricular tachycardia due to  hepatocellular failure. 2.  Consider reinstatement of heart rate controlling medication management of atrial fibrillation with beta-blocker when able 3.  Continue assessing for INR level and goal INR between 2 and 3 for risk reduction in thrombosis of aortic valve and stroke risk of atrial fibrillation although may consider heparin until hepatocellular failure improves 4.  No further cardiac diagnostics and/or intervention necessary at this time 5.  Slow ambulation to follow for above and/or need for further treatment options  Signed, Serafina Royals M.D. FACC

## 2018-10-22 NOTE — Progress Notes (Signed)
[facility]  ROUNDING NOTE   Subjective:   Many family members at bedside.   Patient states he is feeling better. More strength and energy.  Objective:  Vital signs in last 24 hours:  Temp:  [97.5 F (36.4 C)-97.7 F (36.5 C)] 97.7 F (36.5 C) (11/24 0750) Pulse Rate:  [95-213] 135 (11/24 1100) Resp:  [14-25] 16 (11/24 1100) BP: (112-131)/(72-118) 127/83 (11/24 1100) SpO2:  [69 %-100 %] 91 % (11/24 1100) Weight:  [76.3 kg] 76.3 kg (11/24 0500)  Weight change: 3.524 kg Filed Weights   10/21/18 0054 10/21/18 1124 10/22/18 0500  Weight: 72.6 kg 76.1 kg 76.3 kg    Intake/Output: I/O last 3 completed shifts: In: 296.8 [I.V.:296.8] Out: 625 [Urine:625]   Intake/Output this shift:  Total I/O In: 120 [P.O.:120] Out: 375 [Urine:375]  Physical Exam: General: NAD,   Head: Normocephalic, atraumatic. Moist oral mucosal membranes  Eyes: Anicteric, PERRL  Neck: Supple, trachea midline  Lungs:  Clear to auscultation  Heart: Regular rate and rhythm  Abdomen:  Soft, nontender,   Extremities:  1+ peripheral edema.  Neurologic: Nonfocal, moving all four extremities  Skin: No lesions        Basic Metabolic Panel: Recent Labs  Lab 10/21/18 0755 10/21/18 1024 10/21/18 1337 10/21/18 1946 10/21/18 2326 10/22/18 0724  NA  --  132* 130* 132* 132* 133*  K  --  6.0* 5.1 5.0 4.9 5.1  CL  --  90* 91* 91* 92* 90*  CO2  --  23 28 32 28 29  GLUCOSE 127* 142* 138* 127* 117* 111*  BUN  --  44* 45* 45* 46* 47*  CREATININE  --  1.97* 1.70* 1.65* 1.55* 1.57*  CALCIUM  --  7.6* 7.1* 7.1* 6.9* 7.3*  MG 1.7  --  1.7 1.8 1.7 1.7  PHOS 6.3*  --  5.8* 5.6* 5.3* 5.6*    Liver Function Tests: Recent Labs  Lab 10/21/18 1024 10/21/18 1337 10/21/18 1946 10/21/18 2326 10/22/18 0724  AST 4,400* 4,378* 4,530* 4,382* 3,621*  ALT 2,678* 2,725* 2,794* 2,817* 2,791*  ALKPHOS 119 121 129* 130* 138*  BILITOT 1.8* 1.6* 1.6* 1.5* 2.1*  PROT 5.4* 5.4* 5.5* 5.4* 5.5*  ALBUMIN 2.8*  2.8* 2.8* 2.8* 3.0*   Recent Labs  Lab 10/21/18 0235  LIPASE 32   Recent Labs  Lab 10/21/18 0235  AMMONIA 94*    CBC: Recent Labs  Lab 10/21/18 0235 10/21/18 1337 10/21/18 1946 10/21/18 2326 10/22/18 0724  WBC 10.9* 10.3 9.8 9.9 11.1*  NEUTROABS 10.5* 10.1* 9.7* 9.7* 10.9*  HGB 9.0* 8.4* 8.6* 9.0* 9.3*  HCT 27.7* 26.0* 25.9* 26.3* 27.8*  MCV 95.8 95.9 93.2 92.0 92.7  PLT 135* 119* 119* 117* 117*    Cardiac Enzymes: Recent Labs  Lab 10/21/18 0004 10/21/18 0235 10/21/18 0755  TROPONINI 0.07* 0.07* 0.08*    BNP: Invalid input(s): POCBNP  CBG: Recent Labs  Lab 10/21/18 1120 10/21/18 1604 10/21/18 2117 10/22/18 0715 10/22/18 1204  GLUCAP 118* 124* 120* 102* 115*    Microbiology: Results for orders placed or performed during the hospital encounter of 10/20/18  MRSA PCR Screening     Status: None   Collection Time: 10/21/18 11:27 AM  Result Value Ref Range Status   MRSA by PCR NEGATIVE NEGATIVE Final    Comment:        The GeneXpert MRSA Assay (FDA approved for NASAL specimens only), is one component of a comprehensive MRSA colonization surveillance program. It is not intended to  diagnose MRSA infection nor to guide or monitor treatment for MRSA infections. Performed at [facility], [address] [place], [place] [address]     Coagulation Studies: Recent Labs    10/21/18 0235 10/21/18 1024 10/21/18 1337 10/21/18 2326 10/22/18 0724  LABPROT 81.7* 89.7* 89.6* 60.9* 46.5*  INR 10.60* 11.96* 11.95* 7.26* 5.13*    Urinalysis: No results for input(s): COLORURINE, LABSPEC, PHURINE, GLUCOSEU, HGBUR, BILIRUBINUR, KETONESUR, PROTEINUR, UROBILINOGEN, NITRITE, LEUKOCYTESUR in the last 72 hours.  Invalid input(s): APPERANCEUR    Imaging: Dg Chest 1 View  Result Date: 10/21/2018 CLINICAL DATA:  77 y/o  M; aspiration into the respiratory tract. EXAM: CHEST  1 VIEW COMPARISON:  10/21/2018 chest CT and chest radiograph. FINDINGS:  Stable right basilar opacity and right-sided pleural effusion. Interstitial pulmonary edema. Right port catheter tip projects over mid SVC. Stable cardiac silhouette given projection and technique. Single lead AICD. Post median sternotomy with wires in alignment. Calcific aortic atherosclerosis. No acute osseous abnormality is evident. IMPRESSION: Stable right basilar opacity corresponding to mass on CT and right-sided pleural effusion. Interstitial pulmonary edema. Aortic Atherosclerosis (ICD10-I70.0). Electronically Signed   By: Kristine Garbe M.D.   On: 10/21/2018 20:23   Ct Chest Wo Contrast  Result Date: 10/21/2018 CLINICAL DATA:  Shortness of breath and weakness. EXAM: CT CHEST WITHOUT CONTRAST TECHNIQUE: Multidetector CT imaging of the chest was performed following the standard protocol without IV contrast. COMPARISON:  08/21/2018 FINDINGS: Cardiovascular: Evaluation of vascular structures is limited without IV contrast material. Cardiac pacemaker. Diffuse cardiac enlargement. No pericardial effusions. Coronary artery calcifications. Postoperative changes in the mediastinum. Calcification throughout the aorta. Mediastinum/Nodes: Moderate prominent lymph nodes throughout the mediastinum without change since prior study, likely reactive. Lungs/Pleura: Bilateral pleural effusions, greater on the right. Atelectasis in the lung bases. Known right lower lobe mass is visible but somewhat obscured by the effusion and atelectasis. Emphysematous changes in the lungs. No pneumothorax. Airways are patent. Upper Abdomen: Probable hepatic cirrhosis. Free fluid in the right upper quadrant likely represents ascites. Musculoskeletal: Degenerative changes in the spine. No destructive bone lesions. Postoperative sternotomy wires. IMPRESSION: Known mass in the right lung base. Cardiac enlargement. Bilateral pleural effusions with basilar atelectasis, greater on the right. Mild progression on the right since  previous study. Hepatic cirrhosis with upper abdominal ascites. Electronically Signed   By: Lucienne Capers M.D.   On: 10/21/2018 06:53   Dg Chest Port 1 View  Result Date: 10/21/2018 CLINICAL DATA:  Shortness of breath. EXAM: PORTABLE CHEST 1 VIEW COMPARISON:  Chest x-rays dated 10/11/2018 and 08/10/2018. FINDINGS: Increased opacity at the RIGHT lung base. Probable mild atelectasis and/or small pleural effusion at the LEFT lung base. No pneumothorax seen. No acute or suspicious osseous finding. Stable cardiomegaly. RIGHT chest wall Port-A-Cath appears stable in position with tip at the level of the mid/lower SVC. LEFT chest wall pacemaker/ICD apparatus is stable in position. Median sternotomy wires appear intact and stable in alignment. IMPRESSION: 1. Increasing opacity at the RIGHT lung base. This is mostly related to the RIGHT lower lobe mass identified on recent chest CT (08/21/2018) and PET-CT (08/30/2018), likely with superimposed pleural effusion and/or atelectasis that has slightly increased in the interval. 2. Stable cardiomegaly. 3. Probable mild atelectasis and/or small pleural effusion at the LEFT lung base. Electronically Signed   By: Franki Cabot M.D.   On: 10/21/2018 00:32   US Liver Doppler  Result Date: 10/21/2018 CLINICAL DATA:  Abnormal transaminases. Patient undergoing chemotherapy and radiation for stage III lung  cancer. EXAM: DUPLEX ULTRASOUND OF LIVER TECHNIQUE: Color and duplex Doppler ultrasound was performed to evaluate the hepatic in-flow and out-flow vessels. COMPARISON:  CT abdomen 09/25/2018 FINDINGS: Gallbladder: The gallbladder is not definitively identified. In the gallbladder fossa, there is a structure which is could represent a severely contracted gallbladder. No significant gallbladder lumen is visualized. No stones are identified. Murphy's sign is negative. Jones bile duct: Diameter: 4.1 mm, normal Liver: normal parenchymal echogenicity. Normal hepatic contour  without nodularity. No focal lesion, mass or intrahepatic biliary ductal dilatation. Portal Vein Velocities Main:  29 cm/sec Right:  39 cm/sec Left:  116 cm/sec Main portal vein demonstrates bidirectional flow with intermittent hepatopetal and hepatofugal flow direction. Right and left portal veins demonstrate normal hepatopetal flow direction. Hepatic Vein Velocities Right:  32 cm/sec Middle:  56 cm/sec Left:  30 cm/sec Normal flow direction demonstrated in the hepatic veins. IVC: Present and patent with normal respiratory phasicity. Velocity measures 36 centimeters/second. Hepatic Artery Velocity:  130/32 cm/sec Splenic Vein Velocity:  48 cm/sec Varices: Not identified. Ascites: Minimal fluid over the liver edge. Incidental note of small right pleural effusion. IMPRESSION: 1. Gallbladder is markedly contracted, likely to be physiologic but nonspecific. No stones identified. 2. Normal flow velocities demonstrated in the portal and hepatic veins. Bidirectional flow is demonstrated in the main portal vein. Bidirectional flow may be due to Valsalva or early portal hypertension. 3. Right pleural effusion and small amount of ascites. Electronically Signed   By: Lucienne Capers M.D.   On: 10/21/2018 05:26   US Abdomen Limited Ruq  Result Date: 10/21/2018 CLINICAL DATA:  Abnormal transaminases. Patient undergoing chemotherapy and radiation for stage III lung cancer. EXAM: DUPLEX ULTRASOUND OF LIVER TECHNIQUE: Color and duplex Doppler ultrasound was performed to evaluate the hepatic in-flow and out-flow vessels. COMPARISON:  CT abdomen 09/25/2018 FINDINGS: Gallbladder: The gallbladder is not definitively identified. In the gallbladder fossa, there is a structure which is could represent a severely contracted gallbladder. No significant gallbladder lumen is visualized. No stones are identified. Murphy's sign is negative. Jones bile duct: Diameter: 4.1 mm, normal Liver: normal parenchymal echogenicity. Normal hepatic  contour without nodularity. No focal lesion, mass or intrahepatic biliary ductal dilatation. Portal Vein Velocities Main:  29 cm/sec Right:  39 cm/sec Left:  116 cm/sec Main portal vein demonstrates bidirectional flow with intermittent hepatopetal and hepatofugal flow direction. Right and left portal veins demonstrate normal hepatopetal flow direction. Hepatic Vein Velocities Right:  32 cm/sec Middle:  56 cm/sec Left:  30 cm/sec Normal flow direction demonstrated in the hepatic veins. IVC: Present and patent with normal respiratory phasicity. Velocity measures 36 centimeters/second. Hepatic Artery Velocity:  130/32 cm/sec Splenic Vein Velocity:  48 cm/sec Varices: Not identified. Ascites: Minimal fluid over the liver edge. Incidental note of small right pleural effusion. IMPRESSION: 1. Gallbladder is markedly contracted, likely to be physiologic but nonspecific. No stones identified. 2. Normal flow velocities demonstrated in the portal and hepatic veins. Bidirectional flow is demonstrated in the main portal vein. Bidirectional flow may be due to Valsalva or early portal hypertension. 3. Right pleural effusion and small amount of ascites. Electronically Signed   By: Lucienne Capers M.D.   On: 10/21/2018 05:26     Medications:    . finasteride  5 mg Oral Daily  . insulin aspart  0-5 Units Subcutaneous QHS  . insulin aspart  0-9 Units Subcutaneous TID WC  . pantoprazole  40 mg Oral Daily  . phytonadione  2.5 mg Oral Once  bisacodyl, senna-docusate  Assessment/ Plan:  [name] is a 77 y.o. white male with lung cancer, atrial fibrillation on warfarin, aortic valve replacement, diabetes mellitus type II, hypertension, hyperlipidemia, coronary artery disease, AICD, Barrett's esophagus  1. Acute renal faiure with hyperkalemia and hyponatremia on chronic kidney disease stage III Baseline creatinine of 0.99 on 10/14/18. Acute renal failure preceded this admission.  Holding home furosemide  dose.  - Discontinue bicarb gtt - Change to NS at 33. Monitor volume status.   2. Anemia with renal failure: hemoglobin 9.3. Appreciate hem/onc input.  3. Hypertension: blood pressure at goal.   4. Acute hepatitis: LFTs trending downwards today. INR as improved.    LOS: 1 Azilee Pirro 11/24/20191:16 PM

## 2018-10-23 ENCOUNTER — Ambulatory Visit: Payer: Medicare Other

## 2018-10-23 ENCOUNTER — Ambulatory Visit: Payer: Self-pay | Admitting: *Deleted

## 2018-10-23 ENCOUNTER — Ambulatory Visit: Payer: Medicare Other | Admitting: Family

## 2018-10-23 DIAGNOSIS — Z515 Encounter for palliative care: Secondary | ICD-10-CM

## 2018-10-23 DIAGNOSIS — I509 Heart failure, unspecified: Secondary | ICD-10-CM

## 2018-10-23 DIAGNOSIS — R748 Abnormal levels of other serum enzymes: Secondary | ICD-10-CM

## 2018-10-23 LAB — CBC WITH DIFFERENTIAL/PLATELET
ABS IMMATURE GRANULOCYTES: 0.12 10*3/uL — AB (ref 0.00–0.07)
ABS IMMATURE GRANULOCYTES: 0.18 10*3/uL — AB (ref 0.00–0.07)
BASOS PCT: 0 %
Basophils Absolute: 0 10*3/uL (ref 0.0–0.1)
Basophils Absolute: 0 10*3/uL (ref 0.0–0.1)
Basophils Relative: 0 %
Eosinophils Absolute: 0 10*3/uL (ref 0.0–0.5)
Eosinophils Absolute: 0 10*3/uL (ref 0.0–0.5)
Eosinophils Relative: 0 %
Eosinophils Relative: 0 %
HCT: 26.8 % — ABNORMAL LOW (ref 39.0–52.0)
HCT: 26.2 % — ABNORMAL LOW (ref 39.0–52.0)
HEMOGLOBIN: 8.8 g/dL — AB (ref 13.0–17.0)
Hemoglobin: 8.8 g/dL — ABNORMAL LOW (ref 13.0–17.0)
Immature Granulocytes: 1 %
Immature Granulocytes: 2 %
LYMPHS PCT: 0 %
Lymphocytes Relative: 0 %
Lymphs Abs: 0.1 10*3/uL — ABNORMAL LOW (ref 0.7–4.0)
Lymphs Abs: 0 10*3/uL — ABNORMAL LOW (ref 0.7–4.0)
MCH: 30.9 pg (ref 26.0–34.0)
MCH: 31.2 pg (ref 26.0–34.0)
MCHC: 32.8 g/dL (ref 30.0–36.0)
MCHC: 33.6 g/dL (ref 30.0–36.0)
MCV: 94 fL (ref 80.0–100.0)
MCV: 92.9 fL (ref 80.0–100.0)
MONO ABS: 0 10*3/uL — AB (ref 0.1–1.0)
Monocytes Absolute: 0 10*3/uL — ABNORMAL LOW (ref 0.1–1.0)
Monocytes Relative: 0 %
Monocytes Relative: 0 %
NEUTROS ABS: 11 10*3/uL — AB (ref 1.7–7.7)
NEUTROS ABS: 10.2 10*3/uL — AB (ref 1.7–7.7)
NEUTROS PCT: 99 %
Neutrophils Relative %: 98 %
Platelets: 111 10*3/uL — ABNORMAL LOW (ref 150–400)
Platelets: 111 10*3/uL — ABNORMAL LOW (ref 150–400)
RBC: 2.85 MIL/uL — ABNORMAL LOW (ref 4.22–5.81)
RBC: 2.82 MIL/uL — ABNORMAL LOW (ref 4.22–5.81)
RDW: 16.3 % — ABNORMAL HIGH (ref 11.5–15.5)
RDW: 16.3 % — ABNORMAL HIGH (ref 11.5–15.5)
WBC: 11.2 10*3/uL — AB (ref 4.0–10.5)
WBC: 10.5 10*3/uL (ref 4.0–10.5)
nRBC: 0.7 % — ABNORMAL HIGH (ref 0.0–0.2)
nRBC: 0.4 % — ABNORMAL HIGH (ref 0.0–0.2)

## 2018-10-23 LAB — COMPREHENSIVE METABOLIC PANEL
ALBUMIN: 2.6 g/dL — AB (ref 3.5–5.0)
ALK PHOS: 134 U/L — AB (ref 38–126)
ALT: 1934 U/L — AB (ref 0–44)
AST: 1461 U/L — AB (ref 15–41)
Anion gap: 10 (ref 5–15)
BILIRUBIN TOTAL: 1.9 mg/dL — AB (ref 0.3–1.2)
BUN: 49 mg/dL — AB (ref 8–23)
CALCIUM: 7.4 mg/dL — AB (ref 8.9–10.3)
CO2: 30 mmol/L (ref 22–32)
Chloride: 94 mmol/L — ABNORMAL LOW (ref 98–111)
Creatinine, Ser: 1.33 mg/dL — ABNORMAL HIGH (ref 0.61–1.24)
GFR calc Af Amer: 58 mL/min — ABNORMAL LOW (ref >60)
GFR calc non Af Amer: 50 mL/min — ABNORMAL LOW (ref >60)
GLUCOSE: 130 mg/dL — AB (ref 70–99)
Potassium: 4.7 mmol/L (ref 3.5–5.1)
Sodium: 134 mmol/L — ABNORMAL LOW (ref 135–145)
TOTAL PROTEIN: 5.1 g/dL — AB (ref 6.5–8.1)

## 2018-10-23 LAB — CALCIUM, IONIZED
CALCIUM, IONIZED, SERUM: 3.4 mg/dL — AB (ref 4.5–5.6)
CALCIUM, IONIZED, SERUM: 3.9 mg/dL — AB (ref 4.5–5.6)
CALCIUM, IONIZED, SERUM: 3.7 mg/dL — AB (ref 4.5–5.6)
Calcium, Ionized, Serum: 3.9 mg/dL — ABNORMAL LOW (ref 4.5–5.6)
Calcium, Ionized, Serum: 3.9 mg/dL — ABNORMAL LOW (ref 4.5–5.6)

## 2018-10-23 LAB — HEPATITIS PANEL, ACUTE
HCV Ab: <0.1 s/co ratio (ref 0.0–0.9)
HEP B C IGM: NEGATIVE
HEP B S AG: NEGATIVE
Hep A IgM: POSITIVE — AB

## 2018-10-23 LAB — ECHOCARDIOGRAM COMPLETE
HEIGHTINCHES: 63 in
WEIGHTICAEL: 2691.38 [oz_av]

## 2018-10-23 LAB — GLUCOSE, CAPILLARY
GLUCOSE-CAPILLARY: 122 mg/dL — AB (ref 70–99)
GLUCOSE-CAPILLARY: 138 mg/dL — AB (ref 70–99)
Glucose-Capillary: 128 mg/dL — ABNORMAL HIGH (ref 70–99)
Glucose-Capillary: 120 mg/dL — ABNORMAL HIGH (ref 70–99)

## 2018-10-23 LAB — APTT
APTT: 32 s (ref 24–36)
aPTT: 34 seconds (ref 24–36)
aPTT: 35 seconds (ref 24–36)
aPTT: 33 seconds (ref 24–36)

## 2018-10-23 LAB — UREA NITROGEN, URINE: UREA NITROGEN UR: 356 mg/dL

## 2018-10-23 LAB — PROTIME-INR
INR: 4.19 — AB
INR: 3.95
Prothrombin Time: 39.8 seconds — ABNORMAL HIGH (ref 11.4–15.2)
Prothrombin Time: 38 seconds — ABNORMAL HIGH (ref 11.4–15.2)

## 2018-10-23 LAB — PHOSPHORUS
PHOSPHORUS: 4.4 mg/dL (ref 2.5–4.6)
Phosphorus: 4.5 mg/dL (ref 2.5–4.6)

## 2018-10-23 LAB — MAGNESIUM
MAGNESIUM: 1.8 mg/dL (ref 1.7–2.4)
Magnesium: 1.8 mg/dL (ref 1.7–2.4)

## 2018-10-23 MED ORDER — GUAIFENESIN ER 600 MG PO TB12
600.0000 mg | ORAL_TABLET | Freq: Two times a day (BID) | ORAL | Status: DC
  Administered 2018-10-23 – 2018-10-30 (×15): 600 mg via ORAL
  Filled 2018-10-23 (×15): qty 1

## 2018-10-23 MED ORDER — GUAIFENESIN-DM 100-10 MG/5ML PO SYRP
5.0000 mL | ORAL_SOLUTION | ORAL | Status: DC | PRN
  Filled 2018-10-23: qty 5

## 2018-10-23 MED ORDER — TIOTROPIUM BROMIDE MONOHYDRATE 18 MCG IN CAPS
18.0000 ug | ORAL_CAPSULE | Freq: Every day | RESPIRATORY_TRACT | Status: DC
  Administered 2018-10-24 – 2018-10-30 (×7): 18 ug via RESPIRATORY_TRACT
  Filled 2018-10-23 (×2): qty 5

## 2018-10-23 MED ORDER — METOPROLOL SUCCINATE ER 25 MG PO TB24
25.0000 mg | ORAL_TABLET | Freq: Every day | ORAL | Status: DC
  Administered 2018-10-23 – 2018-10-24 (×2): 25 mg via ORAL
  Filled 2018-10-23 (×3): qty 1

## 2018-10-23 NOTE — Progress Notes (Signed)
[facility]    MR#:  [id number]  DATE OF BIRTH:  [birth year]  SUBJECTIVE:  CHIEF COMPLAINT:   Chief Complaint  Patient presents with  . Shortness of Breath  . Weakness  Patient seen and evaluated today Has generalized weakness Comfortable on oxygen via nasal cannula Has some congestion in the chest and cough No nausea or vomiting No abdominal discomfort No complaints of chest pain  REVIEW OF SYSTEMS:    ROS  CONSTITUTIONAL: No documented fever. Has fatigue, weakness. No weight gain, no weight loss.  EYES: No blurry or double vision.  ENT: No tinnitus. No postnasal drip. No redness of the oropharynx.  RESPIRATORY: Has cough, no wheeze, no hemoptysis.  Mild dyspnea.  CARDIOVASCULAR: No chest pain. No orthopnea. No palpitations. No syncope.  GASTROINTESTINAL: No nausea, no vomiting or diarrhea. No abdominal pain. No melena or hematochezia.  GENITOURINARY: No dysuria or hematuria.  ENDOCRINE: No polyuria or nocturia. No heat or cold intolerance.  HEMATOLOGY: No anemia. No bruising. No bleeding.  INTEGUMENTARY: No rashes. No lesions.  MUSCULOSKELETAL: No arthritis. No swelling. No gout.  NEUROLOGIC: No numbness, tingling, or ataxia. No seizure-type activity.  PSYCHIATRIC: No anxiety. No insomnia. No ADD.   DRUG ALLERGIES:   Allergies  Allergen Reactions  . Prednisone Other (See Comments)    Reaction: "organs shutting down"    VITALS:  Blood pressure 121/78, pulse (!) 111, temperature 97.9 F (36.6 C), temperature source Oral, resp. rate 19, height 5\' 3"  (1.6 m), weight 76.3 kg, SpO2 100 %.  PHYSICAL EXAMINATION:   Physical Exam  GENERAL:  77 y.o.-year-old patient lying in the bed with no acute distress.  EYES: Pupils equal, round, reactive to light and accommodation. No scleral icterus. Extraocular muscles intact.  HEENT: Head atraumatic, normocephalic. Oropharynx and nasopharynx clear.  NECK:   Supple, no jugular venous distention. No thyroid enlargement, no tenderness.  LUNGS : Proved breath sounds bilaterally, bibasal basal crepitations heard. No use of accessory muscles of respiration.  CARDIOVASCULAR: S1, S2 irregular. No murmurs, rubs, or gallops.  ABDOMEN: Soft, nontender, nondistended. Bowel sounds present. No organomegaly or mass.  EXTREMITIES: No cyanosis, clubbing or edema b/l.    NEUROLOGIC: Cranial nerves II through XII are intact. No focal Motor or sensory deficits b/l.   PSYCHIATRIC: The patient is alert and oriented x 3.  SKIN: No obvious rash, lesion, or ulcer.   LABORATORY PANEL:   CBC Recent Labs  Lab 10/23/18 0527  WBC 10.5  HGB 8.8*  HCT 26.2*  PLT 111*   ------------------------------------------------------------------------------------------------------------------ Chemistries  Recent Labs  Lab 10/23/18 0527  NA 134*  K 4.7  CL 94*  CO2 30  GLUCOSE 130*  BUN 49*  CREATININE 1.33*  CALCIUM 7.4*  MG 1.8  AST 1,461*  ALT 1,934*  ALKPHOS 134*  BILITOT 1.9*   ------------------------------------------------------------------------------------------------------------------  Cardiac Enzymes Recent Labs  Lab 10/21/18 0755  TROPONINI 0.08*   ------------------------------------------------------------------------------------------------------------------  RADIOLOGY:  Dg Chest 1 View  Result Date: 10/21/2018 CLINICAL DATA:  77 y/o  M; aspiration into the respiratory tract. EXAM: CHEST  1 VIEW COMPARISON:  10/21/2018 chest CT and chest radiograph. FINDINGS: Stable right basilar opacity and right-sided pleural effusion. Interstitial pulmonary edema. Right port catheter tip projects over mid SVC. Stable cardiac silhouette given projection and technique. Single lead AICD. Post median sternotomy with wires in alignment. Calcific aortic atherosclerosis. No acute osseous abnormality is evident. IMPRESSION: Stable right basilar opacity corresponding  to mass on CT and right-sided pleural effusion. Interstitial pulmonary edema. Aortic Atherosclerosis (ICD10-I70.0). Electronically Signed   By: Kristine Garbe M.D.   On: 10/21/2018 20:23     ASSESSMENT AND PLAN:   77 year old male patient with history of atrial fibrillation, aortic valve regurgitation, Barrett's esophagus, stage III lung cancer, chronic congestive heart failure, COPD, GERD, gout, hyperlipidemia, hypertension currently in ICU  -Acute ischemic hepatitis  Liver enzymes trending down Gastroenterology follow-up Volume perfusion as permitted and follow-up LFTs  -Supratherapeutic INR secondary to liver failure improved INR goal is 2-3 Pharmacy follow-up for resuming Coumadin dosing  -Acute hyperkalemia resolved  -Right-sided heart failure with congestive hepatopathy with history of aortic valve replacement Management as per cardiology and follow-up echocardiogram  -Acute renal failure on chronic kidney disease stage III Appreciate nephrology evaluation Monitor renal function  -Stage III lung cancer Status post oncology evaluation Further chemotherapy deferred in view of poor cardiopulmonary status  -Atrial fibrillation Status post cardiology evaluation Amiodarone on hold due to hepatocellular failure Maintain INR between 2 and 3 No further intervention recommended  -Pleural effusion Hold off on IV fluids as patient appears little congested Mucinex for expectoration PRN Robitussin for cough  All the records are reviewed and case discussed with Care Management/Social Worker. Management plans discussed with the patient, family and they are in agreement.  CODE STATUS: DNR  DVT Prophylaxis: SCDs  TOTAL TIME TAKING CARE OF THIS PATIENT: 35 minutes.   POSSIBLE D/C IN 2 to 3 DAYS, DEPENDING ON CLINICAL CONDITION.  Saundra Shelling M.D on 10/23/2018 at 11:07 AM  Between 7am to 6pm - Pager - [phone]  After 6pm go to [email or web address] - password [email or web address]  [facility]  Office  [phone]  CC: Primary care physician; Guadalupe Maple, MD  Note: This dictation was prepared with Dragon dictation along with smaller phrase technology. Any transcriptional errors that result from this process are unintentional.

## 2018-10-23 NOTE — Progress Notes (Signed)
[facility]  ROUNDING NOTE   Subjective:   Many family members at bedside. Brother and sister Patient states he is feeling better wants to get out of bed and sit in bedside chair but restricted due to tachycardia + Edema  Objective:  Vital signs in last 24 hours:  Temp:  [97.5 F (36.4 C)-98.3 F (36.8 C)] 98.3 F (36.8 C) (11/25 1302) Pulse Rate:  [57-127] 124 (11/25 1415) Resp:  [17-20] 18 (11/25 1302) BP: (110-138)/(74-90) 110/90 (11/25 1415) SpO2:  [92 %-100 %] 96 % (11/25 1302) Weight:  [76.3 kg-77 kg] 76.3 kg (11/25 0500)  Weight change: 0.9 kg Filed Weights   10/22/18 0500 10/22/18 1836 10/23/18 0500  Weight: 76.3 kg 77 kg 76.3 kg    Intake/Output: I/O last 3 completed shifts: In: 864.2 [P.O.:360; I.V.:504.2] Out: 1175 [Urine:1175]   Intake/Output this shift:  Total I/O In: 120 [P.O.:120] Out: 350 [Urine:350]  Physical Exam: General: NAD,   Head: Normocephalic, atraumatic. Moist oral mucosal membranes  Eyes: Anicteric,  Neck: Supple, trachea midline  Lungs:  Mild diffuse crackles  Heart: Irregular rhythm   Abdomen:  Soft, nontender,   Extremities: 21+ peripheral edema.  Neurologic:  alert, oreinted  Skin: No lesions        Basic Metabolic Panel: Recent Labs  Lab 10/21/18 1946 10/21/18 2326 10/22/18 0724 10/22/18 1424 10/22/18 2309 10/23/18 0527  NA 132* 132* 133* 131*  --  134*  K 5.0 4.9 5.1 4.9  --  4.7  CL 91* 92* 90* 91*  --  94*  CO2 32 28 29 30   --  30  GLUCOSE 127* 117* 111* 141*  --  130*  BUN 45* 46* 47* 50*  --  49*  CREATININE 1.65* 1.55* 1.57* 1.46*  --  1.33*  CALCIUM 7.1* 6.9* 7.3* 7.1*  --  7.4*  MG 1.8 1.7 1.7 1.7 1.8 1.8  PHOS 5.6* 5.3* 5.6* 4.7* 4.5 4.4    Liver Function Tests: Recent Labs  Lab 10/21/18 1337 10/21/18 1946 10/21/18 2326 10/22/18 0724 10/23/18 0527  AST 4,378* 4,530* 4,382* 3,621* 1,461*  ALT 2,725* 2,794* 2,817* 2,791* 1,934*  ALKPHOS 121 129* 130* 138* 134*  BILITOT 1.6* 1.6*  1.5* 2.1* 1.9*  PROT 5.4* 5.5* 5.4* 5.5* 5.1*  ALBUMIN 2.8* 2.8* 2.8* 3.0* 2.6*   Recent Labs  Lab 10/21/18 0235  LIPASE 32   Recent Labs  Lab 10/21/18 0235  AMMONIA 94*    CBC: Recent Labs  Lab 10/21/18 2326 10/22/18 0724 10/22/18 1452 10/22/18 2309 10/23/18 0527  WBC 9.9 11.1* 11.6* 11.2* 10.5  NEUTROABS 9.7* 10.9* 11.4* 11.0* 10.2*  HGB 9.0* 9.3* 8.7* 8.8* 8.8*  HCT 26.3* 27.8* 26.4* 26.8* 26.2*  MCV 92.0 92.7 94.0 94.0 92.9  PLT 117* 117* 114* 111* 111*    Cardiac Enzymes: Recent Labs  Lab 10/21/18 0004 10/21/18 0235 10/21/18 0755  TROPONINI 0.07* 0.07* 0.08*    BNP: Invalid input(s): POCBNP  CBG: Recent Labs  Lab 10/22/18 1204 10/22/18 1649 10/22/18 2117 10/23/18 0742 10/23/18 1140  GLUCAP 115* 119* 126* 122* 128*    Microbiology: Results for orders placed or performed during the hospital encounter of 10/20/18  MRSA PCR Screening     Status: None   Collection Time: 10/21/18 11:27 AM  Result Value Ref Range Status   MRSA by PCR NEGATIVE NEGATIVE Final    Comment:        The GeneXpert MRSA Assay (FDA approved for NASAL specimens only), is one component of a  comprehensive MRSA colonization surveillance program. It is not intended to diagnose MRSA infection nor to guide or monitor treatment for MRSA infections. Performed at [facility], [address] [place], [place] [address]     Coagulation Studies: Recent Labs    10/21/18 2326 10/22/18 0724 10/22/18 1451 10/22/18 2309 10/23/18 0849  LABPROT 60.9* 46.5* 43.2* 39.8* 38.0*  INR 7.26* 5.13* 4.67* 4.19* 3.95    Urinalysis: No results for input(s): COLORURINE, LABSPEC, PHURINE, GLUCOSEU, HGBUR, BILIRUBINUR, KETONESUR, PROTEINUR, UROBILINOGEN, NITRITE, LEUKOCYTESUR in the last 72 hours.  Invalid input(s): APPERANCEUR    Imaging: Dg Chest 1 View  Result Date: 10/21/2018 CLINICAL DATA:  77 y/o  M; aspiration into the respiratory tract. EXAM: CHEST  1 VIEW  COMPARISON:  10/21/2018 chest CT and chest radiograph. FINDINGS: Stable right basilar opacity and right-sided pleural effusion. Interstitial pulmonary edema. Right port catheter tip projects over mid SVC. Stable cardiac silhouette given projection and technique. Single lead AICD. Post median sternotomy with wires in alignment. Calcific aortic atherosclerosis. No acute osseous abnormality is evident. IMPRESSION: Stable right basilar opacity corresponding to mass on CT and right-sided pleural effusion. Interstitial pulmonary edema. Aortic Atherosclerosis (ICD10-I70.0). Electronically Signed   By: Kristine Garbe M.D.   On: 10/21/2018 20:23     Medications:    . finasteride  5 mg Oral Daily  . guaiFENesin  600 mg Oral BID  . insulin aspart  0-5 Units Subcutaneous QHS  . insulin aspart  0-9 Units Subcutaneous TID WC  . metoprolol succinate  25 mg Oral Daily  . pantoprazole  40 mg Oral Daily  . [START ON 10/24/2018] tiotropium  18 mcg Inhalation Daily   bisacodyl, guaiFENesin-dextromethorphan, senna-docusate  Assessment/ Plan:  Mr. Danyal Whitenack is a 77 y.o. white male with lung cancer, atrial fibrillation on warfarin, aortic valve replacement, diabetes mellitus type II, hypertension, hyperlipidemia, coronary artery disease, AICD, Barrett's esophagus  1. Acute renal faiure with hyperkalemia and hyponatremia on chronic kidney disease stage III Baseline creatinine of 0.99 on 10/14/18. Acute renal failure preceded this admission.  Holding home furosemide dose.  - Discontinue bicarb gtt - starting to accumulate fluid. Recommend d/c all supplemental maintenance fluid  2. LE edema - monitor volume status  3. A Fib - rate control attempted with toprol     LOS: 2 Shizuye Rupert 11/25/20194:22 PM

## 2018-10-23 NOTE — Consult Note (Signed)
[place]  [phone] [phone]   Name: Kirk Jones [facility] Date: 10/23/2018 MRN: [id number]  DOB: [birth year]  Patient Care Team: Guadalupe Maple, MD as PCP - General (Family Medicine) Murlean Iba, MD (Internal Medicine) Isaias Cowman, MD as Consulting Physician (Cardiology) Christene Lye, MD (General Surgery) Telford Nab, RN as Registered Nurse Knox Royalty, RN as [place]: Palliative Care consult requested for this 77 y.o. male with multiple medical problems including undergoing concurrent chemoradiation treated with carbotaxol, severe aortic valve disease status post valve replacement 2004, CAD status post PCI, ischemic cardiomyopathy status post AICD insertion in 2012 with history of CHF.  PAF on warfarin, history of AAA, and CKD.  Patient was hospitalized on 10/11/2018 to 10/15/18 with syncope with sustained laceration to his head requiring sutures.  Workup revealed SVT and patient was started on amiodarone. He is hospitalized again 10/21/18 with shortness of breath and weakness. Workup revealed acute liver failure likely from congestive hepatopathy and acute on chronic kidney disease. Palliative care was consulted to help address goals.   SOCIAL HISTORY:    Patient is married lives at home with his wife.  He has 2 adult daughters from a previous marriage.  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  Patient would also want daughters to be involved in decision-making.  Patient has a living will.  CODE STATUS: DNR  PAST MEDICAL HISTORY: Past Medical History:  Diagnosis Date  . AICD (automatic cardioverter/defibrillator) present   . Aortic valve regurgitation   . Atrial fibrillation  ([place])   . Barrett's esophagus   . Cancer ([place])    sate III lung  . CHF (congestive heart failure) ([place])   . COPD (chronic obstructive pulmonary disease) ([place])   . Diverticulosis   . Dysrhythmia   . GERD (gastroesophageal reflux disease)   . Gout   . Headache    aura only  . Heart murmur   . Hematuria   . Hernia of abdominal cavity   . History of prosthetic heart valve   . Hyperlipidemia   . Hypertension   . Nodular prostate with urinary obstruction   . Presence of permanent cardiac pacemaker   . Renal insufficiency   . Substance abuse ([place])    alcohol    PAST SURGICAL HISTORY:  Past Surgical History:  Procedure Laterality Date  . CARDIAC VALVE REPLACEMENT N/A 2004  . CARPAL TUNNEL RELEASE Right 2015  . CATARACT EXTRACTION Right 2017  . COLONOSCOPY WITH PROPOFOL N/A 2014  . CORONARY ANGIOPLASTY     stints x2  . EYE SURGERY     cross eyed  . FOOT SURGERY Bilateral    [place]  . FRACTURE SURGERY Right    femur  . INGUINAL HERNIA REPAIR Right 09/16/2017   Procedure: HERNIA REPAIR INGUINAL ADULT;  Surgeon: Christene Lye, MD;  Location: [facility] ORS;  Service: General;  Laterality: Right;  . INSERT / REPLACE / [place]     ICD  . PACEMAKER IMPLANT Left 2013   and ICD  . PORTA CATH INSERTION N/A 09/21/2018   Procedure: PORTA CATH INSERTION;  Surgeon: Algernon Huxley, MD;  Location: [facility];  Service: Cardiovascular;  Laterality: N/A;  . VASECTOMY      HEMATOLOGY/ONCOLOGY  HISTORY:    Squamous cell carcinoma of right lung ([place])   09/14/2018 Cancer Staging    Staging form: Lung, AJCC 8th Edition - Clinical stage from 09/14/2018: Stage IIIA (cT3, cN1, cM0) - Signed by Sindy Guadeloupe, MD on 09/16/2018    09/16/2018 Initial Diagnosis    Squamous cell carcinoma of right lung ([place])    09/27/2018 -  Chemotherapy    The patient had palonosetron (ALOXI) injection 0.25 mg, 0.25 mg, Intravenous,  Once, 3 of 4 cycles Administration: 0.25 mg  (09/28/2018), 0.25 mg (10/05/2018), 0.25 mg (10/19/2018) CARBOplatin (PARAPLATIN) 180 mg in sodium chloride 0.9 % 250 mL chemo infusion, 180 mg (100 % of original dose 182.6 mg), Intravenous,  Once, 3 of 4 cycles Dose modification:   (original dose 182.6 mg, Cycle 1) Administration: 180 mg (09/28/2018), 160 mg (10/05/2018), 110 mg (10/19/2018) PACLitaxel (TAXOL) 84 mg in sodium chloride 0.9 % 250 mL chemo infusion (</= 80mg /m2), 45 mg/m2 = 84 mg, Intravenous,  Once, 3 of 4 cycles Administration: 84 mg (09/28/2018), 84 mg (10/05/2018), 84 mg (10/19/2018)  for chemotherapy treatment.      ALLERGIES:  is allergic to prednisone.  MEDICATIONS:  Current Facility-Administered Medications  Medication Dose Route Frequency Provider Last Rate Last Dose  . bisacodyl (DULCOLAX) EC tablet 5 mg  5 mg Oral Daily PRN Arta Silence, MD      . finasteride (PROSCAR) tablet 5 mg  5 mg Oral Daily Arta Silence, MD   5 mg at 10/23/18 0811  . guaiFENesin (MUCINEX) 12 hr tablet 600 mg  600 mg Oral BID Saundra Shelling, MD   600 mg at 10/23/18 0949  . guaiFENesin-dextromethorphan (ROBITUSSIN DM) 100-10 MG/5ML syrup 5 mL  5 mL Oral Q4H PRN Pyreddy, Pavan, MD      . insulin aspart (novoLOG) injection 0-5 Units  0-5 Units Subcutaneous QHS Sridharan, Prasanna, MD      . insulin aspart (novoLOG) injection 0-9 Units  0-9 Units Subcutaneous TID WC Arta Silence, MD   1 Units at 10/23/18 1228  . metoprolol succinate (TOPROL-XL) 24 hr tablet 25 mg  25 mg Oral Daily Pyreddy, Pavan, MD      . pantoprazole (PROTONIX) EC tablet 40 mg  40 mg Oral Daily Arta Silence, MD   40 mg at 10/23/18 0811  . senna-docusate (Senokot-S) tablet 1 tablet  1 tablet Oral QHS PRN Arta Silence, MD      . Derrill Memo ON 10/24/2018] tiotropium (SPIRIVA) inhalation capsule ([facility] use ONLY) 18 mcg  18 mcg Inhalation Daily Pyreddy, Reatha Harps, MD       Facility-Administered Medications Ordered in Other Encounters  Medication Dose Route  Frequency Provider Last Rate Last Dose  . 0.9 %  sodium chloride infusion   Intravenous Continuous Bruning, Lennette Bihari, PA-C        VITAL SIGNS: BP 138/74 (BP Location: Right Arm)   Pulse (!) 122   Temp 98.3 F (36.8 C)   Resp 18   Ht 5\' 3"  (1.6 m)   Wt 168 lb 4.8 oz (76.3 kg)   SpO2 96%   BMI 29.81 kg/m  Filed Weights   10/22/18 0500 10/22/18 1836 10/23/18 0500  Weight: 168 lb 3.4 oz (76.3 kg) 169 lb 12.1 oz (77 kg) 168 lb 4.8 oz (76.3 kg)    Estimated body mass index is 29.81 kg/m as calculated from the following:   Height as of this encounter: 5\' 3"  (1.6 m).   Weight as of this encounter: 168 lb 4.8 oz (76.3 kg).  LABS: CBC:    Component Value Date/Time   WBC 10.5 10/23/2018 0527   HGB 8.8 (L) 10/23/2018 0527   HGB 13.6 11/10/2017 1328   HCT 26.2 (L) 10/23/2018 0527   HCT 40.8 11/10/2017 1328   PLT 111 (L) 10/23/2018 0527   PLT 255 11/10/2017 1328   MCV 92.9 10/23/2018 0527   MCV 103 (H) 11/10/2017 1328   MCV 106 (H) 11/20/2014 0549   NEUTROABS 10.2 (H) 10/23/2018 0527   NEUTROABS 5.1 11/10/2017 1328   NEUTROABS 4.7 11/20/2014 0549   LYMPHSABS 0.0 (L) 10/23/2018 0527   LYMPHSABS 0.8 11/10/2017 1328   LYMPHSABS 1.0 11/20/2014 0549   MONOABS 0.0 (L) 10/23/2018 0527   MONOABS 1.0 11/20/2014 0549   EOSABS 0.0 10/23/2018 0527   EOSABS 0.1 11/10/2017 1328   EOSABS 0.2 11/20/2014 0549   BASOSABS 0.0 10/23/2018 0527   BASOSABS 0.1 11/10/2017 1328   BASOSABS 0.1 11/20/2014 0549   Comprehensive Metabolic Panel:    Component Value Date/Time   NA 134 (L) 10/23/2018 0527   NA 136 05/08/2018 1507   NA 140 11/18/2014 0514   K 4.7 10/23/2018 0527   K 4.2 11/18/2014 0514   CL 94 (L) 10/23/2018 0527   CL 106 11/18/2014 0514   CO2 30 10/23/2018 0527   CO2 27 11/18/2014 0514   BUN 49 (H) 10/23/2018 0527   BUN 21 05/08/2018 1507   BUN 17 11/18/2014 0514   CREATININE 1.33 (H) 10/23/2018 0527   CREATININE 1.35 (H) 11/18/2014 0514   GLUCOSE 130 (H) 10/23/2018 0527    GLUCOSE 93 11/18/2014 0514   CALCIUM 7.4 (L) 10/23/2018 0527   CALCIUM 8.5 11/18/2014 0514   AST 1,461 (H) 10/23/2018 0527   AST 40 (H) 05/08/2018 1122   AST 68 (H) 11/16/2014 0031   ALT 1,934 (H) 10/23/2018 0527   ALT 23 05/08/2018 1122   ALT 102 (H) 11/16/2014 0031   ALKPHOS 134 (H) 10/23/2018 0527   ALKPHOS 614 (H) 11/16/2014 0031   BILITOT 1.9 (H) 10/23/2018 0527   BILITOT 0.5 10/24/2017 0910   BILITOT 0.6 11/16/2014 0031   PROT 5.1 (L) 10/23/2018 0527   PROT 6.7 10/24/2017 0910   PROT 7.2 11/16/2014 0031   ALBUMIN 2.6 (L) 10/23/2018 0527   ALBUMIN 4.4 10/24/2017 0910   ALBUMIN 3.1 (L) 11/16/2014 0031    RADIOGRAPHIC STUDIES: Dg Chest 1 View  Result Date: 10/21/2018 CLINICAL DATA:  77 y/o  M; aspiration into the respiratory tract. EXAM: CHEST  1 VIEW COMPARISON:  10/21/2018 chest CT and chest radiograph. FINDINGS: Stable right basilar opacity and right-sided pleural effusion. Interstitial pulmonary edema. Right port catheter tip projects over mid SVC. Stable cardiac silhouette given projection and technique. Single lead AICD. Post median sternotomy with wires in alignment. Calcific aortic atherosclerosis. No acute osseous abnormality is evident. IMPRESSION: Stable right basilar opacity corresponding to mass on CT and right-sided pleural effusion. Interstitial pulmonary edema. Aortic Atherosclerosis (ICD10-I70.0). Electronically Signed   By: Kristine Garbe M.D.   On: 10/21/2018 20:23   Ct Head Wo Contrast  Result Date: 10/12/2018 CLINICAL DATA:  Acute onset of dizziness and fall. Hit head on headboard. Laceration at the right temple. Patient on Coumadin. Initial encounter. EXAM: CT HEAD WITHOUT CONTRAST TECHNIQUE: Contiguous axial images were obtained from the base of the skull through the vertex without intravenous contrast. COMPARISON:  CT of the head performed 09/04/2018 FINDINGS: Brain: No evidence of acute infarction, hemorrhage, hydrocephalus, extra-axial collection  or mass lesion / mass effect.  Prominence of the ventricles and sulci reflects mild to moderate cortical volume loss. Cerebellar atrophy is noted. Scattered periventricular and subcortical white matter change likely reflects small vessel ischemic microangiopathy. A small chronic lacunar infarct is noted at the left cerebellar hemisphere. The brainstem and fourth ventricle are within normal limits. The basal ganglia are unremarkable in appearance. The cerebral hemispheres demonstrate grossly normal gray-white differentiation. No mass effect or midline shift is seen. Vascular: No hyperdense vessel or unexpected calcification. Skull: There is no evidence of fracture; visualized osseous structures are unremarkable in appearance. Sinuses/Orbits: The visualized portions of the orbits are within normal limits. The paranasal sinuses and mastoid air cells are well-aerated. Other: A prominent soft tissue laceration is noted anterior to the right ear, with soft tissue air extending below the right ear. IMPRESSION: 1. No evidence of traumatic intracranial injury or fracture. 2. Prominent soft tissue laceration anterior to the right ear, with soft tissue air extending below the right ear. 3. Mild to moderate cortical volume loss and scattered small vessel ischemic microangiopathy. 4. Small chronic lacunar infarct at the left cerebellar hemisphere. Electronically Signed   By: Garald Balding M.D.   On: 10/12/2018 00:18   Ct Chest Wo Contrast  Result Date: 10/21/2018 CLINICAL DATA:  Shortness of breath and weakness. EXAM: CT CHEST WITHOUT CONTRAST TECHNIQUE: Multidetector CT imaging of the chest was performed following the standard protocol without IV contrast. COMPARISON:  08/21/2018 FINDINGS: Cardiovascular: Evaluation of vascular structures is limited without IV contrast material. Cardiac pacemaker. Diffuse cardiac enlargement. No pericardial effusions. Coronary artery calcifications. Postoperative changes in the  mediastinum. Calcification throughout the aorta. Mediastinum/Nodes: Moderate prominent lymph nodes throughout the mediastinum without change since prior study, likely reactive. Lungs/Pleura: Bilateral pleural effusions, greater on the right. Atelectasis in the lung bases. Known right lower lobe mass is visible but somewhat obscured by the effusion and atelectasis. Emphysematous changes in the lungs. No pneumothorax. Airways are patent. Upper Abdomen: Probable hepatic cirrhosis. Free fluid in the right upper quadrant likely represents ascites. Musculoskeletal: Degenerative changes in the spine. No destructive bone lesions. Postoperative sternotomy wires. IMPRESSION: Known mass in the right lung base. Cardiac enlargement. Bilateral pleural effusions with basilar atelectasis, greater on the right. Mild progression on the right since previous study. Hepatic cirrhosis with upper abdominal ascites. Electronically Signed   By: Lucienne Capers M.D.   On: 10/21/2018 06:53   Dg Hand 2 View Left  Result Date: 10/12/2018 CLINICAL DATA:  Pt reports he had a fall last night and is now experiencing pain in his left hand. Pt hand appears swollen and red at this time EXAM: LEFT HAND - 2 VIEW COMPARISON:  None. FINDINGS: IV tubing overlies the wrist. There are degenerative changes insert wrist, primarily along the radial aspect of the carpals and the first carpometacarpal joint. There is degenerative change at the second metacarpophalangeal joint. No acute fracture or subluxation. Atherosclerotic calcification of the small arteries. IMPRESSION: No evidence for acute  abnormality.  Degenerative changes. Electronically Signed   By: Nolon Nations M.D.   On: 10/12/2018 15:27   US Venous Img Upper Uni Left  Result Date: 10/15/2018 CLINICAL DATA:  Left arm swelling. Recent fall. Left cardiac ICD. Edema from the elbow to the wrist. EXAM: LEFT UPPER EXTREMITY VENOUS DOPPLER ULTRASOUND TECHNIQUE: Gray-scale sonography with  graded compression, as well as color Doppler and duplex ultrasound were performed to evaluate the upper extremity deep venous system from the level of the subclavian vein and including the jugular, axillary, basilic,  radial, ulnar and upper cephalic vein. Spectral Doppler was utilized to evaluate flow at rest and with distal augmentation maneuvers. COMPARISON:  Chest radiograph 10/11/2018 FINDINGS: Contralateral Subclavian Vein: Normal respiratory phasicity. Normal color Doppler flow in the right subclavian vein. No evidence for right subclavian thrombus. Internal Jugular Vein: No evidence of thrombus. Normal compressibility, respiratory phasicity and response to augmentation. Subclavian Vein: The left cardiac ICD lead is noted. There appears to be flow around the lead and left subclavian vein appears to be patent without thrombus. Axillary Vein: No evidence of thrombus. Normal compressibility, respiratory phasicity and response to augmentation. Cephalic Vein: No evidence of thrombus. Normal compressibility, respiratory phasicity and response to augmentation. Basilic Vein: No evidence of thrombus. Normal compressibility, respiratory phasicity and response to augmentation. Brachial Veins: No evidence of thrombus. Normal compressibility, respiratory phasicity and response to augmentation. Radial Veins: No evidence of thrombus. Normal compressibility, respiratory phasicity and response to augmentation. Single radial vein is identified. Ulnar Veins: No evidence of thrombus. Normal compressibility, respiratory phasicity and response to augmentation. Single ulnar vein is identified. Venous Reflux:  None visualized. Other Findings:  Subcutaneous edema. IMPRESSION: No evidence of DVT within the left upper extremity. Limited evaluation of the left subclavian vein due to the cardiac ICD but no evidence for thrombus in this area. Electronically Signed   By: Markus Daft M.D.   On: 10/15/2018 14:58   Ct Abdomen W Wo  Contrast  Result Date: 09/25/2018 CLINICAL DATA:  Evaluate pancreatic calcifications EXAM: CT ABDOMEN WITHOUT AND WITH CONTRAST TECHNIQUE: Multidetector CT imaging of the abdomen was performed following the standard protocol before and following the bolus administration of intravenous contrast. CONTRAST:  150mL ISOVUE-300 IOPAMIDOL (ISOVUE-300) INJECTION 61% COMPARISON:  PET-CT 08/30/2018. FINDINGS: Lower chest: Large right lower lobe lung mass is again identified measuring 7.9 cm. Loculated pleural fluid is identified overlying the right midlung and right lower lobe. Small left pleural effusion noted. Aortic atherosclerosis is identified. Hepatobiliary: No focal liver abnormality. The gallbladder is not confidently identified and may be surgically absent. Pancreas: No pancreatic inflammation identified faint punctate areas of mild hyperattenuation within the head of pancreas are again identified within head of pancreas, image 39/2 and may represent small calcifications. 1.4 cm low-density structure within the head of pancreas is identified measuring 32 HU, image 96/14. This is favored to represent a mildly dilated common bile duct. This roughly corresponds to the areas of calcification within the head of pancreas which may reflect choledocholithiasis. Body and tail of pancreas appear normal. Spleen: Spleen normal. Adrenals/Urinary Tract: Unremarkable appearance of the adrenal glands. There is a cyst arising from inferior pole of right kidney measuring 3 cm, image 46/11. Smaller cysts noted within the upper pole of the right kidney. Multiple areas of cortical scarring involve the left kidney. Stomach/Bowel: Stomach and small bowel loops have a normal course and caliber. Extensive colonic diverticulosis noted. Vascular/Lymphatic: Aortic atherosclerosis. No aneurysm. No adenopathy identified within the abdomen. Other: Trace free fluid identified along the pericolic gutters. Musculoskeletal: Mild thoraco lumbar  scoliosis and multilevel degenerative disc disease. IMPRESSION: 1. Faint calcifications within head of pancreas are again noted. There is cystic area of low attenuation within the head of pancreas which may represent a mildly dilated common bile duct. Calcifications may represent choledocholithiasis. In a patient who cannot have an MRI due to pacer device ERCP may be helpful to confirm common bile duct stones. 2. Large right lower lobe lung mass with surrounding loculated pleural fluid 3.  Aortic Atherosclerosis (ICD10-I70.0). Electronically  Signed   By: Kerby Moors M.D.   On: 09/25/2018 16:27   Dg Chest Port 1 View  Result Date: 10/21/2018 CLINICAL DATA:  Shortness of breath. EXAM: PORTABLE CHEST 1 VIEW COMPARISON:  Chest x-rays dated 10/11/2018 and 08/10/2018. FINDINGS: Increased opacity at the RIGHT lung base. Probable mild atelectasis and/or small pleural effusion at the LEFT lung base. No pneumothorax seen. No acute or suspicious osseous finding. Stable cardiomegaly. RIGHT chest wall Port-A-Cath appears stable in position with tip at the level of the mid/lower SVC. LEFT chest wall pacemaker/ICD apparatus is stable in position. Median sternotomy wires appear intact and stable in alignment. IMPRESSION: 1. Increasing opacity at the RIGHT lung base. This is mostly related to the RIGHT lower lobe mass identified on recent chest CT (08/21/2018) and PET-CT (08/30/2018), likely with superimposed pleural effusion and/or atelectasis that has slightly increased in the interval. 2. Stable cardiomegaly. 3. Probable mild atelectasis and/or small pleural effusion at the LEFT lung base. Electronically Signed   By: Franki Cabot M.D.   On: 10/21/2018 00:32   Dg Chest Port 1 View  Result Date: 10/12/2018 CLINICAL DATA:  Status post fall, with concern for chest injury. Initial encounter. EXAM: PORTABLE CHEST 1 VIEW COMPARISON:  CT of the chest performed 08/21/2018 FINDINGS: There is a persistent small right-sided  pleural effusion. The known right lower lobe mass is better characterized on prior CT. There is suggestion of underlying interstitial edema, which may be transient in nature. No pneumothorax is seen. The cardiomediastinal silhouette is borderline normal in size. The patient is status post median sternotomy. An AICD is noted overlying the left chest wall, with a single lead ending overlying the right ventricle. The patient's right chest port is noted ending about the mid SVC. No displaced rib fractures are seen. IMPRESSION: 1. Persistent small right-sided pleural effusion. Known right lower lobe mass is better characterized on prior CT. Suggestion of underlying interstitial edema, which may be transient in nature. 2. No displaced rib fracture seen. Electronically Signed   By: Garald Balding M.D.   On: 10/12/2018 00:22   US Liver Doppler  Result Date: 10/21/2018 CLINICAL DATA:  Abnormal transaminases. Patient undergoing chemotherapy and radiation for stage III lung cancer. EXAM: DUPLEX ULTRASOUND OF LIVER TECHNIQUE: Color and duplex Doppler ultrasound was performed to evaluate the hepatic in-flow and out-flow vessels. COMPARISON:  CT abdomen 09/25/2018 FINDINGS: Gallbladder: The gallbladder is not definitively identified. In the gallbladder fossa, there is a structure which is could represent a severely contracted gallbladder. No significant gallbladder lumen is visualized. No stones are identified. Murphy's sign is negative. Common bile duct: Diameter: 4.1 mm, normal Liver: normal parenchymal echogenicity. Normal hepatic contour without nodularity. No focal lesion, mass or intrahepatic biliary ductal dilatation. Portal Vein Velocities Main:  29 cm/sec Right:  39 cm/sec Left:  116 cm/sec Main portal vein demonstrates bidirectional flow with intermittent hepatopetal and hepatofugal flow direction. Right and left portal veins demonstrate normal hepatopetal flow direction. Hepatic Vein Velocities Right:  32 cm/sec  Middle:  56 cm/sec Left:  30 cm/sec Normal flow direction demonstrated in the hepatic veins. IVC: Present and patent with normal respiratory phasicity. Velocity measures 36 centimeters/second. Hepatic Artery Velocity:  130/32 cm/sec Splenic Vein Velocity:  48 cm/sec Varices: Not identified. Ascites: Minimal fluid over the liver edge. Incidental note of small right pleural effusion. IMPRESSION: 1. Gallbladder is markedly contracted, likely to be physiologic but nonspecific. No stones identified. 2. Normal flow velocities demonstrated in the portal and hepatic veins. Bidirectional  flow is demonstrated in the main portal vein. Bidirectional flow may be due to Valsalva or early portal hypertension. 3. Right pleural effusion and small amount of ascites. Electronically Signed   By: Lucienne Capers M.D.   On: 10/21/2018 05:26   US Abdomen Limited Ruq  Result Date: 10/21/2018 CLINICAL DATA:  Abnormal transaminases. Patient undergoing chemotherapy and radiation for stage III lung cancer. EXAM: DUPLEX ULTRASOUND OF LIVER TECHNIQUE: Color and duplex Doppler ultrasound was performed to evaluate the hepatic in-flow and out-flow vessels. COMPARISON:  CT abdomen 09/25/2018 FINDINGS: Gallbladder: The gallbladder is not definitively identified. In the gallbladder fossa, there is a structure which is could represent a severely contracted gallbladder. No significant gallbladder lumen is visualized. No stones are identified. Murphy's sign is negative. Common bile duct: Diameter: 4.1 mm, normal Liver: normal parenchymal echogenicity. Normal hepatic contour without nodularity. No focal lesion, mass or intrahepatic biliary ductal dilatation. Portal Vein Velocities Main:  29 cm/sec Right:  39 cm/sec Left:  116 cm/sec Main portal vein demonstrates bidirectional flow with intermittent hepatopetal and hepatofugal flow direction. Right and left portal veins demonstrate normal hepatopetal flow direction. Hepatic Vein Velocities Right:  32  cm/sec Middle:  56 cm/sec Left:  30 cm/sec Normal flow direction demonstrated in the hepatic veins. IVC: Present and patent with normal respiratory phasicity. Velocity measures 36 centimeters/second. Hepatic Artery Velocity:  130/32 cm/sec Splenic Vein Velocity:  48 cm/sec Varices: Not identified. Ascites: Minimal fluid over the liver edge. Incidental note of small right pleural effusion. IMPRESSION: 1. Gallbladder is markedly contracted, likely to be physiologic but nonspecific. No stones identified. 2. Normal flow velocities demonstrated in the portal and hepatic veins. Bidirectional flow is demonstrated in the main portal vein. Bidirectional flow may be due to Valsalva or early portal hypertension. 3. Right pleural effusion and small amount of ascites. Electronically Signed   By: Lucienne Capers M.D.   On: 10/21/2018 05:26    PERFORMANCE STATUS (ECOG) : 3 - Symptomatic, >50% confined to bed  Review of Systems As noted above. Otherwise, a complete review of systems is negative.  Physical Exam General: NAD, frail appearing Cardiovascular: Irregularly irregular Pulmonary: clear ant fields, labored with exertion Abdomen: soft, nontender, + bowel sounds GU: no suprapubic tenderness Extremities: bilat LE edema, TED hose Skin: no rashes Neurological: Weakness but otherwise nonfocal  IMPRESSION: Patient is known to me from his previous hospitalization and the clinic. Oncology consult noted and treatment is on hold for now due to recent decline. Patient has previously desired continuation of treatment for as long as possible. His immediate goals seem consistent with the resolution of his current medical problems and continued medical treatment while hospitalized. However, will return to speak with his wife to further conversations about goals.   Symptomatically, patient says he generally feels bad but does not elucidate on specifics. He denies pain and shortness of breath. He has some congestion and  cough.   PLAN: Continue medical treatment Will follow.    Time Total: 30 minutes  Visit consisted of counseling and education dealing with the complex and emotionally intense issues of symptom management and palliative care in the setting of serious and potentially life-threatening illness.Greater than 50%  of this time was spent counseling and coordinating care related to the above assessment and plan.  Signed by: Altha Harm, PhD, DNP, NP-C, [facility] [phone] (Work Cell)

## 2018-10-23 NOTE — Telephone Encounter (Signed)
Looks like you're seeing him tomorrow for his hospital follow up

## 2018-10-23 NOTE — Progress Notes (Signed)
Kirk Antigua, MD [address] [place], [place], [place], [address] [address], [place], [place], [place], [address] Phone: [phone]  Fax: [phone]   Subjective: Patient reports feeling better.  No abdominal pain.  No nausea or vomiting.  No jaundice.   Objective: Exam: Vital signs in last 24 hours: Vitals:   10/22/18 2045 10/23/18 0346 10/23/18 0500 10/23/18 0909  BP:  115/83  121/78  Pulse: (!) 116 (!) 127  (!) 111  Resp:  20  19  Temp:  (!) 97.5 F (36.4 C)  97.9 F (36.6 C)  TempSrc:  Oral  Oral  SpO2:  100%    Weight:   76.3 kg   Height:       Weight change: 0.9 kg  Intake/Output Summary (Last 24 hours) at 10/23/2018 0947 Last data filed at 10/23/2018 3614 Gross per 24 hour  Intake 744.16 ml  Output 1075 ml  Net -330.84 ml    General: No acute distress, AAO x3 Abd: Soft, NT/ND, No HSM Skin: Warm, no rashes Neck: Supple, Trachea midline   Lab Results: Lab Results  Component Value Date   WBC 10.5 10/23/2018   HGB 8.8 (L) 10/23/2018   HCT 26.2 (L) 10/23/2018   MCV 92.9 10/23/2018   PLT 111 (L) 10/23/2018   Micro Results: Recent Results (from the past 240 hour(s))  MRSA PCR Screening     Status: None   Collection Time: 10/21/18 11:27 AM  Result Value Ref Range Status   MRSA by PCR NEGATIVE NEGATIVE Final    Comment:        The GeneXpert MRSA Assay (FDA approved for NASAL specimens only), is one component of a comprehensive MRSA colonization surveillance program. It is not intended to diagnose MRSA infection nor to guide or monitor treatment for MRSA infections. Performed at [facility], [address] [place], [place] [address]    Studies/Results: Dg Chest 1 View  Result Date: 10/21/2018 CLINICAL DATA:  77 y/o  M; aspiration into the respiratory tract. EXAM: CHEST  1 VIEW COMPARISON:  10/21/2018 chest CT and chest radiograph. FINDINGS: Stable right basilar opacity and right-sided pleural effusion.  Interstitial pulmonary edema. Right port catheter tip projects over mid SVC. Stable cardiac silhouette given projection and technique. Single lead AICD. Post median sternotomy with wires in alignment. Calcific aortic atherosclerosis. No acute osseous abnormality is evident. IMPRESSION: Stable right basilar opacity corresponding to mass on CT and right-sided pleural effusion. Interstitial pulmonary edema. Aortic Atherosclerosis (ICD10-I70.0). Electronically Signed   By: Kirk Jones M.D.   On: 10/21/2018 20:23   Medications:  Scheduled Meds: . finasteride  5 mg Oral Daily  . guaiFENesin  600 mg Oral BID  . insulin aspart  0-5 Units Subcutaneous QHS  . insulin aspart  0-9 Units Subcutaneous TID WC  . pantoprazole  40 mg Oral Daily  . phytonadione  2.5 mg Oral Once   Continuous Infusions: PRN Meds:.bisacodyl, guaiFENesin-dextromethorphan, senna-docusate   Assessment: Active Problems:   Transaminasemia   Hyperkalemia   Supratherapeutic INR    Plan: Liver enzymes have significantly improved today, although they are still very elevated  Given his low EF on echo, diffuse hypokinesis, his elevated liver enzymes are likely multifactorial from possible ischemic hepatitis, and hepatic congestion from heart failure  Optimize cardiac function, would defer to cardiology  Amiodarone is also being held by cardiology Continue daily CMP Avoid hepatotoxic drugs Cardiology and renal following Acute hepatitis panel pending INR improved as well  Patient should  follow-up in GI clinic with Dr. Vicente Jones upon discharge   LOS: 2 days   Kirk Antigua, MD 10/23/2018, 9:47 AM

## 2018-10-23 NOTE — Progress Notes (Signed)
ANTICOAGULATION CONSULT NOTE - Initial Consult  Pharmacy Consult for Warfarin Indication: atrial fibrillation  Allergies  Allergen Reactions  . Prednisone Other (See Comments)    Reaction: "organs shutting down"    Patient Measurements: Height: 5\' 3"  (160 cm) Weight: 168 lb 4.8 oz (76.3 kg) IBW/kg (Calculated) : 56.9  Vital Signs: Temp: 97.9 F (36.6 C) (11/25 0909) Temp Source: Oral (11/25 0909) BP: 121/78 (11/25 0909) Pulse Rate: 111 (11/25 0909)  Labs: Recent Labs    10/21/18 0004 10/21/18 0235 10/21/18 0755  10/22/18 0724 10/22/18 1424 10/22/18 1451 10/22/18 1452 10/22/18 2309 10/23/18 0527 10/23/18 0849  HGB 9.4* 9.0*  --    < > 9.3*  --   --  8.7* 8.8* 8.8*  --   HCT 29.0* 27.7*  --    < > 27.8*  --   --  26.4* 26.8* 26.2*  --   PLT 150 135*  --    < > 117*  --   --  114* 111* 111*  --   APTT  --   --   --    < > 34  --  35  --  34  --  35  LABPROT 82.0* 81.7*  --    < > 46.5*  --  43.2*  --  39.8*  --  38.0*  INR 10.65* 10.60*  --    < > 5.13*  --  4.67*  --  4.19*  --  3.95  CREATININE 1.97* 1.84*  --    < > 1.57* 1.46*  --   --   --  1.33*  --   TROPONINI 0.07* 0.07* 0.08*  --   --   --   --   --   --   --   --    < > = values in this interval not displayed.    Estimated Creatinine Clearance: 42.6 mL/min (A) (by C-G formula based on SCr of 1.33 mg/dL (H)).   Medical History: Past Medical History:  Diagnosis Date  . AICD (automatic cardioverter/defibrillator) present   . Aortic valve regurgitation   . Atrial fibrillation ([place])   . Barrett's esophagus   . Cancer ([place])    sate III lung  . CHF (congestive heart failure) ([place])   . COPD (chronic obstructive pulmonary disease) ([place])   . Diverticulosis   . Dysrhythmia   . GERD (gastroesophageal reflux disease)   . Gout   . Headache    aura only  . Heart murmur   . Hematuria   . Hernia of abdominal cavity   . History of prosthetic heart valve   . Hyperlipidemia   . Hypertension   . Nodular  prostate with urinary obstruction   . Presence of permanent cardiac pacemaker   . Renal insufficiency   . Substance abuse ([facility])    alcohol    Medications:  Scheduled:  . finasteride  5 mg Oral Daily  . guaiFENesin  600 mg Oral BID  . insulin aspart  0-5 Units Subcutaneous QHS  . insulin aspart  0-9 Units Subcutaneous TID WC  . pantoprazole  40 mg Oral Daily   Infusions:   PRN: bisacodyl, guaiFENesin-dextromethorphan, senna-docusate  Assessment: 77 year old male with hx of Afib, presented with SOB and weakness. Transaminasemia holding amiodarone. INR elevated but trending down.  Date INR Warfarin Dose  11/23 7.26 None- vitamin K 5 mg x1   11/24 5.13 None  11/24 4.67 None  11/24 4.19 None  11/25 3.95  Hold     Goal of Therapy:  INR 2-3 Monitor platelets by anticoagulation protocol: Yes   Plan:  Will hold warfarin until INR is < 3. Monitor LFTs. Daily INR and CBC will be ordered.   Oswald Hillock, PharmD  Clinical Pharmacist  10/23/2018,11:36 AM

## 2018-10-24 ENCOUNTER — Ambulatory Visit: Payer: Medicare Other

## 2018-10-24 ENCOUNTER — Inpatient Hospital Stay: Payer: Medicare Other | Admitting: Family Medicine

## 2018-10-24 ENCOUNTER — Inpatient Hospital Stay: Payer: Medicare Other

## 2018-10-24 LAB — CALCIUM, IONIZED
Calcium, Ionized, Serum: 3.9 mg/dL — ABNORMAL LOW (ref 4.5–5.6)
Calcium, Ionized, Serum: 4.1 mg/dL — ABNORMAL LOW (ref 4.5–5.6)
Calcium, Ionized, Serum: 4.1 mg/dL — ABNORMAL LOW (ref 4.5–5.6)
Calcium, Ionized, Serum: 4.2 mg/dL — ABNORMAL LOW (ref 4.5–5.6)
Calcium, Ionized, Serum: 3.5 mg/dL — ABNORMAL LOW (ref 4.5–5.6)
Calcium, Ionized, Serum: 4.3 mg/dL — ABNORMAL LOW (ref 4.5–5.6)

## 2018-10-24 LAB — GLUCOSE, CAPILLARY
GLUCOSE-CAPILLARY: 95 mg/dL (ref 70–99)
Glucose-Capillary: 118 mg/dL — ABNORMAL HIGH (ref 70–99)
Glucose-Capillary: 95 mg/dL (ref 70–99)
Glucose-Capillary: 123 mg/dL — ABNORMAL HIGH (ref 70–99)

## 2018-10-24 LAB — COMPREHENSIVE METABOLIC PANEL
ALBUMIN: 2.8 g/dL — AB (ref 3.5–5.0)
ALK PHOS: 147 U/L — AB (ref 38–126)
ALT: 1478 U/L — ABNORMAL HIGH (ref 0–44)
AST: 645 U/L — ABNORMAL HIGH (ref 15–41)
Anion gap: 8 (ref 5–15)
BUN: 48 mg/dL — AB (ref 8–23)
CALCIUM: 8 mg/dL — AB (ref 8.9–10.3)
CHLORIDE: 94 mmol/L — AB (ref 98–111)
CO2: 32 mmol/L (ref 22–32)
CREATININE: 1.1 mg/dL (ref 0.61–1.24)
GFR calc non Af Amer: >60 mL/min (ref >60)
Glucose, Bld: 116 mg/dL — ABNORMAL HIGH (ref 70–99)
Potassium: 4.8 mmol/L (ref 3.5–5.1)
SODIUM: 134 mmol/L — AB (ref 135–145)
Total Bilirubin: 2.2 mg/dL — ABNORMAL HIGH (ref 0.3–1.2)
Total Protein: 5.4 g/dL — ABNORMAL LOW (ref 6.5–8.1)

## 2018-10-24 LAB — CBC
HEMATOCRIT: 27.4 % — AB (ref 39.0–52.0)
HEMOGLOBIN: 9 g/dL — AB (ref 13.0–17.0)
MCH: 30.9 pg (ref 26.0–34.0)
MCHC: 32.8 g/dL (ref 30.0–36.0)
MCV: 94.2 fL (ref 80.0–100.0)
Platelets: 98 10*3/uL — ABNORMAL LOW (ref 150–400)
RBC: 2.91 MIL/uL — AB (ref 4.22–5.81)
RDW: 16.5 % — ABNORMAL HIGH (ref 11.5–15.5)
WBC: 8.5 10*3/uL (ref 4.0–10.5)
nRBC: 0.2 % (ref 0.0–0.2)

## 2018-10-24 LAB — PROTIME-INR
INR: 4.36
Prothrombin Time: 41 seconds — ABNORMAL HIGH (ref 11.4–15.2)

## 2018-10-24 LAB — APTT: aPTT: 33 seconds (ref 24–36)

## 2018-10-24 NOTE — Evaluation (Signed)
Physical Therapy Evaluation Patient Details Name: Kirk Jones MRN: [id number] DOB: [birth year] Today's Date: 10/24/2018   History of Present Illness  Pt is a 77 y.o. male presenting to hospital 10/20/18 with SOB and weakness; recent fall with L hand skin tear.  Pt admitted with acute ischemic hepatitis, supratherapeutic INR, acute hyperkalemia, R sided heart failure with congestive hepatopathy, and a-fib.  PMH includes stage 3 lung CA on 2-3 L home O2, CHF, COPD, a-fib on warfarin, Barrett's esophagus, AICD, aortic aneurysm, expressive aphasia, CVA, AVR, CTR, h/o R femur fx surgery.  Clinical Impression  Prior to hospital admission, pt was ambulatory with walker household distances; uses 2-3L home O2.  Pt lives with his wife on main level of home with 4 steps to enter with B railing.  Currently pt is CGA to min assist x2 to stand and walk a few feet with youth sized RW bed to recliner.  Utilized 2 assist for safety d/t pt's overall weakness and first time OOB since hospital admission (per pt report).  Pt SOB with activity and HR fluctuating mostly 111-126 bpm but briefly going into 130's (peak 139 bpm) with activity.  Pt would benefit from skilled PT to address noted impairments and functional limitations (see below for any additional details).  Upon hospital discharge, pt would benefit from STR but if pt chooses to discharge home, pt would require 24/7 assist, HHPT, and may need to be w/c level initially depending on pt's progress with strength, endurance, and mobility.    Follow Up Recommendations SNF    Equipment Recommendations  (pt has RW and 4ww at home already)    Recommendations for Other Services OT consult     Precautions / Restrictions Precautions Precautions: Fall Precaution Comments: R chest port Restrictions Weight Bearing Restrictions: No      Mobility  Bed Mobility               General bed mobility comments: Deferred (pt sitting on edge of bed upon PT  arrival).  Transfers Overall transfer level: Needs assistance Equipment used: Rolling walker (2 wheeled)(youth sized) Transfers: Sit to/from Stand Sit to Stand: Min guard;Min assist;+2 safety/equipment         General transfer comment: min assist to stand from bed with increased effort/time to perform (2nd assist for safety); forward posture requiring cueing for upright posture  Ambulation/Gait Ambulation/Gait assistance: Min guard;Min assist;+2 safety/equipment Gait Distance (Feet): 3 Feet(bed to recliner) Assistive device: Rolling walker (2 wheeled)(youth sized)   Gait velocity: decreased   General Gait Details: decreased B step length/foot clearance/heelstrike; forward posture requiring cueing for upright posture  Stairs            Wheelchair Mobility    Modified Rankin (Stroke Patients Only)       Balance Overall balance assessment: Needs assistance;History of Falls Sitting-balance support: Feet supported;No upper extremity supported Sitting balance-Leahy Scale: Good Sitting balance - Comments: steady sitting reaching within BOS   Standing balance support: Bilateral upper extremity supported Standing balance-Leahy Scale: Poor Standing balance comment: pt requiring B UE support for static standing balance                             Pertinent Vitals/Pain Pain Assessment: No/denies pain  O2 95% or greater on 2 L O2 via nasal cannula during session.    Home Living Family/patient expects to be discharged to:: Private residence Living Arrangements: Spouse/significant other Available Help at Discharge: Family;Available 24  hours/day Type of Home: House Home Access: Stairs to enter Entrance Stairs-Rails: Right;Left;Can reach both Entrance Stairs-Number of Steps: 4 Home Layout: Two level;Able to live on main level with bedroom/bathroom(does not use 2nd floor) Home Equipment: Walker - 2 wheels;Walker - 4 wheels;Cane - single point;Shower seat - built  in;Shower seat      Prior Function Level of Independence: Independent with assistive device(s)         Comments: Ambulatory limited household distances with RW vs 4ww depending on how he is feeling.  2-3 L home O2.  Increased time/effort for activities.     Hand Dominance        Extremity/Trunk Assessment   Upper Extremity Assessment Upper Extremity Assessment: Generalized weakness    Lower Extremity Assessment Lower Extremity Assessment: Generalized weakness    Cervical / Trunk Assessment Cervical / Trunk Assessment: Normal  Communication   Communication: No difficulties  Cognition Arousal/Alertness: Awake/alert Behavior During Therapy: WFL for tasks assessed/performed Overall Cognitive Status: Within Functional Limits for tasks assessed                                        General Comments General comments (skin integrity, edema, etc.): Abrasion noted R forehead near ear.  Nursing cleared pt for participation in physical therapy and present during session assisting with functional mobility.  Pt agreeable to PT session.    Exercises     Assessment/Plan    PT Assessment Patient needs continued PT services  PT Problem List Decreased strength;Decreased activity tolerance;Decreased balance;Decreased mobility;Cardiopulmonary status limiting activity;Decreased skin integrity       PT Treatment Interventions DME instruction;Gait training;Stair training;Functional mobility training;Therapeutic activities;Therapeutic exercise;Balance training;Patient/family education    PT Goals (Current goals can be found in the Care Plan section)  Acute Rehab PT Goals Patient Stated Goal: improve strength and mobility PT Goal Formulation: With patient Time For Goal Achievement: 11/07/18 Potential to Achieve Goals: Fair    Frequency Min 2X/week   Barriers to discharge Decreased caregiver support Question level of assist available    Co-evaluation                AM-PAC PT "6 Clicks" Mobility  Outcome Measure Help needed turning from your back to your side while in a flat bed without using bedrails?: A Little Help needed moving from lying on your back to sitting on the side of a flat bed without using bedrails?: A Little Help needed moving to and from a bed to a chair (including a wheelchair)?: A Lot Help needed standing up from a chair using your arms (e.g., wheelchair or bedside chair)?: A Little Help needed to walk in hospital room?: A Lot Help needed climbing 3-5 steps with a railing? : Total 6 Click Score: 14    End of Session Equipment Utilized During Treatment: Gait belt;Oxygen(2 L O2 via nasal cannula) Activity Tolerance: Patient limited by fatigue Patient left: in chair;with call bell/phone within reach;Other (comment)(Nurse reporting no need for chair alarm (pt verbalizing need to call for assist from staff prior to getting up and also demonstrating how to press call light appropriately)) Nurse Communication: Mobility status;Precautions;Other (comment)(Pt's HR and O2 status during session) PT Visit Diagnosis: Other abnormalities of gait and mobility (R26.89);Muscle weakness (generalized) (M62.81);History of falling (Z91.81);Difficulty in walking, not elsewhere classified (R26.2)    Time: 1025-8527 PT Time Calculation (min) (ACUTE ONLY): 16 min   Charges:  PT Evaluation $PT Eval Low Complexity: 1 Low         Hatem Cull, PT 10/24/18, 9:38 AM [phone]

## 2018-10-24 NOTE — Progress Notes (Signed)
Advanced care plan.  Purpose of the Encounter: CODE STATUS  Parties in Attendance: Patient and family  Patient's Decision Capacity: Good  Patient's story Presented for elevated liver function tests, shortness of breath and weakness   Objective/Medical story Has acute hepatic failure along with renal failure thank you Patient also has Coumadin coagulopathy Needs cardiology, gastroenterology and nephrology evaluation  Goals of care determination:  Advance care directives goals of care and treatment plan discussed For now patient says he wants everything done which includes CPR, intubation ventilator if the need arises   CODE STATUS: Full code   Time spent discussing advanced care planning: 16 minutes

## 2018-10-24 NOTE — Progress Notes (Signed)
   [name], MD [address] [place], [place], [place], [address] [address], [place], [place], [place], [address] Phone: [phone]  Fax: [phone]   Subjective: Patient denies any abdominal pain.  Tolerating oral diet.  No nausea or vomiting.  Sitting up in chair comfortably and feels better.   Objective: Exam: Vital signs in last 24 hours: Vitals:   10/23/18 1415 10/23/18 1934 10/24/18 0419 10/24/18 0839  BP: 110/90 113/83 108/71 (!) 123/96  Pulse: (!) 124 64 (!) 110 95  Resp:  16 19   Temp:  (!) 97.2 F (36.2 C) (!) 97.5 F (36.4 C)   TempSrc:  Oral Oral   SpO2:  98% 100%   Weight:   76.2 kg   Height:       Weight change: -0.841 kg  Intake/Output Summary (Last 24 hours) at 10/24/2018 1206 Last data filed at 10/24/2018 0900 Gross per 24 hour  Intake 120 ml  Output 200 ml  Net -80 ml    General: No acute distress, AAO x3 Abd: Soft, NT/ND, No HSM Skin: Warm, no rashes Neck: Supple, Trachea midline   Lab Results: Lab Results  Component Value Date   WBC 8.5 10/24/2018   HGB 9.0 (L) 10/24/2018   HCT 27.4 (L) 10/24/2018   MCV 94.2 10/24/2018   PLT 98 (L) 10/24/2018   Micro Results: Recent Results (from the past 240 hour(s))  MRSA PCR Screening     Status: None   Collection Time: 10/21/18 11:27 AM  Result Value Ref Range Status   MRSA by PCR NEGATIVE NEGATIVE Final    Comment:        The GeneXpert MRSA Assay (FDA approved for NASAL specimens only), is one component of a comprehensive MRSA colonization surveillance program. It is not intended to diagnose MRSA infection nor to guide or monitor treatment for MRSA infections. Performed at [facility], [address] [place], [place] [address]    Studies/Results: No results found. Medications:  Scheduled Meds: . finasteride  5 mg Oral Daily  . guaiFENesin  600 mg Oral BID  . insulin aspart  0-5 Units Subcutaneous QHS  . insulin aspart  0-9 Units Subcutaneous TID WC    . metoprolol succinate  25 mg Oral Daily  . pantoprazole  40 mg Oral Daily  . tiotropium  18 mcg Inhalation Daily   Continuous Infusions: PRN Meds:.bisacodyl, guaiFENesin-dextromethorphan, senna-docusate   Assessment: Elevated liver enzymes   Plan: Hepatitis A IgM positive Therefore, elevated liver enzymes are likely from hepatitis C, with contributions from possible ischemic hepatitis and volume overload from his CHF  Avoid hypotension, continue diuresis as per cardiology  Liver enzymes continue to improve and are expected to continue to improve No abdominal pain or clinical evidence of biliary obstruction Continue daily CMP Avoid hepatotoxic drugs INR is elevated to 4.36 but has improved from 10 or 11 since admission, no active GI bleeding. Continue to monitor INR along with CMP  Patient should exercise good oral and hand and food hygiene to prevent future infections   LOS: 3 days   [name], MD 10/24/2018, 12:06 PM

## 2018-10-24 NOTE — Progress Notes (Signed)
[place] [facility] [place]    MR#:  [id number]  DATE OF BIRTH:  1941/08/09  SUBJECTIVE:  CHIEF COMPLAINT:   Chief Complaint  Patient presents with  . Shortness of Breath  . Weakness  Patient seen and evaluated today Has generalized weakness Comfortable on oxygen via nasal cannula Has decreased congestion in the chest and cough No nausea or vomiting   REVIEW OF SYSTEMS:    ROS  CONSTITUTIONAL: No documented fever. Has fatigue, weakness. No weight gain, no weight loss.  EYES: No blurry or double vision.  ENT: No tinnitus. No postnasal drip. No redness of the oropharynx.  RESPIRATORY: Has cough, no wheeze, no hemoptysis.  Mild dyspnea.  CARDIOVASCULAR: No chest pain. No orthopnea. No palpitations. No syncope.  GASTROINTESTINAL: No nausea, no vomiting or diarrhea. No abdominal pain. No melena or hematochezia.  GENITOURINARY: No dysuria or hematuria.  ENDOCRINE: No polyuria or nocturia. No heat or cold intolerance.  HEMATOLOGY: No anemia. No bruising. No bleeding.  INTEGUMENTARY: No rashes. No lesions.  MUSCULOSKELETAL: No arthritis. No swelling. No gout.  NEUROLOGIC: No numbness, tingling, or ataxia. No seizure-type activity.  PSYCHIATRIC: No anxiety. No insomnia. No ADD.   DRUG ALLERGIES:   Allergies  Allergen Reactions  . Prednisone Other (See Comments)    Reaction: "organs shutting down"    VITALS:  Blood pressure (!) 123/96, pulse 95, temperature (!) 97.5 F (36.4 C), temperature source Oral, resp. rate 19, height 5\' 3"  (1.6 m), weight 76.2 kg, SpO2 100 %.  PHYSICAL EXAMINATION:   Physical Exam  GENERAL:  77 y.o.-year-old patient lying in the bed with no acute distress.  EYES: Pupils equal, round, reactive to light and accommodation. No scleral icterus. Extraocular muscles intact.  HEENT: Head atraumatic, normocephalic. Oropharynx and nasopharynx clear.  NECK:  Supple, no jugular venous distention. No  thyroid enlargement, no tenderness.  LUNGS : Proved breath sounds bilaterally, bibasal basal crepitations heard. No use of accessory muscles of respiration.  CARDIOVASCULAR: S1, S2 irregular. No murmurs, rubs, or gallops.  ABDOMEN: Soft, nontender, nondistended. Bowel sounds present. No organomegaly or mass.  EXTREMITIES: No cyanosis, clubbing  1+ edema b/l.    NEUROLOGIC: Cranial nerves II through XII are intact. No focal Motor or sensory deficits b/l.   PSYCHIATRIC: The patient is alert and oriented x 3.  SKIN: No obvious rash, lesion, or ulcer.   LABORATORY PANEL:   CBC Recent Labs  Lab 10/24/18 0511  WBC 8.5  HGB 9.0*  HCT 27.4*  PLT 98*   ------------------------------------------------------------------------------------------------------------------ Chemistries  Recent Labs  Lab 10/23/18 0527 10/24/18 0511  NA 134* 134*  K 4.7 4.8  CL 94* 94*  CO2 30 32  GLUCOSE 130* 116*  BUN 49* 48*  CREATININE 1.33* 1.10  CALCIUM 7.4* 8.0*  MG 1.8  --   AST 1,461* 645*  ALT 1,934* 1,478*  ALKPHOS 134* 147*  BILITOT 1.9* 2.2*   ------------------------------------------------------------------------------------------------------------------  Cardiac Enzymes Recent Labs  Lab 10/21/18 0755  TROPONINI 0.08*   ------------------------------------------------------------------------------------------------------------------  RADIOLOGY:  No results found.   ASSESSMENT AND PLAN:   77 year old male patient with history of atrial fibrillation, aortic valve regurgitation, Barrett's esophagus, stage III lung cancer, chronic congestive heart failure, COPD, GERD, gout, hyperlipidemia, hypertension currently in ICU  -Acute ischemic hepatitis resolving Liver enzymes trending down Gastroenterology follow-up appreciated Daily LFTs  -Supratherapeutic INR secondary to liver failure improved INR goal is 2-3 Currently INR is 4.3 Pharmacy follow-up for resuming Coumadin  dosing  once INR is below 3  -Acute hyperkalemia resolved  -Right-sided heart failure with congestive hepatopathy with history of aortic valve replacement Medical management  -Acute renal failure on chronic kidney disease stage III Appreciate nephrology evaluation Monitor renal function  -Stage III lung cancer Status post oncology evaluation Further chemotherapy deferred in view of poor cardiopulmonary status  -Atrial fibrillation Status post cardiology evaluation Amiodarone on hold due to hepatocellular failure Maintain INR between 2 and 3 No further intervention recommended  -Pleural effusion Hold off on IV fluids  Mucinex for expectoration PRN Robitussin for cough  -Hepatitis A positive Recommended and counseled good hand and oral hygiene Supportive care  All the records are reviewed and case discussed with Care Management/Social Worker. Management plans discussed with the patient, family and they are in agreement.  CODE STATUS: Full code  DVT Prophylaxis: SCDs  TOTAL TIME TAKING CARE OF THIS PATIENT: 35 minutes.   POSSIBLE D/C IN 2 to 3 DAYS, DEPENDING ON CLINICAL CONDITION.  Saundra Shelling M.D on 10/24/2018 at 12:34 PM  Between 7am to 6pm - Pager - [phone]  After 6pm go to [email or web address] - password [email or web address] [facility]  Office  [phone]  CC: Primary care physician; Guadalupe Maple, MD  Note: This dictation was prepared with Dragon dictation along with smaller phrase technology. Any transcriptional errors that result from this process are unintentional.

## 2018-10-24 NOTE — Care Management Important Message (Signed)
Important Message  Patient Details  Name: Kirk Jones MRN: [id number] Date of Birth: [birth year]   Medicare Important Message Given:  Yes    Juliann Pulse A Byrdie Miyazaki 10/24/2018, 11:02 AM

## 2018-10-24 NOTE — NC FL2 (Signed)
[place] LEVEL OF CARE SCREENING TOOL     IDENTIFICATION  Patient Name: Kirk Jones [id number] Birthdate: 19-Jul-1941 Sex: male Admission Date (Current Location): 10/20/2018  [place] and [place] Number:  Engineering geologist and Address:  [facility], [address], [place], [place] [address]      Provider Number: [id number]  Attending Physician Name and Address:  Saundra Shelling, MD  Relative Name and Phone Number:  Lipa Knauff- spouse [phone]    Current Level of Care: Hospital Recommended Level of Care: [place] Prior Approval Number:    Date Approved/Denied:   PASRR Number: [id number]  Discharge Plan: SNF    Current Diagnoses: Patient Active Problem List   Diagnosis Date Noted  . Acute on chronic congestive heart failure ([place])   . Transaminasemia 10/21/2018  . Hyperkalemia   . Supratherapeutic INR   . Palliative care encounter   . Syncope 10/12/2018  . Pressure injury of skin 10/12/2018  . Squamous cell carcinoma of right lung ([place]) 09/16/2018  . Lung mass 08/22/2018  . Exertional shortness of breath 06/07/2018  . Peripheral edema 06/07/2018  . Allergic rhinitis 02/12/2018  . Advanced care planning/counseling discussion 10/19/2017  . Expressive aphasia 06/13/2017  . Hx of adenomatous colonic polyps 02/18/2017  . Bilateral cataracts 08/18/2016  . CAD (coronary artery disease) 10/06/2015  . Pacemaker 10/06/2015  . Automatic implantable cardioverter-defibrillator in situ 10/06/2015  . Nodular prostate with urinary obstruction 10/06/2015  . Senile purpura ([place]) 10/06/2015  . Barrett's esophagus 05/28/2015  . Hypertension 05/28/2015  . COPD (chronic obstructive pulmonary disease) ([place]) 05/28/2015  . Hyperlipidemia 05/28/2015  . Diverticulosis 05/28/2015  . CKD (chronic kidney disease), stage III ([place]) 05/28/2015  . Gout 05/28/2015  . History of prosthetic heart valve 05/13/2015  . Atrial  fibrillation ([place]) 05/13/2015  . CVA (cerebral vascular accident) ([place]) 07/05/2014  . Systolic congestive heart failure ([place]) 07/05/2014  . History of TIA (transient ischemic attack) 02/12/2013  . Warfarin anticoagulation 02/12/2013    Orientation RESPIRATION BLADDER Height & Weight     Self, Time, Situation, Place  O2(2 liters ) Continent Weight: 167 lb 14.4 oz (76.2 kg) Height:  5\' 3"  (160 cm)  BEHAVIORAL SYMPTOMS/MOOD NEUROLOGICAL BOWEL NUTRITION STATUS  (none) (none) Continent Diet(Renal Diet )  AMBULATORY STATUS COMMUNICATION OF NEEDS Skin   Extensive Assist Verbally Other (Comment)(Stage 2 coccyx)                       Personal Care Assistance Level of Assistance  Bathing, Feeding, Dressing Bathing Assistance: Limited assistance Feeding assistance: Independent Dressing Assistance: Limited assistance     Functional Limitations Info  Sight, Hearing, Speech Sight Info: Adequate Hearing Info: Adequate Speech Info: Adequate    SPECIAL CARE FACTORS FREQUENCY  PT (By licensed PT), OT (By licensed OT)     PT Frequency: 5 OT Frequency: 5            Contractures Contractures Info: Not present    Additional Factors Info  Code Status, Allergies Code Status Info: DNR Allergies Info: Prednisone           Current Medications (10/24/2018):  This is the current hospital active medication list Current Facility-Administered Medications  Medication Dose Route Frequency Provider Last Rate Last Dose  . bisacodyl (DULCOLAX) EC tablet 5 mg  5 mg Oral Daily PRN Arta Silence, MD   5 mg at 10/24/18 0848  . finasteride (PROSCAR) tablet 5 mg  5 mg Oral  Daily Arta Silence, MD   5 mg at 10/24/18 0839  . guaiFENesin (MUCINEX) 12 hr tablet 600 mg  600 mg Oral BID Saundra Shelling, MD   600 mg at 10/24/18 0839  . guaiFENesin-dextromethorphan (ROBITUSSIN DM) 100-10 MG/5ML syrup 5 mL  5 mL Oral Q4H PRN Pyreddy, Pavan, MD      . insulin aspart (novoLOG) injection 0-5  Units  0-5 Units Subcutaneous QHS Sridharan, Prasanna, MD      . insulin aspart (novoLOG) injection 0-9 Units  0-9 Units Subcutaneous TID WC Arta Silence, MD   1 Units at 10/23/18 1802  . metoprolol succinate (TOPROL-XL) 24 hr tablet 25 mg  25 mg Oral Daily Pyreddy, Reatha Harps, MD   25 mg at 10/24/18 0839  . pantoprazole (PROTONIX) EC tablet 40 mg  40 mg Oral Daily Arta Silence, MD   40 mg at 10/24/18 0840  . senna-docusate (Senokot-S) tablet 1 tablet  1 tablet Oral QHS PRN Arta Silence, MD      . tiotropium (SPIRIVA) inhalation capsule ([facility] use ONLY) 18 mcg  18 mcg Inhalation Daily Saundra Shelling, MD   18 mcg at 10/24/18 5208   Facility-Administered Medications Ordered in Other Encounters  Medication Dose Route Frequency Provider Last Rate Last Dose  . 0.9 %  sodium chloride infusion   Intravenous Continuous Ascencion Dike, PA-C         Discharge Medications: Please see discharge summary for a list of discharge medications.  Relevant Imaging Results:  Relevant Lab Results:   Additional Information SSN: [id number]  Patient also recieving Radiation therapy at [facility] daily   Annamaria Boots, [facility]

## 2018-10-24 NOTE — Progress Notes (Signed)
ANTICOAGULATION CONSULT NOTE - Initial Consult  Pharmacy Consult for Warfarin Indication: atrial fibrillation  Allergies  Allergen Reactions  . Prednisone Other (See Comments)    Reaction: "organs shutting down"    Patient Measurements: Height: 5\' 3"  (160 cm) Weight: 167 lb 14.4 oz (76.2 kg) IBW/kg (Calculated) : 56.9  Vital Signs: Temp: 97.5 F (36.4 C) (11/26 0419) Temp Source: Oral (11/26 0419) BP: 108/71 (11/26 0419) Pulse Rate: 110 (11/26 0419)  Labs: Recent Labs    10/21/18 0755  10/22/18 1424  10/22/18 2309 10/23/18 0527 10/23/18 0849 10/23/18 1719 10/23/18 2259 10/24/18 0511  HGB  --    < >  --    < > 8.8* 8.8*  --   --   --  9.0*  HCT  --    < >  --    < > 26.8* 26.2*  --   --   --  27.4*  PLT  --    < >  --    < > 111* 111*  --   --   --  98*  APTT  --    < >  --    < > 34  --  35 33 32 33  LABPROT  --    < >  --    < > 39.8*  --  38.0*  --   --  41.0*  INR  --    < >  --    < > 4.19*  --  3.95  --   --  4.36*  CREATININE  --    < > 1.46*  --   --  1.33*  --   --   --  1.10  TROPONINI 0.08*  --   --   --   --   --   --   --   --   --    < > = values in this interval not displayed.    Estimated Creatinine Clearance: 51.4 mL/min (by C-G formula based on SCr of 1.1 mg/dL).   Medical History: Past Medical History:  Diagnosis Date  . AICD (automatic cardioverter/defibrillator) present   . Aortic valve regurgitation   . Atrial fibrillation ([place])   . Barrett's esophagus   . Cancer ([place])    sate III lung  . CHF (congestive heart failure) ([place])   . COPD (chronic obstructive pulmonary disease) ([place])   . Diverticulosis   . Dysrhythmia   . GERD (gastroesophageal reflux disease)   . Gout   . Headache    aura only  . Heart murmur   . Hematuria   . Hernia of abdominal cavity   . History of prosthetic heart valve   . Hyperlipidemia   . Hypertension   . Nodular prostate with urinary obstruction   . Presence of permanent cardiac pacemaker   . Renal  insufficiency   . Substance abuse ([facility])    alcohol    Medications:  Scheduled:  . finasteride  5 mg Oral Daily  . guaiFENesin  600 mg Oral BID  . insulin aspart  0-5 Units Subcutaneous QHS  . insulin aspart  0-9 Units Subcutaneous TID WC  . metoprolol succinate  25 mg Oral Daily  . pantoprazole  40 mg Oral Daily  . tiotropium  18 mcg Inhalation Daily   Infusions:   PRN: bisacodyl, guaiFENesin-dextromethorphan, senna-docusate  Assessment: 77 year old male with hx of Afib, presented with SOB and weakness. Transaminasemia holding amiodarone. INR elevated but trending down.Home  regimen is warfarin 3 mg daily.   Date INR Warfarin Dose  11/23 7.26 None- vitamin K 5 mg x1   11/24 5.13 None  11/24 4.67 None  11/24 4.19 None  11/25 3.95 Hold  11/26 4.36 Hold     Goal of Therapy:  INR 2-3 Monitor platelets by anticoagulation protocol: Yes   Plan:  Will hold warfarin until INR is < 3. Monitor LFTs - trending down. Daily INR and CBC will be ordered.   Oswald Hillock, PharmD  Clinical Pharmacist  10/24/2018,7:28 AM

## 2018-10-24 NOTE — Evaluation (Signed)
Occupational Therapy Evaluation Patient Details Name: Kirk Jones MRN: [id number] DOB: January 26, 1941 Today's Date: 10/24/2018    History of Present Illness Pt is a 77 y.o. male presenting to hospital 10/20/18 with SOB and weakness; recent fall with L hand skin tear.  Pt admitted with acute ischemic hepatitis, supratherapeutic INR, acute hyperkalemia, R sided heart failure with congestive hepatopathy, and a-fib.  PMH includes stage 3 lung CA on 2-3 L home O2, CHF, COPD, a-fib on warfarin, Barrett's esophagus, AICD, aortic aneurysm, expressive aphasia, CVA, AVR, CTR, h/o R femur fx surgery.   Clinical Impression   Pt seen for OT evaluation this date. Pt presented in bed after just moving from recliner. Pt feeling dizzy, lightheaded and sleepy. Attempted to take HR/O2 levels however, pt's fingers too cold to get a reading despite trying to warm fingers with warm washcloth and finger massage. Prior to admission, pt was receiving assistance from spouse for UB/LB dressing in seated position. Pt slept in recliner for past 4 weeks since chemo/radiation treatments.  Currently pt demonstrates impairments in (see OT Problem List below)  requiring at least MIN assist for UB/LB dressing and at least MIN A LB bathing. Pt/family instructed in energy conservation strategies to use during ADL tasks and falls prevention strategies. Pt/family verbalized understanding of all education presented at this time.   Pt would benefit from skilled OT to address noted impairments and functional limitations  in order to maximize safety and independence while minimizing falls risk and caregiver burden.  Upon hospital discharge, recommend pt discharge to SNF.    Follow Up Recommendations  SNF    Equipment Recommendations  None recommended by OT    Recommendations for Other Services       Precautions / Restrictions Precautions Precautions: Fall Precaution Comments: R chest port Restrictions Weight Bearing  Restrictions: No      Mobility Bed Mobility                Transfers                 General transfer comment: deferred; pt remained in bed 2/2 dizziness and lightheadedness     Balance       Sitting balance - Comments: Deferred                                   ADL either performed or assessed with clinical judgement   ADL Overall ADL's : Needs assistance/impaired                                       General ADL Comments: Pt remained in bed for session 2/2 feeling dizzy and lightheaded. Had just gotten back to bed after sitting in recliner. MIN A at baseline for UB/LB dressing, would required at least more than MINA in seated position.  Would require more than MIN A for LB bathing while in seated position.     Vision Baseline Vision/History: Wears glasses Wears Glasses: At all times Patient Visual Report: No change from baseline Additional Comments: Pt stated since chemo/radiation his vision is blurry and sees double at baseline with L eye visual field larger but not as clear as R eye.      Perception     Praxis      Pertinent Vitals/Pain Pain Assessment: 0-10 Pain Score: 2  Pain  Location: R buttock Pain Descriptors / Indicators: Dull Pain Intervention(s): Monitored during session     Hand Dominance     Extremity/Trunk Assessment Upper Extremity Assessment Upper Extremity Assessment: Generalized weakness(RUE full ROM flexion/extension in shoulder; LUE 50% ROM flexion/extension. Grip strength 4/5.)   Lower Extremity Assessment Lower Extremity Assessment: Generalized weakness(Pt remained in bed for session; will assess next session)       Communication Communication Communication: No difficulties   Cognition Arousal/Alertness: Awake/alert Behavior During Therapy: WFL for tasks assessed/performed Overall Cognitive Status: Within Functional Limits for tasks assessed                                      General Comments  Abrasion noted R forehead near ear d/t fall; fingers very cold, unable to get O2 levels/HR even after massaging fingers and applying warm washcloth.     Exercises Other Exercises Other Exercises: Pt/family educated in energy conservation strategies. Other Exercises: Pt/family educated in bed level exercises for UE.   Shoulder Instructions      Home Living Family/patient expects to be discharged to:: Private residence Living Arrangements: Spouse/significant other Available Help at Discharge: Family;Available 24 hours/day Type of Home: House Home Access: Stairs to enter [employer] of Steps: 4 Entrance Stairs-Rails: Right;Left;Can reach both Home Layout: Two level;Able to live on main level with bedroom/bathroom     Bathroom Shower/Tub: Walk-in shower;Tub/shower unit   Bathroom Toilet: Handicapped height     Home Equipment: Environmental consultant - 2 wheels;Walker - 4 wheels;Cane - single point;Shower seat - built in;Shower seat          Prior Functioning/Environment Level of Independence: Independent with assistive device(s)        Comments: Ambulatory limited household distances with RW or 4ww; uses 2-3 L home O2; gets assistance with UB/LB dressing (socks, buttoning) independnet with eating, grooming and showering. Pt sleeps in recliner for past 4 weeks 2/2 chemo/radiation.  Increased time/effort for ADLs.         OT Problem List: Decreased strength;Decreased activity tolerance;Cardiopulmonary status limiting activity;Decreased knowledge of use of DME or AE;Impaired UE functional use;Pain;Impaired balance (sitting and/or standing);Decreased range of motion      OT Treatment/Interventions: Self-care/ADL training;Energy conservation;Therapeutic exercise;Therapeutic activities;Patient/family education;DME and/or AE instruction;Balance training    OT Goals(Current goals can be found in the care plan section) Acute Rehab OT Goals Patient Stated Goal: to not  get so SOB OT Goal Formulation: With patient/family Time For Goal Achievement: 11/07/18 Potential to Achieve Goals: Good ADL Goals Pt Will Perform Lower Body Dressing: with min assist;with adaptive equipment;sit to/from stand Additional ADL Goal #1: Pt will independently utilize at least 2 energy conservation strategies when participating in ADL task. Additional ADL Goal #2: Pt will independently utilize at least 2  falls prevention strategies when completing ADL tasks.  OT Frequency: Min 1X/week   Barriers to D/C:            Co-evaluation              AM-PAC OT "6 Clicks" Daily Activity     Outcome Measure Help from another person eating meals?: None Help from another person taking care of personal grooming?: A Little Help from another person toileting, which includes using toliet, bedpan, or urinal?: A Lot Help from another person bathing (including washing, rinsing, drying)?: A Lot Help from another person to put on and taking off regular upper body clothing?: A  Little Help from another person to put on and taking off regular lower body clothing?: A Lot 6 Click Score: 16   End of Session    Activity Tolerance: Patient tolerated treatment well(Pt fell asleep 3/4 way through session) Patient left: in bed;with call bell/phone within reach;with bed alarm set;with family/visitor present  OT Visit Diagnosis: Other abnormalities of gait and mobility (R26.89);Pain;Repeated falls (R29.6) Pain - Right/Left: Right Pain - part of body: (buttocks)                Time: 5848-3507 OT Time Calculation (min): 42 min Charges:     Jadene Pierini OTS  10/24/2018, 4:47 PM

## 2018-10-24 NOTE — Clinical Social Work Note (Signed)
Clinical Social Work Assessment  Patient Details  Name: Kirk Jones MRN: [id number] Date of Birth: 03/07/1941  Date of referral:  10/24/18               Reason for consult:  Facility Placement                Permission sought to share information with:  Case Manager, Customer service manager, Family Supports Permission granted to share information::  Yes, Verbal Permission Granted  Name::      SNF  Agency::   [facility]   Relationship::     Contact Information:     Housing/Transportation Living arrangements for the past 2 months:  [place] of Information:  Patient Patient Interpreter Needed:  None Criminal Activity/Legal Involvement Pertinent to Current Situation/Hospitalization:  No - Comment as needed Significant Relationships:  Spouse, Adult Children Lives with:  Spouse Do you feel safe going back to the place where you live?  Yes Need for family participation in patient care:  Yes (Comment)  Care giving concerns:  Patient lives with his wife in [place] Worker assessment / plan:  CSW consulted for SNF placement. CSW met with patient and wife Vannie Hochstetler at bedside. CSW introduced self and explained role. Patient is currently receiving radiation at [facility] daily. Patient reports that he lives with wife and would prefer to return home but is open to rehab as well. CSW explained PT recommendation of SNF. Patient states that he would prefer [facility] if he decides to go to SNF. Patient and wife would like to discuss and think about options before making a final decision. Patient did give permission for SNF bed search. CSW initiated bed search and will give bed offers once received.   Employment status:  Retired Forensic scientist:  [employer] PT Recommendations:  [place] / Referral to community resources:  [place]  Patient/Family's Response to care:  Patient and wife thanked CSW  for assistance   Patient/Family's Understanding of and Emotional Response to Diagnosis, Current Treatment, and Prognosis:  Patient understands diagnoses and treatment   Emotional Assessment Appearance:  Appears stated age Attitude/Demeanor/Rapport:    Affect (typically observed):  Accepting, Pleasant Orientation:  Oriented to Self, Oriented to Place, Oriented to  Time, Oriented to Situation Alcohol / Substance use:  Not Applicable Psych involvement (Current and /or in the community):  No (Comment)  Discharge Needs  Concerns to be addressed:  Discharge Planning Concerns Readmission within the last 30 days:  Yes Current discharge risk:  None Barriers to Discharge:  Continued Medical Work up   [employer], [place] 10/24/2018, 1:58 PM

## 2018-10-25 ENCOUNTER — Ambulatory Visit: Payer: Medicare Other

## 2018-10-25 ENCOUNTER — Telehealth: Payer: Self-pay | Admitting: Family Medicine

## 2018-10-25 DIAGNOSIS — L89150 Pressure ulcer of sacral region, unstageable: Secondary | ICD-10-CM

## 2018-10-25 DIAGNOSIS — R531 Weakness: Secondary | ICD-10-CM

## 2018-10-25 DIAGNOSIS — B159 Hepatitis A without hepatic coma: Secondary | ICD-10-CM

## 2018-10-25 LAB — GLUCOSE, CAPILLARY
Glucose-Capillary: 86 mg/dL (ref 70–99)
Glucose-Capillary: 108 mg/dL — ABNORMAL HIGH (ref 70–99)
Glucose-Capillary: 94 mg/dL (ref 70–99)
Glucose-Capillary: 114 mg/dL — ABNORMAL HIGH (ref 70–99)

## 2018-10-25 LAB — COMPREHENSIVE METABOLIC PANEL
ALK PHOS: 135 U/L — AB (ref 38–126)
ALT: 1017 U/L — AB (ref 0–44)
AST: 289 U/L — AB (ref 15–41)
Albumin: 2.7 g/dL — ABNORMAL LOW (ref 3.5–5.0)
Anion gap: 9 (ref 5–15)
BUN: 44 mg/dL — AB (ref 8–23)
CALCIUM: 8.3 mg/dL — AB (ref 8.9–10.3)
CO2: 31 mmol/L (ref 22–32)
CREATININE: 1 mg/dL (ref 0.61–1.24)
Chloride: 93 mmol/L — ABNORMAL LOW (ref 98–111)
GFR calc non Af Amer: >60 mL/min (ref >60)
GLUCOSE: 113 mg/dL — AB (ref 70–99)
Potassium: 4.6 mmol/L (ref 3.5–5.1)
SODIUM: 133 mmol/L — AB (ref 135–145)
Total Bilirubin: 2.6 mg/dL — ABNORMAL HIGH (ref 0.3–1.2)
Total Protein: 5.2 g/dL — ABNORMAL LOW (ref 6.5–8.1)

## 2018-10-25 LAB — PROTIME-INR
INR: 4.14
Prothrombin Time: 38.9 seconds — ABNORMAL HIGH (ref 11.4–15.2)

## 2018-10-25 MED ORDER — METOPROLOL SUCCINATE ER 25 MG PO TB24
37.5000 mg | ORAL_TABLET | Freq: Every day | ORAL | Status: DC
  Administered 2018-10-25: 10:00:00 37.5 mg via ORAL
  Filled 2018-10-25: qty 2

## 2018-10-25 NOTE — Progress Notes (Signed)
Patient Name: Kirk Jones [facility] Date of Encounter: 10/25/2018  Hospital Problem List     Active Problems:   Transaminasemia   Hyperkalemia   Supratherapeutic INR   Acute on chronic congestive heart failure [facility])    Patient Profile     77 year old male with history of cardiomyopathy with an ejection fraction approximately 20% with an AICD in place history of ventricular tachycardia, status post multiple PCI's who was readmitted after a recent admission for syncope.  He now is being treated for elevated transaminases as well as renal insufficiency, prolonged INR due to warfarin use.  He is gradually improving today with slightly less shortness of breath and less peripheral edema  Subjective   Patient still somewhat weak but able to transfer from bed to chair with assistance.  Somewhat less short of breath.  Somewhat improved transaminases and renal function.  INR is still prolonged but improved.  Inpatient Medications    . finasteride  5 mg Oral Daily  . guaiFENesin  600 mg Oral BID  . insulin aspart  0-5 Units Subcutaneous QHS  . insulin aspart  0-9 Units Subcutaneous TID WC  . metoprolol succinate  25 mg Oral Daily  . pantoprazole  40 mg Oral Daily  . tiotropium  18 mcg Inhalation Daily    Vital Signs    Vitals:   10/24/18 0839 10/24/18 2114 10/25/18 0500 10/25/18 0548  BP: (!) 123/96 98/64  106/71  Pulse: 95 98  86  Resp:  20  20  Temp:  98.2 F (36.8 C)  98.9 F (37.2 C)  TempSrc:      SpO2:  100%  97%  Weight:   76.2 kg   Height:        Intake/Output Summary (Last 24 hours) at 10/25/2018 0702 Last data filed at 10/25/2018 0552 Gross per 24 hour  Intake 120 ml  Output 375 ml  Net -255 ml   Filed Weights   10/23/18 0500 10/24/18 0419 10/25/18 0500  Weight: 76.3 kg 76.2 kg 76.2 kg    Physical Exam    GEN: Well nourished, well developed, in no acute distress.  HEENT: normal.  Neck: Supple, no JVD, carotid bruits, or masses. Cardiac: Irregular  irregular, left upper extremity edema.  Bilateral lower extremity edema. Respiratory:  Respirations regular and unlabored, clear to auscultation bilaterally. GI: Soft, nontender, nondistended, BS + x 4.  MS: no deformity or atrophy. Skin: warm and dry, skin tears bilaterally. Neuro:  Strength and sensation are intact. Psych: Normal affect.  Labs    CBC Recent Labs    10/22/18 2309 10/23/18 0527 10/24/18 0511  WBC 11.2* 10.5 8.5  NEUTROABS 11.0* 10.2*  --   HGB 8.8* 8.8* 9.0*  HCT 26.8* 26.2* 27.4*  MCV 94.0 92.9 94.2  PLT 111* 111* 98*   Basic Metabolic Panel Recent Labs    10/22/18 2309 10/23/18 0527 10/24/18 0511 10/25/18 0553  NA  --  134* 134* 133*  K  --  4.7 4.8 4.6  CL  --  94* 94* 93*  CO2  --  30 32 31  GLUCOSE  --  130* 116* 113*  BUN  --  49* 48* 44*  CREATININE  --  1.33* 1.10 1.00  CALCIUM  --  7.4* 8.0* 8.3*  MG 1.8 1.8  --   --   PHOS 4.5 4.4  --   --    Liver Function Tests Recent Labs    10/24/18 0511 10/25/18 0553  AST 645*  289*  ALT 1,478* 1,017*  ALKPHOS 147* 135*  BILITOT 2.2* 2.6*  PROT 5.4* 5.2*  ALBUMIN 2.8* 2.7*   No results for input(s): LIPASE, AMYLASE in the last 72 hours. Cardiac Enzymes No results for input(s): CKTOTAL, CKMB, CKMBINDEX, TROPONINI in the last 72 hours. BNP No results for input(s): BNP in the last 72 hours. D-Dimer No results for input(s): DDIMER in the last 72 hours. Hemoglobin A1C No results for input(s): HGBA1C in the last 72 hours. Fasting Lipid Panel No results for input(s): CHOL, HDL, LDLCALC, TRIG, CHOLHDL, LDLDIRECT in the last 72 hours. Thyroid Function Tests No results for input(s): TSH, T4TOTAL, T3FREE, THYROIDAB in the last 72 hours.  Invalid input(s): FREET3  Telemetry    Atrial fibrillation with variable but fairly rapid ventricular response  ECG    A. fib with ventricular paced rhythm  Radiology    Dg Chest 1 View  Result Date: 10/21/2018 CLINICAL DATA:  77 y/o  M; aspiration  into the respiratory tract. EXAM: CHEST  1 VIEW COMPARISON:  10/21/2018 chest CT and chest radiograph. FINDINGS: Stable right basilar opacity and right-sided pleural effusion. Interstitial pulmonary edema. Right port catheter tip projects over mid SVC. Stable cardiac silhouette given projection and technique. Single lead AICD. Post median sternotomy with wires in alignment. Calcific aortic atherosclerosis. No acute osseous abnormality is evident. IMPRESSION: Stable right basilar opacity corresponding to mass on CT and right-sided pleural effusion. Interstitial pulmonary edema. Aortic Atherosclerosis (ICD10-I70.0). Electronically Signed   By: Kristine Garbe M.D.   On: 10/21/2018 20:23   Ct Head Wo Contrast  Result Date: 10/12/2018 CLINICAL DATA:  Acute onset of dizziness and fall. Hit head on headboard. Laceration at the right temple. Patient on Coumadin. Initial encounter. EXAM: CT HEAD WITHOUT CONTRAST TECHNIQUE: Contiguous axial images were obtained from the base of the skull through the vertex without intravenous contrast. COMPARISON:  CT of the head performed 09/04/2018 FINDINGS: Brain: No evidence of acute infarction, hemorrhage, hydrocephalus, extra-axial collection or mass lesion / mass effect. Prominence of the ventricles and sulci reflects mild to moderate cortical volume loss. Cerebellar atrophy is noted. Scattered periventricular and subcortical white matter change likely reflects small vessel ischemic microangiopathy. A small chronic lacunar infarct is noted at the left cerebellar hemisphere. The brainstem and fourth ventricle are within normal limits. The basal ganglia are unremarkable in appearance. The cerebral hemispheres demonstrate grossly normal gray-white differentiation. No mass effect or midline shift is seen. Vascular: No hyperdense vessel or unexpected calcification. Skull: There is no evidence of fracture; visualized osseous structures are unremarkable in appearance.  Sinuses/Orbits: The visualized portions of the orbits are within normal limits. The paranasal sinuses and mastoid air cells are well-aerated. Other: A prominent soft tissue laceration is noted anterior to the right ear, with soft tissue air extending below the right ear. IMPRESSION: 1. No evidence of traumatic intracranial injury or fracture. 2. Prominent soft tissue laceration anterior to the right ear, with soft tissue air extending below the right ear. 3. Mild to moderate cortical volume loss and scattered small vessel ischemic microangiopathy. 4. Small chronic lacunar infarct at the left cerebellar hemisphere. Electronically Signed   By: Garald Balding M.D.   On: 10/12/2018 00:18   Ct Chest Wo Contrast  Result Date: 10/21/2018 CLINICAL DATA:  Shortness of breath and weakness. EXAM: CT CHEST WITHOUT CONTRAST TECHNIQUE: Multidetector CT imaging of the chest was performed following the standard protocol without IV contrast. COMPARISON:  08/21/2018 FINDINGS: Cardiovascular: Evaluation of vascular structures is limited  without IV contrast material. Cardiac pacemaker. Diffuse cardiac enlargement. No pericardial effusions. Coronary artery calcifications. Postoperative changes in the mediastinum. Calcification throughout the aorta. Mediastinum/Nodes: Moderate prominent lymph nodes throughout the mediastinum without change since prior study, likely reactive. Lungs/Pleura: Bilateral pleural effusions, greater on the right. Atelectasis in the lung bases. Known right lower lobe mass is visible but somewhat obscured by the effusion and atelectasis. Emphysematous changes in the lungs. No pneumothorax. Airways are patent. Upper Abdomen: Probable hepatic cirrhosis. Free fluid in the right upper quadrant likely represents ascites. Musculoskeletal: Degenerative changes in the spine. No destructive bone lesions. Postoperative sternotomy wires. IMPRESSION: Known mass in the right lung base. Cardiac enlargement. Bilateral  pleural effusions with basilar atelectasis, greater on the right. Mild progression on the right since previous study. Hepatic cirrhosis with upper abdominal ascites. Electronically Signed   By: Lucienne Capers M.D.   On: 10/21/2018 06:53   Dg Hand 2 View Left  Result Date: 10/12/2018 CLINICAL DATA:  Pt reports he had a fall last night and is now experiencing pain in his left hand. Pt hand appears swollen and red at this time EXAM: LEFT HAND - 2 VIEW COMPARISON:  None. FINDINGS: IV tubing overlies the wrist. There are degenerative changes insert wrist, primarily along the radial aspect of the carpals and the first carpometacarpal joint. There is degenerative change at the second metacarpophalangeal joint. No acute fracture or subluxation. Atherosclerotic calcification of the small arteries. IMPRESSION: No evidence for acute  abnormality.  Degenerative changes. Electronically Signed   By: Nolon Nations M.D.   On: 10/12/2018 15:27   US Venous Img Upper Uni Left  Result Date: 10/15/2018 CLINICAL DATA:  Left arm swelling. Recent fall. Left cardiac ICD. Edema from the elbow to the wrist. EXAM: LEFT UPPER EXTREMITY VENOUS DOPPLER ULTRASOUND TECHNIQUE: Gray-scale sonography with graded compression, as well as color Doppler and duplex ultrasound were performed to evaluate the upper extremity deep venous system from the level of the subclavian vein and including the jugular, axillary, basilic, radial, ulnar and upper cephalic vein. Spectral Doppler was utilized to evaluate flow at rest and with distal augmentation maneuvers. COMPARISON:  Chest radiograph 10/11/2018 FINDINGS: Contralateral Subclavian Vein: Normal respiratory phasicity. Normal color Doppler flow in the right subclavian vein. No evidence for right subclavian thrombus. Internal Jugular Vein: No evidence of thrombus. Normal compressibility, respiratory phasicity and response to augmentation. Subclavian Vein: The left cardiac ICD lead is noted. There  appears to be flow around the lead and left subclavian vein appears to be patent without thrombus. Axillary Vein: No evidence of thrombus. Normal compressibility, respiratory phasicity and response to augmentation. Cephalic Vein: No evidence of thrombus. Normal compressibility, respiratory phasicity and response to augmentation. Basilic Vein: No evidence of thrombus. Normal compressibility, respiratory phasicity and response to augmentation. Brachial Veins: No evidence of thrombus. Normal compressibility, respiratory phasicity and response to augmentation. Radial Veins: No evidence of thrombus. Normal compressibility, respiratory phasicity and response to augmentation. Single radial vein is identified. Ulnar Veins: No evidence of thrombus. Normal compressibility, respiratory phasicity and response to augmentation. Single ulnar vein is identified. Venous Reflux:  None visualized. Other Findings:  Subcutaneous edema. IMPRESSION: No evidence of DVT within the left upper extremity. Limited evaluation of the left subclavian vein due to the cardiac ICD but no evidence for thrombus in this area. Electronically Signed   By: Markus Daft M.D.   On: 10/15/2018 14:58   Ct Abdomen W Wo Contrast  Result Date: 09/25/2018 CLINICAL DATA:  Evaluate pancreatic  calcifications EXAM: CT ABDOMEN WITHOUT AND WITH CONTRAST TECHNIQUE: Multidetector CT imaging of the abdomen was performed following the standard protocol before and following the bolus administration of intravenous contrast. CONTRAST:  172mL ISOVUE-300 IOPAMIDOL (ISOVUE-300) INJECTION 61% COMPARISON:  PET-CT 08/30/2018. FINDINGS: Lower chest: Large right lower lobe lung mass is again identified measuring 7.9 cm. Loculated pleural fluid is identified overlying the right midlung and right lower lobe. Small left pleural effusion noted. Aortic atherosclerosis is identified. Hepatobiliary: No focal liver abnormality. The gallbladder is not confidently identified and may be  surgically absent. Pancreas: No pancreatic inflammation identified faint punctate areas of mild hyperattenuation within the head of pancreas are again identified within head of pancreas, image 39/2 and may represent small calcifications. 1.4 cm low-density structure within the head of pancreas is identified measuring 32 HU, image 96/14. This is favored to represent a mildly dilated common bile duct. This roughly corresponds to the areas of calcification within the head of pancreas which may reflect choledocholithiasis. Body and tail of pancreas appear normal. Spleen: Spleen normal. Adrenals/Urinary Tract: Unremarkable appearance of the adrenal glands. There is a cyst arising from inferior pole of right kidney measuring 3 cm, image 46/11. Smaller cysts noted within the upper pole of the right kidney. Multiple areas of cortical scarring involve the left kidney. Stomach/Bowel: Stomach and small bowel loops have a normal course and caliber. Extensive colonic diverticulosis noted. Vascular/Lymphatic: Aortic atherosclerosis. No aneurysm. No adenopathy identified within the abdomen. Other: Trace free fluid identified along the pericolic gutters. Musculoskeletal: Mild thoraco lumbar scoliosis and multilevel degenerative disc disease. IMPRESSION: 1. Faint calcifications within head of pancreas are again noted. There is cystic area of low attenuation within the head of pancreas which may represent a mildly dilated common bile duct. Calcifications may represent choledocholithiasis. In a patient who cannot have an MRI due to pacer device ERCP may be helpful to confirm common bile duct stones. 2. Large right lower lobe lung mass with surrounding loculated pleural fluid 3.  Aortic Atherosclerosis (ICD10-I70.0). Electronically Signed   By: Kerby Moors M.D.   On: 09/25/2018 16:27   Dg Chest Port 1 View  Result Date: 10/21/2018 CLINICAL DATA:  Shortness of breath. EXAM: PORTABLE CHEST 1 VIEW COMPARISON:  Chest x-rays dated  10/11/2018 and 08/10/2018. FINDINGS: Increased opacity at the RIGHT lung base. Probable mild atelectasis and/or small pleural effusion at the LEFT lung base. No pneumothorax seen. No acute or suspicious osseous finding. Stable cardiomegaly. RIGHT chest wall Port-A-Cath appears stable in position with tip at the level of the mid/lower SVC. LEFT chest wall pacemaker/ICD apparatus is stable in position. Median sternotomy wires appear intact and stable in alignment. IMPRESSION: 1. Increasing opacity at the RIGHT lung base. This is mostly related to the RIGHT lower lobe mass identified on recent chest CT (08/21/2018) and PET-CT (08/30/2018), likely with superimposed pleural effusion and/or atelectasis that has slightly increased in the interval. 2. Stable cardiomegaly. 3. Probable mild atelectasis and/or small pleural effusion at the LEFT lung base. Electronically Signed   By: Franki Cabot M.D.   On: 10/21/2018 00:32   Dg Chest Port 1 View  Result Date: 10/12/2018 CLINICAL DATA:  Status post fall, with concern for chest injury. Initial encounter. EXAM: PORTABLE CHEST 1 VIEW COMPARISON:  CT of the chest performed 08/21/2018 FINDINGS: There is a persistent small right-sided pleural effusion. The known right lower lobe mass is better characterized on prior CT. There is suggestion of underlying interstitial edema, which may be transient in nature. No  pneumothorax is seen. The cardiomediastinal silhouette is borderline normal in size. The patient is status post median sternotomy. An AICD is noted overlying the left chest wall, with a single lead ending overlying the right ventricle. The patient's right chest port is noted ending about the mid SVC. No displaced rib fractures are seen. IMPRESSION: 1. Persistent small right-sided pleural effusion. Known right lower lobe mass is better characterized on prior CT. Suggestion of underlying interstitial edema, which may be transient in nature. 2. No displaced rib fracture seen.  Electronically Signed   By: Garald Balding M.D.   On: 10/12/2018 00:22   US Liver Doppler  Result Date: 10/21/2018 CLINICAL DATA:  Abnormal transaminases. Patient undergoing chemotherapy and radiation for stage III lung cancer. EXAM: DUPLEX ULTRASOUND OF LIVER TECHNIQUE: Color and duplex Doppler ultrasound was performed to evaluate the hepatic in-flow and out-flow vessels. COMPARISON:  CT abdomen 09/25/2018 FINDINGS: Gallbladder: The gallbladder is not definitively identified. In the gallbladder fossa, there is a structure which is could represent a severely contracted gallbladder. No significant gallbladder lumen is visualized. No stones are identified. Murphy's sign is negative. Common bile duct: Diameter: 4.1 mm, normal Liver: normal parenchymal echogenicity. Normal hepatic contour without nodularity. No focal lesion, mass or intrahepatic biliary ductal dilatation. Portal Vein Velocities Main:  29 cm/sec Right:  39 cm/sec Left:  116 cm/sec Main portal vein demonstrates bidirectional flow with intermittent hepatopetal and hepatofugal flow direction. Right and left portal veins demonstrate normal hepatopetal flow direction. Hepatic Vein Velocities Right:  32 cm/sec Middle:  56 cm/sec Left:  30 cm/sec Normal flow direction demonstrated in the hepatic veins. IVC: Present and patent with normal respiratory phasicity. Velocity measures 36 centimeters/second. Hepatic Artery Velocity:  130/32 cm/sec Splenic Vein Velocity:  48 cm/sec Varices: Not identified. Ascites: Minimal fluid over the liver edge. Incidental note of small right pleural effusion. IMPRESSION: 1. Gallbladder is markedly contracted, likely to be physiologic but nonspecific. No stones identified. 2. Normal flow velocities demonstrated in the portal and hepatic veins. Bidirectional flow is demonstrated in the main portal vein. Bidirectional flow may be due to Valsalva or early portal hypertension. 3. Right pleural effusion and small amount of ascites.  Electronically Signed   By: Lucienne Capers M.D.   On: 10/21/2018 05:26   US Abdomen Limited Ruq  Result Date: 10/21/2018 CLINICAL DATA:  Abnormal transaminases. Patient undergoing chemotherapy and radiation for stage III lung cancer. EXAM: DUPLEX ULTRASOUND OF LIVER TECHNIQUE: Color and duplex Doppler ultrasound was performed to evaluate the hepatic in-flow and out-flow vessels. COMPARISON:  CT abdomen 09/25/2018 FINDINGS: Gallbladder: The gallbladder is not definitively identified. In the gallbladder fossa, there is a structure which is could represent a severely contracted gallbladder. No significant gallbladder lumen is visualized. No stones are identified. Murphy's sign is negative. Common bile duct: Diameter: 4.1 mm, normal Liver: normal parenchymal echogenicity. Normal hepatic contour without nodularity. No focal lesion, mass or intrahepatic biliary ductal dilatation. Portal Vein Velocities Main:  29 cm/sec Right:  39 cm/sec Left:  116 cm/sec Main portal vein demonstrates bidirectional flow with intermittent hepatopetal and hepatofugal flow direction. Right and left portal veins demonstrate normal hepatopetal flow direction. Hepatic Vein Velocities Right:  32 cm/sec Middle:  56 cm/sec Left:  30 cm/sec Normal flow direction demonstrated in the hepatic veins. IVC: Present and patent with normal respiratory phasicity. Velocity measures 36 centimeters/second. Hepatic Artery Velocity:  130/32 cm/sec Splenic Vein Velocity:  48 cm/sec Varices: Not identified. Ascites: Minimal fluid over the liver edge. Incidental note  of small right pleural effusion. IMPRESSION: 1. Gallbladder is markedly contracted, likely to be physiologic but nonspecific. No stones identified. 2. Normal flow velocities demonstrated in the portal and hepatic veins. Bidirectional flow is demonstrated in the main portal vein. Bidirectional flow may be due to Valsalva or early portal hypertension. 3. Right pleural effusion and small amount of  ascites. Electronically Signed   By: Lucienne Capers M.D.   On: 10/21/2018 05:26    Assessment & Plan    Atrial fibrillation-rate is still somewhat rapid.  Will carefully increase his metoprolol to 37.5 mg daily and follow.  Off of amiodarone due to transaminases.  Off of warfarin for now as INR is still prolonged.  Will follow INR and resume warfarin when INR is in the therapeutic range.  Cardiomyopathy-holding Lasix for now due to renal function.  Appreciate nephrology input.  Will consider adding this back as renal function improves  Elevated transaminases-slowly improving.  Patient is gradually improving however has not been able to ambulate without assistance.  He does not appear that he can be discharged from the acute care setting to home.  Would likely need rehab facility or SNF.  Signed, Javier Docker Margaret Staggs MD 10/25/2018, 7:02 AM  Pager: [phone]

## 2018-10-25 NOTE — Progress Notes (Signed)
[facility]    MR#:  [id number]  DATE OF BIRTH:  [birth year]  SUBJECTIVE:  CHIEF COMPLAINT:   Chief Complaint  Patient presents with  . Shortness of Breath  . Weakness  Patient seen and evaluated today Has generalized weakness Comfortable on oxygen via nasal cannula at 2 L No cough No chest pain No nausea or vomiting   REVIEW OF SYSTEMS:    ROS  CONSTITUTIONAL: No documented fever. Has fatigue, weakness. No weight gain, no weight loss.  EYES: No blurry or double vision.  ENT: No tinnitus. No postnasal drip. No redness of the oropharynx.  RESPIRATORY: Has decreased cough, no wheeze, no hemoptysis.  Mild dyspnea on exertion.  CARDIOVASCULAR: No chest pain. No orthopnea. No palpitations. No syncope.  GASTROINTESTINAL: No nausea, no vomiting or diarrhea. No abdominal pain. No melena or hematochezia.  GENITOURINARY: No dysuria or hematuria.  ENDOCRINE: No polyuria or nocturia. No heat or cold intolerance.  HEMATOLOGY: No anemia. No bruising. No bleeding.  INTEGUMENTARY: No rashes. No lesions.  MUSCULOSKELETAL: No arthritis. swelling lower extremities. No gout.  NEUROLOGIC: No numbness, tingling, or ataxia. No seizure-type activity.  PSYCHIATRIC: No anxiety. No insomnia. No ADD.   DRUG ALLERGIES:   Allergies  Allergen Reactions  . Prednisone Other (See Comments)    Reaction: "organs shutting down"    VITALS:  Blood pressure 106/71, pulse 86, temperature 98.9 F (37.2 C), resp. rate 20, height 5\' 3"  (1.6 m), weight 76.2 kg, SpO2 97 %.  PHYSICAL EXAMINATION:   Physical Exam  GENERAL:  77 y.o.-year-old patient lying in the bed with no acute distress.  EYES: Pupils equal, round, reactive to light and accommodation. No scleral icterus. Extraocular muscles intact.  HEENT: Head atraumatic, normocephalic. Oropharynx and nasopharynx clear.  NECK:  Supple, no jugular venous distention. No thyroid  enlargement, no tenderness.  LUNGS : Improved breath sounds bilaterally, bibasal basal crepitations heard. No use of accessory muscles of respiration.  CARDIOVASCULAR: S1, S2 irregular. No murmurs, rubs, or gallops.  ABDOMEN: Soft, nontender, nondistended. Bowel sounds present. No organomegaly or mass.  EXTREMITIES: No cyanosis, clubbing  1+ edema b/l.    NEUROLOGIC: Cranial nerves II through XII are intact. No focal Motor or sensory deficits b/l.   PSYCHIATRIC: The patient is alert and oriented x 3.  SKIN: No obvious rash, lesion, or ulcer.   LABORATORY PANEL:   CBC Recent Labs  Lab 10/24/18 0511  WBC 8.5  HGB 9.0*  HCT 27.4*  PLT 98*   ------------------------------------------------------------------------------------------------------------------ Chemistries  Recent Labs  Lab 10/23/18 0527  10/25/18 0553  NA 134*   < > 133*  K 4.7   < > 4.6  CL 94*   < > 93*  CO2 30   < > 31  GLUCOSE 130*   < > 113*  BUN 49*   < > 44*  CREATININE 1.33*   < > 1.00  CALCIUM 7.4*   < > 8.3*  MG 1.8  --   --   AST 1,461*   < > 289*  ALT 1,934*   < > 1,017*  ALKPHOS 134*   < > 135*  BILITOT 1.9*   < > 2.6*   < > = values in this interval not displayed.   ------------------------------------------------------------------------------------------------------------------  Cardiac Enzymes Recent Labs  Lab 10/21/18 0755  TROPONINI 0.08*   ------------------------------------------------------------------------------------------------------------------  RADIOLOGY:  No results found.   ASSESSMENT AND PLAN:   77 year old male  patient with history of atrial fibrillation, aortic valve regurgitation, Barrett's esophagus, stage III lung cancer, chronic congestive heart failure, COPD, GERD, gout, hyperlipidemia, hypertension currently in ICU  -Acute ischemic hepatitis proving Liver enzymes trending down smoothly Gastroenterology follow-up appreciated Daily LFTs Avoid hepatotoxic  drugs  -Supratherapeutic INR secondary to liver failure  Currently INR is greater than 4 INR goal is 2-3 Pharmacy follow-up for resuming Coumadin dosing once INR is below 3  -Acute hyperkalemia resolved  -Right-sided heart failure with congestive hepatopathy with history of aortic valve replacement Medical management continuing  -Acute renal failure on chronic kidney disease stage III Appreciate nephrology evaluation Monitor renal function  -Stage III lung cancer Status post oncology evaluation Further chemotherapy deferred in view of poor cardiopulmonary status by oncology  -Atrial fibrillation Status post cardiology evaluation Amiodarone on hold due to hepatocellular failure Maintain INR between 2 and 3 No further intervention recommended  -Pleural effusion Hold off on IV fluids  Mucinex for expectoration PRN Robitussin for cough  -Hepatitis A positive Recommended and counseled good hand and oral hygiene Supportive care  -Ambulatory dysfunction and gait instability Status post physical therapy evaluation SNF placement once clinically stable  All the records are reviewed and case discussed with Care Management/Social Worker. Management plans discussed with the patient, family and they are in agreement.  CODE STATUS: Full code  DVT Prophylaxis: SCDs  TOTAL TIME TAKING CARE OF THIS PATIENT: 33 minutes.   POSSIBLE D/C IN 2 to 3 DAYS, DEPENDING ON CLINICAL CONDITION.  Saundra Shelling M.D on 10/25/2018 at 10:47 AM  Between 7am to 6pm - Pager - [phone]  After 6pm go to [email or web address] - password [email or web address] [facility]  Office  [phone]  CC: Primary care physician; Guadalupe Maple, MD  Note: This dictation was prepared with Dragon dictation along with smaller phrase technology. Any transcriptional errors that result from this process are unintentional.

## 2018-10-25 NOTE — Progress Notes (Signed)
Dr Estanislado Pandy notified of critical INR of 4.14 via text

## 2018-10-25 NOTE — Progress Notes (Signed)
Physical Therapy Treatment Patient Details Name: Kirk Jones MRN: [id number] DOB: 1941/03/30 Today's Date: 10/25/2018    History of Present Illness Pt is a 77 y.o. male presenting to hospital 10/20/18 with SOB and weakness; recent fall with L hand skin tear.  Pt admitted with acute ischemic hepatitis, supratherapeutic INR, acute hyperkalemia, R sided heart failure with congestive hepatopathy, and a-fib.  PMH includes stage 3 lung CA on 2-3 L home O2, CHF, COPD, a-fib on warfarin, Barrett's esophagus, AICD, aortic aneurysm, expressive aphasia, CVA, AVR, CTR, h/o R femur fx surgery.    PT Comments    Pt able to progress to standing marching in place x10 reps B LE's (B UE support on RW) and then ambulate 6 feet with RW with assist; limited activity/distance ambulating d/t HR elevation up to 140 bpm (at resting HR fluctuating between 104 bpm to 126 bpm).  O2 sats 95% or greater on 2 L O2 via nasal cannula during session (taking via ear d/t unable to get readings on fingers).  L UE weeping (nurse aware).  Will continue to progress pt with strengthening and progressive ambulation per pt tolerance.    Follow Up Recommendations  SNF     Equipment Recommendations  (pt has RW and 4ww at home already)    Recommendations for Other Services OT consult     Precautions / Restrictions Precautions Precautions: Fall Precaution Comments: R chest port Restrictions Weight Bearing Restrictions: No    Mobility  Bed Mobility               General bed mobility comments: Deferred (pt sitting in chair beginning/end of session)  Transfers Overall transfer level: Needs assistance Equipment used: Rolling walker (2 wheeled)(youth sized) Transfers: Sit to/from Stand Sit to Stand: Min assist         General transfer comment: x2 trials; assist to initiate stand up to RW  Ambulation/Gait Ambulation/Gait assistance: Min guard;Min assist;+2 safety/equipment(chair follow) Gait Distance  (Feet): 6 Feet Assistive device: Rolling walker (2 wheeled)(youth sized)   Gait velocity: decreased   General Gait Details: pt initially stood and performed standing marching B LE's x10 reps with UE support on RW and then sat down to rest prior to ambulation; decreased B step length/foot clearance/heelstrike; forward posture requiring cueing for upright posture   Stairs             Wheelchair Mobility    Modified Rankin (Stroke Patients Only)       Balance Overall balance assessment: Needs assistance Sitting-balance support: No upper extremity supported;Feet supported Sitting balance-Leahy Scale: Good Sitting balance - Comments: steady sitting reaching within BOS   Standing balance support: Bilateral upper extremity supported Standing balance-Leahy Scale: Poor Standing balance comment: pt requiring B UE support for static standing balance                            Cognition Arousal/Alertness: Awake/alert Behavior During Therapy: WFL for tasks assessed/performed Overall Cognitive Status: Within Functional Limits for tasks assessed                                        Exercises General Exercises - Lower Extremity Long Arc Quad: AROM;Strengthening;Both;10 reps;Seated Hip Flexion/Marching: AROM;Strengthening;Both;10 reps;Seated    General Comments General comments (skin integrity, edema, etc.): L UE weaping (nurse aware).  Pt and pt's wife educated on pacing/activity modification  and gradual progression of mobility with staff assist for safety (staff assist to stand to use urinal and walk short distance to [facility]): pt and pt's wife verbalizing understanding.      Pertinent Vitals/Pain Pain Assessment: 0-10 Pain Score: 2  Pain Location: buttocks Pain Descriptors / Indicators: Sore Pain Intervention(s): Limited activity within patient's tolerance;Monitored during session;Repositioned    Home Living                      Prior  Function            PT Goals (current goals can now be found in the care plan section) Acute Rehab PT Goals Patient Stated Goal: to improve strength and mobility PT Goal Formulation: With patient Time For Goal Achievement: 11/07/18 Potential to Achieve Goals: Fair Progress towards PT goals: Progressing toward goals    Frequency    Min 2X/week      PT Plan Current plan remains appropriate    Co-evaluation              AM-PAC PT "6 Clicks" Mobility   Outcome Measure  Help needed turning from your back to your side while in a flat bed without using bedrails?: A Little Help needed moving from lying on your back to sitting on the side of a flat bed without using bedrails?: A Little Help needed moving to and from a bed to a chair (including a wheelchair)?: A Little Help needed standing up from a chair using your arms (e.g., wheelchair or bedside chair)?: A Little Help needed to walk in hospital room?: A Lot Help needed climbing 3-5 steps with a railing? : Total 6 Click Score: 15    End of Session Equipment Utilized During Treatment: Gait belt;Oxygen(2 L O2 via nasal cannula) Activity Tolerance: Patient limited by fatigue;Other (comment)(Limited d/t elevated HR with activity) Patient left: in chair;with call bell/phone within reach;with chair alarm set;with family/visitor present;Other (comment)(L UE elevated on pillow) Nurse Communication: Mobility status;Precautions;Other (comment)(Pt's HR during session and L UE weaping) PT Visit Diagnosis: Other abnormalities of gait and mobility (R26.89);Muscle weakness (generalized) (M62.81);History of falling (Z91.81);Difficulty in walking, not elsewhere classified (R26.2)     Time: 1025-1105 PT Time Calculation (min) (ACUTE ONLY): 40 min  Charges:  $Therapeutic Exercise: 8-22 mins $Therapeutic Activity: 23-37 mins                    Leitha Bleak, PT 10/25/18, 12:47 PM [phone]

## 2018-10-25 NOTE — Progress Notes (Addendum)
Entered in error

## 2018-10-25 NOTE — Telephone Encounter (Signed)
Phone call Discussed with patient's daughter end-of-life care and further care for his lung cancer.  Discuss radiation therapy as chemotherapy is being ruled out by oncologist.  Patient is very weak and about to be discharged to nursing home [facility].

## 2018-10-25 NOTE — Progress Notes (Signed)
   [name], MD [address], [place], [place], [place], [address] [address], [place], [place], [place], [address] Phone: [phone]  Fax: [phone]   Subjective: Patient denies any abdominal pain.  No nausea or vomiting   Objective: Exam: Vital signs in last 24 hours: Vitals:   10/24/18 0839 10/24/18 2114 10/25/18 0500 10/25/18 0548  BP: (!) 123/96 98/64  106/71  Pulse: 95 98  86  Resp:  20  20  Temp:  98.2 F (36.8 C)  98.9 F (37.2 C)  TempSrc:      SpO2:  100%  97%  Weight:   76.2 kg   Height:       Weight change: 0.045 kg  Intake/Output Summary (Last 24 hours) at 10/25/2018 1119 Last data filed at 10/25/2018 9326 Gross per 24 hour  Intake 360 ml  Output 375 ml  Net -15 ml    General: No acute distress, AAO x3 Abd: Soft, NT/ND, No HSM Skin: Warm, no rashes Neck: Supple, Trachea midline   Lab Results: Lab Results  Component Value Date   WBC 8.5 10/24/2018   HGB 9.0 (L) 10/24/2018   HCT 27.4 (L) 10/24/2018   MCV 94.2 10/24/2018   PLT 98 (L) 10/24/2018   Micro Results: Recent Results (from the past 240 hour(s))  MRSA PCR Screening     Status: None   Collection Time: 10/21/18 11:27 AM  Result Value Ref Range Status   MRSA by PCR NEGATIVE NEGATIVE Final    Comment:        The GeneXpert MRSA Assay (FDA approved for NASAL specimens only), is one component of a comprehensive MRSA colonization surveillance program. It is not intended to diagnose MRSA infection nor to guide or monitor treatment for MRSA infections. Performed at [facility], [address] [place], [place] [address]    Studies/Results: No results found. Medications:  Scheduled Meds: . finasteride  5 mg Oral Daily  . guaiFENesin  600 mg Oral BID  . insulin aspart  0-5 Units Subcutaneous QHS  . insulin aspart  0-9 Units Subcutaneous TID WC  . metoprolol succinate  37.5 mg Oral Daily  . pantoprazole  40 mg Oral Daily  . tiotropium  18 mcg  Inhalation Daily   Continuous Infusions: PRN Meds:.bisacodyl, guaiFENesin-dextromethorphan, senna-docusate   Assessment: Elevated liver enzymes  Plan: Elevated liver enzymes due to positive hepatitis A IgM, along with contribution from ischemic hepatitis and volume overload Liver enzymes continue to improve No abdominal pain, and no evidence of biliary obstruction If bilirubin continues to rise, can consider MRCP Patient is afebrile and hemodynamically stable Continue good nutrition Continue daily CMP Avoid hepatotoxic drugs patient should follow-up in clinic after discharge  Patient was educated on good oral, food and hand hygiene.  Family at bedside and they were educated about the same not sharing foods.  Health department has contacted patient's wife and answered to obtain hepatitis A vaccine.  Patient and his family should follow-up with their primary care doctors in this regard.  Dr. Alice Reichert is on service for this long weekend and patient was signed out to him and he will be following the patient.  Please page with any questions.   LOS: 4 days   [name], MD 10/25/2018, 11:19 AM

## 2018-10-25 NOTE — Progress Notes (Signed)
ANTICOAGULATION CONSULT NOTE - Initial Consult  Pharmacy Consult for Warfarin Indication: atrial fibrillation  Allergies  Allergen Reactions  . Prednisone Other (See Comments)    Reaction: "organs shutting down"    Patient Measurements: Height: 5\' 3"  (160 cm) Weight: 168 lb (76.2 kg) IBW/kg (Calculated) : 56.9  Vital Signs: Temp: 98.9 F (37.2 C) (11/27 0548) BP: 106/71 (11/27 0548) Pulse Rate: 86 (11/27 0548)  Labs: Recent Labs    10/22/18 2309 10/23/18 0527 10/23/18 0849 10/23/18 1719 10/23/18 2259 10/24/18 0511 10/25/18 0553 10/25/18 0734  HGB 8.8* 8.8*  --   --   --  9.0*  --   --   HCT 26.8* 26.2*  --   --   --  27.4*  --   --   PLT 111* 111*  --   --   --  98*  --   --   APTT 34  --  35 33 32 33  --   --   LABPROT 39.8*  --  38.0*  --   --  41.0*  --  38.9*  INR 4.19*  --  3.95  --   --  4.36*  --  4.14*  CREATININE  --  1.33*  --   --   --  1.10 1.00  --     Estimated Creatinine Clearance: 56.5 mL/min (by C-G formula based on SCr of 1 mg/dL).   Medical History: Past Medical History:  Diagnosis Date  . AICD (automatic cardioverter/defibrillator) present   . Aortic valve regurgitation   . Atrial fibrillation ([facility])   . Barrett's esophagus   . Cancer ([facility])    sate III lung  . CHF (congestive heart failure) ([facility])   . COPD (chronic obstructive pulmonary disease) ([facility])   . Diverticulosis   . Dysrhythmia   . GERD (gastroesophageal reflux disease)   . Gout   . Headache    aura only  . Heart murmur   . Hematuria   . Hernia of abdominal cavity   . History of prosthetic heart valve   . Hyperlipidemia   . Hypertension   . Nodular prostate with urinary obstruction   . Presence of permanent cardiac pacemaker   . Renal insufficiency   . Substance abuse ([facility])    alcohol    Medications:  Scheduled:  . finasteride  5 mg Oral Daily  . guaiFENesin  600 mg Oral BID  . insulin aspart  0-5 Units Subcutaneous QHS  . insulin aspart  0-9 Units Subcutaneous  TID WC  . metoprolol succinate  37.5 mg Oral Daily  . pantoprazole  40 mg Oral Daily  . tiotropium  18 mcg Inhalation Daily   Infusions:   PRN: bisacodyl, guaiFENesin-dextromethorphan, senna-docusate  Assessment: 77 year old male with hx of Afib, presented with SOB and weakness. Transaminasemia holding amiodarone. INR elevated but trending down.Home regimen is warfarin 3 mg daily.   Date INR Warfarin Dose  11/23 7.26 None- vitamin K 5 mg x1   11/24 5.13 None  11/24 4.67 None  11/24 4.19 None  11/25 3.95 Hold  11/26 4.36 Hold   11/27 4.14 Hold    Goal of Therapy:  INR 2-3 Monitor platelets by anticoagulation protocol: Yes   Plan:  Will hold warfarin until INR is < 3. Monitor LFTs - trending down. Daily INR and CBC will be ordered.   Oswald Hillock, PharmD  Clinical Pharmacist  10/25/2018,10:48 AM

## 2018-10-25 NOTE — Telephone Encounter (Signed)
Copied from [place] [id number]. Topic: General - Call Back - No Documentation >> Oct 25, 2018 11:17 AM Kirk Jones R wrote: Patients daughter called in to advise Dr Jeananne Rama that the patient is currently in hospital and they want him to contact the family to discuss a plan for the patient  CB# [phone]

## 2018-10-25 NOTE — Progress Notes (Signed)
[place]  [phone] [phone]   Name: Kirk Jones [facility] Date: 10/25/2018 MRN: [id number]  DOB: Feb 20, 1941  Patient Care Team: Guadalupe Maple, MD as PCP - General (Family Medicine) Murlean Iba, MD (Internal Medicine) Isaias Cowman, MD as Consulting Physician (Cardiology) Christene Lye, MD (General Surgery) Telford Nab, RN as Registered Nurse Knox Royalty, RN as [place]: Palliative Care consult requested for this 77 y.o. male with multiple medical problems including undergoing concurrent chemoradiation treated with carbotaxol, severe aortic valve disease status post valve replacement 2004, CAD status post PCI, ischemic cardiomyopathy status post AICD insertion in 2012 with history of CHF.  PAF on warfarin, history of AAA, and CKD.  Patient was hospitalized on 10/11/2018 to 10/15/18 with syncope with sustained laceration to his head requiring sutures.  Workup revealed SVT and patient was started on amiodarone. He is hospitalized again 10/21/18 with shortness of breath and weakness. Workup revealed acute liver failure likely from congestive hepatopathy and acute on chronic kidney disease. Palliative care was consulted to help address goals.   CODE STATUS: Full code  PAST MEDICAL HISTORY: Past Medical History:  Diagnosis Date  . AICD (automatic cardioverter/defibrillator) present   . Aortic valve regurgitation   . Atrial fibrillation ([place])   . Barrett's esophagus   . Cancer ([place])    sate III lung  . CHF (congestive heart failure) ([place])   . COPD (chronic obstructive pulmonary disease) ([place])   . Diverticulosis   . Dysrhythmia   . GERD (gastroesophageal reflux disease)   . Gout   . Headache    aura only  . Heart murmur   . Hematuria   . Hernia of abdominal cavity   . History of prosthetic heart valve   . Hyperlipidemia   .  Hypertension   . Nodular prostate with urinary obstruction   . Presence of permanent cardiac pacemaker   . Renal insufficiency   . Substance abuse ([place])    alcohol    PAST SURGICAL HISTORY:  Past Surgical History:  Procedure Laterality Date  . CARDIAC VALVE REPLACEMENT N/A 2004  . CARPAL TUNNEL RELEASE Right 2015  . CATARACT EXTRACTION Right 2017  . COLONOSCOPY WITH PROPOFOL N/A 2014  . CORONARY ANGIOPLASTY     stints x2  . EYE SURGERY     cross eyed  . FOOT SURGERY Bilateral    [place]  . FRACTURE SURGERY Right    [place]  . INGUINAL HERNIA REPAIR Right 09/16/2017   Procedure: HERNIA REPAIR INGUINAL ADULT;  Surgeon: Christene Lye, MD;  Location: [facility] ORS;  Service: General;  Laterality: Right;  . INSERT / REPLACE / [place]     ICD  . PACEMAKER IMPLANT Left 2013   and ICD  . PORTA CATH INSERTION N/A 09/21/2018   Procedure: PORTA CATH INSERTION;  Surgeon: Algernon Huxley, MD;  Location: [place] [facility];  Service: Cardiovascular;  Laterality: N/A;  . VASECTOMY      HEMATOLOGY/ONCOLOGY HISTORY:    Squamous cell carcinoma of right lung ([place])   09/14/2018 Cancer Staging    Staging form: Lung, AJCC 8th Edition - Clinical stage from 09/14/2018: Stage IIIA (cT3, cN1, cM0) - Signed by Sindy Guadeloupe, MD on 09/16/2018    09/16/2018 Initial Diagnosis    Squamous cell carcinoma of right lung ([place])    09/27/2018 -  Chemotherapy    The patient had palonosetron (  ALOXI) injection 0.25 mg, 0.25 mg, Intravenous,  Once, 3 of 4 cycles Administration: 0.25 mg (09/28/2018), 0.25 mg (10/05/2018), 0.25 mg (10/19/2018) CARBOplatin (PARAPLATIN) 180 mg in sodium chloride 0.9 % 250 mL chemo infusion, 180 mg (100 % of original dose 182.6 mg), Intravenous,  Once, 3 of 4 cycles Dose modification:   (original dose 182.6 mg, Cycle 1) Administration: 180 mg (09/28/2018), 160 mg (10/05/2018), 110 mg (10/19/2018) PACLitaxel (TAXOL) 84 mg in sodium chloride 0.9 % 250 mL chemo infusion  (</= 80mg /m2), 45 mg/m2 = 84 mg, Intravenous,  Once, 3 of 4 cycles Administration: 84 mg (09/28/2018), 84 mg (10/05/2018), 84 mg (10/19/2018)  for chemotherapy treatment.      ALLERGIES:  is allergic to prednisone.  MEDICATIONS:  Current Facility-Administered Medications  Medication Dose Route Frequency Provider Last Rate Last Dose  . bisacodyl (DULCOLAX) EC tablet 5 mg  5 mg Oral Daily PRN Arta Silence, MD   5 mg at 10/24/18 0848  . finasteride (PROSCAR) tablet 5 mg  5 mg Oral Daily Arta Silence, MD   5 mg at 10/25/18 1004  . guaiFENesin (MUCINEX) 12 hr tablet 600 mg  600 mg Oral BID Saundra Shelling, MD   600 mg at 10/25/18 1004  . guaiFENesin-dextromethorphan (ROBITUSSIN DM) 100-10 MG/5ML syrup 5 mL  5 mL Oral Q4H PRN Pyreddy, Pavan, MD      . insulin aspart (novoLOG) injection 0-5 Units  0-5 Units Subcutaneous QHS Sridharan, Prasanna, MD      . insulin aspart (novoLOG) injection 0-9 Units  0-9 Units Subcutaneous TID WC Arta Silence, MD   1 Units at 10/23/18 1802  . metoprolol succinate (TOPROL-XL) 24 hr tablet 37.5 mg  37.5 mg Oral Daily Teodoro Spray, MD   37.5 mg at 10/25/18 1003  . pantoprazole (PROTONIX) EC tablet 40 mg  40 mg Oral Daily Arta Silence, MD   40 mg at 10/25/18 1004  . senna-docusate (Senokot-S) tablet 1 tablet  1 tablet Oral QHS PRN Arta Silence, MD      . tiotropium (SPIRIVA) inhalation capsule ([facility] use ONLY) 18 mcg  18 mcg Inhalation Daily Pyreddy, Reatha Harps, MD   18 mcg at 10/25/18 1004   Facility-Administered Medications Ordered in Other Encounters  Medication Dose Route Frequency Provider Last Rate Last Dose  . 0.9 %  sodium chloride infusion   Intravenous Continuous Bruning, Lennette Bihari, PA-C        VITAL SIGNS: BP 106/71 (BP Location: Right Arm)   Pulse 86   Temp 98.9 F (37.2 C)   Resp 20   Ht 5\' 3"  (1.6 m)   Wt 168 lb (76.2 kg)   SpO2 97%   BMI 29.76 kg/m  Filed Weights   10/23/18 0500 10/24/18 0419 10/25/18 0500  Weight:  168 lb 4.8 oz (76.3 kg) 167 lb 14.4 oz (76.2 kg) 168 lb (76.2 kg)    Estimated body mass index is 29.76 kg/m as calculated from the following:   Height as of this encounter: 5\' 3"  (1.6 m).   Weight as of this encounter: 168 lb (76.2 kg).  LABS: CBC:    Component Value Date/Time   WBC 8.5 10/24/2018 0511   HGB 9.0 (L) 10/24/2018 0511   HGB 13.6 11/10/2017 1328   HCT 27.4 (L) 10/24/2018 0511   HCT 40.8 11/10/2017 1328   PLT 98 (L) 10/24/2018 0511   PLT 255 11/10/2017 1328   MCV 94.2 10/24/2018 0511   MCV 103 (H) 11/10/2017 1328   MCV 106 (H) 11/20/2014 0093  NEUTROABS 10.2 (H) 10/23/2018 0527   NEUTROABS 5.1 11/10/2017 1328   NEUTROABS 4.7 11/20/2014 0549   LYMPHSABS 0.0 (L) 10/23/2018 0527   LYMPHSABS 0.8 11/10/2017 1328   LYMPHSABS 1.0 11/20/2014 0549   MONOABS 0.0 (L) 10/23/2018 0527   MONOABS 1.0 11/20/2014 0549   EOSABS 0.0 10/23/2018 0527   EOSABS 0.1 11/10/2017 1328   EOSABS 0.2 11/20/2014 0549   BASOSABS 0.0 10/23/2018 0527   BASOSABS 0.1 11/10/2017 1328   BASOSABS 0.1 11/20/2014 0549   Comprehensive Metabolic Panel:    Component Value Date/Time   NA 133 (L) 10/25/2018 0553   NA 136 05/08/2018 1507   NA 140 11/18/2014 0514   K 4.6 10/25/2018 0553   K 4.2 11/18/2014 0514   CL 93 (L) 10/25/2018 0553   CL 106 11/18/2014 0514   CO2 31 10/25/2018 0553   CO2 27 11/18/2014 0514   BUN 44 (H) 10/25/2018 0553   BUN 21 05/08/2018 1507   BUN 17 11/18/2014 0514   CREATININE 1.00 10/25/2018 0553   CREATININE 1.35 (H) 11/18/2014 0514   GLUCOSE 113 (H) 10/25/2018 0553   GLUCOSE 93 11/18/2014 0514   CALCIUM 8.3 (L) 10/25/2018 0553   CALCIUM 8.5 11/18/2014 0514   AST 289 (H) 10/25/2018 0553   AST 40 (H) 05/08/2018 1122   AST 68 (H) 11/16/2014 0031   ALT 1,017 (H) 10/25/2018 0553   ALT 23 05/08/2018 1122   ALT 102 (H) 11/16/2014 0031   ALKPHOS 135 (H) 10/25/2018 0553   ALKPHOS 614 (H) 11/16/2014 0031   BILITOT 2.6 (H) 10/25/2018 0553   BILITOT 0.5 10/24/2017 0910    BILITOT 0.6 11/16/2014 0031   PROT 5.2 (L) 10/25/2018 0553   PROT 6.7 10/24/2017 0910   PROT 7.2 11/16/2014 0031   ALBUMIN 2.7 (L) 10/25/2018 0553   ALBUMIN 4.4 10/24/2017 0910   ALBUMIN 3.1 (L) 11/16/2014 0031    RADIOGRAPHIC STUDIES: Dg Chest 1 View  Result Date: 10/21/2018 CLINICAL DATA:  77 y/o  M; aspiration into the respiratory tract. EXAM: CHEST  1 VIEW COMPARISON:  10/21/2018 chest CT and chest radiograph. FINDINGS: Stable right basilar opacity and right-sided pleural effusion. Interstitial pulmonary edema. Right port catheter tip projects over mid SVC. Stable cardiac silhouette given projection and technique. Single lead AICD. Post median sternotomy with wires in alignment. Calcific aortic atherosclerosis. No acute osseous abnormality is evident. IMPRESSION: Stable right basilar opacity corresponding to mass on CT and right-sided pleural effusion. Interstitial pulmonary edema. Aortic Atherosclerosis (ICD10-I70.0). Electronically Signed   By: Kristine Garbe M.D.   On: 10/21/2018 20:23   Ct Head Wo Contrast  Result Date: 10/12/2018 CLINICAL DATA:  Acute onset of dizziness and fall. Hit head on headboard. Laceration at the right temple. Patient on Coumadin. Initial encounter. EXAM: CT HEAD WITHOUT CONTRAST TECHNIQUE: Contiguous axial images were obtained from the base of the skull through the vertex without intravenous contrast. COMPARISON:  CT of the head performed 09/04/2018 FINDINGS: Brain: No evidence of acute infarction, hemorrhage, hydrocephalus, extra-axial collection or mass lesion / mass effect. Prominence of the ventricles and sulci reflects mild to moderate cortical volume loss. Cerebellar atrophy is noted. Scattered periventricular and subcortical white matter change likely reflects small vessel ischemic microangiopathy. A small chronic lacunar infarct is noted at the left cerebellar hemisphere. The brainstem and fourth ventricle are within normal limits. The basal  ganglia are unremarkable in appearance. The cerebral hemispheres demonstrate grossly normal gray-white differentiation. No mass effect or midline shift is seen. Vascular: No  hyperdense vessel or unexpected calcification. Skull: There is no evidence of fracture; visualized osseous structures are unremarkable in appearance. Sinuses/Orbits: The visualized portions of the orbits are within normal limits. The paranasal sinuses and mastoid air cells are well-aerated. Other: A prominent soft tissue laceration is noted anterior to the right ear, with soft tissue air extending below the right ear. IMPRESSION: 1. No evidence of traumatic intracranial injury or fracture. 2. Prominent soft tissue laceration anterior to the right ear, with soft tissue air extending below the right ear. 3. Mild to moderate cortical volume loss and scattered small vessel ischemic microangiopathy. 4. Small chronic lacunar infarct at the left cerebellar hemisphere. Electronically Signed   By: Garald Balding M.D.   On: 10/12/2018 00:18   Ct Chest Wo Contrast  Result Date: 10/21/2018 CLINICAL DATA:  Shortness of breath and weakness. EXAM: CT CHEST WITHOUT CONTRAST TECHNIQUE: Multidetector CT imaging of the chest was performed following the standard protocol without IV contrast. COMPARISON:  08/21/2018 FINDINGS: Cardiovascular: Evaluation of vascular structures is limited without IV contrast material. Cardiac pacemaker. Diffuse cardiac enlargement. No pericardial effusions. Coronary artery calcifications. Postoperative changes in the mediastinum. Calcification throughout the aorta. Mediastinum/Nodes: Moderate prominent lymph nodes throughout the mediastinum without change since prior study, likely reactive. Lungs/Pleura: Bilateral pleural effusions, greater on the right. Atelectasis in the lung bases. Known right lower lobe mass is visible but somewhat obscured by the effusion and atelectasis. Emphysematous changes in the lungs. No pneumothorax.  Airways are patent. Upper Abdomen: Probable hepatic cirrhosis. Free fluid in the right upper quadrant likely represents ascites. Musculoskeletal: Degenerative changes in the spine. No destructive bone lesions. Postoperative sternotomy wires. IMPRESSION: Known mass in the right lung base. Cardiac enlargement. Bilateral pleural effusions with basilar atelectasis, greater on the right. Mild progression on the right since previous study. Hepatic cirrhosis with upper abdominal ascites. Electronically Signed   By: Lucienne Capers M.D.   On: 10/21/2018 06:53   Dg Hand 2 View Left  Result Date: 10/12/2018 CLINICAL DATA:  Pt reports he had a fall last night and is now experiencing pain in his left hand. Pt hand appears swollen and red at this time EXAM: LEFT HAND - 2 VIEW COMPARISON:  None. FINDINGS: IV tubing overlies the wrist. There are degenerative changes insert wrist, primarily along the radial aspect of the carpals and the first carpometacarpal joint. There is degenerative change at the second metacarpophalangeal joint. No acute fracture or subluxation. Atherosclerotic calcification of the small arteries. IMPRESSION: No evidence for acute  abnormality.  Degenerative changes. Electronically Signed   By: Nolon Nations M.D.   On: 10/12/2018 15:27   US Venous Img Upper Uni Left  Result Date: 10/15/2018 CLINICAL DATA:  Left arm swelling. Recent fall. Left cardiac ICD. Edema from the elbow to the wrist. EXAM: LEFT UPPER EXTREMITY VENOUS DOPPLER ULTRASOUND TECHNIQUE: Gray-scale sonography with graded compression, as well as color Doppler and duplex ultrasound were performed to evaluate the upper extremity deep venous system from the level of the subclavian vein and including the jugular, axillary, basilic, radial, ulnar and upper cephalic vein. Spectral Doppler was utilized to evaluate flow at rest and with distal augmentation maneuvers. COMPARISON:  Chest radiograph 10/11/2018 FINDINGS: Contralateral  Subclavian Vein: Normal respiratory phasicity. Normal color Doppler flow in the right subclavian vein. No evidence for right subclavian thrombus. Internal Jugular Vein: No evidence of thrombus. Normal compressibility, respiratory phasicity and response to augmentation. Subclavian Vein: The left cardiac ICD lead is noted. There appears to be flow  around the lead and left subclavian vein appears to be patent without thrombus. Axillary Vein: No evidence of thrombus. Normal compressibility, respiratory phasicity and response to augmentation. Cephalic Vein: No evidence of thrombus. Normal compressibility, respiratory phasicity and response to augmentation. Basilic Vein: No evidence of thrombus. Normal compressibility, respiratory phasicity and response to augmentation. Brachial Veins: No evidence of thrombus. Normal compressibility, respiratory phasicity and response to augmentation. Radial Veins: No evidence of thrombus. Normal compressibility, respiratory phasicity and response to augmentation. Single radial vein is identified. Ulnar Veins: No evidence of thrombus. Normal compressibility, respiratory phasicity and response to augmentation. Single ulnar vein is identified. Venous Reflux:  None visualized. Other Findings:  Subcutaneous edema. IMPRESSION: No evidence of DVT within the left upper extremity. Limited evaluation of the left subclavian vein due to the cardiac ICD but no evidence for thrombus in this area. Electronically Signed   By: Markus Daft M.D.   On: 10/15/2018 14:58   Ct Abdomen W Wo Contrast  Result Date: 09/25/2018 CLINICAL DATA:  Evaluate pancreatic calcifications EXAM: CT ABDOMEN WITHOUT AND WITH CONTRAST TECHNIQUE: Multidetector CT imaging of the abdomen was performed following the standard protocol before and following the bolus administration of intravenous contrast. CONTRAST:  122mL ISOVUE-300 IOPAMIDOL (ISOVUE-300) INJECTION 61% COMPARISON:  PET-CT 08/30/2018. FINDINGS: Lower chest: Large  right lower lobe lung mass is again identified measuring 7.9 cm. Loculated pleural fluid is identified overlying the right midlung and right lower lobe. Small left pleural effusion noted. Aortic atherosclerosis is identified. Hepatobiliary: No focal liver abnormality. The gallbladder is not confidently identified and may be surgically absent. Pancreas: No pancreatic inflammation identified faint punctate areas of mild hyperattenuation within the head of pancreas are again identified within head of pancreas, image 39/2 and may represent small calcifications. 1.4 cm low-density structure within the head of pancreas is identified measuring 32 HU, image 96/14. This is favored to represent a mildly dilated common bile duct. This roughly corresponds to the areas of calcification within the head of pancreas which may reflect choledocholithiasis. Body and tail of pancreas appear normal. Spleen: Spleen normal. Adrenals/Urinary Tract: Unremarkable appearance of the adrenal glands. There is a cyst arising from inferior pole of right kidney measuring 3 cm, image 46/11. Smaller cysts noted within the upper pole of the right kidney. Multiple areas of cortical scarring involve the left kidney. Stomach/Bowel: Stomach and small bowel loops have a normal course and caliber. Extensive colonic diverticulosis noted. Vascular/Lymphatic: Aortic atherosclerosis. No aneurysm. No adenopathy identified within the abdomen. Other: Trace free fluid identified along the pericolic gutters. Musculoskeletal: Mild thoraco lumbar scoliosis and multilevel degenerative disc disease. IMPRESSION: 1. Faint calcifications within head of pancreas are again noted. There is cystic area of low attenuation within the head of pancreas which may represent a mildly dilated common bile duct. Calcifications may represent choledocholithiasis. In a patient who cannot have an MRI due to pacer device ERCP may be helpful to confirm common bile duct stones. 2. Large right  lower lobe lung mass with surrounding loculated pleural fluid 3.  Aortic Atherosclerosis (ICD10-I70.0). Electronically Signed   By: Kerby Moors M.D.   On: 09/25/2018 16:27   Dg Chest Port 1 View  Result Date: 10/21/2018 CLINICAL DATA:  Shortness of breath. EXAM: PORTABLE CHEST 1 VIEW COMPARISON:  Chest x-rays dated 10/11/2018 and 08/10/2018. FINDINGS: Increased opacity at the RIGHT lung base. Probable mild atelectasis and/or small pleural effusion at the LEFT lung base. No pneumothorax seen. No acute or suspicious osseous finding. Stable cardiomegaly. RIGHT chest  wall Port-A-Cath appears stable in position with tip at the level of the mid/lower SVC. LEFT chest wall pacemaker/ICD apparatus is stable in position. Median sternotomy wires appear intact and stable in alignment. IMPRESSION: 1. Increasing opacity at the RIGHT lung base. This is mostly related to the RIGHT lower lobe mass identified on recent chest CT (08/21/2018) and PET-CT (08/30/2018), likely with superimposed pleural effusion and/or atelectasis that has slightly increased in the interval. 2. Stable cardiomegaly. 3. Probable mild atelectasis and/or small pleural effusion at the LEFT lung base. Electronically Signed   By: Franki Cabot M.D.   On: 10/21/2018 00:32   Dg Chest Port 1 View  Result Date: 10/12/2018 CLINICAL DATA:  Status post fall, with concern for chest injury. Initial encounter. EXAM: PORTABLE CHEST 1 VIEW COMPARISON:  CT of the chest performed 08/21/2018 FINDINGS: There is a persistent small right-sided pleural effusion. The known right lower lobe mass is better characterized on prior CT. There is suggestion of underlying interstitial edema, which may be transient in nature. No pneumothorax is seen. The cardiomediastinal silhouette is borderline normal in size. The patient is status post median sternotomy. An AICD is noted overlying the left chest wall, with a single lead ending overlying the right ventricle. The patient's  right chest port is noted ending about the mid SVC. No displaced rib fractures are seen. IMPRESSION: 1. Persistent small right-sided pleural effusion. Known right lower lobe mass is better characterized on prior CT. Suggestion of underlying interstitial edema, which may be transient in nature. 2. No displaced rib fracture seen. Electronically Signed   By: Garald Balding M.D.   On: 10/12/2018 00:22   US Liver Doppler  Result Date: 10/21/2018 CLINICAL DATA:  Abnormal transaminases. Patient undergoing chemotherapy and radiation for stage III lung cancer. EXAM: DUPLEX ULTRASOUND OF LIVER TECHNIQUE: Color and duplex Doppler ultrasound was performed to evaluate the hepatic in-flow and out-flow vessels. COMPARISON:  CT abdomen 09/25/2018 FINDINGS: Gallbladder: The gallbladder is not definitively identified. In the gallbladder fossa, there is a structure which is could represent a severely contracted gallbladder. No significant gallbladder lumen is visualized. No stones are identified. Murphy's sign is negative. Common bile duct: Diameter: 4.1 mm, normal Liver: normal parenchymal echogenicity. Normal hepatic contour without nodularity. No focal lesion, mass or intrahepatic biliary ductal dilatation. Portal Vein Velocities Main:  29 cm/sec Right:  39 cm/sec Left:  116 cm/sec Main portal vein demonstrates bidirectional flow with intermittent hepatopetal and hepatofugal flow direction. Right and left portal veins demonstrate normal hepatopetal flow direction. Hepatic Vein Velocities Right:  32 cm/sec Middle:  56 cm/sec Left:  30 cm/sec Normal flow direction demonstrated in the hepatic veins. IVC: Present and patent with normal respiratory phasicity. Velocity measures 36 centimeters/second. Hepatic Artery Velocity:  130/32 cm/sec Splenic Vein Velocity:  48 cm/sec Varices: Not identified. Ascites: Minimal fluid over the liver edge. Incidental note of small right pleural effusion. IMPRESSION: 1. Gallbladder is markedly  contracted, likely to be physiologic but nonspecific. No stones identified. 2. Normal flow velocities demonstrated in the portal and hepatic veins. Bidirectional flow is demonstrated in the main portal vein. Bidirectional flow may be due to Valsalva or early portal hypertension. 3. Right pleural effusion and small amount of ascites. Electronically Signed   By: Lucienne Capers M.D.   On: 10/21/2018 05:26   US Abdomen Limited Ruq  Result Date: 10/21/2018 CLINICAL DATA:  Abnormal transaminases. Patient undergoing chemotherapy and radiation for stage III lung cancer. EXAM: DUPLEX ULTRASOUND OF LIVER TECHNIQUE: Color and duplex  Doppler ultrasound was performed to evaluate the hepatic in-flow and out-flow vessels. COMPARISON:  CT abdomen 09/25/2018 FINDINGS: Gallbladder: The gallbladder is not definitively identified. In the gallbladder fossa, there is a structure which is could represent a severely contracted gallbladder. No significant gallbladder lumen is visualized. No stones are identified. Murphy's sign is negative. Common bile duct: Diameter: 4.1 mm, normal Liver: normal parenchymal echogenicity. Normal hepatic contour without nodularity. No focal lesion, mass or intrahepatic biliary ductal dilatation. Portal Vein Velocities Main:  29 cm/sec Right:  39 cm/sec Left:  116 cm/sec Main portal vein demonstrates bidirectional flow with intermittent hepatopetal and hepatofugal flow direction. Right and left portal veins demonstrate normal hepatopetal flow direction. Hepatic Vein Velocities Right:  32 cm/sec Middle:  56 cm/sec Left:  30 cm/sec Normal flow direction demonstrated in the hepatic veins. IVC: Present and patent with normal respiratory phasicity. Velocity measures 36 centimeters/second. Hepatic Artery Velocity:  130/32 cm/sec Splenic Vein Velocity:  48 cm/sec Varices: Not identified. Ascites: Minimal fluid over the liver edge. Incidental note of small right pleural effusion. IMPRESSION: 1. Gallbladder is  markedly contracted, likely to be physiologic but nonspecific. No stones identified. 2. Normal flow velocities demonstrated in the portal and hepatic veins. Bidirectional flow is demonstrated in the main portal vein. Bidirectional flow may be due to Valsalva or early portal hypertension. 3. Right pleural effusion and small amount of ascites. Electronically Signed   By: Lucienne Capers M.D.   On: 10/21/2018 05:26    PERFORMANCE STATUS (ECOG) : 3 - Symptomatic, >50% confined to bed  Review of Systems  Eyes: Positive for pain.   As noted above. Otherwise, a complete review of systems is negative.  Physical Exam General: NAD, frail appearing, thin  Cardiovascular: irregular Pulmonary: clear ant fields Abdomen: soft, nontender, + bowel sounds Extremities: 1 + BLE edema Skin: no rashes Neurological: Weakness but otherwise nonfocal  IMPRESSION: Patient appears to be clinically improving. Breathing is better and patient no longer has congested cough.  Transaminases are downtrending.  PT evaluation noted.  Rehab is being recommended.  Patient is agreeable to going to rehab.  He would benefit from palliative care following in that location.  We will also plan to follow him in the clinic.  Note the patient has reversed his CODE STATUS during his hospitalization is now full code.  PLAN: Continue medical treatment Rehab when medically ready Recommend palliative care follow-up at rehab   Patient expressed understanding and was in agreement with this plan. He also understands that He can call clinic at any time with any questions, concerns, or complaints.     Time Total: 15 minutes  Visit consisted of counseling and education dealing with the complex and emotionally intense issues of symptom management and palliative care in the setting of serious and potentially life-threatening illness.Greater than 50%  of this time was spent counseling and coordinating care related to the above assessment  and plan.  Signed by: Altha Harm, PhD, NP-C [phone] (Work Cell)

## 2018-10-25 NOTE — Progress Notes (Signed)
Occupational Therapy Treatment Patient Details Name: Kirk [facility] MRN: [id number] DOB: [birth year] Today's Date: 10/25/2018    History of present illness Pt is a 77 y.o. male presenting to hospital 10/20/18 with SOB and weakness; recent fall with L hand skin tear.  Pt admitted with acute ischemic hepatitis, supratherapeutic INR, acute hyperkalemia, R sided heart failure with congestive hepatopathy, and a-fib.  PMH includes stage 3 lung CA on 2-3 L home O2, CHF, COPD, a-fib on warfarin, Barrett's esophagus, AICD, aortic aneurysm, expressive aphasia, CVA, AVR, CTR, h/o R femur fx surgery.   OT comments  Pt seen for OT treatment this date. Pt presented in recliner eating breakfast but eager for OT session. Pt HR ranged from 111-128 while in recliner. Pt actively listened to education regarding energy conservation strategies asking questions and he was knowledgeable about which  ADL tasks utilized lots of his energy and made him SOB. Pt educated in and handout/catalog provided for energy conservation strategies including pacing, taking breaks, using AE equipment for LB dressing, and task prioritization. Pt verbalized understanding of all education/training provided this date.  Upon hospital discharge, OT continues to recommend pt discharge to SNF.  Follow Up Recommendations  SNF    Equipment Recommendations  None recommended by OT    Recommendations for Other Services      Precautions / Restrictions Precautions Precautions: Fall Precaution Comments: R chest port Restrictions Weight Bearing Restrictions: No       Mobility Bed Mobility                  Transfers                      Balance Overall balance assessment: Modified Independent Sitting-balance support: No upper extremity supported;Feet supported Sitting balance-Leahy Scale: Good                                     ADL either performed or assessed with clinical judgement   ADL                                          General ADL Comments: Pt sittiing in recliner at start of session. Eating breakfast independently. Able to manipulate all breakfast items and utensils on tray.      Vision       Perception     Praxis      Cognition                                                Exercises Other Exercises Other Exercises: Pt educated in and handout/catalog provided for energy conservation strategies including pacing, taking breaks, using AE equipment for LB dressing, and prioritizing tasks   Shoulder Instructions       General Comments HR ranged from 111-128 with patient sitting on recliner for session    Pertinent Vitals/ Pain       Pain Assessment: No/denies pain  Home Living  Prior Functioning/Environment              Frequency  Min 1X/week        Progress Toward Goals  OT Goals(current goals can now be found in the care plan section)  Progress towards OT goals: Progressing toward goals  Acute Rehab OT Goals Patient Stated Goal: to not get so SOB OT Goal Formulation: With patient Time For Goal Achievement: 11/08/18 Potential to Achieve Goals: Good  Plan Discharge plan remains appropriate;Frequency remains appropriate    Co-evaluation                 AM-PAC OT "6 Clicks" Daily Activity     Outcome Measure   Help from another person eating meals?: None Help from another person taking care of personal grooming?: None Help from another person toileting, which includes using toliet, bedpan, or urinal?: A Lot Help from another person bathing (including washing, rinsing, drying)?: A Lot Help from another person to put on and taking off regular upper body clothing?: A Little Help from another person to put on and taking off regular lower body clothing?: A Lot 6 Click Score: 17    End of Session    OT Visit Diagnosis: Other  abnormalities of gait and mobility (R26.89);Repeated falls (R29.6);Pain Pain - Right/Left: Right Pain - part of body: (buttocks)   Activity Tolerance Patient tolerated treatment well   Patient Left in chair;with call bell/phone within reach;with chair alarm set   Nurse Communication          Time: 2111-7356 OT Time Calculation (min): 20 min  Charges:    Jadene Pierini OTS   10/25/2018, 9:10 AM

## 2018-10-26 DIAGNOSIS — C3490 Malignant neoplasm of unspecified part of unspecified bronchus or lung: Secondary | ICD-10-CM

## 2018-10-26 DIAGNOSIS — D701 Agranulocytosis secondary to cancer chemotherapy: Secondary | ICD-10-CM

## 2018-10-26 DIAGNOSIS — T451X5A Adverse effect of antineoplastic and immunosuppressive drugs, initial encounter: Secondary | ICD-10-CM

## 2018-10-26 DIAGNOSIS — I959 Hypotension, unspecified: Secondary | ICD-10-CM

## 2018-10-26 DIAGNOSIS — D696 Thrombocytopenia, unspecified: Secondary | ICD-10-CM

## 2018-10-26 DIAGNOSIS — Z87891 Personal history of nicotine dependence: Secondary | ICD-10-CM

## 2018-10-26 LAB — CBC WITH DIFFERENTIAL/PLATELET
Abs Immature Granulocytes: 0 10*3/uL (ref 0.00–0.07)
BASOS PCT: 0 %
Basophils Absolute: 0 10*3/uL (ref 0.0–0.1)
EOS ABS: 0 10*3/uL (ref 0.0–0.5)
EOS PCT: 0 %
HCT: 27 % — ABNORMAL LOW (ref 39.0–52.0)
Hemoglobin: 9.1 g/dL — ABNORMAL LOW (ref 13.0–17.0)
Immature Granulocytes: 0 %
Lymphocytes Relative: 6 %
Lymphs Abs: 0 10*3/uL — ABNORMAL LOW (ref 0.7–4.0)
MCH: 31.3 pg (ref 26.0–34.0)
MCHC: 33.7 g/dL (ref 30.0–36.0)
MCV: 92.8 fL (ref 80.0–100.0)
MONO ABS: 0.1 10*3/uL (ref 0.1–1.0)
Monocytes Relative: 13 %
Neutro Abs: 0.5 10*3/uL — ABNORMAL LOW (ref 1.7–7.7)
Neutrophils Relative %: 81 %
Platelets: 83 10*3/uL — ABNORMAL LOW (ref 150–400)
RBC: 2.91 MIL/uL — AB (ref 4.22–5.81)
RDW: 16.3 % — ABNORMAL HIGH (ref 11.5–15.5)
WBC: 0.6 10*3/uL — CL (ref 4.0–10.5)
nRBC: 0 % (ref 0.0–0.2)

## 2018-10-26 LAB — CBC
HCT: 26.7 % — ABNORMAL LOW (ref 39.0–52.0)
Hemoglobin: 8.9 g/dL — ABNORMAL LOW (ref 13.0–17.0)
MCH: 30.6 pg (ref 26.0–34.0)
MCHC: 33.3 g/dL (ref 30.0–36.0)
MCV: 91.8 fL (ref 80.0–100.0)
PLATELETS: 79 10*3/uL — AB (ref 150–400)
RBC: 2.91 MIL/uL — AB (ref 4.22–5.81)
RDW: 16.4 % — ABNORMAL HIGH (ref 11.5–15.5)
WBC: 0.8 10*3/uL — CL (ref 4.0–10.5)

## 2018-10-26 LAB — COMPREHENSIVE METABOLIC PANEL
ALK PHOS: 127 U/L — AB (ref 38–126)
ALT: 728 U/L — AB (ref 0–44)
AST: 150 U/L — ABNORMAL HIGH (ref 15–41)
Albumin: 2.6 g/dL — ABNORMAL LOW (ref 3.5–5.0)
Anion gap: 6 (ref 5–15)
BUN: 37 mg/dL — ABNORMAL HIGH (ref 8–23)
CALCIUM: 8.5 mg/dL — AB (ref 8.9–10.3)
CO2: 32 mmol/L (ref 22–32)
CREATININE: 0.9 mg/dL (ref 0.61–1.24)
Chloride: 97 mmol/L — ABNORMAL LOW (ref 98–111)
GFR calc Af Amer: >60 mL/min (ref >60)
Glucose, Bld: 128 mg/dL — ABNORMAL HIGH (ref 70–99)
Potassium: 4.5 mmol/L (ref 3.5–5.1)
Sodium: 135 mmol/L (ref 135–145)
Total Bilirubin: 2.7 mg/dL — ABNORMAL HIGH (ref 0.3–1.2)
Total Protein: 5.3 g/dL — ABNORMAL LOW (ref 6.5–8.1)

## 2018-10-26 LAB — GLUCOSE, CAPILLARY
GLUCOSE-CAPILLARY: 100 mg/dL — AB (ref 70–99)
Glucose-Capillary: 131 mg/dL — ABNORMAL HIGH (ref 70–99)
Glucose-Capillary: 121 mg/dL — ABNORMAL HIGH (ref 70–99)

## 2018-10-26 LAB — PROTIME-INR
INR: 3.15
Prothrombin Time: 31.9 seconds — ABNORMAL HIGH (ref 11.4–15.2)

## 2018-10-26 LAB — CALCIUM, IONIZED: CALCIUM, IONIZED, SERUM: 4.5 mg/dL (ref 4.5–5.6)

## 2018-10-26 MED ORDER — METOPROLOL TARTRATE 50 MG PO TABS: 50.0000 mg | ORAL_TABLET | Freq: Two times a day (BID) | ORAL | Status: DC

## 2018-10-26 MED ORDER — METOPROLOL TARTRATE 25 MG PO TABS: 25.0000 mg | ORAL_TABLET | Freq: Two times a day (BID) | ORAL | Status: DC

## 2018-10-26 MED ORDER — METOPROLOL TARTRATE 50 MG PO TABS
50.0000 mg | ORAL_TABLET | Freq: Two times a day (BID) | ORAL | Status: DC
  Administered 2018-10-26 – 2018-10-30 (×6): 50 mg via ORAL
  Filled 2018-10-26 (×7): qty 1

## 2018-10-26 MED ORDER — METOPROLOL TARTRATE 5 MG/5ML IV SOLN
5.0000 mg | Freq: Four times a day (QID) | INTRAVENOUS | Status: DC | PRN
  Administered 2018-10-26: 5 mg via INTRAVENOUS
  Filled 2018-10-26: qty 5

## 2018-10-26 MED ORDER — DILTIAZEM HCL 30 MG PO TABS
30.0000 mg | ORAL_TABLET | Freq: Three times a day (TID) | ORAL | Status: DC
  Administered 2018-10-27 – 2018-10-29 (×5): 30 mg via ORAL
  Filled 2018-10-26 (×6): qty 1

## 2018-10-26 MED ORDER — CIPROFLOXACIN HCL 500 MG PO TABS
500.0000 mg | ORAL_TABLET | Freq: Every day | ORAL | Status: DC
  Administered 2018-10-26 – 2018-10-27 (×2): 500 mg via ORAL
  Filled 2018-10-26 (×2): qty 1

## 2018-10-26 NOTE — Progress Notes (Signed)
ANTICOAGULATION CONSULT NOTE - Initial Consult  Pharmacy Consult for Warfarin Indication: atrial fibrillation  Allergies  Allergen Reactions  . Prednisone Other (See Comments)    Reaction: "organs shutting down"    Patient Measurements: Height: 5\' 3"  (160 cm) Weight: 167 lb 5.3 oz (75.9 kg) IBW/kg (Calculated) : 56.9  Vital Signs: Temp: 98.6 F (37 C) (11/28 0610) BP: 101/79 (11/28 0610) Pulse Rate: 152 (11/28 0610)  Labs: Recent Labs    10/23/18 1719 10/23/18 2259 10/24/18 0511 10/25/18 0553 10/25/18 0734 10/26/18 0513  HGB  --   --  9.0*  --   --  8.9*  HCT  --   --  27.4*  --   --  26.7*  PLT  --   --  98*  --   --  79*  APTT 33 32 33  --   --   --   LABPROT  --   --  41.0*  --  38.9* 31.9*  INR  --   --  4.36*  --  4.14* 3.15  CREATININE  --   --  1.10 1.00  --  0.90    Estimated Creatinine Clearance: 62.7 mL/min (by C-G formula based on SCr of 0.9 mg/dL).   Medical History: Past Medical History:  Diagnosis Date  . AICD (automatic cardioverter/defibrillator) present   . Aortic valve regurgitation   . Atrial fibrillation ([place])   . Barrett's esophagus   . Cancer ([place])    sate III lung  . CHF (congestive heart failure) ([place])   . COPD (chronic obstructive pulmonary disease) ([place])   . Diverticulosis   . Dysrhythmia   . GERD (gastroesophageal reflux disease)   . Gout   . Headache    aura only  . Heart murmur   . Hematuria   . Hernia of abdominal cavity   . History of prosthetic heart valve   . Hyperlipidemia   . Hypertension   . Nodular prostate with urinary obstruction   . Presence of permanent cardiac pacemaker   . Renal insufficiency   . Substance abuse ([facility])    alcohol    Medications:  Scheduled:  . finasteride  5 mg Oral Daily  . guaiFENesin  600 mg Oral BID  . insulin aspart  0-5 Units Subcutaneous QHS  . insulin aspart  0-9 Units Subcutaneous TID WC  . metoprolol succinate  37.5 mg Oral Daily  . pantoprazole  40 mg Oral Daily  .  tiotropium  18 mcg Inhalation Daily   Infusions:   PRN: bisacodyl, guaiFENesin-dextromethorphan, senna-docusate  Assessment: 77 year old male with hx of Afib, presented with SOB and weakness. Transaminasemia holding amiodarone. INR elevated but trending down.Home regimen is warfarin 3 mg daily.   Date INR Warfarin Dose  11/23 7.26 None- vitamin K 5 mg x1   11/24 5.13 None  11/24 4.67 None  11/24 4.19 None  11/25 3.95 Hold  11/26 4.36 Hold   11/27 4.14 Hold  11/28 3.15 Hold    Goal of Therapy:  INR 2-3 Monitor platelets by anticoagulation protocol: Yes   Plan:  Will hold warfarin until INR is < 3. Monitor LFTs - trending down. Daily INR and CBC will be ordered.   Laural Benes, PharmD, BCPS Clinical Pharmacist  10/26/2018,7:30 AM

## 2018-10-26 NOTE — Clinical Social Work Note (Signed)
CSW presented bed offers, and [facility] can accept patient once he is medically ready for discharge and orders have been received.  Jones Broom. [place], MSW, [place]  10/26/2018 12:16 PM

## 2018-10-26 NOTE — Progress Notes (Signed)
CRITICAL VALUE ALERT  Critical Value:  WBC's 0.6  Date & Time Notied:  10/26/18 1200  Provider Notified: Dr Estanislado Pandy  Orders Received/Actions taken: Per MD Dr Tasia Catchings saw pt, continue to monitor.

## 2018-10-26 NOTE — Progress Notes (Signed)
[facility] [employer]    MR#:  [id number]  DATE OF BIRTH:  1941/06/13  SUBJECTIVE:  CHIEF COMPLAINT:   Chief Complaint  Patient presents with  . Shortness of Breath  . Weakness  Patient seen and evaluated today Had an episode of rapid heart rate last night with heart rate going up to 130 bpm Currently in A. fib Comfortable on oxygen via nasal cannula at 2 L No cough No chest pain No nausea or vomiting   REVIEW OF SYSTEMS:    ROS  CONSTITUTIONAL: No documented fever. Has fatigue, weakness. No weight gain, no weight loss.  EYES: No blurry or double vision.  ENT: No tinnitus. No postnasal drip. No redness of the oropharynx.  RESPIRATORY: Has decreased cough, no wheeze, no hemoptysis.  Mild dyspnea on exertion.  CARDIOVASCULAR: No chest pain. No orthopnea.  Has palpitations. No syncope.  GASTROINTESTINAL: No nausea, no vomiting or diarrhea. No abdominal pain. No melena or hematochezia.  GENITOURINARY: No dysuria or hematuria.  ENDOCRINE: No polyuria or nocturia. No heat or cold intolerance.  HEMATOLOGY: No anemia. No bruising. No bleeding.  INTEGUMENTARY: No rashes. No lesions.  MUSCULOSKELETAL: No arthritis. swelling lower extremities. No gout.  NEUROLOGIC: No numbness, tingling, or ataxia. No seizure-type activity.  PSYCHIATRIC: No anxiety. No insomnia. No ADD.   DRUG ALLERGIES:   Allergies  Allergen Reactions  . Prednisone Other (See Comments)    Reaction: "organs shutting down"    VITALS:  Blood pressure 114/64, pulse (!) 132, temperature 97.6 F (36.4 C), temperature source Oral, resp. rate 20, height 5\' 3"  (1.6 m), weight 75.9 kg, SpO2 100 %.  PHYSICAL EXAMINATION:   Physical Exam  GENERAL:  77 y.o.-year-old patient lying in the bed with no acute distress.  EYES: Pupils equal, round, reactive to light and accommodation. No scleral icterus. Extraocular muscles intact.  HEENT: Head atraumatic,  normocephalic. Oropharynx and nasopharynx clear.  NECK:  Supple, no jugular venous distention. No thyroid enlargement, no tenderness.  LUNGS : Improved breath sounds bilaterally, bibasal basal crepitations heard. No use of accessory muscles of respiration.  CARDIOVASCULAR: S1, S2 irregular. No murmurs, rubs, or gallops.  ABDOMEN: Soft, nontender, nondistended. Bowel sounds present. No organomegaly or mass.  EXTREMITIES: No cyanosis, clubbing  1+ edema b/l.    NEUROLOGIC: Cranial nerves II through XII are intact. No focal Motor or sensory deficits b/l.   PSYCHIATRIC: The patient is alert and oriented x 3.  SKIN: No obvious rash, lesion, or ulcer.   LABORATORY PANEL:   CBC Recent Labs  Lab 10/26/18 0513  WBC 0.8*  HGB 8.9*  HCT 26.7*  PLT 79*   ------------------------------------------------------------------------------------------------------------------ Chemistries  Recent Labs  Lab 10/23/18 0527  10/26/18 0513  NA 134*   < > 135  K 4.7   < > 4.5  CL 94*   < > 97*  CO2 30   < > 32  GLUCOSE 130*   < > 128*  BUN 49*   < > 37*  CREATININE 1.33*   < > 0.90  CALCIUM 7.4*   < > 8.5*  MG 1.8  --   --   AST 1,461*   < > 150*  ALT 1,934*   < > 728*  ALKPHOS 134*   < > 127*  BILITOT 1.9*   < > 2.7*   < > = values in this interval not displayed.   ------------------------------------------------------------------------------------------------------------------  Cardiac Enzymes Recent Labs  Lab  10/21/18 0755  TROPONINI 0.08*   ------------------------------------------------------------------------------------------------------------------  RADIOLOGY:  No results found.   ASSESSMENT AND PLAN:   77 year old male patient with history of atrial fibrillation, aortic valve regurgitation, Barrett's esophagus, stage III lung cancer, chronic congestive heart failure, COPD, GERD, gout, hyperlipidemia, hypertension currently in ICU  -Acute ischemic hepatitis resolving Liver  enzymes trending down smoothly Gastroenterology follow-up appreciated Daily LFTs Avoid hepatotoxic drugs  -Neutropenia From morning labs today Will repeat CBC with differential count Discussed with hematology and will get their follow-up once the repeat CBCs resulted  -Supratherapeutic INR secondary to liver failure  Currently INR is greater than 4 INR goal is 2-3 Pharmacy follow-up for resuming Coumadin dosing once INR is below 3  -Acute hyperkalemia resolved  -Right-sided heart failure with congestive hepatopathy with history of aortic valve replacement Medical management continuing  -Acute renal failure on chronic kidney disease stage III Appreciate nephrology evaluation Monitor renal function  -Stage III lung cancer Status post oncology evaluation Further chemotherapy deferred in view of poor cardiopulmonary status by oncology  -Atrial fibrillation with rapid rate Status post cardiology evaluation Amiodarone on hold due to hepatocellular failure Increase metoprolol to 50 mg every 12 hourly Maintain INR between 2 and 3 No further intervention recommended  -Pleural effusion/fluid overload improving Hold off on IV fluids  Mucinex for expectoration PRN Robitussin for cough  -Hepatitis A positive Recommended and counseled good hand and oral hygiene Supportive care  -Ambulatory dysfunction and gait instability Status post physical therapy evaluation SNF placement once clinically stable versus home with home health services  All the records are reviewed and case discussed with Care Management/Social Worker. Management plans discussed with the patient, family and they are in agreement.  CODE STATUS: Full code  DVT Prophylaxis: SCDs  TOTAL TIME TAKING CARE OF THIS PATIENT: 34 minutes.   POSSIBLE D/C IN 2 to 3 DAYS, DEPENDING ON CLINICAL CONDITION.  Saundra Shelling M.D on 10/26/2018 at 10:11 AM  Between 7am to 6pm - Pager - [phone]  After 6pm go to  [email or web address] - password [email or web address] [facility]  Office  [phone]  CC: Primary care physician; Guadalupe Maple, MD  Note: This dictation was prepared with Dragon dictation along with smaller phrase technology. Any transcriptional errors that result from this process are unintentional.

## 2018-10-26 NOTE — Progress Notes (Signed)
[facility] Gastroenterology Inpatient Progress Note  Subjective: Patient seen for f/u elevated liver enzymes, ischemic hepatitis, Acute hepatitis A infection. Patient alert and in no distress.  Objective: Vital signs in last 24 hours: Temp:  [97.5 F (36.4 C)-98.6 F (37 C)] 97.5 F (36.4 C) (11/28 1358) Pulse Rate:  [73-152] 104 (11/28 1400) Resp:  [18-20] 20 (11/28 1358) BP: (79-114)/(51-79) 83/51 (11/28 1400) SpO2:  [91 %-100 %] 100 % (11/28 1400) Weight:  [75.9 kg] 75.9 kg (11/28 0500) Blood pressure (!) 83/51, pulse (!) 104, temperature (!) 97.5 F (36.4 C), temperature source Oral, resp. rate 20, height 5\' 3"  (1.6 m), weight 75.9 kg, SpO2 100 %.    Intake/Output from previous day: 11/27 0701 - 11/28 0700 In: 360 [P.O.:360] Out: -   Intake/Output this shift: No intake/output data recorded.   General appearance:  Alert Resp:  CTA Cardio:  irreg irreg rate. No gallop. GI:  Soft, nt, nd. BS+ Extremities: No edema.    Lab Results: Results for orders placed or performed during the hospital encounter of 10/20/18 (from the past 24 hour(s))  Glucose, capillary     Status: None   Collection Time: 10/25/18  4:32 PM  Result Value Ref Range   Glucose-Capillary 94 70 - 99 mg/dL  Glucose, capillary     Status: Abnormal   Collection Time: 10/25/18  9:22 PM  Result Value Ref Range   Glucose-Capillary 114 (H) 70 - 99 mg/dL  Protime-INR     Status: Abnormal   Collection Time: 10/26/18  5:13 AM  Result Value Ref Range   Prothrombin Time 31.9 (H) 11.4 - 15.2 seconds   INR 3.15   CBC     Status: Abnormal   Collection Time: 10/26/18  5:13 AM  Result Value Ref Range   WBC 0.8 (LL) 4.0 - 10.5 K/uL   RBC 2.91 (L) 4.22 - 5.81 MIL/uL   Hemoglobin 8.9 (L) 13.0 - 17.0 g/dL   HCT 26.7 (L) 39.0 - 52.0 %   MCV 91.8 80.0 - 100.0 fL   MCH 30.6 26.0 - 34.0 pg   MCHC 33.3 30.0 - 36.0 g/dL   RDW 16.4 (H) 11.5 - 15.5 %   Platelets 79 (L) 150 - 400 K/uL  Comprehensive metabolic panel      Status: Abnormal   Collection Time: 10/26/18  5:13 AM  Result Value Ref Range   Sodium 135 135 - 145 mmol/L   Potassium 4.5 3.5 - 5.1 mmol/L   Chloride 97 (L) 98 - 111 mmol/L   CO2 32 22 - 32 mmol/L   Glucose, Bld 128 (H) 70 - 99 mg/dL   BUN 37 (H) 8 - 23 mg/dL   Creatinine, Ser 0.90 0.61 - 1.24 mg/dL   Calcium 8.5 (L) 8.9 - 10.3 mg/dL   Total Protein 5.3 (L) 6.5 - 8.1 g/dL   Albumin 2.6 (L) 3.5 - 5.0 g/dL   AST 150 (H) 15 - 41 U/L   ALT 728 (H) 0 - 44 U/L   Alkaline Phosphatase 127 (H) 38 - 126 U/L   Total Bilirubin 2.7 (H) 0.3 - 1.2 mg/dL   GFR calc non Af Amer >60 >60 mL/min   GFR calc Af Amer >60 >60 mL/min   Anion gap 6 5 - 15  Glucose, capillary     Status: Abnormal   Collection Time: 10/26/18  7:31 AM  Result Value Ref Range   Glucose-Capillary 100 (H) 70 - 99 mg/dL  CBC with Differential/Platelet  Status: Abnormal   Collection Time: 10/26/18  9:40 AM  Result Value Ref Range   WBC 0.6 (LL) 4.0 - 10.5 K/uL   RBC 2.91 (L) 4.22 - 5.81 MIL/uL   Hemoglobin 9.1 (L) 13.0 - 17.0 g/dL   HCT 27.0 (L) 39.0 - 52.0 %   MCV 92.8 80.0 - 100.0 fL   MCH 31.3 26.0 - 34.0 pg   MCHC 33.7 30.0 - 36.0 g/dL   RDW 16.3 (H) 11.5 - 15.5 %   Platelets 83 (L) 150 - 400 K/uL   nRBC 0.0 0.0 - 0.2 %   Neutrophils Relative % 81 %   Neutro Abs 0.5 (L) 1.7 - 7.7 K/uL   Lymphocytes Relative 6 %   Lymphs Abs 0.0 (L) 0.7 - 4.0 K/uL   Monocytes Relative 13 %   Monocytes Absolute 0.1 0.1 - 1.0 K/uL   Eosinophils Relative 0 %   Eosinophils Absolute 0.0 0.0 - 0.5 K/uL   Basophils Relative 0 %   Basophils Absolute 0.0 0.0 - 0.1 K/uL   WBC Morphology MORPHOLOGY UNREMARKABLE    Immature Granulocytes 0 %   Abs Immature Granulocytes 0.00 0.00 - 0.07 K/uL   Giant PLTs PRESENT   Glucose, capillary     Status: Abnormal   Collection Time: 10/26/18 12:17 PM  Result Value Ref Range   Glucose-Capillary 131 (H) 70 - 99 mg/dL     Recent Labs    10/24/18 0511 10/26/18 0513 10/26/18 0940  WBC 8.5  0.8* 0.6*  HGB 9.0* 8.9* 9.1*  HCT 27.4* 26.7* 27.0*  PLT 98* 79* 83*   BMET Recent Labs    10/24/18 0511 10/25/18 0553 10/26/18 0513  NA 134* 133* 135  K 4.8 4.6 4.5  CL 94* 93* 97*  CO2 32 31 32  GLUCOSE 116* 113* 128*  BUN 48* 44* 37*  CREATININE 1.10 1.00 0.90  CALCIUM 8.0* 8.3* 8.5*   LFT Recent Labs    10/26/18 0513  PROT 5.3*  ALBUMIN 2.6*  AST 150*  ALT 728*  ALKPHOS 127*  BILITOT 2.7*   PT/INR Recent Labs    10/25/18 0734 10/26/18 0513  LABPROT 38.9* 31.9*  INR 4.14* 3.15   Hepatitis Panel No results for input(s): HEPBSAG, HCVAB, HEPAIGM, HEPBIGM in the last 72 hours. C-Diff No results for input(s): CDIFFTOX in the last 72 hours. No results for input(s): CDIFFPCR in the last 72 hours.   Studies/Results: No results found.  Scheduled Inpatient Medications:   . ciprofloxacin  500 mg Oral Daily  . finasteride  5 mg Oral Daily  . guaiFENesin  600 mg Oral BID  . insulin aspart  0-5 Units Subcutaneous QHS  . insulin aspart  0-9 Units Subcutaneous TID WC  . metoprolol tartrate  50 mg Oral BID  . pantoprazole  40 mg Oral Daily  . tiotropium  18 mcg Inhalation Daily    Continuous Inpatient Infusions:    PRN Inpatient Medications:  bisacodyl, guaiFENesin-dextromethorphan, metoprolol tartrate, senna-docusate  Miscellaneous: N/A  Assessment:  1. Acute hepatitis A - Stable 2. Ischemic hepatitis - Stable, liver enzymes trending downward. 3. Increased INR secondary to coumadin toxicity. There is no liver failure evident.  4. Atrial fibrillation - per medicine service, stable.   Plan:  1. Discussed at length with patient's wife regarding need for Hep A screening of all family members in close contact with patient over the past month.  2. Continue current treatment as outlined. 3. As liver enzymes and mental status are good,  will sign off. Call back if we can help. 4. Follow up in office with Dr. Bonna Gains. [facility]  Kirk Jones K. Alice Reichert,  M.D. 10/26/2018, 2:22 PM

## 2018-10-26 NOTE — Progress Notes (Signed)
Hematology/Oncology Progress Note [facility] [phone] [phone]  Patient Care Team: Guadalupe Maple, MD as PCP - General (Family Medicine) Murlean Iba, MD (Internal Medicine) Isaias Cowman, MD as Consulting Physician (Cardiology) Christene Lye, MD (General Surgery) Telford Nab, RN as Registered Nurse Knox Royalty, RN as [place] Management   Name of the patient: Kirk Jones  [id number]  05/07/1941  Date of visit: 10/26/18   INTERVAL HISTORY-    Seen and evaluated at bedside.  Sitting in chair. Breathing comfortably on oxygen via nasal cannula, 2 L. Denies any chest pain, dysuria, fever or chills.  No cough.    Review of systems- Review of Systems  Constitutional: Positive for fatigue. Negative for chills and fever.  HENT:   Negative for sore throat.   Respiratory: Negative for chest tightness and cough.   Gastrointestinal: Negative for abdominal pain.  Endocrine: Negative for hot flashes.  Genitourinary: Positive for difficulty urinating and dysuria.   Neurological: Negative for headaches.  Psychiatric/Behavioral: Negative for confusion.    Allergies  Allergen Reactions  . Prednisone Other (See Comments)    Reaction: "organs shutting down"    Patient Active Problem List   Diagnosis Date Noted  . Unstageable pressure ulcer of sacral region ([place]) 10/25/2018  . Generalized weakness   . Acute on chronic congestive heart failure ([place])   . Transaminasemia 10/21/2018  . Hyperkalemia   . Supratherapeutic INR   . Palliative care encounter   . Syncope 10/12/2018  . Pressure injury of skin 10/12/2018  . Squamous cell carcinoma of right lung ([place]) 09/16/2018  . Lung mass 08/22/2018  . Exertional shortness of breath 06/07/2018  . Peripheral edema 06/07/2018  . Allergic rhinitis 02/12/2018  . Advanced care planning/counseling discussion 10/19/2017  . Expressive aphasia  06/13/2017  . Hx of adenomatous colonic polyps 02/18/2017  . Bilateral cataracts 08/18/2016  . CAD (coronary artery disease) 10/06/2015  . Pacemaker 10/06/2015  . Automatic implantable cardioverter-defibrillator in situ 10/06/2015  . Nodular prostate with urinary obstruction 10/06/2015  . Senile purpura ([place]) 10/06/2015  . Barrett's esophagus 05/28/2015  . Hypertension 05/28/2015  . COPD (chronic obstructive pulmonary disease) ([place]) 05/28/2015  . Hyperlipidemia 05/28/2015  . Diverticulosis 05/28/2015  . CKD (chronic kidney disease), stage III ([place]) 05/28/2015  . Gout 05/28/2015  . History of prosthetic heart valve 05/13/2015  . Atrial fibrillation ([place]) 05/13/2015  . CVA (cerebral vascular accident) ([place]) 07/05/2014  . Systolic congestive heart failure ([place]) 07/05/2014  . History of TIA (transient ischemic attack) 02/12/2013  . Warfarin anticoagulation 02/12/2013     Past Medical History:  Diagnosis Date  . AICD (automatic cardioverter/defibrillator) present   . Aortic valve regurgitation   . Atrial fibrillation ([place])   . Barrett's esophagus   . Cancer ([place])    sate III lung  . CHF (congestive heart failure) ([place])   . COPD (chronic obstructive pulmonary disease) ([place])   . Diverticulosis   . Dysrhythmia   . GERD (gastroesophageal reflux disease)   . Gout   . Headache    aura only  . Heart murmur   . Hematuria   . Hernia of abdominal cavity   . History of prosthetic heart valve   . Hyperlipidemia   . Hypertension   . Nodular prostate with urinary obstruction   . Presence of permanent cardiac pacemaker   . Renal insufficiency   . Substance abuse ([place])    alcohol     Past Surgical History:  Procedure Laterality Date  . CARDIAC VALVE REPLACEMENT N/A 2004  . CARPAL TUNNEL RELEASE Right 2015  . CATARACT EXTRACTION Right 2017  . COLONOSCOPY WITH PROPOFOL N/A 2014  . CORONARY ANGIOPLASTY     stints x2  . EYE SURGERY     cross eyed  . FOOT SURGERY Bilateral     [place]  . FRACTURE SURGERY Right    [place]  . INGUINAL HERNIA REPAIR Right 09/16/2017   Procedure: HERNIA REPAIR INGUINAL ADULT;  Surgeon: Christene Lye, MD;  Location: [facility] ORS;  Service: General;  Laterality: Right;  . INSERT / REPLACE / [place]     ICD  . PACEMAKER IMPLANT Left 2013   and ICD  . PORTA CATH INSERTION N/A 09/21/2018   Procedure: PORTA CATH INSERTION;  Surgeon: Algernon Huxley, MD;  Location: [facility];  Service: Cardiovascular;  Laterality: N/A;  . VASECTOMY      Social History   Socioeconomic History  . Marital status: Married    Spouse name: Belenda Cruise   . Number of children: 5  . Years of education: Not on file  . Highest education level: Not on file  Occupational History  . Not on file  Social Needs  . Financial resource strain: Not hard at all  . Food insecurity:    Worry: Never true    Inability: Never true  . Transportation needs:    Medical: No    Non-medical: No  Tobacco Use  . Smoking status: Former Smoker    Types: Cigarettes    Last attempt to quit: 04/24/1991    Years since quitting: 27.5  . Smokeless tobacco: Never Used  Substance and Sexual Activity  . Alcohol use: Yes    Alcohol/week: 2.0 standard drinks    Types: 2 Shots of liquor per week    Comment: quit December 2015/restarted-2 drinks per night  . Drug use: No  . Sexual activity: Never  Lifestyle  . Physical activity:    Days per week: 0 days    Minutes per session: 0 min  . Stress: Not at all  Relationships  . Social connections:    Talks on phone: Once a week    Gets together: Once a week    Attends religious service: More than 4 times per year    Active member of club or organization: No    Attends meetings of clubs or organizations: Never    Relationship status: Married  . Intimate partner violence:    Fear of current or ex partner: No    Emotionally abused: No    Physically abused: No    Forced sexual activity: No  Other Topics Concern    . Not on file  Social History Narrative  . Not on file     Family History  Problem Relation Age of Onset  . Heart attack Mother   . Diabetes Maternal Grandmother   . Lung cancer Sister      Current Facility-Administered Medications:  .  bisacodyl (DULCOLAX) EC tablet 5 mg, 5 mg, Oral, Daily PRN, Arta Silence, MD, 5 mg at 10/24/18 0848 .  ciprofloxacin (CIPRO) tablet 500 mg, 500 mg, Oral, Daily, Pyreddy, Pavan, MD, 500 mg at 10/26/18 1242 .  finasteride (PROSCAR) tablet 5 mg, 5 mg, Oral, Daily, Jodell Cipro, Prasanna, MD, 5 mg at 10/26/18 0931 .  guaiFENesin (MUCINEX) 12 hr tablet 600 mg, 600 mg, Oral, BID, Pyreddy, Pavan, MD, 600 mg at 10/26/18 0931 .  guaiFENesin-dextromethorphan (ROBITUSSIN DM) 100-10  MG/5ML syrup 5 mL, 5 mL, Oral, Q4H PRN, Pyreddy, Pavan, MD .  insulin aspart (novoLOG) injection 0-5 Units, 0-5 Units, Subcutaneous, QHS, Sridharan, Prasanna, MD .  insulin aspart (novoLOG) injection 0-9 Units, 0-9 Units, Subcutaneous, TID WC, Arta Silence, MD, 1 Units at 10/26/18 1242 .  metoprolol tartrate (LOPRESSOR) injection 5 mg, 5 mg, Intravenous, Q6H PRN, Pyreddy, Pavan, MD, 5 mg at 10/26/18 1249 .  metoprolol tartrate (LOPRESSOR) tablet 50 mg, 50 mg, Oral, BID, Pyreddy, Pavan, MD, 50 mg at 10/26/18 0931 .  pantoprazole (PROTONIX) EC tablet 40 mg, 40 mg, Oral, Daily, Jodell Cipro, Prasanna, MD, 40 mg at 10/26/18 0931 .  senna-docusate (Senokot-S) tablet 1 tablet, 1 tablet, Oral, QHS PRN, Jodell Cipro, Prasanna, MD .  tiotropium (SPIRIVA) inhalation capsule ([facility] use ONLY) 18 mcg, 18 mcg, Inhalation, Daily, Pyreddy, Pavan, MD, 18 mcg at 10/26/18 0931  Facility-Administered Medications Ordered in Other Encounters:  .  0.9 %  sodium chloride infusion, , Intravenous, Continuous, Ascencion Dike, [place]   Physical exam:  Vitals:   10/26/18 0922 10/26/18 1242 10/26/18 1358 10/26/18 1400  BP: 114/64 103/76 (!) 79/60 (!) 83/51  Pulse: (!) 132 (!) 117 73 (!) 104  Resp: 20  20    Temp: 97.6 F (36.4 C)  (!) 97.5 F (36.4 C)   TempSrc: Oral  Oral   SpO2: 100%     Weight:      Height:       Physical Exam  Constitutional: He is oriented to person, place, and time. No distress.  HENT:  Head: Normocephalic and atraumatic.  Nose: Nose normal.  Mouth/Throat: Oropharynx is clear and moist. No oropharyngeal exudate.  Eyes: Pupils are equal, round, and reactive to light. EOM are normal. No scleral icterus.  Neck: Normal range of motion. Neck supple.  Cardiovascular:  No murmur heard. Tachycardia  Pulmonary/Chest: Effort normal. No respiratory distress. He has no rales. He exhibits no tenderness.  Decreased breath sound bilaterally.  Worse on the right side.  Abdominal: Soft. He exhibits no distension. There is no tenderness.  Musculoskeletal: Normal range of motion. He exhibits edema.  Neurological: He is alert and oriented to person, place, and time. No cranial nerve deficit.  Skin: Skin is warm and dry. He is not diaphoretic.  Psychiatric: Affect normal.       CMP Latest Ref Rng & Units 10/26/2018  Glucose 70 - 99 mg/dL 128(H)  BUN 8 - 23 mg/dL 37(H)  Creatinine 0.61 - 1.24 mg/dL 0.90  Sodium 135 - 145 mmol/L 135  Potassium 3.5 - 5.1 mmol/L 4.5  Chloride 98 - 111 mmol/L 97(L)  CO2 22 - 32 mmol/L 32  Calcium 8.9 - 10.3 mg/dL 8.5(L)  Total Protein 6.5 - 8.1 g/dL 5.3(L)  Total Bilirubin 0.3 - 1.2 mg/dL 2.7(H)  Alkaline Phos 38 - 126 U/L 127(H)  AST 15 - 41 U/L 150(H)  ALT 0 - 44 U/L 728(H)   CBC Latest Ref Rng & Units 10/26/2018  WBC 4.0 - 10.5 K/uL 0.6(LL)  Hemoglobin 13.0 - 17.0 g/dL 9.1(L)  Hematocrit 39.0 - 52.0 % 27.0(L)  Platelets 150 - 400 K/uL 83(L)   RADIOGRAPHIC STUDIES: I have personally reviewed the radiological images as listed and agreed with the findings in the report. Dg Chest 1 View  Result Date: 10/21/2018 CLINICAL DATA:  77 y/o  M; aspiration into the respiratory tract. EXAM: CHEST  1 VIEW COMPARISON:  10/21/2018 chest CT and  chest radiograph. FINDINGS: Stable right basilar opacity and right-sided pleural effusion. Interstitial  pulmonary edema. Right port catheter tip projects over mid SVC. Stable cardiac silhouette given projection and technique. Single lead AICD. Post median sternotomy with wires in alignment. Calcific aortic atherosclerosis. No acute osseous abnormality is evident. IMPRESSION: Stable right basilar opacity corresponding to mass on CT and right-sided pleural effusion. Interstitial pulmonary edema. Aortic Atherosclerosis (ICD10-I70.0). Electronically Signed   By: Kristine Garbe M.D.   On: 10/21/2018 20:23   Ct Head Wo Contrast  Result Date: 10/12/2018 CLINICAL DATA:  Acute onset of dizziness and fall. Hit head on headboard. Laceration at the right temple. Patient on Coumadin. Initial encounter. EXAM: CT HEAD WITHOUT CONTRAST TECHNIQUE: Contiguous axial images were obtained from the base of the skull through the vertex without intravenous contrast. COMPARISON:  CT of the head performed 09/04/2018 FINDINGS: Brain: No evidence of acute infarction, hemorrhage, hydrocephalus, extra-axial collection or mass lesion / mass effect. Prominence of the ventricles and sulci reflects mild to moderate cortical volume loss. Cerebellar atrophy is noted. Scattered periventricular and subcortical white matter change likely reflects small vessel ischemic microangiopathy. A small chronic lacunar infarct is noted at the left cerebellar hemisphere. The brainstem and fourth ventricle are within normal limits. The basal ganglia are unremarkable in appearance. The cerebral hemispheres demonstrate grossly normal gray-white differentiation. No mass effect or midline shift is seen. Vascular: No hyperdense vessel or unexpected calcification. Skull: There is no evidence of fracture; visualized osseous structures are unremarkable in appearance. Sinuses/Orbits: The visualized portions of the orbits are within normal limits. The paranasal  sinuses and mastoid air cells are well-aerated. Other: A prominent soft tissue laceration is noted anterior to the right ear, with soft tissue air extending below the right ear. IMPRESSION: 1. No evidence of traumatic intracranial injury or fracture. 2. Prominent soft tissue laceration anterior to the right ear, with soft tissue air extending below the right ear. 3. Mild to moderate cortical volume loss and scattered small vessel ischemic microangiopathy. 4. Small chronic lacunar infarct at the left cerebellar hemisphere. Electronically Signed   By: Garald Balding M.D.   On: 10/12/2018 00:18   Ct Chest Wo Contrast  Result Date: 10/21/2018 CLINICAL DATA:  Shortness of breath and weakness. EXAM: CT CHEST WITHOUT CONTRAST TECHNIQUE: Multidetector CT imaging of the chest was performed following the standard protocol without IV contrast. COMPARISON:  08/21/2018 FINDINGS: Cardiovascular: Evaluation of vascular structures is limited without IV contrast material. Cardiac pacemaker. Diffuse cardiac enlargement. No pericardial effusions. Coronary artery calcifications. Postoperative changes in the mediastinum. Calcification throughout the aorta. Mediastinum/Nodes: Moderate prominent lymph nodes throughout the mediastinum without change since prior study, likely reactive. Lungs/Pleura: Bilateral pleural effusions, greater on the right. Atelectasis in the lung bases. Known right lower lobe mass is visible but somewhat obscured by the effusion and atelectasis. Emphysematous changes in the lungs. No pneumothorax. Airways are patent. Upper Abdomen: Probable hepatic cirrhosis. Free fluid in the right upper quadrant likely represents ascites. Musculoskeletal: Degenerative changes in the spine. No destructive bone lesions. Postoperative sternotomy wires. IMPRESSION: Known mass in the right lung base. Cardiac enlargement. Bilateral pleural effusions with basilar atelectasis, greater on the right. Mild progression on the right  since previous study. Hepatic cirrhosis with upper abdominal ascites. Electronically Signed   By: Lucienne Capers M.D.   On: 10/21/2018 06:53   Dg Hand 2 View Left  Result Date: 10/12/2018 CLINICAL DATA:  Pt reports he had a fall last night and is now experiencing pain in his left hand. Pt hand appears swollen and red at this time EXAM:  LEFT HAND - 2 VIEW COMPARISON:  None. FINDINGS: IV tubing overlies the wrist. There are degenerative changes insert wrist, primarily along the radial aspect of the carpals and the first carpometacarpal joint. There is degenerative change at the second metacarpophalangeal joint. No acute fracture or subluxation. Atherosclerotic calcification of the small arteries. IMPRESSION: No evidence for acute  abnormality.  Degenerative changes. Electronically Signed   By: Nolon Nations M.D.   On: 10/12/2018 15:27   US Venous Img Upper Uni Left  Result Date: 10/15/2018 CLINICAL DATA:  Left arm swelling. Recent fall. Left cardiac ICD. Edema from the elbow to the wrist. EXAM: LEFT UPPER EXTREMITY VENOUS DOPPLER ULTRASOUND TECHNIQUE: Gray-scale sonography with graded compression, as well as color Doppler and duplex ultrasound were performed to evaluate the upper extremity deep venous system from the level of the subclavian vein and including the jugular, axillary, basilic, radial, ulnar and upper cephalic vein. Spectral Doppler was utilized to evaluate flow at rest and with distal augmentation maneuvers. COMPARISON:  Chest radiograph 10/11/2018 FINDINGS: Contralateral Subclavian Vein: Normal respiratory phasicity. Normal color Doppler flow in the right subclavian vein. No evidence for right subclavian thrombus. Internal Jugular Vein: No evidence of thrombus. Normal compressibility, respiratory phasicity and response to augmentation. Subclavian Vein: The left cardiac ICD lead is noted. There appears to be flow around the lead and left subclavian vein appears to be patent without  thrombus. Axillary Vein: No evidence of thrombus. Normal compressibility, respiratory phasicity and response to augmentation. Cephalic Vein: No evidence of thrombus. Normal compressibility, respiratory phasicity and response to augmentation. Basilic Vein: No evidence of thrombus. Normal compressibility, respiratory phasicity and response to augmentation. Brachial Veins: No evidence of thrombus. Normal compressibility, respiratory phasicity and response to augmentation. Radial Veins: No evidence of thrombus. Normal compressibility, respiratory phasicity and response to augmentation. Single radial vein is identified. Ulnar Veins: No evidence of thrombus. Normal compressibility, respiratory phasicity and response to augmentation. Single ulnar vein is identified. Venous Reflux:  None visualized. Other Findings:  Subcutaneous edema. IMPRESSION: No evidence of DVT within the left upper extremity. Limited evaluation of the left subclavian vein due to the cardiac ICD but no evidence for thrombus in this area. Electronically Signed   By: Markus Daft M.D.   On: 10/15/2018 14:58   Dg Chest Port 1 View  Result Date: 10/21/2018 CLINICAL DATA:  Shortness of breath. EXAM: PORTABLE CHEST 1 VIEW COMPARISON:  Chest x-rays dated 10/11/2018 and 08/10/2018. FINDINGS: Increased opacity at the RIGHT lung base. Probable mild atelectasis and/or small pleural effusion at the LEFT lung base. No pneumothorax seen. No acute or suspicious osseous finding. Stable cardiomegaly. RIGHT chest wall Port-A-Cath appears stable in position with tip at the level of the mid/lower SVC. LEFT chest wall pacemaker/ICD apparatus is stable in position. Median sternotomy wires appear intact and stable in alignment. IMPRESSION: 1. Increasing opacity at the RIGHT lung base. This is mostly related to the RIGHT lower lobe mass identified on recent chest CT (08/21/2018) and PET-CT (08/30/2018), likely with superimposed pleural effusion and/or atelectasis that has  slightly increased in the interval. 2. Stable cardiomegaly. 3. Probable mild atelectasis and/or small pleural effusion at the LEFT lung base. Electronically Signed   By: Franki Cabot M.D.   On: 10/21/2018 00:32   Dg Chest Port 1 View  Result Date: 10/12/2018 CLINICAL DATA:  Status post fall, with concern for chest injury. Initial encounter. EXAM: PORTABLE CHEST 1 VIEW COMPARISON:  CT of the chest performed 08/21/2018 FINDINGS: There is a persistent  small right-sided pleural effusion. The known right lower lobe mass is better characterized on prior CT. There is suggestion of underlying interstitial edema, which may be transient in nature. No pneumothorax is seen. The cardiomediastinal silhouette is borderline normal in size. The patient is status post median sternotomy. An AICD is noted overlying the left chest wall, with a single lead ending overlying the right ventricle. The patient's right chest port is noted ending about the mid SVC. No displaced rib fractures are seen. IMPRESSION: 1. Persistent small right-sided pleural effusion. Known right lower lobe mass is better characterized on prior CT. Suggestion of underlying interstitial edema, which may be transient in nature. 2. No displaced rib fracture seen. Electronically Signed   By: Garald Balding M.D.   On: 10/12/2018 00:22   US Liver Doppler  Result Date: 10/21/2018 CLINICAL DATA:  Abnormal transaminases. Patient undergoing chemotherapy and radiation for stage III lung cancer. EXAM: DUPLEX ULTRASOUND OF LIVER TECHNIQUE: Color and duplex Doppler ultrasound was performed to evaluate the hepatic in-flow and out-flow vessels. COMPARISON:  CT abdomen 09/25/2018 FINDINGS: Gallbladder: The gallbladder is not definitively identified. In the gallbladder fossa, there is a structure which is could represent a severely contracted gallbladder. No significant gallbladder lumen is visualized. No stones are identified. Murphy's sign is negative. Common bile duct:  Diameter: 4.1 mm, normal Liver: normal parenchymal echogenicity. Normal hepatic contour without nodularity. No focal lesion, mass or intrahepatic biliary ductal dilatation. Portal Vein Velocities Main:  29 cm/sec Right:  39 cm/sec Left:  116 cm/sec Main portal vein demonstrates bidirectional flow with intermittent hepatopetal and hepatofugal flow direction. Right and left portal veins demonstrate normal hepatopetal flow direction. Hepatic Vein Velocities Right:  32 cm/sec Middle:  56 cm/sec Left:  30 cm/sec Normal flow direction demonstrated in the hepatic veins. IVC: Present and patent with normal respiratory phasicity. Velocity measures 36 centimeters/second. Hepatic Artery Velocity:  130/32 cm/sec Splenic Vein Velocity:  48 cm/sec Varices: Not identified. Ascites: Minimal fluid over the liver edge. Incidental note of small right pleural effusion. IMPRESSION: 1. Gallbladder is markedly contracted, likely to be physiologic but nonspecific. No stones identified. 2. Normal flow velocities demonstrated in the portal and hepatic veins. Bidirectional flow is demonstrated in the main portal vein. Bidirectional flow may be due to Valsalva or early portal hypertension. 3. Right pleural effusion and small amount of ascites. Electronically Signed   By: Lucienne Capers M.D.   On: 10/21/2018 05:26   US Abdomen Limited Ruq  Result Date: 10/21/2018 CLINICAL DATA:  Abnormal transaminases. Patient undergoing chemotherapy and radiation for stage III lung cancer. EXAM: DUPLEX ULTRASOUND OF LIVER TECHNIQUE: Color and duplex Doppler ultrasound was performed to evaluate the hepatic in-flow and out-flow vessels. COMPARISON:  CT abdomen 09/25/2018 FINDINGS: Gallbladder: The gallbladder is not definitively identified. In the gallbladder fossa, there is a structure which is could represent a severely contracted gallbladder. No significant gallbladder lumen is visualized. No stones are identified. Murphy's sign is negative. Common  bile duct: Diameter: 4.1 mm, normal Liver: normal parenchymal echogenicity. Normal hepatic contour without nodularity. No focal lesion, mass or intrahepatic biliary ductal dilatation. Portal Vein Velocities Main:  29 cm/sec Right:  39 cm/sec Left:  116 cm/sec Main portal vein demonstrates bidirectional flow with intermittent hepatopetal and hepatofugal flow direction. Right and left portal veins demonstrate normal hepatopetal flow direction. Hepatic Vein Velocities Right:  32 cm/sec Middle:  56 cm/sec Left:  30 cm/sec Normal flow direction demonstrated in the hepatic veins. IVC: Present and patent with normal  respiratory phasicity. Velocity measures 36 centimeters/second. Hepatic Artery Velocity:  130/32 cm/sec Splenic Vein Velocity:  48 cm/sec Varices: Not identified. Ascites: Minimal fluid over the liver edge. Incidental note of small right pleural effusion. IMPRESSION: 1. Gallbladder is markedly contracted, likely to be physiologic but nonspecific. No stones identified. 2. Normal flow velocities demonstrated in the portal and hepatic veins. Bidirectional flow is demonstrated in the main portal vein. Bidirectional flow may be due to Valsalva or early portal hypertension. 3. Right pleural effusion and small amount of ascites. Electronically Signed   By: Lucienne Capers M.D.   On: 10/21/2018 05:26    Assessment and plan-  Patient is a 77 y.o. male with multiple comorbidities, recent diagnosis of stage III lung cancer, recently on concurrent chemo and radiation. Last chemotherapy believed to be on 10/19/2018 currently admitted due to hyponatremia, hypotension, ischemia hepatitis, CHF  #Neutropenia, with ANC of 0.5 Acute drop of WBC/ANC today. No signs of active infection.  No fever. Recommend starting neutropenia prophylaxis with Cipro 500 mg. Smear reviewed, morphology unremarkable.  Giant platelets.  Likely due to bone marrow suppression from recent chemotherapy and radiation, as well as possible bone  marrow ischemia from hypotension. Continue supportive care. If he spikes fever, recommend starting IV broad-spectrum antibiotics, will also consider using G-CSF support too  Monitor CBC with differential daily. #Ischemic hepatitis, transaminitis trending down. #Anemia, hemoglobin stable. #Thrombocytopenia, platelet count stable.   Discussed about the plan with Dr. Estanislado Pandy. Thank you for allowing me to participate in the care of this patient.  Total face to face encounter time for this patient visit was 25 min. >50% of the time was  spent in counseling and coordination of care.   Earlie Server, MD, PhD Hematology Oncology [facility] Pager- [phone] 10/26/2018

## 2018-10-27 LAB — COMPREHENSIVE METABOLIC PANEL
ALBUMIN: 2.6 g/dL — AB (ref 3.5–5.0)
ALT: 529 U/L — ABNORMAL HIGH (ref 0–44)
ANION GAP: 9 (ref 5–15)
AST: 88 U/L — ABNORMAL HIGH (ref 15–41)
Alkaline Phosphatase: 113 U/L (ref 38–126)
BUN: 32 mg/dL — ABNORMAL HIGH (ref 8–23)
CO2: 30 mmol/L (ref 22–32)
Calcium: 8.2 mg/dL — ABNORMAL LOW (ref 8.9–10.3)
Chloride: 94 mmol/L — ABNORMAL LOW (ref 98–111)
Creatinine, Ser: 0.93 mg/dL (ref 0.61–1.24)
GFR calc Af Amer: >60 mL/min (ref >60)
GFR calc non Af Amer: >60 mL/min (ref >60)
Glucose, Bld: 140 mg/dL — ABNORMAL HIGH (ref 70–99)
Potassium: 4.3 mmol/L (ref 3.5–5.1)
SODIUM: 133 mmol/L — AB (ref 135–145)
Total Bilirubin: 2.3 mg/dL — ABNORMAL HIGH (ref 0.3–1.2)
Total Protein: 5.3 g/dL — ABNORMAL LOW (ref 6.5–8.1)

## 2018-10-27 LAB — CBC WITH DIFFERENTIAL/PLATELET
Abs Immature Granulocytes: 0 10*3/uL (ref 0.00–0.07)
Basophils Absolute: 0 10*3/uL (ref 0.0–0.1)
Basophils Relative: 0 %
EOS ABS: 0 10*3/uL (ref 0.0–0.5)
Eosinophils Relative: 0 %
HCT: 26.2 % — ABNORMAL LOW (ref 39.0–52.0)
HEMOGLOBIN: 8.7 g/dL — AB (ref 13.0–17.0)
Immature Granulocytes: 0 %
Lymphocytes Relative: 22 %
Lymphs Abs: 0.1 10*3/uL — ABNORMAL LOW (ref 0.7–4.0)
MCH: 30.6 pg (ref 26.0–34.0)
MCHC: 33.2 g/dL (ref 30.0–36.0)
MCV: 92.3 fL (ref 80.0–100.0)
Monocytes Absolute: 0.2 10*3/uL (ref 0.1–1.0)
Monocytes Relative: 30 %
Neutro Abs: 0.2 10*3/uL — ABNORMAL LOW (ref 1.7–7.7)
Neutrophils Relative %: 48 %
Platelets: 79 10*3/uL — ABNORMAL LOW (ref 150–400)
RBC: 2.84 MIL/uL — ABNORMAL LOW (ref 4.22–5.81)
RDW: 16.6 % — ABNORMAL HIGH (ref 11.5–15.5)
WBC: 0.5 10*3/uL — CL (ref 4.0–10.5)
nRBC: 0 % (ref 0.0–0.2)

## 2018-10-27 LAB — GLUCOSE, CAPILLARY: Glucose-Capillary: 87 mg/dL (ref 70–99)

## 2018-10-27 LAB — PATHOLOGIST SMEAR REVIEW

## 2018-10-27 LAB — PROTIME-INR
INR: 2.34
Prothrombin Time: 25.3 seconds — ABNORMAL HIGH (ref 11.4–15.2)

## 2018-10-27 LAB — MAGNESIUM: Magnesium: 1.6 mg/dL — ABNORMAL LOW (ref 1.7–2.4)

## 2018-10-27 MED ORDER — MAGNESIUM SULFATE 2 GM/50ML IV SOLN
2.0000 g | Freq: Once | INTRAVENOUS | Status: AC
  Administered 2018-10-27: 19:00:00 2 g via INTRAVENOUS
  Filled 2018-10-27: qty 50

## 2018-10-27 MED ORDER — SODIUM CHLORIDE 0.9 % IV SOLN: 2.0000 g | Freq: Once | INTRAVENOUS | Status: DC

## 2018-10-27 MED ORDER — TBO-FILGRASTIM 300 MCG/0.5ML ~~LOC~~ SOSY
300.0000 ug | PREFILLED_SYRINGE | Freq: Every day | SUBCUTANEOUS | Status: DC
  Administered 2018-10-27: 18:00:00 300 ug via SUBCUTANEOUS
  Filled 2018-10-27: qty 0.5

## 2018-10-27 MED ORDER — LORATADINE 10 MG PO TABS
10.0000 mg | ORAL_TABLET | Freq: Every day | ORAL | Status: DC
  Administered 2018-10-27 – 2018-10-30 (×4): 10 mg via ORAL
  Filled 2018-10-27 (×4): qty 1

## 2018-10-27 MED ORDER — WARFARIN SODIUM 1 MG PO TABS
1.0000 mg | ORAL_TABLET | Freq: Once | ORAL | Status: AC
  Administered 2018-10-27: 1 mg via ORAL
  Filled 2018-10-27: qty 1

## 2018-10-27 MED ORDER — WARFARIN - PHARMACIST DOSING INPATIENT
Freq: Every day | Status: DC
  Administered 2018-10-29: 18:00:00

## 2018-10-27 NOTE — Progress Notes (Signed)
Hematology/Oncology Progress Note [facility] [phone] [phone]  Patient Care Team: Guadalupe Maple, MD as PCP - General (Family Medicine) Murlean Iba, MD (Internal Medicine) Isaias Cowman, MD as Consulting Physician (Cardiology) Christene Lye, MD (General Surgery) Telford Nab, RN as Registered Nurse Knox Royalty, RN as [place] [facility]   Name of the patient: Kirk Jones  [id number]  Sep 14, 1941  Date of visit: 10/27/18   INTERVAL HISTORY-  Patient was seen and evaluated at bedside.  Patient sitting the chair.  Breathing oxygen via nasal cannula comfortably. Denies any chest pain, dysuria, fever or chills.  No cough.  Wife at bedside. Telemetry showed atrial fibrillation.  Heart rates better controlled  Review of systems- Review of Systems  Constitutional: Positive for fatigue. Negative for appetite change, chills, fever and unexpected weight change.  HENT:   Negative for hearing loss, sore throat and voice change.   Eyes: Negative for eye problems and icterus.  Respiratory: Negative for chest tightness, cough and shortness of breath.   Cardiovascular: Negative for chest pain and leg swelling.  Gastrointestinal: Negative for abdominal distention and abdominal pain.  Endocrine: Negative for hot flashes.  Genitourinary: Negative for difficulty urinating, dysuria and frequency.   Musculoskeletal: Negative for arthralgias.  Skin: Negative for itching and rash.  Neurological: Negative for headaches, light-headedness and numbness.  Hematological: Negative for adenopathy. Does not bruise/bleed easily.  Psychiatric/Behavioral: Negative for confusion.    Allergies  Allergen Reactions  . Prednisone Other (See Comments)    Reaction: "organs shutting down"    Patient Active Problem List   Diagnosis Date Noted  . Chemotherapy-induced neutropenia ([place])   . Thrombocytopenia ([place])   .  Unstageable pressure ulcer of sacral region ([place]) 10/25/2018  . Generalized weakness   . Acute on chronic congestive heart failure ([place])   . Transaminasemia 10/21/2018  . Hyperkalemia   . Supratherapeutic INR   . Palliative care encounter   . Syncope 10/12/2018  . Pressure injury of skin 10/12/2018  . Squamous cell carcinoma of right lung ([place]) 09/16/2018  . Lung mass 08/22/2018  . Exertional shortness of breath 06/07/2018  . Peripheral edema 06/07/2018  . Allergic rhinitis 02/12/2018  . Advanced care planning/counseling discussion 10/19/2017  . Expressive aphasia 06/13/2017  . Hx of adenomatous colonic polyps 02/18/2017  . Bilateral cataracts 08/18/2016  . CAD (coronary artery disease) 10/06/2015  . Pacemaker 10/06/2015  . Automatic implantable cardioverter-defibrillator in situ 10/06/2015  . Nodular prostate with urinary obstruction 10/06/2015  . Senile purpura ([place]) 10/06/2015  . Barrett's esophagus 05/28/2015  . Hypertension 05/28/2015  . COPD (chronic obstructive pulmonary disease) ([place]) 05/28/2015  . Hyperlipidemia 05/28/2015  . Diverticulosis 05/28/2015  . CKD (chronic kidney disease), stage III ([place]) 05/28/2015  . Gout 05/28/2015  . History of prosthetic heart valve 05/13/2015  . Atrial fibrillation ([place]) 05/13/2015  . CVA (cerebral vascular accident) ([place]) 07/05/2014  . Systolic congestive heart failure ([place]) 07/05/2014  . History of TIA (transient ischemic attack) 02/12/2013  . Warfarin anticoagulation 02/12/2013     Past Medical History:  Diagnosis Date  . AICD (automatic cardioverter/defibrillator) present   . Aortic valve regurgitation   . Atrial fibrillation ([place])   . Barrett's esophagus   . Cancer ([place])    sate III lung  . CHF (congestive heart failure) ([place])   . COPD (chronic obstructive pulmonary disease) ([place])   . Diverticulosis   . Dysrhythmia   . GERD (gastroesophageal reflux disease)   .  Gout   . Headache    aura only  . Heart murmur   .  Hematuria   . Hernia of abdominal cavity   . History of prosthetic heart valve   . Hyperlipidemia   . Hypertension   . Nodular prostate with urinary obstruction   . Presence of permanent cardiac pacemaker   . Renal insufficiency   . Substance abuse ([place])    alcohol     Past Surgical History:  Procedure Laterality Date  . CARDIAC VALVE REPLACEMENT N/A 2004  . CARPAL TUNNEL RELEASE Right 2015  . CATARACT EXTRACTION Right 2017  . COLONOSCOPY WITH PROPOFOL N/A 2014  . CORONARY ANGIOPLASTY     stints x2  . EYE SURGERY     cross eyed  . FOOT SURGERY Bilateral    [place]  . FRACTURE SURGERY Right    [place]  . INGUINAL HERNIA REPAIR Right 09/16/2017   Procedure: HERNIA REPAIR INGUINAL ADULT;  Surgeon: Christene Lye, MD;  Location: [facility] ORS;  Service: General;  Laterality: Right;  . INSERT / REPLACE / [place]     ICD  . PACEMAKER IMPLANT Left 2013   and ICD  . PORTA CATH INSERTION N/A 09/21/2018   Procedure: PORTA CATH INSERTION;  Surgeon: Algernon Huxley, MD;  Location: [facility];  Service: Cardiovascular;  Laterality: N/A;  . VASECTOMY      Social History   Socioeconomic History  . Marital status: Married    Spouse name: Belenda Cruise   . Number of children: 5  . Years of education: Not on file  . Highest education level: Not on file  Occupational History  . Not on file  Social Needs  . Financial resource strain: Not hard at all  . Food insecurity:    Worry: Never true    Inability: Never true  . Transportation needs:    Medical: No    Non-medical: No  Tobacco Use  . Smoking status: Former Smoker    Types: Cigarettes    Last attempt to quit: 04/24/1991    Years since quitting: 27.5  . Smokeless tobacco: Never Used  Substance and Sexual Activity  . Alcohol use: Yes    Alcohol/week: 2.0 standard drinks    Types: 2 Shots of liquor per week    Comment: quit December 2015/restarted-2 drinks per night  . Drug use: No  . Sexual activity: Never   Lifestyle  . Physical activity:    Days per week: 0 days    Minutes per session: 0 min  . Stress: Not at all  Relationships  . Social connections:    Talks on phone: Once a week    Gets together: Once a week    Attends religious service: More than 4 times per year    Active member of club or organization: No    Attends meetings of clubs or organizations: Never    Relationship status: Married  . Intimate partner violence:    Fear of current or ex partner: No    Emotionally abused: No    Physically abused: No    Forced sexual activity: No  Other Topics Concern  . Not on file  Social History Narrative  . Not on file     Family History  Problem Relation Age of Onset  . Heart attack Mother   . Diabetes Maternal Grandmother   . Lung cancer Sister      Current Facility-Administered Medications:  .  bisacodyl (DULCOLAX) EC tablet 5  mg, 5 mg, Oral, Daily PRN, Arta Silence, MD, 5 mg at 10/24/18 0848 .  ciprofloxacin (CIPRO) tablet 500 mg, 500 mg, Oral, Daily, Pyreddy, Pavan, MD, 500 mg at 10/27/18 0911 .  diltiazem (CARDIZEM) tablet 30 mg, 30 mg, Oral, Q8H, Pyreddy, Pavan, MD, 30 mg at 10/27/18 1525 .  finasteride (PROSCAR) tablet 5 mg, 5 mg, Oral, Daily, Jodell Cipro, Prasanna, MD, 5 mg at 10/27/18 0911 .  guaiFENesin (MUCINEX) 12 hr tablet 600 mg, 600 mg, Oral, BID, Pyreddy, Pavan, MD, 600 mg at 10/27/18 0911 .  guaiFENesin-dextromethorphan (ROBITUSSIN DM) 100-10 MG/5ML syrup 5 mL, 5 mL, Oral, Q4H PRN, Pyreddy, Pavan, MD .  metoprolol tartrate (LOPRESSOR) tablet 50 mg, 50 mg, Oral, BID, Pyreddy, Pavan, MD, 50 mg at 10/27/18 0911 .  pantoprazole (PROTONIX) EC tablet 40 mg, 40 mg, Oral, Daily, Jodell Cipro, Prasanna, MD, 40 mg at 10/27/18 0911 .  senna-docusate (Senokot-S) tablet 1 tablet, 1 tablet, Oral, QHS PRN, Arta Silence, MD .  Tbo-Filgrastim [facility]) injection 300 mcg, 300 mcg, Subcutaneous, q1800, Pyreddy, Pavan, MD, 300 mcg at 10/27/18 1735 .  tiotropium (SPIRIVA)  inhalation capsule ([facility] use ONLY) 18 mcg, 18 mcg, Inhalation, Daily, Pyreddy, Pavan, MD, 18 mcg at 10/27/18 0911 .  Warfarin - Pharmacist Dosing Inpatient, , Does not apply, q1800, Pyreddy, Reatha Harps, MD  Facility-Administered Medications Ordered in Other Encounters:  .  0.9 %  sodium chloride infusion, , Intravenous, Continuous, Ascencion Dike, [place]   Physical exam:  Vitals:   10/27/18 0503 10/27/18 0901 10/27/18 1303 10/27/18 1522  BP: 99/67 99/61 94/64  107/60  Pulse: (!) 121 100 93 84  Resp: 19 17 18    Temp: (!) 97.4 F (36.3 C) 98.1 F (36.7 C) 97.8 F (36.6 C)   TempSrc: Oral Oral Oral   SpO2:  100% 100%   Weight:      Height:       Physical Exam  Constitutional: He is oriented to person, place, and time. No distress.  HENT:  Head: Normocephalic and atraumatic.  Nose: Nose normal.  Mouth/Throat: Oropharynx is clear and moist. No oropharyngeal exudate.  Eyes: Pupils are equal, round, and reactive to light. EOM are normal. No scleral icterus.  Neck: Normal range of motion. Neck supple.  Cardiovascular: Normal rate and regular rhythm.  No murmur heard. irregular  Pulmonary/Chest: Effort normal. No respiratory distress. He has no rales. He exhibits no tenderness.  Decreased breath sound bilaterally.  Worse on the right side.  Abdominal: Soft. He exhibits no distension. There is no tenderness.  Musculoskeletal: Normal range of motion. He exhibits edema.  Neurological: He is alert and oriented to person, place, and time. No cranial nerve deficit. He exhibits normal muscle tone. Coordination normal.  Skin: Skin is warm and dry. He is not diaphoretic. No erythema.  Psychiatric: Affect normal.       CMP Latest Ref Rng & Units 10/27/2018  Glucose 70 - 99 mg/dL 140(H)  BUN 8 - 23 mg/dL 32(H)  Creatinine 0.61 - 1.24 mg/dL 0.93  Sodium 135 - 145 mmol/L 133(L)  Potassium 3.5 - 5.1 mmol/L 4.3  Chloride 98 - 111 mmol/L 94(L)  CO2 22 - 32 mmol/L 30  Calcium 8.9 - 10.3 mg/dL  8.2(L)  Total Protein 6.5 - 8.1 g/dL 5.3(L)  Total Bilirubin 0.3 - 1.2 mg/dL 2.3(H)  Alkaline Phos 38 - 126 U/L 113  AST 15 - 41 U/L 88(H)  ALT 0 - 44 U/L 529(H)   CBC Latest Ref Rng & Units 10/27/2018  WBC 4.0 -  10.5 K/uL 0.5(LL)  Hemoglobin 13.0 - 17.0 g/dL 8.7(L)  Hematocrit 39.0 - 52.0 % 26.2(L)  Platelets 150 - 400 K/uL 79(L)   RADIOGRAPHIC STUDIES: I have personally reviewed the radiological images as listed and agreed with the findings in the report. Dg Chest 1 View  Result Date: 10/21/2018 CLINICAL DATA:  77 y/o  M; aspiration into the respiratory tract. EXAM: CHEST  1 VIEW COMPARISON:  10/21/2018 chest CT and chest radiograph. FINDINGS: Stable right basilar opacity and right-sided pleural effusion. Interstitial pulmonary edema. Right port catheter tip projects over mid SVC. Stable cardiac silhouette given projection and technique. Single lead AICD. Post median sternotomy with wires in alignment. Calcific aortic atherosclerosis. No acute osseous abnormality is evident. IMPRESSION: Stable right basilar opacity corresponding to mass on CT and right-sided pleural effusion. Interstitial pulmonary edema. Aortic Atherosclerosis (ICD10-I70.0). Electronically Signed   By: Kristine Garbe M.D.   On: 10/21/2018 20:23   Ct Head Wo Contrast  Result Date: 10/12/2018 CLINICAL DATA:  Acute onset of dizziness and fall. Hit head on headboard. Laceration at the right temple. Patient on Coumadin. Initial encounter. EXAM: CT HEAD WITHOUT CONTRAST TECHNIQUE: Contiguous axial images were obtained from the base of the skull through the vertex without intravenous contrast. COMPARISON:  CT of the head performed 09/04/2018 FINDINGS: Brain: No evidence of acute infarction, hemorrhage, hydrocephalus, extra-axial collection or mass lesion / mass effect. Prominence of the ventricles and sulci reflects mild to moderate cortical volume loss. Cerebellar atrophy is noted. Scattered periventricular and  subcortical white matter change likely reflects small vessel ischemic microangiopathy. A small chronic lacunar infarct is noted at the left cerebellar hemisphere. The brainstem and fourth ventricle are within normal limits. The basal ganglia are unremarkable in appearance. The cerebral hemispheres demonstrate grossly normal gray-white differentiation. No mass effect or midline shift is seen. Vascular: No hyperdense vessel or unexpected calcification. Skull: There is no evidence of fracture; visualized osseous structures are unremarkable in appearance. Sinuses/Orbits: The visualized portions of the orbits are within normal limits. The paranasal sinuses and mastoid air cells are well-aerated. Other: A prominent soft tissue laceration is noted anterior to the right ear, with soft tissue air extending below the right ear. IMPRESSION: 1. No evidence of traumatic intracranial injury or fracture. 2. Prominent soft tissue laceration anterior to the right ear, with soft tissue air extending below the right ear. 3. Mild to moderate cortical volume loss and scattered small vessel ischemic microangiopathy. 4. Small chronic lacunar infarct at the left cerebellar hemisphere. Electronically Signed   By: Garald Balding M.D.   On: 10/12/2018 00:18   Ct Chest Wo Contrast  Result Date: 10/21/2018 CLINICAL DATA:  Shortness of breath and weakness. EXAM: CT CHEST WITHOUT CONTRAST TECHNIQUE: Multidetector CT imaging of the chest was performed following the standard protocol without IV contrast. COMPARISON:  08/21/2018 FINDINGS: Cardiovascular: Evaluation of vascular structures is limited without IV contrast material. Cardiac pacemaker. Diffuse cardiac enlargement. No pericardial effusions. Coronary artery calcifications. Postoperative changes in the mediastinum. Calcification throughout the aorta. Mediastinum/Nodes: Moderate prominent lymph nodes throughout the mediastinum without change since prior study, likely reactive.  Lungs/Pleura: Bilateral pleural effusions, greater on the right. Atelectasis in the lung bases. Known right lower lobe mass is visible but somewhat obscured by the effusion and atelectasis. Emphysematous changes in the lungs. No pneumothorax. Airways are patent. Upper Abdomen: Probable hepatic cirrhosis. Free fluid in the right upper quadrant likely represents ascites. Musculoskeletal: Degenerative changes in the spine. No destructive bone lesions. Postoperative sternotomy wires. IMPRESSION:  Known mass in the right lung base. Cardiac enlargement. Bilateral pleural effusions with basilar atelectasis, greater on the right. Mild progression on the right since previous study. Hepatic cirrhosis with upper abdominal ascites. Electronically Signed   By: Lucienne Capers M.D.   On: 10/21/2018 06:53   Dg Hand 2 View Left  Result Date: 10/12/2018 CLINICAL DATA:  Pt reports he had a fall last night and is now experiencing pain in his left hand. Pt hand appears swollen and red at this time EXAM: LEFT HAND - 2 VIEW COMPARISON:  None. FINDINGS: IV tubing overlies the wrist. There are degenerative changes insert wrist, primarily along the radial aspect of the carpals and the first carpometacarpal joint. There is degenerative change at the second metacarpophalangeal joint. No acute fracture or subluxation. Atherosclerotic calcification of the small arteries. IMPRESSION: No evidence for acute  abnormality.  Degenerative changes. Electronically Signed   By: Nolon Nations M.D.   On: 10/12/2018 15:27   US Venous Img Upper Uni Left  Result Date: 10/15/2018 CLINICAL DATA:  Left arm swelling. Recent fall. Left cardiac ICD. Edema from the elbow to the wrist. EXAM: LEFT UPPER EXTREMITY VENOUS DOPPLER ULTRASOUND TECHNIQUE: Gray-scale sonography with graded compression, as well as color Doppler and duplex ultrasound were performed to evaluate the upper extremity deep venous system from the level of the subclavian vein and  including the jugular, axillary, basilic, radial, ulnar and upper cephalic vein. Spectral Doppler was utilized to evaluate flow at rest and with distal augmentation maneuvers. COMPARISON:  Chest radiograph 10/11/2018 FINDINGS: Contralateral Subclavian Vein: Normal respiratory phasicity. Normal color Doppler flow in the right subclavian vein. No evidence for right subclavian thrombus. Internal Jugular Vein: No evidence of thrombus. Normal compressibility, respiratory phasicity and response to augmentation. Subclavian Vein: The left cardiac ICD lead is noted. There appears to be flow around the lead and left subclavian vein appears to be patent without thrombus. Axillary Vein: No evidence of thrombus. Normal compressibility, respiratory phasicity and response to augmentation. Cephalic Vein: No evidence of thrombus. Normal compressibility, respiratory phasicity and response to augmentation. Basilic Vein: No evidence of thrombus. Normal compressibility, respiratory phasicity and response to augmentation. Brachial Veins: No evidence of thrombus. Normal compressibility, respiratory phasicity and response to augmentation. Radial Veins: No evidence of thrombus. Normal compressibility, respiratory phasicity and response to augmentation. Single radial vein is identified. Ulnar Veins: No evidence of thrombus. Normal compressibility, respiratory phasicity and response to augmentation. Single ulnar vein is identified. Venous Reflux:  None visualized. Other Findings:  Subcutaneous edema. IMPRESSION: No evidence of DVT within the left upper extremity. Limited evaluation of the left subclavian vein due to the cardiac ICD but no evidence for thrombus in this area. Electronically Signed   By: Markus Daft M.D.   On: 10/15/2018 14:58   Dg Chest Port 1 View  Result Date: 10/21/2018 CLINICAL DATA:  Shortness of breath. EXAM: PORTABLE CHEST 1 VIEW COMPARISON:  Chest x-rays dated 10/11/2018 and 08/10/2018. FINDINGS: Increased opacity  at the RIGHT lung base. Probable mild atelectasis and/or small pleural effusion at the LEFT lung base. No pneumothorax seen. No acute or suspicious osseous finding. Stable cardiomegaly. RIGHT chest wall Port-A-Cath appears stable in position with tip at the level of the mid/lower SVC. LEFT chest wall pacemaker/ICD apparatus is stable in position. Median sternotomy wires appear intact and stable in alignment. IMPRESSION: 1. Increasing opacity at the RIGHT lung base. This is mostly related to the RIGHT lower lobe mass identified on recent chest CT (08/21/2018) and  PET-CT (08/30/2018), likely with superimposed pleural effusion and/or atelectasis that has slightly increased in the interval. 2. Stable cardiomegaly. 3. Probable mild atelectasis and/or small pleural effusion at the LEFT lung base. Electronically Signed   By: Franki Cabot M.D.   On: 10/21/2018 00:32   Dg Chest Port 1 View  Result Date: 10/12/2018 CLINICAL DATA:  Status post fall, with concern for chest injury. Initial encounter. EXAM: PORTABLE CHEST 1 VIEW COMPARISON:  CT of the chest performed 08/21/2018 FINDINGS: There is a persistent small right-sided pleural effusion. The known right lower lobe mass is better characterized on prior CT. There is suggestion of underlying interstitial edema, which may be transient in nature. No pneumothorax is seen. The cardiomediastinal silhouette is borderline normal in size. The patient is status post median sternotomy. An AICD is noted overlying the left chest wall, with a single lead ending overlying the right ventricle. The patient's right chest port is noted ending about the mid SVC. No displaced rib fractures are seen. IMPRESSION: 1. Persistent small right-sided pleural effusion. Known right lower lobe mass is better characterized on prior CT. Suggestion of underlying interstitial edema, which may be transient in nature. 2. No displaced rib fracture seen. Electronically Signed   By: Garald Balding M.D.   On:  10/12/2018 00:22   US Liver Doppler  Result Date: 10/21/2018 CLINICAL DATA:  Abnormal transaminases. Patient undergoing chemotherapy and radiation for stage III lung cancer. EXAM: DUPLEX ULTRASOUND OF LIVER TECHNIQUE: Color and duplex Doppler ultrasound was performed to evaluate the hepatic in-flow and out-flow vessels. COMPARISON:  CT abdomen 09/25/2018 FINDINGS: Gallbladder: The gallbladder is not definitively identified. In the gallbladder fossa, there is a structure which is could represent a severely contracted gallbladder. No significant gallbladder lumen is visualized. No stones are identified. Murphy's sign is negative. Common bile duct: Diameter: 4.1 mm, normal Liver: normal parenchymal echogenicity. Normal hepatic contour without nodularity. No focal lesion, mass or intrahepatic biliary ductal dilatation. Portal Vein Velocities Main:  29 cm/sec Right:  39 cm/sec Left:  116 cm/sec Main portal vein demonstrates bidirectional flow with intermittent hepatopetal and hepatofugal flow direction. Right and left portal veins demonstrate normal hepatopetal flow direction. Hepatic Vein Velocities Right:  32 cm/sec Middle:  56 cm/sec Left:  30 cm/sec Normal flow direction demonstrated in the hepatic veins. IVC: Present and patent with normal respiratory phasicity. Velocity measures 36 centimeters/second. Hepatic Artery Velocity:  130/32 cm/sec Splenic Vein Velocity:  48 cm/sec Varices: Not identified. Ascites: Minimal fluid over the liver edge. Incidental note of small right pleural effusion. IMPRESSION: 1. Gallbladder is markedly contracted, likely to be physiologic but nonspecific. No stones identified. 2. Normal flow velocities demonstrated in the portal and hepatic veins. Bidirectional flow is demonstrated in the main portal vein. Bidirectional flow may be due to Valsalva or early portal hypertension. 3. Right pleural effusion and small amount of ascites. Electronically Signed   By: Lucienne Capers M.D.    On: 10/21/2018 05:26   US Abdomen Limited Ruq  Result Date: 10/21/2018 CLINICAL DATA:  Abnormal transaminases. Patient undergoing chemotherapy and radiation for stage III lung cancer. EXAM: DUPLEX ULTRASOUND OF LIVER TECHNIQUE: Color and duplex Doppler ultrasound was performed to evaluate the hepatic in-flow and out-flow vessels. COMPARISON:  CT abdomen 09/25/2018 FINDINGS: Gallbladder: The gallbladder is not definitively identified. In the gallbladder fossa, there is a structure which is could represent a severely contracted gallbladder. No significant gallbladder lumen is visualized. No stones are identified. Murphy's sign is negative. Common bile duct: Diameter:  4.1 mm, normal Liver: normal parenchymal echogenicity. Normal hepatic contour without nodularity. No focal lesion, mass or intrahepatic biliary ductal dilatation. Portal Vein Velocities Main:  29 cm/sec Right:  39 cm/sec Left:  116 cm/sec Main portal vein demonstrates bidirectional flow with intermittent hepatopetal and hepatofugal flow direction. Right and left portal veins demonstrate normal hepatopetal flow direction. Hepatic Vein Velocities Right:  32 cm/sec Middle:  56 cm/sec Left:  30 cm/sec Normal flow direction demonstrated in the hepatic veins. IVC: Present and patent with normal respiratory phasicity. Velocity measures 36 centimeters/second. Hepatic Artery Velocity:  130/32 cm/sec Splenic Vein Velocity:  48 cm/sec Varices: Not identified. Ascites: Minimal fluid over the liver edge. Incidental note of small right pleural effusion. IMPRESSION: 1. Gallbladder is markedly contracted, likely to be physiologic but nonspecific. No stones identified. 2. Normal flow velocities demonstrated in the portal and hepatic veins. Bidirectional flow is demonstrated in the main portal vein. Bidirectional flow may be due to Valsalva or early portal hypertension. 3. Right pleural effusion and small amount of ascites. Electronically Signed   By: Lucienne Capers  M.D.   On: 10/21/2018 05:26    Assessment and plan-  Patient is a 77 y.o. male with multiple comorbidities, recent diagnosis of stage III lung cancer, recently on concurrent chemo and radiation. Last chemotherapy believed to be on 10/19/2018 currently admitted due to hyponatremia, hypotension, ischemia hepatitis, CHF  #Neutropenia, with ANC further trended down to 0.2.  Started on Cipro 500mg  daily yesterday for prophylaxis.  Watch for possible QTc prolongation. He is in A Fib, not able to determine QTc from his telemetry. EKG on 10/26/2018 showed QTc of 544ms. I will hold Cipro for now.  Added Granix 319mcg daily.  # Hypomagnesia: will give Magnesium sulfate 2 g IV x 1 today. Monitor electrolytes and cbc daily  #Ischemic hepatitis, transaminitis trending down.  #Anemia, hemoglobin gradually trending down.  #Thrombocytopenia, platelet count stable.   Thank you for allowing me to participate in the care of this patient.   Earlie Server, MD, PhD Hematology Oncology [facility] Pager- [phone] 10/27/2018

## 2018-10-27 NOTE — Progress Notes (Signed)
[place] for Warfarin Indication: atrial fibrillation  Allergies  Allergen Reactions  . Prednisone Other (See Comments)    Reaction: "organs shutting down"    Patient Measurements: Height: 5\' 3"  (160 cm) Weight: 166 lb 14.2 oz (75.7 kg) IBW/kg (Calculated) : 56.9  Vital Signs: Temp: 97.4 F (36.3 C) (11/29 0503) Temp Source: Oral (11/29 0503) BP: 99/67 (11/29 0503) Pulse Rate: 121 (11/29 0503)  Labs: Recent Labs    10/25/18 0553 10/25/18 0734  10/26/18 0513 10/26/18 0940 10/27/18 0551  HGB  --   --    < > 8.9* 9.1* 8.7*  HCT  --   --   --  26.7* 27.0* 26.2*  PLT  --   --   --  79* 83* 79*  LABPROT  --  38.9*  --  31.9*  --  25.3*  INR  --  4.14*  --  3.15  --  2.34  CREATININE 1.00  --   --  0.90  --  0.93   < > = values in this interval not displayed.    Estimated Creatinine Clearance: 60.6 mL/min (by C-G formula based on SCr of 0.93 mg/dL).   Medical History: Past Medical History:  Diagnosis Date  . AICD (automatic cardioverter/defibrillator) present   . Aortic valve regurgitation   . Atrial fibrillation ([place])   . Barrett's esophagus   . Cancer ([place])    sate III lung  . CHF (congestive heart failure) ([place])   . COPD (chronic obstructive pulmonary disease) ([place])   . Diverticulosis   . Dysrhythmia   . GERD (gastroesophageal reflux disease)   . Gout   . Headache    aura only  . Heart murmur   . Hematuria   . Hernia of abdominal cavity   . History of prosthetic heart valve   . Hyperlipidemia   . Hypertension   . Nodular prostate with urinary obstruction   . Presence of permanent cardiac pacemaker   . Renal insufficiency   . Substance abuse (HCC)    alcohol    Medications:  Scheduled:  . ciprofloxacin  500 mg Oral Daily  . diltiazem  30 mg Oral Q8H  . finasteride  5 mg Oral Daily  . guaiFENesin  600 mg Oral BID  . metoprolol tartrate  50 mg Oral BID  . pantoprazole  40 mg Oral Daily  . tiotropium  18  mcg Inhalation Daily   Infusions:   PRN: bisacodyl, guaiFENesin-dextromethorphan, senna-docusate  Assessment: 77 year old male with hx of Afib, presented with SOB and weakness. Transaminasemia holding amiodarone. INR elevated but trending down.Home regimen is warfarin 3 mg daily.   Date INR Warfarin Dose  11/23 7.26 None- vitamin K 5 mg x1   11/24 5.13 None  11/24 4.67 None  11/24 4.19 None  11/25 3.95 Hold  11/26 4.36 Hold   11/27 4.14 Hold  11/28 3.15 Hold  11/29 2.34     Goal of Therapy:  INR 2-3 Monitor platelets by anticoagulation protocol: Yes   Plan:  INR therapeutic.Will order Warfarin 1 mg x 1 tonight Patient on Cipro-DDI with Warfarin.  Monitor LFTs - trending down. Daily INR and CBC will be ordered.   Noralee Space, PharmD, BCPS Clinical Pharmacist  10/27/2018,7:24 AM

## 2018-10-27 NOTE — Progress Notes (Signed)
[place] [facility]    MR#:  [id number]  DATE OF BIRTH:  [birth year]  SUBJECTIVE:  CHIEF COMPLAINT:   Chief Complaint  Patient presents with  . Shortness of Breath  . Weakness  Patient seen and evaluated today Heart rate better controlled after addition of Cardizem Currently in A. fib Comfortable on oxygen via nasal cannula at 2 L No cough No chest pain No nausea or vomiting No fever   REVIEW OF SYSTEMS:    ROS  CONSTITUTIONAL: No documented fever. Has fatigue, weakness. No weight gain, no weight loss.  EYES: No blurry or double vision.  ENT: No tinnitus. No postnasal drip. No redness of the oropharynx.  RESPIRATORY: Has decreased cough, no wheeze, no hemoptysis.  Mild dyspnea on exertion.  CARDIOVASCULAR: No chest pain. No orthopnea.  Has palpitations. No syncope.  GASTROINTESTINAL: No nausea, no vomiting or diarrhea. No abdominal pain. No melena or hematochezia.  GENITOURINARY: No dysuria or hematuria.  ENDOCRINE: No polyuria or nocturia. No heat or cold intolerance.  HEMATOLOGY: No anemia. No bruising. No bleeding.  INTEGUMENTARY: No rashes. No lesions.  MUSCULOSKELETAL: No arthritis. swelling lower extremities. No gout.  NEUROLOGIC: No numbness, tingling, or ataxia. No seizure-type activity.  PSYCHIATRIC: No anxiety. No insomnia. No ADD.   DRUG ALLERGIES:   Allergies  Allergen Reactions  . Prednisone Other (See Comments)    Reaction: "organs shutting down"    VITALS:  Blood pressure 99/61, pulse 100, temperature 98.1 F (36.7 C), temperature source Oral, resp. rate 17, height 5\' 3"  (1.6 m), weight 75.7 kg, SpO2 100 %.  PHYSICAL EXAMINATION:   Physical Exam  GENERAL:  77 y.o.-year-old patient lying in the bed with no acute distress.  EYES: Pupils equal, round, reactive to light and accommodation. No scleral icterus. Extraocular muscles intact.  HEENT: Head atraumatic, normocephalic. Oropharynx  and nasopharynx clear.  NECK:  Supple, no jugular venous distention. No thyroid enlargement, no tenderness.  LUNGS : Improved breath sounds bilaterally, bibasal basal crepitations heard. No use of accessory muscles of respiration.  CARDIOVASCULAR: S1, S2 irregular. No murmurs, rubs, or gallops.  ABDOMEN: Soft, nontender, nondistended. Bowel sounds present. No organomegaly or mass.  EXTREMITIES: No cyanosis, clubbing  1+ edema b/l.    NEUROLOGIC: Cranial nerves II through XII are intact. No focal Motor or sensory deficits b/l.   PSYCHIATRIC: The patient is alert and oriented x 3.  SKIN: No obvious rash, lesion, or ulcer.   LABORATORY PANEL:   CBC Recent Labs  Lab 10/27/18 0551  WBC 0.5*  HGB 8.7*  HCT 26.2*  PLT 79*   ------------------------------------------------------------------------------------------------------------------ Chemistries  Recent Labs  Lab 10/23/18 0527  10/27/18 0551  NA 134*   < > 133*  K 4.7   < > 4.3  CL 94*   < > 94*  CO2 30   < > 30  GLUCOSE 130*   < > 140*  BUN 49*   < > 32*  CREATININE 1.33*   < > 0.93  CALCIUM 7.4*   < > 8.2*  MG 1.8  --   --   AST 1,461*   < > 88*  ALT 1,934*   < > 529*  ALKPHOS 134*   < > 113  BILITOT 1.9*   < > 2.3*   < > = values in this interval not displayed.   ------------------------------------------------------------------------------------------------------------------  Cardiac Enzymes Recent Labs  Lab 10/21/18 0755  TROPONINI 0.08*   ------------------------------------------------------------------------------------------------------------------  RADIOLOGY:  No results found.   ASSESSMENT AND PLAN:   77 year old male patient with history of atrial fibrillation, aortic valve regurgitation, Barrett's esophagus, stage III lung cancer, chronic congestive heart failure, COPD, GERD, gout, hyperlipidemia, hypertension currently in ICU  -Leukopenia Probably from radiation therapy versus bone marrow  ischemia Hematology follow-up On oral ciprofloxacin antibiotic prophylactically Follow-up with oncology and hematology whether patient needs Granix  -Acute ischemic hepatitis resolving mostly Liver enzymes trending down Gastroenterology follow-up appreciated Daily LFTs Avoid hepatotoxic drugs  -Supratherapeutic INR secondary to liver failure  Currently INR is greater than 2.34 Will resume Coumadin today INR goal is 2-3  -Acute hyperkalemia resolved  -Right-sided heart failure with congestive hepatopathy with history of aortic valve replacement Medical management continuing  -Acute renal failure on chronic kidney disease stage III Appreciate nephrology evaluation Monitor renal function  -Stage III lung cancer Status post oncology evaluation Further chemotherapy deferred in view of poor cardiopulmonary status by oncology  -Atrial fibrillation with rapid rate Status post cardiology evaluation Amiodarone on hold due to hepatocellular failure Increase metoprolol to 50 mg every 12 hourly Maintain INR between 2 and 3 Oral Cardizem 30 mg every 8 hourly  -Pleural effusion/fluid overload improving Hold off on IV fluids  Mucinex for expectoration PRN Robitussin for cough  -Hepatitis A positive Recommended and counseled good hand and oral hygiene Supportive care  -Ambulatory dysfunction and gait instability Status post physical therapy evaluation SNF placement once clinically stable versus home with home health services Will discuss with family and patient  All the records are reviewed and case discussed with Care Management/Social Worker. Management plans discussed with the patient, family and they are in agreement.  CODE STATUS: Full code  DVT Prophylaxis: SCDs  TOTAL TIME TAKING CARE OF THIS PATIENT: 33 minutes.   POSSIBLE D/C IN 2 to 3 DAYS, DEPENDING ON CLINICAL CONDITION.  Saundra Shelling M.D on 10/27/2018 at 11:57 AM  Between 7am to 6pm - Pager -  [phone]  After 6pm go to [email or web address] - password [email or web address] [facility]  Office  [phone]  CC: Primary care physician; Guadalupe Maple, MD  Note: This dictation was prepared with Dragon dictation along with smaller phrase technology. Any transcriptional errors that result from this process are unintentional.

## 2018-10-27 NOTE — Care Management Important Message (Signed)
Copy of signed IM left with patient in room.  

## 2018-10-28 LAB — COMPREHENSIVE METABOLIC PANEL
ALT: 339 U/L — AB (ref 0–44)
AST: 54 U/L — AB (ref 15–41)
Albumin: 2.3 g/dL — ABNORMAL LOW (ref 3.5–5.0)
Alkaline Phosphatase: 92 U/L (ref 38–126)
Anion gap: 10 (ref 5–15)
BUN: 26 mg/dL — ABNORMAL HIGH (ref 8–23)
CO2: 29 mmol/L (ref 22–32)
Calcium: 8 mg/dL — ABNORMAL LOW (ref 8.9–10.3)
Chloride: 93 mmol/L — ABNORMAL LOW (ref 98–111)
Creatinine, Ser: 0.85 mg/dL (ref 0.61–1.24)
GFR calc Af Amer: >60 mL/min (ref >60)
GFR calc non Af Amer: >60 mL/min (ref >60)
Glucose, Bld: 103 mg/dL — ABNORMAL HIGH (ref 70–99)
Potassium: 3.9 mmol/L (ref 3.5–5.1)
Sodium: 132 mmol/L — ABNORMAL LOW (ref 135–145)
Total Bilirubin: 2.9 mg/dL — ABNORMAL HIGH (ref 0.3–1.2)
Total Protein: 4.8 g/dL — ABNORMAL LOW (ref 6.5–8.1)

## 2018-10-28 LAB — CBC
HCT: 24.5 % — ABNORMAL LOW (ref 39.0–52.0)
Hemoglobin: 8.2 g/dL — ABNORMAL LOW (ref 13.0–17.0)
MCH: 30.8 pg (ref 26.0–34.0)
MCHC: 33.5 g/dL (ref 30.0–36.0)
MCV: 92.1 fL (ref 80.0–100.0)
NRBC: 0 % (ref 0.0–0.2)
Platelets: 75 10*3/uL — ABNORMAL LOW (ref 150–400)
RBC: 2.66 MIL/uL — AB (ref 4.22–5.81)
RDW: 16.8 % — ABNORMAL HIGH (ref 11.5–15.5)
WBC: 0.7 10*3/uL — CL (ref 4.0–10.5)

## 2018-10-28 LAB — PROTIME-INR
INR: 1.97
Prothrombin Time: 22.2 seconds — ABNORMAL HIGH (ref 11.4–15.2)

## 2018-10-28 MED ORDER — WARFARIN SODIUM 3 MG PO TABS
3.0000 mg | ORAL_TABLET | Freq: Once | ORAL | Status: AC
  Administered 2018-10-28: 3 mg via ORAL
  Filled 2018-10-28: qty 1

## 2018-10-28 MED ORDER — TBO-FILGRASTIM 480 MCG/0.8ML ~~LOC~~ SOSY
480.0000 ug | PREFILLED_SYRINGE | Freq: Every day | SUBCUTANEOUS | Status: DC
  Administered 2018-10-28 – 2018-10-29 (×2): 480 ug via SUBCUTANEOUS
  Filled 2018-10-28 (×2): qty 0.8

## 2018-10-28 MED ORDER — WARFARIN SODIUM 2 MG PO TABS: 2.0000 mg | ORAL_TABLET | Freq: Once | ORAL | Status: DC

## 2018-10-28 NOTE — Progress Notes (Addendum)
[place] for Warfarin Indication: atrial fibrillation  Allergies  Allergen Reactions  . Prednisone Other (See Comments)    Reaction: "organs shutting down"    Patient Measurements: Height: 5\' 3"  (160 cm) Weight: 166 lb 7.2 oz (75.5 kg) IBW/kg (Calculated) : 56.9  Vital Signs: Temp: 97.9 F (36.6 C) (11/30 0343) Temp Source: Oral (11/30 0343) BP: 91/75 (11/30 0343) Pulse Rate: 78 (11/30 0343)  Labs: Recent Labs    10/26/18 0513 10/26/18 0940 10/27/18 0551 10/28/18 0557  HGB 8.9* 9.1* 8.7* 8.2*  HCT 26.7* 27.0* 26.2* 24.5*  PLT 79* 83* 79* 75*  LABPROT 31.9*  --  25.3* 22.2*  INR 3.15  --  2.34 1.97  CREATININE 0.90  --  0.93 0.85    Estimated Creatinine Clearance: 66.2 mL/min (by C-G formula based on SCr of 0.85 mg/dL).   Medical History: Past Medical History:  Diagnosis Date  . AICD (automatic cardioverter/defibrillator) present   . Aortic valve regurgitation   . Atrial fibrillation ([place])   . Barrett's esophagus   . Cancer ([place])    sate III lung  . CHF (congestive heart failure) ([place])   . COPD (chronic obstructive pulmonary disease) ([place])   . Diverticulosis   . Dysrhythmia   . GERD (gastroesophageal reflux disease)   . Gout   . Headache    aura only  . Heart murmur   . Hematuria   . Hernia of abdominal cavity   . History of prosthetic heart valve   . Hyperlipidemia   . Hypertension   . Nodular prostate with urinary obstruction   . Presence of permanent cardiac pacemaker   . Renal insufficiency   . Substance abuse (HCC)    alcohol    Medications:  Scheduled:  . diltiazem  30 mg Oral Q8H  . finasteride  5 mg Oral Daily  . guaiFENesin  600 mg Oral BID  . loratadine  10 mg Oral Daily  . metoprolol tartrate  50 mg Oral BID  . pantoprazole  40 mg Oral Daily  . Tbo-filgastrim (GRANIX) SQ  300 mcg Subcutaneous q1800  . tiotropium  18 mcg Inhalation Daily  . Warfarin - Pharmacist Dosing Inpatient   Does not  apply q1800   Infusions:   PRN: bisacodyl, guaiFENesin-dextromethorphan, senna-docusate  Assessment: 77 year old male with hx of Afib, presented with SOB and weakness. Transaminasemia holding amiodarone. INR elevated but trending down.Home regimen is warfarin 3 mg daily.   Date INR Warfarin Dose  11/23 7.26 None- vitamin K 5 mg x1   11/24 5.13 None  11/24 4.67 None  11/24 4.19 None  11/25 3.95 Hold  11/26 4.36 Hold   11/27 4.14 Hold  11/28 3.15 Hold  11/29 2.34 1mg   11/30 1.97     Goal of Therapy:  INR 2-3 Monitor platelets by anticoagulation protocol: Yes   Plan:  INR slightly subtherapeutic.Will order Warfarin 3 mg x 1 tonight Cipro now discontinued.   Monitor LFTs - trending down. Daily INR and CBC will be ordered.   Pernell Dupre, PharmD, BCPS Clinical Pharmacist  10/28/2018,8:26 AM

## 2018-10-28 NOTE — Progress Notes (Signed)
[place] at [facility]    MR#:  [id number]  DATE OF BIRTH:  [birth year]  SUBJECTIVE:  CHIEF COMPLAINT:   Chief Complaint  Patient presents with  . Shortness of Breath  . Weakness  Patient seen and evaluated today No fever No complaints of any chest pain, palpitations Tolerating physical therapy okay Heart rate better controlled with Cardizem and metoprolol    REVIEW OF SYSTEMS:    ROS  CONSTITUTIONAL: No documented fever. Has fatigue, weakness. No weight gain, no weight loss.  EYES: No blurry or double vision.  ENT: No tinnitus. No postnasal drip. No redness of the oropharynx.  RESPIRATORY: Has decreased cough, no wheeze, no hemoptysis.  Mild dyspnea on exertion.  CARDIOVASCULAR: No chest pain. No orthopnea.  Has palpitations. No syncope.  GASTROINTESTINAL: No nausea, no vomiting or diarrhea. No abdominal pain. No melena or hematochezia.  GENITOURINARY: No dysuria or hematuria.  ENDOCRINE: No polyuria or nocturia. No heat or cold intolerance.  HEMATOLOGY: No anemia. No bruising. No bleeding.  INTEGUMENTARY: No rashes. No lesions.  MUSCULOSKELETAL: No arthritis. swelling lower extremities. No gout.  NEUROLOGIC: No numbness, tingling, or ataxia. No seizure-type activity.  PSYCHIATRIC: No anxiety. No insomnia. No ADD.   DRUG ALLERGIES:   Allergies  Allergen Reactions  . Prednisone Other (See Comments)    Reaction: "organs shutting down"    VITALS:  Blood pressure 98/66, pulse (!) 56, temperature 97.9 F (36.6 C), temperature source Oral, resp. rate 19, height 5\' 3"  (1.6 m), weight 75.5 kg, SpO2 100 %.  PHYSICAL EXAMINATION:   Physical Exam  GENERAL:  77 y.o.-year-old patient lying in the bed with no acute distress.  EYES: Pupils equal, round, reactive to light and accommodation. No scleral icterus. Extraocular muscles intact.  HEENT: Head atraumatic, normocephalic. Oropharynx and nasopharynx clear.  NECK:   Supple, no jugular venous distention. No thyroid enlargement, no tenderness.  LUNGS : Improved breath sounds bilaterally, bibasal basal crepitations heard. No use of accessory muscles of respiration.  CARDIOVASCULAR: S1, S2 irregular. No murmurs, rubs, or gallops.  ABDOMEN: Soft, nontender, nondistended. Bowel sounds present. No organomegaly or mass.  EXTREMITIES: No cyanosis, clubbing  1+ edema b/l.    NEUROLOGIC: Cranial nerves II through XII are intact. No focal Motor or sensory deficits b/l.   PSYCHIATRIC: The patient is alert and oriented x 3.  SKIN: No obvious rash, lesion, or ulcer.   LABORATORY PANEL:   CBC Recent Labs  Lab 10/28/18 0557  WBC 0.7*  HGB 8.2*  HCT 24.5*  PLT 75*   ------------------------------------------------------------------------------------------------------------------ Chemistries  Recent Labs  Lab 10/27/18 0550  10/28/18 0557  NA  --    < > 132*  K  --    < > 3.9  CL  --    < > 93*  CO2  --    < > 29  GLUCOSE  --    < > 103*  BUN  --    < > 26*  CREATININE  --    < > 0.85  CALCIUM  --    < > 8.0*  MG 1.6*  --   --   AST  --    < > 54*  ALT  --    < > 339*  ALKPHOS  --    < > 92  BILITOT  --    < > 2.9*   < > = values in this interval not displayed.   ------------------------------------------------------------------------------------------------------------------  Cardiac Enzymes No results for input(s): TROPONINI in the last 168 hours. ------------------------------------------------------------------------------------------------------------------  RADIOLOGY:  No results found.   ASSESSMENT AND PLAN:   77 year old male patient with history of atrial fibrillation, aortic valve regurgitation, Barrett's esophagus, stage III lung cancer, chronic congestive heart failure, COPD, GERD, gout, hyperlipidemia, hypertension currently in ICU  -Leukopenia severe Probably from radiation therapy versus bone marrow ischemia Hematology  follow-up appreciated Started on Granix injection subcutaneously Increase the dose to 480 mcg today subcutaneously Follow-up WBC count No evidence of any fever  -Acute ischemic hepatitis resolved Liver enzymes trended down Gastroenterology follow-up appreciated Avoid hepatotoxic drugs  -Subtherapeutic INR  Coumadin coagulopathy resolved Resume Coumadin at higher dose with INR goal of 2-3  -Acute hypomagnesemia Replace magnesium intravenously yesterday  -Right-sided heart failure with congestive hepatopathy with history of aortic valve replacement Medical management only  -Chronic kidney disease stage III Appreciate nephrology evaluation Monitor renal function  -Stage III lung cancer Status post oncology evaluation Further chemotherapy deferred in view of poor cardiopulmonary status by oncology  -Atrial fibrillation with rapid rate Status post cardiology evaluation Amiodarone on hold due to hepatocellular failure Increased metoprolol to 50 mg every 12 hourly Maintain INR between 2 and 3 Oral Cardizem 30 mg every 8 hourly  -Hepatitis A positive Recommended and counseled good hand and oral hygiene Supportive care  -Ambulatory dysfunction and gait instability Status post physical therapy evaluation SNF placement once clinically stable and WBC count improves  All the records are reviewed and case discussed with Care Management/Social Worker. Management plans discussed with the patient, family and they are in agreement.  CODE STATUS: Full code  DVT Prophylaxis: SCDs  TOTAL TIME TAKING CARE OF THIS PATIENT: 33 minutes.   POSSIBLE D/C IN 2 to 3 DAYS, DEPENDING ON CLINICAL CONDITION.  Saundra Shelling M.D on 10/28/2018 at 12:16 PM  Between 7am to 6pm - Pager - [phone]  After 6pm go to [email or web address] - password [email or web address] [facility]  Office  [phone]  CC: Primary care physician; Guadalupe Maple, MD  Note: This dictation was prepared with  Dragon dictation along with smaller phrase technology. Any transcriptional errors that result from this process are unintentional.

## 2018-10-29 LAB — DIFFERENTIAL
Abs Immature Granulocytes: 0.1 10*3/uL — ABNORMAL HIGH (ref 0.00–0.07)
Basophils Absolute: 0 10*3/uL (ref 0.0–0.1)
Basophils Relative: 1 %
EOS ABS: 0 10*3/uL (ref 0.0–0.5)
Eosinophils Relative: 0 %
Immature Granulocytes: 6 %
Lymphocytes Relative: 11 %
Lymphs Abs: 0.2 10*3/uL — ABNORMAL LOW (ref 0.7–4.0)
Monocytes Absolute: 0.6 10*3/uL (ref 0.1–1.0)
Monocytes Relative: 38 %
Neutro Abs: 0.7 10*3/uL — ABNORMAL LOW (ref 1.7–7.7)
Neutrophils Relative %: 44 %

## 2018-10-29 LAB — MAGNESIUM: Magnesium: 1.7 mg/dL (ref 1.7–2.4)

## 2018-10-29 LAB — CBC
HCT: 25.1 % — ABNORMAL LOW (ref 39.0–52.0)
HEMOGLOBIN: 8.3 g/dL — AB (ref 13.0–17.0)
MCH: 30.5 pg (ref 26.0–34.0)
MCHC: 33.1 g/dL (ref 30.0–36.0)
MCV: 92.3 fL (ref 80.0–100.0)
Platelets: 80 10*3/uL — ABNORMAL LOW (ref 150–400)
RBC: 2.72 MIL/uL — ABNORMAL LOW (ref 4.22–5.81)
RDW: 16.9 % — ABNORMAL HIGH (ref 11.5–15.5)
WBC: 1.7 10*3/uL — ABNORMAL LOW (ref 4.0–10.5)
nRBC: 3 % — ABNORMAL HIGH (ref 0.0–0.2)

## 2018-10-29 LAB — PROTIME-INR
INR: 1.86
Prothrombin Time: 21.2 seconds — ABNORMAL HIGH (ref 11.4–15.2)

## 2018-10-29 MED ORDER — MAGNESIUM OXIDE 400 (241.3 MG) MG PO TABS
800.0000 mg | ORAL_TABLET | Freq: Once | ORAL | Status: AC
  Administered 2018-10-29: 15:00:00 800 mg via ORAL
  Filled 2018-10-29: qty 2

## 2018-10-29 MED ORDER — WARFARIN SODIUM 3 MG PO TABS
3.0000 mg | ORAL_TABLET | Freq: Every day | ORAL | Status: DC
  Administered 2018-10-29: 3 mg via ORAL
  Filled 2018-10-29 (×2): qty 1

## 2018-10-29 NOTE — Progress Notes (Signed)
[place] for Warfarin Indication: atrial fibrillation  Allergies  Allergen Reactions  . Prednisone Other (See Comments)    Reaction: "organs shutting down"    Patient Measurements: Height: 5\' 3"  (160 cm) Weight: 166 lb 8 oz (75.5 kg) IBW/kg (Calculated) : 56.9  Vital Signs: Temp: 98 F (36.7 C) (12/01 0428) Temp Source: Oral (12/01 0428) BP: 104/62 (12/01 0655) Pulse Rate: 87 (12/01 0655)  Labs: Recent Labs    10/27/18 0551 10/28/18 0557 10/29/18 0733  HGB 8.7* 8.2* 8.3*  HCT 26.2* 24.5* 25.1*  PLT 79* 75* 80*  LABPROT 25.3* 22.2* 21.2*  INR 2.34 1.97 1.86  CREATININE 0.93 0.85  --     Estimated Creatinine Clearance: 66.2 mL/min (by C-G formula based on SCr of 0.85 mg/dL).   Medical History: Past Medical History:  Diagnosis Date  . AICD (automatic cardioverter/defibrillator) present   . Aortic valve regurgitation   . Atrial fibrillation ([place])   . Barrett's esophagus   . Cancer ([place])    sate III lung  . CHF (congestive heart failure) ([place])   . COPD (chronic obstructive pulmonary disease) ([place])   . Diverticulosis   . Dysrhythmia   . GERD (gastroesophageal reflux disease)   . Gout   . Headache    aura only  . Heart murmur   . Hematuria   . Hernia of abdominal cavity   . History of prosthetic heart valve   . Hyperlipidemia   . Hypertension   . Nodular prostate with urinary obstruction   . Presence of permanent cardiac pacemaker   . Renal insufficiency   . Substance abuse (HCC)    alcohol    Medications:  Scheduled:  . diltiazem  30 mg Oral Q8H  . finasteride  5 mg Oral Daily  . guaiFENesin  600 mg Oral BID  . loratadine  10 mg Oral Daily  . metoprolol tartrate  50 mg Oral BID  . pantoprazole  40 mg Oral Daily  . Tbo-filgastrim (GRANIX) SQ  480 mcg Subcutaneous q1800  . tiotropium  18 mcg Inhalation Daily  . Warfarin - Pharmacist Dosing Inpatient   Does not apply q1800   Infusions:   PRN: bisacodyl,  guaiFENesin-dextromethorphan, senna-docusate  Assessment: 77 year old male with hx of Afib, presented with SOB and weakness. Transaminasemia holding amiodarone. INR elevated but trending down.Home regimen is warfarin 3 mg daily.   Date INR Warfarin Dose  11/23 7.26 None- vitamin K 5 mg x1   11/24 5.13 None  11/24 4.67 None  11/24 4.19 None  11/25 3.95 Hold  11/26 4.36 Hold   11/27 4.14 Hold  11/28 3.15 Hold  11/29 2.34 1mg   11/30 1.97 3 mg  12/1 1.86     Goal of Therapy:  INR 2-3 Monitor platelets by anticoagulation protocol: Yes   Plan:  Continue home regimen warfarin 3 mg po daily. Cipro now discontinued.   Monitor LFTs - trending down. Daily INR and CBC will be ordered.   Laural Benes, PharmD, BCPS Clinical Pharmacist  10/29/2018,9:29 AM

## 2018-10-29 NOTE — Progress Notes (Signed)
[facility]    MR#:  [id number]  DATE OF BIRTH:  10/19/1941  SUBJECTIVE:  CHIEF COMPLAINT:   Chief Complaint  Patient presents with  . Shortness of Breath  . Weakness  Patient seen and evaluated today No fever No complaints of any chest pain, palpitations No abdominal pain, nausea and vomiting Heart rate better controlled with Cardizem and metoprolol Okay appetite  REVIEW OF SYSTEMS:    ROS  CONSTITUTIONAL: No documented fever. Has fatigue, weakness. No weight gain, no weight loss.  EYES: No blurry or double vision.  ENT: No tinnitus. No postnasal drip. No redness of the oropharynx.  RESPIRATORY: Has decreased cough, no wheeze, no hemoptysis.  Mild dyspnea on exertion.  CARDIOVASCULAR: No chest pain. No orthopnea.  Has palpitations. No syncope.  GASTROINTESTINAL: No nausea, no vomiting or diarrhea. No abdominal pain. No melena or hematochezia.  GENITOURINARY: No dysuria or hematuria.  ENDOCRINE: No polyuria or nocturia. No heat or cold intolerance.  HEMATOLOGY: No anemia. No bruising. No bleeding.  INTEGUMENTARY: No rashes. No lesions.  MUSCULOSKELETAL: No arthritis. swelling lower extremities. No gout.  NEUROLOGIC: No numbness, tingling, or ataxia. No seizure-type activity.  PSYCHIATRIC: No anxiety. No insomnia. No ADD.   DRUG ALLERGIES:   Allergies  Allergen Reactions  . Prednisone Other (See Comments)    Reaction: "organs shutting down"    VITALS:  Blood pressure (!) 116/57, pulse 72, temperature (!) 97.4 F (36.3 C), temperature source Oral, resp. rate 20, height 5\' 3"  (1.6 m), weight 75.5 kg, SpO2 100 %.  PHYSICAL EXAMINATION:   Physical Exam  GENERAL:  77 y.o.-year-old patient lying in the bed with no acute distress.  EYES: Pupils equal, round, reactive to light and accommodation. No scleral icterus. Extraocular muscles intact.  HEENT: Head atraumatic, normocephalic. Oropharynx and  nasopharynx clear.  NECK:  Supple, no jugular venous distention. No thyroid enlargement, no tenderness.  LUNGS : Improved breath sounds bilaterally, bibasal basal crepitations heard. No use of accessory muscles of respiration.  CARDIOVASCULAR: S1, S2 irregular. No murmurs, rubs, or gallops.  ABDOMEN: Soft, nontender, nondistended. Bowel sounds present. No organomegaly or mass.  EXTREMITIES: No cyanosis, clubbing  1+ edema b/l.    NEUROLOGIC: Cranial nerves II through XII are intact. No focal Motor or sensory deficits b/l.   PSYCHIATRIC: The patient is alert and oriented x 3.  SKIN: No obvious rash, lesion, or ulcer.   LABORATORY PANEL:   CBC Recent Labs  Lab 10/29/18 0733  WBC 1.7*  HGB 8.3*  HCT 25.1*  PLT 80*   ------------------------------------------------------------------------------------------------------------------ Chemistries  Recent Labs  Lab 10/28/18 0557 10/29/18 0733  NA 132*  --   K 3.9  --   CL 93*  --   CO2 29  --   GLUCOSE 103*  --   BUN 26*  --   CREATININE 0.85  --   CALCIUM 8.0*  --   MG  --  1.7  AST 54*  --   ALT 339*  --   ALKPHOS 92  --   BILITOT 2.9*  --    ------------------------------------------------------------------------------------------------------------------  Cardiac Enzymes No results for input(s): TROPONINI in the last 168 hours. ------------------------------------------------------------------------------------------------------------------  RADIOLOGY:  No results found.   ASSESSMENT AND PLAN:   77 year old male patient with history of atrial fibrillation, aortic valve regurgitation, Barrett's esophagus, stage III lung cancer, chronic congestive heart failure, COPD, GERD, gout, hyperlipidemia, hypertension currently in ICU  -Leukopenia severe/neutropenia ANC  count is 0.7 Target is above 1 Continue subcutaneous injections Granix 480 mcg daily Total WBC count is 1.7 Probably from radiation therapy versus bone  marrow ischemia Hematology follow-up appreciated No evidence of any fever  -Acute ischemic hepatitis resolved Liver enzymes trended down smoothly Gastroenterology follow-up appreciated Avoid hepatotoxic drugs  -Subtherapeutic INR  Coumadin coagulopathy resolved Resumed Coumadin with INR goal of 2-3  -Acute hypomagnesemia Replace magnesium orally  -Right-sided heart failure with congestive hepatopathy with history of aortic valve replacement Medical management to continue  -Chronic kidney disease stage III Appreciate nephrology evaluation Monitor renal function  -Stage III lung cancer Status post oncology evaluation Further chemotherapy deferred in view of poor cardiopulmonary status by oncology  -Atrial fibrillation with rapid rate Status post cardiology evaluation Amiodarone discontinued due to hepatocellular failure Increased metoprolol to 50 mg every 12 hourly Maintain INR between 2 and 3 Oral Cardizem 30 mg every 8 hourly  -Hepatitis A positive Recommended and counseled good hand and oral hygiene Supportive care Good nutrition  -Ambulatory dysfunction and gait instability Status post physical therapy evaluation SNF placement once clinically stable and WBC and ANC count improves [facility]  All the records are reviewed and case discussed with Care Management/Social Worker. Management plans discussed with the patient, family and they are in agreement.  CODE STATUS: Full code  DVT Prophylaxis: SCDs  TOTAL TIME TAKING CARE OF THIS PATIENT: 32 minutes.   POSSIBLE D/C IN 1 DAYS, DEPENDING ON CLINICAL CONDITION.  Saundra Shelling M.D on 10/29/2018 at 1:24 PM  Between 7am to 6pm - Pager - [phone]  After 6pm go to [email or web address] - password [email or web address] [facility]  Office  [phone]  CC: Primary care physician; Guadalupe Maple, MD  Note: This dictation was prepared with Dragon dictation along with  smaller phrase technology. Any transcriptional errors that result from this process are unintentional.

## 2018-10-29 NOTE — Progress Notes (Signed)
Hematology/Oncology Progress Note [facility] Telephone:([phone] [phone]  Patient Care Team: Guadalupe Maple, MD as PCP - General (Family Medicine) Murlean Iba, MD (Internal Medicine) Isaias Cowman, MD as Consulting Physician (Cardiology) Christene Lye, MD (General Surgery) Telford Nab, RN as Registered Nurse Knox Royalty, RN as [employer]   Name of the patient: Kirk Jones  [id number]  1941/08/08  Date of visit: 10/29/18   INTERVAL HISTORY-  Patient was seen and evaluated at bedside.   Patient sitting the chair.  Breathing comfortably via nasal cannula oxygen.  Denies any chest pain, dysuria, fever or chills.  No bone pain. Wife at bedside.  Review of systems- Review of Systems  Constitutional: Positive for fatigue. Negative for appetite change, chills, fever and unexpected weight change.  HENT:   Negative for hearing loss, sore throat and voice change.   Eyes: Negative for eye problems and icterus.  Respiratory: Negative for chest tightness and cough.   Cardiovascular: Positive for leg swelling. Negative for chest pain.  Gastrointestinal: Negative for abdominal distention and abdominal pain.  Endocrine: Negative for hot flashes.  Genitourinary: Negative for difficulty urinating, dysuria and frequency.   Musculoskeletal: Negative for arthralgias.  Skin: Negative for itching and rash.  Neurological: Negative for headaches.  Psychiatric/Behavioral: Negative for confusion.    Allergies  Allergen Reactions  . Prednisone Other (See Comments)    Reaction: "organs shutting down"    Patient Active Problem List   Diagnosis Date Noted  . Chemotherapy-induced neutropenia ([place])   . Thrombocytopenia ([place])   . Unstageable pressure ulcer of sacral region ([place]) 10/25/2018  . Generalized weakness   . Acute on chronic congestive heart failure ([place])   . Transaminasemia 10/21/2018  .  Hyperkalemia   . Supratherapeutic INR   . Palliative care encounter   . Syncope 10/12/2018  . Pressure injury of skin 10/12/2018  . Squamous cell carcinoma of right lung ([place]) 09/16/2018  . Lung mass 08/22/2018  . Exertional shortness of breath 06/07/2018  . Peripheral edema 06/07/2018  . Allergic rhinitis 02/12/2018  . Advanced care planning/counseling discussion 10/19/2017  . Expressive aphasia 06/13/2017  . Hx of adenomatous colonic polyps 02/18/2017  . Bilateral cataracts 08/18/2016  . CAD (coronary artery disease) 10/06/2015  . Pacemaker 10/06/2015  . Automatic implantable cardioverter-defibrillator in situ 10/06/2015  . Nodular prostate with urinary obstruction 10/06/2015  . Senile purpura ([place]) 10/06/2015  . Barrett's esophagus 05/28/2015  . Hypertension 05/28/2015  . COPD (chronic obstructive pulmonary disease) ([place]) 05/28/2015  . Hyperlipidemia 05/28/2015  . Diverticulosis 05/28/2015  . CKD (chronic kidney disease), stage III ([place]) 05/28/2015  . Gout 05/28/2015  . History of prosthetic heart valve 05/13/2015  . Atrial fibrillation ([place]) 05/13/2015  . CVA (cerebral vascular accident) ([place]) 07/05/2014  . Systolic congestive heart failure ([place]) 07/05/2014  . History of TIA (transient ischemic attack) 02/12/2013  . Warfarin anticoagulation 02/12/2013     Past Medical History:  Diagnosis Date  . AICD (automatic cardioverter/defibrillator) present   . Aortic valve regurgitation   . Atrial fibrillation ([place])   . Barrett's esophagus   . Cancer ([place])    sate III lung  . CHF (congestive heart failure) ([place])   . COPD (chronic obstructive pulmonary disease) ([place])   . Diverticulosis   . Dysrhythmia   . GERD (gastroesophageal reflux disease)   . Gout   . Headache    aura only  . Heart murmur   . Hematuria   .  Hernia of abdominal cavity   . History of prosthetic heart valve   . Hyperlipidemia   . Hypertension   . Nodular prostate with urinary obstruction   .  Presence of permanent cardiac pacemaker   . Renal insufficiency   . Substance abuse ([place])    alcohol     Past Surgical History:  Procedure Laterality Date  . CARDIAC VALVE REPLACEMENT N/A 2004  . CARPAL TUNNEL RELEASE Right 2015  . CATARACT EXTRACTION Right 2017  . COLONOSCOPY WITH PROPOFOL N/A 2014  . CORONARY ANGIOPLASTY     stints x2  . EYE SURGERY     cross eyed  . FOOT SURGERY Bilateral    [place]  . FRACTURE SURGERY Right    [place]  . INGUINAL HERNIA REPAIR Right 09/16/2017   Procedure: HERNIA REPAIR INGUINAL ADULT;  Surgeon: Christene Lye, MD;  Location: [facility] ORS;  Service: General;  Laterality: Right;  . INSERT / REPLACE / [place]     ICD  . PACEMAKER IMPLANT Left 2013   and ICD  . PORTA CATH INSERTION N/A 09/21/2018   Procedure: PORTA CATH INSERTION;  Surgeon: Algernon Huxley, MD;  Location: [facility];  Service: Cardiovascular;  Laterality: N/A;  . VASECTOMY      Social History   Socioeconomic History  . Marital status: Married    Spouse name: Belenda Cruise   . Number of children: 5  . Years of education: Not on file  . Highest education level: Not on file  Occupational History  . Not on file  Social Needs  . Financial resource strain: Not hard at all  . Food insecurity:    Worry: Never true    Inability: Never true  . Transportation needs:    Medical: No    Non-medical: No  Tobacco Use  . Smoking status: Former Smoker    Types: Cigarettes    Last attempt to quit: 04/24/1991    Years since quitting: 27.5  . Smokeless tobacco: Never Used  Substance and Sexual Activity  . Alcohol use: Yes    Alcohol/week: 2.0 standard drinks    Types: 2 Shots of liquor per week    Comment: quit December 2015/restarted-2 drinks per night  . Drug use: No  . Sexual activity: Never  Lifestyle  . Physical activity:    Days per week: 0 days    Minutes per session: 0 min  . Stress: Not at all  Relationships  . Social connections:    Talks on  phone: Once a week    Gets together: Once a week    Attends religious service: More than 4 times per year    Active member of club or organization: No    Attends meetings of clubs or organizations: Never    Relationship status: Married  . Intimate partner violence:    Fear of current or ex partner: No    Emotionally abused: No    Physically abused: No    Forced sexual activity: No  Other Topics Concern  . Not on file  Social History Narrative  . Not on file     Family History  Problem Relation Age of Onset  . Heart attack Mother   . Diabetes Maternal Grandmother   . Lung cancer Sister      Current Facility-Administered Medications:  .  bisacodyl (DULCOLAX) EC tablet 5 mg, 5 mg, Oral, Daily PRN, Arta Silence, MD, 5 mg at 10/24/18 0848 .  diltiazem (CARDIZEM) tablet 30 mg,  30 mg, Oral, Q8H, Pyreddy, Pavan, MD, 30 mg at 10/29/18 0659 .  finasteride (PROSCAR) tablet 5 mg, 5 mg, Oral, Daily, Jodell Cipro, Prasanna, MD, 5 mg at 10/29/18 1102 .  guaiFENesin (MUCINEX) 12 hr tablet 600 mg, 600 mg, Oral, BID, Pyreddy, Pavan, MD, 600 mg at 10/29/18 1101 .  guaiFENesin-dextromethorphan (ROBITUSSIN DM) 100-10 MG/5ML syrup 5 mL, 5 mL, Oral, Q4H PRN, Pyreddy, Pavan, MD .  loratadine (CLARITIN) tablet 10 mg, 10 mg, Oral, Daily, Earlie Server, MD, 10 mg at 10/29/18 1102 .  metoprolol tartrate (LOPRESSOR) tablet 50 mg, 50 mg, Oral, BID, Pyreddy, Pavan, MD, 50 mg at 10/28/18 2231 .  pantoprazole (PROTONIX) EC tablet 40 mg, 40 mg, Oral, Daily, Jodell Cipro, Prasanna, MD, 40 mg at 10/29/18 1101 .  senna-docusate (Senokot-S) tablet 1 tablet, 1 tablet, Oral, QHS PRN, Arta Silence, MD .  Tbo-Filgrastim [facility]) injection 480 mcg, 480 mcg, Subcutaneous, q1800, Pyreddy, Pavan, MD, 480 mcg at 10/28/18 1801 .  tiotropium (SPIRIVA) inhalation capsule ([facility] use ONLY) 18 mcg, 18 mcg, Inhalation, Daily, Pyreddy, Pavan, MD, 18 mcg at 10/29/18 1304 .  warfarin (COUMADIN) tablet 3 mg, 3 mg, Oral, q1800,  Saundra Shelling, MD .  Warfarin - Pharmacist Dosing Inpatient, , Does not apply, q1800, Saundra Shelling, MD  Facility-Administered Medications Ordered in Other Encounters:  .  0.9 %  sodium chloride infusion, , Intravenous, Continuous, Ascencion Dike, [place]   Physical exam:  Vitals:   10/29/18 0655 10/29/18 0700 10/29/18 1100 10/29/18 1222  BP: 104/62  91/61 (!) 116/57  Pulse: 87  95 72  Resp:   20 20  Temp:    (!) 97.4 F (36.3 C)  TempSrc:    Oral  SpO2: 98%  97% 100%  Weight:  166 lb 8 oz (75.5 kg)    Height:       Physical Exam  Constitutional: He is oriented to person, place, and time. No distress.  HENT:  Head: Normocephalic and atraumatic.  Nose: Nose normal.  Mouth/Throat: Oropharynx is clear and moist. No oropharyngeal exudate.  Eyes: Pupils are equal, round, and reactive to light. EOM are normal. No scleral icterus.  Neck: Normal range of motion. Neck supple.  Cardiovascular: Normal rate and regular rhythm.  No murmur heard. irregular  Pulmonary/Chest: Effort normal. No respiratory distress. He has no rales. He exhibits no tenderness.  Decreased breath sound bilaterally.  Worse on the right side.  Abdominal: Soft. He exhibits no distension. There is no tenderness.  Musculoskeletal: Normal range of motion. He exhibits edema.  Bilateral lower extremity swelling.  Pitting edema, 3+  Neurological: He is alert and oriented to person, place, and time. No cranial nerve deficit. He exhibits normal muscle tone. Coordination normal.  Skin: Skin is warm and dry. He is not diaphoretic. No erythema.  Psychiatric: Affect normal.       CMP Latest Ref Rng & Units 10/28/2018  Glucose 70 - 99 mg/dL 103(H)  BUN 8 - 23 mg/dL 26(H)  Creatinine 0.61 - 1.24 mg/dL 0.85  Sodium 135 - 145 mmol/L 132(L)  Potassium 3.5 - 5.1 mmol/L 3.9  Chloride 98 - 111 mmol/L 93(L)  CO2 22 - 32 mmol/L 29  Calcium 8.9 - 10.3 mg/dL 8.0(L)  Total Protein 6.5 - 8.1 g/dL 4.8(L)  Total Bilirubin 0.3 - 1.2  mg/dL 2.9(H)  Alkaline Phos 38 - 126 U/L 92  AST 15 - 41 U/L 54(H)  ALT 0 - 44 U/L 339(H)   CBC Latest Ref Rng & Units 10/29/2018  WBC 4.0 -  10.5 K/uL 1.7(L)  Hemoglobin 13.0 - 17.0 g/dL 8.3(L)  Hematocrit 39.0 - 52.0 % 25.1(L)  Platelets 150 - 400 K/uL 80(L)   RADIOGRAPHIC STUDIES: I have personally reviewed the radiological images as listed and agreed with the findings in the report. Dg Chest 1 View  Result Date: 10/21/2018 CLINICAL DATA:  77 y/o  M; aspiration into the respiratory tract. EXAM: CHEST  1 VIEW COMPARISON:  10/21/2018 chest CT and chest radiograph. FINDINGS: Stable right basilar opacity and right-sided pleural effusion. Interstitial pulmonary edema. Right port catheter tip projects over mid SVC. Stable cardiac silhouette given projection and technique. Single lead AICD. Post median sternotomy with wires in alignment. Calcific aortic atherosclerosis. No acute osseous abnormality is evident. IMPRESSION: Stable right basilar opacity corresponding to mass on CT and right-sided pleural effusion. Interstitial pulmonary edema. Aortic Atherosclerosis (ICD10-I70.0). Electronically Signed   By: Kristine Garbe M.D.   On: 10/21/2018 20:23   Ct Head Wo Contrast  Result Date: 10/12/2018 CLINICAL DATA:  Acute onset of dizziness and fall. Hit head on headboard. Laceration at the right temple. Patient on Coumadin. Initial encounter. EXAM: CT HEAD WITHOUT CONTRAST TECHNIQUE: Contiguous axial images were obtained from the base of the skull through the vertex without intravenous contrast. COMPARISON:  CT of the head performed 09/04/2018 FINDINGS: Brain: No evidence of acute infarction, hemorrhage, hydrocephalus, extra-axial collection or mass lesion / mass effect. Prominence of the ventricles and sulci reflects mild to moderate cortical volume loss. Cerebellar atrophy is noted. Scattered periventricular and subcortical white matter change likely reflects small vessel ischemic  microangiopathy. A small chronic lacunar infarct is noted at the left cerebellar hemisphere. The brainstem and fourth ventricle are within normal limits. The basal ganglia are unremarkable in appearance. The cerebral hemispheres demonstrate grossly normal gray-white differentiation. No mass effect or midline shift is seen. Vascular: No hyperdense vessel or unexpected calcification. Skull: There is no evidence of fracture; visualized osseous structures are unremarkable in appearance. Sinuses/Orbits: The visualized portions of the orbits are within normal limits. The paranasal sinuses and mastoid air cells are well-aerated. Other: A prominent soft tissue laceration is noted anterior to the right ear, with soft tissue air extending below the right ear. IMPRESSION: 1. No evidence of traumatic intracranial injury or fracture. 2. Prominent soft tissue laceration anterior to the right ear, with soft tissue air extending below the right ear. 3. Mild to moderate cortical volume loss and scattered small vessel ischemic microangiopathy. 4. Small chronic lacunar infarct at the left cerebellar hemisphere. Electronically Signed   By: Garald Balding M.D.   On: 10/12/2018 00:18   Ct Chest Wo Contrast  Result Date: 10/21/2018 CLINICAL DATA:  Shortness of breath and weakness. EXAM: CT CHEST WITHOUT CONTRAST TECHNIQUE: Multidetector CT imaging of the chest was performed following the standard protocol without IV contrast. COMPARISON:  08/21/2018 FINDINGS: Cardiovascular: Evaluation of vascular structures is limited without IV contrast material. Cardiac pacemaker. Diffuse cardiac enlargement. No pericardial effusions. Coronary artery calcifications. Postoperative changes in the mediastinum. Calcification throughout the aorta. Mediastinum/Nodes: Moderate prominent lymph nodes throughout the mediastinum without change since prior study, likely reactive. Lungs/Pleura: Bilateral pleural effusions, greater on the right. Atelectasis in  the lung bases. Known right lower lobe mass is visible but somewhat obscured by the effusion and atelectasis. Emphysematous changes in the lungs. No pneumothorax. Airways are patent. Upper Abdomen: Probable hepatic cirrhosis. Free fluid in the right upper quadrant likely represents ascites. Musculoskeletal: Degenerative changes in the spine. No destructive bone lesions. Postoperative sternotomy wires. IMPRESSION:  Known mass in the right lung base. Cardiac enlargement. Bilateral pleural effusions with basilar atelectasis, greater on the right. Mild progression on the right since previous study. Hepatic cirrhosis with upper abdominal ascites. Electronically Signed   By: Lucienne Capers M.D.   On: 10/21/2018 06:53   Dg Hand 2 View Left  Result Date: 10/12/2018 CLINICAL DATA:  Pt reports he had a fall last night and is now experiencing pain in his left hand. Pt hand appears swollen and red at this time EXAM: LEFT HAND - 2 VIEW COMPARISON:  None. FINDINGS: IV tubing overlies the wrist. There are degenerative changes insert wrist, primarily along the radial aspect of the carpals and the first carpometacarpal joint. There is degenerative change at the second metacarpophalangeal joint. No acute fracture or subluxation. Atherosclerotic calcification of the small arteries. IMPRESSION: No evidence for acute  abnormality.  Degenerative changes. Electronically Signed   By: Nolon Nations M.D.   On: 10/12/2018 15:27   US Venous Img Upper Uni Left  Result Date: 10/15/2018 CLINICAL DATA:  Left arm swelling. Recent fall. Left cardiac ICD. Edema from the elbow to the wrist. EXAM: LEFT UPPER EXTREMITY VENOUS DOPPLER ULTRASOUND TECHNIQUE: Gray-scale sonography with graded compression, as well as color Doppler and duplex ultrasound were performed to evaluate the upper extremity deep venous system from the level of the subclavian vein and including the jugular, axillary, basilic, radial, ulnar and upper cephalic vein.  Spectral Doppler was utilized to evaluate flow at rest and with distal augmentation maneuvers. COMPARISON:  Chest radiograph 10/11/2018 FINDINGS: Contralateral Subclavian Vein: Normal respiratory phasicity. Normal color Doppler flow in the right subclavian vein. No evidence for right subclavian thrombus. Internal Jugular Vein: No evidence of thrombus. Normal compressibility, respiratory phasicity and response to augmentation. Subclavian Vein: The left cardiac ICD lead is noted. There appears to be flow around the lead and left subclavian vein appears to be patent without thrombus. Axillary Vein: No evidence of thrombus. Normal compressibility, respiratory phasicity and response to augmentation. Cephalic Vein: No evidence of thrombus. Normal compressibility, respiratory phasicity and response to augmentation. Basilic Vein: No evidence of thrombus. Normal compressibility, respiratory phasicity and response to augmentation. Brachial Veins: No evidence of thrombus. Normal compressibility, respiratory phasicity and response to augmentation. Radial Veins: No evidence of thrombus. Normal compressibility, respiratory phasicity and response to augmentation. Single radial vein is identified. Ulnar Veins: No evidence of thrombus. Normal compressibility, respiratory phasicity and response to augmentation. Single ulnar vein is identified. Venous Reflux:  None visualized. Other Findings:  Subcutaneous edema. IMPRESSION: No evidence of DVT within the left upper extremity. Limited evaluation of the left subclavian vein due to the cardiac ICD but no evidence for thrombus in this area. Electronically Signed   By: Markus Daft M.D.   On: 10/15/2018 14:58   Dg Chest Port 1 View  Result Date: 10/21/2018 CLINICAL DATA:  Shortness of breath. EXAM: PORTABLE CHEST 1 VIEW COMPARISON:  Chest x-rays dated 10/11/2018 and 08/10/2018. FINDINGS: Increased opacity at the RIGHT lung base. Probable mild atelectasis and/or small pleural effusion at  the LEFT lung base. No pneumothorax seen. No acute or suspicious osseous finding. Stable cardiomegaly. RIGHT chest wall Port-A-Cath appears stable in position with tip at the level of the mid/lower SVC. LEFT chest wall pacemaker/ICD apparatus is stable in position. Median sternotomy wires appear intact and stable in alignment. IMPRESSION: 1. Increasing opacity at the RIGHT lung base. This is mostly related to the RIGHT lower lobe mass identified on recent chest CT (08/21/2018) and  PET-CT (08/30/2018), likely with superimposed pleural effusion and/or atelectasis that has slightly increased in the interval. 2. Stable cardiomegaly. 3. Probable mild atelectasis and/or small pleural effusion at the LEFT lung base. Electronically Signed   By: Franki Cabot M.D.   On: 10/21/2018 00:32   Dg Chest Port 1 View  Result Date: 10/12/2018 CLINICAL DATA:  Status post fall, with concern for chest injury. Initial encounter. EXAM: PORTABLE CHEST 1 VIEW COMPARISON:  CT of the chest performed 08/21/2018 FINDINGS: There is a persistent small right-sided pleural effusion. The known right lower lobe mass is better characterized on prior CT. There is suggestion of underlying interstitial edema, which may be transient in nature. No pneumothorax is seen. The cardiomediastinal silhouette is borderline normal in size. The patient is status post median sternotomy. An AICD is noted overlying the left chest wall, with a single lead ending overlying the right ventricle. The patient's right chest port is noted ending about the mid SVC. No displaced rib fractures are seen. IMPRESSION: 1. Persistent small right-sided pleural effusion. Known right lower lobe mass is better characterized on prior CT. Suggestion of underlying interstitial edema, which may be transient in nature. 2. No displaced rib fracture seen. Electronically Signed   By: Garald Balding M.D.   On: 10/12/2018 00:22   US Liver Doppler  Result Date: 10/21/2018 CLINICAL DATA:   Abnormal transaminases. Patient undergoing chemotherapy and radiation for stage III lung cancer. EXAM: DUPLEX ULTRASOUND OF LIVER TECHNIQUE: Color and duplex Doppler ultrasound was performed to evaluate the hepatic in-flow and out-flow vessels. COMPARISON:  CT abdomen 09/25/2018 FINDINGS: Gallbladder: The gallbladder is not definitively identified. In the gallbladder fossa, there is a structure which is could represent a severely contracted gallbladder. No significant gallbladder lumen is visualized. No stones are identified. Murphy's sign is negative. Common bile duct: Diameter: 4.1 mm, normal Liver: normal parenchymal echogenicity. Normal hepatic contour without nodularity. No focal lesion, mass or intrahepatic biliary ductal dilatation. Portal Vein Velocities Main:  29 cm/sec Right:  39 cm/sec Left:  116 cm/sec Main portal vein demonstrates bidirectional flow with intermittent hepatopetal and hepatofugal flow direction. Right and left portal veins demonstrate normal hepatopetal flow direction. Hepatic Vein Velocities Right:  32 cm/sec Middle:  56 cm/sec Left:  30 cm/sec Normal flow direction demonstrated in the hepatic veins. IVC: Present and patent with normal respiratory phasicity. Velocity measures 36 centimeters/second. Hepatic Artery Velocity:  130/32 cm/sec Splenic Vein Velocity:  48 cm/sec Varices: Not identified. Ascites: Minimal fluid over the liver edge. Incidental note of small right pleural effusion. IMPRESSION: 1. Gallbladder is markedly contracted, likely to be physiologic but nonspecific. No stones identified. 2. Normal flow velocities demonstrated in the portal and hepatic veins. Bidirectional flow is demonstrated in the main portal vein. Bidirectional flow may be due to Valsalva or early portal hypertension. 3. Right pleural effusion and small amount of ascites. Electronically Signed   By: Lucienne Capers M.D.   On: 10/21/2018 05:26   US Abdomen Limited Ruq  Result Date: 10/21/2018 CLINICAL  DATA:  Abnormal transaminases. Patient undergoing chemotherapy and radiation for stage III lung cancer. EXAM: DUPLEX ULTRASOUND OF LIVER TECHNIQUE: Color and duplex Doppler ultrasound was performed to evaluate the hepatic in-flow and out-flow vessels. COMPARISON:  CT abdomen 09/25/2018 FINDINGS: Gallbladder: The gallbladder is not definitively identified. In the gallbladder fossa, there is a structure which is could represent a severely contracted gallbladder. No significant gallbladder lumen is visualized. No stones are identified. Murphy's sign is negative. Common bile duct: Diameter:  4.1 mm, normal Liver: normal parenchymal echogenicity. Normal hepatic contour without nodularity. No focal lesion, mass or intrahepatic biliary ductal dilatation. Portal Vein Velocities Main:  29 cm/sec Right:  39 cm/sec Left:  116 cm/sec Main portal vein demonstrates bidirectional flow with intermittent hepatopetal and hepatofugal flow direction. Right and left portal veins demonstrate normal hepatopetal flow direction. Hepatic Vein Velocities Right:  32 cm/sec Middle:  56 cm/sec Left:  30 cm/sec Normal flow direction demonstrated in the hepatic veins. IVC: Present and patent with normal respiratory phasicity. Velocity measures 36 centimeters/second. Hepatic Artery Velocity:  130/32 cm/sec Splenic Vein Velocity:  48 cm/sec Varices: Not identified. Ascites: Minimal fluid over the liver edge. Incidental note of small right pleural effusion. IMPRESSION: 1. Gallbladder is markedly contracted, likely to be physiologic but nonspecific. No stones identified. 2. Normal flow velocities demonstrated in the portal and hepatic veins. Bidirectional flow is demonstrated in the main portal vein. Bidirectional flow may be due to Valsalva or early portal hypertension. 3. Right pleural effusion and small amount of ascites. Electronically Signed   By: Lucienne Capers M.D.   On: 10/21/2018 05:26    Assessment and plan-  Patient is a 77 y.o. male  with multiple comorbidities, recent diagnosis of stage III lung cancer, recently on concurrent chemo and radiation. Last chemotherapy believed to be on 10/19/2018 currently admitted due to hyponatremia, hypotension, ischemia hepatitis, CHF  #Neutropenia, on G-CSF with Granix, received 2 doses. Differential was added to today's lab. Labs reviewed and discussed with patient.  ANC improved 2.7.  Total white count improved to 1.7. Continue Granix until [place] above 1. Of prophylactic antibiotics Cipro due to concern of QT prolongation.  #Anemia and thrombocytopenia due to chemotherapy.  Stable.  Thank you for allowing me to participate in the care of this patient.   Earlie Server, MD, PhD Hematology Oncology [facility] Pager- [phone] 10/29/2018

## 2018-10-29 NOTE — Plan of Care (Signed)

## 2018-10-30 ENCOUNTER — Ambulatory Visit: Payer: Medicare Other

## 2018-10-30 DIAGNOSIS — J9602 Acute respiratory failure with hypercapnia: Secondary | ICD-10-CM | POA: Diagnosis present

## 2018-10-30 DIAGNOSIS — Z7951 Long term (current) use of inhaled steroids: Secondary | ICD-10-CM | POA: Diagnosis not present

## 2018-10-30 DIAGNOSIS — C3491 Malignant neoplasm of unspecified part of right bronchus or lung: Secondary | ICD-10-CM | POA: Diagnosis not present

## 2018-10-30 DIAGNOSIS — I959 Hypotension, unspecified: Secondary | ICD-10-CM | POA: Diagnosis not present

## 2018-10-30 DIAGNOSIS — R531 Weakness: Secondary | ICD-10-CM | POA: Diagnosis not present

## 2018-10-30 DIAGNOSIS — Z7901 Long term (current) use of anticoagulants: Secondary | ICD-10-CM | POA: Diagnosis not present

## 2018-10-30 DIAGNOSIS — M6281 Muscle weakness (generalized): Secondary | ICD-10-CM | POA: Diagnosis not present

## 2018-10-30 DIAGNOSIS — I509 Heart failure, unspecified: Secondary | ICD-10-CM | POA: Diagnosis not present

## 2018-10-30 DIAGNOSIS — K759 Inflammatory liver disease, unspecified: Secondary | ICD-10-CM | POA: Diagnosis not present

## 2018-10-30 DIAGNOSIS — J440 Chronic obstructive pulmonary disease with acute lower respiratory infection: Secondary | ICD-10-CM | POA: Diagnosis present

## 2018-10-30 DIAGNOSIS — E569 Vitamin deficiency, unspecified: Secondary | ICD-10-CM | POA: Diagnosis not present

## 2018-10-30 DIAGNOSIS — Z923 Personal history of irradiation: Secondary | ICD-10-CM | POA: Diagnosis not present

## 2018-10-30 DIAGNOSIS — R404 Transient alteration of awareness: Secondary | ICD-10-CM | POA: Diagnosis not present

## 2018-10-30 DIAGNOSIS — E785 Hyperlipidemia, unspecified: Secondary | ICD-10-CM | POA: Diagnosis present

## 2018-10-30 DIAGNOSIS — N183 Chronic kidney disease, stage 3 (moderate): Secondary | ICD-10-CM | POA: Diagnosis present

## 2018-10-30 DIAGNOSIS — M2550 Pain in unspecified joint: Secondary | ICD-10-CM | POA: Diagnosis not present

## 2018-10-30 DIAGNOSIS — Z66 Do not resuscitate: Secondary | ICD-10-CM | POA: Diagnosis present

## 2018-10-30 DIAGNOSIS — R609 Edema, unspecified: Secondary | ICD-10-CM | POA: Diagnosis not present

## 2018-10-30 DIAGNOSIS — Z7401 Bed confinement status: Secondary | ICD-10-CM | POA: Diagnosis not present

## 2018-10-30 DIAGNOSIS — I10 Essential (primary) hypertension: Secondary | ICD-10-CM | POA: Diagnosis not present

## 2018-10-30 DIAGNOSIS — R498 Other voice and resonance disorders: Secondary | ICD-10-CM | POA: Diagnosis not present

## 2018-10-30 DIAGNOSIS — E611 Iron deficiency: Secondary | ICD-10-CM | POA: Diagnosis present

## 2018-10-30 DIAGNOSIS — Z515 Encounter for palliative care: Secondary | ICD-10-CM | POA: Diagnosis present

## 2018-10-30 DIAGNOSIS — Z794 Long term (current) use of insulin: Secondary | ICD-10-CM | POA: Diagnosis not present

## 2018-10-30 DIAGNOSIS — K769 Liver disease, unspecified: Secondary | ICD-10-CM | POA: Diagnosis not present

## 2018-10-30 DIAGNOSIS — I4891 Unspecified atrial fibrillation: Secondary | ICD-10-CM | POA: Diagnosis not present

## 2018-10-30 DIAGNOSIS — R630 Anorexia: Secondary | ICD-10-CM | POA: Diagnosis not present

## 2018-10-30 DIAGNOSIS — R791 Abnormal coagulation profile: Secondary | ICD-10-CM | POA: Diagnosis not present

## 2018-10-30 DIAGNOSIS — R4182 Altered mental status, unspecified: Secondary | ICD-10-CM | POA: Diagnosis not present

## 2018-10-30 DIAGNOSIS — Z7189 Other specified counseling: Secondary | ICD-10-CM | POA: Diagnosis not present

## 2018-10-30 DIAGNOSIS — C3490 Malignant neoplasm of unspecified part of unspecified bronchus or lung: Secondary | ICD-10-CM | POA: Diagnosis present

## 2018-10-30 DIAGNOSIS — K7290 Hepatic failure, unspecified without coma: Secondary | ICD-10-CM | POA: Diagnosis not present

## 2018-10-30 DIAGNOSIS — R740 Nonspecific elevation of levels of transaminase and lactic acid dehydrogenase [LDH]: Secondary | ICD-10-CM | POA: Diagnosis not present

## 2018-10-30 DIAGNOSIS — I5023 Acute on chronic systolic (congestive) heart failure: Secondary | ICD-10-CM | POA: Diagnosis present

## 2018-10-30 DIAGNOSIS — R41841 Cognitive communication deficit: Secondary | ICD-10-CM | POA: Diagnosis not present

## 2018-10-30 DIAGNOSIS — Z9221 Personal history of antineoplastic chemotherapy: Secondary | ICD-10-CM | POA: Diagnosis not present

## 2018-10-30 DIAGNOSIS — R0689 Other abnormalities of breathing: Secondary | ICD-10-CM | POA: Diagnosis not present

## 2018-10-30 DIAGNOSIS — J449 Chronic obstructive pulmonary disease, unspecified: Secondary | ICD-10-CM | POA: Diagnosis not present

## 2018-10-30 DIAGNOSIS — I5020 Unspecified systolic (congestive) heart failure: Secondary | ICD-10-CM | POA: Diagnosis not present

## 2018-10-30 DIAGNOSIS — R0602 Shortness of breath: Secondary | ICD-10-CM | POA: Diagnosis not present

## 2018-10-30 DIAGNOSIS — Z87891 Personal history of nicotine dependence: Secondary | ICD-10-CM | POA: Diagnosis not present

## 2018-10-30 DIAGNOSIS — Z952 Presence of prosthetic heart valve: Secondary | ICD-10-CM | POA: Diagnosis not present

## 2018-10-30 DIAGNOSIS — Z95810 Presence of automatic (implantable) cardiac defibrillator: Secondary | ICD-10-CM | POA: Diagnosis not present

## 2018-10-30 DIAGNOSIS — J9811 Atelectasis: Secondary | ICD-10-CM | POA: Diagnosis present

## 2018-10-30 DIAGNOSIS — K219 Gastro-esophageal reflux disease without esophagitis: Secondary | ICD-10-CM | POA: Diagnosis not present

## 2018-10-30 DIAGNOSIS — Z7902 Long term (current) use of antithrombotics/antiplatelets: Secondary | ICD-10-CM | POA: Diagnosis not present

## 2018-10-30 DIAGNOSIS — Z955 Presence of coronary angioplasty implant and graft: Secondary | ICD-10-CM | POA: Diagnosis not present

## 2018-10-30 DIAGNOSIS — I130 Hypertensive heart and chronic kidney disease with heart failure and stage 1 through stage 4 chronic kidney disease, or unspecified chronic kidney disease: Secondary | ICD-10-CM | POA: Diagnosis not present

## 2018-10-30 DIAGNOSIS — N179 Acute kidney failure, unspecified: Secondary | ICD-10-CM | POA: Diagnosis not present

## 2018-10-30 DIAGNOSIS — R0609 Other forms of dyspnea: Secondary | ICD-10-CM | POA: Diagnosis not present

## 2018-10-30 DIAGNOSIS — D291 Benign neoplasm of prostate: Secondary | ICD-10-CM | POA: Diagnosis not present

## 2018-10-30 DIAGNOSIS — R0902 Hypoxemia: Secondary | ICD-10-CM | POA: Diagnosis not present

## 2018-10-30 DIAGNOSIS — J9601 Acute respiratory failure with hypoxia: Secondary | ICD-10-CM | POA: Diagnosis not present

## 2018-10-30 LAB — CBC WITH DIFFERENTIAL/PLATELET
Abs Immature Granulocytes: 0.69 10*3/uL — ABNORMAL HIGH (ref 0.00–0.07)
Basophils Absolute: 0.1 10*3/uL (ref 0.0–0.1)
Basophils Relative: 1 %
EOS ABS: 0 10*3/uL (ref 0.0–0.5)
EOS PCT: 0 %
HCT: 25 % — ABNORMAL LOW (ref 39.0–52.0)
Hemoglobin: 8.4 g/dL — ABNORMAL LOW (ref 13.0–17.0)
Immature Granulocytes: 9 %
Lymphocytes Relative: 3 %
Lymphs Abs: 0.2 10*3/uL — ABNORMAL LOW (ref 0.7–4.0)
MCH: 30.8 pg (ref 26.0–34.0)
MCHC: 33.6 g/dL (ref 30.0–36.0)
MCV: 91.6 fL (ref 80.0–100.0)
Monocytes Absolute: 1 10*3/uL (ref 0.1–1.0)
Monocytes Relative: 13 %
Neutro Abs: 5.9 10*3/uL (ref 1.7–7.7)
Neutrophils Relative %: 74 %
Platelets: 78 10*3/uL — ABNORMAL LOW (ref 150–400)
RBC: 2.73 MIL/uL — ABNORMAL LOW (ref 4.22–5.81)
RDW: 17.2 % — ABNORMAL HIGH (ref 11.5–15.5)
Smear Review: NORMAL
WBC Morphology: INCREASED
WBC: 7.9 10*3/uL (ref 4.0–10.5)
nRBC: 0.6 % — ABNORMAL HIGH (ref 0.0–0.2)

## 2018-10-30 LAB — PROTIME-INR
INR: 2.1
Prothrombin Time: 23.3 seconds — ABNORMAL HIGH (ref 11.4–15.2)

## 2018-10-30 LAB — PATHOLOGIST SMEAR REVIEW

## 2018-10-30 MED ORDER — SENNOSIDES-DOCUSATE SODIUM 8.6-50 MG PO TABS: 1.0000 | ORAL_TABLET | Freq: Every evening | ORAL | 0 refills | Status: DC | PRN

## 2018-10-30 MED ORDER — HEPARIN SOD (PORK) LOCK FLUSH 100 UNIT/ML IV SOLN
INTRAVENOUS | Status: AC
  Filled 2018-10-30: qty 5

## 2018-10-30 MED ORDER — DILTIAZEM HCL ER COATED BEADS 120 MG PO CP24: 120.0000 mg | ORAL_CAPSULE | Freq: Every day | ORAL | 0 refills | Status: DC

## 2018-10-30 MED ORDER — FUROSEMIDE 40 MG PO TABS: 20.0000 mg | ORAL_TABLET | Freq: Every day | ORAL | 0 refills | Status: DC

## 2018-10-30 MED ORDER — METOPROLOL TARTRATE 50 MG PO TABS: 50.0000 mg | ORAL_TABLET | Freq: Two times a day (BID) | ORAL | 0 refills | Status: DC

## 2018-10-30 NOTE — Progress Notes (Signed)
[facility] here to transport [facility] to [facility]. 02 in use. No resp distress. Instructions to pt and wife.  meds diet activity and f./u report called to [facility] to kim rn  . Dressing change at this time to left arm due to weeping of arm.dry /intact.port flushed per protocol good blood return.

## 2018-10-30 NOTE — Discharge Summary (Signed)
[facility] [place] NAME: Kirk Jones    MR#:  [id number]  DATE OF BIRTH:  06/15/1941  DATE OF ADMISSION:  10/11/2018 ADMITTING PHYSICIAN: Harrie Foreman, MD  DATE OF DISCHARGE: 10/15/2018  5:31 PM  PRIMARY CARE PHYSICIAN: Guadalupe Maple, MD   ADMISSION DIAGNOSIS:  Ventricular tachycardia ([place]) [I47.2] Syncope, unspecified syncope type [R55]  DISCHARGE DIAGNOSIS:  Active Problems:   Syncope   Pressure injury of skin   Palliative care encounter   SECONDARY DIAGNOSIS:   Past Medical History:  Diagnosis Date  . AICD (automatic cardioverter/defibrillator) present   . Aortic valve regurgitation   . Atrial fibrillation ([place])   . Barrett's esophagus   . Cancer ([place])    sate III lung  . CHF (congestive heart failure) ([place])   . COPD (chronic obstructive pulmonary disease) ([place])   . Diverticulosis   . Dysrhythmia   . GERD (gastroesophageal reflux disease)   . Gout   . Headache    aura only  . Heart murmur   . Hematuria   . Hernia of abdominal cavity   . History of prosthetic heart valve   . Hyperlipidemia   . Hypertension   . Nodular prostate with urinary obstruction   . Presence of permanent cardiac pacemaker   . Renal insufficiency   . Substance abuse ([place])    alcohol     ADMITTING HISTORY  Chief Complaint: Syncope HPI: Patient with past medical history of lung cancer, COPD, CHF, atrial fibrillation, hypertension and prosthetic aortic valve presents to the emergency department with a head injury following syncope.  The patient recalls standing from a chair and rapidly losing consciousness.  He suspects that he hit his head on some furniture.  CT of his head in the emergency department showed no cute intracranial process.  He received 16 sutures to the wound.  The patient denies chest pain or shortness of breath but some strips in the emergency department did detect multiple PVCs as well as ventricular tachycardia.  The  patient does not recall his AICD firing which gave the treatment team pause for concern and prompted the emergency department staff to call the hospitalist service for admission.  HOSPITAL COURSE:   This is a 77 year old male admitted for syncope  * Syncope due to SVT on pacemaker interrogation. Also has history of ventricular tachycardia. Started on oral Cardizem and amiodarone drip.  Changed to oral amiodarone. Discussed with Dr. Ubaldo Glassing of cardiology. Patient's heart rate well controlled on oral amiodarone and Cardizem.  Metoprolol stopped.   * Chronic combined diastolic and systolic CHF. No significant fluid overload.  * Hyponatremia: Chronic,  secondary to lung cancer.  * Lung cancer stage 3 Status post cycle 2 of carbo/Taxol.  * Diabetes mellitus type 2: Hold oral hypoglycemic agents. Check hemoglobin A1c.Sliding scale insulin while hospitalized.  * Paroxysmal Atrial fibrillation:Continue diltiazem for now. Continue warfarin (status post aortic valve replacement)  * Hypertension: Controlled   Stable to be discharged today.  Follow-up with cardiology.  Home health services set up.  CONSULTS OBTAINED:  Treatment Team:  Teodoro Spray, MD Sindy Guadeloupe, MD  DRUG ALLERGIES:   Allergies  Allergen Reactions  . Prednisone Other (See Comments)    Reaction: "organs shutting down"    DISCHARGE MEDICATIONS:   Allergies as of 10/15/2018      Reactions   Prednisone Other (See Comments)   Reaction: "organs shutting down"      Medication List  STOP taking these medications   diltiazem 180 MG 24 hr capsule Commonly known as:  DILACOR XR   diltiazem 180 MG 24 hr capsule Commonly known as:  CARDIZEM CD   furosemide 40 MG tablet Commonly known as:  LASIX   losartan 25 MG tablet Commonly known as:  COZAAR   metoprolol succinate 50 MG 24 hr tablet Commonly known as:  TOPROL-XL     TAKE these medications   acetaminophen 650 MG CR tablet Commonly  known as:  TYLENOL Take 650 mg by mouth 2 (two) times daily.   amiodarone 200 MG tablet Commonly known as:  PACERONE Take 2 tablets (400 mg total) by mouth 2 (two) times daily.   aspirin EC 81 MG tablet Take 81 mg by mouth daily.   CENTRUM ADULTS PO Take 1 tablet by mouth daily.   COLCRYS 0.6 MG tablet Generic drug:  colchicine TAKE 1 TABLET BY MOUTH ONCE DAILY What changed:  how much to take   desonide 0.05 % cream Commonly known as:  DESOWEN Apply topically 2 (two) times daily.   dexamethasone 4 MG tablet Commonly known as:  DECADRON Take 2 tablets (8 mg total) by mouth daily. Start the day after chemotherapy for 2 days.   empagliflozin 10 MG Tabs tablet Commonly known as:  JARDIANCE Take 10 mg by mouth daily.   fexofenadine 180 MG tablet Commonly known as:  ALLEGRA Take 180 mg by mouth daily.   finasteride 5 MG tablet Commonly known as:  PROSCAR TAKE 1 TABLET BY MOUTH ONCE DAILY   fluticasone 50 MCG/ACT nasal spray Commonly known as:  FLONASE Place 2 sprays daily into both nostrils.   ketoconazole 2 % shampoo Commonly known as:  NIZORAL Apply 1 application topically 2 (two) times a week.   lidocaine-prilocaine cream Commonly known as:  EMLA Apply to affected area once   loratadine 10 MG tablet Commonly known as:  CLARITIN Take 1 tablet (10 mg total) by mouth daily.   LORazepam 0.5 MG tablet Commonly known as:  ATIVAN Take 1 tablet (0.5 mg total) by mouth every 6 (six) hours as needed (Nausea or vomiting).   Magnesium 400 MG Tabs Take 400 mg by mouth daily.   omeprazole 20 MG capsule Commonly known as:  PRILOSEC Take 1 capsule (20 mg total) by mouth daily.   ondansetron 8 MG tablet Commonly known as:  ZOFRAN Take 1 tablet (8 mg total) by mouth 2 (two) times daily as needed for refractory nausea / vomiting. Start on day 3 after chemo.   potassium chloride SA 20 MEQ tablet Commonly known as:  K-DUR,KLOR-CON Take 1 tablet (20 mEq total) by mouth  daily.   prochlorperazine 10 MG tablet Commonly known as:  COMPAZINE Take 1 tablet (10 mg total) by mouth every 6 (six) hours as needed (Nausea or vomiting).   SYSTANE OP Place 1 drop into both eyes as needed (for dry eyes).   tamsulosin 0.4 MG Caps capsule Commonly known as:  FLOMAX Take 2 capsules (0.8 mg total) by mouth daily.   tiotropium 18 MCG inhalation capsule Commonly known as:  SPIRIVA Place 1 capsule (18 mcg total) into inhaler and inhale daily.   UNABLE TO FIND CBD Hemp topical cream   warfarin 4 MG tablet Commonly known as:  COUMADIN TAKE 1 TABLET BY MOUTH ONCE DAILY AT 6 P.M.   warfarin 3 MG tablet Commonly known as:  COUMADIN TAKE 1 TABLET BY MOUTH ONCE DAILY.   warfarin 1 MG tablet Commonly  known as:  COUMADIN TAKE 1 TABLET BY MOUTH ONCE DAILY       Today   VITAL SIGNS:  Blood pressure (!) 99/59, pulse 99, temperature 98 F (36.7 C), temperature source Oral, resp. rate 18, height 5\' 7"  (1.702 m), weight 73.6 kg, SpO2 100 %.  I/O:  No intake or output data in the 24 hours ending 10/30/18 1256  PHYSICAL EXAMINATION:  Physical Exam  GENERAL:  77 y.o.-year-old patient lying in the bed with no acute distress.  LUNGS: Normal breath sounds bilaterally, no wheezing, rales,rhonchi or crepitation. No use of accessory muscles of respiration.  CARDIOVASCULAR: S1, S2 normal. No murmurs, rubs, or gallops.  ABDOMEN: Soft, non-tender, non-distended. Bowel sounds present. No organomegaly or mass.  NEUROLOGIC: Moves all 4 extremities. PSYCHIATRIC: The patient is alert and oriented x 3.  SKIN: No obvious rash, lesion, or ulcer.   DATA REVIEW:   CBC Recent Labs  Lab 10/30/18 0415  WBC 7.9  HGB 8.4*  HCT 25.0*  PLT 78*    Chemistries  Recent Labs  Lab 10/28/18 0557 10/29/18 0733  NA 132*  --   K 3.9  --   CL 93*  --   CO2 29  --   GLUCOSE 103*  --   BUN 26*  --   CREATININE 0.85  --   CALCIUM 8.0*  --   MG  --  1.7  AST 54*  --   ALT 339*   --   ALKPHOS 92  --   BILITOT 2.9*  --     Cardiac Enzymes No results for input(s): TROPONINI in the last 168 hours.  Microbiology Results  Results for orders placed or performed in visit on 10/19/17  Microscopic Examination     Status: Abnormal   Collection Time: 10/24/17  9:09 AM  Result Value Ref Range Status   WBC, UA 0-5 0 - 5 /hpf Final   RBC, UA 0-2 0 - 2 /hpf Final   Epithelial Cells (non renal) 0-10 0 - 10 /hpf Final   Renal Epithel, UA 0-10 (A) None seen /hpf Final   Bacteria, UA None seen None seen/Few Final    RADIOLOGY:  No results found.  Follow up with PCP in 1 week.  Management plans discussed with the patient, family and they are in agreement.  CODE STATUS:  Code Status History    Date Active Date Inactive Code Status Order ID Comments User Context   10/21/2018 0526 10/24/2018 1229 DNR [id number]  Arta Silence, MD ED   10/12/2018 0544 10/15/2018 2036 Full Code [id number]  Harrie Foreman, MD Inpatient   06/13/2017 2328 06/15/2017 1556 Full Code [id number]  Lance Coon, MD Inpatient    Advance Directive Documentation     Most Recent Value  Type of Advance Directive  Living will  Pre-existing out of facility DNR order (yellow form or pink MOST form)  -  "MOST" Form in Place?  -      TOTAL TIME TAKING CARE OF THIS PATIENT ON DAY OF DISCHARGE: more than 30 minutes.   Leia Alf Macintyre Alexa M.D on 10/30/2018 at 12:56 PM  Between 7am to 6pm - Pager - [phone]  After 6pm go to [email or web address] - password [email or web address] [facility]  Office  [phone]  CC: Primary care physician; Guadalupe Maple, MD  Note: This dictation was prepared with Dragon dictation along with smaller phrase technology. Any transcriptional errors that result from this process are unintentional.

## 2018-10-30 NOTE — Plan of Care (Signed)

## 2018-10-30 NOTE — Progress Notes (Signed)
[facility] for Warfarin Indication: atrial fibrillation  Allergies  Allergen Reactions  . Prednisone Other (See Comments)    Reaction: "organs shutting down"    Patient Measurements: Height: 5\' 3"  (160 cm) Weight: 166 lb 3.6 oz (75.4 kg) IBW/kg (Calculated) : 56.9  Vital Signs: Temp: 97.6 F (36.4 C) (12/02 0551) Temp Source: Oral (12/02 0551) BP: 99/77 (12/02 0551) Pulse Rate: 102 (12/02 0551)  Labs: Recent Labs    10/28/18 0557 10/29/18 0733 10/30/18 0415  HGB 8.2* 8.3* 8.4*  HCT 24.5* 25.1* 25.0*  PLT 75* 80* 78*  LABPROT 22.2* 21.2* 23.3*  INR 1.97 1.86 2.10  CREATININE 0.85  --   --     Estimated Creatinine Clearance: 66.2 mL/min (by C-G formula based on SCr of 0.85 mg/dL).   Medical History: Past Medical History:  Diagnosis Date  . AICD (automatic cardioverter/defibrillator) present   . Aortic valve regurgitation   . Atrial fibrillation ([place])   . Barrett's esophagus   . Cancer ([place])    sate III lung  . CHF (congestive heart failure) ([place])   . COPD (chronic obstructive pulmonary disease) ([place])   . Diverticulosis   . Dysrhythmia   . GERD (gastroesophageal reflux disease)   . Gout   . Headache    aura only  . Heart murmur   . Hematuria   . Hernia of abdominal cavity   . History of prosthetic heart valve   . Hyperlipidemia   . Hypertension   . Nodular prostate with urinary obstruction   . Presence of permanent cardiac pacemaker   . Renal insufficiency   . Substance abuse ([facility])    alcohol    Medications:  Scheduled:  . diltiazem  30 mg Oral Q8H  . finasteride  5 mg Oral Daily  . guaiFENesin  600 mg Oral BID  . loratadine  10 mg Oral Daily  . metoprolol tartrate  50 mg Oral BID  . pantoprazole  40 mg Oral Daily  . Tbo-filgastrim (GRANIX) SQ  480 mcg Subcutaneous q1800  . tiotropium  18 mcg Inhalation Daily  . warfarin  3 mg Oral q1800  . Warfarin - Pharmacist Dosing Inpatient   Does not apply q1800    Infusions:   PRN: bisacodyl, guaiFENesin-dextromethorphan, senna-docusate  Assessment: 77 year old male with hx of Afib, presented with SOB and weakness. Transaminasemia holding amiodarone. INR elevated but trending down.Home regimen is warfarin 3 mg daily.   Date INR Warfarin Dose  11/23 7.26 None- vitamin K 5 mg x1   11/24 5.13 None  11/24 4.67 None  11/24 4.19 None  11/25 3.95 Hold  11/26 4.36 Hold   11/27 4.14 Hold  11/28 3.15 Hold  11/29 2.34 1mg   11/30 1.97 3 mg  12/1 1.86 3 mg     Goal of Therapy:  INR 2-3 Monitor platelets by anticoagulation protocol: Yes   Plan:  Continue home regimen warfarin 3 mg po daily.Platelets trending down - recommend to hold warfarin if platelets drop below 50K.   Monitor LFTs - trending down. Daily INR and CBC will be ordered.   Oswald Hillock, PharmD Clinical Pharmacist  10/30/2018,7:28 AM

## 2018-10-30 NOTE — Clinical Social Work Note (Signed)
Patient is medically ready for discharge today. CSW notified patient and wife in room of discharge. CSW notified Otila Klu[facility] at [facility] of discharge today. Patient will be transported by EMS. RN to call report and call for transport.   [place], [place]

## 2018-10-30 NOTE — Discharge Summary (Signed)
[facility] [place] NAME: Kirk Jones    MR#:  [id number]  DATE OF BIRTH:  11-Feb-1941  DATE OF ADMISSION:  10/20/2018 ADMITTING PHYSICIAN: Arta Silence, MD  DATE OF DISCHARGE: 10/30/2018  PRIMARY CARE PHYSICIAN: Guadalupe Maple, MD   ADMISSION DIAGNOSIS:  Shortness of breath [R06.02] Hyperkalemia [E87.5] Transaminasemia [R74.0] Generalized weakness [R53.1] Supratherapeutic INR [R79.1] Acute on chronic congestive heart failure, unspecified heart failure type ([place]) [I50.9]  DISCHARGE DIAGNOSIS:  Ischemic hepatitis Hypomagnesemia Right-sided heart failure with congestive hepatopathy History of aortic valve replacement Stage III lung cancer Hepatitis A infection Leukopenia Neutropenia Supratherapeutic INR Chronic atrial fibrillation Acute on chronic kidney disease stage III  SECONDARY DIAGNOSIS:   Past Medical History:  Diagnosis Date  . AICD (automatic cardioverter/defibrillator) present   . Aortic valve regurgitation   . Atrial fibrillation ([place])   . Barrett's esophagus   . Cancer ([place])    sate III lung  . CHF (congestive heart failure) ([place])   . COPD (chronic obstructive pulmonary disease) ([place])   . Diverticulosis   . Dysrhythmia   . GERD (gastroesophageal reflux disease)   . Gout   . Headache    aura only  . Heart murmur   . Hematuria   . Hernia of abdominal cavity   . History of prosthetic heart valve   . Hyperlipidemia   . Hypertension   . Nodular prostate with urinary obstruction   . Presence of permanent cardiac pacemaker   . Renal insufficiency   . Substance abuse ([place])    alcohol     ADMITTING HISTORY Kirk Jones  is a 77 y.o. male p/w SOB and generalized weakness. He has a great many medical issues, including but not limited to ejection fraction of 50% with moderate MR, Hx [employer] AVR, T2NIDDM, HTN, HLD, CAD/MI (stent), Afib (Coumadin), VTach (s/p Med[employer] AICD/PPM, recently started on  Amiodarone), stage III RLL lung SCC, COPD (2L [place] continuous home O2), BPH, GERD, gout. This is by no means an exhaustive list. He was started on Amiodarone ~1wk ago. He states that on Thursday (10/19/2018) evening, his O2 hose came detached from the generator. He got up to reattach the tube. He stood up and felt shaky, lightheaded and generally weak. He tried to brace himself by holding the recliner, but it reclined and he fell backwards and to his left side. He states he sustained some skin tears/injuries, which EMS cleaned and bandaged, but he refused transport to the ED. He has had SOB and continued weakness since then. Persistence of symptoms prompted hospitalization. (-) F/C/N/V/D/AP, urinary symptoms.Admission labwork demonstrates hypovolemic hypochloremic hyponatremia, acute renal failure w/ hyperkalemia, transaminasemia (hepatocellular injury with impaired liver synthetic function, liver failure, hyperammonemia w/o encephalopathy), lactate elevation, supratherapeutic INR (w/o active bleeding). Bradycardic (HR 40s) at the time of my assessment. Though his labwork suggests that his internals are in turmoil, he appears remarkably well. He is comfortable, and is not in acute distress. He does not appear septic/toxic. He is AAOx3, is not encephalopathic, and is able to answer my questions w/o difficulty. He tells me he used to drink 2-3 alcoholic beverages per day, but has not consumed alcohol in ~6wks. No mention of liver abnormality on 09/25/2018 CT abdomen. AST 3956, ALT 2317 (acutely increased from AST 31 and ALT 17 as of 10/19/2018). Hepatitis panel pending. Liver doppler (-) thrombosis.   HOSPITAL COURSE:  Patient was admitted initially to ICU for elevated liver enzymes AST was 3000 956 and  ALT was 2317.  Patient also had elevated INR.  Coumadin was held.  Gastroenterology consultation along with cardiology consultation was done during the hospitalization.  Oncology consultation was done for lung  cancer.  Patient was initially hydrated with IV fluids for ischemic hepatitis.  Liver enzymes started trending down.  Hepatotoxic drugs were avoided.  INR trended down as Coumadin was held.  Patient was also seen and evaluated by nephrology during hospitalization.  He has chronic kidney disease stage III.  Lasix was held initially because of acute on chronic renal failure.  Patient received IV bicarbonate drip in the ICU.  Patient's atrial fibrillation was controlled with beta-blocker.  Cardizem was also added for better control.  Patient was worked up with chest x-rays and CT chest during the hospitalization.  He also had an echocardiogram which showed severely reduced systolic function with EF of 20 to 25% and diffuse hypokinesis.  No ischemic work-up recommended by cardiology or any acute intervention.  Oncology also deferred further chemotherapy considering the poor cardiopulmonary status of the patient.  Coumadin has been resumed after INR came down to below 3.  Patient recommended good hand hygiene and oral hygiene for hepatitis C infection.  Good nutrition and supportive care.  Electrolytes were aggressively replaced.  Patient will continue medical management for right-sided heart failure with congestive hepatopathy.  He received physical therapy during hospitalization.  Patient also received Granix injection subcutaneously for neutropenia and leukopenia.  Neutropenia and leukopenia have resolved at the time of discharge.  No evidence of any infection and fever.  Patient unable to take care of himself and family unable to take care of him at home.  They have opted for skilled nursing facility.  Social worker was consulted [facility]  CONSULTS OBTAINED:  Treatment Team:  Arta Silence, MD Sindy Guadeloupe, MD Corey Skains, MD Teodoro Spray, MD  DRUG ALLERGIES:   Allergies  Allergen Reactions  . Prednisone Other (See Comments)    Reaction: "organs shutting  down"    DISCHARGE MEDICATIONS:   Allergies as of 10/30/2018      Reactions   Prednisone Other (See Comments)   Reaction: "organs shutting down"      Medication List    STOP taking these medications   acetaminophen 650 MG CR tablet Commonly known as:  TYLENOL   amiodarone 200 MG tablet Commonly known as:  PACERONE   COLCRYS 0.6 MG tablet Generic drug:  colchicine   dexamethasone 4 MG tablet Commonly known as:  DECADRON   empagliflozin 10 MG Tabs tablet Commonly known as:  JARDIANCE   fexofenadine 180 MG tablet Commonly known as:  ALLEGRA   ketoconazole 2 % shampoo Commonly known as:  NIZORAL   lidocaine-prilocaine cream Commonly known as:  EMLA   loratadine 10 MG tablet Commonly known as:  CLARITIN   LORazepam 0.5 MG tablet Commonly known as:  ATIVAN   ondansetron 8 MG tablet Commonly known as:  ZOFRAN   potassium chloride SA 20 MEQ tablet Commonly known as:  K-DUR,KLOR-CON   prochlorperazine 10 MG tablet Commonly known as:  COMPAZINE   tamsulosin 0.4 MG Caps capsule Commonly known as:  FLOMAX   UNABLE TO FIND     TAKE these medications   aspirin EC 81 MG tablet Take 81 mg by mouth daily.   CENTRUM ADULTS PO Take 1 tablet by mouth daily.   desonide 0.05 % cream Commonly known as:  DESOWEN Apply topically 2 (two)  times daily.   diltiazem 120 MG 24 hr capsule Commonly known as:  CARDIZEM CD Take 1 capsule (120 mg total) by mouth daily. What changed:    medication strength  how much to take   finasteride 5 MG tablet Commonly known as:  PROSCAR TAKE 1 TABLET BY MOUTH ONCE DAILY   fluticasone 50 MCG/ACT nasal spray Commonly known as:  FLONASE Place 2 sprays daily into both nostrils.   furosemide 40 MG tablet Commonly known as:  LASIX Take 0.5 tablets (20 mg total) by mouth daily. What changed:  how much to take   Magnesium 400 MG Tabs Take 400 mg by mouth daily.   metoprolol tartrate 50 MG tablet Commonly known as:   LOPRESSOR Take 1 tablet (50 mg total) by mouth 2 (two) times daily.   omeprazole 20 MG capsule Commonly known as:  PRILOSEC Take 1 capsule (20 mg total) by mouth daily.   senna-docusate 8.6-50 MG tablet Commonly known as:  Senokot-S Take 1 tablet by mouth at bedtime as needed for mild constipation.   SYSTANE OP Place 1 drop into both eyes as needed (for dry eyes).   tiotropium 18 MCG inhalation capsule Commonly known as:  SPIRIVA Place 1 capsule (18 mcg total) into inhaler and inhale daily.   warfarin 3 MG tablet Commonly known as:  COUMADIN TAKE 1 TABLET BY MOUTH ONCE DAILY. What changed:  Another medication with the same name was removed. Continue taking this medication, and follow the directions you see here.       Today  Patient seen and evaluated today No fever No shortness of breath Tolerating diet well [facility] with [facility] at 1 L via nasal cannula  VITAL SIGNS:  Blood pressure 98/70, pulse 68, temperature (!) 97.4 F (36.3 C), temperature source Oral, resp. rate 20, height 5\' 3"  (1.6 m), weight 75.4 kg, SpO2 98 %.  I/O:    Intake/Output Summary (Last 24 hours) at 10/30/2018 1033 Last data filed at 10/29/2018 1900 Gross per 24 hour  Intake 0 ml  Output -  Net 0 ml    PHYSICAL EXAMINATION:  Physical Exam  GENERAL:  77 y.o.-year-old patient lying in the bed with no acute distress.  LUNGS: Normal breath sounds bilaterally, no wheezing, rales,rhonchi or crepitation. No use of accessory muscles of respiration.  CARDIOVASCULAR: S1, S2 normal. No murmurs, rubs, or gallops.  ABDOMEN: Soft, non-tender, non-distended. Bowel sounds present. No organomegaly or mass.  NEUROLOGIC: Moves all 4 extremities. PSYCHIATRIC: The patient is alert and oriented x 3.  SKIN: No obvious rash, lesion, or ulcer.   DATA REVIEW:   CBC Recent Labs  Lab 10/30/18 0415  WBC 7.9  HGB 8.4*  HCT 25.0*  PLT 78*    Chemistries  Recent Labs  Lab  10/28/18 0557 10/29/18 0733  NA 132*  --   K 3.9  --   CL 93*  --   CO2 29  --   GLUCOSE 103*  --   BUN 26*  --   CREATININE 0.85  --   CALCIUM 8.0*  --   MG  --  1.7  AST 54*  --   ALT 339*  --   ALKPHOS 92  --   BILITOT 2.9*  --     Cardiac Enzymes No results for input(s): TROPONINI in the last 168 hours.  Microbiology Results  Results for orders placed or performed during the hospital encounter of 10/20/18  MRSA PCR Screening  Status: None   Collection Time: 10/21/18 11:27 AM  Result Value Ref Range Status   MRSA by PCR NEGATIVE NEGATIVE Final    Comment:        The GeneXpert MRSA Assay (FDA approved for NASAL specimens only), is one component of a comprehensive MRSA colonization surveillance program. It is not intended to diagnose MRSA infection nor to guide or monitor treatment for MRSA infections. Performed at [facility], [address], [place], [place] [address]     RADIOLOGY:  No results found.  Follow up with PCP in 1 week.  Management plans discussed with the patient, family and they are in agreement.  CODE STATUS: Full code    Code Status Orders  (From admission, onward)         Start     Ordered   10/24/18 1230  Full code  Continuous     10/24/18 1229        Code Status History    Date Active Date Inactive Code Status Order ID Comments User Context   10/21/2018 0526 10/24/2018 1229 DNR [id number]  Arta Silence, MD ED   10/12/2018 0544 10/15/2018 2036 Full Code [id number]  Harrie Foreman, MD Inpatient   06/13/2017 2328 06/15/2017 1556 Full Code [id number]  Lance Coon, MD Inpatient    Advance Directive Documentation     Most Recent Value  Type of Advance Directive  Healthcare Power of [place], Living will  Pre-existing out of facility DNR order (yellow form or pink MOST form)  -  "MOST" Form in Place?  -      TOTAL TIME TAKING CARE OF THIS PATIENT ON DAY OF DISCHARGE: more than 45 minutes.   Saundra Shelling M.D on 10/30/2018 at 10:33 AM  Between 7am to 6pm - Pager - [phone]  After 6pm go to [email or web address] - password [email or web address] [facility]  Office  [phone]  CC: Primary care physician; Guadalupe Maple, MD  Note: This dictation was prepared with Dragon dictation along with smaller phrase technology. Any transcriptional errors that result from this process are unintentional.

## 2018-10-30 NOTE — Progress Notes (Signed)
Physical Therapy Treatment Patient Details Name: Kirk Jones MRN: [id number] DOB: 08-09-1941 Today's Date: 10/30/2018    History of Present Illness Pt is a 77 y.o. male presenting to hospital 10/20/18 with SOB and weakness; recent fall with L hand skin tear.  Pt admitted with acute ischemic hepatitis, supratherapeutic INR, acute hyperkalemia, R sided heart failure with congestive hepatopathy, and a-fib.  PMH includes stage 3 lung CA on 2-3 L home O2, CHF, COPD, a-fib on warfarin, Barrett's esophagus, AICD, aortic aneurysm, expressive aphasia, CVA, AVR, CTR, h/o R femur fx surgery.    PT Comments    Pt was seen for mobility training and noted his difficulty in moving toward the side of bed, then to stand.  Pt was able to walk on previous visit but now is quite weak and unable.  Talked with wife about this and may be related to his LUE in a bandage with pt being anxious about it.  Left in bed with lunch in front of him, and wife to supervise as he is leaving today to go to SNF.  Follow up tomorrow if dc is held for some reason.  Follow Up Recommendations  SNF     Equipment Recommendations  None recommended by PT    Recommendations for Other Services       Precautions / Restrictions Precautions Precautions: Fall Precaution Comments: R chest port Restrictions Weight Bearing Restrictions: No    Mobility  Bed Mobility Overal bed mobility: Needs Assistance Bed Mobility: Supine to Sit;Sit to Supine     Supine to sit: Min assist Sit to supine: Min assist   General bed mobility comments: required help to sit up with trunk support and then did some scooting to help get back in bed  Transfers Overall transfer level: Needs assistance Equipment used: Rolling walker (2 wheeled) Transfers: Sit to/from Stand Sit to Stand: Mod assist         General transfer comment: mod with power up and cues for hand placemetn  Ambulation/Gait             General Gait Details: unable  to attempt today   Stairs             Wheelchair Mobility    Modified Rankin (Stroke Patients Only)       Balance     Sitting balance-Leahy Scale: Good       Standing balance-Leahy Scale: Poor                              Cognition Arousal/Alertness: Awake/alert Behavior During Therapy: WFL for tasks assessed/performed Overall Cognitive Status: Within Functional Limits for tasks assessed                                 General Comments: wife arrived at end of session      Exercises      General Comments        Pertinent Vitals/Pain Pain Assessment: No/denies pain    Home Living                      Prior Function            PT Goals (current goals can now be found in the care plan section) Progress towards PT goals: Not progressing toward goals - comment    Frequency    Min 2X/week  PT Plan Current plan remains appropriate    Co-evaluation              AM-PAC PT "6 Clicks" Mobility   Outcome Measure  Help needed turning from your back to your side while in a flat bed without using bedrails?: A Little Help needed moving from lying on your back to sitting on the side of a flat bed without using bedrails?: A Little Help needed moving to and from a bed to a chair (including a wheelchair)?: A Little Help needed standing up from a chair using your arms (e.g., wheelchair or bedside chair)?: A Lot Help needed to walk in hospital room?: A Lot Help needed climbing 3-5 steps with a railing? : Total 6 Click Score: 14    End of Session Equipment Utilized During Treatment: Gait belt;Oxygen Activity Tolerance: Patient tolerated treatment well Patient left: with call bell/phone within reach;with chair alarm set;with family/visitor present Nurse Communication: Mobility status PT Visit Diagnosis: Other abnormalities of gait and mobility (R26.89);Muscle weakness (generalized) (M62.81);History of falling  (Z91.81);Difficulty in walking, not elsewhere classified (R26.2)     Time: 5041-3643 PT Time Calculation (min) (ACUTE ONLY): 24 min  Charges:  $Therapeutic Activity: 23-37 mins                    Ramond Dial 10/30/2018, 1:16 PM   Mee Hives, PT MS Acute Rehab Dept. Number: [place] [facility] [place]

## 2018-10-30 NOTE — Care Management Important Message (Signed)
Important Message  Patient Details  Name: Kirk Jones MRN: [id number] Date of Birth: [birth year]   Medicare Important Message Given:  Yes    Juliann Pulse A Lindberg [place] 10/30/2018, 10:36 AM

## 2018-10-31 ENCOUNTER — Ambulatory Visit: Payer: Medicare Other

## 2018-10-31 ENCOUNTER — Other Ambulatory Visit: Payer: Self-pay | Admitting: *Deleted

## 2018-10-31 ENCOUNTER — Other Ambulatory Visit: Payer: Self-pay

## 2018-10-31 DIAGNOSIS — C3491 Malignant neoplasm of unspecified part of right bronchus or lung: Secondary | ICD-10-CM

## 2018-10-31 NOTE — Patient Outreach (Signed)
[facility]) Care Management [place] Telephone Outreach Care Coordination  10/31/2018  [place] [facility] 1941/11/16 [id number]  Care coordination Outreach to Kirk Jones, [facility] RN CM PAC re:  Kirk Jones, 77 y/o male referred to [facility] by inpatient RN CM after recent hospitalization November 14-17, 2019 for syncope/ mechanical fall with head injury- laceration requiring 16 sutures. Patient was discharged home from hospital to self-care with home health services in place through [name]. Patient has history including, but not limited to, A-Fib; HTN/ HLD; COPD, on home O2 as baseline at 2 L/min via [place]; CKD- stage III; CAD with previous aortic valve replacement, pacemaker, and ICD; (s) CHF; previous CVA; and lung cancer.  Unfortunately, patient experienced hospital readmission November 22- October 30, 2018 for shortness of breath, mechanical fall, weakness, CHF exacerbation and hyperkalemia.  Patient was discharged from hospital to SNF ([facility]) for rehabilitation.  Discussed original referral to [name] with [name] and updated her on patient's current status; we discussed [name] case closure for now with follow up from [name] while patient is in SNF to determine patient needs.  Plan:  [facility] RN CM PAC to follow up with patient/ family while patient in SNF  Oneta Rack, RN, BSN, [employer] [facility] Care Management  [phone]

## 2018-10-31 NOTE — Patient Outreach (Signed)
[facility]) Care Management  10/31/2018  K[place]on [facility]ler [facility] Jun 30, 1941 [id number]   Attended interdisciplinary discharge team meeting at [facility].  PT stated patient recently admitted and evaluation is pending.  Social work Engineer, site meeting scheduled for today.  Advancing to home is active with patient.  No discharge plan set yet.   Made aware by [employer] [facility] Manager patient was at [facility] and could potentially have CM needs.   Family at bedside for care-plan meeting with facility staff. Will plan to see patient next week at facility visit.   Rutherford Limerick RN, BSN [place] Acute Care Coordinator [phone]) Business Mobile [phone]) Toll free office

## 2018-11-01 ENCOUNTER — Ambulatory Visit: Payer: Medicare Other

## 2018-11-01 DIAGNOSIS — J449 Chronic obstructive pulmonary disease, unspecified: Secondary | ICD-10-CM | POA: Diagnosis not present

## 2018-11-01 DIAGNOSIS — I509 Heart failure, unspecified: Secondary | ICD-10-CM | POA: Diagnosis not present

## 2018-11-01 DIAGNOSIS — N183 Chronic kidney disease, stage 3 (moderate): Secondary | ICD-10-CM | POA: Diagnosis not present

## 2018-11-01 DIAGNOSIS — Z952 Presence of prosthetic heart valve: Secondary | ICD-10-CM | POA: Diagnosis not present

## 2018-11-01 DIAGNOSIS — I4891 Unspecified atrial fibrillation: Secondary | ICD-10-CM | POA: Diagnosis not present

## 2018-11-01 DIAGNOSIS — K759 Inflammatory liver disease, unspecified: Secondary | ICD-10-CM | POA: Diagnosis not present

## 2018-11-01 DIAGNOSIS — C3491 Malignant neoplasm of unspecified part of right bronchus or lung: Secondary | ICD-10-CM | POA: Diagnosis not present

## 2018-11-01 DIAGNOSIS — K219 Gastro-esophageal reflux disease without esophagitis: Secondary | ICD-10-CM | POA: Diagnosis not present

## 2018-11-02 ENCOUNTER — Ambulatory Visit: Payer: Medicare Other

## 2018-11-02 ENCOUNTER — Inpatient Hospital Stay: Payer: Medicare Other

## 2018-11-02 ENCOUNTER — Inpatient Hospital Stay: Payer: Medicare Other | Admitting: Oncology

## 2018-11-02 ENCOUNTER — Inpatient Hospital Stay: Payer: Medicare Other | Admitting: Hospice and Palliative Medicine

## 2018-11-02 LAB — BLOOD GAS, VENOUS
Acid-Base Excess: 6.6 mmol/L — ABNORMAL HIGH (ref 0.0–2.0)
BICARBONATE: 33.7 mmol/L — AB (ref 20.0–28.0)
O2 Saturation: 59.9 %
PCO2 VEN: 61 mmHg — AB (ref 44.0–60.0)
Patient temperature: 37
pH, Ven: 7.35 (ref 7.250–7.430)
pO2, Ven: 33 mmHg (ref 32.0–45.0)

## 2018-11-03 ENCOUNTER — Ambulatory Visit: Payer: Medicare Other

## 2018-11-06 ENCOUNTER — Ambulatory Visit: Payer: Medicare Other

## 2018-11-06 NOTE — Progress Notes (Deleted)
   Patient ID: Zaim N[facility], male    DOB: 1941/03/26, 77 y.o.   MRN: [id number]  HPI  Mr Walraven is a 77 y/o male with a history of  Echo report from 10/22/18 showed an EF of 20-25% along with moderate MR, severe TR and an elevated PA pressure of 76 mm Hg.   Admitted 10/20/18 due to generalized weakness along with acute on chronic HF. Cardiology, GI, oncology and palliative care consults were obtained. Initially given IV fluids for ischemia hepatitis with improvement in LFT's. Coumadin held due to elevated INR. Medications adjusted and he was discharged to SNF after 10 days. Admitted 10/11/18 due to syncope due to SVT. Cardiology, oncology and palliative care consults were obtained. Initially needed cardizem and amiodarone drips and then transitioned to oral medications. Discharged after 4 days.   He presents today for his initial visit with a chief complaint of    Review of Systems    Physical Exam    Assessment & Plan:   1: Chronic heart failure with reduced ejection fraction- - NYHA class - BNP 10/21/18 was 4264.0  2: HTN- - BP - BMP from 10/28/18 reviewed and showed sodium 132, potassium 3.9, creatinine 0.85 and GFR >60  3: Stage III lung cancer-

## 2018-11-06 NOTE — Telephone Encounter (Signed)
Copied from [place] [id number]. Topic: General - Inquiry >> Oct 31, 2018 12:00 PM Vernona Rieger wrote: Reason for CRM: Patient's daughter, Gerald Stabs called and said she wanted to have a consult with Dr Jeananne Rama as a family regarding her dad. She wants to know the next steps for her dad. She said that he is not actually well enough to come to the appointment. [facility] and wanted to know what Dr Jeananne Rama suggests? She said she wanted to come as a family this Friday without her dad. She also wanted to know if that is not an option, does Dr Jeananne Rama meet outside of the office? She would like a call back @ [phone]. >> Oct 31, 2018  1:56 PM Stark Klein wrote: Damaris Schooner with Gerald Stabs and let her know that Dr Jeananne Rama is out of the office and she would like a call back on Monday when he returns.  >> Nov 06, 2018 10:46 AM Vernona Rieger wrote: Patient's daughter, Margreta Journey called and wanted to come in this week.. She said that she can get her dad to come into the office now just not early in the morning because he moves slow.

## 2018-11-07 ENCOUNTER — Other Ambulatory Visit: Payer: Self-pay | Admitting: Hospice and Palliative Medicine

## 2018-11-07 ENCOUNTER — Ambulatory Visit (INDEPENDENT_AMBULATORY_CARE_PROVIDER_SITE_OTHER): Payer: Medicare Other | Admitting: Family Medicine

## 2018-11-07 ENCOUNTER — Ambulatory Visit: Payer: Medicare Other

## 2018-11-07 ENCOUNTER — Encounter: Payer: Self-pay | Admitting: Family Medicine

## 2018-11-07 ENCOUNTER — Other Ambulatory Visit: Payer: Self-pay | Admitting: *Deleted

## 2018-11-07 DIAGNOSIS — I509 Heart failure, unspecified: Secondary | ICD-10-CM

## 2018-11-07 DIAGNOSIS — R531 Weakness: Secondary | ICD-10-CM | POA: Diagnosis not present

## 2018-11-07 DIAGNOSIS — C3491 Malignant neoplasm of unspecified part of right bronchus or lung: Secondary | ICD-10-CM | POA: Diagnosis not present

## 2018-11-07 NOTE — Assessment & Plan Note (Signed)
Discussed radiation therapy and patient not interested in further therapy wants to have palliative care at home

## 2018-11-07 NOTE — Assessment & Plan Note (Signed)
This is the main limiting factor for patient's lack of ability to sustain mediation therapy for his lung cancer

## 2018-11-07 NOTE — Progress Notes (Signed)
BP 101/70   Pulse 95   Temp 98.9 F (37.2 C) (Oral)   SpO2 (!) 85%    Subjective:    [name], male    DOB: 1941-02-03, 77 y.o.   MRN: [id number]  HPI: Kirk Jones is a 77 y.o. male  Chief Complaint  Patient presents with  . Follow-up    pt's daughter is requesting to have her liver and kidney labs done on her father    Patient follow-up for multiple medical problems accompanied by his wife and 2 daughters. Patient currently resides at [facility] for rehab and is scheduled for radiation therapy for lung cancer starting tomorrow. I discussed with patient's cardiologist at [facility] clinic regarding his heart failure and ability to withstand radiation cardiologist feels unequivocally that the patient is unable to undergo radiation therapy. Discussed with the patient his goals which are to go home be able to clean himself dress be a burden to his family.  Patient not interested in curative or radiation therapy which patient feels that he is tired enough. Reviewed with patient's daughters who want to facilitate their dad's care. I did extensive chart review.  And patient has had a very stormy course with multiple system failure.   Relevant past medical, surgical, family and social history reviewed and updated as indicated. Interim medical history since our last visit reviewed. Allergies and medications reviewed and updated.  Review of Systems  Constitutional: Positive for fatigue.  HENT: Negative.   Respiratory: Positive for choking, chest tightness and shortness of breath.   Cardiovascular: Positive for leg swelling. Negative for chest pain.    Per HPI unless specifically indicated above     Objective:    BP 101/70   Pulse 95   Temp 98.9 F (37.2 C) (Oral)   SpO2 (!) 85%   Wt Readings from Last 3 Encounters:  10/30/18 166 lb 3.6 oz (75.4 kg)  10/19/18 160 lb 12.8 oz (72.9 kg)  10/15/18 162 lb 3.2 oz (73.6 kg)    Physical Exam    Constitutional:  Wheelchair  HENT:  Head: Normocephalic.  Neck: Normal range of motion.  Cardiovascular:  Murmur heard. Pulmonary/Chest: Effort normal. He has rales.  Musculoskeletal: He exhibits edema.  Marked edema of both legs and left arm  Skin:  Sutures removed from right temple wound where stitches were put in approximately 3 weeks ago wound is healed well with no signs of secondary infection        Assessment & Plan:   Problem List Items Addressed This Visit      Cardiovascular and Mediastinum   Acute on chronic congestive heart failure ([place])    This is the main limiting factor for patient's lack of ability to sustain mediation therapy for his lung cancer        Respiratory   Squamous cell carcinoma of right lung [facility])    Discussed radiation therapy and patient not interested in further therapy wants to have palliative care at home        Other   Generalized weakness    Result of multisystem failure aging lung cancer heart failure. Discussed extensively as noted above and briefly with family. Family has elected to not do radiation therapy at this time to do supportive care. Will explore blood work is being drawn at nursing home observe. Discussed palliative care. These discussions took 45+ minutes          Follow up plan: Return if symptoms worsen or  fail to improve.

## 2018-11-07 NOTE — Progress Notes (Signed)
I received a call from patient's daughter.  She says the patient has been progressively declining despite being at rehab. Patient is now no longer able to stand or transfer without maximum assistance.  His oral intake is reportedly poor.  Patient is going some days without eating anything at all.  Daughter says that patient and family have decided to forego future work-up and treatment of the cancer. His daughter seems to recognize that patient is likely nearing end-of-life.  We talked about hospice care as an option either at home or a residential hospice facility.  I had previously sent referrals for patient to be followed at the facility by palliative care.  Unfortunately, the staff at the facility apparently did not agree with the consult.  Patient's daughter plans to ask the facility staff tomorrow for palliative care consult.  I will also resend the referral.  I think patient would greatly benefit from palliative care following him at the nursing facility to help guide the family in decision-making and likely help facilitate a transition to hospice care.

## 2018-11-07 NOTE — Patient Outreach (Signed)
[facility]) Care Management  11/07/2018  Kirk Jones [facility] Feb 07, 1941 [id number]   Attended interdisciplinary team meeting at [facility] with [facility] UM team member Marlowe Kays to discuss patient's progress and plan for transitioning to home. Noted in the emr patient and family making decisions around palliative or hospice care.  PT making plans to do family care training this week.   Patient is not appropriate for [facility] care Management at this time.   Will continue to monitor.   Rutherford Limerick RN, BSN [place] Acute Care Coordinator [phone]) Business Mobile [phone]) Toll free office

## 2018-11-07 NOTE — Assessment & Plan Note (Signed)
Result of multisystem failure aging lung cancer heart failure. Discussed extensively as noted above and briefly with family. Family has elected to not do radiation therapy at this time to do supportive care. Will explore blood work is being drawn at nursing home observe. Discussed palliative care. These discussions took 45+ minutes

## 2018-11-08 ENCOUNTER — Telehealth: Payer: Self-pay | Admitting: Family

## 2018-11-08 ENCOUNTER — Ambulatory Visit: Payer: Medicare Other

## 2018-11-08 ENCOUNTER — Ambulatory Visit: Payer: Medicare Other | Admitting: Family

## 2018-11-08 NOTE — Telephone Encounter (Signed)
Patient did not show for his Heart Failure Clinic appointment on 11/08/18. Will attempt to reschedule.

## 2018-11-09 ENCOUNTER — Ambulatory Visit: Payer: Medicare Other

## 2018-11-09 ENCOUNTER — Ambulatory Visit: Payer: Medicare Other | Admitting: Radiation Oncology

## 2018-11-09 ENCOUNTER — Ambulatory Visit: Payer: Medicare Other | Admitting: Family Medicine

## 2018-11-09 ENCOUNTER — Inpatient Hospital Stay: Payer: Medicare Other

## 2018-11-09 ENCOUNTER — Inpatient Hospital Stay: Payer: Medicare Other | Admitting: Oncology

## 2018-11-09 ENCOUNTER — Telehealth: Payer: Self-pay | Admitting: Gastroenterology

## 2018-11-09 DIAGNOSIS — N183 Chronic kidney disease, stage 3 (moderate): Secondary | ICD-10-CM | POA: Diagnosis not present

## 2018-11-09 DIAGNOSIS — I509 Heart failure, unspecified: Secondary | ICD-10-CM | POA: Diagnosis not present

## 2018-11-09 DIAGNOSIS — K759 Inflammatory liver disease, unspecified: Secondary | ICD-10-CM | POA: Diagnosis not present

## 2018-11-09 DIAGNOSIS — K219 Gastro-esophageal reflux disease without esophagitis: Secondary | ICD-10-CM | POA: Diagnosis not present

## 2018-11-09 DIAGNOSIS — Z952 Presence of prosthetic heart valve: Secondary | ICD-10-CM | POA: Diagnosis not present

## 2018-11-09 DIAGNOSIS — C3491 Malignant neoplasm of unspecified part of right bronchus or lung: Secondary | ICD-10-CM | POA: Diagnosis not present

## 2018-11-09 DIAGNOSIS — J449 Chronic obstructive pulmonary disease, unspecified: Secondary | ICD-10-CM | POA: Diagnosis not present

## 2018-11-09 DIAGNOSIS — I4891 Unspecified atrial fibrillation: Secondary | ICD-10-CM | POA: Diagnosis not present

## 2018-11-09 NOTE — Telephone Encounter (Signed)
Left vm for pt to call office and schedule f/u  With Dr. Darene Lamer 4-6 weeks

## 2018-11-09 NOTE — Telephone Encounter (Signed)
-----   Message from Virgel Manifold, MD sent at 11/08/2018  2:39 PM EST -----  Please set up clinic appointment with me or Dr. Vicente Males in 4-8 weeks

## 2018-11-10 ENCOUNTER — Non-Acute Institutional Stay: Payer: Medicare Other | Admitting: Nurse Practitioner

## 2018-11-10 ENCOUNTER — Ambulatory Visit: Payer: Medicare Other

## 2018-11-10 ENCOUNTER — Encounter: Payer: Self-pay | Admitting: Nurse Practitioner

## 2018-11-10 VITALS — HR 88 | Resp 22

## 2018-11-10 DIAGNOSIS — Z515 Encounter for palliative care: Secondary | ICD-10-CM

## 2018-11-10 DIAGNOSIS — R630 Anorexia: Secondary | ICD-10-CM | POA: Diagnosis not present

## 2018-11-10 DIAGNOSIS — R0609 Other forms of dyspnea: Secondary | ICD-10-CM

## 2018-11-10 DIAGNOSIS — R531 Weakness: Secondary | ICD-10-CM | POA: Diagnosis not present

## 2018-11-10 NOTE — Progress Notes (Signed)
[facility] Telephone: [phone] Fax: [phone]  PATIENT NAME: [facility] DOB: 09-23-1941 MRN: [id number]  PRIMARY CARE PROVIDER:   Guadalupe Maple, MD  REFERRING PROVIDER:  Dr Lovie Macadamia; [facility] RESPONSIBLE PARTY:  daughter Blinda Leatherwood at [phone] or daughter Sherilyn Dacosta at [phone]   ASSESSMENT:   I visited and observed Kirk Jones. We talked about purpose for palliative medicine visit. We talked about past medical history in the setting of chronic and metastatic disease progression. We talked about symptoms of shortness of breath and he has been on oxygen for the last month. We talked about recent hospitalization. He denied symptoms of pain. We talked about swelling in his left arm and legs. We talked about his appetite being fair to poor. He talked about not being very hungry and food not as appealing. Deanna his daughter endorses he ate banana pudding last night and did well with it. We talked about medical goals of care to include aggressive versus conservative versus Comfort Care. Kirk Jones verbalize that he no longer would like to have chemotherapy and wishes more for conservative comfort care path. We talked about code status and he verbalized he does not want to be resuscitated he has a living will. We talked about his progress with short-term rehab. His wife expressed concern that she feels like they're telling him he's doing better than he actually is. She shared that he is walking with assistance and a walker transferring to the bathroom in his room to the chair. When asked about walking wife and daughter says it takes a lot of effort, energy and help. Wife and daughter says she feels like he needs more therapy. Ask if they have a discharge day as of one therapy will be completed and they verbalize maybe ask if they have a discharge day as of one therapy will be completed and they verbalize maybe around eight more days. We talked about  discharge and he wishes to be discharged home. We talked about options of Home Health with therapy with in-home palliative medicine versus hospice screening. We talked about Services of hospice as well as home health and palliative care. Kirk Jones, wife and daughter Tilda Burrow all endorse their wishes are for hospice screening upon discharge. Discussed best plan of care would be to stay at [facility] for as long as he is able to do rehab and then discharged home with hospice screening. Questions answered dissatisfaction. Kirk Jones daughter Gerald Stabs arrived and Revisited discussion. We talked about private duty sitters and encouraged to start with more hours in figure out what is actually needed once he gets home. Encouraged to call early since Christmas holiday is coming and best plan of care to get that arranged for private duty setters arranged with tentative discharge day home as soon as possible. Gerald Stabs daughter asked about how to get lab results once they're drawn. Refer back to Dr. Lovie Macadamia, facility for results. Talk to that role of palliative medicine during short-term rehab. Discuss that will follow them one week, next Friday at 3 p.m. if needed or sooner should he declined. Questions answered to satisfaction. Therapeutic listening and emotional support provided. Contact information provided. I have dated staff in ask him unit manager to speak with Elby Showers and Ms. Redlich as they have further questions concerning speaking with Dr Lovie Macadamia.  RECOMMENDATIONS and PLAN:   1. Palliative care encounter Z51.5; Palliative medicine team will continue to support patient, patient's family, and medical team. Visit consisted of counseling  and education dealing with the complex and emotionally intense issues of symptom management and palliative care in the setting of serious and potentially life-threatening illness  2. Anorexia R63.0 secondary to protein calorie malnutrition, lung cancer, CHF, debility.  Continue to monitor daily weights, supplements, supportive measures and courage to eat  3. Dyspneic R06.00 secondary to lung cancer, congestive heart failure. Continue symptom management, O2, inhalation therapy, diuretics, may consider roxanol if uncontrolled  4. Generalized weakness R53.1  secondary to lung cancer, chf, debility continue with therapy as able. Encourage energy conservation and rest times.  I spent 105 minutes providing this consultation,  from 2:45 pm to 4:30pm. More than 50% of the time in this consultation was spent coordinating communication.   HISTORY OF PRESENT ILLNESS:  Kirk Jones is a 77 y.o. year old male with multiple medical problems including squamous cell carcinoma of right lung, heart failure, aortic valve regurgitation, atrial fibrillation, v-tach s/p Medtonic AICD/PPM, systolic and diastolic congestive heart failure, cad s/p angioplasty, cva, h/o AAA, esophagus, heart murmur, hypertension, hyperlipidemia, nodular prostate with urinary obstruction, renal insufficiency, gout, gerd, diverticulosis, history of alcohol abuse,, port-a-cath insertion, right inguinal hernia repair, carpal tunnel release, cataract extraction. Hospitalize 11 / 13 / 2019 to 11 / 17 / 2019 for head injury with laceration following syncope quit CT showing no acute findings. He was found to have multiple PVCs with ventricular tachycardia and did not recall his AICD firing. He was placed on amiodarone drip then changed to oral after Cardiology followed. Hyponatremia secondary to lung cancer chronic. He did receive two cycles of carb/taxol for stage 3 lung cancer. He was continued on coumadin for atrial fibrillation. Hospitalized 11 / 22 / 2019 to 12 / 2 / 2019 for shortness of breath, acute on chronic congestive heart failure. Workup demonstrated hyponatremia, acute renal failure with hyperkalemia, hypovolemic, hypochloremic, transaminasemia, hepatocellular injury with impaired liver synthetic  function, liver failure, hyperammonemia w/o encephalopathy, sub-therapeutic INR, bradycardia with heart rate 40s, elevated lactic acid. He was admitted to ICU for elevated liver enzymes AST was 3956, alt 2317 with elevated INR. Gastroenterology consulted with Cardiology and oncology for lung cancer. He was hydrated for ischemic hepatitis with liver enzymes trending down. Committed held. Seen by nephrology. Lasix held secondary to acute on chronic renal failure. He did receive a bicarb drip during ICU admission. Atrial fibrillation brake control of beta blocker and cardizem added. He did have Echo showed severe reduce systolic function with ef 20 to 25%, diffuse hypokinesis. No ischemic workup recommended by Cardiology. Oncology deferred further chemotherapy considering the poor cardiopulmonary status. Coumadin was resumed. Electrolytes replaced. Recommendation to continue medical management for right sided heart failure with congestive hepatopathy. He received Granix injection for neutropenia and leukopenia which resolved that time of discharge. He was seen by palliative medicine during hospitalization . He wasn't able to care for himself and family opted for [place] at [facility]. He was seen by primary provider at [facility] 12 / 12 / 2019. He is working with PT/OT on General strengthening and able to ambulate 50 feet with a walker. Mental status has improved. Recent kidney panel within normal limits. Liver panel close to normal. Blood pressure has been stable. He completed the course of Macrobid for UTI. Continues with edema lower extremity and left arm remaining on Lasix. He does require assistance with ADLs and transfers. He does feed himself with appetite fair and regular bowel movements. At present he is sitting in the wheelchair in his  room. He appears chronically ill but comfortable. His daughter Tilda Burrow and wife are with him.. Palliative Care was asked to help address goals of care.    Married lives at home with his wife with two adult daughters from a previous marriage. 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 Wife is health care power-of-attorney and has a living will.  CODE STATUS: DNR  PPS: 40% HOSPICE ELIGIBILITY/DIAGNOSIS: TBD; hospice screening upon d/c from [facility]  PAST MEDICAL HISTORY:  Past Medical History:  Diagnosis Date  . AICD (automatic cardioverter/defibrillator) present   . Aortic valve regurgitation   . Atrial fibrillation ([place])   . Barrett's esophagus   . Cancer ([place])    sate III lung  . CHF (congestive heart failure) ([place])   . COPD (chronic obstructive pulmonary disease) ([place])   . Diverticulosis   . Dysrhythmia   . GERD (gastroesophageal reflux disease)   . Gout   . Headache    aura only  . Heart murmur   . Hematuria   . Hernia of abdominal cavity   . History of prosthetic heart valve   . Hyperlipidemia   . Hypertension   . Nodular prostate with urinary obstruction   . Presence of permanent cardiac pacemaker   . Renal insufficiency   . Substance abuse ([place])    alcohol    SOCIAL HX:  Social History   Tobacco Use  . Smoking status: Former Smoker    Types: Cigarettes    Last attempt to quit: 04/24/1991    Years since quitting: 27.5  . Smokeless tobacco: Never Used  Substance Use Topics  . Alcohol use: Yes    Alcohol/week: 2.0 standard drinks    Types: 2 Shots of liquor per week    Comment: quit December 2015/restarted-2 drinks per night    ALLERGIES:  Allergies  Allergen Reactions  . Prednisone Other (See Comments)    Reaction: "organs shutting down"     PERTINENT MEDICATIONS:  Outpatient Encounter Medications as of 11/10/2018  Medication Sig  . aspirin EC 81 MG tablet Take 81 mg by mouth daily.  Marland Kitchen desonide (DESOWEN) 0.05 % cream Apply topically 2 (two) times daily.  Marland Kitchen diltiazem (CARDIZEM CD) 120 MG 24 hr capsule Take 1 capsule (120 mg total) by mouth daily.  .  finasteride (PROSCAR) 5 MG tablet TAKE 1 TABLET BY MOUTH ONCE DAILY (Patient taking differently: Take 5 mg by mouth daily. )  . fluticasone (FLONASE) 50 MCG/ACT nasal spray Place 2 sprays daily into both nostrils.  . furosemide (LASIX) 40 MG tablet Take 0.5 tablets (20 mg total) by mouth daily.  . Magnesium 400 MG TABS Take 400 mg by mouth daily.   . metoprolol tartrate (LOPRESSOR) 50 MG tablet Take 1 tablet (50 mg total) by mouth 2 (two) times daily.  . Multiple Vitamins-Minerals (CENTRUM ADULTS PO) Take 1 tablet by mouth daily.   Marland Kitchen omeprazole (PRILOSEC) 20 MG capsule Take 1 capsule (20 mg total) by mouth daily.  Vladimir Faster Glycol-Propyl Glycol (SYSTANE OP) Place 1 drop into both eyes as needed (for dry eyes).  Marland Kitchen senna-docusate (SENOKOT-S) 8.6-50 MG tablet Take 1 tablet by mouth at bedtime as needed for mild constipation.  Marland Kitchen tiotropium (SPIRIVA) 18 MCG inhalation capsule Place 1 capsule (18 mcg total) into inhaler and inhale daily.  Marland Kitchen warfarin (COUMADIN) 3 MG tablet TAKE 1 TABLET BY MOUTH ONCE DAILY. (Patient taking differently: Take 3 mg by mouth daily. )  Facility-Administered Encounter Medications as of 11/10/2018  Medication  . 0.9 %  sodium chloride infusion    PHYSICAL EXAM:   General: NAD, frail appearing, thin male, appears chronically ill Cardiovascular: regular rate and rhythm Pulmonary: clear ant fields Abdomen: soft, nontender, + bowel sounds GU: no suprapubic tenderness Extremities: + edema left forearm/hand; BLE, no joint deformities Skin: no rashes; pale Neurological: Weakness but otherwise nonfocal  Carlethia Mesquita Ihor Gully, NP

## 2018-11-13 ENCOUNTER — Ambulatory Visit: Payer: Medicare Other

## 2018-11-14 ENCOUNTER — Ambulatory Visit: Payer: Medicare Other

## 2018-11-14 ENCOUNTER — Telehealth: Payer: Self-pay

## 2018-11-14 DIAGNOSIS — Z952 Presence of prosthetic heart valve: Secondary | ICD-10-CM | POA: Diagnosis not present

## 2018-11-14 DIAGNOSIS — J449 Chronic obstructive pulmonary disease, unspecified: Secondary | ICD-10-CM | POA: Diagnosis not present

## 2018-11-14 DIAGNOSIS — C3491 Malignant neoplasm of unspecified part of right bronchus or lung: Secondary | ICD-10-CM | POA: Diagnosis not present

## 2018-11-14 DIAGNOSIS — I4891 Unspecified atrial fibrillation: Secondary | ICD-10-CM | POA: Diagnosis not present

## 2018-11-14 DIAGNOSIS — K219 Gastro-esophageal reflux disease without esophagitis: Secondary | ICD-10-CM | POA: Diagnosis not present

## 2018-11-14 DIAGNOSIS — I509 Heart failure, unspecified: Secondary | ICD-10-CM | POA: Diagnosis not present

## 2018-11-14 DIAGNOSIS — N183 Chronic kidney disease, stage 3 (moderate): Secondary | ICD-10-CM | POA: Diagnosis not present

## 2018-11-14 DIAGNOSIS — K759 Inflammatory liver disease, unspecified: Secondary | ICD-10-CM | POA: Diagnosis not present

## 2018-11-14 NOTE — Telephone Encounter (Signed)
Telephone call to patient's daughter Tilda Burrow after speaking with Christin, NP regarding their meeting this past Friday for goals of care. Deanna states they are interested in knowing more about hiring private care in the home once patient is discharged from the facility. Plan at this time according to Goldsby, NP and Deanna is for patient to be discharged home with hospice services. Provided Deanna information on how to go about hiring privately, and provided her with contact information for sitters. Gave this SW's contact information should any needs arise. Denanna verbalized appreciation.

## 2018-11-15 ENCOUNTER — Ambulatory Visit: Payer: Medicare Other

## 2018-11-16 ENCOUNTER — Ambulatory Visit: Payer: Medicare Other

## 2018-11-17 ENCOUNTER — Telehealth: Payer: Self-pay | Admitting: Nurse Practitioner

## 2018-11-17 ENCOUNTER — Ambulatory Visit: Payer: Medicare Other

## 2018-11-17 NOTE — Telephone Encounter (Signed)
I called Dimple Nanas, social worker at [employer], message left that will update on having to reschedule family meeting due to provider illness.   I called and left a message for Deanna concerning having to reschedule family meeting due to provide her illness  I called Gerald Stabs, Mr [facility] daughter. We discussed events over the last several days which Mr Knisley has been wanting to talk about end-of-life and his passing. Gerald Stabs endorses family has scheduled a big gathering with food, ice cream, music this weekend for Mr Laday to celebrate his life. We talked about progression, symptoms and upcoming discharge. We talked about discharge home with hospice screening and services provided. Questions answered dissatisfaction. Discuss with Gerald Stabs may follow up as needed. No need to reschedule family meeting at this time as he will be discharged home but hospice screening. Therapeutic listening and emotional support provided.  Total time spent 40 minutes Documentation 10 minutes Phone discussion 30 minutes

## 2018-11-19 ENCOUNTER — Inpatient Hospital Stay
Admission: EM | Admit: 2018-11-19 | Discharge: 2018-11-23 | DRG: 291 | Disposition: A | Discharge status: Hospice | Payer: Medicare Other | Attending: Internal Medicine | Admitting: Internal Medicine

## 2018-11-19 ENCOUNTER — Emergency Department: Payer: Medicare Other

## 2018-11-19 ENCOUNTER — Encounter: Payer: Self-pay | Admitting: Emergency Medicine

## 2018-11-19 DIAGNOSIS — I509 Heart failure, unspecified: Secondary | ICD-10-CM

## 2018-11-19 DIAGNOSIS — I5023 Acute on chronic systolic (congestive) heart failure: Secondary | ICD-10-CM | POA: Diagnosis present

## 2018-11-19 DIAGNOSIS — E611 Iron deficiency: Secondary | ICD-10-CM | POA: Diagnosis present

## 2018-11-19 DIAGNOSIS — R4182 Altered mental status, unspecified: Secondary | ICD-10-CM

## 2018-11-19 DIAGNOSIS — Z7951 Long term (current) use of inhaled steroids: Secondary | ICD-10-CM | POA: Diagnosis not present

## 2018-11-19 DIAGNOSIS — R0689 Other abnormalities of breathing: Secondary | ICD-10-CM | POA: Diagnosis not present

## 2018-11-19 DIAGNOSIS — J9601 Acute respiratory failure with hypoxia: Secondary | ICD-10-CM | POA: Diagnosis present

## 2018-11-19 DIAGNOSIS — Z7401 Bed confinement status: Secondary | ICD-10-CM | POA: Diagnosis not present

## 2018-11-19 DIAGNOSIS — J810 Acute pulmonary edema: Secondary | ICD-10-CM | POA: Diagnosis not present

## 2018-11-19 DIAGNOSIS — Z66 Do not resuscitate: Secondary | ICD-10-CM | POA: Diagnosis present

## 2018-11-19 DIAGNOSIS — Z515 Encounter for palliative care: Secondary | ICD-10-CM | POA: Diagnosis present

## 2018-11-19 DIAGNOSIS — Z7189 Other specified counseling: Secondary | ICD-10-CM | POA: Diagnosis not present

## 2018-11-19 DIAGNOSIS — J9811 Atelectasis: Secondary | ICD-10-CM | POA: Diagnosis present

## 2018-11-19 DIAGNOSIS — Z923 Personal history of irradiation: Secondary | ICD-10-CM

## 2018-11-19 DIAGNOSIS — R0902 Hypoxemia: Secondary | ICD-10-CM | POA: Diagnosis not present

## 2018-11-19 DIAGNOSIS — J9600 Acute respiratory failure, unspecified whether with hypoxia or hypercapnia: Secondary | ICD-10-CM | POA: Diagnosis not present

## 2018-11-19 DIAGNOSIS — Z794 Long term (current) use of insulin: Secondary | ICD-10-CM

## 2018-11-19 DIAGNOSIS — Z95810 Presence of automatic (implantable) cardiac defibrillator: Secondary | ICD-10-CM

## 2018-11-19 DIAGNOSIS — Z955 Presence of coronary angioplasty implant and graft: Secondary | ICD-10-CM | POA: Diagnosis not present

## 2018-11-19 DIAGNOSIS — M2550 Pain in unspecified joint: Secondary | ICD-10-CM | POA: Diagnosis not present

## 2018-11-19 DIAGNOSIS — C3490 Malignant neoplasm of unspecified part of unspecified bronchus or lung: Secondary | ICD-10-CM | POA: Diagnosis present

## 2018-11-19 DIAGNOSIS — N179 Acute kidney failure, unspecified: Secondary | ICD-10-CM | POA: Diagnosis present

## 2018-11-19 DIAGNOSIS — J440 Chronic obstructive pulmonary disease with acute lower respiratory infection: Secondary | ICD-10-CM | POA: Diagnosis present

## 2018-11-19 DIAGNOSIS — J9602 Acute respiratory failure with hypercapnia: Secondary | ICD-10-CM | POA: Diagnosis present

## 2018-11-19 DIAGNOSIS — J9621 Acute and chronic respiratory failure with hypoxia: Secondary | ICD-10-CM | POA: Diagnosis not present

## 2018-11-19 DIAGNOSIS — N183 Chronic kidney disease, stage 3 (moderate): Secondary | ICD-10-CM | POA: Diagnosis present

## 2018-11-19 DIAGNOSIS — Z87891 Personal history of nicotine dependence: Secondary | ICD-10-CM

## 2018-11-19 DIAGNOSIS — Z952 Presence of prosthetic heart valve: Secondary | ICD-10-CM | POA: Diagnosis not present

## 2018-11-19 DIAGNOSIS — Z7901 Long term (current) use of anticoagulants: Secondary | ICD-10-CM | POA: Diagnosis not present

## 2018-11-19 DIAGNOSIS — R0602 Shortness of breath: Secondary | ICD-10-CM | POA: Diagnosis not present

## 2018-11-19 DIAGNOSIS — Z7902 Long term (current) use of antithrombotics/antiplatelets: Secondary | ICD-10-CM

## 2018-11-19 DIAGNOSIS — E785 Hyperlipidemia, unspecified: Secondary | ICD-10-CM | POA: Diagnosis present

## 2018-11-19 DIAGNOSIS — I130 Hypertensive heart and chronic kidney disease with heart failure and stage 1 through stage 4 chronic kidney disease, or unspecified chronic kidney disease: Secondary | ICD-10-CM | POA: Diagnosis present

## 2018-11-19 DIAGNOSIS — Z9221 Personal history of antineoplastic chemotherapy: Secondary | ICD-10-CM

## 2018-11-19 DIAGNOSIS — K219 Gastro-esophageal reflux disease without esophagitis: Secondary | ICD-10-CM | POA: Diagnosis present

## 2018-11-19 DIAGNOSIS — R609 Edema, unspecified: Secondary | ICD-10-CM | POA: Diagnosis not present

## 2018-11-19 DIAGNOSIS — R404 Transient alteration of awareness: Secondary | ICD-10-CM | POA: Diagnosis not present

## 2018-11-19 LAB — URINALYSIS, COMPLETE (UACMP) WITH MICROSCOPIC
Bilirubin Urine: NEGATIVE
Glucose, UA: NEGATIVE mg/dL
Hgb urine dipstick: NEGATIVE
Ketones, ur: NEGATIVE mg/dL
Leukocytes, UA: NEGATIVE
Nitrite: NEGATIVE
Protein, ur: NEGATIVE mg/dL
Specific Gravity, Urine: 1.011 (ref 1.005–1.030)
pH: 5 (ref 5.0–8.0)

## 2018-11-19 LAB — PROTIME-INR
INR: 2.96
Prothrombin Time: 30.4 seconds — ABNORMAL HIGH (ref 11.4–15.2)

## 2018-11-19 LAB — CBC WITH DIFFERENTIAL/PLATELET
Abs Immature Granulocytes: 0.09 10*3/uL — ABNORMAL HIGH (ref 0.00–0.07)
Basophils Absolute: 0 10*3/uL (ref 0.0–0.1)
Basophils Relative: 0 %
Eosinophils Absolute: 0 10*3/uL (ref 0.0–0.5)
Eosinophils Relative: 0 %
HCT: 30 % — ABNORMAL LOW (ref 39.0–52.0)
Hemoglobin: 9.2 g/dL — ABNORMAL LOW (ref 13.0–17.0)
Immature Granulocytes: 1 %
Lymphocytes Relative: 6 %
Lymphs Abs: 0.7 10*3/uL (ref 0.7–4.0)
MCH: 30.5 pg (ref 26.0–34.0)
MCHC: 30.7 g/dL (ref 30.0–36.0)
MCV: 99.3 fL (ref 80.0–100.0)
Monocytes Absolute: 1 10*3/uL (ref 0.1–1.0)
Monocytes Relative: 9 %
Neutro Abs: 9.4 10*3/uL — ABNORMAL HIGH (ref 1.7–7.7)
Neutrophils Relative %: 84 %
Platelets: 331 10*3/uL (ref 150–400)
RBC: 3.02 MIL/uL — ABNORMAL LOW (ref 4.22–5.81)
RDW: 21.5 % — ABNORMAL HIGH (ref 11.5–15.5)
SMEAR REVIEW: NORMAL
WBC: 11.2 10*3/uL — ABNORMAL HIGH (ref 4.0–10.5)
nRBC: 0.3 % — ABNORMAL HIGH (ref 0.0–0.2)

## 2018-11-19 LAB — BLOOD GAS, VENOUS
Acid-Base Excess: 11.9 mmol/L — ABNORMAL HIGH (ref 0.0–2.0)
BICARBONATE: 41.7 mmol/L — AB (ref 20.0–28.0)
O2 Saturation: 78.8 %
Patient temperature: 37
pCO2, Ven: 81 mmHg (ref 44.0–60.0)
pH, Ven: 7.32 (ref 7.250–7.430)
pO2, Ven: 47 mmHg — ABNORMAL HIGH (ref 32.0–45.0)

## 2018-11-19 LAB — COMPREHENSIVE METABOLIC PANEL
ALT: 17 U/L (ref 0–44)
ANION GAP: 8 (ref 5–15)
AST: 28 U/L (ref 15–41)
Albumin: 2.1 g/dL — ABNORMAL LOW (ref 3.5–5.0)
Alkaline Phosphatase: 81 U/L (ref 38–126)
BUN: 50 mg/dL — ABNORMAL HIGH (ref 8–23)
CO2: 37 mmol/L — ABNORMAL HIGH (ref 22–32)
Calcium: 8.1 mg/dL — ABNORMAL LOW (ref 8.9–10.3)
Chloride: 88 mmol/L — ABNORMAL LOW (ref 98–111)
Creatinine, Ser: 1.58 mg/dL — ABNORMAL HIGH (ref 0.61–1.24)
GFR calc Af Amer: 48 mL/min — ABNORMAL LOW (ref >60)
GFR calc non Af Amer: 42 mL/min — ABNORMAL LOW (ref >60)
Glucose, Bld: 174 mg/dL — ABNORMAL HIGH (ref 70–99)
POTASSIUM: 3.8 mmol/L (ref 3.5–5.1)
Sodium: 133 mmol/L — ABNORMAL LOW (ref 135–145)
Total Bilirubin: 1.1 mg/dL (ref 0.3–1.2)
Total Protein: 5.3 g/dL — ABNORMAL LOW (ref 6.5–8.1)

## 2018-11-19 LAB — TROPONIN I: Troponin I: 0.06 ng/mL (ref <0.03)

## 2018-11-19 LAB — GLUCOSE, CAPILLARY: Glucose-Capillary: 115 mg/dL — ABNORMAL HIGH (ref 70–99)

## 2018-11-19 LAB — INFLUENZA PANEL BY PCR (TYPE A & B)
INFLBPCR: NEGATIVE
Influenza A By PCR: NEGATIVE

## 2018-11-19 LAB — LACTIC ACID, PLASMA
LACTIC ACID, VENOUS: 1.2 mmol/L (ref 0.5–1.9)
Lactic Acid, Venous: 1.6 mmol/L (ref 0.5–1.9)

## 2018-11-19 LAB — BRAIN NATRIURETIC PEPTIDE: B Natriuretic Peptide: 3006 pg/mL — ABNORMAL HIGH (ref 0.0–100.0)

## 2018-11-19 LAB — APTT: aPTT: 50 seconds — ABNORMAL HIGH (ref 24–36)

## 2018-11-19 MED ORDER — ALBUTEROL SULFATE (2.5 MG/3ML) 0.083% IN NEBU: 2.5000 mg | INHALATION_SOLUTION | RESPIRATORY_TRACT | Status: DC | PRN

## 2018-11-19 MED ORDER — SODIUM CHLORIDE 0.9% FLUSH
3.0000 mL | Freq: Two times a day (BID) | INTRAVENOUS | Status: DC
  Administered 2018-11-20 – 2018-11-22 (×6): 3 mL via INTRAVENOUS
  Administered 2018-11-23: 11:00:00 10 mL via INTRAVENOUS

## 2018-11-19 MED ORDER — TIOTROPIUM BROMIDE MONOHYDRATE 18 MCG IN CAPS
18.0000 ug | ORAL_CAPSULE | Freq: Every day | RESPIRATORY_TRACT | Status: DC
  Filled 2018-11-19: qty 5

## 2018-11-19 MED ORDER — FLUTICASONE PROPIONATE 50 MCG/ACT NA SUSP
2.0000 | Freq: Every day | NASAL | Status: DC
  Filled 2018-11-19: qty 16

## 2018-11-19 MED ORDER — ASPIRIN EC 81 MG PO TBEC: 81.0000 mg | DELAYED_RELEASE_TABLET | Freq: Every day | ORAL | Status: DC

## 2018-11-19 MED ORDER — DOCUSATE SODIUM 100 MG PO CAPS: 100.0000 mg | ORAL_CAPSULE | Freq: Two times a day (BID) | ORAL | Status: DC

## 2018-11-19 MED ORDER — PANTOPRAZOLE SODIUM 40 MG PO TBEC: 40.0000 mg | DELAYED_RELEASE_TABLET | Freq: Every day | ORAL | Status: DC

## 2018-11-19 MED ORDER — ALPRAZOLAM 0.25 MG PO TABS: 0.2500 mg | ORAL_TABLET | Freq: Two times a day (BID) | ORAL | Status: DC | PRN

## 2018-11-19 MED ORDER — FUROSEMIDE 10 MG/ML IJ SOLN
20.0000 mg | Freq: Two times a day (BID) | INTRAMUSCULAR | Status: DC
  Administered 2018-11-20: 20 mg via INTRAVENOUS
  Filled 2018-11-19: qty 2

## 2018-11-19 MED ORDER — GUAIFENESIN ER 600 MG PO TB12: 600.0000 mg | ORAL_TABLET | Freq: Two times a day (BID) | ORAL | Status: DC

## 2018-11-19 MED ORDER — ONDANSETRON HCL 4 MG PO TABS: 4.0000 mg | ORAL_TABLET | Freq: Four times a day (QID) | ORAL | Status: DC | PRN

## 2018-11-19 MED ORDER — SODIUM CHLORIDE 0.9 % IV BOLUS
250.0000 mL | Freq: Once | INTRAVENOUS | Status: AC
  Administered 2018-11-19: 250 mL via INTRAVENOUS

## 2018-11-19 MED ORDER — FINASTERIDE 5 MG PO TABS
5.0000 mg | ORAL_TABLET | Freq: Every day | ORAL | Status: DC
  Administered 2018-11-20 – 2018-11-23 (×4): 5 mg via ORAL
  Filled 2018-11-19 (×4): qty 1

## 2018-11-19 MED ORDER — MAGNESIUM OXIDE 400 (241.3 MG) MG PO TABS: 800.0000 mg | ORAL_TABLET | Freq: Three times a day (TID) | ORAL | Status: DC

## 2018-11-19 MED ORDER — ONDANSETRON HCL 4 MG/2ML IJ SOLN: 4.0000 mg | Freq: Four times a day (QID) | INTRAMUSCULAR | Status: DC | PRN

## 2018-11-19 MED ORDER — SENNOSIDES-DOCUSATE SODIUM 8.6-50 MG PO TABS: 1.0000 | ORAL_TABLET | Freq: Every evening | ORAL | Status: DC | PRN

## 2018-11-19 NOTE — Progress Notes (Signed)
Found vbg on ice in lab. Rt on dayshift not notified. Sample ran and results reported to Dr Cherylann Banas.

## 2018-11-19 NOTE — ED Notes (Signed)
Took patient's wife to the room

## 2018-11-19 NOTE — Progress Notes (Signed)
eLink Physician-Brief Progress Note Patient Name: Kirk Jones [facility] DOB: 05-15-1941 MRN: [id number]   Date of Service  11/19/2018  HPI/Events of Note  77 year old male with PMH of atrial fibrillation, aortic valve regurgitation, Barrett's esophagus, stage III lung cancer, chronic systolic congestive heart failure, COPD, GERD, gout, hyperlipidemia, hypertension presents with worsening shortness of breath and hypoxia. Felt to be d/t CHF and +/- AECOPD. Now on BiPAP. PCCM asked to assume care in ICU. VSS.   eICU Interventions  No new orders.      Intervention Category Evaluation Type: New Patient Evaluation  Lysle Dingwall 11/19/2018, 11:22 PM

## 2018-11-19 NOTE — ED Provider Notes (Signed)
[facility] Emergency Department Provider Note ____________________________________________   First MD Initiated Contact with Patient 11/19/18 1812     (approximate)  I have reviewed the triage vital signs and the nursing notes.   HISTORY  Chief Complaint Shortness of Breath  Level 5 caveat: History of present illness limited due to altered mental status  HPI Kirk Jones is a 77 y.o. male with PMH as noted below who presents with worsening shortness of breath acutely today, as well as gradually worsening confusion and generalized weakness over the last several days.  Per EMS the patient had an O2 saturation of 90% on 3 L.  He is on home oxygen at all times.  He has a history of lung cancer with treatment several weeks ago.  Past Medical History:  Diagnosis Date  . AICD (automatic cardioverter/defibrillator) present   . Aortic valve regurgitation   . Atrial fibrillation ([place])   . Barrett's esophagus   . Cancer ([place])    sate III lung  . CHF (congestive heart failure) ([place])   . COPD (chronic obstructive pulmonary disease) ([place])   . Diverticulosis   . Dysrhythmia   . GERD (gastroesophageal reflux disease)   . Gout   . Headache    aura only  . Heart murmur   . Hematuria   . Hernia of abdominal cavity   . History of prosthetic heart valve   . Hyperlipidemia   . Hypertension   . Nodular prostate with urinary obstruction   . Presence of permanent cardiac pacemaker   . Renal insufficiency   . Substance abuse ([place])    alcohol    Patient Active Problem List   Diagnosis Date Noted  . CHF (congestive heart failure) ([place]) 11/19/2018  . Anorexia 11/10/2018  . Dyspnea on exertion 11/10/2018  . Weakness generalized 11/10/2018  . Chemotherapy-induced neutropenia ([place])   . Thrombocytopenia ([place])   . Unstageable pressure ulcer of sacral region ([place]) 10/25/2018  . Generalized weakness   . Acute on chronic congestive heart failure ([place])   .  Transaminasemia 10/21/2018  . Hyperkalemia   . Supratherapeutic INR   . Palliative care encounter   . Syncope 10/12/2018  . Pressure injury of skin 10/12/2018  . Squamous cell carcinoma of right lung ([place]) 09/16/2018  . Lung mass 08/22/2018  . Exertional shortness of breath 06/07/2018  . Peripheral edema 06/07/2018  . Allergic rhinitis 02/12/2018  . Advanced care planning/counseling discussion 10/19/2017  . Expressive aphasia 06/13/2017  . Hx of adenomatous colonic polyps 02/18/2017  . Bilateral cataracts 08/18/2016  . CAD (coronary artery disease) 10/06/2015  . Pacemaker 10/06/2015  . Automatic implantable cardioverter-defibrillator in situ 10/06/2015  . Nodular prostate with urinary obstruction 10/06/2015  . Senile purpura ([place]) 10/06/2015  . Barrett's esophagus 05/28/2015  . Hypertension 05/28/2015  . COPD (chronic obstructive pulmonary disease) ([place]) 05/28/2015  . Hyperlipidemia 05/28/2015  . Diverticulosis 05/28/2015  . CKD (chronic kidney disease), stage III ([place]) 05/28/2015  . Gout 05/28/2015  . History of prosthetic heart valve 05/13/2015  . Atrial fibrillation ([place]) 05/13/2015  . CVA (cerebral vascular accident) ([place]) 07/05/2014  . Systolic congestive heart failure ([place]) 07/05/2014  . History of TIA (transient ischemic attack) 02/12/2013  . Warfarin anticoagulation 02/12/2013    Past Surgical History:  Procedure Laterality Date  . CARDIAC VALVE REPLACEMENT N/A 2004  . CARPAL TUNNEL RELEASE Right 2015  . CATARACT EXTRACTION Right 2017  . COLONOSCOPY WITH PROPOFOL N/A 2014  . CORONARY ANGIOPLASTY  stints x2  . EYE SURGERY     cross eyed  . FOOT SURGERY Bilateral    [place]  . FRACTURE SURGERY Right    [place]  . INGUINAL HERNIA REPAIR Right 09/16/2017   Procedure: HERNIA REPAIR INGUINAL ADULT;  Surgeon: Christene Lye, MD;  Location: [facility] ORS;  Service: General;  Laterality: Right;  . INSERT / REPLACE / [place]     ICD  . PACEMAKER IMPLANT  Left 2013   and ICD  . PORTA CATH INSERTION N/A 09/21/2018   Procedure: PORTA CATH INSERTION;  Surgeon: Algernon Huxley, MD;  Location: [place] CV LAB;  Service: Cardiovascular;  Laterality: N/A;  . VASECTOMY      Prior to Admission medications   Medication Sig Start Date End Date Taking? Authorizing Provider  albuterol (PROVENTIL) (2.5 MG/3ML) 0.083% nebulizer solution Take 2.5 mg by nebulization every 3 (three) hours as needed for wheezing or shortness of breath.   Yes [provider]  ALPRAZolam (XANAX) 0.25 MG tablet Take 0.25 mg by mouth 2 (two) times daily as needed for anxiety.   Yes [provider]  aspirin EC 81 MG tablet Take 81 mg by mouth daily.   Yes [provider]  desonide (DESOWEN) 0.05 % cream Apply topically 2 (two) times daily. 03/30/18  Yes Volney American, PA-C  diltiazem (CARDIZEM SR) 60 MG 12 hr capsule Take 60 mg by mouth daily.   Yes [provider]  finasteride (PROSCAR) 5 MG tablet TAKE 1 TABLET BY MOUTH ONCE DAILY Patient taking differently: Take 5 mg by mouth daily.  07/12/18  Yes Volney American, PA-C  fluticasone [facility]) 50 MCG/ACT nasal spray Place 2 sprays daily into both nostrils. 10/14/17  Yes Crissman, Jeannette How, MD  furosemide (LASIX) 40 MG tablet Take 0.5 tablets (20 mg total) by mouth daily. Patient taking differently: Take 20 mg by mouth 2 (two) times daily.  10/30/18 11/29/18 Yes Pyreddy, Reatha Harps, MD  magnesium oxide (MAG-OX) 400 MG tablet Take 800 mg by mouth 3 (three) times daily.   Yes [provider]  metoprolol tartrate (LOPRESSOR) 50 MG tablet Take 1 tablet (50 mg total) by mouth 2 (two) times daily. 10/30/18 11/29/18 Yes Pyreddy, Reatha Harps, MD  Multiple Vitamins-Minerals (CENTRUM ADULTS PO) Take 1 tablet by mouth daily.    Yes [provider]  naproxen (NAPROSYN) 500 MG tablet Take 500 mg by mouth every 8 (eight) hours as needed for mild pain or moderate pain.   Yes [provider]    omeprazole (PRILOSEC) 20 MG capsule Take 1 capsule (20 mg total) by mouth daily. 10/19/17  Yes Crissman, Jeannette How, MD  Polyethyl Glycol-Propyl Glycol (SYSTANE OP) Place 1 drop into both eyes as needed (for dry eyes).   Yes [provider]  senna-docusate (SENOKOT-S) 8.6-50 MG tablet Take 1 tablet by mouth at bedtime as needed for mild constipation. 10/30/18  Yes Pyreddy, Reatha Harps, MD  tiotropium (SPIRIVA) 18 MCG inhalation capsule Place 1 capsule (18 mcg total) into inhaler and inhale daily. 10/19/17  Yes Crissman, Jeannette How, MD  vitamin C (ASCORBIC ACID) 250 MG tablet Take 500 mg by mouth 2 (two) times daily.   Yes [provider]  warfarin (COUMADIN) 2 MG tablet Take 2 mg by mouth every evening.   Yes [provider]  diltiazem (CARDIZEM CD) 120 MG 24 hr capsule Take 1 capsule (120 mg total) by mouth daily. Patient not taking: Reported on 11/19/2018 10/30/18 11/29/18  Saundra Shelling, MD  warfarin (COUMADIN) 3 MG tablet TAKE 1 TABLET BY MOUTH ONCE DAILY. Patient not taking: No sig reported 06/21/18   Guadalupe Maple, MD    Allergies Prednisone  Family History  Problem Relation Age of Onset  . Heart attack Mother   . Diabetes Maternal Grandmother   . Lung cancer Sister     Social History Social History   Tobacco Use  . Smoking status: Former Smoker    Types: Cigarettes    Last attempt to quit: 04/24/1991    Years since quitting: 27.5  . Smokeless tobacco: Never Used  Substance Use Topics  . Alcohol use: Yes    Alcohol/week: 2.0 standard drinks    Types: 2 Shots of liquor per week    Comment: quit December 2015/restarted-2 drinks per night  . Drug use: No    Review of Systems Level 5 caveat: Unable to obtain review of systems due to altered mental status    ____________________________________________   PHYSICAL EXAM:  VITAL SIGNS: ED Triage Vitals  Enc Vitals Group     BP 11/19/18 1757 (!) 107/59     Pulse Rate 11/19/18 1757 (!) 102     Resp  11/19/18 1757 (!) 30     Temp 11/19/18 1757 98.3 F (36.8 C)     Temp Source 11/19/18 1757 Oral     SpO2 11/19/18 1757 98 %     Weight 11/19/18 1802 153 lb (69.4 kg)     Height 11/19/18 1802 5\' 3"  (1.6 m)     Head Circumference --      Peak Flow --      Pain Score 11/19/18 1802 0     Pain Loc --      Pain Edu? --      Excl. in [place]? --     Constitutional: Somewhat weak and lethargic appearing but answering questions and oriented.  Chronically ill-appearing but in no acute distress  Eyes: Conjunctivae are normal.  Head: Atraumatic. Nose: No congestion/rhinnorhea. Mouth/Throat: Mucous membranes are moist.   Neck: Normal range of motion.  Cardiovascular: Normal rate, irregular rhythm. Grossly normal heart sounds.  Good peripheral circulation. Respiratory: Normal respiratory effort.  No retractions.  Decreased breath sounds and some coarse breath sounds and scattered wheezes bilaterally. Gastrointestinal: Soft and nontender. No distention.  Genitourinary: No flank tenderness. Musculoskeletal: 3+ bilateral lower extremity edema.  Left arm with 3+ edema distal to elbow with fair erythema but no induration or abnormal warmth.  No fluctuance or tenderness.  Extremities warm and well perfused.  Neurologic: Somewhat slurred speech.  Motor intact in all extremities. Skin:  Skin is warm and dry. No rash noted. Psychiatric: Calm and cooperative.  ____________________________________________   LABS (all labs ordered are listed, but only abnormal results are displayed)  Labs Reviewed  CBC WITH DIFFERENTIAL/PLATELET - Abnormal; Notable for the following components:      Result Value   WBC 11.2 (*)    RBC 3.02 (*)    Hemoglobin 9.2 (*)    HCT 30.0 (*)    RDW 21.5 (*)    nRBC 0.3 (*)    Neutro Abs 9.4 (*)    Abs Immature Granulocytes 0.09 (*)    All other components within normal limits  COMPREHENSIVE METABOLIC PANEL - Abnormal; Notable for the following components:   Sodium 133 (*)     Chloride 88 (*)    CO2 37 (*)    Glucose, Bld 174 (*)    BUN 50 (*)  Creatinine, Ser 1.58 (*)    Calcium 8.1 (*)    Total Protein 5.3 (*)    Albumin 2.1 (*)    GFR calc non Af Amer 42 (*)    GFR calc Af Amer 48 (*)    All other components within normal limits  TROPONIN I - Abnormal; Notable for the following components:   Troponin I 0.06 (*)    All other components within normal limits  BRAIN NATRIURETIC PEPTIDE - Abnormal; Notable for the following components:   B Natriuretic Peptide 3,006.0 (*)    All other components within normal limits  URINALYSIS, COMPLETE (UACMP) WITH MICROSCOPIC - Abnormal; Notable for the following components:   Color, Urine YELLOW (*)    APPearance CLEAR (*)    Bacteria, UA RARE (*)    All other components within normal limits  BLOOD GAS, VENOUS - Abnormal; Notable for the following components:   pCO2, Ven 81 (*)    pO2, Ven 47.0 (*)    Bicarbonate 41.7 (*)    Acid-Base Excess 11.9 (*)    All other components within normal limits  APTT - Abnormal; Notable for the following components:   aPTT 50 (*)    All other components within normal limits  PROTIME-INR - Abnormal; Notable for the following components:   Prothrombin Time 30.4 (*)    All other components within normal limits  LACTIC ACID, PLASMA  LACTIC ACID, PLASMA  INFLUENZA PANEL BY PCR (TYPE A & B)   ____________________________________________  EKG  ED ECG REPORT I, Arta Silence, the attending physician, personally viewed and interpreted this ECG.  Date: 11/19/2018 EKG Time: 1759 Rate: 97 Rhythm: Atrial fibrillation with PVCs QRS Axis: normal Intervals: RBBB, LAFB ST/T Wave abnormalities: normal Narrative Interpretation: no evidence of acute ischemia; no acute change when compared to EKG of 10/26/2018  ____________________________________________  RADIOLOGY  CXR: Bilateral effusions and pulmonary edema consistent with  CHF  ____________________________________________   PROCEDURES  Procedure(s) performed: No  Procedures  Critical Care performed: Yes  CRITICAL CARE Performed by: Arta Silence   Total critical care time: 30 minutes  Critical care time was exclusive of separately billable procedures and treating other patients.  Critical care was necessary to treat or prevent imminent or life-threatening deterioration.  Critical care was time spent personally by me on the following activities: development of treatment plan with patient and/or surrogate as well as nursing, discussions with consultants, evaluation of patient's response to treatment, examination of patient, obtaining history from patient or surrogate, ordering and performing treatments and interventions, ordering and review of laboratory studies, ordering and review of radiographic studies, pulse oximetry and re-evaluation of patient's condition. ____________________________________________   INITIAL IMPRESSION / ASSESSMENT AND PLAN / ED COURSE  Pertinent labs & imaging results that were available during my care of the patient were reviewed by me and considered in my medical decision making (see chart for details).  77 year old male with PMH as noted above presents with acute onset of shortness of breath today, as well as gradual onset of worsening confusion and weakness over the last several days.  I reviewed the past medical records in Epic; the patient was last admitted to the hospital last month with shortness of breath and hyperkalemia, diagnosed with acute on chronic CHF.  He had left upper extremity DVT study 10/05/2018 which was negative.  On exam today the patient is chronically ill and weak appearing.  He has no acute respiratory distress, and his O2 saturation is in the low to  mid 90s on 3 L by nasal cannula.  The remainder of the exam is as described above.  Differential includes worsening CHF, pneumonia, viral  etiology, UTI or other infection, dehydration or electrolyte abnormality, hypercapnia, or primary cardiac etiology.  Will obtain chest x-ray, lab work-up, and reassess.  ----------------------------------------- 10:27 PM on 11/19/2018 -----------------------------------------  The patient's work-up overall is consistent with acute on chronic CHF.  Labs also suggest AKI, with somewhat worsened creatinine and BUN.  Chest x-ray shows findings consistent with CHF.  Initially after the work-up was resulted, the patient's blood pressure was borderline low so I do not want to give Lasix and cause a drop in his blood pressure.  I suspect that he is somewhat intravascularly volume depleted despite his peripheral and pulmonary edema.  The VBG resulted revealing elevated CO2 which I think explains his altered mental status.  However the patient is alert and responsive enough that I think he would benefit from BiPAP to help him ventilate.  The patient will require admission.  I signed him out to the hospitalist Dr. Posey Pronto. ____________________________________________   FINAL CLINICAL IMPRESSION(S) / ED DIAGNOSES  Final diagnoses:  Acute on chronic congestive heart failure, unspecified heart failure type ([place])  Acute kidney injury ([place])  Altered mental status, unspecified altered mental status type      NEW MEDICATIONS STARTED DURING THIS VISIT:  New Prescriptions   No medications on file     Note:  This document was prepared using Dragon voice recognition software and may include unintentional dictation errors.    Arta Silence, MD 11/19/18 2229

## 2018-11-19 NOTE — ED Notes (Signed)
Family at bedside, updated with POC  Pt positioned to left side

## 2018-11-19 NOTE — ED Notes (Signed)
RT called for transport

## 2018-11-19 NOTE — ED Notes (Signed)
ED TO INPATIENT HANDOFF REPORT  Name/Age/Gender Kirk Jones 77 y.o. male  Code Status Code Status History    Date Active Date Inactive Code Status Order ID Comments User Context   10/24/2018 1229 10/30/2018 1724 Full Code [id number]  Saundra Shelling, MD Inpatient   10/21/2018 0526 10/24/2018 1229 DNR [id number]  Arta Silence, MD ED   10/12/2018 0544 10/15/2018 2036 Full Code [id number]  Harrie Foreman, MD Inpatient   06/13/2017 2328 06/15/2017 1556 Full Code [id number]  Lance Coon, MD Inpatient    Advance Directive Documentation     Most Recent Value  Type of Advance Directive  Out of facility DNR (pink MOST or yellow form)  Pre-existing out of facility DNR order (yellow form or pink MOST form)  Pink MOST form placed in chart (order not valid for inpatient use)  "MOST" Form in Place?  -      Home/SNF/Other Rehab  Chief Complaint Difficulty breathing  Level of Care/Admitting Diagnosis ED Disposition    ED Disposition Condition [place]: [place] [[id number]]  Level of Care: Stepdown [14]  Diagnosis: CHF (congestive heart failure) [facility]) [[id number]]  Admitting Physician: Sedalia Muta [[id number]]  Attending Physician: Sedalia Muta [[id number]]  Estimated length of stay: 3 - 4 days  Certification:: I certify this patient will need inpatient services for at least 2 midnights  PT Class (Do Not Modify): Inpatient [101]  PT Acc Code (Do Not Modify): Private [1]       Medical History Past Medical History:  Diagnosis Date  . AICD (automatic cardioverter/defibrillator) present   . Aortic valve regurgitation   . Atrial fibrillation ([place])   . Barrett's esophagus   . Cancer ([place])    sate III lung  . CHF (congestive heart failure) ([place])   . COPD (chronic obstructive pulmonary disease) ([place])   . Diverticulosis   . Dysrhythmia   . GERD (gastroesophageal reflux disease)   . Gout   . Headache    aura only  . Heart murmur    . Hematuria   . Hernia of abdominal cavity   . History of prosthetic heart valve   . Hyperlipidemia   . Hypertension   . Nodular prostate with urinary obstruction   . Presence of permanent cardiac pacemaker   . Renal insufficiency   . Substance abuse (H[facility])    alcohol    Allergies Allergies  Allergen Reactions  . Prednisone Other (See Comments)    Reaction: "organs shutting down"    IV Location/Drains/Wounds Patient Lines/Drains/Airways Status   Active Line/Drains/Airways    Name:   Placement date:   Placement time:   Site:   Days:   Implanted Port Right Chest   -    -    Chest      Incision (Closed) 10/12/18 Face Right   10/12/18    0530     38   Pressure Injury 10/12/18 Stage II -  Partial thickness loss of dermis presenting as a shallow open ulcer with a red, pink wound bed without slough. small open and purplish area around the open skin   10/12/18    0530     38   Pressure Injury 10/23/18 Stage II -  Partial thickness loss of dermis presenting as a shallow open ulcer with a red, pink wound bed without slough.   10/23/18    2044     27          Labs/Imaging Results for  orders placed or performed during the hospital encounter of 11/19/18 (from the past 48 hour(s))  CBC with Differential     Status: Abnormal   Collection Time: 11/19/18  6:22 PM  Result Value Ref Range   WBC 11.2 (H) 4.0 - 10.5 K/uL   RBC 3.02 (L) 4.22 - 5.81 MIL/uL   Hemoglobin 9.2 (L) 13.0 - 17.0 g/dL   HCT 30.0 (L) 39.0 - 52.0 %   MCV 99.3 80.0 - 100.0 fL   MCH 30.5 26.0 - 34.0 pg   MCHC 30.7 30.0 - 36.0 g/dL   RDW 21.5 (H) 11.5 - 15.5 %   Platelets 331 150 - 400 K/uL   nRBC 0.3 (H) 0.0 - 0.2 %   Neutrophils Relative % 84 %   Neutro Abs 9.4 (H) 1.7 - 7.7 K/uL   Lymphocytes Relative 6 %   Lymphs Abs 0.7 0.7 - 4.0 K/uL   Monocytes Relative 9 %   Monocytes Absolute 1.0 0.1 - 1.0 K/uL   Eosinophils Relative 0 %   Eosinophils Absolute 0.0 0.0 - 0.5 K/uL   Basophils Relative 0 %   Basophils  Absolute 0.0 0.0 - 0.1 K/uL   WBC Morphology MORPHOLOGY UNREMARKABLE    Smear Review Normal platelet morphology    Immature Granulocytes 1 %   Abs Immature Granulocytes 0.09 (H) 0.00 - 0.07 K/uL   Dimorphism PRESENT    Polychromasia PRESENT    Target Cells PRESENT     Comment: Performed at [facility], [address] [place], [place] [address]  Comprehensive metabolic panel     Status: Abnormal   Collection Time: 11/19/18  6:22 PM  Result Value Ref Range   Sodium 133 (L) 135 - 145 mmol/L   Potassium 3.8 3.5 - 5.1 mmol/L   Chloride 88 (L) 98 - 111 mmol/L   CO2 37 (H) 22 - 32 mmol/L   Glucose, Bld 174 (H) 70 - 99 mg/dL   BUN 50 (H) 8 - 23 mg/dL   Creatinine, Ser 1.58 (H) 0.61 - 1.24 mg/dL   Calcium 8.1 (L) 8.9 - 10.3 mg/dL   Total Protein 5.3 (L) 6.5 - 8.1 g/dL   Albumin 2.1 (L) 3.5 - 5.0 g/dL   AST 28 15 - 41 U/L   ALT 17 0 - 44 U/L   Alkaline Phosphatase 81 38 - 126 U/L   Total Bilirubin 1.1 0.3 - 1.2 mg/dL   GFR calc non Af Amer 42 (L) >60 mL/min   GFR calc Af Amer 48 (L) >60 mL/min   Anion gap 8 5 - 15    Comment: Performed at [facility], [address] [place], [place] [address]  Troponin I - Once     Status: Abnormal   Collection Time: 11/19/18  6:22 PM  Result Value Ref Range   Troponin I 0.06 (HH) <0.03 ng/mL    Comment: CRITICAL RESULT CALLED TO, READ BACK BY AND VERIFIED WITH  TOM NAGY @1906  11/19/2018 TTG Performed at [facility], [address], [place], [place] [address]   Brain natriuretic peptide     Status: Abnormal   Collection Time: 11/19/18  6:23 PM  Result Value Ref Range   B Natriuretic Peptide 3,006.0 (H) 0.0 - 100.0 pg/mL    Comment: Performed at [facility], [address] [place], [place] [address]  Lactic acid, plasma     Status: None   Collection Time: 11/19/18  6:23 PM  Result Value Ref Range  Lactic Acid, Venous 1.6 0.5 - 1.9 mmol/L    Comment: Performed at [facility], [address] [place], [place] [address]  Urinalysis, Complete w Microscopic     Status: Abnormal   Collection Time: 11/19/18  6:23 PM  Result Value Ref Range   Color, Urine YELLOW (A) YELLOW   APPearance CLEAR (A) CLEAR   Specific Gravity, Urine 1.011 1.005 - 1.030   pH 5.0 5.0 - 8.0   Glucose, UA NEGATIVE NEGATIVE mg/dL   Hgb urine dipstick NEGATIVE NEGATIVE   Bilirubin Urine NEGATIVE NEGATIVE   Ketones, ur NEGATIVE NEGATIVE mg/dL   Protein, ur NEGATIVE NEGATIVE mg/dL   Nitrite NEGATIVE NEGATIVE   Leukocytes, UA NEGATIVE NEGATIVE   RBC / HPF 0-5 0 - 5 RBC/hpf   WBC, UA 0-5 0 - 5 WBC/hpf   Bacteria, UA RARE (A) NONE SEEN   Squamous Epithelial / LPF 0-5 0 - 5   Mucus PRESENT    Hyaline Casts, UA PRESENT     Comment: Performed at [facility], [address] [place], [place] [address]  Blood gas, venous     Status: Abnormal   Collection Time: 11/19/18  6:23 PM  Result Value Ref Range   pH, Ven 7.32 7.250 - 7.430   pCO2, Ven 81 (HH) 44.0 - 60.0 mmHg    Comment: CRITICAL RESULT, NOTIFIED PHYSICIAN DR Cherylann Banas [phone] 2130 DT    pO2, Ven 47.0 (H) 32.0 - 45.0 mmHg   Bicarbonate 41.7 (H) 20.0 - 28.0 mmol/L   Acid-Base Excess 11.9 (H) 0.0 - 2.0 mmol/L   O2 Saturation 78.8 %   Patient temperature 37.0    Collection site LINE    Sample type VENOUS     Comment: Performed at [facility], [address] [place], [place] [address]  APTT     Status: Abnormal   Collection Time: 11/19/18  6:23 PM  Result Value Ref Range   aPTT 50 (H) 24 - 36 seconds    Comment:        IF BASELINE aPTT IS ELEVATED, SUGGEST PATIENT RISK ASSESSMENT BE USED TO DETERMINE APPROPRIATE ANTICOAGULANT THERAPY. Performed at [facility], [address] [place], [place] [address]   Protime-INR     Status: Abnormal   Collection Time: 11/19/18  6:23 PM  Result Value Ref Range   Prothrombin Time 30.4 (H) 11.4 - 15.2 seconds   INR 2.96     Comment: Performed at [facility],  [address], [place], [place] [address]  Influenza panel by PCR (type A & B)     Status: None   Collection Time: 11/19/18  7:24 PM  Result Value Ref Range   Influenza A By PCR NEGATIVE NEGATIVE   Influenza B By PCR NEGATIVE NEGATIVE    Comment: (NOTE) The Xpert Xpress Flu assay is intended as an aid in the diagnosis of  influenza and should not be used as a sole basis for treatment.  This  assay is FDA approved for nasopharyngeal swab specimens only. Nasal  washings and aspirates are unacceptable for Xpert Xpress Flu testing. Performed at [facility], [address] [place], [place] [address]   Lactic acid, plasma     Status: None   Collection Time: 11/19/18  9:35 PM  Result Value Ref Range   Lactic Acid, Venous 1.2 0.5 - 1.9 mmol/L    Comment: Performed at [facility], [address], [place], [place] [address]   Dg  Chest Portable 1 View  Result Date: 11/19/2018 CLINICAL DATA:  Shortness of breath EXAM: PORTABLE CHEST 1 VIEW COMPARISON:  10/21/2018, CT 10/21/2018, radiograph 10/11/2018 FINDINGS: Left-sided pacing device as before. Post sternotomy changes. Right-sided central venous port tip over the SVC. Increased bilateral pleural effusions, right greater than left. Worsening airspace disease at both bases. Enlarged cardiomediastinal silhouette with increased vascular congestion and background edema. Aortic atherosclerosis. IMPRESSION: 1. Increasing bilateral right greater than left pleural effusions with worsening airspace disease at both bases. 2. Enlarged cardiomediastinal silhouette with increased vascular congestion and continued pulmonary edema. Electronically Signed   By: Donavan Foil M.D.   On: 11/19/2018 18:48    Pending Labs [employer] (From admission, onward)    Start     Ordered   Signed and Held  Troponin I - Now Then Q6H  Now then every 6 hours,   R     Signed and Held   Signed and Held  Comprehensive metabolic panel  Tomorrow morning,    R     Signed and Held   Signed and Held  CBC  Tomorrow morning,   R     Signed and Held          Vitals/Pain Today's Vitals   11/19/18 2035 11/19/18 2131 11/19/18 2132 11/19/18 2230  BP: (!) 99/56 106/66  96/61  Pulse: 86 82  100  Resp: (!) 22 (!) 24  (!) 26  Temp:      TempSrc:      SpO2: 97% 99%  99%  Weight:      Height:      PainSc: 2  0-No pain 0-No pain     Isolation Precautions No active isolations  Medications Medications  sodium chloride 0.9 % bolus 250 mL (0 mLs Intravenous Stopped 11/19/18 2130)    Mobility power wheelchair

## 2018-11-19 NOTE — H&P (Signed)
[place] [facility] [place] NAME: Kirk Jones    MR#:  [id number]  DATE OF BIRTH:  09-22-1941  DATE OF ADMISSION:  11/19/2018  PRIMARY CARE PHYSICIAN: Guadalupe Maple, MD   REQUESTING/REFERRING PHYSICIAN: Emergency room  CHIEF COMPLAINT:   Chief Complaint  Patient presents with  . Shortness of Breath    HISTORY OF PRESENT ILLNESS:  Kirk Jones  is a 77 y.o. male with a known history listed below presented to emergency room for evaluation of shortness of breath and low oxygen saturation.  Patient has chronic shortness of breath but last 2 to 3 days it is progressively worsening.  Patient has dry cough without any sputum production.  Patient noticed worsening of left upper extremity and bilateral lower extremity swelling with fluid oozing from skin.  Patient was recently hospitalized and went to short-term rehabilitation.  Patient denies chest pain or tightness.  No fever or chills.  No other complaints.  In emergency room patient has initial evaluation that showed elevated proBNP, elevated liver enzymes, elevated troponin.  X-ray chest suggestive of pulmonary edema.  Patient blood pressure is on lower side.  Patient also had acute kidney injury.  VBG done that showed elevated CO2.  ER physician will put patient on BiPAP.  Hospitalist team requested for admission.  PAST MEDICAL HISTORY:   Past Medical History:  Diagnosis Date  . AICD (automatic cardioverter/defibrillator) present   . Aortic valve regurgitation   . Atrial fibrillation ([place])   . Barrett's esophagus   . Cancer ([place])    sate III lung  . CHF (congestive heart failure) ([place])   . COPD (chronic obstructive pulmonary disease) ([place])   . Diverticulosis   . Dysrhythmia   . GERD (gastroesophageal reflux disease)   . Gout   . Headache    aura only  . Heart murmur   . Hematuria   . Hernia of abdominal cavity   . History of prosthetic heart valve   . Hyperlipidemia   .  Hypertension   . Nodular prostate with urinary obstruction   . Presence of permanent cardiac pacemaker   . Renal insufficiency   . Substance abuse ([place])    alcohol    PAST SURGICAL HISTORY:   Past Surgical History:  Procedure Laterality Date  . CARDIAC VALVE REPLACEMENT N/A 2004  . CARPAL TUNNEL RELEASE Right 2015  . CATARACT EXTRACTION Right 2017  . COLONOSCOPY WITH PROPOFOL N/A 2014  . CORONARY ANGIOPLASTY     stints x2  . EYE SURGERY     cross eyed  . FOOT SURGERY Bilateral    [place]  . FRACTURE SURGERY Right    [place]  . INGUINAL HERNIA REPAIR Right 09/16/2017   Procedure: HERNIA REPAIR INGUINAL ADULT;  Surgeon: Christene Lye, MD;  Location: [facility] ORS;  Service: General;  Laterality: Right;  . INSERT / REPLACE / [place]     ICD  . PACEMAKER IMPLANT Left 2013   and ICD  . PORTA CATH INSERTION N/A 09/21/2018   Procedure: PORTA CATH INSERTION;  Surgeon: Algernon Huxley, MD;  Location: [facility];  Service: Cardiovascular;  Laterality: N/A;  . VASECTOMY      SOCIAL HISTORY:   Social History   Tobacco Use  . Smoking status: Former Smoker    Types: Cigarettes    Last attempt to quit: 04/24/1991    Years since quitting: 27.5  . Smokeless tobacco: Never Used  Substance Use Topics  .  Alcohol use: Yes    Alcohol/week: 2.0 standard drinks    Types: 2 Shots of liquor per week    Comment: quit December 2015/restarted-2 drinks per night    FAMILY HISTORY:   Family History  Problem Relation Age of Onset  . Heart attack Mother   . Diabetes Maternal Grandmother   . Lung cancer Sister     DRUG ALLERGIES:   Allergies  Allergen Reactions  . Prednisone Other (See Comments)    Reaction: "organs shutting down"    REVIEW OF SYSTEMS:   ROS -12 point review of system reviewed positive as per HPI otherwise negative.  MEDICATIONS AT HOME:   Prior to Admission medications   Medication Sig Start Date End Date Taking? Authorizing Provider    albuterol (PROVENTIL) (2.5 MG/3ML) 0.083% nebulizer solution Take 2.5 mg by nebulization every 3 (three) hours as needed for wheezing or shortness of breath.   Yes [provider]  ALPRAZolam (XANAX) 0.25 MG tablet Take 0.25 mg by mouth 2 (two) times daily as needed for anxiety.   Yes [provider]  aspirin EC 81 MG tablet Take 81 mg by mouth daily.   Yes [provider]  desonide (DESOWEN) 0.05 % cream Apply topically 2 (two) times daily. 03/30/18  Yes Volney American, PA-C  diltiazem (CARDIZEM SR) 60 MG 12 hr capsule Take 60 mg by mouth daily.   Yes [provider]  finasteride (PROSCAR) 5 MG tablet TAKE 1 TABLET BY MOUTH ONCE DAILY Patient taking differently: Take 5 mg by mouth daily.  07/12/18  Yes Volney American, PA-C  fluticasone [facility]) 50 MCG/ACT nasal spray Place 2 sprays daily into both nostrils. 10/14/17  Yes Crissman, Jeannette How, MD  furosemide (LASIX) 40 MG tablet Take 0.5 tablets (20 mg total) by mouth daily. Patient taking differently: Take 20 mg by mouth 2 (two) times daily.  10/30/18 11/29/18 Yes Pyreddy, Reatha Harps, MD  magnesium oxide (MAG-OX) 400 MG tablet Take 800 mg by mouth 3 (three) times daily.   Yes [provider]  metoprolol tartrate (LOPRESSOR) 50 MG tablet Take 1 tablet (50 mg total) by mouth 2 (two) times daily. 10/30/18 11/29/18 Yes Pyreddy, Reatha Harps, MD  Multiple Vitamins-Minerals (CENTRUM ADULTS PO) Take 1 tablet by mouth daily.    Yes [provider]  naproxen (NAPROSYN) 500 MG tablet Take 500 mg by mouth every 8 (eight) hours as needed for mild pain or moderate pain.   Yes [provider]  omeprazole (PRILOSEC) 20 MG capsule Take 1 capsule (20 mg total) by mouth daily. 10/19/17  Yes Crissman, Jeannette How, MD  Polyethyl Glycol-Propyl Glycol (SYSTANE OP) Place 1 drop into both eyes as needed (for dry eyes).   Yes [provider]  senna-docusate (SENOKOT-S) 8.6-50 MG tablet Take 1 tablet by mouth at  bedtime as needed for mild constipation. 10/30/18  Yes Pyreddy, Reatha Harps, MD  tiotropium (SPIRIVA) 18 MCG inhalation capsule Place 1 capsule (18 mcg total) into inhaler and inhale daily. 10/19/17  Yes Crissman, Jeannette How, MD  vitamin C (ASCORBIC ACID) 250 MG tablet Take 500 mg by mouth 2 (two) times daily.   Yes [provider]  warfarin (COUMADIN) 2 MG tablet Take 2 mg by mouth every evening.   Yes [provider]  diltiazem (CARDIZEM CD) 120 MG 24 hr capsule Take 1 capsule (120 mg total) by mouth daily. Patient not taking: Reported on 11/19/2018 10/30/18 11/29/18  Saundra Shelling, MD  warfarin (COUMADIN) 3 MG tablet TAKE 1  TABLET BY MOUTH ONCE DAILY. Patient not taking: No sig reported 06/21/18   Guadalupe Maple, MD      VITAL SIGNS:  Blood pressure (!) 99/56, pulse 86, temperature 98.3 F (36.8 C), temperature source Oral, resp. rate (!) 22, height 5\' 3"  (1.6 m), weight 69.4 kg, SpO2 97 %.  PHYSICAL EXAMINATION:  Physical Exam  GENERAL:  77 y.o.-year-old patient lying in the bed with no acute distress.  frail-appearing EYES: Pupils equal, round, reactive to light and accommodation. No scleral icterus. Extraocular muscles intact.  HEENT: Head atraumatic, normocephalic. Oropharynx and nasopharynx clear.  NECK:  Supple, no jugular venous distention. No thyroid enlargement, no tenderness.  LUNGS: Bilateral crackles and coarse breath sound CARDIOVASCULAR: S1, S2 normal. No murmurs, rubs, or gallops.  ABDOMEN: Soft, nontender, nondistended. Bowel sounds present. No organomegaly or mass.  EXTREMITIES: Left upper extremity edematous with dressing.  Bilateral lower extremity dressing noted NEUROLOGIC: Generalized weakness, nonfocal PSYCHIATRIC: The patient is alert and oriented x 2-3.  SKIN: Sacrum skin breakdown, multiple areas of skin with dressing.  LABORATORY PANEL:   CBC Recent Labs  Lab 11/19/18 1822  WBC 11.2*  HGB 9.2*  HCT 30.0*  PLT 331    ------------------------------------------------------------------------------------------------------------------  Chemistries  Recent Labs  Lab 11/19/18 1822  NA 133*  K 3.8  CL 88*  CO2 37*  GLUCOSE 174*  BUN 50*  CREATININE 1.58*  CALCIUM 8.1*  AST 28  ALT 17  ALKPHOS 81  BILITOT 1.1   ------------------------------------------------------------------------------------------------------------------  Cardiac Enzymes Recent Labs  Lab 11/19/18 1822  TROPONINI 0.06*   ------------------------------------------------------------------------------------------------------------------  RADIOLOGY:  Dg Chest Portable 1 View  Result Date: 11/19/2018 CLINICAL DATA:  Shortness of breath EXAM: PORTABLE CHEST 1 VIEW COMPARISON:  10/21/2018, CT 10/21/2018, radiograph 10/11/2018 FINDINGS: Left-sided pacing device as before. Post sternotomy changes. Right-sided central venous port tip over the SVC. Increased bilateral pleural effusions, right greater than left. Worsening airspace disease at both bases. Enlarged cardiomediastinal silhouette with increased vascular congestion and background edema. Aortic atherosclerosis. IMPRESSION: 1. Increasing bilateral right greater than left pleural effusions with worsening airspace disease at both bases. 2. Enlarged cardiomediastinal silhouette with increased vascular congestion and continued pulmonary edema. Electronically Signed   By: Donavan Foil M.D.   On: 11/19/2018 18:48      IMPRESSION AND PLAN:   77 year old male patient with history of atrial fibrillation, aortic valve regurgitation, Barrett's esophagus, stage III lung cancer, chronic systolic congestive heart failure, COPD, GERD, gout, hyperlipidemia, hypertension presented with worsening shortness of breath and hypoxia.  1.  Acute hypoxic and hypercarbic respiratory failure: Patient will be started on BiPAP, admit to stepdown unit.  Secondary to CHF exacerbation.  Gentle diuresis as  blood pressure is on lower side.  Monitor clinically.  2.  Acute on chronic systolic CHF exacerbation: Gentle diuresis secondary to soft blood pressure.  Monitor input and output.  Daily weights.  Hold other blood pressure medications.  3.  Elevated LFT: Secondary to CHF exacerbation and hepatic congestion.  Continue to monitor.  Avoid hepatotoxic agents.  4.  Acute kidney injury on CKD 3 : Cardiorenal syndrome.  Possible intravascular depletion.  Monitor closely.  Avoid nephrotoxic agent.  5. Stage III lung cancer Further chemotherapy deferred in view of poor cardiopulmonary status by oncology  6. H/o Atrial fibrillation : Pharmacy to dose Coumadin.  Hold Cardizem secondary to low blood pressure.  7.  Chronic other medical problems: Monitor  DVT prophylaxis: On Coumadin  Overall poor prognosis secondary to multiorgan damage.  Palliative care consult requested.  High risk secondary to above    All the records are reviewed and case discussed with ED provider. Management plans discussed with the patient, family and they are in agreement.  CODE STATUS: DNR  TOTAL TIME TAKING CARE OF THIS PATIENT: 50 minutes.    Sedalia Muta M.D on 11/19/2018 at 9:31 PM  Between 7am to 6pm - Pager - [phone]  After 6pm go to [email or web address] - Proofreader  [facility]  Office  [phone]  CC: Primary care physician; Guadalupe Maple, MD

## 2018-11-19 NOTE — ED Notes (Signed)
Port accessed, upper right chest. Asepetic technique utilized. 20G, 1" access device used. Labs and blood cultures drawn, port flushed and saline locked. Pt tolerated procedure well.

## 2018-11-19 NOTE — ED Triage Notes (Signed)
Patient presents to the ED via EMS from home.  EMS states they were called out to the house for patient being more lethargic today than normal and more short of breath.  Per EMS patient was 90% on 3L and they placed patient on 4L and sats were 94%.  Patient is on oxygen at all times.  Patient has swelling to his left arm.  Patient's wife states patient has been on oxygen for the past 3-4 months around the clock.  Patient has lung cancer as well.  Patient last had chemo and radiation prior to Thanksgiving.  Patient has a MOST form.

## 2018-11-19 NOTE — ED Notes (Signed)
Date and time results received: 11/19/18 7:06 PM   Test: troponin Critical Value: 0.06  Name of Provider Notified: Dr Cherylann Banas

## 2018-11-20 ENCOUNTER — Other Ambulatory Visit: Payer: Self-pay

## 2018-11-20 ENCOUNTER — Ambulatory Visit: Payer: Medicare Other

## 2018-11-20 DIAGNOSIS — R4182 Altered mental status, unspecified: Secondary | ICD-10-CM

## 2018-11-20 DIAGNOSIS — N179 Acute kidney failure, unspecified: Secondary | ICD-10-CM

## 2018-11-20 DIAGNOSIS — Z515 Encounter for palliative care: Secondary | ICD-10-CM

## 2018-11-20 DIAGNOSIS — J810 Acute pulmonary edema: Secondary | ICD-10-CM

## 2018-11-20 DIAGNOSIS — Z66 Do not resuscitate: Secondary | ICD-10-CM

## 2018-11-20 DIAGNOSIS — J9621 Acute and chronic respiratory failure with hypoxia: Secondary | ICD-10-CM

## 2018-11-20 DIAGNOSIS — I509 Heart failure, unspecified: Secondary | ICD-10-CM

## 2018-11-20 DIAGNOSIS — Z7189 Other specified counseling: Secondary | ICD-10-CM

## 2018-11-20 LAB — COMPREHENSIVE METABOLIC PANEL
ALBUMIN: 1.9 g/dL — AB (ref 3.5–5.0)
ALT: 15 U/L (ref 0–44)
AST: 26 U/L (ref 15–41)
Alkaline Phosphatase: 74 U/L (ref 38–126)
Anion gap: 6 (ref 5–15)
BUN: 50 mg/dL — ABNORMAL HIGH (ref 8–23)
CO2: 39 mmol/L — ABNORMAL HIGH (ref 22–32)
Calcium: 8.1 mg/dL — ABNORMAL LOW (ref 8.9–10.3)
Chloride: 91 mmol/L — ABNORMAL LOW (ref 98–111)
Creatinine, Ser: 1.51 mg/dL — ABNORMAL HIGH (ref 0.61–1.24)
GFR calc Af Amer: 51 mL/min — ABNORMAL LOW (ref >60)
GFR calc non Af Amer: 44 mL/min — ABNORMAL LOW (ref >60)
GLUCOSE: 105 mg/dL — AB (ref 70–99)
Potassium: 3.6 mmol/L (ref 3.5–5.1)
SODIUM: 136 mmol/L (ref 135–145)
Total Bilirubin: 1.3 mg/dL — ABNORMAL HIGH (ref 0.3–1.2)
Total Protein: 4.9 g/dL — ABNORMAL LOW (ref 6.5–8.1)

## 2018-11-20 LAB — PHOSPHORUS: Phosphorus: 5.3 mg/dL — ABNORMAL HIGH (ref 2.5–4.6)

## 2018-11-20 LAB — CBC
HCT: 29.7 % — ABNORMAL LOW (ref 39.0–52.0)
Hemoglobin: 9.2 g/dL — ABNORMAL LOW (ref 13.0–17.0)
MCH: 31.1 pg (ref 26.0–34.0)
MCHC: 31 g/dL (ref 30.0–36.0)
MCV: 100.3 fL — ABNORMAL HIGH (ref 80.0–100.0)
Platelets: 291 10*3/uL (ref 150–400)
RBC: 2.96 MIL/uL — ABNORMAL LOW (ref 4.22–5.81)
RDW: 21.5 % — ABNORMAL HIGH (ref 11.5–15.5)
WBC: 12.3 10*3/uL — ABNORMAL HIGH (ref 4.0–10.5)
nRBC: 0.2 % (ref 0.0–0.2)

## 2018-11-20 LAB — TROPONIN I
Troponin I: 0.06 ng/mL (ref <0.03)
Troponin I: 0.06 ng/mL (ref <0.03)

## 2018-11-20 LAB — PROCALCITONIN: Procalcitonin: <0.1 ng/mL

## 2018-11-20 LAB — MAGNESIUM: Magnesium: 2 mg/dL (ref 1.7–2.4)

## 2018-11-20 LAB — PROTIME-INR
INR: 2.75
PROTHROMBIN TIME: 28.7 s — AB (ref 11.4–15.2)

## 2018-11-20 MED ORDER — MORPHINE SULFATE (CONCENTRATE) 10 MG/0.5ML PO SOLN
5.0000 mg | ORAL | Status: DC | PRN
  Administered 2018-11-20: 5 mg via ORAL
  Filled 2018-11-20: qty 1

## 2018-11-20 MED ORDER — IPRATROPIUM-ALBUTEROL 0.5-2.5 (3) MG/3ML IN SOLN
3.0000 mL | RESPIRATORY_TRACT | Status: DC
  Administered 2018-11-20 (×2): 3 mL via RESPIRATORY_TRACT
  Filled 2018-11-20 (×2): qty 3

## 2018-11-20 MED ORDER — BUDESONIDE 0.25 MG/2ML IN SUSP
0.2500 mg | Freq: Two times a day (BID) | RESPIRATORY_TRACT | Status: DC
  Administered 2018-11-20: 0.25 mg via RESPIRATORY_TRACT
  Filled 2018-11-20: qty 2

## 2018-11-20 MED ORDER — GLYCOPYRROLATE 0.2 MG/ML IJ SOLN
0.2000 mg | INTRAMUSCULAR | Status: DC | PRN
  Filled 2018-11-20: qty 1

## 2018-11-20 MED ORDER — DOCUSATE SODIUM 100 MG PO CAPS: 100.0000 mg | ORAL_CAPSULE | Freq: Two times a day (BID) | ORAL | Status: DC | PRN

## 2018-11-20 MED ORDER — FUROSEMIDE 10 MG/ML IJ SOLN
40.0000 mg | Freq: Once | INTRAMUSCULAR | Status: AC
  Administered 2018-11-20: 40 mg via INTRAVENOUS
  Filled 2018-11-20: qty 4

## 2018-11-20 MED ORDER — PHENYLEPHRINE HCL-NACL 10-0.9 MG/250ML-% IV SOLN
0.0000 ug/min | INTRAVENOUS | Status: DC
  Administered 2018-11-20: 30 ug/min via INTRAVENOUS
  Filled 2018-11-20: qty 250

## 2018-11-20 MED ORDER — BIOTENE DRY MOUTH MT LIQD: 15.0000 mL | OROMUCOSAL | Status: DC | PRN

## 2018-11-20 MED ORDER — MORPHINE SULFATE (PF) 2 MG/ML IV SOLN
2.0000 mg | INTRAVENOUS | Status: DC | PRN
  Administered 2018-11-23: 2 mg via INTRAVENOUS
  Filled 2018-11-20: qty 1

## 2018-11-20 MED ORDER — MORPHINE SULFATE (CONCENTRATE) 10 MG/0.5ML PO SOLN
5.0000 mg | ORAL | Status: DC | PRN
  Filled 2018-11-20: qty 1

## 2018-11-20 MED ORDER — ALPRAZOLAM 0.25 MG PO TABS
0.2500 mg | ORAL_TABLET | Freq: Three times a day (TID) | ORAL | Status: DC | PRN
  Filled 2018-11-20: qty 1

## 2018-11-20 MED ORDER — SODIUM CHLORIDE 0.9 % IV SOLN
2.0000 g | Freq: Two times a day (BID) | INTRAVENOUS | Status: DC
  Administered 2018-11-20: 2 g via INTRAVENOUS
  Filled 2018-11-20 (×2): qty 2

## 2018-11-20 MED ORDER — GLYCOPYRROLATE 1 MG PO TABS
1.0000 mg | ORAL_TABLET | ORAL | Status: DC | PRN
  Filled 2018-11-20: qty 1

## 2018-11-20 MED ORDER — DILTIAZEM HCL ER COATED BEADS 120 MG PO CP24
120.0000 mg | ORAL_CAPSULE | Freq: Every day | ORAL | Status: DC
  Administered 2018-11-20 – 2018-11-23 (×4): 120 mg via ORAL
  Filled 2018-11-20 (×5): qty 1

## 2018-11-20 MED ORDER — AMIODARONE IV BOLUS ONLY 150 MG/100ML
150.0000 mg | Freq: Once | INTRAVENOUS | Status: AC
  Administered 2018-11-20: 150 mg via INTRAVENOUS
  Filled 2018-11-20: qty 100

## 2018-11-20 MED ORDER — POLYVINYL ALCOHOL 1.4 % OP SOLN
1.0000 [drp] | Freq: Four times a day (QID) | OPHTHALMIC | Status: DC | PRN
  Filled 2018-11-20: qty 15

## 2018-11-20 MED ORDER — IPRATROPIUM-ALBUTEROL 0.5-2.5 (3) MG/3ML IN SOLN
3.0000 mL | Freq: Four times a day (QID) | RESPIRATORY_TRACT | Status: DC
  Administered 2018-11-20 – 2018-11-21 (×4): 3 mL via RESPIRATORY_TRACT
  Filled 2018-11-20 (×5): qty 3

## 2018-11-20 NOTE — Progress Notes (Signed)
ANTICOAGULATION CONSULT NOTE - Initial Consult  Pharmacy Consult for warfarin Indication: atrial fibrillation  Allergies  Allergen Reactions  . Prednisone Other (See Comments)    Reaction: "organs shutting down"    Patient Measurements: Height: 5\' 3"  (160 cm) Weight: 153 lb (69.4 kg) IBW/kg (Calculated) : 56.9  Vital Signs: Temp: 98.7 F (37.1 C) (12/23 0000) Temp Source: Oral (12/22 1757) BP: 96/61 (12/22 2230) Pulse Rate: 111 (12/23 0316)  Labs: Recent Labs    11/19/18 1822 11/19/18 1823 11/19/18 2344  HGB 9.2*  --   --   HCT 30.0*  --   --   PLT 331  --   --   APTT  --  50*  --   LABPROT  --  30.4*  --   INR  --  2.96  --   CREATININE 1.58*  --   --   TROPONINI 0.06*  --  0.06*    Estimated Creatinine Clearance: 34.3 mL/min (A) (by C-G formula based on SCr of 1.58 mg/dL (H)).   Medical History: Past Medical History:  Diagnosis Date  . AICD (automatic cardioverter/defibrillator) present   . Aortic valve regurgitation   . Atrial fibrillation ([place])   . Barrett's esophagus   . Cancer ([place])    sate III lung  . CHF (congestive heart failure) ([place])   . COPD (chronic obstructive pulmonary disease) ([place])   . Diverticulosis   . Dysrhythmia   . GERD (gastroesophageal reflux disease)   . Gout   . Headache    aura only  . Heart murmur   . Hematuria   . Hernia of abdominal cavity   . History of prosthetic heart valve   . Hyperlipidemia   . Hypertension   . Nodular prostate with urinary obstruction   . Presence of permanent cardiac pacemaker   . Renal insufficiency   . Substance abuse (HCC)    alcohol    Medications:  Scheduled:  . aspirin EC  81 mg Oral Daily  . budesonide (PULMICORT) nebulizer solution  0.25 mg Nebulization BID  . docusate sodium  100 mg Oral BID  . finasteride  5 mg Oral Daily  . fluticasone  2 spray Each Nare Daily  . furosemide  20 mg Intravenous Q12H  . furosemide  40 mg Intravenous Once  . guaiFENesin  600 mg Oral BID  .  ipratropium-albuterol  3 mL Nebulization Q4H  . magnesium oxide  800 mg Oral TID  . pantoprazole  40 mg Oral Daily  . sodium chloride flush  3 mL Intravenous Q12H    Assessment: Patient admitted for SOB w/ h/o afib is anticoagulated at home with warfarin. Patient's PTA warfarin 2 mg daily  12/22 INR 2.96 therapeutic, borderline supratherapeutic.  Goal of Therapy:  INR 2-3 Monitor platelets by anticoagulation protocol: Yes   Plan:  Patient's INR is therapeutic but borderline supratherapeutic. Considering patient is acutely ill will hold off on restarting patient's warfarin and will recheck another INR w/ am labs to ensure INR is not rising. If INRs are stable will place patient back on home dose and monitor via daily INRs.  Tobie Lords, PharmD, BCPS Clinical Pharmacist 11/20/2018

## 2018-11-20 NOTE — Progress Notes (Signed)
Nutrition Brief Note  Patient identified to be seen for Malnutrition Screening Tool (MST). Chart reviewed and patient was discussed on rounds this AM. Diet was liberalized to regular. Patient now transitioning to comfort care.   No nutrition interventions warranted at this time. Please consult RD as needed.   Willey Blade, MS, [place], LDN Office: [phone] Pager: [phone] After Hours/Weekend Pager: [phone]

## 2018-11-20 NOTE — Progress Notes (Signed)
Pharmacy Antibiotic Note  Aydin Hink is a 77 y.o. male admitted on 11/19/2018 with pneumonia.  Pharmacy has been consulted for cefepime dosing.  Plan: Will start cefepime 2g IV q12h  Height: 5\' 3"  (160 cm) Weight: 153 lb (69.4 kg) IBW/kg (Calculated) : 56.9  Temp (24hrs), Avg:98.5 F (36.9 C), Min:98.3 F (36.8 C), Max:98.7 F (37.1 C)  Recent Labs  Lab 11/19/18 1822 11/19/18 1823 11/19/18 2135  WBC 11.2*  --   --   CREATININE 1.58*  --   --   LATICACIDVEN  --  1.6 1.2    Estimated Creatinine Clearance: 34.3 mL/min (A) (by C-G formula based on SCr of 1.58 mg/dL (H)).    Allergies  Allergen Reactions  . Prednisone Other (See Comments)    Reaction: "organs shutting down"    Thank you for allowing pharmacy to be a part of this patient's care.  Tobie Lords, PharmD, BCPS Clinical Pharmacist 11/20/2018

## 2018-11-20 NOTE — Progress Notes (Signed)
Bereavement cart ordered for patient and family. Madlyn Frankel, RN

## 2018-11-20 NOTE — Progress Notes (Signed)
[facility] NAME: Kirk Jones    MR#:  [id number]  DATE OF BIRTH:  10-11-1941  SUBJECTIVE:  CHIEF COMPLAINT:   Chief Complaint  Patient presents with  . Shortness of Breath   Came with worsening shortness of breath from nursing home. Required BiPAP initially.  Not much communicative. REVIEW OF SYSTEMS:  Acuity of condition and mental status change, he is not able to give review of system.  ROS  DRUG ALLERGIES:   Allergies  Allergen Reactions  . Prednisone Other (See Comments)    Reaction: "organs shutting down"    VITALS:  Blood pressure 105/63, pulse (!) 130, temperature 97.7 F (36.5 C), temperature source Axillary, resp. rate (!) 21, height 5\' 3"  (1.6 m), weight 71.1 kg, SpO2 94 %.  PHYSICAL EXAMINATION:  GENERAL:  77 y.o.-year-old patient lying in the bed with no acute distress.  EYES: Pupils equal, round, reactive to light and accommodation. No scleral icterus. Extraocular muscles intact.  HEENT: Head atraumatic, normocephalic. Oropharynx and nasopharynx clear.  NECK:  Supple, no jugular venous distention. No thyroid enlargement, no tenderness.  LUNGS: Normal breath sounds bilaterally, no wheezing, some crepitation. No use of accessory muscles of respiration.  CARDIOVASCULAR: S1, S2 normal. No murmurs, rubs, or gallops.  ABDOMEN: Soft, nontender, nondistended. Bowel sounds present. No organomegaly or mass.  EXTREMITIES: Some pedal edema, cyanosis, or clubbing.  NEUROLOGIC: Patient is drowsy opens eyes, does not communicate much.   PSYCHIATRIC: The patient is drowsy.  SKIN: No obvious rash, lesion, or ulcer.   Physical Exam LABORATORY PANEL:   CBC Recent Labs  Lab 11/20/18 0524  WBC 12.3*  HGB 9.2*  HCT 29.7*  PLT 291   ------------------------------------------------------------------------------------------------------------------  Chemistries  Recent Labs  Lab 11/20/18 0524  NA 136  K 3.6  CL  91*  CO2 39*  GLUCOSE 105*  BUN 50*  CREATININE 1.51*  CALCIUM 8.1*  MG 2.0  AST 26  ALT 15  ALKPHOS 74  BILITOT 1.3*   ------------------------------------------------------------------------------------------------------------------  Cardiac Enzymes Recent Labs  Lab 11/19/18 2344 11/20/18 0524  TROPONINI 0.06* 0.06*   ------------------------------------------------------------------------------------------------------------------  RADIOLOGY:  Dg Chest Portable 1 View  Result Date: 11/19/2018 CLINICAL DATA:  Shortness of breath EXAM: PORTABLE CHEST 1 VIEW COMPARISON:  10/21/2018, CT 10/21/2018, radiograph 10/11/2018 FINDINGS: Left-sided pacing device as before. Post sternotomy changes. Right-sided central venous port tip over the SVC. Increased bilateral pleural effusions, right greater than left. Worsening airspace disease at both bases. Enlarged cardiomediastinal silhouette with increased vascular congestion and background edema. Aortic atherosclerosis. IMPRESSION: 1. Increasing bilateral right greater than left pleural effusions with worsening airspace disease at both bases. 2. Enlarged cardiomediastinal silhouette with increased vascular congestion and continued pulmonary edema. Electronically Signed   By: Donavan Foil M.D.   On: 11/19/2018 18:48    ASSESSMENT AND PLAN:   Active Problems:   CHF (congestive heart failure) (HCC)  *Acute hypoxic and hypercarbic respiratory failure *Acute on chronic systolic congestive heart failure *Elevated LFT *Acute kidney injury on chronic kidney disease stage III *Stage III lung cancer *History of atrial fibrillation  On my review of documents it seems patient was at the rehab center but the plan was to involve hospice.  I spoke to ICU physician and to the palliative care team.  They had done discussions about goals of care with the family and they agreed on comfort measures in the hospital.   All the records are reviewed and  case discussed with  Care Management/Social Workerr. Management plans discussed with the patient, family and they are in agreement.  CODE STATUS: DNR  TOTAL TIME TAKING CARE OF THIS PATIENT: 35 minutes.     POSSIBLE D/C IN 1 DAYS, DEPENDING ON CLINICAL CONDITION.   Vaughan Basta M.D on 11/20/2018   Between 7am to 6pm - Pager - [phone]  After 6pm go to [email or web address] - password [email or web address] [facility]  Office  [phone]  CC: Primary care physician; Guadalupe Maple, MD  Note: This dictation was prepared with Dragon dictation along with smaller phrase technology. Any transcriptional errors that result from this process are unintentional.

## 2018-11-20 NOTE — Progress Notes (Signed)
Report given to [facility], pt transferred to room 122 no distress noted. Pt Awake and alert while during transfer.

## 2018-11-20 NOTE — Consult Note (Signed)
PULMONARY / CRITICAL CARE MEDICINE  Name: Kirk Jones MRN: [id number] DOB: 1941-08-21    LOS: 1  Referring Provider: Dr. Sedalia Muta Reason for Referral: Acute hypercarbic respiratory failure Brief patient description: 77 year old male with stage III lung cancer admitted with acute hypoxic respiratory failure, bilateral pleural effusions, pulmonary edema and pneumonia.  Currently on BiPAP HPI: This is a 77 year old male with PMH of atrial fibrillation, aortic valve regurgitation, Barrett's esophagus, stage III lung cancer, chronicsystolic congestive heart failure, COPD, GERD, gout, hyperlipidemia, hypertension who presented to [facility] ED from [facility] where he is receiving rehab therapy with complaints of  worsening shortness of breath, increased lethargy and hypoxia.  He is on 4 L nasal cannula at baseline.  When EMS arrived, his SPO2 on his baseline oxygen was 90%.  He is currently undergoing chemo and radiation.  His last chemo and radiation sessions 20 prior to Thanksgiving.  Last chemo 10/19/2018.  At the ED, his chest x-ray showed bilateral pleural effusions with atelectasis and possible infiltrate as well as pulmonary vascular congestion consistent with acute CHF exacerbation.  He was placed on BiPAP and is being admitted to the ICU for further management.  His labs showed an elevated proBNP level of 3000, creatinine level of 1.58 up from his baseline of 0.85, VBG with a pH of 7.32 and PCO2 of 81, and a WBC of 11.2K.  He remains somnolent but arouses to voice and noxious stimulus.  His INR is 2.96 and PTT is 30.4.  Per his home medication list, he is on Coumadin 5 mg daily. Of note, patient was recently hospitalized in early December with hyponatremia, acute CHF exacerbation and hypotension.  Past Medical History:  Diagnosis Date  . AICD (automatic cardioverter/defibrillator) present   . Aortic valve regurgitation   . Atrial fibrillation ([place])   . Barrett's esophagus   .  Cancer ([place])    sate III lung  . CHF (congestive heart failure) ([place])   . COPD (chronic obstructive pulmonary disease) ([place])   . Diverticulosis   . Dysrhythmia   . GERD (gastroesophageal reflux disease)   . Gout   . Headache    aura only  . Heart murmur   . Hematuria   . Hernia of abdominal cavity   . History of prosthetic heart valve   . Hyperlipidemia   . Hypertension   . Nodular prostate with urinary obstruction   . Presence of permanent cardiac pacemaker   . Renal insufficiency   . Substance abuse ([place])    alcohol   Past Surgical History:  Procedure Laterality Date  . CARDIAC VALVE REPLACEMENT N/A 2004  . CARPAL TUNNEL RELEASE Right 2015  . CATARACT EXTRACTION Right 2017  . COLONOSCOPY WITH PROPOFOL N/A 2014  . CORONARY ANGIOPLASTY     stints x2  . EYE SURGERY     cross eyed  . FOOT SURGERY Bilateral    [place]  . FRACTURE SURGERY Right    [place]  . INGUINAL HERNIA REPAIR Right 09/16/2017   Procedure: HERNIA REPAIR INGUINAL ADULT;  Surgeon: Christene Lye, MD;  Location: [facility] ORS;  Service: General;  Laterality: Right;  . INSERT / REPLACE / [place]     ICD  . PACEMAKER IMPLANT Left 2013   and ICD  . PORTA CATH INSERTION N/A 09/21/2018   Procedure: PORTA CATH INSERTION;  Surgeon: Algernon Huxley, MD;  Location: [facility];  Service: Cardiovascular;  Laterality: N/A;  . VASECTOMY     Prior  to Admission medications   Medication Sig Start Date End Date Taking? Authorizing Provider  amLODipine (NORVASC) 5 MG tablet Take 5 mg by mouth daily.   Yes [provider]  clopidogrel (PLAVIX) 75 MG tablet Take 75 mg by mouth daily.   Yes [provider]  donepezil (ARICEPT) 5 MG tablet Take 1 tablet (5 mg total) by mouth at bedtime. 07/24/18 09/02/18 Yes Sowles, Drue Stager, MD  empagliflozin (JARDIANCE) 25 MG TABS tablet Take 25 mg by mouth daily.   Yes [provider]  glycopyrrolate (ROBINUL) 1 MG tablet Take 1 mg by mouth 2  (two) times daily.   Yes [provider]  insulin aspart (NOVOLOG FLEXPEN) 100 UNIT/ML FlexPen Inject 12 Units into the skin 2 (two) times daily.   Yes [provider]  insulin aspart (NOVOLOG) 100 UNIT/ML FlexPen Inject 18 Units into the skin daily. At 1700   Yes [provider]  Insulin Degludec-Liraglutide (XULTOPHY) 100-3.6 UNIT-MG/ML SOPN Inject 50 Units into the skin daily.   Yes [provider]  levETIRAcetam (KEPPRA) 500 MG tablet Take 500 mg by mouth 2 (two) times daily.   Yes [provider]  lipase/protease/amylase (CREON) 12000 units CPEP capsule Take 6,000 Units by mouth 3 (three) times daily before meals.   Yes [provider]  lipase/protease/amylase (CREON) 12000 units CPEP capsule Take 3,000 Units by mouth at bedtime. With snack   Yes [provider]  lisinopril (PRINIVIL,ZESTRIL) 5 MG tablet Take 5 mg by mouth daily.   Yes [provider]  metoprolol succinate (TOPROL-XL) 25 MG 24 hr tablet Take 1 tablet (25 mg total) by mouth daily. 07/24/18  Yes Sowles, Drue Stager, MD  rosuvastatin (CRESTOR) 40 MG tablet Take 1 tablet (40 mg total) by mouth daily. 07/24/18 09/02/18 Yes Steele Sizer, MD  aspirin EC 81 MG tablet Take 81 mg by mouth daily.    [provider]  famotidine (PEPCID) 20 MG tablet Take 1 tablet (20 mg total) by mouth 2 (two) times daily. 07/24/18 08/23/18  Steele Sizer, MD  gabapentin (NEURONTIN) 300 MG capsule Take 1 capsule (300 mg total) by mouth 2 (two) times daily. 07/24/18 08/23/18  Steele Sizer, MD  insulin glargine (LANTUS) 100 UNIT/ML injection Inject 0.1 mLs (10 Units total) into the skin daily. 07/24/18 08/23/18  Steele Sizer, MD  lacosamide 100 MG TABS Take 1 tablet (100 mg total) by mouth 2 (two) times daily. Patient not taking: Reported on 09/02/2018 12/09/17   Fritzi Mandes, MD  promethazine (PHENERGAN) 12.5 MG tablet Take 1 tablet (12.5 mg total) by mouth every 6 (six) hours as  needed for nausea or vomiting. Patient not taking: Reported on 09/02/2018 09/27/17   Stark Klein, MD  sertraline (ZOLOFT) 25 MG tablet Take 1 tablet (25 mg total) by mouth daily. Patient not taking: Reported on 09/02/2018 07/24/18   Steele Sizer, MD   Allergies Allergies  Allergen Reactions  . Prednisone Other (See Comments)    Reaction: "organs shutting down"    Family History Family History  Problem Relation Age of Onset  . Heart attack Mother   . Diabetes Maternal Grandmother   . Lung cancer Sister    Social History  reports that he quit smoking about 27 years ago. His smoking use included cigarettes. He has never used smokeless tobacco. He reports current alcohol use of about 2.0 standard drinks of alcohol per week. He reports that he does not use drugs.  Review Of Systems: Unable to obtain as patient is somnolent  and on continuous BiPAP  VITAL SIGNS: BP 96/61   Pulse 100   Temp 98.3 F (36.8 C) (Oral)   Resp (!) 26   Ht 5\' 3"  (1.6 m)   Wt 69.4 kg   SpO2 99%   BMI 27.10 kg/m   HEMODYNAMICS:    VENTILATOR SETTINGS: FiO2 (%):  [35 %] 35 %  INTAKE / OUTPUT: No intake/output data recorded.  PHYSICAL EXAMINATION: General: Acutely ill looking HEENT: Normocephalic and atraumatic, trachea midline, no JVD, PERRLA Neuro: Opens eyes to voice and noxious stimulus, moves all extremities, somnolent Cardiovascular: Apical pulse tachycardic, irregular, S1-S2, no murmur regurg or gallop, +2 pulses bilaterally, +3 pitting edema in bilateral lower extremities and left upper extremity Lungs: Breath sounds diminished in all lung fields, bibasilar crackles and rails Abdomen: Nondistended, normal bowel sounds in all 4 quadrants, palpation reveals no organomegaly Musculoskeletal: No joint deformities.  Positive range of motion in upper and lower extremities Skin: Warm and dry  LABS:  BMET Recent Labs  Lab 11/19/18 1822  NA 133*  K 3.8  CL 88*  CO2 37*  BUN 50*   CREATININE 1.58*  GLUCOSE 174*    Electrolytes Recent Labs  Lab 11/19/18 1822  CALCIUM 8.1*    CBC Recent Labs  Lab 11/19/18 1822  WBC 11.2*  HGB 9.2*  HCT 30.0*  PLT 331    Coag's Recent Labs  Lab 11/19/18 1823  APTT 50*  INR 2.96    Sepsis Markers Recent Labs  Lab 11/19/18 1823 11/19/18 2135  LATICACIDVEN 1.6 1.2    ABG No results for input(s): PHART, PCO2ART, PO2ART in the last 168 hours.  Liver Enzymes Recent Labs  Lab 11/19/18 1822  AST 28  ALT 17  ALKPHOS 81  BILITOT 1.1  ALBUMIN 2.1*    Cardiac Enzymes Recent Labs  Lab 11/19/18 1822  TROPONINI 0.06*    Glucose Recent Labs  Lab 11/19/18 2307  GLUCAP 115*    Imaging Dg Chest Portable 1 View  Result Date: 11/19/2018 CLINICAL DATA:  Shortness of breath EXAM: PORTABLE CHEST 1 VIEW COMPARISON:  10/21/2018, CT 10/21/2018, radiograph 10/11/2018 FINDINGS: Left-sided pacing device as before. Post sternotomy changes. Right-sided central venous port tip over the SVC. Increased bilateral pleural effusions, right greater than left. Worsening airspace disease at both bases. Enlarged cardiomediastinal silhouette with increased vascular congestion and background edema. Aortic atherosclerosis. IMPRESSION: 1. Increasing bilateral right greater than left pleural effusions with worsening airspace disease at both bases. 2. Enlarged cardiomediastinal silhouette with increased vascular congestion and continued pulmonary edema. Electronically Signed   By: Donavan Foil M.D.   On: 11/19/2018 18:48   STUDIES:  Last 2D echo was October 22, 2018 and showed a left ventricular ejection fraction of 20 to 25% with severe pulmonary hypertension  CULTURES: None  ANTIBIOTICS: Cefepime  SIGNIFICANT EVENTS: 11/19/2018: Admitted Right chest wall Mediport LINES/TUBES: Peripheral IVs  ASSESSMENT Acute respiratory failure with hypercarbia and hypoxia requiring BiPAP Bilateral pleural effusions Bilateral  pneumonia Stage III lung cancer currently undergoing chemo and radiation therapy Acute systolic CHF exacerbation Acute pulmonary edema Acute on  renal failure- baseline Creatinine 0.85; now 1.58 Chronic anemia-likely chronic disease and iron deficiency Septic shock Atrial fibrillation on Coumadin  PLAN Hemodynamic monitoring per ICU protocol Continues BiPAP and titrate to nasal cannula as tolerated IV diuresis Chest x-ray and ABG as needed If worsening pleural effusions, consider consulting IR for thoracentesis Oncology consult for stage III lung cancer Trend creatinine and avoid nephrotoxic drugs Monitor hemoglobin and  hematocrit and transfuse for hemoglobin less than 7 Nebulized bronchodilators and inhaled steroids Pressors to maintain mean arterial blood pressure greater than 65 Antibiotics as above Trend procalcitonin and adjust antibiotics Guaifenesin 600 mg twice daily for cough INR is currently therapeutic at 2.96.  Pharmacy to dose  Best Practice: Code Status: DNR Diet: N.p.o. GI prophylaxis: Protonix 40 mg daily VTE prophylaxis: Already on Coumadin  FAMILY  - Updates: No family at bedside.  Will update when available  [facility] ANP-BC Pulmonary and Critical Care Medicine [facility] Pager [phone] or [phone]  NB: This document was prepared using Dragon voice recognition software and may include unintentional dictation errors.    11/20/2018, 12:14 AM

## 2018-11-20 NOTE — Progress Notes (Signed)
Decreased to 3l

## 2018-11-20 NOTE — Consult Note (Signed)
Consultation Note Date: 11/20/2018   Patient Name: Kirk Jones  DOB: [birth year]  MRN: [id number]  Age / Sex: 77 y.o., male  PCP: Guadalupe Maple, MD Referring Physician: Vaughan Basta, *  Reason for Consultation: Establishing goals of care  HPI/Patient Profile: 77 y.o. male admitted on 11/19/2018 from [facility] (Rehab) with complaints of shortness of breath. He has a past medical history of atrial fibrillation, lung cancer stage III, CHF, COPD, GERD, AICD, hypertension, renal insufficiency, alcohol abuse, and hyperlipidemia. Patient presented to ED with worsening bilateral upper extremity edema and worsening shortness of breath x 2-3 days. He was recently hospitalized and discharged to SNF for rehabilitation. During his ED course chest x-ray suggestive of pulmonary edema. He continued to be hypoxic and placed on BiPAP for respiratory support.  Sodium 133 glucose 174, BUN 15, creatinine 1.58, albumin 2.1, troponin 0 0.06, BNP 3006. Since admission continues to have worsening shortness of breath.  Is able to be weaned from BiPAP on supplemental oxygen via nasal cannula. Palliative Medicine team consulted for goals of care discussion.   Clinical Assessment and Goals of Care: I have reviewed medical records including lab results, imaging, Epic notes, and MAR, received report from the bedside RN, and assessed the patient. I met at the bedside with patient, patient's wife Barnetta Chapel, and his 2 daughters Blinda Leatherwood and Sherilyn Dacosta to discuss diagnosis prognosis, GOC, EOL wishes, disposition and options.  Patient is awake, alert and oriented x3.  He was able to engage in goals of care discussion with his family and.  I introduced Palliative Medicine as specialized medical care for people living with serious illness. It focuses on providing relief from the symptoms and stress of a serious illness. The  goal is to improve quality of life for both the patient and the family.  Patient reports he has been married to his current wife for more than 18 years.  He has 2 daughters from a previous marriage.  He is a man of [place].  He enjoys spending time with family and friends.  As far as functional and nutritional status patient and family reports prior to admission his appetite continued to decrease.  He was recently discharged to rehabilitation unfortunately his health has continued to decline.  Patient and family reports they had recently spoken with palliative of their goal was for patient to be discharged from rehab later this week and home with hospice services.  Patient verbalizes he has terminal cancer which is not being treated and he knows his body is getting tired.  Family is tearful but verbalizes understanding and agreement with patient.  Patient requires assistance with ambulation as well as assistance with ADLs due to generalized weakness and increased fatigue.  He also complains of dyspnea with minimal exertion.  We discussed his current illness and what it means in the larger context of his on-going co-morbidities. We discussed specifically patients diagnosis of lung cancer, renal functioning, and patient's overall nutritional and functional state.  Natural disease trajectory and expectations at  EOL were discussed. Patient and family verbalized understanding and appreciation of conversation.   I attempted to elicit values and goals of care important to the patient. Daughters were tearful during conversation verbalizing "we know daddy is tired and he has told of this on several occassions this week. He is ready to go and tired of the back and forth and medical interventions!" Patient verbalized his agreement with their statements and expressed he was ready and felt that it would not be much longer.     The difference between aggressive medical intervention and comfort care was  considered in light of the patient's goals of care. I educated patient/family on what comfort care measures would look like. I discussed with them with comfort care, patient would no longer receive aggressive medical interventions such as continuous vital signs, lab work, radiology testing, or medications not focused on comfort. All care will focus on how he is looking and feeling. This will include management of any symptoms that may cause discomfort, pain, shortness of breath, cough, nausea, agitation, anxiety, and/or secretions etc. Symptoms will be managed with medications and other non-pharmacological interventions such as spiritual support if requested, repositioning, music therapy, or therapeutic listening. Family verbalized understanding and appreciation.   Kirk Jones expressed his goals were to be kept comfortable during his dying process. He joking stated "I have been around these women and many women, I think I have say so on this last part of my life." Support and comfort given. Daughters and wife was tearful yet laughing at his statement.   Patient and family confirmed wishes for DNR/DNI. Patient expressed he was not interested in remaining in ICU, wanted to be kept comfortable, and not have any further medical interventions (aggressive).  Patient has a documented advanced directive which list his daughters Blinda Leatherwood and Sherilyn Dacosta as his POA.   Hospice outpatient were explained and offered.  Given patient and family's expressed goals of care and wishes for end-of-life care, recommendations for hospice was given. I spent time discussing options of returning home with hospice versus residential hospice. Patient and family is requesting hospice home if available. They are aware if there is not a bed available patient would remain hospitalized. They are also aware if patient's condition is unstable for transport to the facility he may pass away in the hospital. They verbalized understanding.  Patient and family also  verbalized their understanding and awareness of the goals and philosophy of care of hospice.   Questions and concerns were addressed.  Hard Choices booklet left for review. The family was encouraged to call with questions or concerns.  PMT will continue to support holistically.  Primary Decision Maker: Patient is alert oriented x3.  He is capable of making medical decisions.  Daughters are his documented HCPOA patient is unable to make medical decisions PATIENT    SUMMARY OF RECOMMENDATIONS    DNR/DNI-as confirmed by patient and family  Comfort care measures as requested by patient and family. Goals are for end-of-life care with hopes of transferring to residential hospice home once bed is available. Patient/family aware if bed is not available patient will remain hospitalized and receive comfort measures in place.   CSW referral for residential hospice.   Morphine/Roxanol PRN for pain/shortness of breath  Xanax PRN for anxiety/agitation  Robinul PRN for excessive secretions  Continue with PRN nebulizer treatments for wheeze,cough, congestion  Zofran PRN for nausea   Oxygen for comfort only. No escalation.   Palliative Medicine team will continue to  support patient, patient's family, and medical team during hospitalization.   Code Status/Advance Care Planning:  DNR/DNI    Symptom Management:   Morphine/Roxanol PRN for pain/shortness of breath  Xanax PRN for anxiety/agitation  Robinul PRN for excessive secretions  Continue with PRN nebulizer treatments for wheeze,cough, congestion  Zofran PRN for nausea   Oxygen for comfort only. No escalation.   Palliative Prophylaxis:   Aspiration, Bowel Regimen, Delirium Protocol, Frequent Pain Assessment and Oral Care  Additional Recommendations (Limitations, Scope, Preferences):  Full Comfort Care  Psycho-social/Spiritual:   Desire for further Chaplaincy support:NO   Prognosis:   POOR- in the  setting of acute respiratory failure requiring supplemental oxygen, lung cancer stage III (no further oncology treatments due to overall functional/nutritional decline and cancer advancement), lethargy, worsening shortness of breath, hypoxia, CHF (end-stage),  GERD, worsening pleural effusion, acute on chronic renal failure, hypotension, decreased mobility, weight loss >10%, decreased po intake, malnutrition.   Discharge Planning: Hospice facility      Primary Diagnoses: Present on Admission: **None**   I have reviewed the medical record, interviewed the patient and family, and examined the patient. The following aspects are pertinent.  Past Medical History:  Diagnosis Date  . AICD (automatic cardioverter/defibrillator) present   . Aortic valve regurgitation   . Atrial fibrillation ([place])   . Barrett's esophagus   . Cancer ([place])    sate III lung  . CHF (congestive heart failure) ([place])   . COPD (chronic obstructive pulmonary disease) ([place])   . Diverticulosis   . Dysrhythmia   . GERD (gastroesophageal reflux disease)   . Gout   . Headache    aura only  . Heart murmur   . Hematuria   . Hernia of abdominal cavity   . History of prosthetic heart valve   . Hyperlipidemia   . Hypertension   . Nodular prostate with urinary obstruction   . Presence of permanent cardiac pacemaker   . Renal insufficiency   . Substance abuse ([place])    alcohol   Social History   Socioeconomic History  . Marital status: Married    Spouse name: Belenda Cruise   . Number of children: 5  . Years of education: Not on file  . Highest education level: Not on file  Occupational History  . Not on file  Social Needs  . Financial resource strain: Not hard at all  . Food insecurity:    Worry: Never true    Inability: Never true  . Transportation needs:    Medical: No    Non-medical: No  Tobacco Use  . Smoking status: Former Smoker    Types: Cigarettes    Last attempt to quit: 04/24/1991    Years since  quitting: 27.5  . Smokeless tobacco: Never Used  Substance and Sexual Activity  . Alcohol use: Yes    Alcohol/week: 2.0 standard drinks    Types: 2 Shots of liquor per week    Comment: quit December 2015/restarted-2 drinks per night  . Drug use: No  . Sexual activity: Never  Lifestyle  . Physical activity:    Days per week: 0 days    Minutes per session: 0 min  . Stress: Not at all  Relationships  . Social connections:    Talks on phone: Once a week    Gets together: Once a week    Attends religious service: More than 4 times per year    Active member of club or organization: No    Attends meetings of  clubs or organizations: Never    Relationship status: Married  Other Topics Concern  . Not on file  Social History Narrative  . Not on file   Family History  Problem Relation Age of Onset  . Heart attack Mother   . Diabetes Maternal Grandmother   . Lung cancer Sister    Scheduled Meds: . finasteride  5 mg Oral Daily  . ipratropium-albuterol  3 mL Nebulization Q6H  . sodium chloride flush  3 mL Intravenous Q12H   Continuous Infusions: PRN Meds:.ALPRAZolam, docusate sodium, morphine injection, [DISCONTINUED] ondansetron **OR** ondansetron (ZOFRAN) IV Medications Prior to Admission:  Prior to Admission medications   Medication Sig Start Date End Date Taking? Authorizing Provider  albuterol (PROVENTIL) (2.5 MG/3ML) 0.083% nebulizer solution Take 2.5 mg by nebulization every 3 (three) hours as needed for wheezing or shortness of breath.   Yes [provider]  ALPRAZolam (XANAX) 0.25 MG tablet Take 0.25 mg by mouth 2 (two) times daily as needed for anxiety.   Yes [provider]  aspirin EC 81 MG tablet Take 81 mg by mouth daily.   Yes [provider]  desonide (DESOWEN) 0.05 % cream Apply topically 2 (two) times daily. 03/30/18  Yes Volney American, PA-C  diltiazem (CARDIZEM SR) 60 MG 12 hr capsule Take 60 mg by mouth daily.   Yes [provider]  finasteride (PROSCAR) 5 MG tablet TAKE 1 TABLET BY MOUTH ONCE DAILY Patient taking differently: Take 5 mg by mouth daily.  07/12/18  Yes Volney American, PA-C  fluticasone [facility]) 50 MCG/ACT nasal spray Place 2 sprays daily into both nostrils. 10/14/17  Yes Crissman, Jeannette How, MD  furosemide (LASIX) 40 MG tablet Take 0.5 tablets (20 mg total) by mouth daily. Patient taking differently: Take 20 mg by mouth 2 (two) times daily.  10/30/18 11/29/18 Yes Pyreddy, Reatha Harps, MD  magnesium oxide (MAG-OX) 400 MG tablet Take 800 mg by mouth 3 (three) times daily.   Yes [provider]  metoprolol tartrate (LOPRESSOR) 50 MG tablet Take 1 tablet (50 mg total) by mouth 2 (two) times daily. 10/30/18 11/29/18 Yes Pyreddy, Reatha Harps, MD  Multiple Vitamins-Minerals (CENTRUM ADULTS PO) Take 1 tablet by mouth daily.    Yes [provider]  naproxen (NAPROSYN) 500 MG tablet Take 500 mg by mouth every 8 (eight) hours as needed for mild pain or moderate pain.   Yes [provider]  omeprazole (PRILOSEC) 20 MG capsule Take 1 capsule (20 mg total) by mouth daily. 10/19/17  Yes Crissman, Jeannette How, MD  Polyethyl Glycol-Propyl Glycol (SYSTANE OP) Place 1 drop into both eyes as needed (for dry eyes).   Yes [provider]  senna-docusate (SENOKOT-S) 8.6-50 MG tablet Take 1 tablet by mouth at bedtime as needed for mild constipation. 10/30/18  Yes Pyreddy, Reatha Harps, MD  tiotropium (SPIRIVA) 18 MCG inhalation capsule Place 1 capsule (18 mcg total) into inhaler and inhale daily. 10/19/17  Yes Crissman, Jeannette How, MD  vitamin C (ASCORBIC ACID) 250 MG tablet Take 500 mg by mouth 2 (two) times daily.   Yes [provider]  warfarin (COUMADIN) 2 MG tablet Take 2 mg by mouth every evening.   Yes [provider]  diltiazem (CARDIZEM CD) 120 MG 24 hr capsule Take 1 capsule (120 mg total) by mouth daily. Patient not taking: Reported on 11/19/2018 10/30/18 11/29/18  Saundra Shelling, MD    warfarin (COUMADIN) 3 MG tablet TAKE 1 TABLET BY MOUTH ONCE DAILY. Patient not taking:  No sig reported 06/21/18   Guadalupe Maple, MD   Allergies  Allergen Reactions  . Prednisone Other (See Comments)    Reaction: "organs shutting down"   Review of Systems  Constitutional: Positive for activity change, appetite change and fatigue.  Respiratory: Positive for cough and shortness of breath.   Neurological: Positive for weakness.    Physical Exam Vitals signs and nursing note reviewed.  Constitutional:      General: He is awake.     Appearance: He is cachectic. He is ill-appearing.     Comments: Thin, frail, chronically ill   Cardiovascular:     Rate and Rhythm: Tachycardia present. Rhythm regularly irregular.     Pulses: Normal pulses.     Heart sounds: Normal heart sounds.     Comments: A-fib  Pulmonary:     Breath sounds: Decreased breath sounds present.     Comments: Shortness of breath at times, 4L/[place]  Abdominal:     General: Bowel sounds are normal.  Skin:    General: Skin is warm and dry.     Findings: Bruising present.  Neurological:     Mental Status: He is alert and oriented to person, place, and time.  Psychiatric:        Attention and Perception: Attention normal.        Mood and Affect: Mood normal.        Speech: Speech normal.        Behavior: Behavior is cooperative.        Thought Content: Thought content normal.        Cognition and Memory: Cognition normal.        Judgment: Judgment normal.     Vital Signs: BP 100/69   Pulse (!) 111   Temp 97.7 F (36.5 C) (Axillary)   Resp 18   Ht 5' 3"  (1.6 m)   Wt 71.1 kg   SpO2 99%   BMI 27.77 kg/m  Pain Scale: 0-10   Pain Score: 0-No pain   SpO2: SpO2: 99 % O2 Device:SpO2: 99 % O2 Flow Rate: .O2 Flow Rate (L/min): 4 L/min  IO: Intake/output summary:   Intake/Output Summary (Last 24 hours) at 11/20/2018 1125 Last data filed at 11/19/2018 2130 Gross per 24 hour  Intake 250 ml  Output -  Net  250 ml    LBM:   Baseline Weight: Weight: 69.4 kg Most recent weight: Weight: 71.1 kg     Palliative Assessment/Data: PPS 20%   Time In: 1000 Time Out: 1130 Time Total: 90 min.   Greater than 50%  of this time was spent counseling and coordinating care related to the above assessment and plan.  Signed by:  Alda Lea, AGPCNP-BC Palliative Medicine Team  Phone: [phone] Fax: [phone] Pager: [phone] Amion: Bjorn Pippin    Please contact Palliative Medicine Team phone at [phone] for questions and concerns.  For individual provider: See Shea Evans

## 2018-11-21 ENCOUNTER — Ambulatory Visit: Payer: Medicare Other

## 2018-11-21 MED ORDER — IPRATROPIUM-ALBUTEROL 0.5-2.5 (3) MG/3ML IN SOLN
3.0000 mL | Freq: Three times a day (TID) | RESPIRATORY_TRACT | Status: DC
  Administered 2018-11-22 – 2018-11-23 (×5): 3 mL via RESPIRATORY_TRACT
  Filled 2018-11-21 (×5): qty 3

## 2018-11-21 NOTE — Care Management Important Message (Signed)
Copy of signed IM left with patient in room.  

## 2018-11-21 NOTE — Clinical Social Work Note (Addendum)
Clinical Social Work Assessment  Patient Details  Name: Kirk Jones MRN: [id number] Date of Birth: 06/19/1941  Date of referral:  11/21/18               Reason for consult:  End of Life/Hospice                Permission sought to share information with:  Facility Contact Representative Permission granted to share information::  Yes, Verbal Permission Granted  Name::      [facility]   Agency::     Relationship::     Contact Information:     Housing/Transportation Living arrangements for the past 2 months:  Skilled Nursing Facility, Single Family Home Source of Information:  Adult Children, Spouse Patient Interpreter Needed:  None Criminal Activity/Legal Involvement Pertinent to Current Situation/Hospitalization:  No - Comment as needed Significant Relationships:  Adult Children, Spouse Lives with:  Spouse Do you feel safe going back to the place where you live?    Need for family participation in patient care:  Yes (Comment)  Care giving concerns:  Patient came to [facility] from [facility] where he was at for short term rehab.    Social Worker assessment / plan:  Clinical Social Worker (CSW) received a hospice facility placement referral. CSW met with patient and his wife Kirk Jones and daughter Kirk Jones [phone] were at bedside. Patient did not participate in assessment. CSW introduced self and explained role of CSW department. CSW explained hospice facility process and provided hospice choice. Wife and daughter chose [facility]. Per wife patient's daughter Kirk Jones will sign paper work hospice. CSW sent referral to [facility] today. CSW will continue to follow and assist as needed.   Per Josh palliative care NP patient is appropriate for hospice facility placement and does meet the 2 weeks or less criteria. Per [facility] liaison they are on a waiting list at the hospice facility. CSW made patient's daughter Kirk Jones aware of the waiting list.    Employment status:  Disabled (Comment on whether or not currently receiving Disability) Insurance information:  Medicare PT Recommendations:  Not assessed at this time Information / Referral to community resources:  Other (Comment Required)(Hospice Facility placement )  Patient/Family's Response to care:  Patient's wife and daughter are agreeable to comfort care.   Patient/Family's Understanding of and Emotional Response to Diagnosis, Current Treatment, and Prognosis:  Patient's wife and daughter were very pleasant and thanked CSW for assistance.   Emotional Assessment Appearance:  Appears stated age Attitude/Demeanor/Rapport:  Unable to Assess Affect (typically observed):  Unable to Assess Orientation:  Oriented to Self Alcohol / Substance use:  Not Applicable Psych involvement (Current and /or in the community):  No (Comment)  Discharge Needs  Concerns to be addressed:  Discharge Planning Concerns Readmission within the last 30 days:  No Current discharge risk:  Chronically ill Barriers to Discharge:  Continued Medical Work up   Sample, Bailey M, LCSW 11/21/2018, 3:32 PM  

## 2018-11-21 NOTE — Progress Notes (Addendum)
[place]  Telephone:([phone] [phone]   Name: Kirk Jones [facility] Date: 11/21/2018 MRN: [id number]  DOB: [birth year]  Patient Care Team: Guadalupe Maple, MD as PCP - General (Family Medicine) Murlean Iba, MD (Internal Medicine) Isaias Cowman, MD as Consulting Physician (Cardiology) Christene Lye, MD (General Surgery) Telford Nab, RN as Registered Nurse    REASON FOR CONSULTATION: Palliative Care consult requested for this 77 y.o. male with multiple medical problems including undergoing concurrent chemoradiation treated with carbotaxol, severe aortic valve disease status post valve replacement 2004, CAD status post PCI, ischemic cardiomyopathy status post AICD insertion in 2012 with history of CHF.  PAF on warfarin, history of AAA, and CKD.  Patient was hospitalized on 10/11/2018 to 10/15/18 with syncope with sustained laceration to his head requiring sutures.  Workup revealed SVT and patient was started on amiodarone. He was hospitalized again 10/20/18 to 10/30/18 with shortness of breath and weakness. Workup revealed acute liver failure likely from congestive hepatopathy and acute on chronic kidney disease. Patient was discharged to [facility] where he has been followed by palliative care. Plan was for him to discharge home with hospice. However, patient was readmitted on 11/19/18 with acute on chronic hypoxic respiratory failure initially requiring BIPAP. Palliative care was again consulted to help address goals.   SOCIAL HISTORY:    Patient is married lives at home with his wife.  He has 2 adult daughters from a previous marriage.  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  Patient would also want daughters to be involved in decision-making.  Patient has a  living will.  CODE STATUS: DNR  PAST MEDICAL HISTORY: Past Medical History:  Diagnosis Date  . AICD (automatic cardioverter/defibrillator) present   . Aortic valve regurgitation   . Atrial fibrillation ([place])   . Barrett's esophagus   . Cancer ([place])    sate III lung  . CHF (congestive heart failure) ([place])   . COPD (chronic obstructive pulmonary disease) ([place])   . Diverticulosis   . Dysrhythmia   . GERD (gastroesophageal reflux disease)   . Gout   . Headache    aura only  . Heart murmur   . Hematuria   . Hernia of abdominal cavity   . History of prosthetic heart valve   . Hyperlipidemia   . Hypertension   . Nodular prostate with urinary obstruction   . Presence of permanent cardiac pacemaker   . Renal insufficiency   . Substance abuse ([place])    alcohol    PAST SURGICAL HISTORY:  Past Surgical History:  Procedure Laterality Date  . CARDIAC VALVE REPLACEMENT N/A 2004  . CARPAL TUNNEL RELEASE Right 2015  . CATARACT EXTRACTION Right 2017  . COLONOSCOPY WITH PROPOFOL N/A 2014  . CORONARY ANGIOPLASTY     stints x2  . EYE SURGERY     cross eyed  . FOOT SURGERY Bilateral    [place]  . FRACTURE SURGERY Right    femur  . INGUINAL HERNIA REPAIR Right 09/16/2017   Procedure: HERNIA REPAIR INGUINAL ADULT;  Surgeon: Christene Lye, MD;  Location: [facility] ORS;  Service: General;  Laterality: Right;  . INSERT / REPLACE / [place]     ICD  . PACEMAKER IMPLANT Left 2013   and ICD  . PORTA CATH INSERTION N/A 09/21/2018  Procedure: PORTA CATH INSERTION;  Surgeon: Algernon Huxley, MD;  Location: [facility];  Service: Cardiovascular;  Laterality: N/A;  . VASECTOMY      HEMATOLOGY/ONCOLOGY HISTORY:    Squamous cell carcinoma of right lung ([place])   09/14/2018 Cancer Staging    Staging form: Lung, AJCC 8th Edition - Clinical stage from 09/14/2018: Stage IIIA (cT3, cN1, cM0) - Signed by Sindy Guadeloupe, MD on 09/16/2018    09/16/2018 Initial Diagnosis     Squamous cell carcinoma of right lung ([place])    09/27/2018 -  Chemotherapy    The patient had palonosetron (ALOXI) injection 0.25 mg, 0.25 mg, Intravenous,  Once, 3 of 4 cycles Administration: 0.25 mg (09/28/2018), 0.25 mg (10/05/2018), 0.25 mg (10/19/2018) CARBOplatin (PARAPLATIN) 180 mg in sodium chloride 0.9 % 250 mL chemo infusion, 180 mg (100 % of original dose 182.6 mg), Intravenous,  Once, 3 of 4 cycles Dose modification:   (original dose 182.6 mg, Cycle 1) Administration: 180 mg (09/28/2018), 160 mg (10/05/2018), 110 mg (10/19/2018) PACLitaxel (TAXOL) 84 mg in sodium chloride 0.9 % 250 mL chemo infusion (</= 80mg /m2), 45 mg/m2 = 84 mg, Intravenous,  Once, 3 of 4 cycles Administration: 84 mg (09/28/2018), 84 mg (10/05/2018), 84 mg (10/19/2018)  for chemotherapy treatment.      ALLERGIES:  is allergic to prednisone.  MEDICATIONS:  Current Facility-Administered Medications  Medication Dose Route Frequency Provider Last Rate Last Dose  . ALPRAZolam (XANAX) tablet 0.25 mg  0.25 mg Oral TID PRN Pickenpack-Cousar, Athena N, NP      . antiseptic oral rinse (BIOTENE) solution 15 mL  15 mL Topical PRN Pickenpack-Cousar, Athena N, NP      . diltiazem (CARDIZEM CD) 24 hr capsule 120 mg  120 mg Oral Daily Wilhelmina Mcardle, MD   120 mg at 11/20/18 1345  . docusate sodium (COLACE) capsule 100 mg  100 mg Oral BID PRN Wilhelmina Mcardle, MD      . finasteride (PROSCAR) tablet 5 mg  5 mg Oral Daily Sedalia Muta, MD   5 mg at 11/20/18 1100  . glycopyrrolate (ROBINUL) tablet 1 mg  1 mg Oral Q4H PRN Pickenpack-Cousar, Athena N, NP       Or  . glycopyrrolate (ROBINUL) injection 0.2 mg  0.2 mg Intravenous Q4H PRN Pickenpack-Cousar, Athena N, NP      . ipratropium-albuterol (DUONEB) 0.5-2.5 (3) MG/3ML nebulizer solution 3 mL  3 mL Nebulization Q6H Wilhelmina Mcardle, MD   3 mL at 11/21/18 0837  . morphine 2 MG/ML injection 2-4 mg  2-4 mg Intravenous Q1H PRN Wilhelmina Mcardle, MD      . morphine CONCENTRATE 10  MG/0.5ML oral solution 5 mg  5 mg Oral Q2H PRN Pickenpack-Cousar, Carlena Sax, NP   5 mg at 11/20/18 1924   Or  . morphine CONCENTRATE 10 MG/0.5ML oral solution 5 mg  5 mg Sublingual Q2H PRN Pickenpack-Cousar, Athena N, NP      . ondansetron (ZOFRAN) injection 4 mg  4 mg Intravenous Q6H PRN Sedalia Muta, MD      . polyvinyl alcohol (LIQUIFILM TEARS) 1.4 % ophthalmic solution 1 drop  1 drop Both Eyes QID PRN Pickenpack-Cousar, Athena N, NP      . sodium chloride flush (NS) 0.9 % injection 3 mL  3 mL Intravenous Q12H Sedalia Muta, MD   3 mL at 11/20/18 2124   Facility-Administered Medications Ordered in Other Encounters  Medication Dose Route Frequency Provider Last Rate Last Dose  .  0.9 %  sodium chloride infusion   Intravenous Continuous Bruning, Lennette Bihari, PA-C        VITAL SIGNS: BP 105/63   Pulse (!) 130   Temp 97.7 F (36.5 C) (Axillary)   Resp (!) 21   Ht 5\' 3"  (1.6 m)   Wt 156 lb 12 oz (71.1 kg)   SpO2 94%   BMI 27.77 kg/m  Filed Weights   11/19/18 1802 11/19/18 2230 11/20/18 0500  Weight: 153 lb (69.4 kg) 156 lb 12 oz (71.1 kg) 156 lb 12 oz (71.1 kg)    Estimated body mass index is 27.77 kg/m as calculated from the following:   Height as of this encounter: 5\' 3"  (1.6 m).   Weight as of this encounter: 156 lb 12 oz (71.1 kg).  LABS: CBC:    Component Value Date/Time   WBC 12.3 (H) 11/20/2018 0524   HGB 9.2 (L) 11/20/2018 0524   HGB 13.6 11/10/2017 1328   HCT 29.7 (L) 11/20/2018 0524   HCT 40.8 11/10/2017 1328   PLT 291 11/20/2018 0524   PLT 255 11/10/2017 1328   MCV 100.3 (H) 11/20/2018 0524   MCV 103 (H) 11/10/2017 1328   MCV 106 (H) 11/20/2014 0549   NEUTROABS 9.4 (H) 11/19/2018 1822   NEUTROABS 5.1 11/10/2017 1328   NEUTROABS 4.7 11/20/2014 0549   LYMPHSABS 0.7 11/19/2018 1822   LYMPHSABS 0.8 11/10/2017 1328   LYMPHSABS 1.0 11/20/2014 0549   MONOABS 1.0 11/19/2018 1822   MONOABS 1.0 11/20/2014 0549   EOSABS 0.0 11/19/2018 1822   EOSABS 0.1 11/10/2017  1328   EOSABS 0.2 11/20/2014 0549   BASOSABS 0.0 11/19/2018 1822   BASOSABS 0.1 11/10/2017 1328   BASOSABS 0.1 11/20/2014 0549   Comprehensive Metabolic Panel:    Component Value Date/Time   NA 136 11/20/2018 0524   NA 136 05/08/2018 1507   NA 140 11/18/2014 0514   K 3.6 11/20/2018 0524   K 4.2 11/18/2014 0514   CL 91 (L) 11/20/2018 0524   CL 106 11/18/2014 0514   CO2 39 (H) 11/20/2018 0524   CO2 27 11/18/2014 0514   BUN 50 (H) 11/20/2018 0524   BUN 21 05/08/2018 1507   BUN 17 11/18/2014 0514   CREATININE 1.51 (H) 11/20/2018 0524   CREATININE 1.35 (H) 11/18/2014 0514   GLUCOSE 105 (H) 11/20/2018 0524   GLUCOSE 93 11/18/2014 0514   CALCIUM 8.1 (L) 11/20/2018 0524   CALCIUM 8.5 11/18/2014 0514   AST 26 11/20/2018 0524   AST 40 (H) 05/08/2018 1122   AST 68 (H) 11/16/2014 0031   ALT 15 11/20/2018 0524   ALT 23 05/08/2018 1122   ALT 102 (H) 11/16/2014 0031   ALKPHOS 74 11/20/2018 0524   ALKPHOS 614 (H) 11/16/2014 0031   BILITOT 1.3 (H) 11/20/2018 0524   BILITOT 0.5 10/24/2017 0910   BILITOT 0.6 11/16/2014 0031   PROT 4.9 (L) 11/20/2018 0524   PROT 6.7 10/24/2017 0910   PROT 7.2 11/16/2014 0031   ALBUMIN 1.9 (L) 11/20/2018 0524   ALBUMIN 4.4 10/24/2017 0910   ALBUMIN 3.1 (L) 11/16/2014 0031    RADIOGRAPHIC STUDIES: Dg Chest Portable 1 View  Result Date: 11/19/2018 CLINICAL DATA:  Shortness of breath EXAM: PORTABLE CHEST 1 VIEW COMPARISON:  10/21/2018, CT 10/21/2018, radiograph 10/11/2018 FINDINGS: Left-sided pacing device as before. Post sternotomy changes. Right-sided central venous port tip over the SVC. Increased bilateral pleural effusions, right greater than left. Worsening airspace disease at both bases. Enlarged cardiomediastinal silhouette with increased  vascular congestion and background edema. Aortic atherosclerosis. IMPRESSION: 1. Increasing bilateral right greater than left pleural effusions with worsening airspace disease at both bases. 2. Enlarged  cardiomediastinal silhouette with increased vascular congestion and continued pulmonary edema. Electronically Signed   By: Donavan Foil M.D.   On: 11/19/2018 18:48    PERFORMANCE STATUS (ECOG) : 4 - Bedbound  Review of Systems As noted above. Otherwise, a complete review of systems is negative.  Physical Exam General: NAD, frail appearing, thin Cardiovascular: regular rate and rhythm Pulmonary: poor air movement without wheeze, on O2 Abdomen: soft, nontender, + bowel sounds Extremities: no edema Skin: no rashes Neurological: Weakness but otherwise nonfocal  IMPRESSION: Patient was started on comfort care yesterday. Reportedly, plan is for discharge eventual discharge home with hospice. No family currently available. Will consult CM to help with dispo.   Patient has no acute complaints or concerns other than dry nares. Will humidify O2. Has morphine ordered prn for dyspnea but has not required recent dosing.   I tried calling patient's daughter but did not reach her.   Case discussed with attending.   PLAN: Supportive care Home with hospice when medically ready   Time Total: 20 minutes  Visit consisted of counseling and education dealing with the complex and emotionally intense issues of symptom management and palliative care in the setting of serious and potentially life-threatening illness.Greater than 50%  of this time was spent counseling and coordinating care related to the above assessment and plan.  Signed by: Altha Harm, PhD, NP-C [phone] (Work Cell)

## 2018-11-21 NOTE — Progress Notes (Signed)
[place] at [place] NAME: Kirk Jones    MR#:  [id number]  DATE OF BIRTH:  10/09/1941  SUBJECTIVE:  CHIEF COMPLAINT:   Chief Complaint  Patient presents with  . Shortness of Breath   Came with worsening shortness of breath from nursing home. Required BiPAP initially.  Started on comfort care and transferred to floor.  Remains on supplemental oxygen but more comfortable and alert today no complaints. REVIEW OF SYSTEMS:  Acuity of condition and mental status change, he is not able to give review of system.  ROS  DRUG ALLERGIES:   Allergies  Allergen Reactions  . Prednisone Other (See Comments)    Reaction: "organs shutting down"    VITALS:  Blood pressure 105/63, pulse (!) 130, temperature 97.7 F (36.5 C), temperature source Axillary, resp. rate (!) 21, height 5\' 3"  (1.6 m), weight 71.1 kg, SpO2 94 %.  PHYSICAL EXAMINATION:  GENERAL:  77 y.o.-year-old patient lying in the bed with no acute distress.  EYES: Pupils equal, round, reactive to light and accommodation. No scleral icterus. Extraocular muscles intact.  HEENT: Head atraumatic, normocephalic. Oropharynx and nasopharynx clear.  NECK:  Supple, no jugular venous distention. No thyroid enlargement, no tenderness.  LUNGS: Normal breath sounds bilaterally, no wheezing, some crepitation. No use of accessory muscles of respiration.  On nasal cannula oxygen. CARDIOVASCULAR: S1, S2 normal. No murmurs, rubs, or gallops.  ABDOMEN: Soft, nontender, nondistended. Bowel sounds present. No organomegaly or mass.  EXTREMITIES: Some pedal edema, cyanosis, or clubbing.  NEUROLOGIC: Patient is alert, not communicate much.  Moves limbs slowly. PSYCHIATRIC: The patient is alert.  SKIN: No obvious rash, lesion, or ulcer.   Physical Exam LABORATORY PANEL:   CBC Recent Labs  Lab 11/20/18 0524  WBC 12.3*  HGB 9.2*  HCT 29.7*  PLT 291    ------------------------------------------------------------------------------------------------------------------  Chemistries  Recent Labs  Lab 11/20/18 0524  NA 136  K 3.6  CL 91*  CO2 39*  GLUCOSE 105*  BUN 50*  CREATININE 1.51*  CALCIUM 8.1*  MG 2.0  AST 26  ALT 15  ALKPHOS 74  BILITOT 1.3*   ------------------------------------------------------------------------------------------------------------------  Cardiac Enzymes Recent Labs  Lab 11/19/18 2344 11/20/18 0524  TROPONINI 0.06* 0.06*   ------------------------------------------------------------------------------------------------------------------  RADIOLOGY:  Dg Chest Portable 1 View  Result Date: 11/19/2018 CLINICAL DATA:  Shortness of breath EXAM: PORTABLE CHEST 1 VIEW COMPARISON:  10/21/2018, CT 10/21/2018, radiograph 10/11/2018 FINDINGS: Left-sided pacing device as before. Post sternotomy changes. Right-sided central venous port tip over the SVC. Increased bilateral pleural effusions, right greater than left. Worsening airspace disease at both bases. Enlarged cardiomediastinal silhouette with increased vascular congestion and background edema. Aortic atherosclerosis. IMPRESSION: 1. Increasing bilateral right greater than left pleural effusions with worsening airspace disease at both bases. 2. Enlarged cardiomediastinal silhouette with increased vascular congestion and continued pulmonary edema. Electronically Signed   By: Donavan Foil M.D.   On: 11/19/2018 18:48    ASSESSMENT AND PLAN:   Active Problems:   CHF (congestive heart failure) (HCC)  *Acute hypoxic and hypercarbic respiratory failure *Acute on chronic systolic congestive heart failure *Elevated LFT *Acute kidney injury on chronic kidney disease stage III *Stage III lung cancer *History of atrial fibrillation  Family had agreed on comfort care so we will continue comfort meds as needed in the hospital.  He is currently awaited to go to  hospice home once the bed is available.   All the records are reviewed and case discussed with  Care Management/Social Workerr. Management plans discussed with the patient, family and they are in agreement.  CODE STATUS: DNR  TOTAL TIME TAKING CARE OF THIS PATIENT: 35 minutes.    POSSIBLE D/C IN 1 DAYS, DEPENDING ON CLINICAL CONDITION.   Vaughan Basta M.D on 11/21/2018   Between 7am to 6pm - Pager - [phone]  After 6pm go to [email or web address] - password [email or web address] [facility]  Office  [phone]  CC: Primary care physician; Guadalupe Maple, MD  Note: This dictation was prepared with Dragon dictation along with smaller phrase technology. Any transcriptional errors that result from this process are unintentional.

## 2018-11-22 NOTE — Progress Notes (Signed)
Per [facility] liaison there are no hospice beds today. Patient continues to be on the waiting list. Clinical Social Worker ([place]) will continue to follow and assist as needed.   [employer], LCSW [phone]

## 2018-11-22 NOTE — Progress Notes (Signed)
[place] at [place] NAME: Kirk Jones    MR#:  [id number]  DATE OF BIRTH:  [birth year]  SUBJECTIVE:  CHIEF COMPLAINT:   Chief Complaint  Patient presents with  . Shortness of Breath   Came with worsening shortness of breath from nursing home. Required BiPAP initially.  Started on comfort care and transferred to floor.  Remains on supplemental oxygen but more comfortable and alert today no complaints.  REVIEW OF SYSTEMS:   Acuity of condition and mental status change, he is not able to give review of system.  ROS  DRUG ALLERGIES:   Allergies  Allergen Reactions  . Prednisone Other (See Comments)    Reaction: "organs shutting down"    VITALS:  Blood pressure 126/67, pulse (!) 123, temperature (!) 97.5 F (36.4 C), temperature source Oral, resp. rate (!) 21, height 5\' 3"  (1.6 m), weight 71.1 kg, SpO2 90 %.  PHYSICAL EXAMINATION:  GENERAL:  77 y.o.-year-old patient lying in the bed with no acute distress.  EYES: Pupils equal, round, reactive to light and accommodation. No scleral icterus. Extraocular muscles intact.  HEENT: Head atraumatic, normocephalic. Oropharynx and nasopharynx clear.  NECK:  Supple, no jugular venous distention. No thyroid enlargement, no tenderness.  LUNGS: Normal breath sounds bilaterally, no wheezing, some crepitation. No use of accessory muscles of respiration.  On nasal cannula oxygen. CARDIOVASCULAR: S1, S2 normal. No murmurs, rubs, or gallops.  ABDOMEN: Soft, nontender, nondistended. Bowel sounds present. No organomegaly or mass.  EXTREMITIES: Some pedal edema, cyanosis, or clubbing.  Left forearm swollen and redness. NEUROLOGIC: Patient is alert, not communicate much.  Moves limbs slowly. PSYCHIATRIC: The patient is alert.  SKIN: No obvious rash, lesion, or ulcer.   Physical Exam LABORATORY PANEL:   CBC Recent Labs  Lab 11/20/18 0524  WBC 12.3*  HGB 9.2*  HCT 29.7*  PLT 291    ------------------------------------------------------------------------------------------------------------------  Chemistries  Recent Labs  Lab 11/20/18 0524  NA 136  K 3.6  CL 91*  CO2 39*  GLUCOSE 105*  BUN 50*  CREATININE 1.51*  CALCIUM 8.1*  MG 2.0  AST 26  ALT 15  ALKPHOS 74  BILITOT 1.3*   ------------------------------------------------------------------------------------------------------------------  Cardiac Enzymes Recent Labs  Lab 11/19/18 2344 11/20/18 0524  TROPONINI 0.06* 0.06*   ------------------------------------------------------------------------------------------------------------------  RADIOLOGY:  No results found.  ASSESSMENT AND PLAN:   Active Problems:   CHF (congestive heart failure) (HCC)  *Acute hypoxic and hypercarbic respiratory failure *Acute on chronic systolic congestive heart failure *Elevated LFT *Acute kidney injury on chronic kidney disease stage III *Stage III lung cancer *History of atrial fibrillation  Family had agreed on comfort care so we will continue comfort meds as needed in the hospital.  He is currently awaited to go to hospice home once the bed is available.   All the records are reviewed and case discussed with Care Management/Social Workerr. Management plans discussed with the patient, family and they are in agreement.  CODE STATUS: DNR  TOTAL TIME TAKING CARE OF THIS PATIENT: 35 minutes.    POSSIBLE D/C IN 1 DAYS, DEPENDING ON CLINICAL CONDITION.   Vaughan Basta M.D on 11/22/2018   Between 7am to 6pm - Pager - [phone]  After 6pm go to [email or web address] - password [email or web address] [place] [facility]  Office  [phone]  CC: Primary care physician; Guadalupe Maple, MD  Note: This dictation was prepared with Dragon dictation along with smaller phrase technology. Any transcriptional errors that result from  this process are unintentional.

## 2018-11-23 MED ORDER — MORPHINE SULFATE (CONCENTRATE) 10 MG/0.5ML PO SOLN: 5.0000 mg | ORAL | 0 refills | Status: AC | PRN

## 2018-11-23 MED ORDER — GLYCOPYRROLATE 1 MG PO TABS: 1.0000 mg | ORAL_TABLET | ORAL | 0 refills | Status: AC | PRN

## 2018-11-23 MED ORDER — IPRATROPIUM-ALBUTEROL 0.5-2.5 (3) MG/3ML IN SOLN: 3.0000 mL | Freq: Three times a day (TID) | RESPIRATORY_TRACT | 0 refills | Status: AC

## 2018-11-23 NOTE — Progress Notes (Signed)
Follow up new hospice home referral received on 12/24. Patient is a 77 year old man with a PMH Stage III squamous cell carcinoma s/p chemo/radiation last treatment 11/21, of severe aortic valve disease status post valve replacement 2004, CAD status post PCI, ischemic cardiomyopathy status post AICD insertion in 2012 with history of CHF.  PAF on warfarin, history of AAA, and CKD.  Patient was hospitalized on 10/11/2018 to 10/15/18 with syncope with sustained laceration to his head requiring sutures.  Workup revealed SVT and patient was started on amiodarone. He was hospitalized again 11/22 to 12/2 with shortness of breath and weakness. Workup revealed acute liver failure.  He was discharged to [facility] where he has been followed by palliative care. Patient was readmitted on 12/22 with acute on chronic hypoxic respiratory failure initially requiring BIPAP. Palliative medicine was consulted for goals of care and have met with patient and his family over the course of the last few days. Patient and family have chosen to focus on comfort with transfer to the Hospice home. °Writer met in the room with patient, his wife and his daughter Deanna to initiate education regarding hospice services, philosophy and team approach to care with understanding voiced. Questions answered consents signed. Patient currently has an active AICD in place. Writer discussed deactivation with patient, his wife and daughter Deanna, all in agreement for writer to contact [employer] representative for this. Patient was alert and oriented and fully participated in the discussion, he did become short of breath with conversation and any movement in the bed. He was sitting up on the side of the bed at the beginning of the visit and became tired and needed assistance to bring his legs into the bed. He has received both IV morphine and liquid morphine in the past 2 days for both pain and dyspnea. °Writer spoke via telephone to [employer] rep  Marcella ([phone]), she is currently in clinic and will attempt to arrive prior to EMS arrival, if not she will come to the hospice home and deactivate the AICD there.   Hospital care team updated, patient information faxed to referral, family all aware of and in agreement with discharge plan.Pateint information and discharge summary faxed to referral, report called to the Hospice home. EMS notified for 5 pm pick up. Thank you. °Karen Robertson RN, BSN,CHPN °[facility] [place], hospital liaison °[phone] °

## 2018-11-23 NOTE — Discharge Summary (Addendum)
[place] [facility] [place] NAME: Kirk Jones    MR#:  [id number]  DATE OF BIRTH:  03-Jul-1941  DATE OF ADMISSION:  11/19/2018   ADMITTING PHYSICIAN: Sedalia Muta, MD  DATE OF DISCHARGE: 11/23/18  PRIMARY CARE PHYSICIAN: Guadalupe Maple, MD   ADMISSION DIAGNOSIS:  Acute kidney injury ([place]) [N17.9] Altered mental status, unspecified altered mental status type [R41.82] Acute on chronic congestive heart failure, unspecified heart failure type ([place]) [I50.9] DISCHARGE DIAGNOSIS:  Active Problems:   CHF (congestive heart failure) ([place])  SECONDARY DIAGNOSIS:   Past Medical History:  Diagnosis Date  . AICD (automatic cardioverter/defibrillator) present   . Aortic valve regurgitation   . Atrial fibrillation ([place])   . Barrett's esophagus   . Cancer ([place])    sate III lung  . CHF (congestive heart failure) ([place])   . COPD (chronic obstructive pulmonary disease) ([place])   . Diverticulosis   . Dysrhythmia   . GERD (gastroesophageal reflux disease)   . Gout   . Headache    aura only  . Heart murmur   . Hematuria   . Hernia of abdominal cavity   . History of prosthetic heart valve   . Hyperlipidemia   . Hypertension   . Nodular prostate with urinary obstruction   . Presence of permanent cardiac pacemaker   . Renal insufficiency   . Substance abuse ([place])    alcohol   HOSPITAL COURSE:   Derran is a 77 year old male who presented to the ED with shortness of breath for the last 2 to 3 days.  In the ED, chest x-ray was suggestive of pulmonary edema.  He was also noted to have an AKI.  Patient was placed on BiPAP and was admitted for further management.  He was treated with gentle IV diuresis due to low blood pressures.  Goals of care discussion was held with family, and the decision was made to transition patient to hospice with full comfort care.  He was seen by the palliative care team during hospitalization.  He was transitioned to full  comfort medicines. AICD was turned off prior to discharge. He was discharged to residential hospice.  DISCHARGE CONDITIONS:  Acute hypoxic and hypercarbic respiratory failure Acute on chronic systolic congestive heart failure Elevated LFTs Acute kidney injury on chronic kidney disease stage III Stage III lung cancer History of atrial fibrillation CONSULTS OBTAINED:  CCM Palliative DRUG ALLERGIES:   Allergies  Allergen Reactions  . Prednisone Other (See Comments)    Reaction: "organs shutting down"   DISCHARGE MEDICATIONS:   Allergies as of 11/23/2018      Reactions   Prednisone Other (See Comments)   Reaction: "organs shutting down"      Medication List    STOP taking these medications   aspirin EC 81 MG tablet   CENTRUM ADULTS PO   desonide 0.05 % cream Commonly known as:  DESOWEN   diltiazem 120 MG 24 hr capsule Commonly known as:  CARDIZEM CD   furosemide 40 MG tablet Commonly known as:  LASIX   magnesium oxide 400 MG tablet Commonly known as:  MAG-OX   metoprolol tartrate 50 MG tablet Commonly known as:  LOPRESSOR   naproxen 500 MG tablet Commonly known as:  NAPROSYN   omeprazole 20 MG capsule Commonly known as:  PRILOSEC   senna-docusate 8.6-50 MG tablet Commonly known as:  Senokot-S   SYSTANE OP   tiotropium 18 MCG inhalation capsule Commonly known as:  SPIRIVA  vitamin C 250 MG tablet Commonly known as:  ASCORBIC ACID   warfarin 2 MG tablet Commonly known as:  COUMADIN   warfarin 3 MG tablet Commonly known as:  COUMADIN     TAKE these medications   albuterol (2.5 MG/3ML) 0.083% nebulizer solution Commonly known as:  PROVENTIL Take 2.5 mg by nebulization every 3 (three) hours as needed for wheezing or shortness of breath.   ALPRAZolam 0.25 MG tablet Commonly known as:  XANAX Take 0.25 mg by mouth 2 (two) times daily as needed for anxiety.   diltiazem 60 MG 12 hr capsule Commonly known as:  CARDIZEM SR Take 60 mg by mouth  daily.   finasteride 5 MG tablet Commonly known as:  PROSCAR TAKE 1 TABLET BY MOUTH ONCE DAILY   fluticasone 50 MCG/ACT nasal spray Commonly known as:  FLONASE Place 2 sprays daily into both nostrils.   glycopyrrolate 1 MG tablet Commonly known as:  ROBINUL Take 1 tablet (1 mg total) by mouth every 4 (four) hours as needed (excessive secretions).   ipratropium-albuterol 0.5-2.5 (3) MG/3ML Soln Commonly known as:  DUONEB Take 3 mLs by nebulization 3 (three) times daily.   morphine CONCENTRATE 10 MG/0.5ML Soln concentrated solution Take 0.25 mLs (5 mg total) by mouth every 2 (two) hours as needed for moderate pain (or dyspnea).        DISCHARGE INSTRUCTIONS:  1. Continue comfort meds as needed DIET:  Regular diet DISCHARGE CONDITION:  Serious OXYGEN:  Home Oxygen: Yes.    Oxygen Delivery: 2 liters/min via Patient connected to nasal cannula oxygen DISCHARGE LOCATION:  Residential Hospice   If you experience worsening of your admission symptoms, develop shortness of breath, life threatening emergency, suicidal or homicidal thoughts you must seek medical attention immediately by calling 911 or calling your MD immediately  if symptoms less severe.  You Must read complete instructions/literature along with all the possible adverse reactions/side effects for all the Medicines you take and that have been prescribed to you. Take any new Medicines after you have completely understood and accpet all the possible adverse reactions/side effects.   Please note  You were cared for by a hospitalist during your hospital stay. If you have any questions about your discharge medications or the care you received while you were in the hospital after you are discharged, you can call the unit and asked to speak with the hospitalist on call if the hospitalist that took care of you is not available. Once you are discharged, your primary care physician will handle any further medical issues. Please  note that NO REFILLS for any discharge medications will be authorized once you are discharged, as it is imperative that you return to your primary care physician (or establish a relationship with a primary care physician if you do not have one) for your aftercare needs so that they can reassess your need for medications and monitor your lab values.    On the day of Discharge:  VITAL SIGNS:  Blood pressure 110/69, pulse (!) 59, temperature 98 F (36.7 C), temperature source Oral, resp. rate 20, height 5\' 3"  (1.6 m), weight 71.1 kg, SpO2 90 %. PHYSICAL EXAMINATION:  GENERAL:  77 y.o.-year-old patient lying in the bed with no acute distress.  EYES: Pupils equal, round, reactive to light and accommodation. No scleral icterus. Extraocular muscles intact.  HEENT: Head atraumatic, normocephalic. Oropharynx and nasopharynx clear.  Moist mucous membranes. NECK:  Supple, no jugular venous distention. No thyroid enlargement, no tenderness.  LUNGS: +  Diminished breath sounds throughout all lung fields, no wheezing, rales,rhonchi or crepitation. + Mildly increased work of breathing.  Nasal cannula in place. CARDIOVASCULAR: RRR, S1, S2 normal. No murmurs, rubs, or gallops.  ABDOMEN: Soft, non-tender, non-distended. Bowel sounds present. No organomegaly or mass.  EXTREMITIES: No pedal edema, cyanosis, or clubbing.  NEUROLOGIC: Cranial nerves II through XII are intact. Muscle strength 5/5 in all extremities. Sensation intact. Gait not checked.  PSYCHIATRIC: The patient is alert and oriented x 3.  SKIN: No obvious rash, lesion, or ulcer.  DATA REVIEW:   CBC Recent Labs  Lab 11/20/18 0524  WBC 12.3*  HGB 9.2*  HCT 29.7*  PLT 291    Chemistries  Recent Labs  Lab 11/20/18 0524  NA 136  K 3.6  CL 91*  CO2 39*  GLUCOSE 105*  BUN 50*  CREATININE 1.51*  CALCIUM 8.1*  MG 2.0  AST 26  ALT 15  ALKPHOS 25  BILITOT 1.3*     Microbiology Results  Results for orders placed or performed during  the hospital encounter of 10/20/18  MRSA PCR Screening     Status: None   Collection Time: 10/21/18 11:27 AM  Result Value Ref Range Status   MRSA by PCR NEGATIVE NEGATIVE Final    Comment:        The GeneXpert MRSA Assay (FDA approved for NASAL specimens only), is one component of a comprehensive MRSA colonization surveillance program. It is not intended to diagnose MRSA infection nor to guide or monitor treatment for MRSA infections. Performed at [facility], [address] [place], [place] [address]     RADIOLOGY:  No results found.   Management plans discussed with the patient, family and they are in agreement.  CODE STATUS: DNR   TOTAL TIME TAKING CARE OF THIS PATIENT: 40 minutes.    Berna Spare Houda Brau M.D on 11/23/2018 at 12:43 PM  Between 7am to 6pm - Pager - [phone]  After 6pm go to [email or web address] - Proofreader  [facility]  Office  [phone]  CC: Primary care physician; Guadalupe Maple, MD   Note: This dictation was prepared with Dragon dictation along with smaller phrase technology. Any transcriptional errors that result from this process are unintentional.

## 2018-11-23 NOTE — Care Management Important Message (Signed)
Important Message  Patient Details  Name: Kirk Jones MRN: [id number] Date of Birth: January 10, 1941   Medicare Important Message Given:  Yes    Juliann Pulse A Aivy Akter 11/23/2018, 2:30 PM

## 2018-11-23 NOTE — Progress Notes (Signed)
[place]  [phone] [phone]   Name: Kirk Jones [facility] Date: 11/23/2018 MRN: [id number]  DOB: 1941-11-24  Patient Care Team: Guadalupe Maple, MD as PCP - General (Family Medicine) Murlean Iba, MD (Internal Medicine) Isaias Cowman, MD as Consulting Physician (Cardiology) Christene Lye, MD (General Surgery) Telford Nab, RN as Registered Nurse    REASON FOR CONSULTATION: Palliative Care consult requested for this 76 y.o. male with multiple medical problems including undergoing concurrent chemoradiation treated with carbotaxol, severe aortic valve disease status post valve replacement 2004, CAD status post PCI, ischemic cardiomyopathy status post AICD insertion in 2012 with history of CHF.  PAF on warfarin, history of AAA, and CKD.  Patient was hospitalized on 10/11/2018 to 10/15/18 with syncope with sustained laceration to his head requiring sutures.  Workup revealed SVT and patient was started on amiodarone. He was hospitalized again 10/20/18 to 10/30/18 with shortness of breath and weakness. Workup revealed acute liver failure likely from congestive hepatopathy and acute on chronic kidney disease. Patient was discharged to [facility] where he has been followed by palliative care. Plan was for him to discharge home with hospice. However, patient was readmitted on 11/19/18 with acute on chronic hypoxic respiratory failure initially requiring BIPAP. Palliative care was again consulted to help address goals.   SOCIAL HISTORY:    Patient is married lives at home with his wife.  He has 2 adult daughters from a previous marriage.  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  Patient would also want daughters to be involved in decision-making.  Patient has a  living will.  CODE STATUS: DNR  PAST MEDICAL HISTORY: Past Medical History:  Diagnosis Date  . AICD (automatic cardioverter/defibrillator) present   . Aortic valve regurgitation   . Atrial fibrillation ([place])   . Barrett's esophagus   . Cancer ([place])    sate III lung  . CHF (congestive heart failure) ([place])   . COPD (chronic obstructive pulmonary disease) ([place])   . Diverticulosis   . Dysrhythmia   . GERD (gastroesophageal reflux disease)   . Gout   . Headache    aura only  . Heart murmur   . Hematuria   . Hernia of abdominal cavity   . History of prosthetic heart valve   . Hyperlipidemia   . Hypertension   . Nodular prostate with urinary obstruction   . Presence of permanent cardiac pacemaker   . Renal insufficiency   . Substance abuse ([place])    alcohol    PAST SURGICAL HISTORY:  Past Surgical History:  Procedure Laterality Date  . CARDIAC VALVE REPLACEMENT N/A 2004  . CARPAL TUNNEL RELEASE Right 2015  . CATARACT EXTRACTION Right 2017  . COLONOSCOPY WITH PROPOFOL N/A 2014  . CORONARY ANGIOPLASTY     stints x2  . EYE SURGERY     cross eyed  . FOOT SURGERY Bilateral    [place]  . FRACTURE SURGERY Right    [place]  . INGUINAL HERNIA REPAIR Right 09/16/2017   Procedure: HERNIA REPAIR INGUINAL ADULT;  Surgeon: Christene Lye, MD;  Location: [facility] ORS;  Service: General;  Laterality: Right;  . INSERT / REPLACE / [place]     ICD  . PACEMAKER IMPLANT Left 2013   and ICD  . PORTA CATH INSERTION N/A 09/21/2018  Procedure: PORTA CATH INSERTION;  Surgeon: Algernon Huxley, MD;  Location: [facility];  Service: Cardiovascular;  Laterality: N/A;  . VASECTOMY      HEMATOLOGY/ONCOLOGY HISTORY:    Squamous cell carcinoma of right lung ([place])   09/14/2018 Cancer Staging    Staging form: Lung, AJCC 8th Edition - Clinical stage from 09/14/2018: Stage IIIA (cT3, cN1, cM0) - Signed by Sindy Guadeloupe, MD on 09/16/2018    09/16/2018 Initial Diagnosis     Squamous cell carcinoma of right lung ([place])    09/27/2018 -  Chemotherapy    The patient had palonosetron (ALOXI) injection 0.25 mg, 0.25 mg, Intravenous,  Once, 3 of 4 cycles Administration: 0.25 mg (09/28/2018), 0.25 mg (10/05/2018), 0.25 mg (10/19/2018) CARBOplatin (PARAPLATIN) 180 mg in sodium chloride 0.9 % 250 mL chemo infusion, 180 mg (100 % of original dose 182.6 mg), Intravenous,  Once, 3 of 4 cycles Dose modification:   (original dose 182.6 mg, Cycle 1) Administration: 180 mg (09/28/2018), 160 mg (10/05/2018), 110 mg (10/19/2018) PACLitaxel (TAXOL) 84 mg in sodium chloride 0.9 % 250 mL chemo infusion (</= 80mg /m2), 45 mg/m2 = 84 mg, Intravenous,  Once, 3 of 4 cycles Administration: 84 mg (09/28/2018), 84 mg (10/05/2018), 84 mg (10/19/2018)  for chemotherapy treatment.      ALLERGIES:  is allergic to prednisone.  MEDICATIONS:  Current Facility-Administered Medications  Medication Dose Route Frequency Provider Last Rate Last Dose  . ALPRAZolam (XANAX) tablet 0.25 mg  0.25 mg Oral TID PRN Pickenpack-Cousar, Athena N, NP      . antiseptic oral rinse (BIOTENE) solution 15 mL  15 mL Topical PRN Pickenpack-Cousar, Athena N, NP      . diltiazem (CARDIZEM CD) 24 hr capsule 120 mg  120 mg Oral Daily Wilhelmina Mcardle, MD   120 mg at 11/23/18 1119  . docusate sodium (COLACE) capsule 100 mg  100 mg Oral BID PRN Wilhelmina Mcardle, MD      . finasteride (PROSCAR) tablet 5 mg  5 mg Oral Daily Sedalia Muta, MD   5 mg at 11/23/18 1119  . glycopyrrolate (ROBINUL) tablet 1 mg  1 mg Oral Q4H PRN Pickenpack-Cousar, Athena N, NP       Or  . glycopyrrolate (ROBINUL) injection 0.2 mg  0.2 mg Intravenous Q4H PRN Pickenpack-Cousar, Athena N, NP      . ipratropium-albuterol (DUONEB) 0.5-2.5 (3) MG/3ML nebulizer solution 3 mL  3 mL Nebulization TID Vaughan Basta, MD   3 mL at 11/23/18 0749  . morphine 2 MG/ML injection 2-4 mg  2-4 mg Intravenous Q1H PRN Wilhelmina Mcardle, MD   2 mg at 11/23/18 1218    . morphine CONCENTRATE 10 MG/0.5ML oral solution 5 mg  5 mg Oral Q2H PRN Pickenpack-Cousar, Carlena Sax, NP   5 mg at 11/20/18 1924   Or  . morphine CONCENTRATE 10 MG/0.5ML oral solution 5 mg  5 mg Sublingual Q2H PRN Pickenpack-Cousar, Athena N, NP      . ondansetron (ZOFRAN) injection 4 mg  4 mg Intravenous Q6H PRN Sedalia Muta, MD      . polyvinyl alcohol (LIQUIFILM TEARS) 1.4 % ophthalmic solution 1 drop  1 drop Both Eyes QID PRN Pickenpack-Cousar, Athena N, NP      . sodium chloride flush (NS) 0.9 % injection 3 mL  3 mL Intravenous Q12H Sedalia Muta, MD   10 mL at 11/23/18 1120   Facility-Administered Medications Ordered in Other Encounters  Medication Dose Route Frequency Provider Last Rate  Last Dose  . 0.9 %  sodium chloride infusion   Intravenous Continuous Ascencion Dike, PA-C        VITAL SIGNS: BP 110/69 (BP Location: Right Arm)   Pulse (!) 59   Temp 98 F (36.7 C) (Oral)   Resp 20   Ht 5\' 3"  (1.6 m)   Wt 156 lb 12 oz (71.1 kg)   SpO2 90%   BMI 27.77 kg/m  Filed Weights   11/19/18 1802 11/19/18 2230 11/20/18 0500  Weight: 153 lb (69.4 kg) 156 lb 12 oz (71.1 kg) 156 lb 12 oz (71.1 kg)    Estimated body mass index is 27.77 kg/m as calculated from the following:   Height as of this encounter: 5\' 3"  (1.6 m).   Weight as of this encounter: 156 lb 12 oz (71.1 kg).  LABS: CBC:    Component Value Date/Time   WBC 12.3 (H) 11/20/2018 0524   HGB 9.2 (L) 11/20/2018 0524   HGB 13.6 11/10/2017 1328   HCT 29.7 (L) 11/20/2018 0524   HCT 40.8 11/10/2017 1328   PLT 291 11/20/2018 0524   PLT 255 11/10/2017 1328   MCV 100.3 (H) 11/20/2018 0524   MCV 103 (H) 11/10/2017 1328   MCV 106 (H) 11/20/2014 0549   NEUTROABS 9.4 (H) 11/19/2018 1822   NEUTROABS 5.1 11/10/2017 1328   NEUTROABS 4.7 11/20/2014 0549   LYMPHSABS 0.7 11/19/2018 1822   LYMPHSABS 0.8 11/10/2017 1328   LYMPHSABS 1.0 11/20/2014 0549   MONOABS 1.0 11/19/2018 1822   MONOABS 1.0 11/20/2014 0549   EOSABS 0.0  11/19/2018 1822   EOSABS 0.1 11/10/2017 1328   EOSABS 0.2 11/20/2014 0549   BASOSABS 0.0 11/19/2018 1822   BASOSABS 0.1 11/10/2017 1328   BASOSABS 0.1 11/20/2014 0549   Comprehensive Metabolic Panel:    Component Value Date/Time   NA 136 11/20/2018 0524   NA 136 05/08/2018 1507   NA 140 11/18/2014 0514   K 3.6 11/20/2018 0524   K 4.2 11/18/2014 0514   CL 91 (L) 11/20/2018 0524   CL 106 11/18/2014 0514   CO2 39 (H) 11/20/2018 0524   CO2 27 11/18/2014 0514   BUN 50 (H) 11/20/2018 0524   BUN 21 05/08/2018 1507   BUN 17 11/18/2014 0514   CREATININE 1.51 (H) 11/20/2018 0524   CREATININE 1.35 (H) 11/18/2014 0514   GLUCOSE 105 (H) 11/20/2018 0524   GLUCOSE 93 11/18/2014 0514   CALCIUM 8.1 (L) 11/20/2018 0524   CALCIUM 8.5 11/18/2014 0514   AST 26 11/20/2018 0524   AST 40 (H) 05/08/2018 1122   AST 68 (H) 11/16/2014 0031   ALT 15 11/20/2018 0524   ALT 23 05/08/2018 1122   ALT 102 (H) 11/16/2014 0031   ALKPHOS 74 11/20/2018 0524   ALKPHOS 614 (H) 11/16/2014 0031   BILITOT 1.3 (H) 11/20/2018 0524   BILITOT 0.5 10/24/2017 0910   BILITOT 0.6 11/16/2014 0031   PROT 4.9 (L) 11/20/2018 0524   PROT 6.7 10/24/2017 0910   PROT 7.2 11/16/2014 0031   ALBUMIN 1.9 (L) 11/20/2018 0524   ALBUMIN 4.4 10/24/2017 0910   ALBUMIN 3.1 (L) 11/16/2014 0031    RADIOGRAPHIC STUDIES: Dg Chest Portable 1 View  Result Date: 11/19/2018 CLINICAL DATA:  Shortness of breath EXAM: PORTABLE CHEST 1 VIEW COMPARISON:  10/21/2018, CT 10/21/2018, radiograph 10/11/2018 FINDINGS: Left-sided pacing device as before. Post sternotomy changes. Right-sided central venous port tip over the SVC. Increased bilateral pleural effusions, right greater than left. Worsening airspace disease at  both bases. Enlarged cardiomediastinal silhouette with increased vascular congestion and background edema. Aortic atherosclerosis. IMPRESSION: 1. Increasing bilateral right greater than left pleural effusions with worsening airspace  disease at both bases. 2. Enlarged cardiomediastinal silhouette with increased vascular congestion and continued pulmonary edema. Electronically Signed   By: Donavan Foil M.D.   On: 11/19/2018 18:48    PERFORMANCE STATUS (ECOG) : 4 - Bedbound  Review of Systems As noted above. Otherwise, a complete review of systems is negative.  Physical Exam General: NAD, frail appearing, thin Cardiovascular: regular rate and rhythm Pulmonary: poor air movement without wheeze, on O2 Abdomen: soft, nontender, + bowel sounds Extremities: no edema Skin: no rashes Neurological: Weakness but otherwise nonfocal  IMPRESSION: Patient continues on comfort care. Family at bedside.   He reports some dyspnea today. He will receive dose of morphine elixir per RN. Patient is pending [facility]. We discussed deactivation of his AICD and both patient and family were in agreement. They confirm plan for comfort care only.   Case discussed with SW and hospice liaison.   PLAN: Supportive care Home with hospice Deactivation of AICD   Time Total: 15 minutes  Visit consisted of counseling and education dealing with the complex and emotionally intense issues of symptom management and palliative care in the setting of serious and potentially life-threatening illness.Greater than 50%  of this time was spent counseling and coordinating care related to the above assessment and plan.  Signed by: Altha Harm, PhD, NP-C [phone] (Work Cell)

## 2018-11-23 NOTE — Clinical Social Work Note (Signed)
Per Santiago Glad, hospice liaison there is a bed available for patient today. Patient will transport via EMS. Santiago Glad will call report and set up transport.   [place], [place]

## 2018-11-29 DEATH — deceased

## 2018-12-11 ENCOUNTER — Telehealth: Payer: Self-pay | Admitting: Family Medicine

## 2018-12-11 NOTE — Telephone Encounter (Signed)
Called patient's home phone and the lady that answered states [name] [facility] passed away the 12-Mar-2023 after Christmas 2019.  Please notate this in the patient's chart.

## 2018-12-11 NOTE — Telephone Encounter (Signed)
Please Maahi Lannan this patient as deceased

## 2018-12-12 NOTE — Telephone Encounter (Signed)
Done

## 2019-01-31 ENCOUNTER — Encounter (INDEPENDENT_AMBULATORY_CARE_PROVIDER_SITE_OTHER): Payer: Medicare Other | Admitting: Ophthalmology

## 2020-06-25 IMAGING — CT NM PET TUM IMG INITIAL (PI) SKULL BASE T - THIGH
1 of 9 series · 1 of 25 positions shown · non-contrast
Comparison: CT chest 08/21/2018

CLINICAL DATA: Initial treatment strategy for right lower lobe
mass.

EXAM:
NUCLEAR MEDICINE PET SKULL BASE TO THIGH
TECHNIQUE: 8.8 mCi F-18 FDG was injected intravenously. Full-ring PET imaging
was performed from the skull base to thigh after the radiotracer. CT
data was obtained and used for attenuation correction and anatomic
localization.
Fasting blood glucose: 89 mg/dl

[Series 3: ct wb 5.0 b30f · axial · 5.0mm · 0.98mm/px · 1 of 290 slices shown]
[im 290/290  brain]
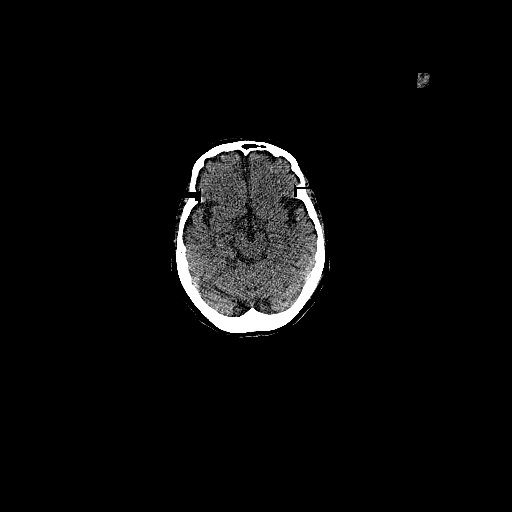

[1 of 25 positions shown; findings below may reference images not displayed]

FINDINGS: Mediastinal blood pool activity: SUV max

NECK: No significant abnormal hypermetabolic activity in this
region.

Incidental CT findings: There is atherosclerotic calcification of
the cavernous carotid arteries bilaterally. Bilateral common carotid
atherosclerotic calcification. 1.9 cm calcified right thyroid nodule
without accentuated metabolic activity.

CHEST: [DATE] by 6.1 cm right lower lobe mass has maximum SUV of 15.0,
along with central low activity compatible with central necrosis.

Subcarinal lymph node measuring 1.0 cm in short axis on image 94/3
has a maximum SUV of 3.7.

Small to moderate-sized right pleural effusion with low-grade
activity, typical SUV about 1.6.

Centrilobular emphysema.

Incidental CT findings: Coronary, aortic arch, and branch vessel
atherosclerotic vascular disease. Ascending aortic aneurysm 4.8 cm
in diameter.

Single lead AICD.

Mild cardiomegaly.

Dense calcification of the aortic valve.  Calcified mitral valve.

Regional muscular atrophy in the chest. Trace left pleural effusion.

ABDOMEN/PELVIS: Subtle accentuated metabolic activity upper
pancreatic head anteriorly with maximum SUV in this vicinity
approximately 3.4, whereas other portions of the pancreas have an
SUV more typically around 2.0. Comparing this area, there is a
suggestion of some faint punctate calcifications for example on
images 153-154 of series 3. Faint calcification in this vicinity
also appears to be present on the chest CT from 08/21/2018 but not
on the CT abdomen from 02/15/2007.

Incidental CT findings: Aortoiliac atherosclerotic vascular disease.
Left renal scarring. Hypodense and photopenic lesion of the right
kidney lower pole compatible with cyst. Infrarenal abdominal aortic
ectasia up to 2.4 cm in diameter on image 170/3. Descending and
sigmoid colon diverticulosis. Stranding along the paracolic gutters.

SKELETON: No significant abnormal hypermetabolic activity in this
region.

Incidental CT findings: Deformity in the right proximal femur. Fatty
replacement of much of the upper right vastus lateralis muscle.
Lumbar spondylosis and degenerative disc disease. Comment old
bilateral rib deformities from prior fractures. Probable
osteochondroma of the left lateral maxilla on image [DATE].
IMPRESSION: 1. 6.5 cm right lower lobe mass is highly hypermetabolic with
maximum SUV of 15.0, and has central necrosis.
2. Mildly hypermetabolic subcarinal lymph node, 1.0 cm in short axis
with maximum SUV 3.7, possibly from metastatic involvement.
3. Small to moderate-sized right pleural effusion. This has
low-grade activity with maximum SUV of 1.6 suggesting an exudative
effusion but not specific for malignancy. No hypermetabolic pleural
nodularity observed.
4. Subtle accentuated metabolic activity in the upper pancreatic
head anteriorly with some very faint associated calcifications. A
neoplastic lesion in this vicinity is not excluded. Presuming that
the patient's AICD is not MRI compatible, pancreatic protocol CT of
the abdomen with and without contrast would be recommended for
further characterization.
5. Ascending aortic aneurysm 4.8 cm in diameter. Ascending thoracic
aortic aneurysm. Recommend semi-annual imaging followup by CTA or
MRA and referral to cardiothoracic surgery if not already obtained.
This recommendation follows 7454
ACCF/AHA/AATS/ACR/ASA/SCA/MOONKEEY/KUSIAIT/ZAVALZA/KUQI Guidelines for the
Diagnosis and Management of Patients With Thoracic Aortic Disease.
Circulation. 7454; 121: [id number]
6. Other imaging findings of potential clinical significance: Aortic
Atherosclerosis (EMQOT-CS7.7). Emphysema (EMQOT-1FF.2). Aortic
aneurysm NOS (EMQOT-NXS.2). Mild cardiomegaly. Aortic and mitral
valve calcification. Trace left pleural effusion. Left renal
scarring. Descending and sigmoid colon diverticulosis. Remote
posttraumatic deformity of the right proximal femoral shaft.
